# Patient Record
Sex: Male | Born: 1955
Health system: Southern US, Community
[De-identification: ages and names within clinical notes are randomized; demographics above are authoritative.]

## PROBLEM LIST (undated history)

## (undated) DIAGNOSIS — I4892 Unspecified atrial flutter: Secondary | ICD-10-CM

## (undated) DIAGNOSIS — N2581 Secondary hyperparathyroidism of renal origin: Secondary | ICD-10-CM

## (undated) DIAGNOSIS — I4891 Unspecified atrial fibrillation: Secondary | ICD-10-CM

## (undated) DIAGNOSIS — N186 End stage renal disease: Secondary | ICD-10-CM

## (undated) DIAGNOSIS — E785 Hyperlipidemia, unspecified: Secondary | ICD-10-CM

## (undated) DIAGNOSIS — I071 Rheumatic tricuspid insufficiency: Secondary | ICD-10-CM

## (undated) DIAGNOSIS — I10 Essential (primary) hypertension: Secondary | ICD-10-CM

## (undated) DIAGNOSIS — I70223 Atherosclerosis of native arteries of extremities with rest pain, bilateral legs: Secondary | ICD-10-CM

## (undated) DIAGNOSIS — I480 Paroxysmal atrial fibrillation: Secondary | ICD-10-CM

## (undated) DIAGNOSIS — I00 Rheumatic fever without heart involvement: Secondary | ICD-10-CM

## (undated) DIAGNOSIS — K2270 Barrett's esophagus without dysplasia: Secondary | ICD-10-CM

## (undated) DIAGNOSIS — Z992 Dependence on renal dialysis: Secondary | ICD-10-CM

## (undated) DIAGNOSIS — N189 Chronic kidney disease, unspecified: Secondary | ICD-10-CM

## (undated) DIAGNOSIS — N051 Unspecified nephritic syndrome with focal and segmental glomerular lesions: Secondary | ICD-10-CM

## (undated) DIAGNOSIS — I739 Peripheral vascular disease, unspecified: Secondary | ICD-10-CM

## (undated) DIAGNOSIS — Z89611 Acquired absence of right leg above knee: Secondary | ICD-10-CM

## (undated) DIAGNOSIS — I340 Nonrheumatic mitral (valve) insufficiency: Secondary | ICD-10-CM

## (undated) HISTORY — PX: AV FISTULA PLACEMENT: SHX1204

## (undated) HISTORY — PX: OTHER SURGICAL HISTORY: SHX169

## (undated) HISTORY — PX: DG AV DIALYSIS GRAFT DECLOT OR: HXRAD813

## (undated) HISTORY — PX: FLEXIBLE BRONCHOSCOPY: SUR164

---

## 1983-08-10 HISTORY — PX: KIDNEY TRANSPLANT: SHX239

## 2004-12-30 ENCOUNTER — Ambulatory Visit: Payer: Self-pay

## 2005-01-05 ENCOUNTER — Ambulatory Visit: Payer: Self-pay | Admitting: Nephrology

## 2005-03-29 ENCOUNTER — Emergency Department: Payer: Self-pay | Admitting: Emergency Medicine

## 2005-04-19 ENCOUNTER — Ambulatory Visit: Payer: Self-pay | Admitting: Nephrology

## 2005-04-22 ENCOUNTER — Ambulatory Visit: Payer: Self-pay | Admitting: Nephrology

## 2005-11-30 ENCOUNTER — Ambulatory Visit: Payer: Self-pay | Admitting: Vascular Surgery

## 2006-03-07 ENCOUNTER — Ambulatory Visit: Payer: Self-pay | Admitting: Vascular Surgery

## 2006-03-07 IMAGING — XA DG OUTSIDE FILMS CHEST
6 series · 15 of 24 positions shown · non-contrast
Comparison: none

[Series 1: run · 3 of 18 slices shown (1 of 6)]
[im 1/18]
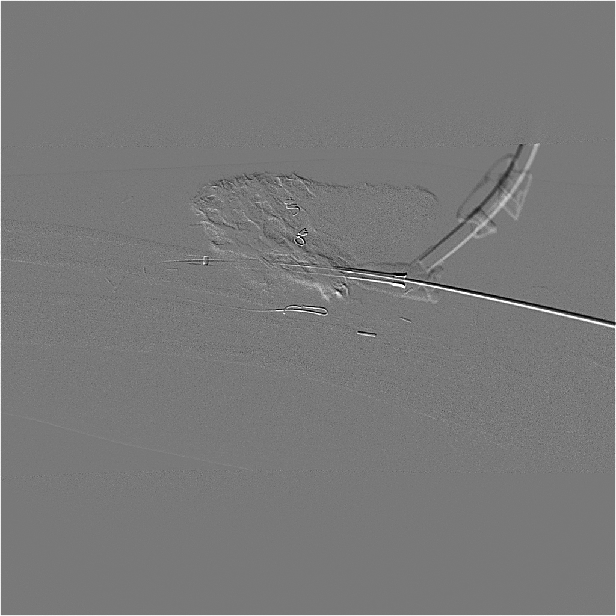
[im 9/18]
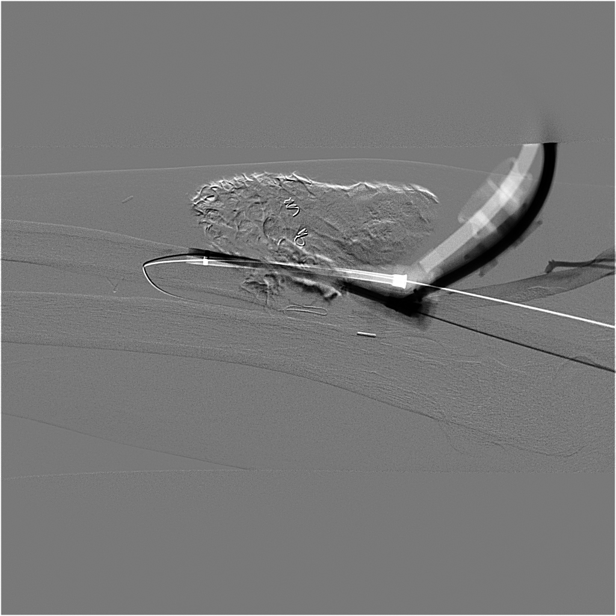
[im 18/18]
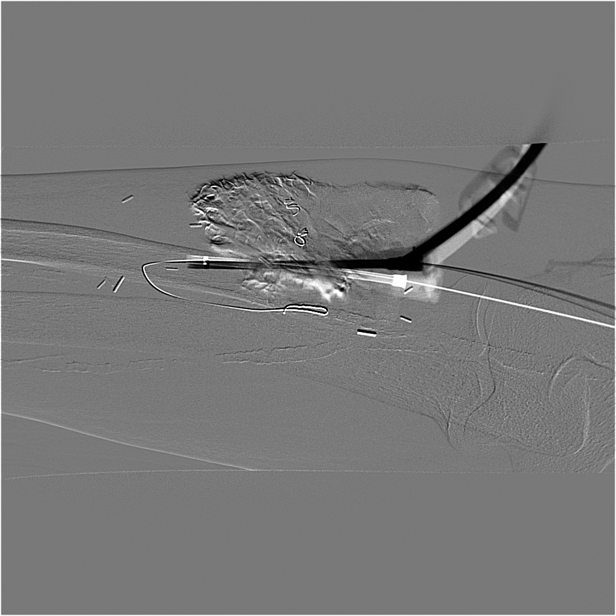

[Series 2: run · 3 of 19 slices shown (2 of 6)]
[im 1/19]
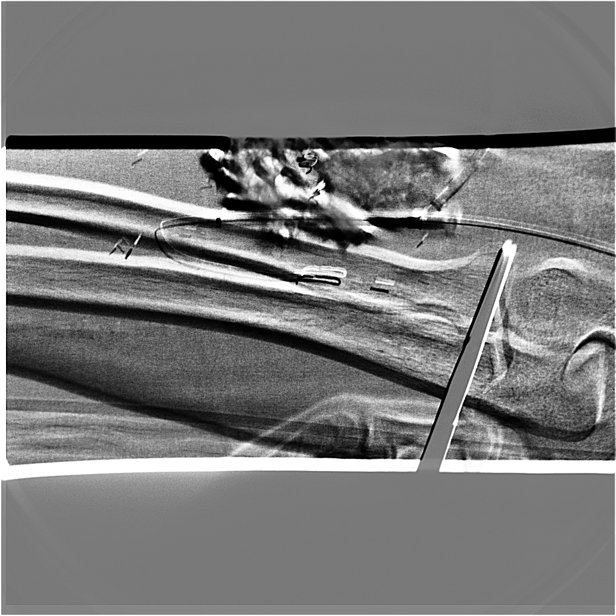
[im 13/19]
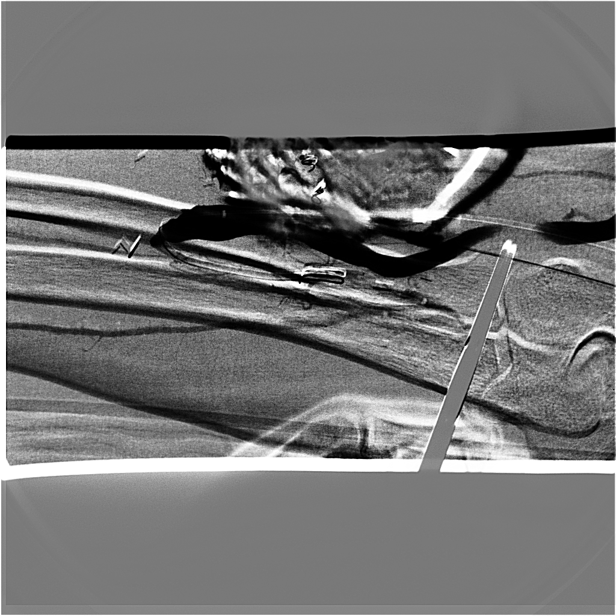
[im 19/19]
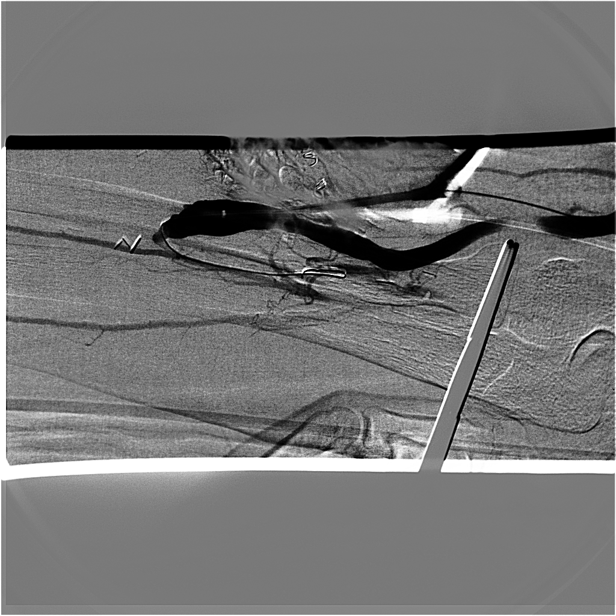

[Series 3: run · 1 of 9 slices shown (3 of 6)]
[im 9/9]
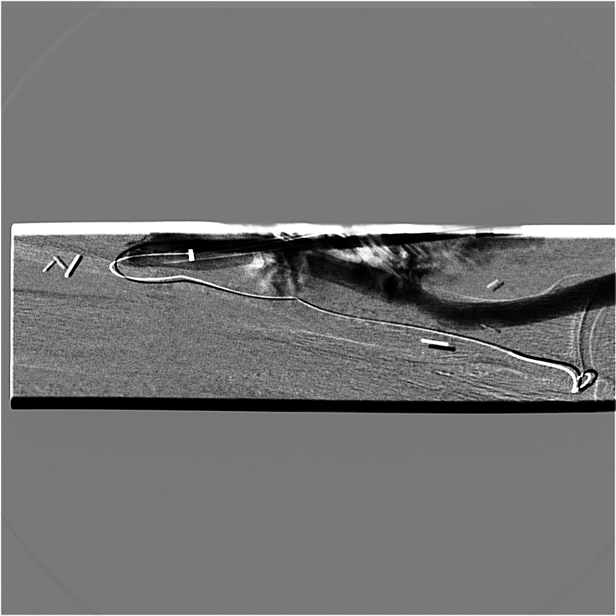

[Series 4: run · 1 of 10 slices shown (4 of 6)]
[im 10/10]
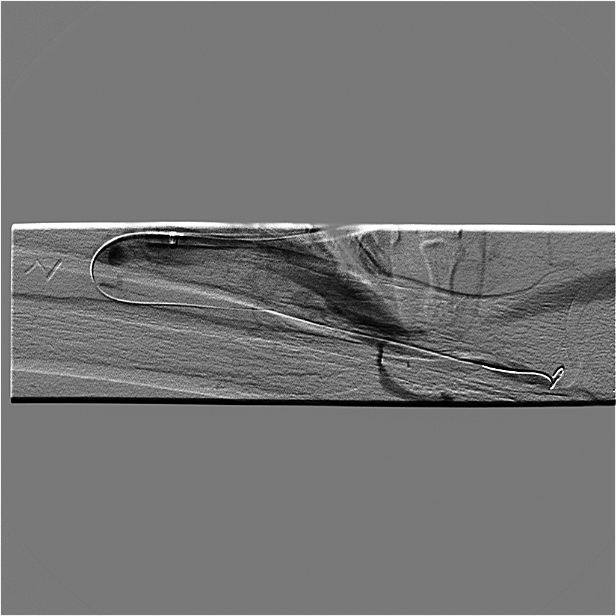

[Series 5: run · 2 of 14 slices shown (5 of 6)]
[im 1/14]
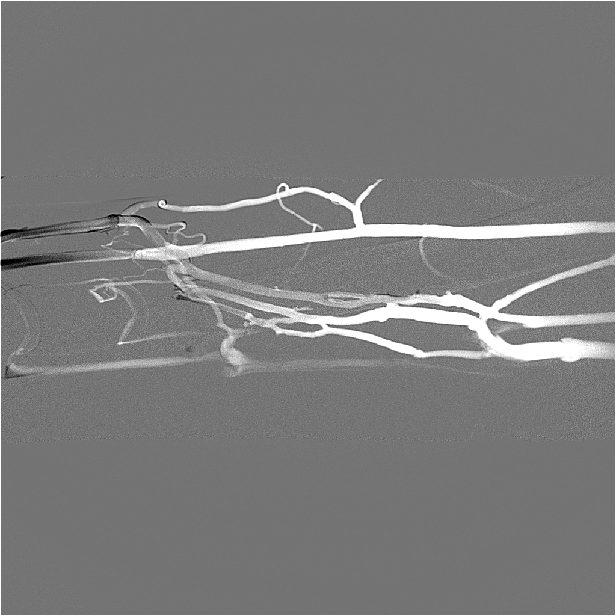
[im 14/14]
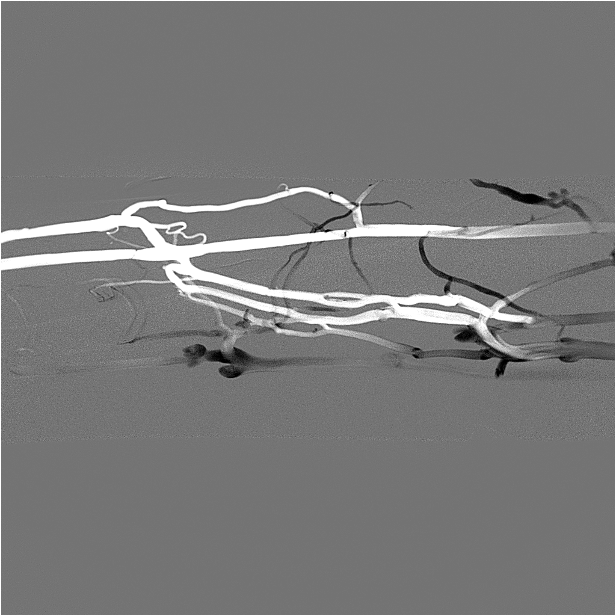

[Series 6: run · 5 of 33 slices shown (6 of 6)]
[im 1/33]
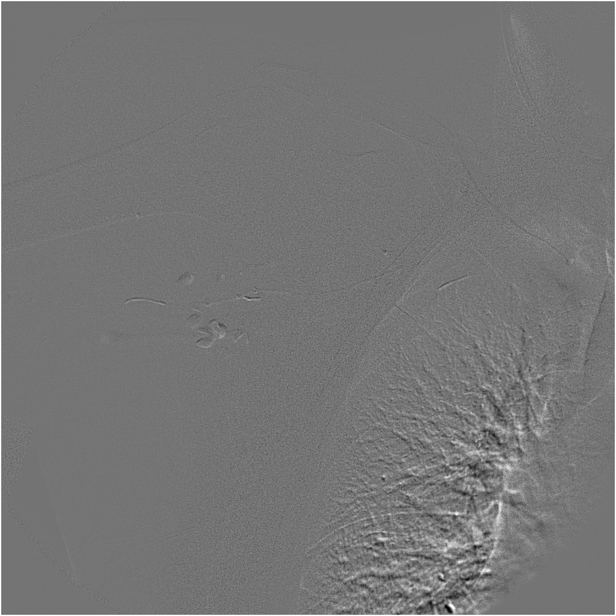
[im 10/33]
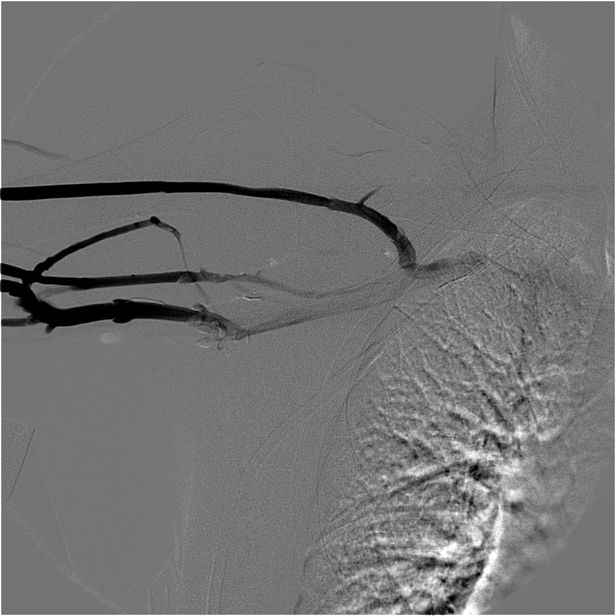
[im 19/33]
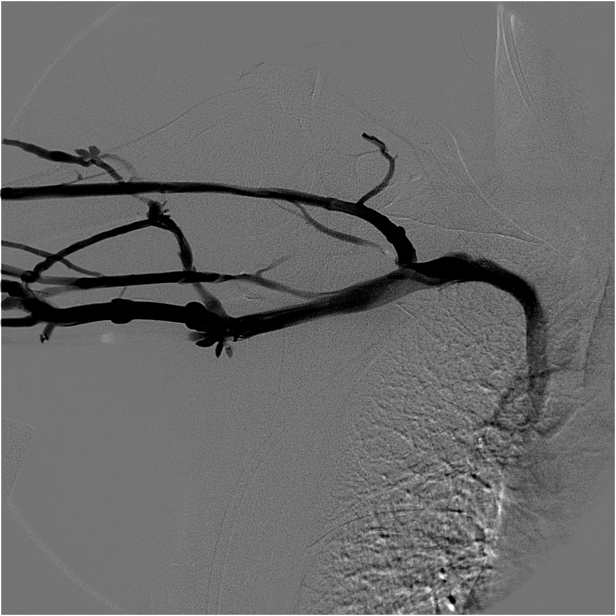
[im 23/33]
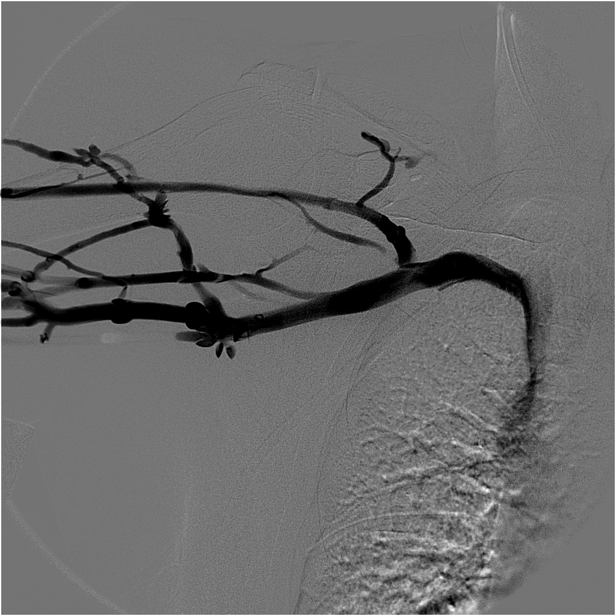
[im 33/33]
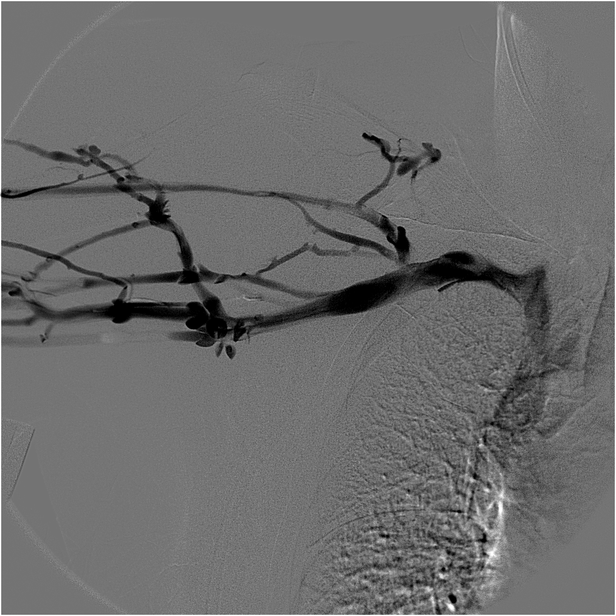

[15 of 24 positions shown; findings below may reference images not displayed]

**** An original report or order could not be provided from the [HOSPITAL] [facility] ****

## 2007-02-20 ENCOUNTER — Ambulatory Visit: Payer: Self-pay | Admitting: Vascular Surgery

## 2007-02-20 IMAGING — XA DG OUTSIDE FILMS CHEST
12 of 13 series · 15 of 24 positions shown · non-contrast
Comparison: none

[Series 1: run · 2 of 38 slices shown (1 of 12)]
[im 1/38]
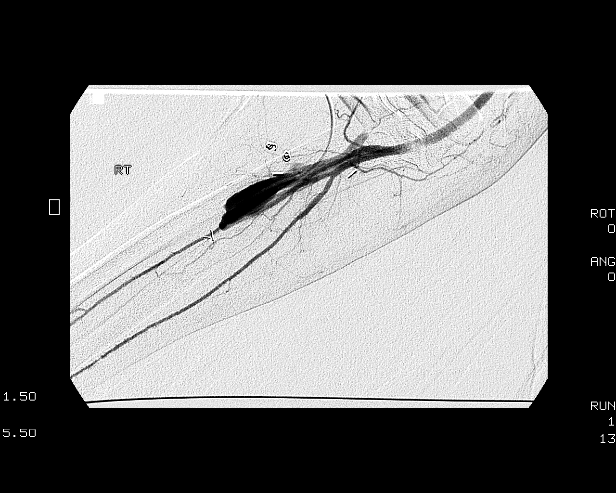
[im 25/38]
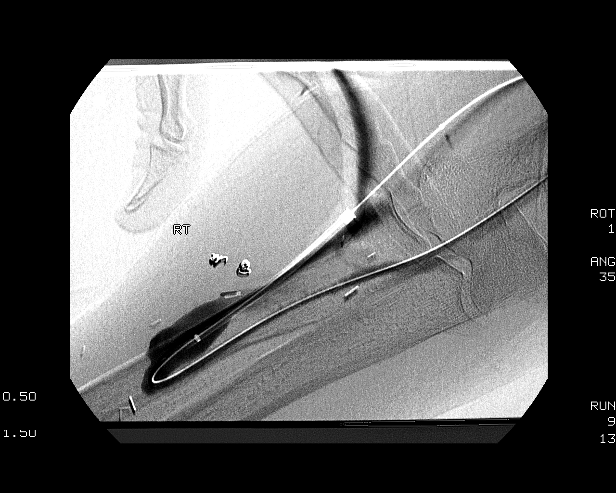

[Series 1: run · 1 of 13 slices shown (2 of 12)]
[im 1/13]
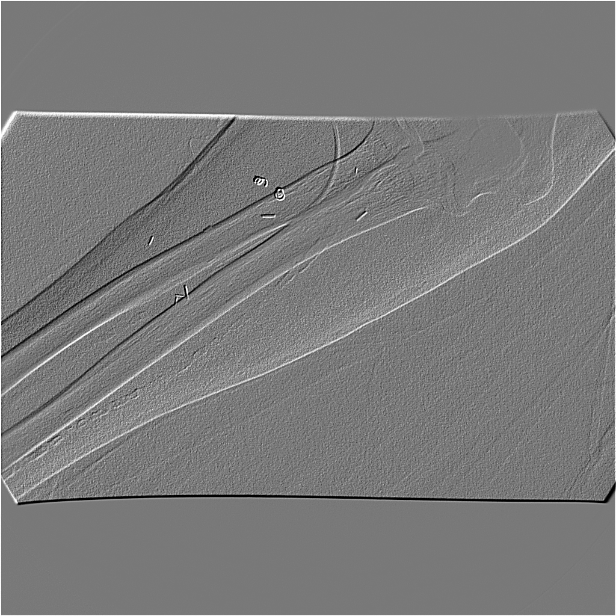

[Series 2: run · 1 of 19 slices shown (3 of 12)]
[im 1/19]
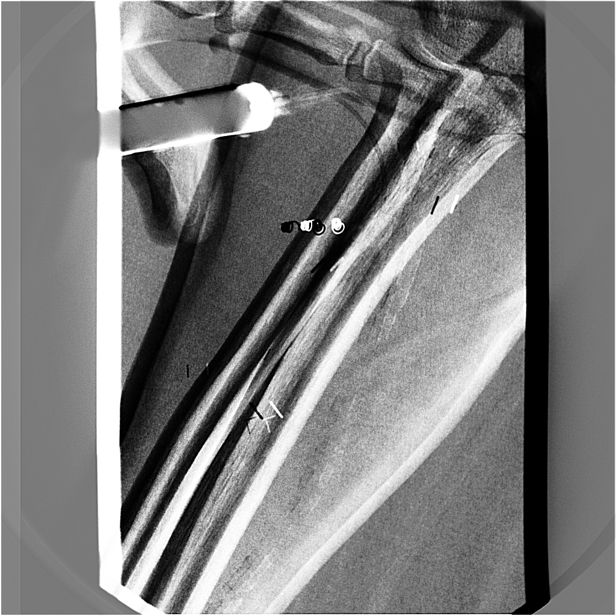

[Series 3: run · 1 of 12 slices shown (4 of 12)]
[im 12/12]
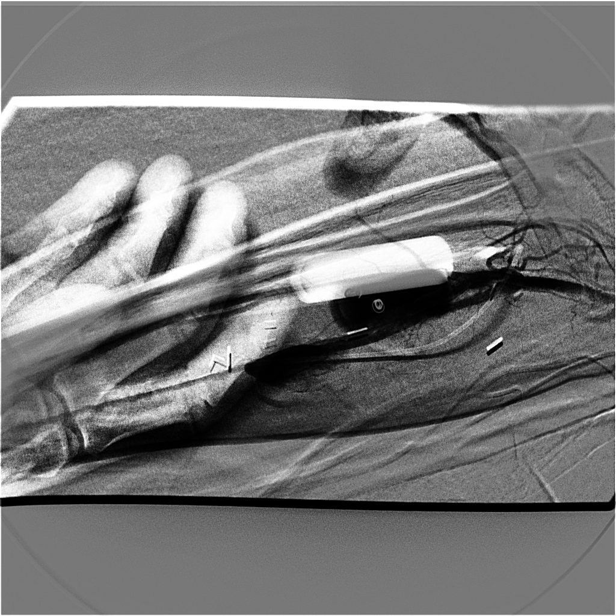

[Series 4: run · 1 of 15 slices shown (5 of 12)]
[im 1/15]
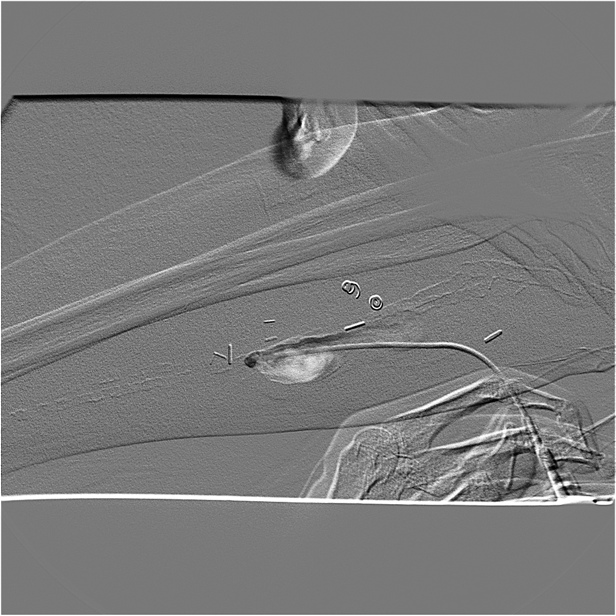

[Series 5: run · 1 of 16 slices shown (6 of 12)]
[im 1/16]
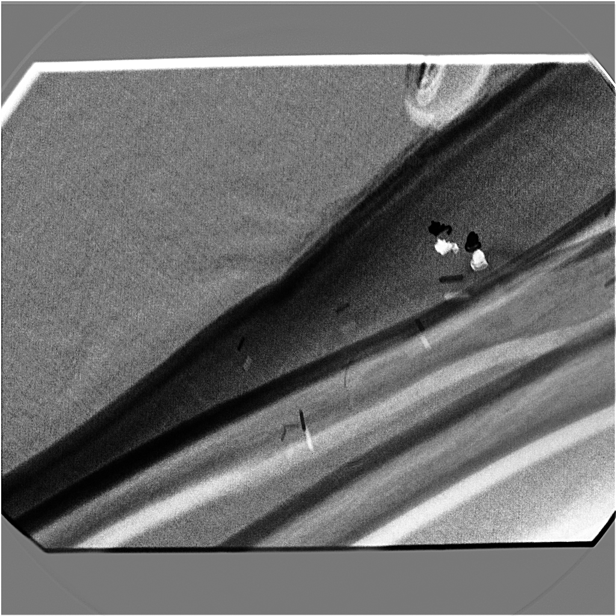

[Series 6: run · 2 of 12 slices shown (7 of 12)]
[im 1/12]
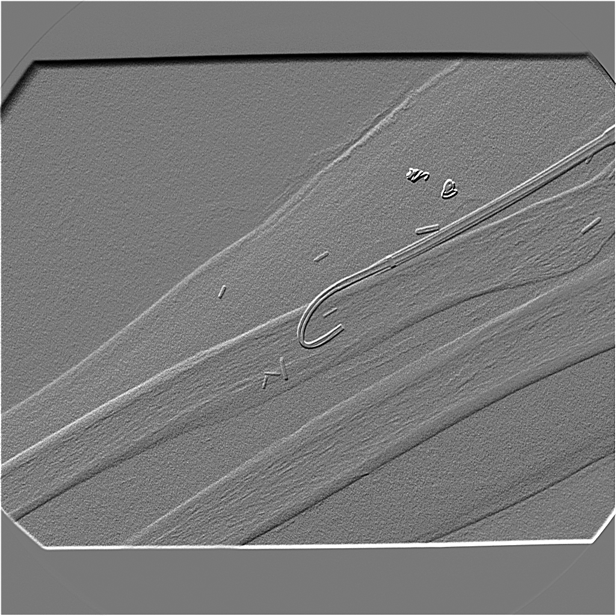
[im 12/12]
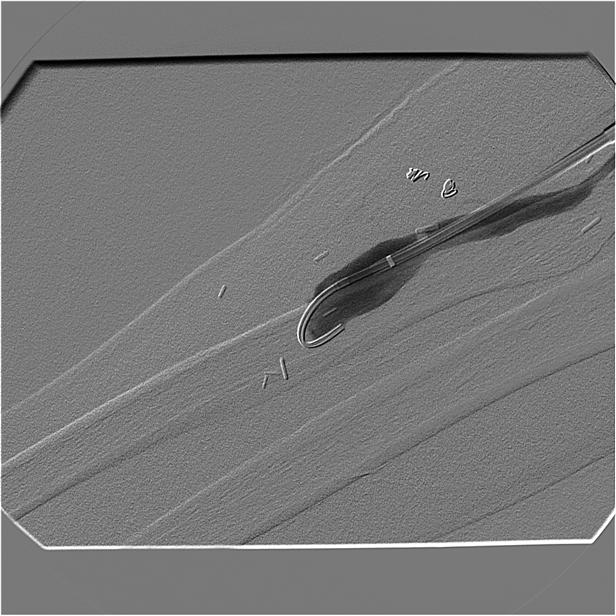

[Series 8: run · 1 of 1 slices shown (8 of 12)]
[im 1/1]
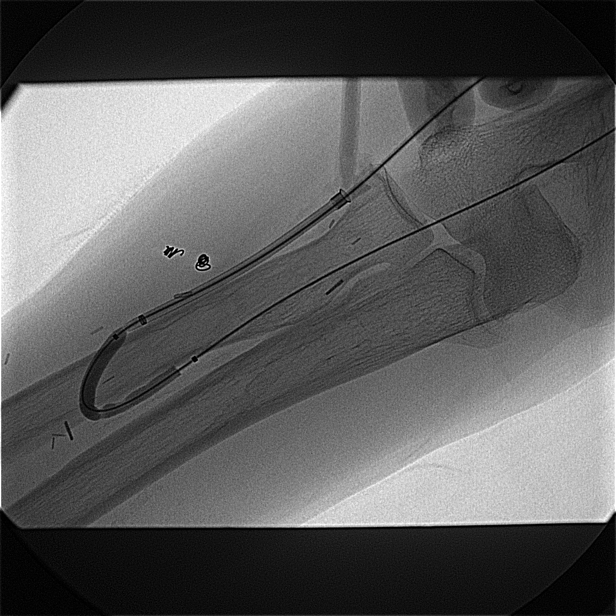

[Series 9: run · 1 of 13 slices shown (9 of 12)]
[im 1/13]
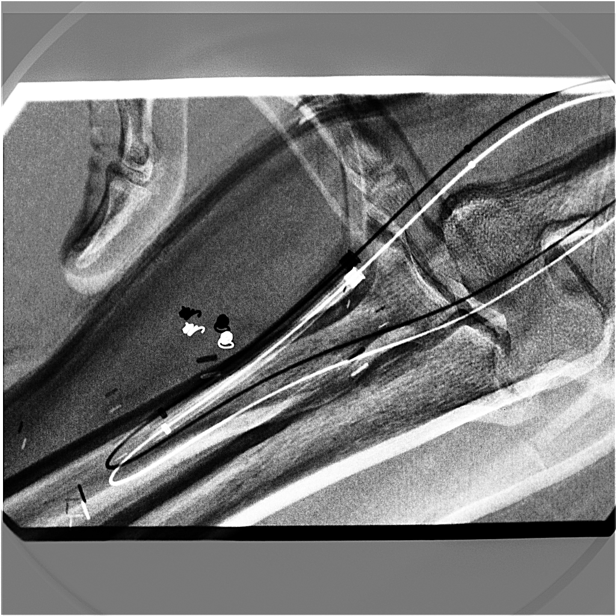

[Series 10: run · 1 of 12 slices shown (10 of 12)]
[im 1/12]
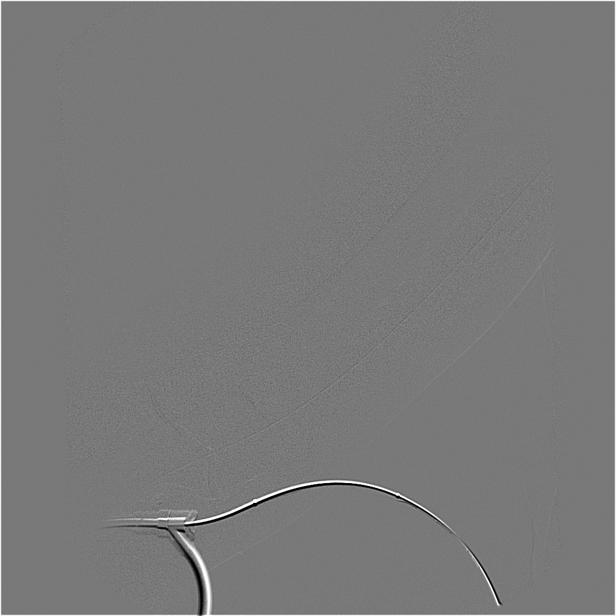

[Series 11: run · 2 of 15 slices shown (11 of 12)]
[im 1/15]
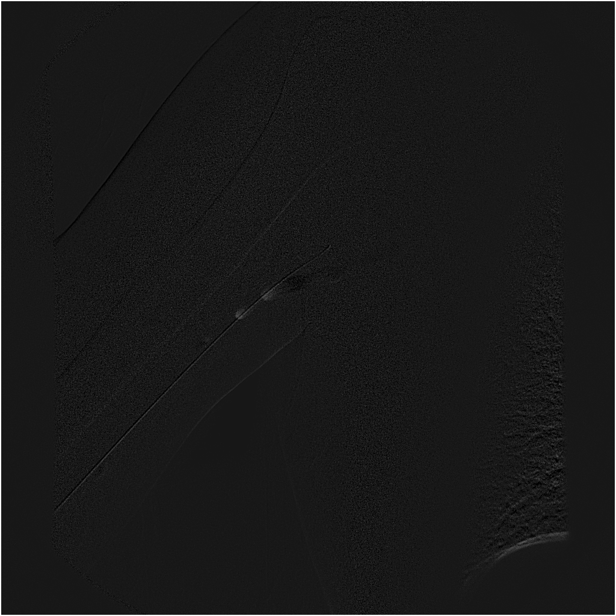
[im 15/15]
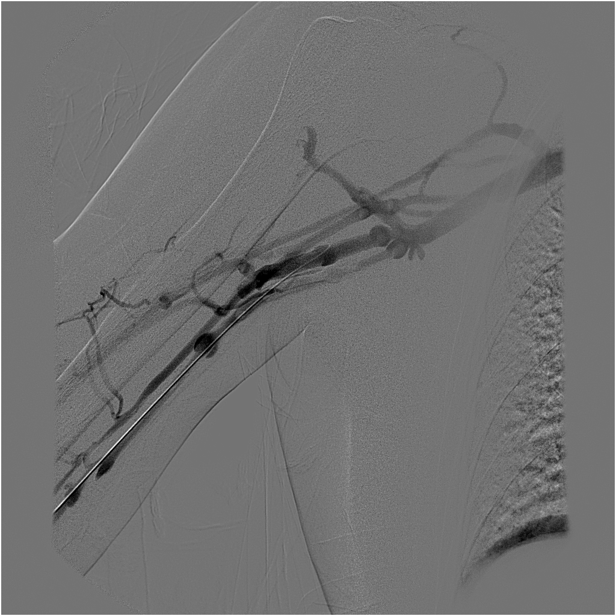

[Series 12: run · 1 of 17 slices shown (12 of 12)]
[im 17/17]
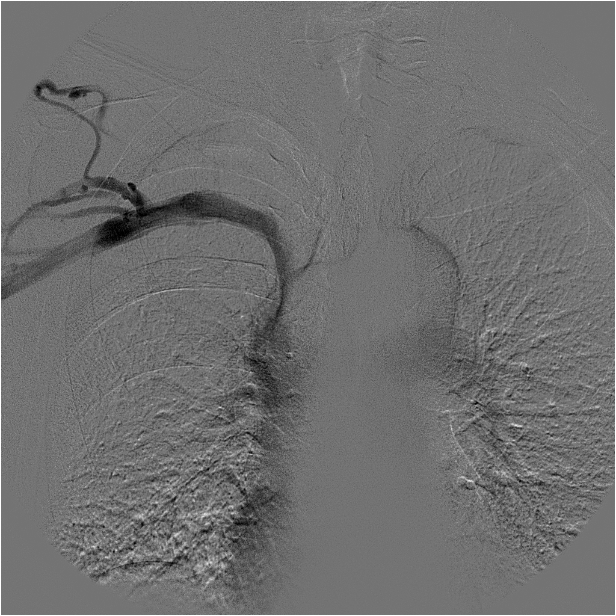

[15 of 24 positions shown; findings below may reference images not displayed]

**** An original report or order could not be provided from the [HOSPITAL] [facility] ****

## 2007-02-22 ENCOUNTER — Ambulatory Visit: Payer: Self-pay | Admitting: Vascular Surgery

## 2007-02-24 ENCOUNTER — Other Ambulatory Visit: Payer: Self-pay

## 2007-02-24 ENCOUNTER — Emergency Department: Payer: Self-pay | Admitting: Emergency Medicine

## 2007-02-24 IMAGING — CT CT HEAD WITHOUT CONTRAST
2 series · 16 of 30 positions shown, 20 images · non-contrast
Comparison: none

REASON FOR EXAM: Headache
COMMENTS:

[Series 2: without · axial · non-contrast · 0.40mm/px · z∈[+127,+252]mm · 13 of 31 slices shown, 17 images]
[im 3/31  brain]
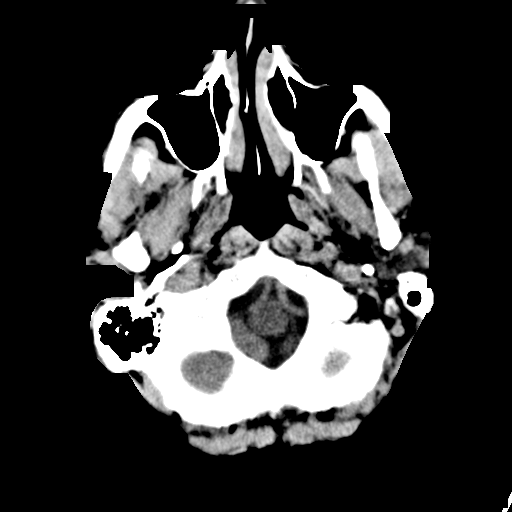
[im 3/31  bone]
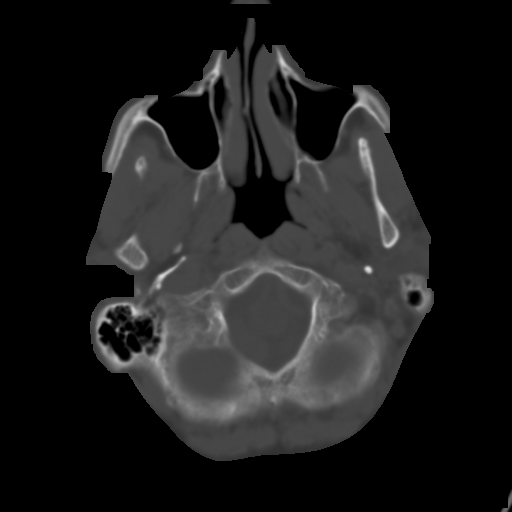
[im 5/31  brain]
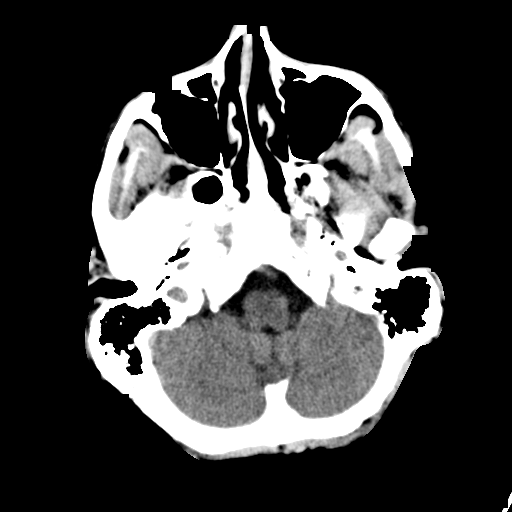
[im 7/31  brain]
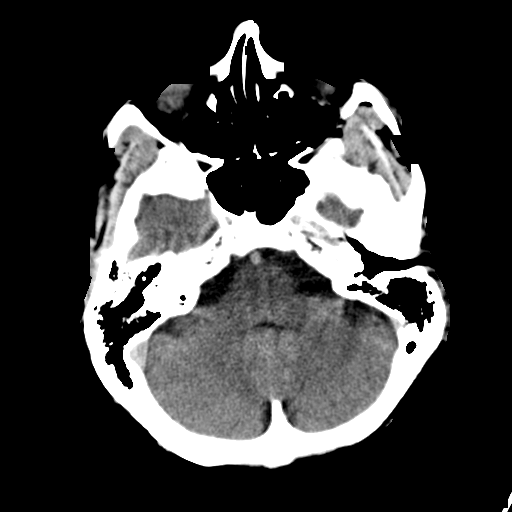
[im 9/31  brain]
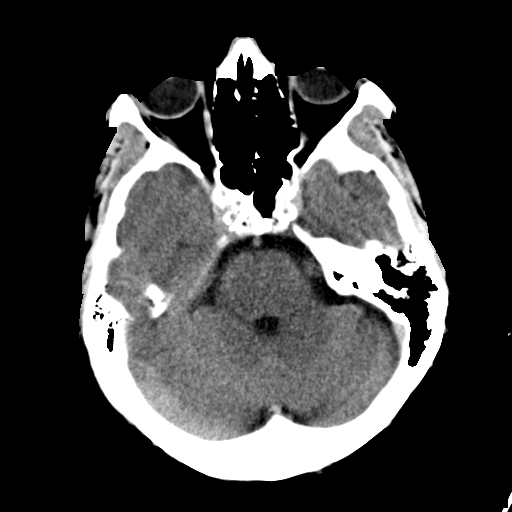
[im 11/31  brain]
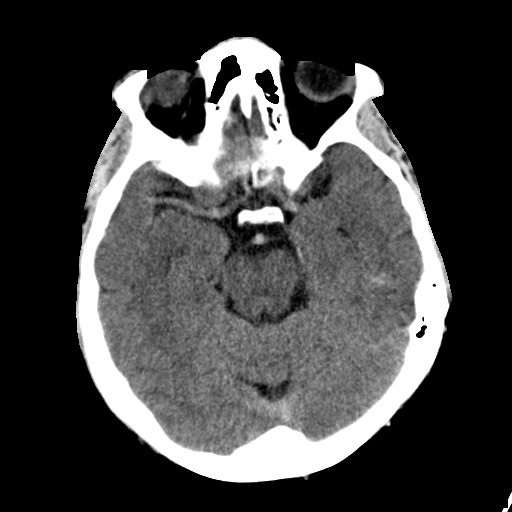
[im 11/31  bone]
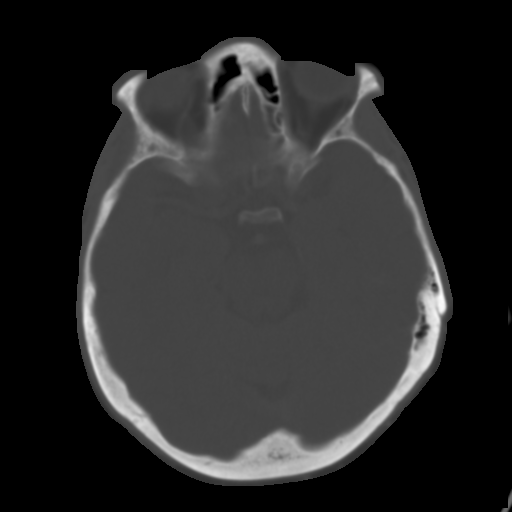
[im 13/31  brain]
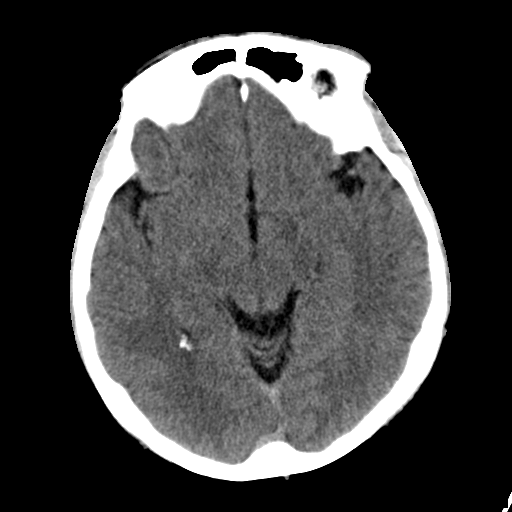
[im 16/31  brain]
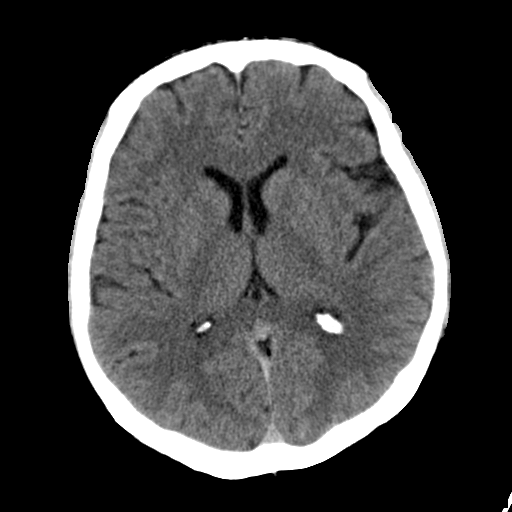
[im 18/31  brain]
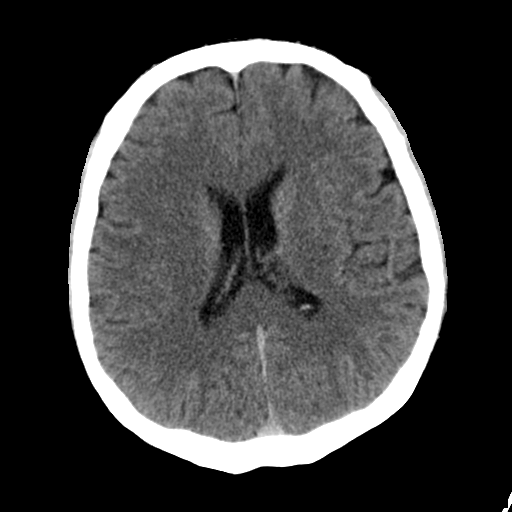
[im 20/31  brain]
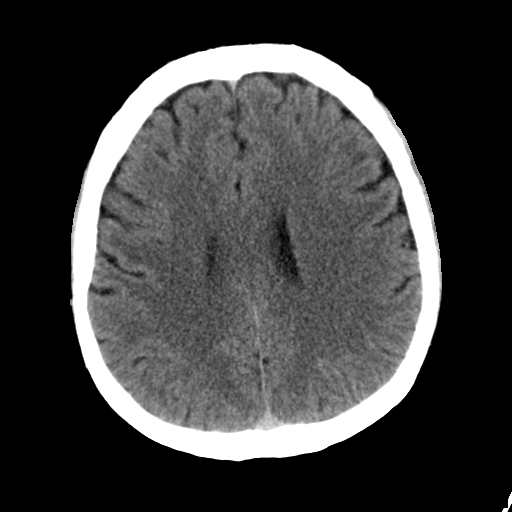
[im 20/31  bone]
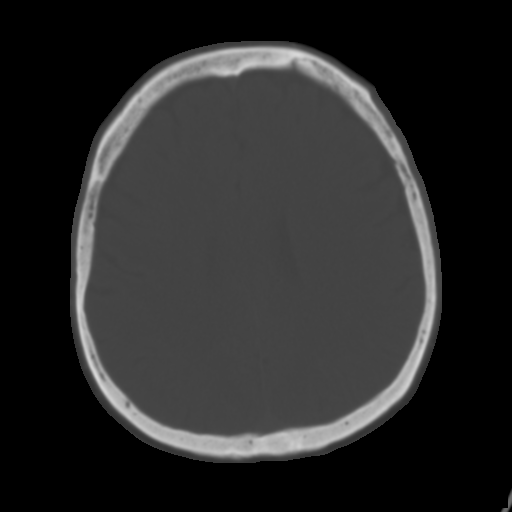
[im 22/31  brain]
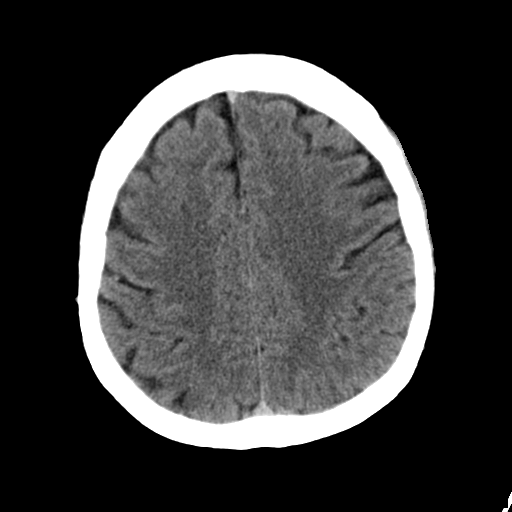
[im 24/31  brain]
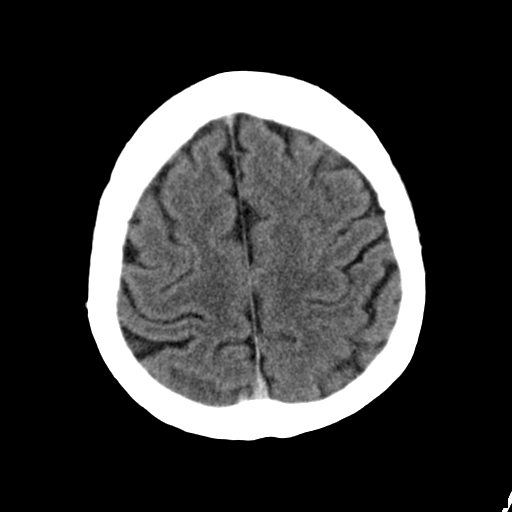
[im 26/31  brain]
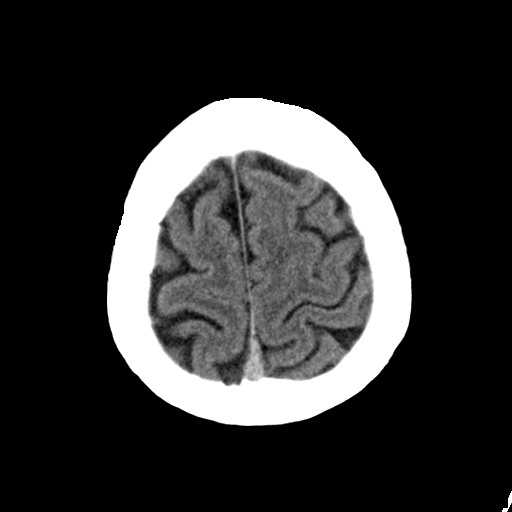
[im 28/31  brain]
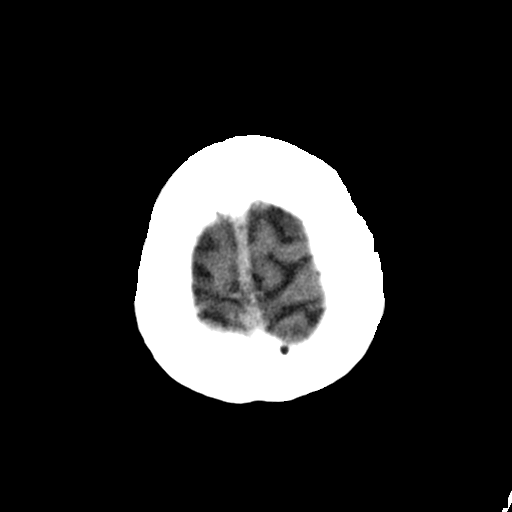
[im 28/31  bone]
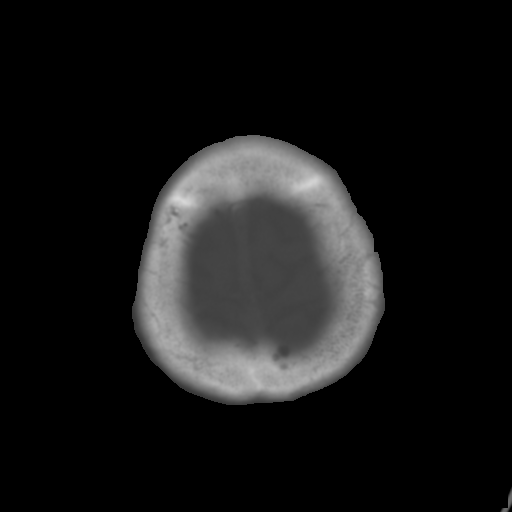

[Series 3: bone · axial · 0.40mm/px · z∈[+127,+167]mm · 3 of 31 slices shown]
[im 3/31  bone]
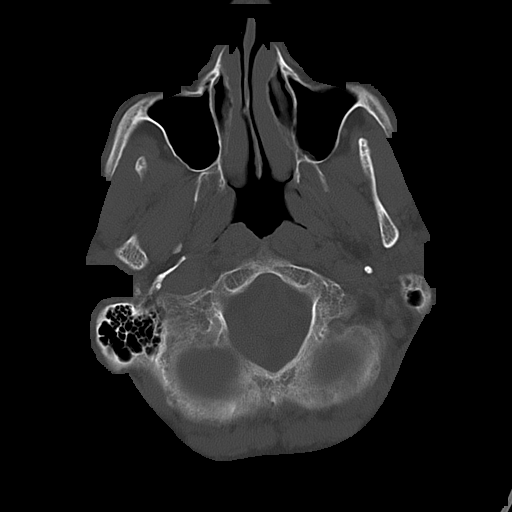
[im 7/31  bone]
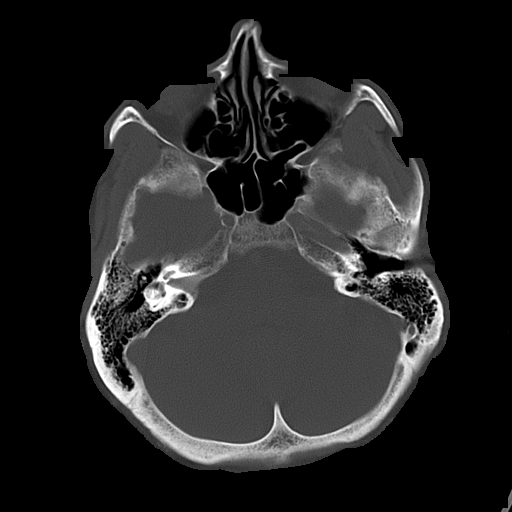
[im 11/31  bone]
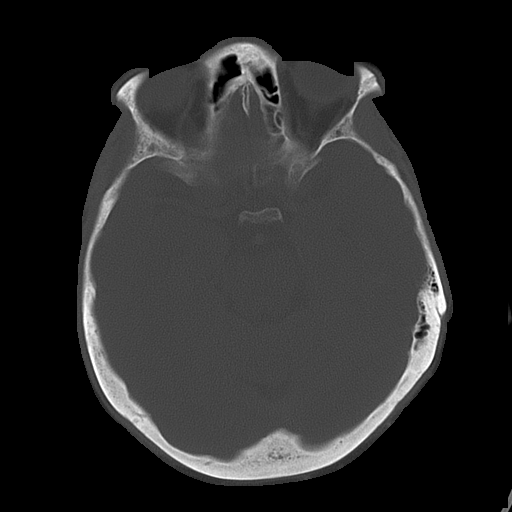

[16 of 30 positions shown; findings below may reference images not displayed]

PROCEDURE:     CT  - CT HEAD WITHOUT CONTRAST  - [DATE]  [DATE]

RESULT:     There is no evidence of intra-axial or extra-axial fluid
collection or evidence of acute hemorrhage. No secondary signs are
appreciated to suggest mass effect or subacute or chronic infarction.

The visualized bony skeleton evaluated with bone windowing demonstrates no
evidence of fracture or dislocation.
IMPRESSION: 1.     Unremarkable head CT.
2.     Dr. YAAR MOHAMMAD of the Emergency Department was informed of these
findings at the time of the initial interpretation.

## 2007-04-05 ENCOUNTER — Ambulatory Visit: Payer: Self-pay | Admitting: Vascular Surgery

## 2007-04-11 ENCOUNTER — Ambulatory Visit: Payer: Self-pay | Admitting: Vascular Surgery

## 2007-06-20 ENCOUNTER — Ambulatory Visit: Payer: Self-pay | Admitting: Nephrology

## 2007-08-19 ENCOUNTER — Emergency Department: Payer: Self-pay | Admitting: Emergency Medicine

## 2007-08-19 IMAGING — CR DG ELBOW COMPLETE 3+V*L*
1 series · 5 of 5 positions shown · non-contrast
Comparison: none

REASON FOR EXAM: fall, pain
COMMENTS:

PROCEDURE:     DXR - DXR ELBOW LT COMP W/OBLIQUES  - [DATE] [DATE]
RESULT:     Soft tissue swelling is noted. A radial head fracture cannot be
excluded. There is anterior and posterior fat pad present. Vascular
calcification is present.

[Series 1: view not recorded · 0.17mm/px · 5 of 5 slices shown]
[im 1/5]
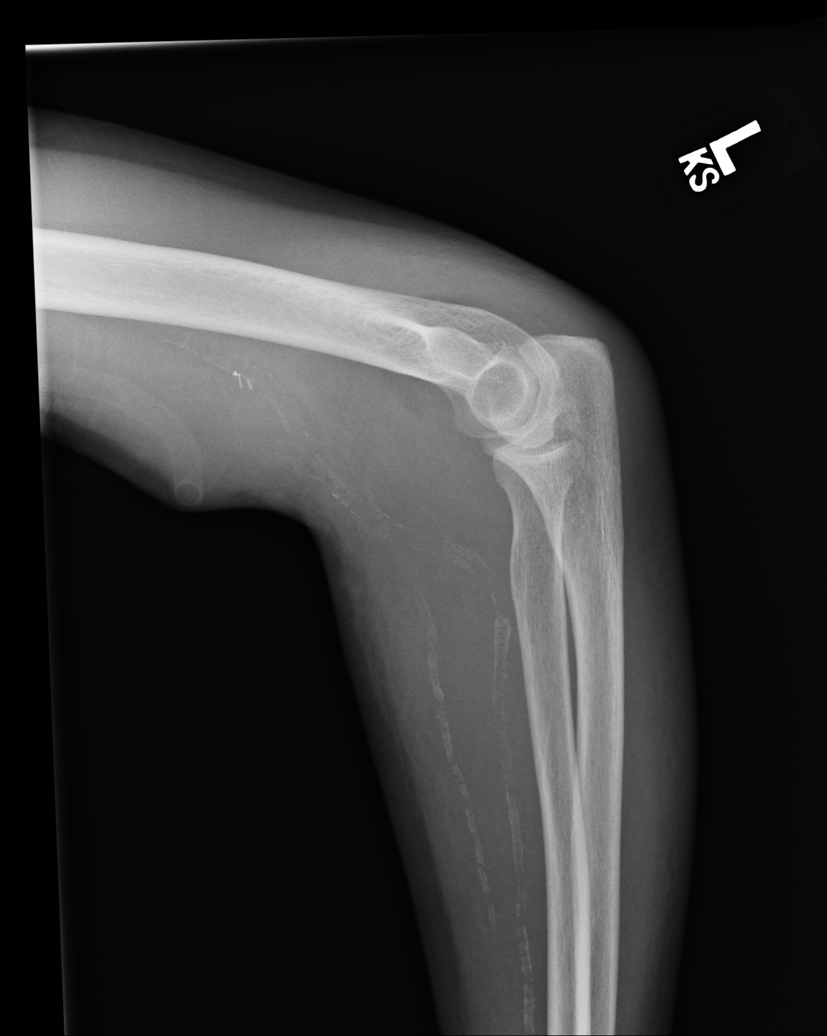
[im 2/5]
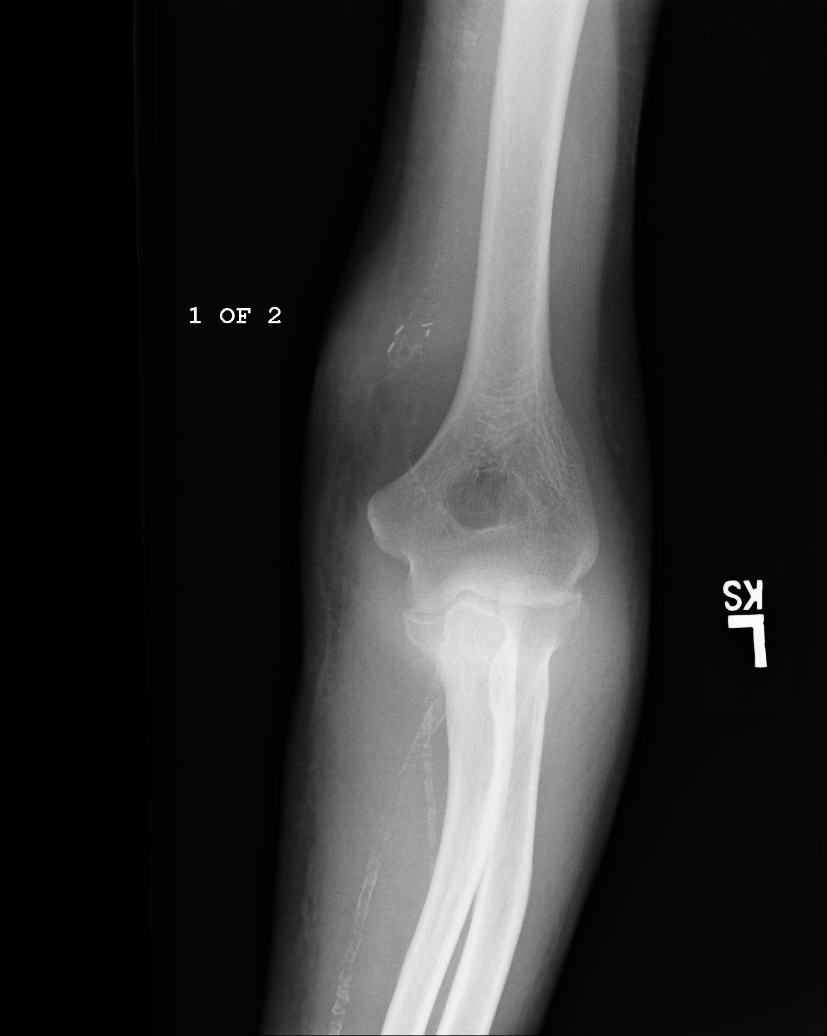
[im 3/5]
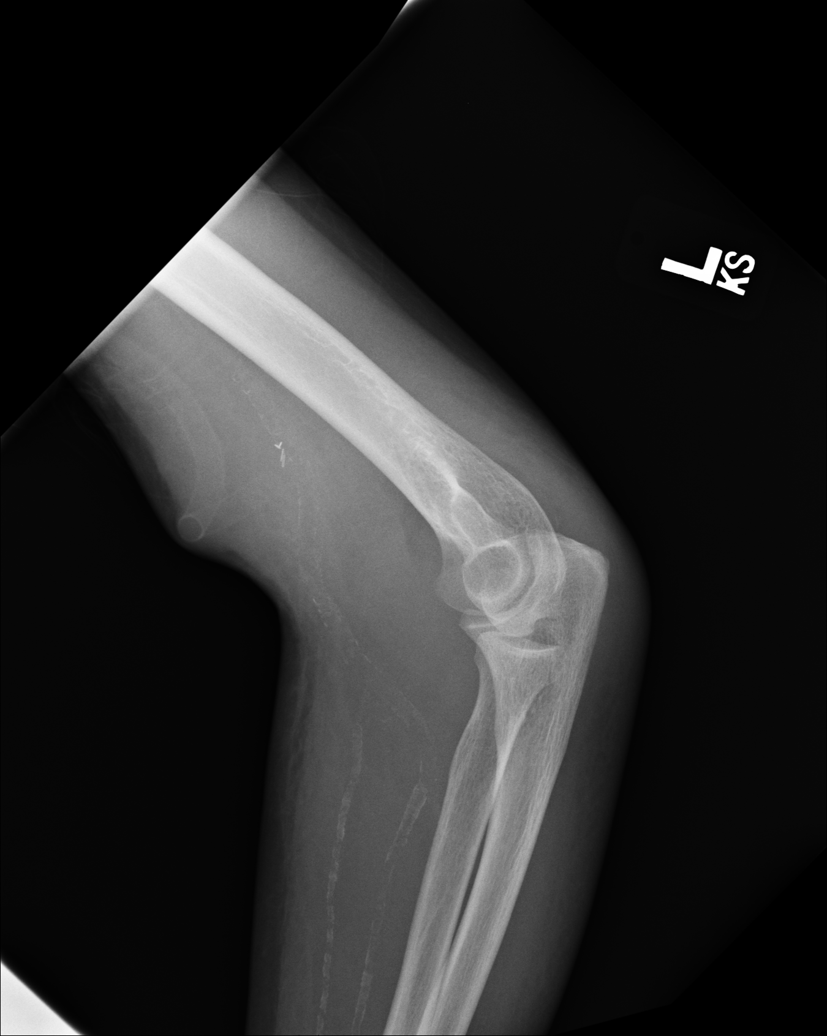
[im 4/5]
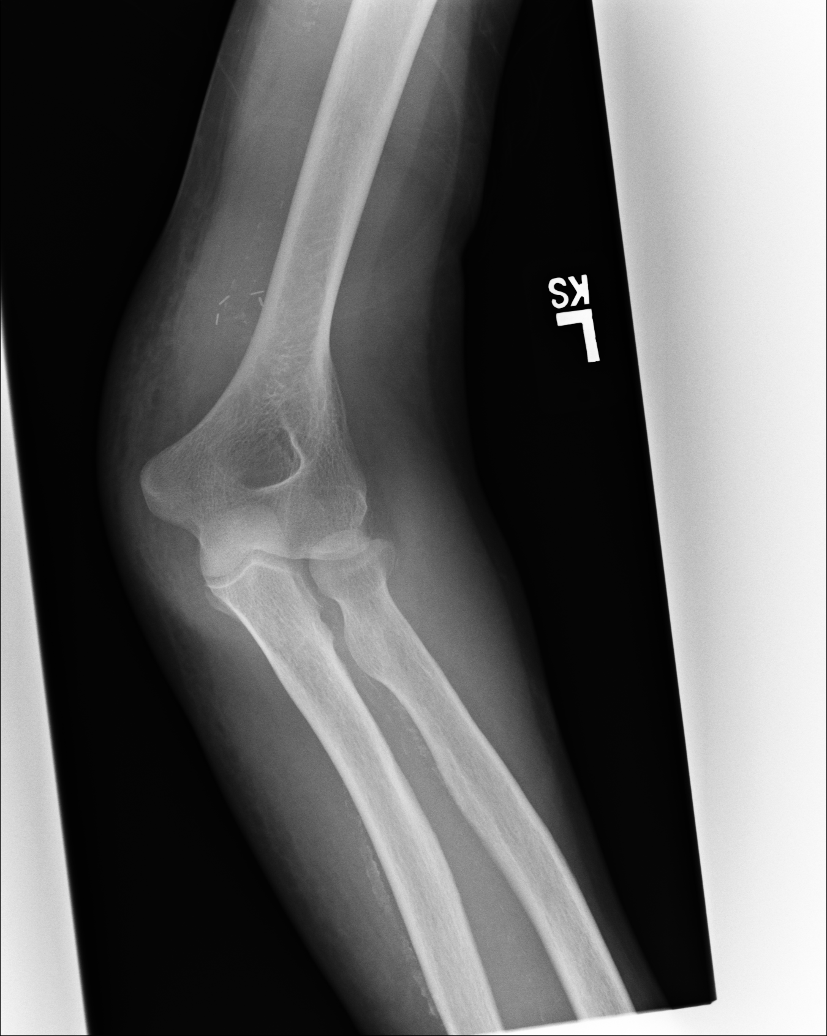
[im 5/5]
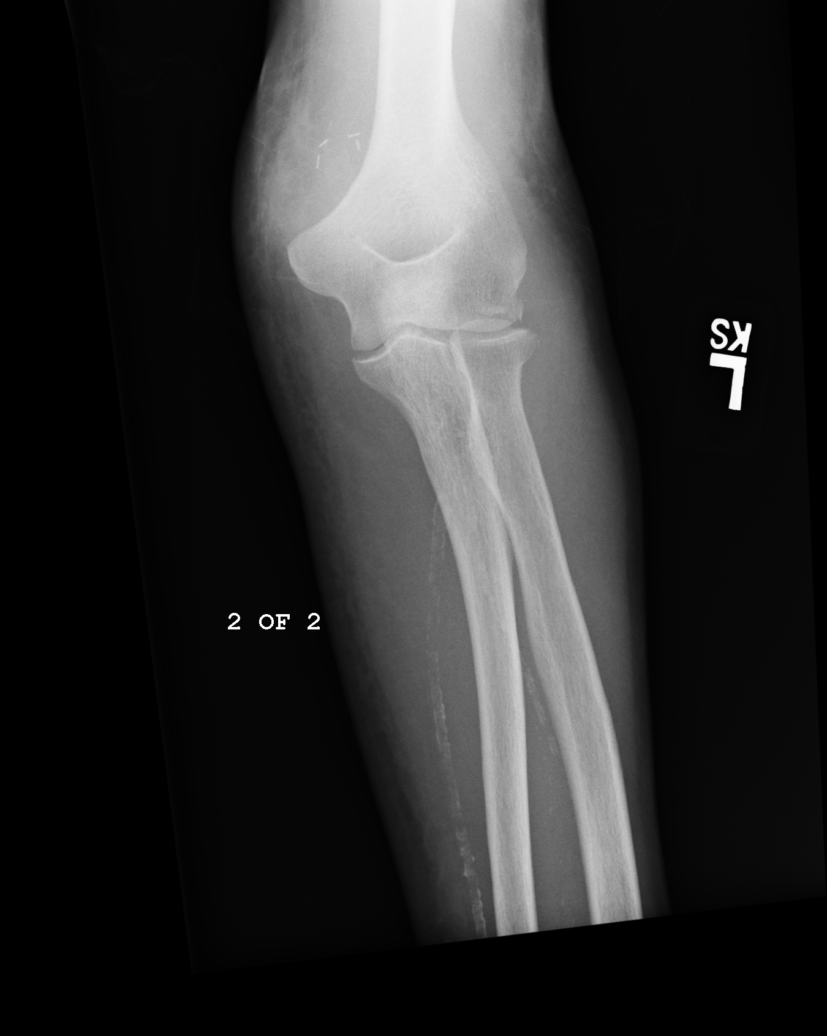

[5 of 5 positions shown; findings below may reference images not displayed]

IMPRESSION: 1.     LEFT elbow joint effusion.
2.     Probable radial head fracture.
3.     Vascular calcification.

This report was phoned to the patient's physician at the time of the study.

## 2008-10-03 ENCOUNTER — Inpatient Hospital Stay: Payer: Self-pay | Admitting: Internal Medicine

## 2008-10-03 IMAGING — CR DG CHEST 1V PORT
1 series · 1 of 1 positions shown · non-contrast
Comparison: none

REASON FOR EXAM: weakness
COMMENTS:

[view not recorded]
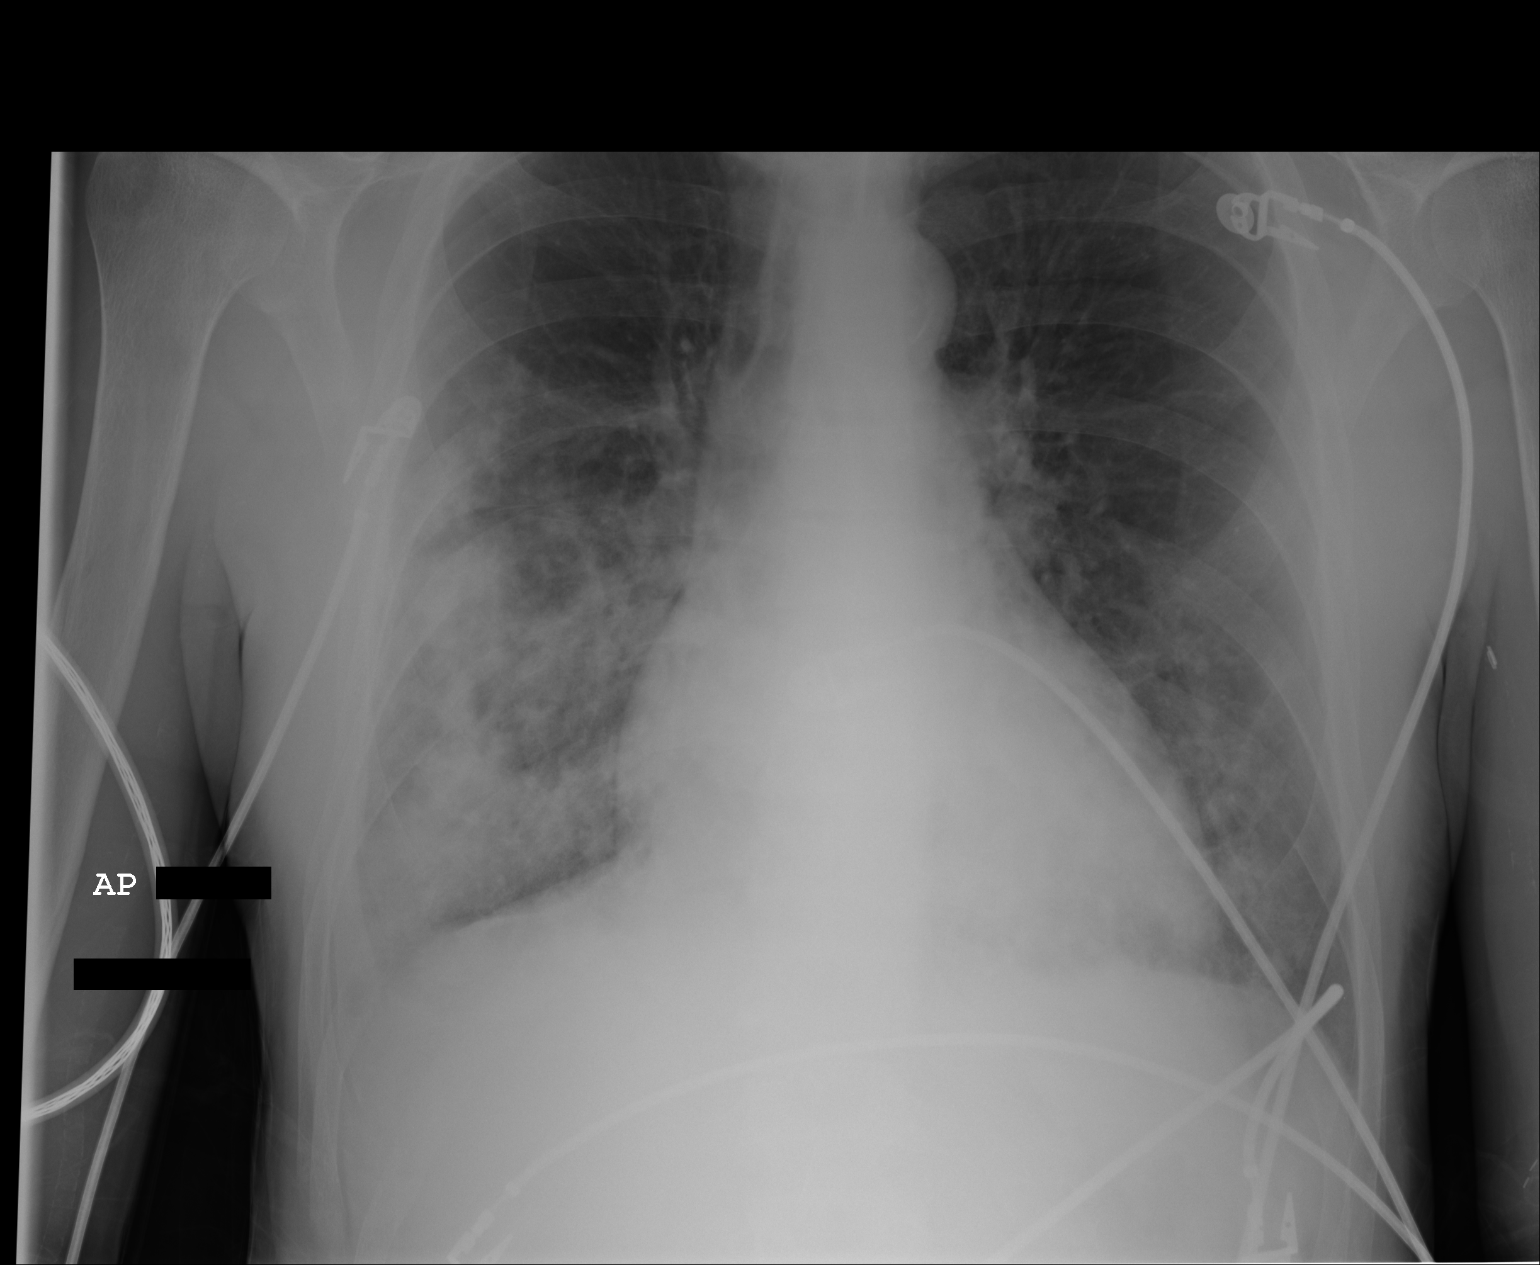

[1 of 1 positions shown; findings below may reference images not displayed]

PROCEDURE:     DXR - DXR PORTABLE CHEST SINGLE VIEW  - [DATE] [DATE]

RESULT:     Portable AP view of the chest shows a diffuse infiltrate on the
RIGHT. Minimal infiltrative changes are also seen at the LEFT base. The
findings are most compatible with bilateral pneumonia. Atypical edema cannot
be totally excluded but is considered less likely. The heart is mildly
enlarged. No pleural effusion or pneumothorax is seen. Monitoring electrodes
are present.
IMPRESSION: 1. There are bilateral lower lobe pulmonary infiltrates, compatible with
pneumonia and more prominent on the RIGHT.
2. Cardiomegaly.

## 2009-04-16 ENCOUNTER — Ambulatory Visit: Payer: Self-pay | Admitting: Internal Medicine

## 2009-04-16 IMAGING — US ULTRASOUND RIGHT BREAST
1 series · 11 of 11 positions shown · non-contrast
Comparison: none

REASON FOR EXAM: right breast mass
COMMENTS:

PROCEDURE:     US  - US BREAST RIGHT  - [DATE]  [DATE]
RESULT:     Retroareolar parenchymal prominence is noted on the right. There
is echotexture distortion. Malignancy cannot be excluded. Surgical
consultation is suggested.

[Series 1: ultrasound right breast · 11 of 11 slices shown]
[im 1/11]
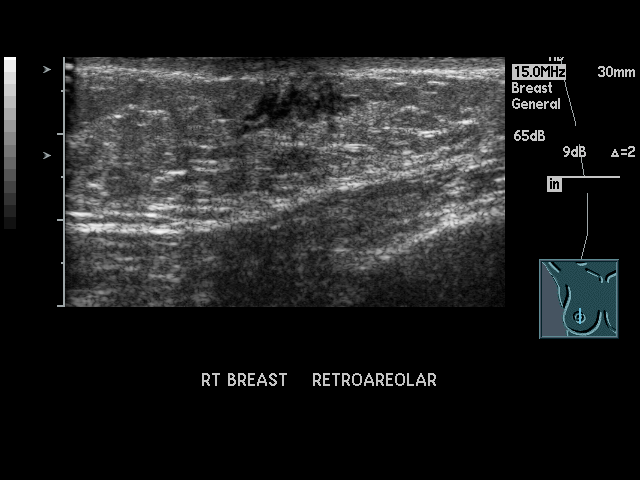
[im 2/11]
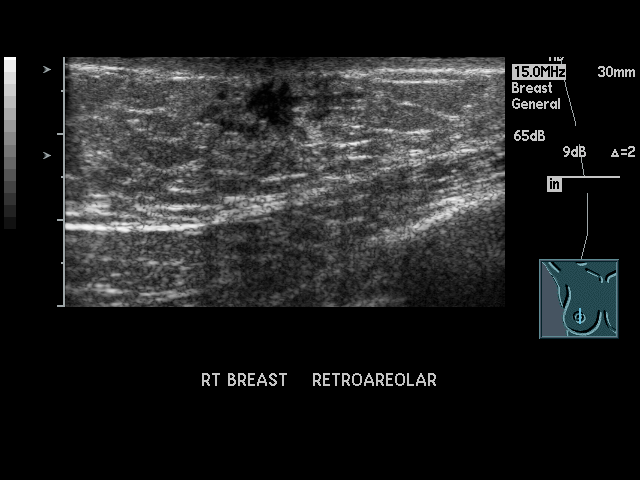
[im 3/11]
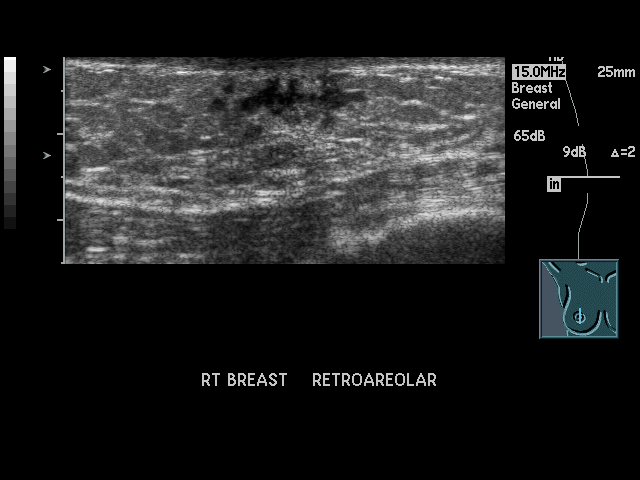
[im 4/11]
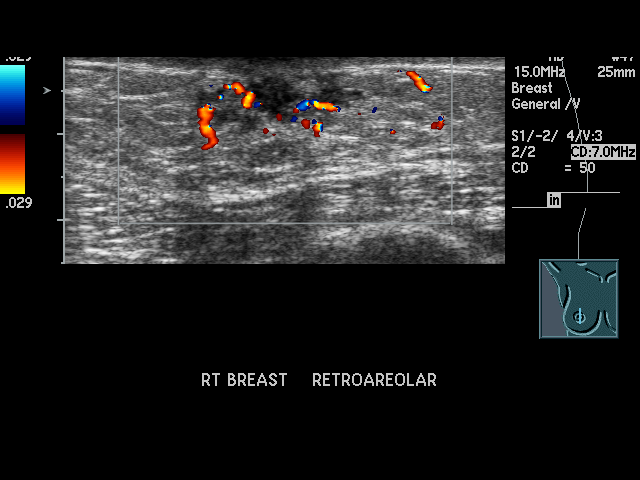
[im 5/11]
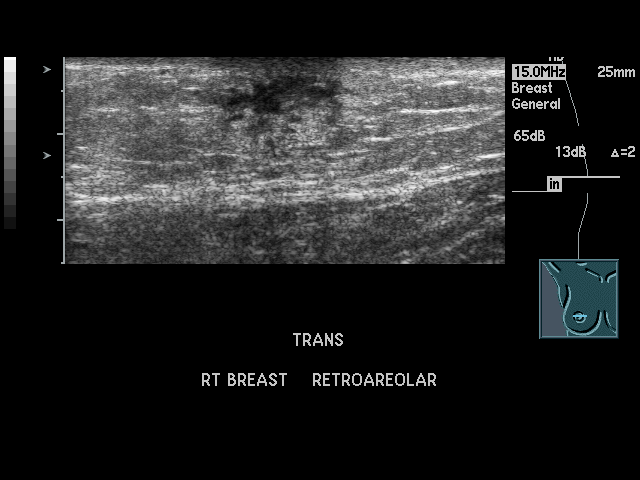
[im 6/11]
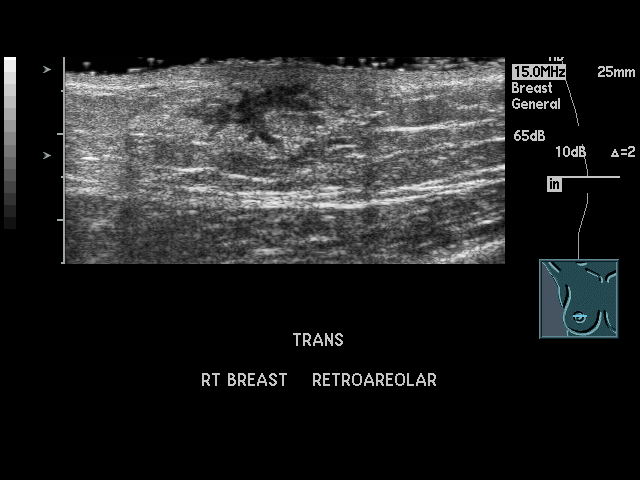
[im 7/11]
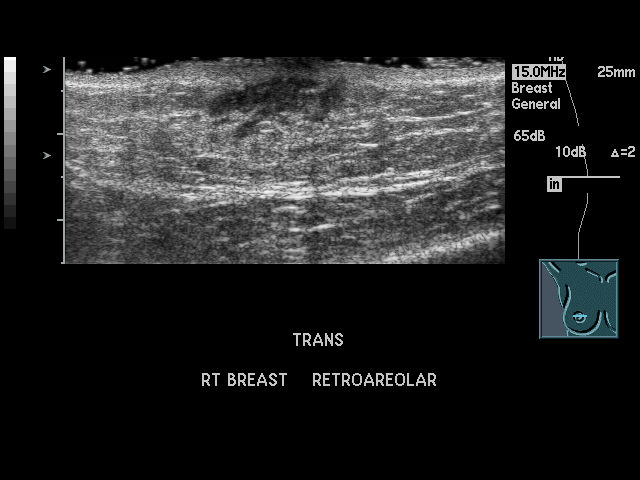
[im 8/11]
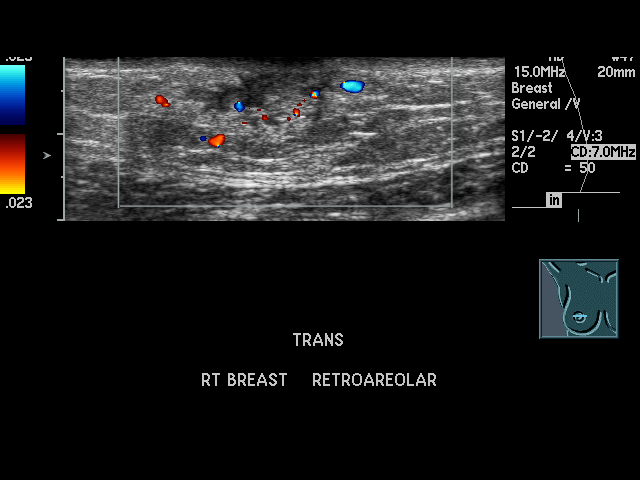
[im 9/11]
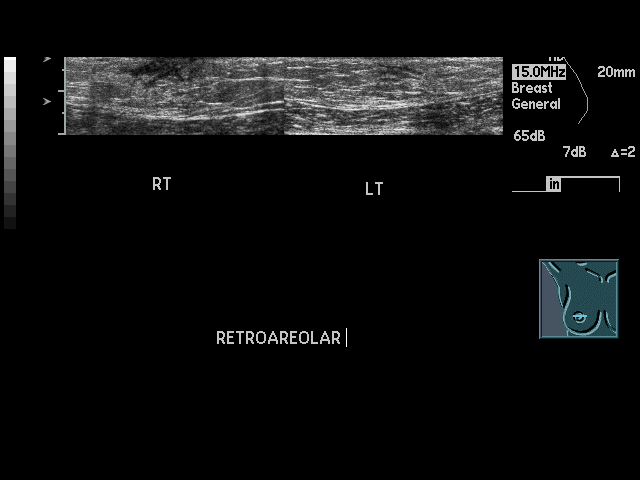
[im 10/11]
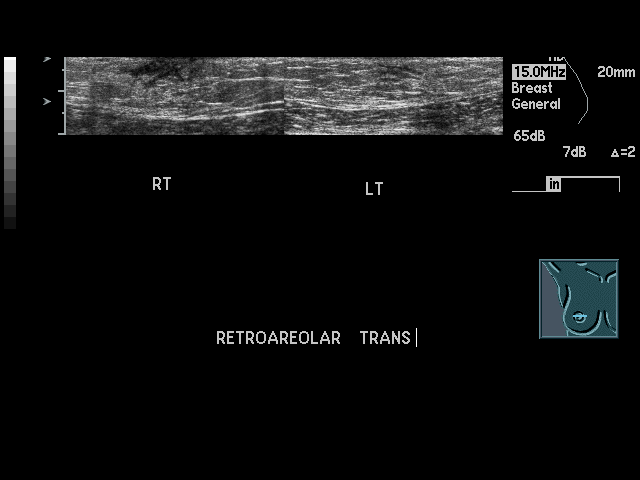
[im 11/11]
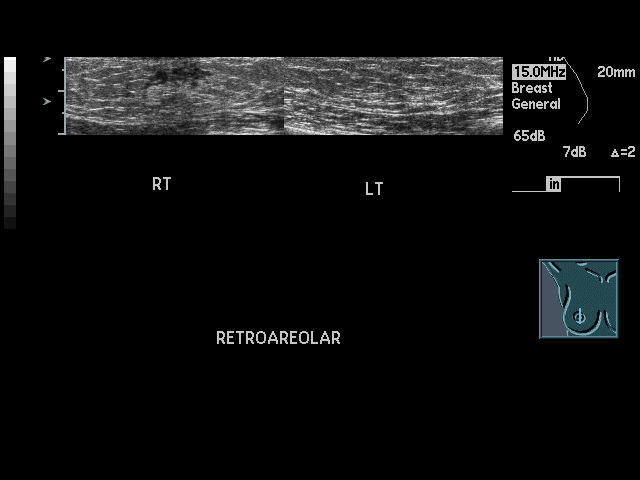

[11 of 11 positions shown; findings below may reference images not displayed]

IMPRESSION: BI-RADS: Category 4: Suspicious Abnormality

## 2009-05-16 ENCOUNTER — Ambulatory Visit: Payer: Self-pay | Admitting: Surgery

## 2009-05-26 ENCOUNTER — Ambulatory Visit: Payer: Self-pay | Admitting: Surgery

## 2009-05-26 IMAGING — US US NEEDLE LOCALIZATION*R*
1 series · 5 of 5 positions shown · non-contrast
Comparison: none

REASON FOR EXAM: exc right breast mass with US needle loc   [DATE]
SURG 9AM    [2C]
COMMENTS:

[Series 1: us needle localization*right* · 5 of 5 slices shown]
[im 1/5]
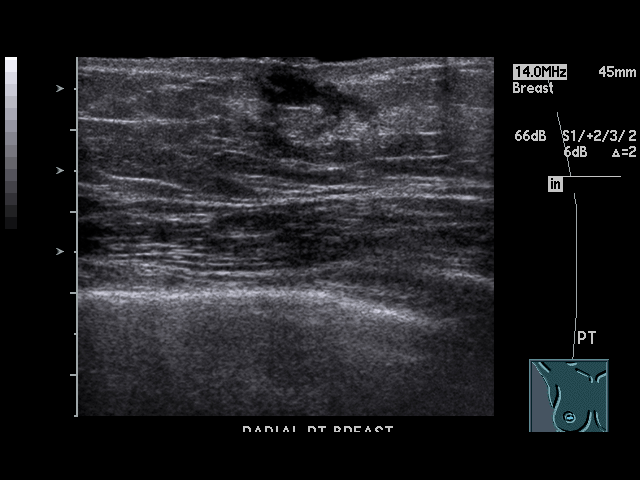
[im 2/5]
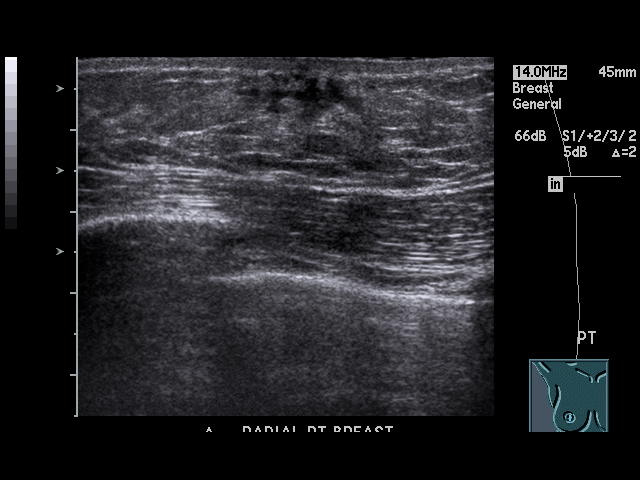
[im 3/5]
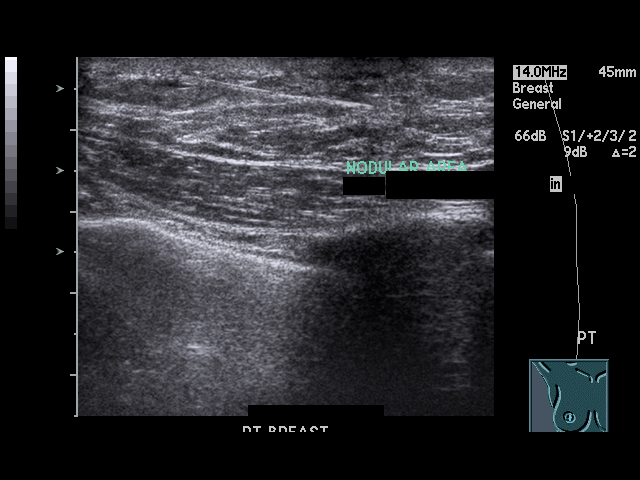
[im 4/5]
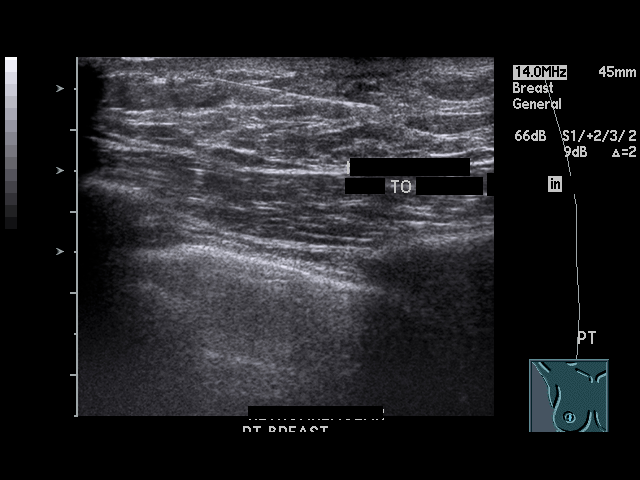
[im 5/5]
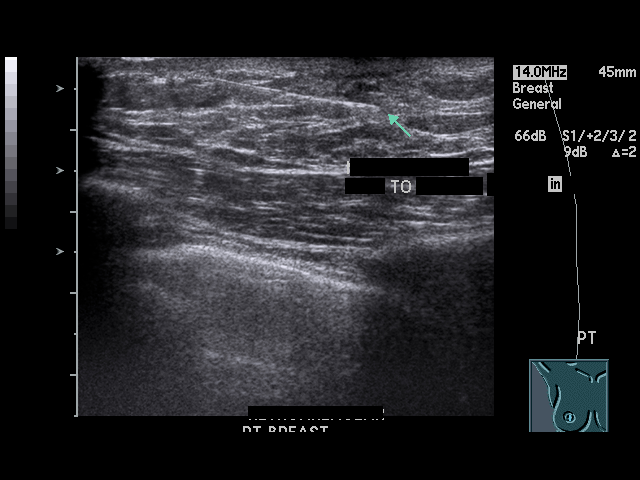

[5 of 5 positions shown; findings below may reference images not displayed]

PROCEDURE:     US  - US GUIDED NEEDLE LOCAL R BREAST  - [DATE]  [DATE]

RESULT:     The anticipated procedure was discussed with Mr. CASSIE. Prior
ultrasound studies were reviewed. A timeout procedure was called. The
patient voiced his willingness to proceed. There was no contraindication to
the use of cutaneous iodine solution for cleansing or for the use of
subcutaneous lidocaine for anesthesia.

The skin over the right nipple was cleansed with an iodine solution. The
transducer was placed in a sterile sleeve and imaging was performed in the
periareolar region. An area of nodularity deep to dilated ducts in the
retroareolar region was noted. The skin was infiltrated with 1 cc of 1%
lidocaine along the [DATE] position of the areola. Here a 7 cm CASSIE
hookwire was introduced in the usual manner. Positioning was confirmed and
the carrier needle withdrawn. The patient tolerated the procedure well.
IMPRESSION: The patient underwent successful ultrasound directed needle
localization of an area of nodularity adjacent to dilated ducts in the
retroareolar region on the right.

## 2009-06-06 ENCOUNTER — Ambulatory Visit: Payer: Self-pay | Admitting: Surgery

## 2009-06-06 IMAGING — US US NEEDLE LOCALIZATION*R*
1 series · 7 of 7 positions shown · non-contrast
Comparison: none

REASON FOR EXAM: exc R br mass w/US NL  LABS [DATE]    SURG 11AM
 [ST]
COMMENTS:

[Series 1: us needle localization*right* · 7 of 7 slices shown]
[im 1/7]
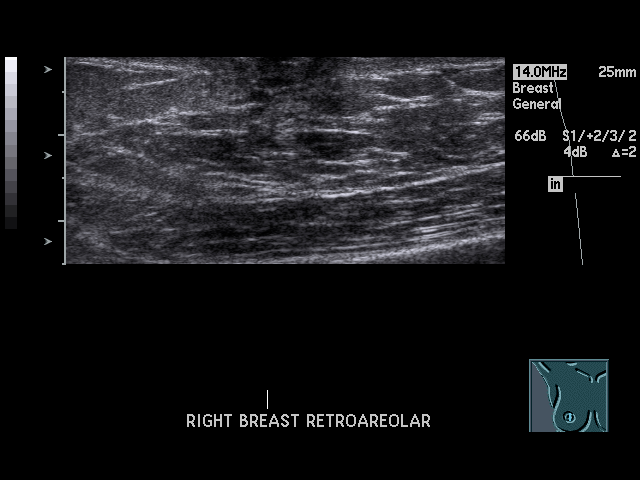
[im 2/7]
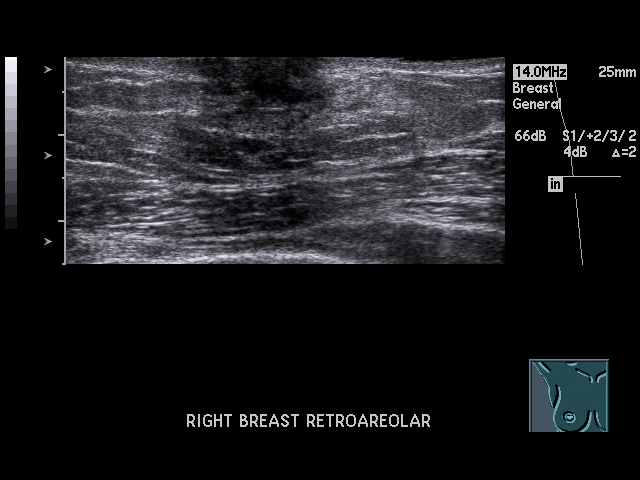
[im 3/7]
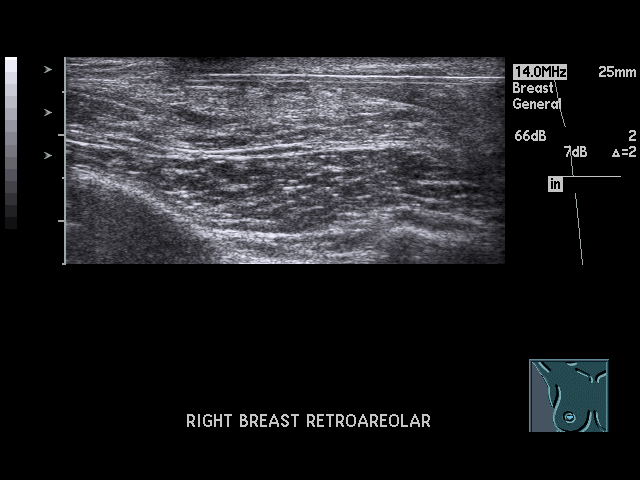
[im 4/7]
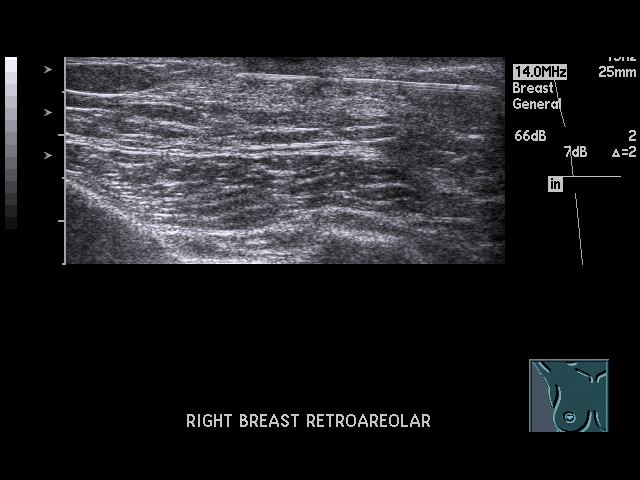
[im 5/7]
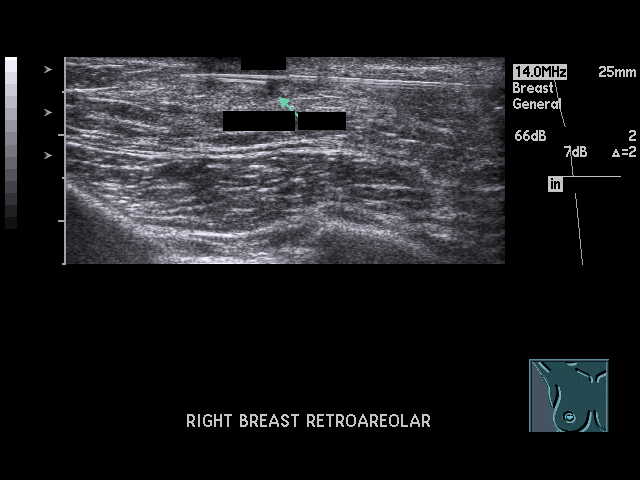
[im 6/7]
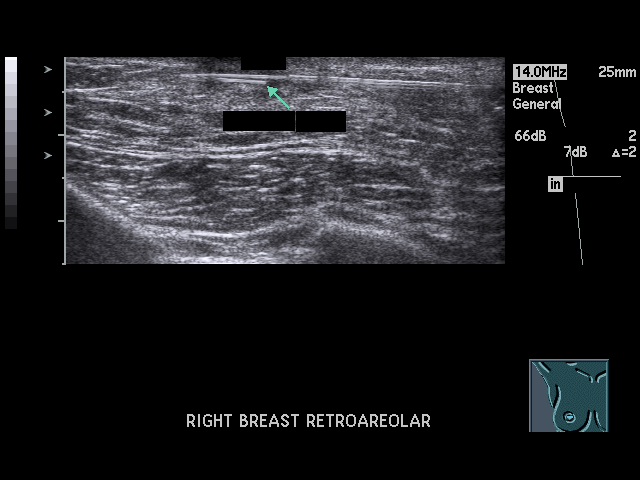
[im 7/7]
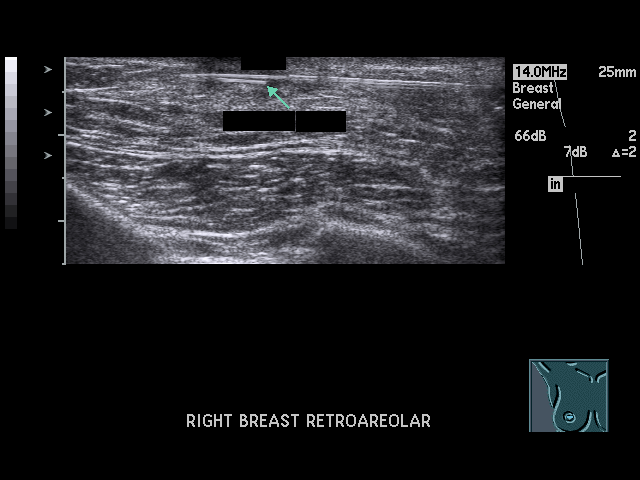

[7 of 7 positions shown; findings below may reference images not displayed]

PROCEDURE:     US  - US GUIDED NEEDLE LOCAL R BREAST  - [DATE]  [DATE]

RESULT:     The patient was informed of the risks and benefits of the
procedure and proper informed consent was obtained. The patient was brought
to the ultrasound suite and placed in supine position. An ill-defined,
small, hypoechoic focus is identified within the retroareolar portion of the
right breast. A proper entry site for needle localization was established.
The overlying soft tissues were prepped and draped in the usual sterile
fashion. Utilizing 4 ml of 1% Lidocanie without epinephrine, the overlying
soft tissues were anesthetized. Utilizing ultrasound guidance, the
hypoechoic nodule was cannulated with a 9 cm entry Kopans needle.
Appropriate needle placement was confirmed with sonographic evaluation. Wire
placement was performed utilizing ultrasound guidance. The entry needle was
removed and the hook of the wire was placed within the hypoechoic mass.
Appropriate confirmatory imaging was obtained. The patient tolerated the
procedure without complications.
IMPRESSION: Ultrasound guided right breast needle localization as
described above.

## 2009-08-26 ENCOUNTER — Inpatient Hospital Stay: Payer: Self-pay | Admitting: Internal Medicine

## 2009-08-26 IMAGING — CR DG CHEST 1V PORT
1 series · 1 of 1 positions shown · non-contrast
Comparison: none

REASON FOR EXAM: new onset afib
COMMENTS:

PROCEDURE:     DXR - DXR PORTABLE CHEST SINGLE VIEW  - [DATE]  [DATE]
RESULT:     Comparison is made to study [DATE].
The lungs are mildly hyperinflated and clear. The cardiac silhouette is top
normal in size. The pulmonary vascularity is not engorged. There is no
pleural effusion.

[view not recorded]
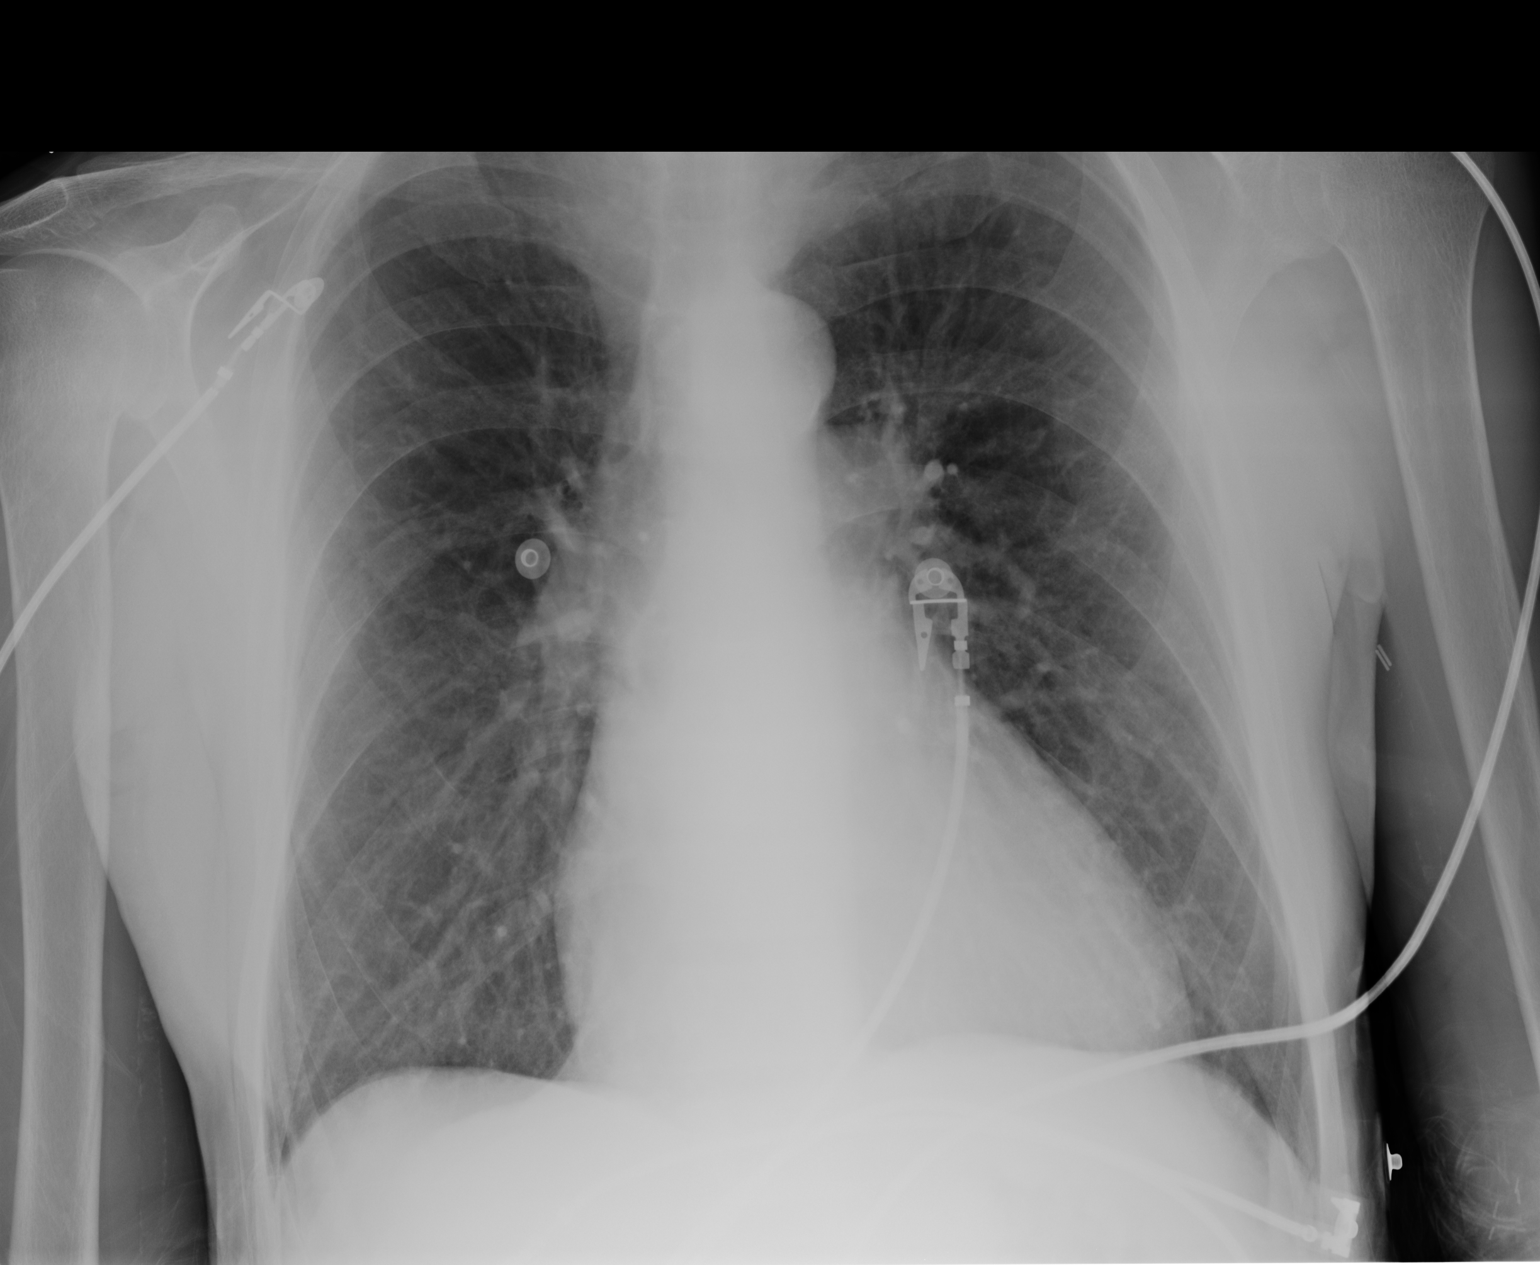

[1 of 1 positions shown; findings below may reference images not displayed]

IMPRESSION: 1. I do not see evidence of CHF nor pneumonia.
2. The cardiac silhouette is top normal in size but this is stable.

## 2009-09-16 ENCOUNTER — Emergency Department: Payer: Self-pay | Admitting: Unknown Physician Specialty

## 2012-01-21 DIAGNOSIS — N051 Unspecified nephritic syndrome with focal and segmental glomerular lesions: Secondary | ICD-10-CM | POA: Insufficient documentation

## 2012-01-21 DIAGNOSIS — Z940 Kidney transplant status: Secondary | ICD-10-CM | POA: Insufficient documentation

## 2012-01-21 DIAGNOSIS — Z9189 Other specified personal risk factors, not elsewhere classified: Secondary | ICD-10-CM | POA: Insufficient documentation

## 2012-01-21 DIAGNOSIS — K2270 Barrett's esophagus without dysplasia: Secondary | ICD-10-CM | POA: Insufficient documentation

## 2012-01-21 DIAGNOSIS — I770 Arteriovenous fistula, acquired: Secondary | ICD-10-CM | POA: Insufficient documentation

## 2012-02-04 DIAGNOSIS — Z7682 Awaiting organ transplant status: Secondary | ICD-10-CM | POA: Insufficient documentation

## 2012-06-06 ENCOUNTER — Inpatient Hospital Stay: Payer: Self-pay | Admitting: Internal Medicine

## 2012-06-06 LAB — COMPREHENSIVE METABOLIC PANEL
Anion Gap: 14 (ref 7–16)
BUN: 71 mg/dL — ABNORMAL HIGH (ref 7–18)
Bilirubin,Total: 0.4 mg/dL (ref 0.2–1.0)
Calcium, Total: 9 mg/dL (ref 8.5–10.1)
Chloride: 95 mmol/L — ABNORMAL LOW (ref 98–107)
Co2: 28 mmol/L (ref 21–32)
EGFR (African American): 4 — ABNORMAL LOW
EGFR (Non-African Amer.): 3 — ABNORMAL LOW
Glucose: 104 mg/dL — ABNORMAL HIGH (ref 65–99)
SGOT(AST): 13 U/L — ABNORMAL LOW (ref 15–37)
SGPT (ALT): 14 U/L (ref 12–78)
Sodium: 137 mmol/L (ref 136–145)
Total Protein: 7.5 g/dL (ref 6.4–8.2)

## 2012-06-06 LAB — PHOSPHORUS: Phosphorus: 4.3 mg/dL (ref 2.5–4.9)

## 2012-06-06 LAB — CBC
HCT: 34.2 % — ABNORMAL LOW (ref 40.0–52.0)
HGB: 11.7 g/dL — ABNORMAL LOW (ref 13.0–18.0)
MCH: 32.7 pg (ref 26.0–34.0)
MCHC: 34.3 g/dL (ref 32.0–36.0)
MCV: 95 fL (ref 80–100)
RDW: 14.8 % — ABNORMAL HIGH (ref 11.5–14.5)

## 2012-06-06 LAB — CK TOTAL AND CKMB (NOT AT ARMC): CK-MB: 0.7 ng/mL (ref 0.5–3.6)

## 2012-06-06 LAB — TROPONIN I: Troponin-I: 0.07 ng/mL — ABNORMAL HIGH

## 2012-06-06 LAB — LACTATE DEHYDROGENASE: LDH: 247 U/L — ABNORMAL HIGH (ref 85–241)

## 2012-06-06 IMAGING — CR DG CHEST 2V
1 series · 3 of 3 positions shown · non-contrast
Comparison: none

REASON FOR EXAM: Shortness of Breath
COMMENTS:   May transport without cardiac monitor

PROCEDURE:     DXR - DXR CHEST PA (OR AP) AND LATERAL  - [DATE] [DATE]
RESULT:
Comparison is made a prior study dated [DATE].

[Series 1: pa · 0.17mm/px · 3 of 3 slices shown]
[im 1/3]
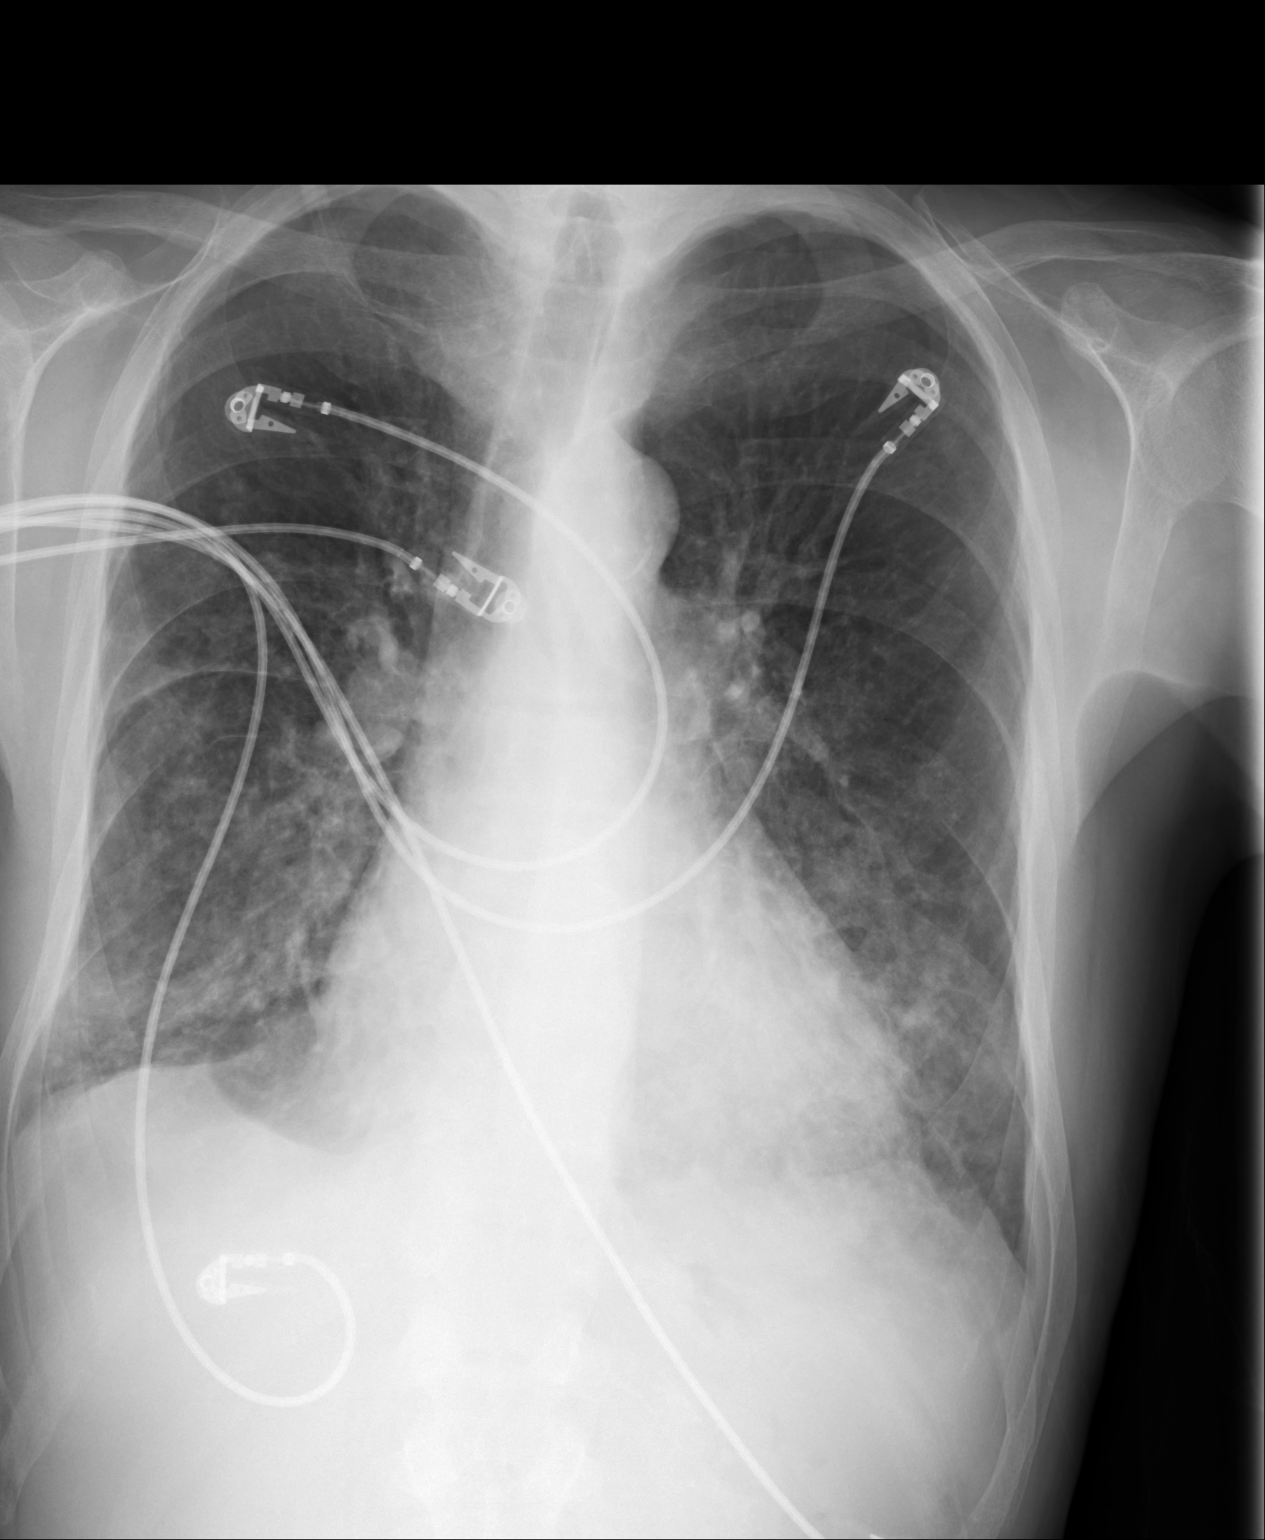
[im 2/3]
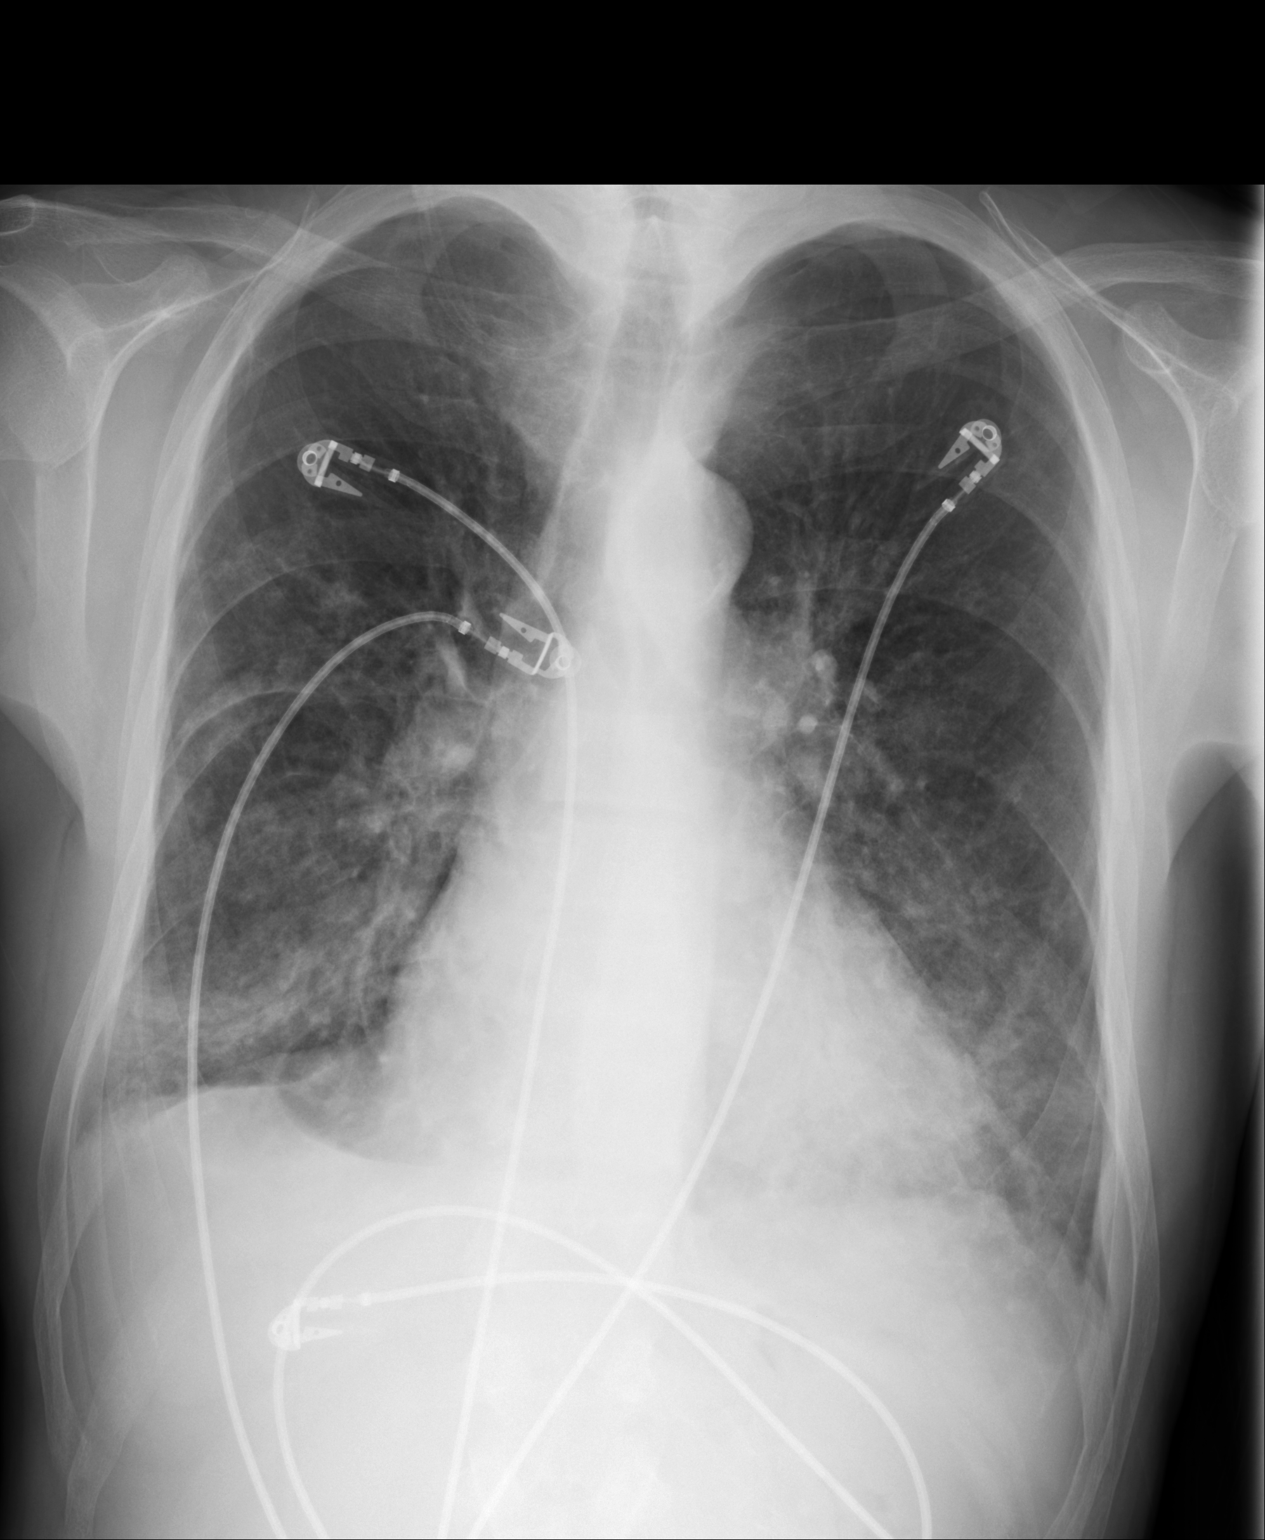
[im 3/3]
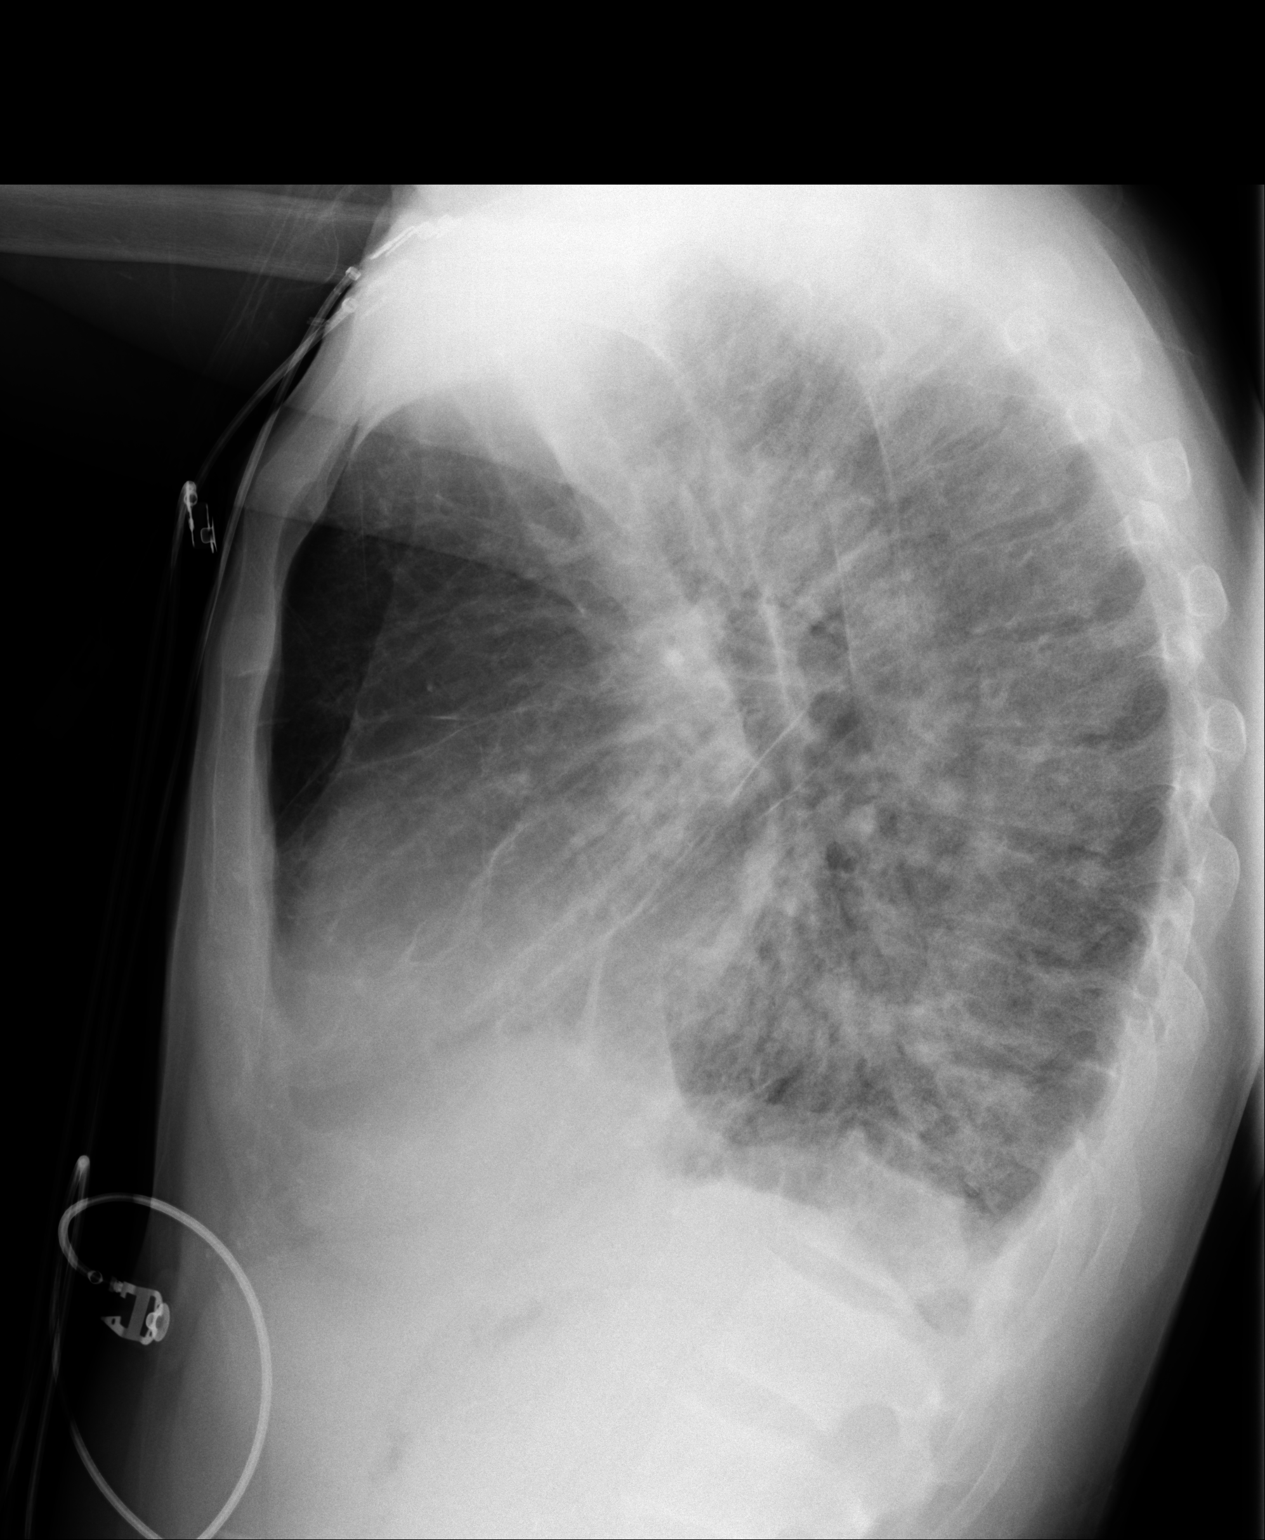

[3 of 3 positions shown; findings below may reference images not displayed]

FINDINGS: There is prominence of the interstitial markings and peribronchial
cuffing. The cardiac silhouette is enlarged. The visualized bony skeleton is
unremarkable. There is increased AP diameter of the chest and flattening of
the hemidiaphragms.
IMPRESSION: Interstitial infiltrate likely representing a component of
pulmonary edema. Infectious or inflammatory infiltrate cannot be excluded,
if clinically warranted. These areas are superimposed upon COPD.
Surveillance evaluation is recommended.

## 2012-06-07 LAB — CBC WITH DIFFERENTIAL/PLATELET
Basophil #: 0 10*3/uL (ref 0.0–0.1)
Basophil %: 0.7 %
Eosinophil %: 0.4 %
HGB: 10.5 g/dL — ABNORMAL LOW (ref 13.0–18.0)
Lymphocyte #: 0.8 10*3/uL — ABNORMAL LOW (ref 1.0–3.6)
MCH: 32.4 pg (ref 26.0–34.0)
MCV: 95 fL (ref 80–100)
Monocyte #: 0.7 x10 3/mm (ref 0.2–1.0)
Neutrophil #: 3.2 10*3/uL (ref 1.4–6.5)
RBC: 3.23 10*6/uL — ABNORMAL LOW (ref 4.40–5.90)
RDW: 14.6 % — ABNORMAL HIGH (ref 11.5–14.5)

## 2012-06-07 LAB — BASIC METABOLIC PANEL
Anion Gap: 10 (ref 7–16)
BUN: 34 mg/dL — ABNORMAL HIGH (ref 7–18)
Calcium, Total: 8.1 mg/dL — ABNORMAL LOW (ref 8.5–10.1)
Co2: 32 mmol/L (ref 21–32)
Glucose: 86 mg/dL (ref 65–99)
Osmolality: 286 (ref 275–301)
Potassium: 4.6 mmol/L (ref 3.5–5.1)
Sodium: 140 mmol/L (ref 136–145)

## 2012-06-08 DIAGNOSIS — I4891 Unspecified atrial fibrillation: Secondary | ICD-10-CM

## 2012-06-08 LAB — PHOSPHORUS: Phosphorus: 4.5 mg/dL (ref 2.5–4.9)

## 2012-06-09 DIAGNOSIS — I059 Rheumatic mitral valve disease, unspecified: Secondary | ICD-10-CM

## 2012-06-09 DIAGNOSIS — I4891 Unspecified atrial fibrillation: Secondary | ICD-10-CM

## 2012-06-09 IMAGING — CR DG CHEST 1V PORT
1 series · 1 of 1 positions shown · non-contrast
Comparison: none

REASON FOR EXAM: pneumonia
COMMENTS:

PROCEDURE:     DXR - DXR PORTABLE CHEST SINGLE VIEW  - [DATE]  [DATE]
RESULT:     Comparison: [DATE] and [DATE]

[ap]
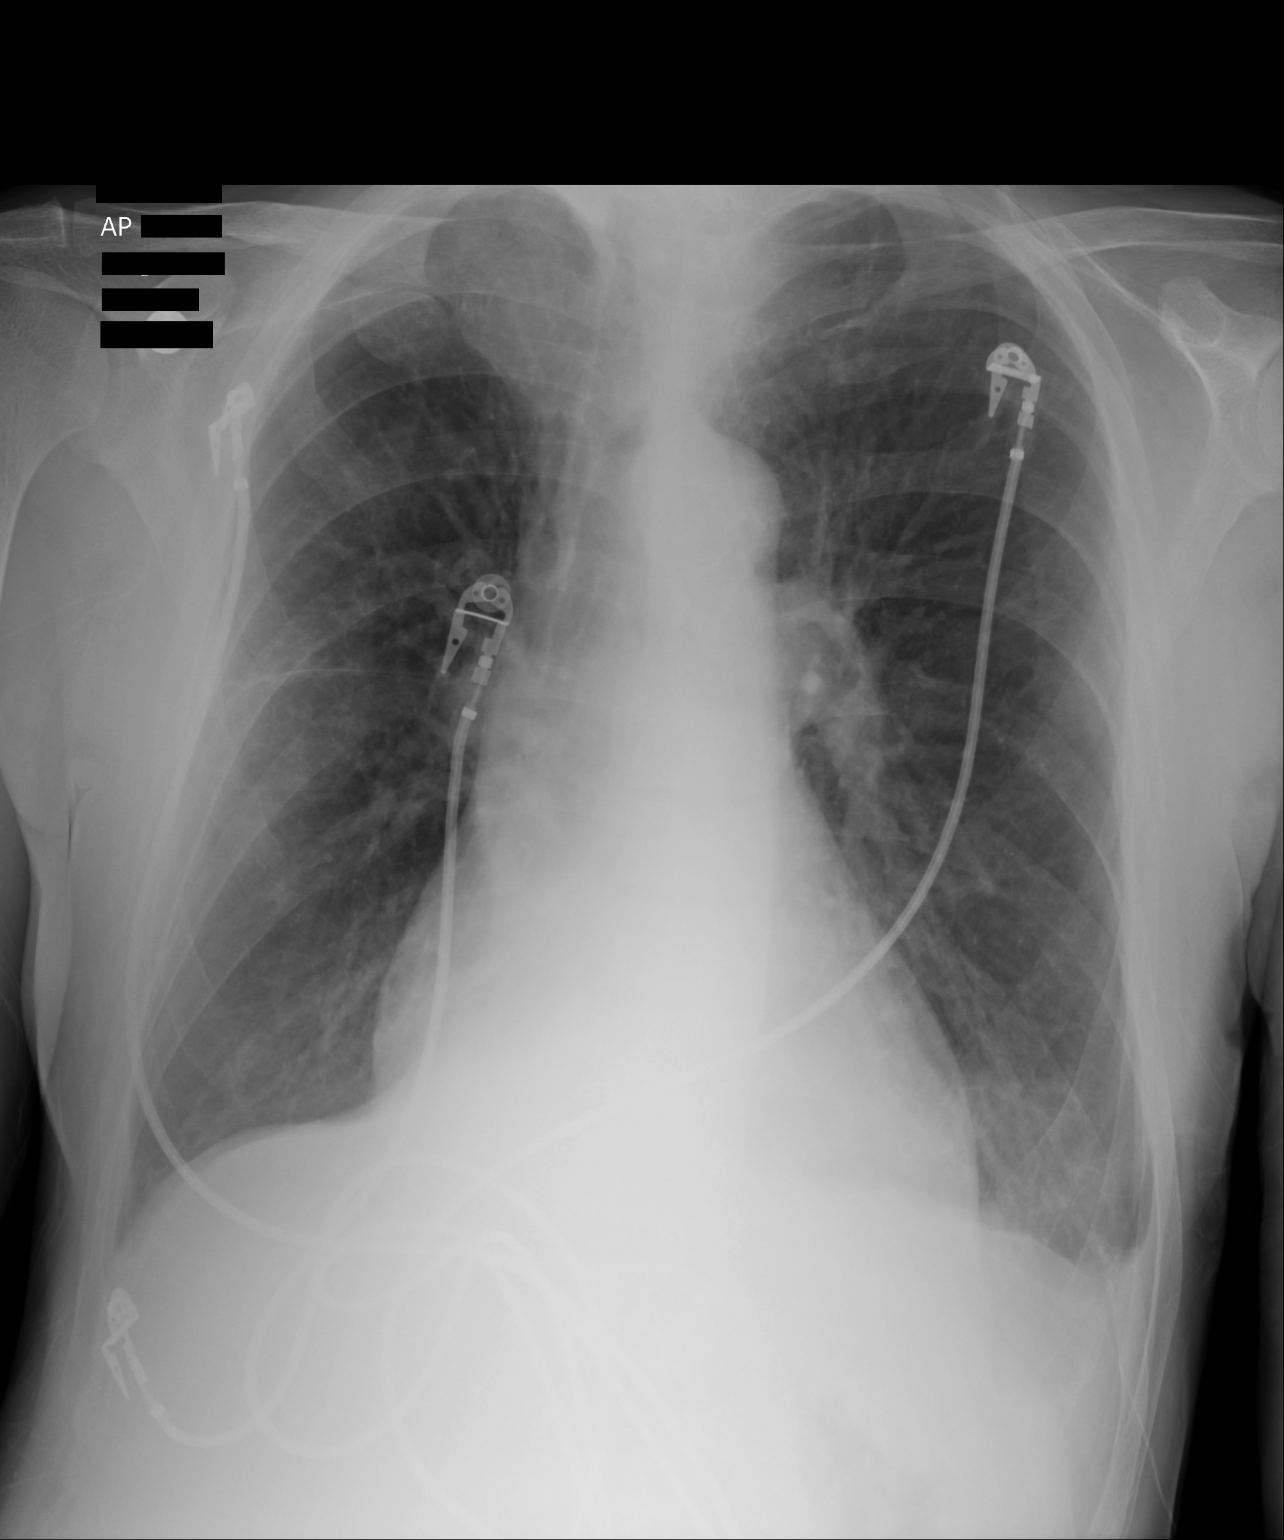

[1 of 1 positions shown; findings below may reference images not displayed]

FINDINGS: Cardiomegaly and the mediastinum are similar to prior. There are mild
heterogeneous opacities in the left lower lobe which appear slightly
decreased from prior. Opacities in the right lower lung are also decreased
from prior. Prominence of the right superior mediastinum appears similar to
prior given differences in positioning and may be secondary to tortuous
great vessels or thyroid goiter. There is a trace left pleural effusion.
IMPRESSION: 1. Interval decrease in bilateral heterogeneous opacities. These may
represent improving pneumonia. Continued followup is recommended to ensure
resolution.
2. Prominence of the right superior mediastinum is similar to prior and may
be secondary to tortuous great vessels or thyroid goiter. However, attention
is recommended on the followup radiograph.

[REDACTED]

## 2012-06-10 LAB — BASIC METABOLIC PANEL
Anion Gap: 11 (ref 7–16)
Calcium, Total: 9.2 mg/dL (ref 8.5–10.1)
Chloride: 102 mmol/L (ref 98–107)
Co2: 29 mmol/L (ref 21–32)
EGFR (Non-African Amer.): 5 — ABNORMAL LOW
Potassium: 5 mmol/L (ref 3.5–5.1)
Sodium: 142 mmol/L (ref 136–145)

## 2012-06-11 LAB — CBC WITH DIFFERENTIAL/PLATELET
Basophil %: 1.1 %
Eosinophil #: 0.2 10*3/uL (ref 0.0–0.7)
Eosinophil %: 3 %
HGB: 11.4 g/dL — ABNORMAL LOW (ref 13.0–18.0)
Lymphocyte #: 1.2 10*3/uL (ref 1.0–3.6)
Lymphocyte %: 22.1 %
MCH: 31.3 pg (ref 26.0–34.0)
MCV: 95 fL (ref 80–100)
Neutrophil #: 3.4 10*3/uL (ref 1.4–6.5)
Neutrophil %: 61 %
RBC: 3.64 10*6/uL — ABNORMAL LOW (ref 4.40–5.90)
WBC: 5.5 10*3/uL (ref 3.8–10.6)

## 2012-06-11 LAB — CULTURE, BLOOD (SINGLE)

## 2012-07-28 DIAGNOSIS — R918 Other nonspecific abnormal finding of lung field: Secondary | ICD-10-CM | POA: Insufficient documentation

## 2013-01-09 DIAGNOSIS — I10 Essential (primary) hypertension: Secondary | ICD-10-CM | POA: Insufficient documentation

## 2013-06-16 ENCOUNTER — Other Ambulatory Visit: Payer: Self-pay | Admitting: Internal Medicine

## 2013-06-18 ENCOUNTER — Ambulatory Visit: Payer: Self-pay | Admitting: Vascular Surgery

## 2013-06-18 IMAGING — XA IR VASCULAR PROCEDURE
8 series · 15 of 15 positions shown · IV contrast (IODINE)
Comparison: none

[Series 1: care upper arm · 2 of 2 slices shown (1 of 7)]
[im 1/2]
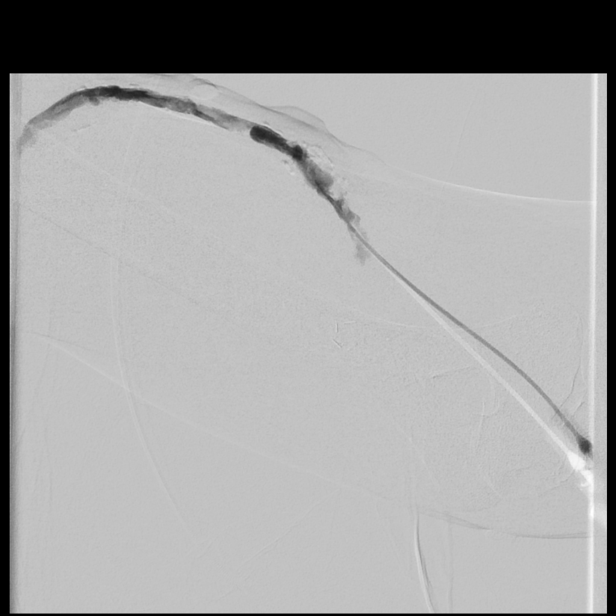
[im 2/2]
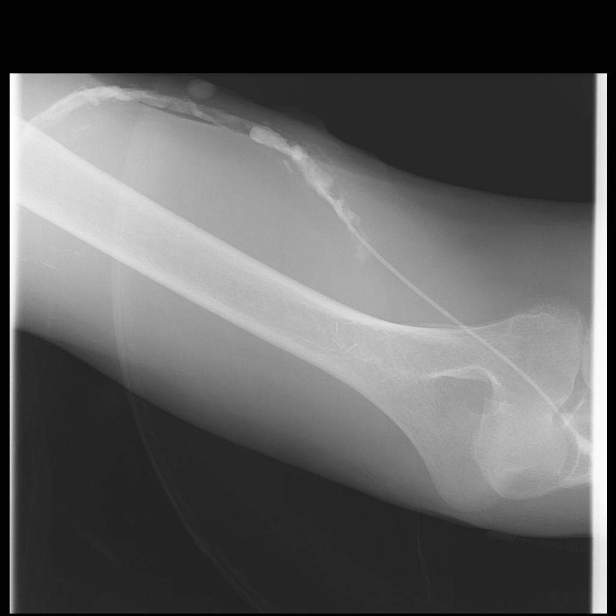

[Series 2: care upper arm · 2 of 2 slices shown (2 of 7)]
[im 1/2]
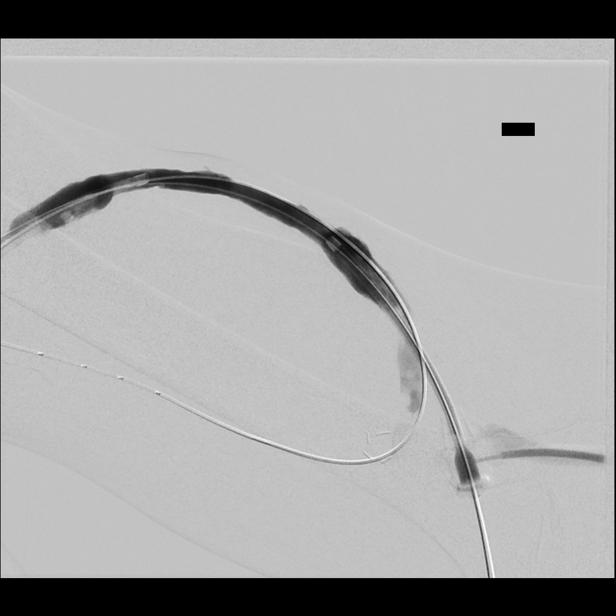
[im 2/2]
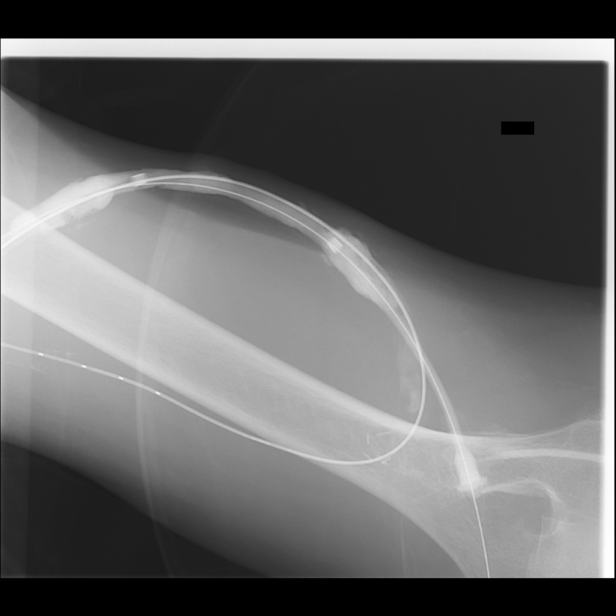

[Series 3: care upper arm · 2 of 2 slices shown (3 of 7)]
[im 1/2]
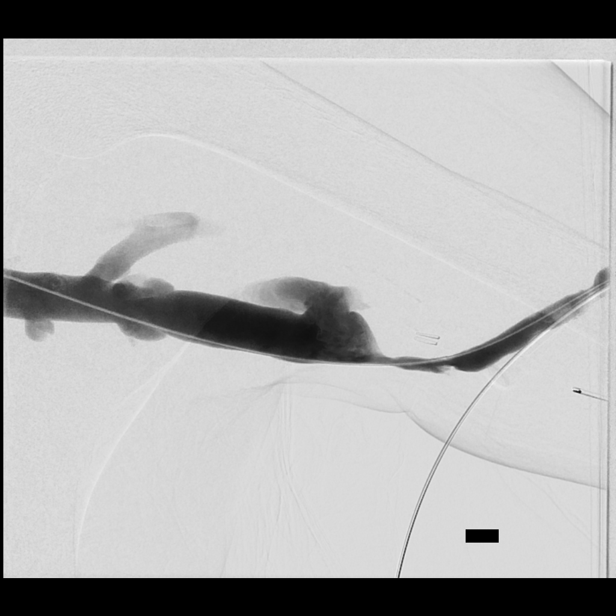
[im 2/2]
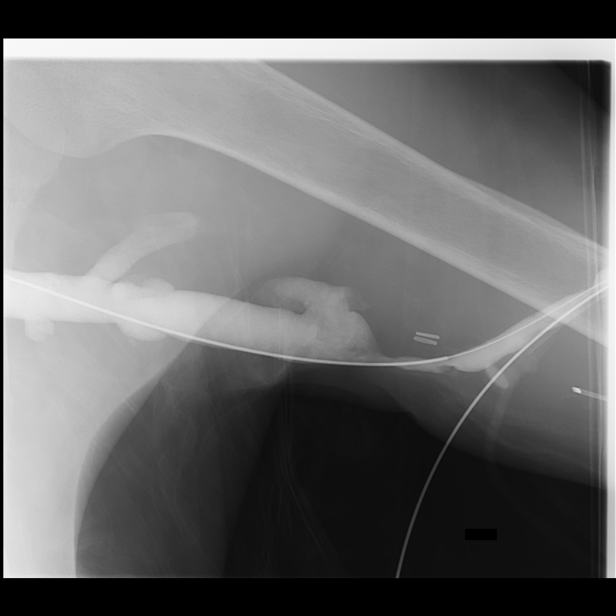

[Series 4: care upper arm · 2 of 2 slices shown (4 of 7)]
[im 1/2]
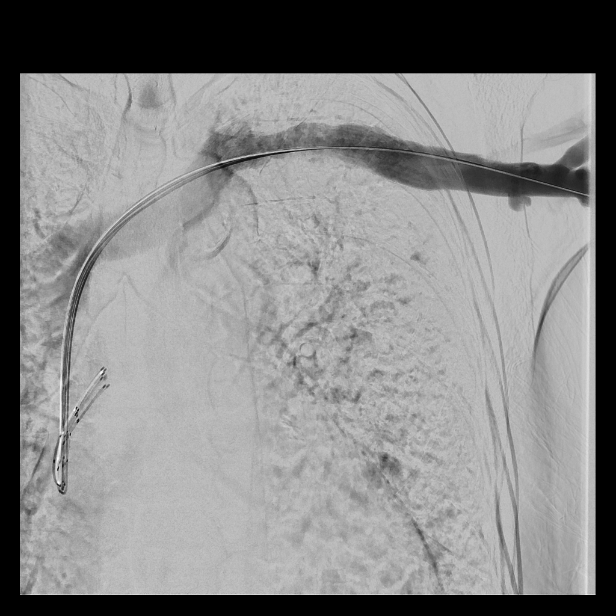
[im 2/2]
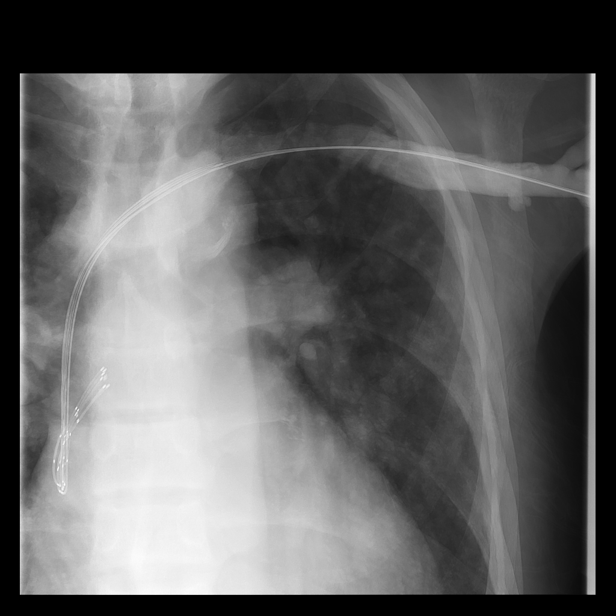

[Series 5: care upper arm · 2 of 2 slices shown (5 of 7)]
[im 1/2]
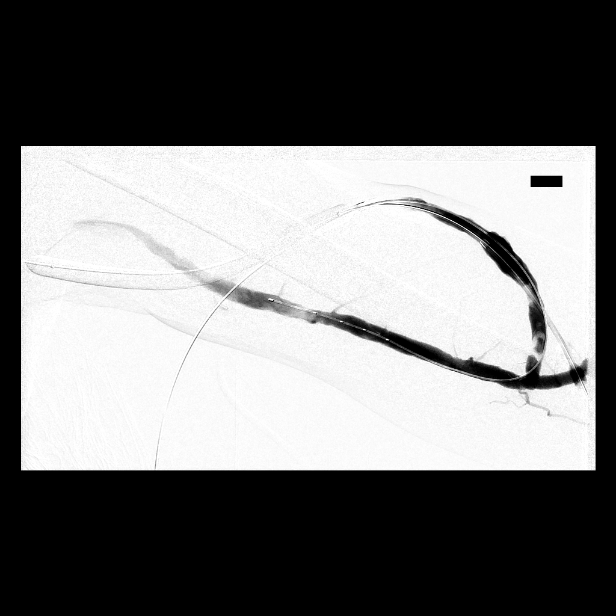
[im 2/2]
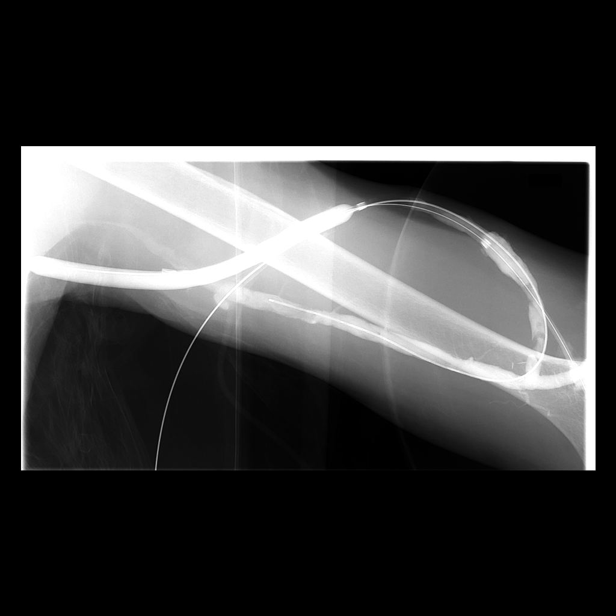

[Series 6: fl - angio · 1 of 1 slices shown]
[im 1/1]
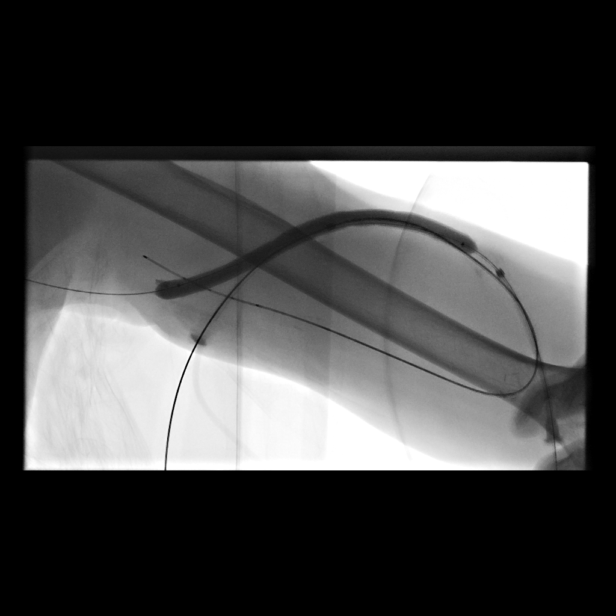

[Series 7: care upper arm · 2 of 2 slices shown (6 of 7)]
[im 1/2]
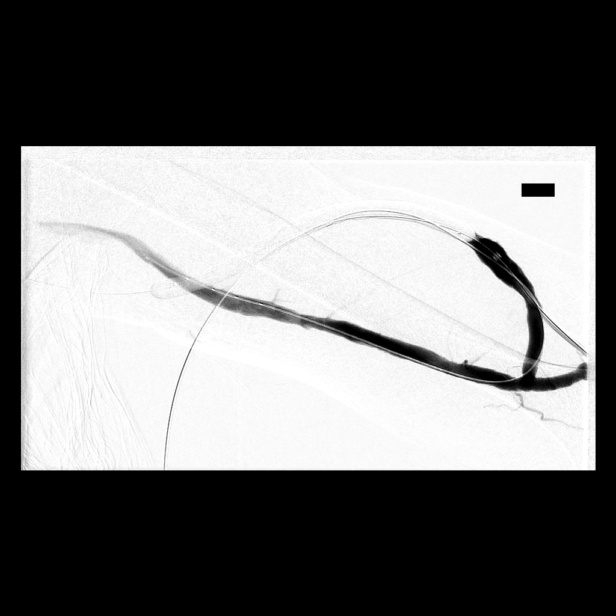
[im 2/2]
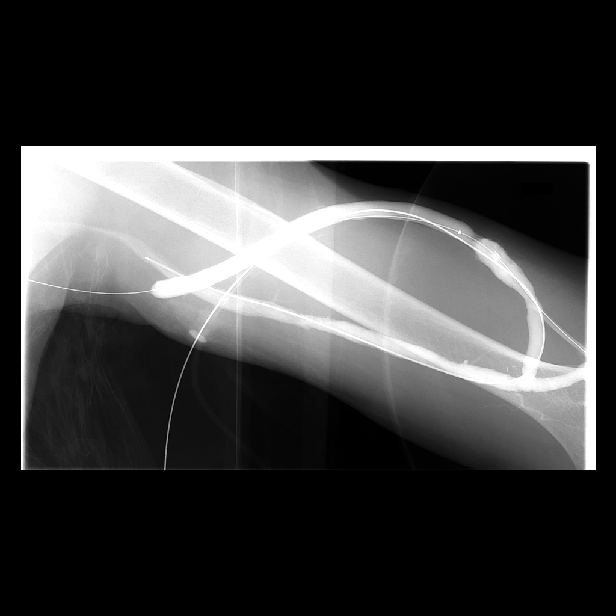

[Series 8: care upper arm · 2 of 2 slices shown (7 of 7)]
[im 1/2]
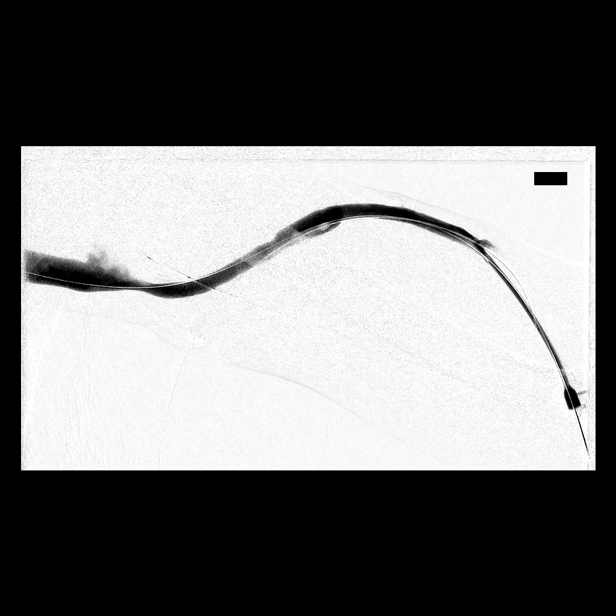
[im 2/2]
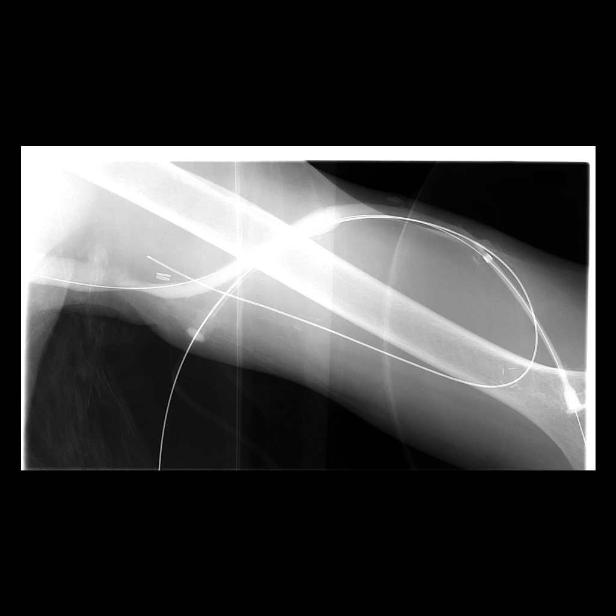

[15 of 15 positions shown; findings below may reference images not displayed]

IMAGES IMPORTED FROM THE [employer] WORKFLOW SYSTEM
NO DICTATION FOR STUDY

## 2014-08-20 DIAGNOSIS — G40209 Localization-related (focal) (partial) symptomatic epilepsy and epileptic syndromes with complex partial seizures, not intractable, without status epilepticus: Secondary | ICD-10-CM | POA: Insufficient documentation

## 2014-10-29 ENCOUNTER — Other Ambulatory Visit: Payer: Self-pay | Admitting: Internal Medicine

## 2014-11-26 NOTE — H&P (Signed)
PATIENT NAME:  Scott Castro, Scott Castro MR#:  [id number] DATE OF BIRTH:  01/13/1956  DATE OF ADMISSION:  06/06/2012  PRIMARY CARE PHYSICIAN: None.  NEPHROLOGIST: [facility], Dr. Trinda Castro    CHIEF COMPLAINT: Not feeling well with poor appetite and cough for the last several days.   HISTORY OF PRESENT ILLNESS: [name] is a 59 year old Caucasian gentleman with past medical history of end-stage renal disease secondary to focal segmental glomerulosclerosis and hypertension who came to the Emergency Room today after he was not feeling well for the last few days. The patient said he went to [place] about two weeks ago where he was in contact with his uncle who had persistent cough and bronchitis along with his cousin who also was ill with an upper respiratory infection. He came back and a few days later started feeling poorly, having cough with productive phlegm, thick whitish phlegm, poor appetite, and started feeling low in energy. He did have some chills, however, did not document any fever at home. In the Emergency Room the patient did have temperature of 100. His chest x-ray shows bilateral infiltrates consistent most likely with pneumonia given his symptoms of poor appetite, low energy, sick contact, and low-grade fever. The patient received IV Rocephin and Zithromax. He is being admitted for further evaluation and management.   PAST MEDICAL HISTORY:  1. End-stage renal disease with history of FSGS.  2. History of kidney transplant in 1985. The patient had failure of kidney transplant, hence now is back on dialysis.  3. History of kidney cancer in 1985.  4. Numerous surgeries for shunts.  5. Hypertension.  6. Cardiomyopathy with EF of around 40 to 45% per echo in January 2011.  7. Gastroesophageal reflux disease.   SOCIAL HISTORY: He lives by himself. He drinks occasionally. No alcohol use. He used to work at [employer].   FAMILY HISTORY: Diabetes and coronary artery disease.   ALLERGIES: No known drug  allergies.   MEDICATIONS:  1. Aspirin 81 mg daily.  2. Clonidine 0.2 mg patch one topical every Sunday.  3. Enalapril 20 mg b.i.d.  4. Metoprolol 100 mg b.i.d.  5. Minoxidil 2.5 mg b.i.d.  6. Nexium 40 mg b.i.d.  7. Renvela 800 mg 3 times a day.  8. Sensipar 30 mg p.o. daily.   REVIEW OF SYSTEMS: CONSTITUTIONAL: Positive for fever, fatigue, weakness. EYES: No blurred or double vision. ENT: No tinnitus, ear pain, hearing loss. RESPIRATORY: Positive for cough, shortness of breath, and chest tightness. CARDIOVASCULAR: No chest pain, orthopnea, edema. Positive for hypertension. GI: No nausea. Positive for gastroesophageal reflux disease. No abdominal pain, rectal bleeding, or constipation. GU: No dysuria or hematuria. ENDOCRINE: No polyuria or nocturia. HEMATOLOGY: No anemia or easy bruising. SKIN: No acne or rash. MUSCULOSKELETAL: Positive for arthritis. NEUROLOGIC: No CVA or TIA. PSYCH: No anxiety or depression. All other systems reviewed and negative.   PHYSICAL EXAMINATION:   GENERAL: The patient is awake, alert, oriented x3 not in acute distress.   VITAL SIGNS: Temperature 100, blood pressure 143/69, pulse 77, sats 98% on 2 liters.   HEENT: Atraumatic, normocephalic. Pupils equal, round, and reactive to light and accommodation. Extraocular movements intact. Oral mucosa is dry.   NECK: Supple. No JVD. No carotid bruit.   RESPIRATORY: Clear to auscultation bilaterally. No rales or rhonchi.   RESPIRATORY: Decreased breath sounds in the bases. Bilateral coarse breath sounds heard. No respiratory distress or use of accessory muscles.   CARDIOVASCULAR: Both the heart sounds are normal. Rate, rhythm  regular. PMI not lateralized. Chest nontender. Good pedal pulses. Good femoral pulses. No lower extremity edema.   ABDOMEN: Soft, benign, nontender. No organomegaly. Positive bowel sounds.   NEUROLOGIC: Grossly intact cranial nerves II through XII. No motor or sensory deficit.   PSYCH: The  patient is awake, alert, oriented x3.   SKIN: Warm and dry.   LABORATORY, DIAGNOSTIC, AND RADIOLOGICAL DATA: Chest x-ray shows bilateral interstitial infiltrate representing a component of pulmonary edema versus infection. Superimposed COPD.   EKG shows sinus rhythm with possible LAD, LVH changes.   White count 8.3, hemoglobin and hematocrit 11.7 and 34.2. CK total and MB within normal limits. Troponin 0.07. Glucose 104, BUN 71, sodium 137, potassium 6.2, chloride 95, bicarb 28, calcium 9.0. LDH is 247.   ASSESSMENT: 59 year old [name] with history of end-stage renal disease secondary to FSGS since 1980, history of kidney cancer, and hypertension who comes in with:  1. Bilateral pneumonia. The patient has had contact about two weeks ago, low-grade fever, abnormal chest x-ray, poor appetite, cough with minimal phlegm. The patient will be admitted on the medical floor. Will continue IV Rocephin and IV Zithromax. Follow blood cultures and sputum cultures. Will give nebulizers around-the-clock. Give oxygen and titrate as tolerated.  2. End-stage renal disease, on hemodialysis. The patient has history of FSGS and history of hypertension. Dr. Holley Castro saw the patient in the Emergency Room. Will continue dialysis on schedule. He will get dialyzed on an urgent basis today due to hyperkalemia.  3. Hyperkalemia. The patient will undergo urgent hemodialysis. He was seen by Dr. Holley Castro in the Emergency Room.  4. Hypertension. Continue his home medications which are minoxidil, clonidine, and metoprolol.  5. Gastroesophageal reflux disease. Continue Nexium.  6. Mild elevated troponin, appears demand ischemia in the setting of pneumonia. EKG showed no acute changes.  7. History of cardiomyopathy with EF about 40 to 45% by echo in January 2011.   8. Will give DVT prophylaxis with heparin sub-Q b.i.d.  9. Further work-up according to the patient's clinical course.   Hospital admission plan was discussed with the  patient. No family members were present in the Emergency Room.   TIME SPENT: 50 minutes.    ____________________________ Scott Castro Pronto, MD sap:drc D: 06/06/2012 13:39:19 ET T: 06/06/2012 14:16:21 ET JOB#: [id number]  cc: Lemuel Boodram A. Castro Pronto, MD, <Dictator> Scott Pascal, MD [facility] Nephrology Ilda Basset MD ELECTRONICALLY SIGNED 06/10/2012 12:51

## 2014-11-26 NOTE — Discharge Summary (Signed)
PATIENT NAME:  Scott Castro, Scott Castro MR#:  [id number] DATE OF BIRTH:  [birth year]  DATE OF ADMISSION:  06/06/2012 DATE OF DISCHARGE:  06/11/2012  PRIMARY CARE PHYSICIAN: None.  NEPHROLOGIST: Trinda Pascal, MD - [place]:  1. Pneumonia. 2. Hypertension. 3. End-stage renal disease, on hemodialysis.   HISTORY OF PRESENT ILLNESS: A 59 year old male with medical history of end stage renal disease and focal segment glomerulonephritis and hypertension who came to Emergency Room because he was not feeling well for the last few days before presentation. He went to [place] two weeks ago and was in contact with an uncle who had persistent cough and bronchitis along with his cousin who also was ill with an upper respiratory infection. He came back and started feeling the same where he was having cough with thick-whitish colored phlegm, poor appetite, and low in energy. He did have some chills, however, he did not document his temperature at home. In the Emergency Room, his temperature was 100 degrees Fahrenheit and x-ray showed bilateral infiltrates consistent with pneumonia. So he was admitted with diagnosis of pneumonia with further management and IV antibiotics and Rocephin and Zithromax.   HOSPITAL COURSE AND STAY: He responded very well to the treatment of his pneumonia with IV antibiotics. Initially he needed some oxygen but later on after two or three days he was off need of oxygen, saturating well on room air.   Other medical issues handled during this hospital stay:  1. New onset atrial fibrillation. On presentation he was having atrial fibrillation which subsequently resolved. He was monitored on telemetry and the possible cause of it might be pneumonia or hyperkalemia. So that is why no anticoagulation is advised at this time. Echocardiogram was done which was normal.  2. End stage renal disease. He was continued on hemodialysis as per nephrology protocol.   3. Hypokalemia, resolved with hemodialysis. 4. Anemia which was secondary to end-stage renal disease and it remained stable. 5. Hypertension. We continued his home medications of minoxidil, clonidine, and metoprolol.  6. He had mild elevated troponin possibly due to demand ischemia and EKG showed no acute changes. Echocardiogram was normal so no further work-up was done for that. 7. Chronic systolic congestive heart failure. His ejection fraction was 40 to 45% by Echo in January 2011.   IMPORTANT LABS/RESULTS DURING HOSPITAL STAY: Echocardiogram of the heart: Left ventricular systolic function was normal, wall motion was normal, and moderate asymmetric left ventricular hypertrophy was noticed. Right ventricle systolic function was normal.   Chest x-ray showed bilateral opacities.  CONDITION ON DISCHARGE: Satisfactory.   CODE STATUS: FULL CODE.   DISCHARGE MEDICATIONS:  1. Aspirin 81 mg daily. 2. Enalapril 20 mg twice a day. 3. Nexium 40 mg twice a day. 4. Renvela 800 mg oral tablet three times daily. 5. Sensipar 30 mg oral tablet once a day. 6. Clonidine 0.2 mg/24-hour transdermal film extended-release one patch transdermal once a week. 7. Metoprolol 100 mg tablet twice a day. 8. Minoxidil 2.5 mg oral tablet twice a day. 9. Amlodipine 5 mg oral tablet once a day. 10. Azithromycin 250 mg to be take once a day, given for three more days. 11. Amoxicillin-clavulanate 500 mg/125 mg oral tablet for four more days.         DISCHARGE INSTRUCTIONS/FOLLOWUP: He was advised to go on a renal diet, regular consistency, and follow-up within 1 to 2 weeks with primary care physician, Dr. Lorelee Market, at [facility], [place], [place].  TOTAL TIME SPENT: 45 minutes.  ____________________________ Ceasar Lund Anselm Jungling, MD vgv:[id number] D: 06/14/2012 18:14:33 ET T: 06/15/2012 12:12:49 ET JOB#: [id number]  cc: Ceasar Lund. Anselm Jungling, MD, <Dictator> Trinda Pascal,  MD [place] A. Brunetta Genera, MD Vaughan Basta MD ELECTRONICALLY SIGNED 06/27/2012 22:07

## 2014-11-26 NOTE — Consult Note (Signed)
PATIENT NAME:  Scott Castro, Scott Castro MR#:  [id number] DATE OF BIRTH:  26-Jul-1956  DATE OF CONSULTATION:  06/08/2012  REFERRING PHYSICIAN:  Anthonette Legato, MD CONSULTING PHYSICIAN:  Adewale Pucillo A. Fletcher Anon, MD  REASON FOR CONSULTATION: Atrial fibrillation.   HISTORY OF PRESENT ILLNESS: This is a 59 year old Caucasian male with a past medical history of end-stage renal disease secondary to focal segmental glomerular sclerosis and hypertension. He presented to the Emergency Room with increased dyspnea and fatigue. He was diagnosed with pneumonia. I saw him in 2011 when he had one episode of atrial fibrillation with rapid ventricular response which converted to normal sinus rhythm. He was treated with metoprolol at that time. He has not had any further episodes up until two days ago when he was on dialysis. He was noted to be tachycardic with heart rate around 130 to 150. He was hyperkalemic at that time. It appears that the episode lasted while he was on dialysis only. The patient had palpitations but no chest pain or dyspnea.   PAST MEDICAL HISTORY:  1. End-stage renal disease on hemodialysis.  2. History of kidney transplant in 1985 with failure of the transplanted kidney.  3. History of kidney cancer in 1985.  4. Hypertension.  5. Paroxysmal atrial fibrillation.  6. Cardiomyopathy with previous ejection fraction of 40 to 45%, but that was done in the setting of atrial fibrillation.  7. Gastroesophageal reflux disease.   SOCIAL HISTORY: Negative for smoking. He drinks alcohol occasionally. No recreational drug use.   FAMILY HISTORY: Family history is remarkable for diabetes, coronary artery disease, and stroke.   ALLERGIES: No known drug allergies.   HOME MEDICATIONS:  1. Aspirin 81 mg daily.  2. Clonidine 0.2 mg patch every Sunday.  3. Enalapril 20 mg twice daily.  4. Metoprolol 100 mg twice daily.  5. Minoxidil 2.5 mg twice daily.  6. Nexium 40 mg twice daily.  7. Renvela 800 mg three times  daily. 8. Sensipar 30 mg once daily.   REVIEW OF SYSTEMS: A 10 point review of systems was performed. It is negative other than what is mentioned in the history of present illness.   PHYSICAL EXAMINATION:   GENERAL: The patient appears to be older than his stated age, but in no acute distress.   VITAL SIGNS: Temperature 98.4, pulse 72, respiratory rate 18, blood pressure 135/74, and oxygen saturation is 93% on room air.   HEENT: Normocephalic, atraumatic.   NECK: No jugular venous distention or carotid bruits.   RESPIRATORY: Normal respiratory effort with no use of accessory muscles. Auscultation reveals normal breath sounds.   CARDIOVASCULAR: Normal PMI. Normal S1 and S2 with no gallops or murmurs.   ABDOMEN: Benign, nontender, and nondistended.   EXTREMITIES: No clubbing, cyanosis, or edema.   SKIN: Warm and dry with no rash.   PSYCHIATRIC: He is alert and oriented x3 with normal mood and affect.   LABORATORY/DIAGNOSTIC DATA: His potassium was elevated at 6.2 but currently is 4.6. Troponin was borderline at 0.07. Hemoglobin is 10.5.   Current ECG shows normal sinus rhythm. Rhythm strip is reviewed and showed atrial fibrillation with rapid ventricular response.   IMPRESSION:  1. Paroxysmal atrial fibrillation.  2. Hypertension.  3. End-stage renal disease on hemodialysis.   RECOMMENDATIONS: The patient seems to have paroxysmal atrial fibrillation. This is his second documented episode of atrial fibrillation. It appears that since 2011, he has not had any episodes up until this recent one. Thus, the frequency of his atrial fibrillation seems to  be low. The most recent episode was in the setting of hyperkalemia and during dialysis. It was likely triggered by electrolyte abnormalities as well as fluid shift. He is currently in normal sinus rhythm. I recommend repeat echocardiogram to evaluate his LV systolic function and atrial size. I recommend continuing aspirin as well as  metoprolol at the current dose. If atrial fibrillation becomes more frequent, an antiarrhythmic medication might be needed. Regarding long-term anticoagulation, his CHADS-VASc score is 1, due to history of hypertension. We will have to see what his ejection fraction is on the echocardiogram. If his ejection fraction is reduced and atrial fibrillation becomes a recurrent issue, we might have to consider long-term anticoagulation with warfarin to decrease his risk of thromboembolic complications.  ____________________________ Mertie Clause Fletcher Anon, MD maa:[id number] D: 06/08/2012 17:39:18 ET T: 06/08/2012 17:48:58 ET JOB#: [id number]  cc: Rhoderick Farrel A. Fletcher Anon, MD, <Dictator> Wellington Hampshire MD ELECTRONICALLY SIGNED 06/10/2012 10:34

## 2014-11-26 NOTE — Consult Note (Signed)
Brief Consult Note: Diagnosis: paroxysmal atrial fibrillation.   Patient was seen by consultant.   Consult note dictated.   Comments: I suspect the rest episode was due to electrolytes abnormalities and fluid shift.  Continue Aspirin and Metoprolol.  Will check an echocardiogram.  If A-fib becomes a recurrent issue, we will have to consider an antiarrhythmic medication and oral anticoagulation with Warfarin.  Electronic Signatures: Kathlyn Sacramento (MD)  (Signed 31-Oct-13 17:25)  Authored: Brief Consult Note   Last Updated: 31-Oct-13 17:25 by Kathlyn Sacramento (MD)

## 2014-11-29 NOTE — Op Note (Signed)
PATIENT NAME:  Scott Castro, Scott Castro MR#:  [id number] DATE OF BIRTH:  Jun 06, 1956  DATE OF PROCEDURE:  06/18/2013  PREOPERATIVE DIAGNOSES: 1.  End-stage renal disease.  2.  Clotted left arm arteriovenous graft.   POSTOPERATIVE DIAGNOSES: 1.  End-stage renal disease.  2.  Clotted left arm arteriovenous graft.   PROCEDURE: 1.  Ultrasound guidance for vascular access in both an antegrade and retrograde fashion to the AV graft.  2.  Left upper extremity shuntogram and central venogram.  3.  Catheter-directed thrombolysis with 4 mg of tPA to the AV graft.  4.  Mechanical rheolytic thrombectomy of the AV graft.  5.  Percutaneous transluminal angioplasty of venous anastomosis with 7 mm diameter angioplasty balloon.  6.  Percutaneous transluminal angioplasty of midportion and access sites of AV graft for residual stenosis and thrombosis after above procedures.   SURGEON: Algernon Huxley, M.D.   ANESTHESIA: Local with moderate conscious sedation.   ESTIMATED BLOOD LOSS: 25 mL.   INDICATION FOR PROCEDURE: This is a 59 year old white male with end-stage renal disease. His graft is clotted and we are attempting to salvage this.   DESCRIPTION OF PROCEDURE: The patient was brought to the vascular interventional radiology suite. Left upper extremity was sterilely prepped and draped, and a sterile surgical field was created. The graft was accessed in both an antegrade and retrograde fashion within a crossing configuration with micropuncture needles. Micropuncture wire and sheath were placed, and we upsized to a 6-French sheath. Permanent images were recorded. 3000 units of intravenous heparin were given for systemic anticoagulation, and Magic Torque wires were placed through both sheaths. Catheter-directed thrombolysis was performed with 4 mg of tPA  instilled through the catheter from the arterial anastomosis into the axillary vein. This was allowed to dwell for over 15 minutes. Mechanical rheolytic thrombectomy was  then performed from the brachial artery throughout the entirety of the graft and into the axillary vein. This uncovered an 80% to 85% stenosis at the venous anastomosis, and there was still residual thrombus and stenosis within the AV graft. In addition, there was an arterial plug that we treated with a Fogarty embolectomy catheter with a retrograde sheath, which cleared the arterial limb of the graft. The midportion of the graft and the venous access site were treated with a 7 mm diameter angioplasty balloon. The venous anastomosis was also treated with a 7 mm diameter angioplasty balloon. Following these, the graft was now patent with a good thrill and no hemodynamically significant residual stenosis, and I elected to terminate the procedure. The sheath was removed around a 4-0 Monocryl pursestring suture. Pressure was held. Sterile dressing was placed. The patient tolerated the procedure well and was taken to the recovery room in stable condition.    ____________________________ Algernon Huxley, MD jsd:jcm D: 06/18/2013 15:54:19 ET T: 06/18/2013 20:06:37 ET JOB#: [id number]  cc: Algernon Huxley, MD, <Dictator> Algernon Huxley MD ELECTRONICALLY SIGNED 06/21/2013 14:47

## 2016-03-18 ENCOUNTER — Encounter: Payer: Self-pay | Admitting: *Deleted

## 2016-03-20 ENCOUNTER — Emergency Department
Admission: EM | Admit: 2016-03-20 | Discharge: 2016-03-20 | Disposition: A | Discharge status: Home / Self Care | Payer: Medicare Other | Attending: Student in an Organized Health Care Education/Training Program | Admitting: Student in an Organized Health Care Education/Training Program

## 2016-03-20 ENCOUNTER — Emergency Department: Payer: Medicare Other

## 2016-03-20 DIAGNOSIS — Z992 Dependence on renal dialysis: Secondary | ICD-10-CM | POA: Insufficient documentation

## 2016-03-20 DIAGNOSIS — Z79899 Other long term (current) drug therapy: Secondary | ICD-10-CM | POA: Insufficient documentation

## 2016-03-20 DIAGNOSIS — R000 Tachycardia, unspecified: Secondary | ICD-10-CM | POA: Diagnosis present

## 2016-03-20 DIAGNOSIS — I480 Paroxysmal atrial fibrillation: Secondary | ICD-10-CM | POA: Insufficient documentation

## 2016-03-20 DIAGNOSIS — N186 End stage renal disease: Secondary | ICD-10-CM | POA: Insufficient documentation

## 2016-03-20 DIAGNOSIS — Z7982 Long term (current) use of aspirin: Secondary | ICD-10-CM | POA: Diagnosis not present

## 2016-03-20 LAB — COMPREHENSIVE METABOLIC PANEL
ALT: 8 U/L — ABNORMAL LOW (ref 17–63)
ANION GAP: 12 (ref 5–15)
AST: 12 U/L — ABNORMAL LOW (ref 15–41)
Albumin: 3.9 g/dL (ref 3.5–5.0)
Alkaline Phosphatase: 116 U/L (ref 38–126)
BILIRUBIN TOTAL: 0.3 mg/dL (ref 0.3–1.2)
BUN: 23 mg/dL — ABNORMAL HIGH (ref 6–20)
CO2: 28 mmol/L (ref 22–32)
Calcium: 8.6 mg/dL — ABNORMAL LOW (ref 8.9–10.3)
Chloride: 96 mmol/L — ABNORMAL LOW (ref 101–111)
Creatinine, Ser: 6.28 mg/dL — ABNORMAL HIGH (ref 0.61–1.24)
GFR, EST AFRICAN AMERICAN: 10 mL/min — AB (ref >60)
GFR, EST NON AFRICAN AMERICAN: 9 mL/min — AB (ref >60)
Glucose, Bld: 91 mg/dL (ref 65–99)
POTASSIUM: 4.4 mmol/L (ref 3.5–5.1)
Sodium: 136 mmol/L (ref 135–145)
TOTAL PROTEIN: 7.6 g/dL (ref 6.5–8.1)

## 2016-03-20 LAB — CBC
HEMATOCRIT: 36.8 % — AB (ref 40.0–52.0)
Hemoglobin: 12.3 g/dL — ABNORMAL LOW (ref 13.0–18.0)
MCH: 31.8 pg (ref 26.0–34.0)
MCHC: 33.4 g/dL (ref 32.0–36.0)
MCV: 95.2 fL (ref 80.0–100.0)
Platelets: 200 10*3/uL (ref 150–440)
RBC: 3.87 MIL/uL — ABNORMAL LOW (ref 4.40–5.90)
RDW: 14.9 % — AB (ref 11.5–14.5)
WBC: 5.4 10*3/uL (ref 3.8–10.6)

## 2016-03-20 LAB — PROTIME-INR
INR: 1.05
PROTHROMBIN TIME: 13.7 s (ref 11.4–15.2)

## 2016-03-20 LAB — MAGNESIUM: MAGNESIUM: 2.1 mg/dL (ref 1.7–2.4)

## 2016-03-20 IMAGING — CR DG CHEST 2V
2 series · 2 of 2 positions shown · non-contrast
Comparison: [DATE]

CLINICAL DATA: uncontrolled afib. Pt normally at dualysis for 3
hours, stopped at 2 hours because of his heart rate. Pt denies pain,
was c/o nausea earlier but reports feels better now. Denies chest
pain. Nonsmoker.

EXAM:
CHEST - 2 VIEW

[chest pa]
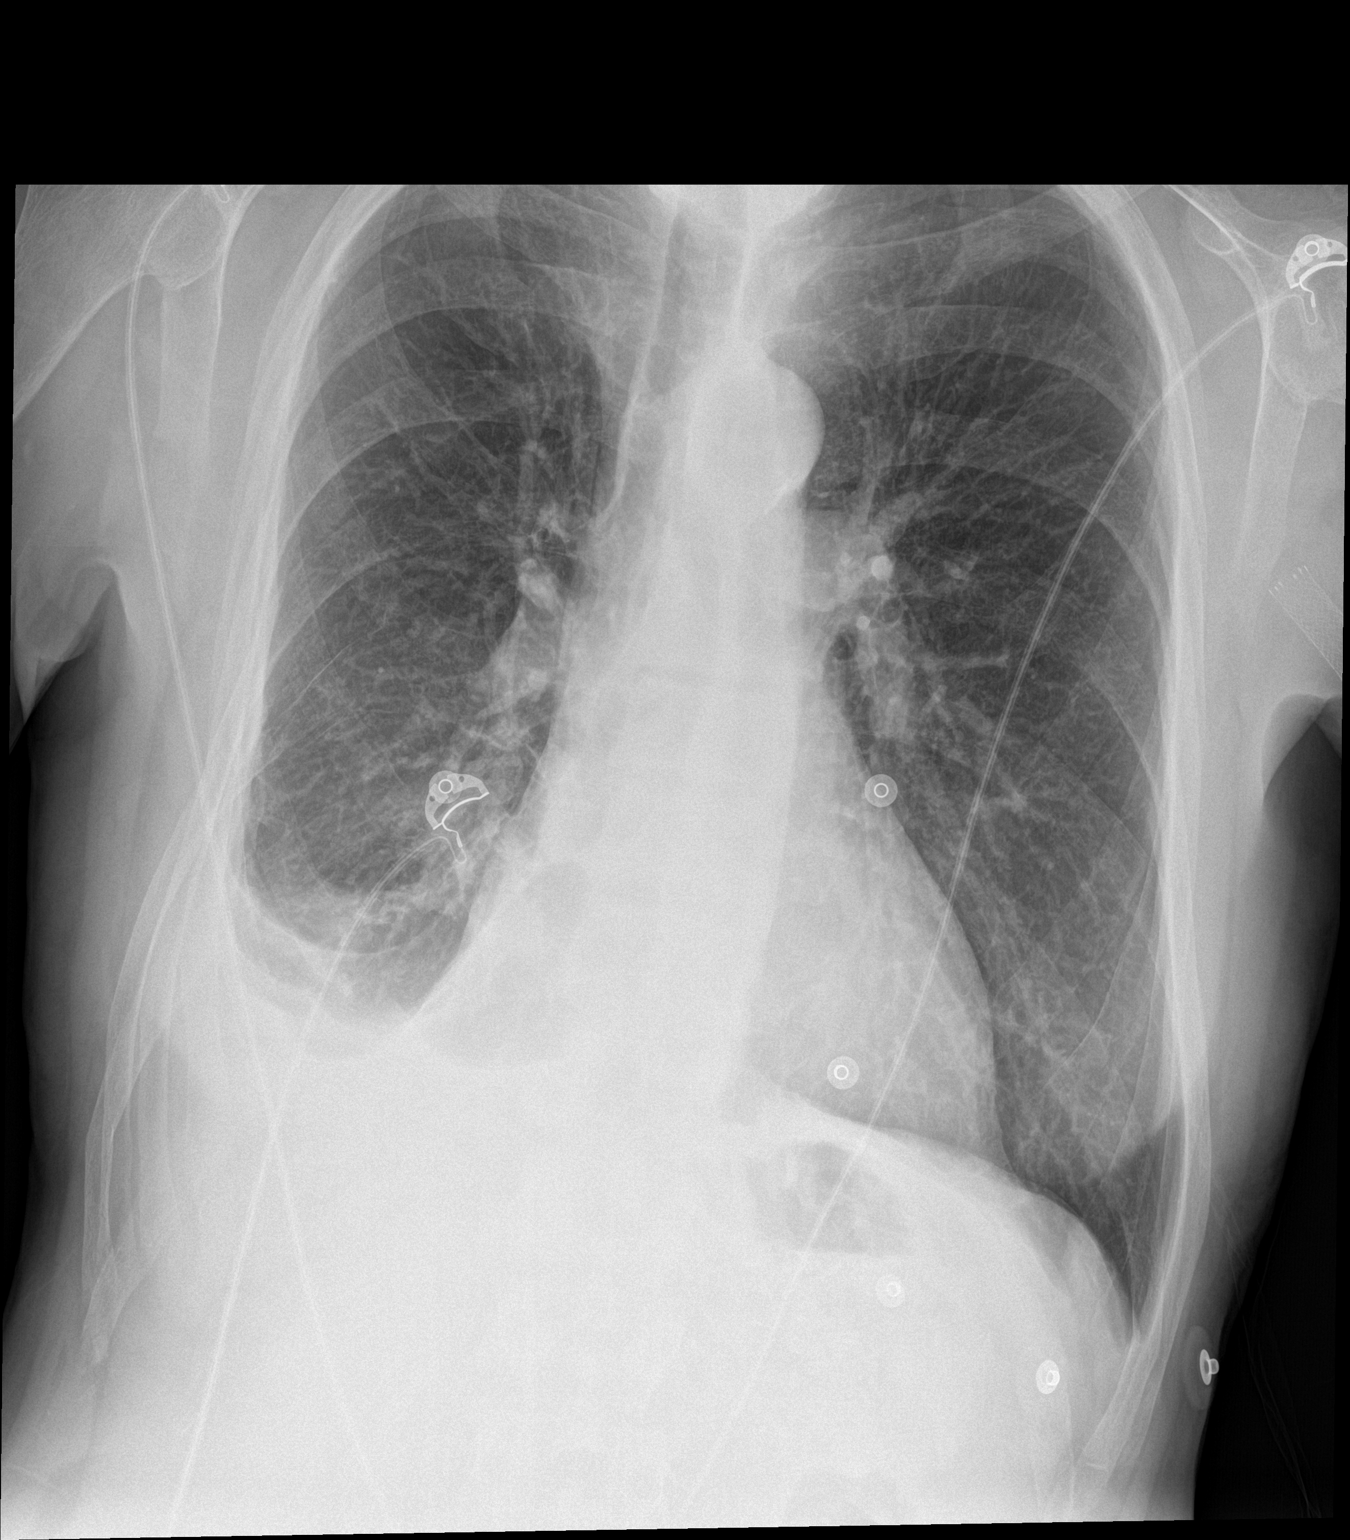

[chest lat]
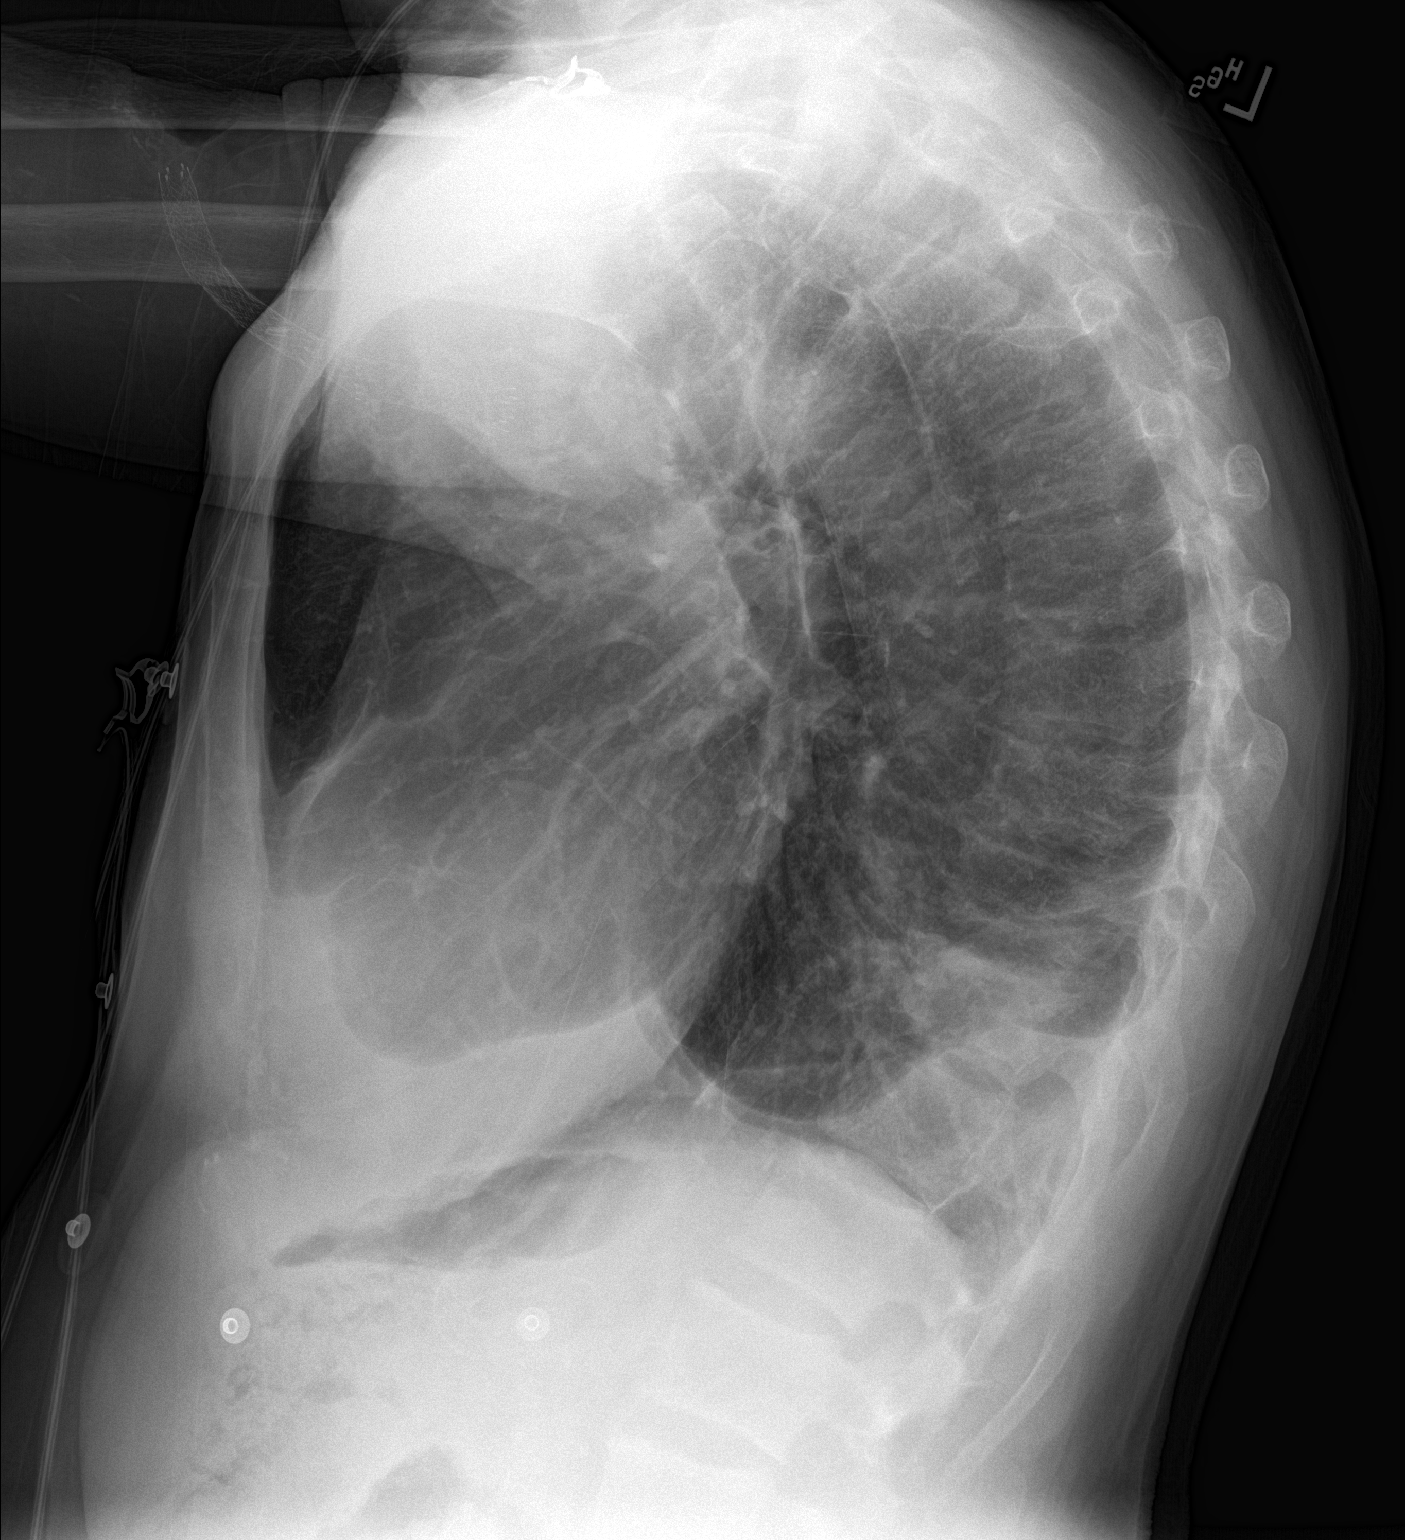

[2 of 2 positions shown; findings below may reference images not displayed]

FINDINGS: Small right pleural effusion. Adjacent atelectasis/ consolidation in
the basilar segments right lower lobe. Coarse interstitial markings
through both lungs. No pneumothorax.

Heart size upper limits normal.  Atheromatous aorta.

Visualized bones unremarkable.
IMPRESSION: 1. Small right pleural effusion.
2.  Aortic Atherosclerosis ([A1]-170.0)

## 2016-03-20 MED ORDER — METOPROLOL TARTRATE 50 MG PO TABS
50.0000 mg | ORAL_TABLET | Freq: Once | ORAL | Status: AC
  Administered 2016-03-20: 50 mg via ORAL
  Filled 2016-03-20: qty 1

## 2016-03-20 MED ORDER — SODIUM CHLORIDE 0.9 % IV BOLUS (SEPSIS)
250.0000 mL | Freq: Once | INTRAVENOUS | Status: AC
  Administered 2016-03-20: 250 mL via INTRAVENOUS

## 2016-03-20 MED ORDER — DILTIAZEM HCL 100 MG IV SOLR
5.0000 mg/h | Freq: Once | INTRAVENOUS | Status: DC
  Filled 2016-03-20: qty 100

## 2016-03-20 MED ORDER — DILTIAZEM HCL 25 MG/5ML IV SOLN
15.0000 mg | Freq: Once | INTRAVENOUS | Status: AC
  Administered 2016-03-20: 15 mg via INTRAVENOUS
  Filled 2016-03-20: qty 5

## 2016-03-20 NOTE — ED Triage Notes (Signed)
Pt came to ED from dialysis c/o uncontrolled afib. Pt normally at dualysis for 3 hours, stopped at 2 hours because of his heart rate. Pt denies pain, was c/o nausea earlier but reports feels better now. Denies chest pain.

## 2016-03-20 NOTE — ED Provider Notes (Signed)
Scott Castro    First MD Initiated Contact with Patient 03/20/16 1424     (approximate)  I have reviewed the triage vital signs and the nursing notes.   HISTORY  Chief Complaint Atrial Fibrillation    HPI Scott Castro is a 60 y.o. male with a history of chronic kidney disease on Tuesday Thursday Saturday dialysis as well as remote history of paroxysmal A. fib presents from dialysis due to tachycardia and lightheadedness. Patient states he is otherwise feeling well before going to dialysis. States that he has not taken his blood pressure medications in 2-3 days as he states his blood pressure is been normal. Since it is not on any anticoagulation or antiplatelet therapy. Upon arrival to ER he is tachycardic to 150 with an irregularly irregular rate and rhythm. Patient denies any active chest pain.  States that these symptoms started roughly 1 hour prior to arrival during dialysis. Prior to initiation of dialysis today he had a normal rate suggested this indeed started during dialysis. He denies any other complaints at this time.   Past Medical History:  Diagnosis Date  . Barrett's esophagus   . Chronic kidney disease    CHRONIC\  . Glomerulosclerosis, focal   . Hyperparathyroidism due to renal insufficiency (HCC)     There are no active problems to display for this patient.   Past Surgical History:  Procedure Laterality Date  . AV FISTULA PLACEMENT    . DG AV DIALYSIS GRAFT DECLOT OR    . FLEXIBLE BRONCHOSCOPY    . KIDNEY TRANSPLANT Right 1985  . REMOVAL TENCKHOFF CATH      Prior to Admission medications   Medication Sig Start Date End Date Taking? Authorizing Provider  aspirin 81 MG tablet Take 81 mg by mouth daily.    Historical Provider, MD  cinacalcet (SENSIPAR) 30 MG tablet Take 60 mg by mouth daily.    Historical Provider, MD  cloNIDine (CATAPRES) 0.1 MG tablet Take 0.1 mg by mouth 2 (two) times daily.     Historical Provider, MD  cloNIDine (CATAPRES) 0.3 MG tablet Take 0.3 mg by mouth 3 (three) times daily.    Historical Provider, MD  enalapril (VASOTEC) 20 MG tablet Take 20 mg by mouth daily.    Historical Provider, MD  esomeprazole (NEXIUM) 40 MG capsule Take 40 mg by mouth daily at 12 noon.    Historical Provider, MD  levETIRAcetam (KEPPRA) 1000 MG tablet Take 1,000 mg by mouth 2 (two) times daily.    Historical Provider, MD  levETIRAcetam (KEPPRA) 500 MG tablet Take 500 mg by mouth 3 (three) times a week.    Historical Provider, MD  metoprolol (LOPRESSOR) 100 MG tablet Take 100 mg by mouth 2 (two) times daily.    Historical Provider, MD  metoprolol (LOPRESSOR) 50 MG tablet Take 50 mg by mouth 2 (two) times daily.    Historical Provider, MD  minoxidil (LONITEN) 2.5 MG tablet Take by mouth 2 (two) times daily.    Historical Provider, MD  multivitamin (RENA-VIT) TABS tablet Take 1 tablet by mouth daily.    Historical Provider, MD  ondansetron (ZOFRAN) 8 MG tablet Take 8 mg by mouth every 8 (eight) hours as needed for nausea or vomiting.    Historical Provider, MD  pravastatin (PRAVACHOL) 20 MG tablet Take 20 mg by mouth daily.    Historical Provider, MD  sevelamer carbonate (RENVELA) 800 MG tablet Take 800 mg by mouth 3 (three) times  daily with meals.    Historical Provider, MD    Allergies Review of patient's allergies indicates no known allergies.  No family history on file.  Social History Social History  Substance Use Topics  . Smoking status: Never Smoker  . Smokeless tobacco: Never Used  . Alcohol use No    Review of Systems Patient denies headaches, rhinorrhea, blurry vision, numbness, shortness of breath, chest pain, edema, cough, abdominal pain, nausea, vomiting, diarrhea, dysuria, fevers, rashes or hallucinations unless otherwise stated above in HPI. ____________________________________________   PHYSICAL EXAM:  VITAL SIGNS: Vitals:   03/20/16 1530 03/20/16 1600  BP:  132/65 130/65  Pulse: (!) 101 91  Resp: 18 14  Temp:      Constitutional: Alert and oriented. Well appearing and in no acute distress. Eyes: Conjunctivae are normal. PERRL. EOMI. Head: Atraumatic. Nose: No congestion/rhinnorhea. Mouth/Throat: Mucous membranes are moist.  Oropharynx non-erythematous. Neck: No stridor. Painless ROM. No cervical spine tenderness to palpation Hematological/Lymphatic/Immunilogical: No cervical lymphadenopathy. Cardiovascular: Normal rate, regular rhythm. Grossly normal heart sounds.  Good peripheral circulation.  Left AV fistula with palpable frill Respiratory: Normal respiratory effort.  No retractions. Lungs CTAB. Gastrointestinal: Soft and nontender. No distention. No abdominal bruits. No CVA tenderness. Genitourinary:  Musculoskeletal: No lower extremity tenderness nor edema.  No joint effusions. Neurologic:  Normal speech and language. No gross focal neurologic deficits are appreciated. No gait instability. Skin:  Skin is warm, dry and intact. No rash noted. Psychiatric: Mood and affect are normal. Speech and behavior are normal.  ____________________________________________   LABS (all labs ordered are listed, but only abnormal results are displayed)  Results for orders placed or performed during the hospital encounter of 03/20/16 (from the past 24 hour(s))  CBC     Status: Abnormal   Collection Time: 03/20/16  2:34 PM  Result Value Ref Range   WBC 5.4 3.8 - 10.6 K/uL   RBC 3.87 (L) 4.40 - 5.90 MIL/uL   Hemoglobin 12.3 (L) 13.0 - 18.0 g/dL   HCT 36.8 (L) 40.0 - 52.0 %   MCV 95.2 80.0 - 100.0 fL   MCH 31.8 26.0 - 34.0 pg   MCHC 33.4 32.0 - 36.0 g/dL   RDW 14.9 (H) 11.5 - 14.5 %   Platelets 200 150 - 440 K/uL  Comprehensive metabolic panel     Status: Abnormal   Collection Time: 03/20/16  2:34 PM  Result Value Ref Range   Sodium 136 135 - 145 mmol/L   Potassium 4.4 3.5 - 5.1 mmol/L   Chloride 96 (L) 101 - 111 mmol/L   CO2 28 22 - 32  mmol/L   Glucose, Bld 91 65 - 99 mg/dL   BUN 23 (H) 6 - 20 mg/dL   Creatinine, Ser 6.28 (H) 0.61 - 1.24 mg/dL   Calcium 8.6 (L) 8.9 - 10.3 mg/dL   Total Protein 7.6 6.5 - 8.1 g/dL   Albumin 3.9 3.5 - 5.0 g/dL   AST 12 (L) 15 - 41 U/L   ALT 8 (L) 17 - 63 U/L   Alkaline Phosphatase 116 38 - 126 U/L   Total Bilirubin 0.3 0.3 - 1.2 mg/dL   GFR calc non Af Amer 9 (L) >60 mL/min   GFR calc Af Amer 10 (L) >60 mL/min   Anion gap 12 5 - 15  Protime-INR     Status: None   Collection Time: 03/20/16  2:34 PM  Result Value Ref Range   Prothrombin Time 13.7 11.4 - 15.2 seconds  INR 1.05   Magnesium     Status: None   Collection Time: 03/20/16  2:34 PM  Result Value Ref Range   Magnesium 2.1 1.7 - 2.4 mg/dL   ____________________________________________  EKG  Time: 14:42  Indication: tachycardia  Rate: 150  Rhythm: Afib with RVR Axis: normal Other: non specific ST changes, likely rate dependent ____________________________________________  RADIOLOGY  CXR  my read shows no evidence of acute cardiopulmonary process.  ____________________________________________   PROCEDURES  Procedure(s) performed: none    Critical Care performed: no ____________________________________________   INITIAL IMPRESSION / ASSESSMENT AND PLAN / ED COURSE  Pertinent labs & imaging results that were available during my care of the patient were reviewed by me and considered in my medical decision making (see chart for details).  DDX: Dysrhythmia, dehydration, ACS, electrolyte abnormality, medication noncompliance  JAQUARRIUS TRISTAN is a 60 y.o. who presents to the ED with acute onset atrial fibrillation with RVR while at dialysis. Patient arrives tachycardic but well-perfused and otherwise stable. Patient has a good history for time of onset being roughly 1 hour prior to arrival. States he had a remote history of A. fib and states that he is been off his beta blocker for the past 2 days. As he is currently  stable we will check electrolytes for any underlying abnormalities particularly as the patient is. Also provide a small IV fluid bolus in addition to a bolus dose of IV Cardizem for rate control. Patient will be kept on telemetry monitor. I'll check chest x-ray to evaluate for any signs of congestive heart failure. Patient afebrile and I have a low suspicion for underlying infectious process.  Clinical Course  Comment By Time  Rechecked patient and he has converted to sinus rhythm.  Now stating that he has not taken his beta blocker in the past 2 days because " his blood pressure did not need them."  I will re-dose his home metoprolol. Merlyn Lot, MD 08/12 1502  Will continue to monitor patient on telemetry. Potassium appears normal. Magnesium normal Merlyn Lot, MD 08/12 1511  Patient remains hemodynamically stable and in a sinus rhythm after receiving his by mouth metoprolol. As his heart rate is well-controlled on his home dose of beta blocker and I would expect to admit exceeded the half-life of the single IV Cardizem bolus and expect his symptoms to be managed with his home medications at this time. Merlyn Lot, MD 08/12 [phone]  Have discussed with the patient and available family all diagnostics and treatments performed thus far and all questions were answered to the best of my ability. The patient demonstrates understanding and agreement with plan.  Merlyn Lot, MD 08/12 1609     ____________________________________________   FINAL CLINICAL IMPRESSION(S) / ED DIAGNOSES  Final diagnoses:  Paroxysmal atrial fibrillation with RVR (HCC)  ESRD (end stage renal disease) on dialysis [facility])      NEW MEDICATIONS STARTED DURING THIS VISIT:  New Prescriptions   No medications on file     Castro:  This document was prepared using Dragon voice recognition software and may include unintentional dictation errors.    Merlyn Lot, MD 03/20/16 [phone]

## 2016-03-22 ENCOUNTER — Encounter: Payer: Self-pay | Admitting: Anesthesiology

## 2016-03-22 ENCOUNTER — Ambulatory Visit
Admission: RE | Admit: 2016-03-22 | Discharge: 2016-03-22 | Disposition: A | Discharge status: Home / Self Care | Payer: Medicare Other | Source: Ambulatory Visit | Attending: Unknown Physician Specialty | Admitting: Unknown Physician Specialty

## 2016-03-22 ENCOUNTER — Encounter
Admission: RE | Disposition: A | Discharge status: Home / Self Care | Payer: Self-pay | Source: Ambulatory Visit | Attending: Unknown Physician Specialty

## 2016-03-22 ENCOUNTER — Ambulatory Visit: Payer: Medicare Other | Admitting: Anesthesiology

## 2016-03-22 DIAGNOSIS — K2270 Barrett's esophagus without dysplasia: Secondary | ICD-10-CM | POA: Diagnosis not present

## 2016-03-22 DIAGNOSIS — Z992 Dependence on renal dialysis: Secondary | ICD-10-CM | POA: Insufficient documentation

## 2016-03-22 DIAGNOSIS — Z1211 Encounter for screening for malignant neoplasm of colon: Secondary | ICD-10-CM | POA: Diagnosis not present

## 2016-03-22 DIAGNOSIS — I120 Hypertensive chronic kidney disease with stage 5 chronic kidney disease or end stage renal disease: Secondary | ICD-10-CM | POA: Insufficient documentation

## 2016-03-22 DIAGNOSIS — K640 First degree hemorrhoids: Secondary | ICD-10-CM | POA: Insufficient documentation

## 2016-03-22 DIAGNOSIS — K210 Gastro-esophageal reflux disease with esophagitis: Secondary | ICD-10-CM | POA: Insufficient documentation

## 2016-03-22 DIAGNOSIS — Z940 Kidney transplant status: Secondary | ICD-10-CM | POA: Insufficient documentation

## 2016-03-22 DIAGNOSIS — D123 Benign neoplasm of transverse colon: Secondary | ICD-10-CM | POA: Diagnosis not present

## 2016-03-22 DIAGNOSIS — Z79899 Other long term (current) drug therapy: Secondary | ICD-10-CM | POA: Insufficient documentation

## 2016-03-22 DIAGNOSIS — K2950 Unspecified chronic gastritis without bleeding: Secondary | ICD-10-CM | POA: Insufficient documentation

## 2016-03-22 DIAGNOSIS — Z7982 Long term (current) use of aspirin: Secondary | ICD-10-CM | POA: Insufficient documentation

## 2016-03-22 DIAGNOSIS — Z86010 Personal history of colonic polyps: Secondary | ICD-10-CM | POA: Insufficient documentation

## 2016-03-22 DIAGNOSIS — N186 End stage renal disease: Secondary | ICD-10-CM | POA: Diagnosis not present

## 2016-03-22 HISTORY — PX: COLONOSCOPY: SHX5424

## 2016-03-22 HISTORY — DX: Barrett's esophagus without dysplasia: K22.70

## 2016-03-22 HISTORY — DX: Secondary hyperparathyroidism of renal origin: N25.81

## 2016-03-22 HISTORY — DX: Unspecified nephritic syndrome with focal and segmental glomerular lesions: N05.1

## 2016-03-22 HISTORY — PX: ESOPHAGOGASTRODUODENOSCOPY (EGD) WITH PROPOFOL: SHX5813

## 2016-03-22 HISTORY — DX: Chronic kidney disease, unspecified: N18.9

## 2016-03-22 SURGERY — COLONOSCOPY
Anesthesia: General

## 2016-03-22 MED ORDER — SODIUM CHLORIDE 0.9 % IV SOLN
INTRAVENOUS | Status: DC
  Administered 2016-03-22: 500 mL via INTRAVENOUS

## 2016-03-22 MED ORDER — ONDANSETRON HCL 4 MG/2ML IJ SOLN: 4.0000 mg | Freq: Once | INTRAMUSCULAR | Status: DC | PRN

## 2016-03-22 MED ORDER — PHENYLEPHRINE HCL 10 MG/ML IJ SOLN
INTRAMUSCULAR | Status: DC | PRN
  Administered 2016-03-22: 100 ug via INTRAVENOUS
  Administered 2016-03-22: 200 ug via INTRAVENOUS

## 2016-03-22 MED ORDER — FENTANYL CITRATE (PF) 100 MCG/2ML IJ SOLN
INTRAMUSCULAR | Status: DC | PRN
  Administered 2016-03-22: 50 ug via INTRAVENOUS

## 2016-03-22 MED ORDER — FENTANYL CITRATE (PF) 100 MCG/2ML IJ SOLN: 25.0000 ug | INTRAMUSCULAR | Status: DC | PRN

## 2016-03-22 MED ORDER — PROPOFOL 500 MG/50ML IV EMUL
INTRAVENOUS | Status: DC | PRN
  Administered 2016-03-22: 100 ug/kg/min via INTRAVENOUS

## 2016-03-22 MED ORDER — SODIUM CHLORIDE 0.9 % IV SOLN: INTRAVENOUS | Status: DC

## 2016-03-22 MED ORDER — EPHEDRINE SULFATE 50 MG/ML IJ SOLN
INTRAMUSCULAR | Status: DC | PRN
  Administered 2016-03-22: 15 mg via INTRAVENOUS
  Administered 2016-03-22: 10 mg via INTRAVENOUS

## 2016-03-22 MED ORDER — MIDAZOLAM HCL 5 MG/5ML IJ SOLN
INTRAMUSCULAR | Status: DC | PRN
  Administered 2016-03-22: 1 mg via INTRAVENOUS

## 2016-03-22 MED ORDER — LIDOCAINE HCL (PF) 2 % IJ SOLN
INTRAMUSCULAR | Status: DC | PRN
  Administered 2016-03-22: 50 mg

## 2016-03-22 MED ORDER — PROPOFOL 10 MG/ML IV BOLUS
INTRAVENOUS | Status: DC | PRN
  Administered 2016-03-22: 30 mg via INTRAVENOUS
  Administered 2016-03-22: 20 mg via INTRAVENOUS

## 2016-03-22 NOTE — Op Note (Signed)
[facility] Patient Name: Scott Castro Procedure Date: 03/22/2016 10:19 AM MRN: [id number] Account #: [id number] Date of Birth: 1955/09/26 Admit Type: Outpatient Age: 60 Room: [facility] ENDO ROOM 1 Gender: Male Note Status: Finalized Procedure:            Upper GI endoscopy Indications:          Heartburn, Follow-up of Barrett's esophagus Providers:            Manya Silvas, MD Referring MD:         Justin Mend (Referring MD) Medicines:            Propofol per Anesthesia Complications:        No immediate complications. Procedure:            Pre-Anesthesia Assessment:                       - After reviewing the risks and benefits, the patient                        was deemed in satisfactory condition to undergo the                        procedure.                       After obtaining informed consent, the endoscope was                        passed under direct vision. Throughout the procedure,                        the patient's blood pressure, pulse, and oxygen                        saturations were monitored continuously. The Endoscope                        was introduced through the mouth, and advanced to the                        second part of duodenum. The upper GI endoscopy was                        accomplished without difficulty. The patient tolerated                        the procedure well. Findings:      There were esophageal mucosal changes secondary to established       short-segment Barrett's disease present in the lower third of the       esophagus. The maximum longitudinal extent of these mucosal changes was       1 cm in length. Mucosa was biopsied with a cold forceps for histology.       One specimen bottle was sent to pathology. GEJ 41cm.      Localized mild inflammation characterized by congestion (edema) and       erythema was found in the gastric antrum. Biopsies were taken with a       cold forceps for  histology. Biopsies were taken with a cold forceps for       Helicobacter pylori  testing.      Localized mild inflammation characterized by erythema and granularity       was found in the duodenal bulb. Impression:           - Esophageal mucosal changes secondary to established                        short-segment Barrett's disease. Biopsied.                       - Gastritis. Biopsied.                       - Normal examined duodenum. Recommendation:       - Await pathology results. Manya Silvas, MD 03/22/2016 10:33:59 AM This report has been signed electronically. Number of Addenda: 0 Note Initiated On: 03/22/2016 10:19 AM      [facility]

## 2016-03-22 NOTE — Anesthesia Preprocedure Evaluation (Addendum)
Anesthesia Evaluation  Patient identified by MRN, date of birth, ID band Patient awake    Reviewed: Allergy & Precautions, NPO status , Patient's Chart, lab work & pertinent test results, reviewed documented beta blocker date and time   Airway Mallampati: II  TM Distance: >3 FB     Dental  (+) Poor Dentition   Pulmonary neg pulmonary ROS,    Pulmonary exam normal        Cardiovascular hypertension, Pt. on medications and Pt. on home beta blockers Normal cardiovascular exam     Neuro/Psych negative neurological ROS  negative psych ROS   GI/Hepatic Neg liver ROS, GERD  Medicated,  Endo/Other  negative endocrine ROS  Renal/GU Dialysis and ESRFRenal diseaseglomerulosclerosis  negative genitourinary   Musculoskeletal negative musculoskeletal ROS (+)   Abdominal Normal abdominal exam  (+)   Peds negative pediatric ROS (+)  Hematology negative hematology ROS (+)   Anesthesia Other Findings Dialysis catheter L upper arm.  R arm ok for BPs Dialysis days Tu/Th/Sat  Reproductive/Obstetrics                          Anesthesia Physical Anesthesia Plan  ASA: III  Anesthesia Plan: General   Post-op Pain Management:    Induction: Intravenous  Airway Management Planned: Nasal Cannula  Additional Equipment:   Intra-op Plan:   Post-operative Plan:   Informed Consent: I have reviewed the patients History and Physical, chart, labs and discussed the procedure including the risks, benefits and alternatives for the proposed anesthesia with the patient or authorized representative who has indicated his/her understanding and acceptance.   Dental advisory given  Plan Discussed with: CRNA and Surgeon  Anesthesia Plan Comments:         Anesthesia Quick Evaluation

## 2016-03-22 NOTE — Transfer of Care (Signed)
Immediate Anesthesia Transfer of Care Note  Patient: Scott Castro  Procedure(s) Performed: Procedure(s): COLONOSCOPY (N/A) ESOPHAGOGASTRODUODENOSCOPY (EGD) WITH PROPOFOL  Patient Location: PACU  Anesthesia Type:General  Level of Consciousness: sedated  Airway & Oxygen Therapy: Patient Spontanous Breathing and Patient connected to nasal cannula oxygen  Post-op Assessment: Report given to RN and Post -op Vital signs reviewed and stable  Post vital signs: Reviewed and stable  Last Vitals:  Vitals:   03/22/16 0919 03/22/16 1100  BP: (!) 160/64 138/75  Pulse: (!) 59 68  Resp: 16 19  Temp: 36.6 C (!) 35.9 C    Last Pain:  Vitals:   03/22/16 1100  TempSrc: Tympanic         Complications: No apparent anesthesia complications

## 2016-03-22 NOTE — Op Note (Signed)
[facility] Gastroenterology Patient Name: Scott Castro Procedure Date: 03/22/2016 10:18 AM MRN: [id number] Account #: [id number] Date of Birth: [birth year] Admit Type: Outpatient Age: 60 Room: [facility] ENDO ROOM 1 Gender: Male Note Status: Finalized Procedure:            Colonoscopy Indications:          High risk colon cancer surveillance: Personal history                        of colonic polyps Providers:            Manya Silvas, MD Referring MD:         Justin Mend (Referring MD) Medicines:            Propofol per Anesthesia Complications:        No immediate complications. Procedure:            Pre-Anesthesia Assessment:                       - After reviewing the risks and benefits, the patient                        was deemed in satisfactory condition to undergo the                        procedure.                       After obtaining informed consent, the colonoscope was                        passed under direct vision. Throughout the procedure,                        the patient's blood pressure, pulse, and oxygen                        saturations were monitored continuously. The Olympus                        PCF-H180AL colonoscope ( S#: [id number] ) was introduced                        through the anus and advanced to the the cecum,                        identified by appendiceal orifice and ileocecal valve.                        The colonoscopy was performed without difficulty. The                        patient tolerated the procedure well. The quality of                        the bowel preparation was excellent. Findings:      A diminutive polyp was found in the transverse colon. The polyp was       sessile. The polyp was removed with a jumbo cold forceps. Resection and       retrieval were complete.  Internal hemorrhoids were found during endoscopy. The hemorrhoids were       small and Grade I (internal hemorrhoids that do not  prolapse).      The exam was otherwise without abnormality. Impression:           - One diminutive polyp in the transverse colon, removed                        with a jumbo cold forceps. Resected and retrieved.                       - Internal hemorrhoids.                       - The examination was otherwise normal. Recommendation:       - Await pathology results. Manya Silvas, MD 03/22/2016 10:55:15 AM This report has been signed electronically. Number of Addenda: 0 Note Initiated On: 03/22/2016 10:18 AM Scope Withdrawal Time: 0 hours 6 minutes 12 seconds  Total Procedure Duration: 0 hours 16 minutes 37 seconds       [facility]

## 2016-03-22 NOTE — H&P (Signed)
Primary Care Physician:  Pcp Not In System Primary Gastroenterologist:  Dr. Vira Agar  Pre-Procedure History & Physical HPI:  Scott Castro is a 60 y.o. male is here for an endoscopy and colonoscopy.   Past Medical History:  Diagnosis Date  . Barrett's esophagus   . Chronic kidney disease    CHRONIC\  . Glomerulosclerosis, focal   . Hyperparathyroidism due to renal insufficiency [facility])     Past Surgical History:  Procedure Laterality Date  . AV FISTULA PLACEMENT    . DG AV DIALYSIS GRAFT DECLOT OR    . FLEXIBLE BRONCHOSCOPY    . KIDNEY TRANSPLANT Right 1985  . REMOVAL TENCKHOFF CATH      Prior to Admission medications   Medication Sig Start Date End Date Taking? Authorizing Provider  aspirin 81 MG tablet Take 81 mg by mouth daily.   Yes Historical Provider, MD  cinacalcet (SENSIPAR) 30 MG tablet Take 60 mg by mouth daily.   Yes Historical Provider, MD  cloNIDine (CATAPRES) 0.1 MG tablet Take 0.1 mg by mouth 2 (two) times daily.   Yes Historical Provider, MD  cloNIDine (CATAPRES) 0.3 MG tablet Take 0.3 mg by mouth 3 (three) times daily.   Yes Historical Provider, MD  enalapril (VASOTEC) 20 MG tablet Take 20 mg by mouth daily.   Yes Historical Provider, MD  esomeprazole (NEXIUM) 40 MG capsule Take 40 mg by mouth daily at 12 noon.   Yes Historical Provider, MD  levETIRAcetam (KEPPRA) 1000 MG tablet Take 1,000 mg by mouth 2 (two) times daily.   Yes Historical Provider, MD  levETIRAcetam (KEPPRA) 500 MG tablet Take 500 mg by mouth 3 (three) times a week.   Yes Historical Provider, MD  metoprolol (LOPRESSOR) 100 MG tablet Take 100 mg by mouth 2 (two) times daily.   Yes Historical Provider, MD  metoprolol (LOPRESSOR) 50 MG tablet Take 50 mg by mouth 2 (two) times daily.   Yes Historical Provider, MD  minoxidil (LONITEN) 2.5 MG tablet Take by mouth 2 (two) times daily.   Yes Historical Provider, MD  multivitamin (RENA-VIT) TABS tablet Take 1 tablet by mouth daily.   Yes Historical  Provider, MD  ondansetron (ZOFRAN) 8 MG tablet Take 8 mg by mouth every 8 (eight) hours as needed for nausea or vomiting.   Yes Historical Provider, MD  pravastatin (PRAVACHOL) 20 MG tablet Take 20 mg by mouth daily.   Yes Historical Provider, MD  sevelamer carbonate (RENVELA) 800 MG tablet Take 800 mg by mouth 3 (three) times daily with meals.   Yes Historical Provider, MD    Allergies as of 02/18/2016  . (Not on File)    History reviewed. No pertinent family history.  Social History   Social History  . Marital status: Single    Spouse name: N/A  . Number of children: N/A  . Years of education: N/A   Occupational History  . Not on file.   Social History Main Topics  . Smoking status: Never Smoker  . Smokeless tobacco: Never Used  . Alcohol use No  . Drug use: No  . Sexual activity: Not on file   Other Topics Concern  . Not on file   Social History Narrative  . No narrative on file    Review of Systems: See HPI, otherwise negative ROS  Physical Exam: BP (!) 160/64   Pulse (!) 59   Temp 97.9 F (36.6 C) (Tympanic)   Resp 16   Ht 5\' 10"  (1.778 m)  Wt 72.1 kg (159 lb)   SpO2 99%   BMI 22.81 kg/m  General:   Alert,  pleasant and cooperative in NAD Head:  Normocephalic and atraumatic. Neck:  Supple; no masses or thyromegaly. Lungs:  Clear throughout to auscultation.    Heart:  Regular rate and rhythm. Abdomen:  Soft, nontender and nondistended. Normal bowel sounds, without guarding, and without rebound.   Neurologic:  Alert and  oriented x4;  grossly normal neurologically.  Impression/Plan: Scott Castro is here for an endoscopy and colonoscopy to be performed for screening, GERD, follow up Barretts esophagus.  Risks, benefits, limitations, and alternatives regarding  endoscopy and colonoscopy have been reviewed with the patient.  Questions have been answered.  All parties agreeable.   Gaylyn Cheers, MD  03/22/2016, 10:14 AM

## 2016-03-22 NOTE — Anesthesia Postprocedure Evaluation (Signed)
Anesthesia Post Note  Patient: [name]  Procedure(s) Performed: Procedure(s) (LRB): COLONOSCOPY (N/A) ESOPHAGOGASTRODUODENOSCOPY (EGD) WITH PROPOFOL  Patient location during evaluation: PACU Anesthesia Type: General Level of consciousness: awake and alert and oriented Pain management: pain level controlled Vital Signs Assessment: post-procedure vital signs reviewed and stable Respiratory status: spontaneous breathing Cardiovascular status: blood pressure returned to baseline Anesthetic complications: no    Last Vitals:  Vitals:   03/22/16 1110 03/22/16 1120  BP: (!) 129/42 (!) 106/58  Pulse: 64 69  Resp: (!) 22 13  Temp:      Last Pain:  Vitals:   03/22/16 1100  TempSrc: Tympanic                 Laria Grimmett

## 2016-03-23 ENCOUNTER — Encounter: Payer: Self-pay | Admitting: Unknown Physician Specialty

## 2016-03-23 LAB — SURGICAL PATHOLOGY

## 2016-07-05 IMAGING — US US THORACENTESIS ASP PLEURAL SPACE W/IMG GUIDE
1 series · 3 of 3 positions shown · non-contrast
Comparison: none

INDICATION: End-stage renal disease. Shortness of breath. Pleural effusion seen
on outside chest x-ray. Request diagnostic and therapeutic right
thoracentesis.

[Series 1: us thoracentesis asp pleural space w/img guide · 0.32mm/px · 3 of 3 slices shown]
[im 1/3]
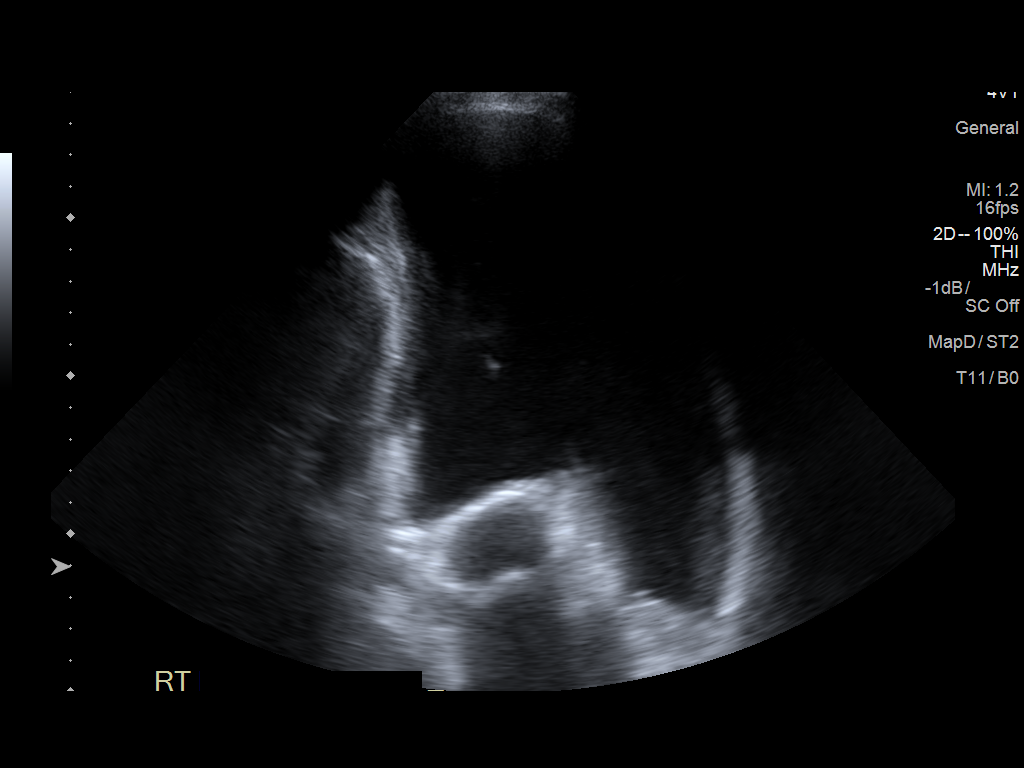
[im 2/3]
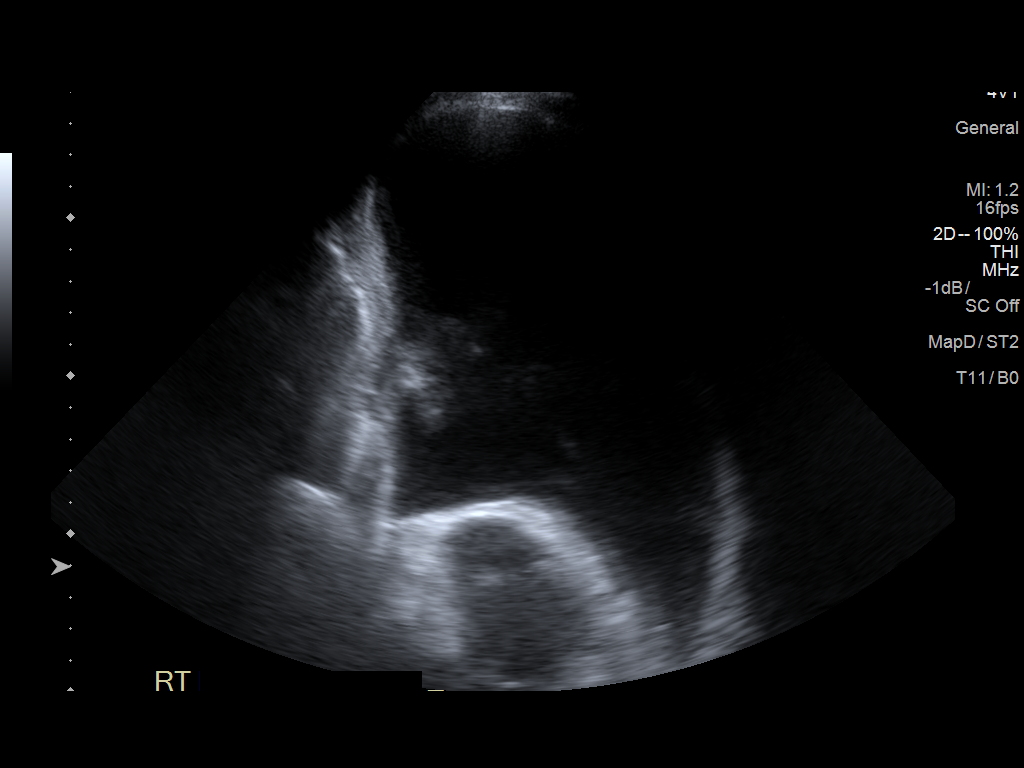
[im 3/3]
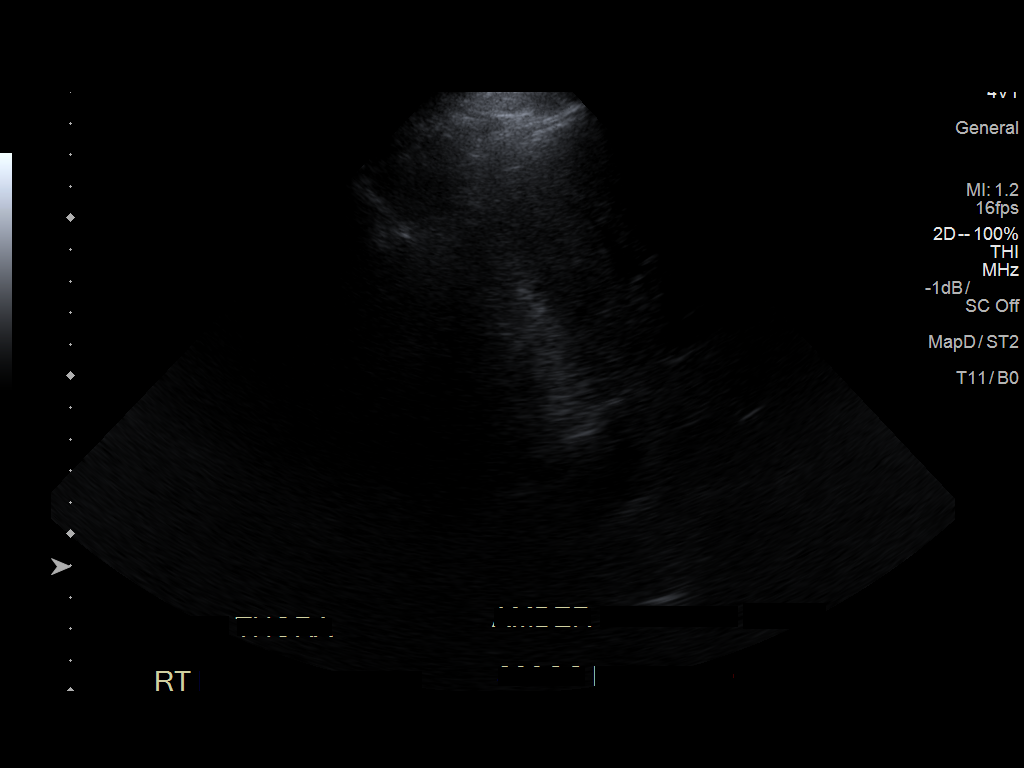

[3 of 3 positions shown; findings below may reference images not displayed]

EXAM:
ULTRASOUND GUIDED RIGHT THORACENTESIS

MEDICATIONS:
None.

COMPLICATIONS:
None immediate.

PROCEDURE:
An ultrasound guided thoracentesis was thoroughly discussed with the
patient and questions answered. The benefits, risks, alternatives
and complications were also discussed. The patient understands and
wishes to proceed with the procedure. Written consent was obtained.

Ultrasound was performed to localize and mark an adequate pocket of
fluid in the right chest. The area was then prepped and draped in
the normal sterile fashion. 1% Lidocaine was used for local
anesthesia. Under ultrasound guidance a Safe-T-Centesis catheter was
introduced. Thoracentesis was performed. The catheter was removed
and a dressing applied.
FINDINGS: A total of approximately 800 mL of slightly hazy, amber colored
fluid was removed. Samples were sent to the laboratory as requested
by the clinical team.
IMPRESSION: Successful ultrasound guided right thoracentesis yielding 800 mL of
pleural fluid.

## 2017-03-07 ENCOUNTER — Other Ambulatory Visit: Payer: Self-pay | Admitting: Specialist

## 2017-03-07 DIAGNOSIS — J90 Pleural effusion, not elsewhere classified: Secondary | ICD-10-CM

## 2017-03-11 ENCOUNTER — Ambulatory Visit
Admission: RE | Admit: 2017-03-11 | Discharge: 2017-03-11 | Disposition: A | Discharge status: Home / Self Care | Payer: Medicare Other | Source: Ambulatory Visit | Attending: Radiology | Admitting: Radiology

## 2017-03-11 ENCOUNTER — Ambulatory Visit
Admission: RE | Admit: 2017-03-11 | Discharge: 2017-03-11 | Disposition: A | Discharge status: Home / Self Care | Payer: Medicare Other | Source: Ambulatory Visit | Attending: Specialist | Admitting: Specialist

## 2017-03-11 DIAGNOSIS — Z9889 Other specified postprocedural states: Secondary | ICD-10-CM | POA: Insufficient documentation

## 2017-03-11 DIAGNOSIS — J90 Pleural effusion, not elsewhere classified: Secondary | ICD-10-CM | POA: Insufficient documentation

## 2017-03-11 LAB — GLUCOSE, PLEURAL OR PERITONEAL FLUID: Glucose, Fluid: 85 mg/dL

## 2017-03-11 LAB — PROTEIN, PLEURAL OR PERITONEAL FLUID: TOTAL PROTEIN, FLUID: 4.5 g/dL

## 2017-03-11 LAB — BODY FLUID CELL COUNT WITH DIFFERENTIAL
Lymphs, Fluid: 92 %
Monocyte-Macrophage-Serous Fluid: 7 %
Neutrophil Count, Fluid: 1 %
WBC FLUID: 59 uL

## 2017-03-11 LAB — LACTATE DEHYDROGENASE, PLEURAL OR PERITONEAL FLUID: LD, Fluid: 137 U/L — ABNORMAL HIGH (ref 3–23)

## 2017-03-11 IMAGING — DX DG CHEST 1V
1 series · 1 of 1 positions shown · non-contrast
Comparison: [DATE]

CLINICAL DATA: TEYA/TEYA on right side

EXAM:
CHEST  1 VIEW

[chest ap]
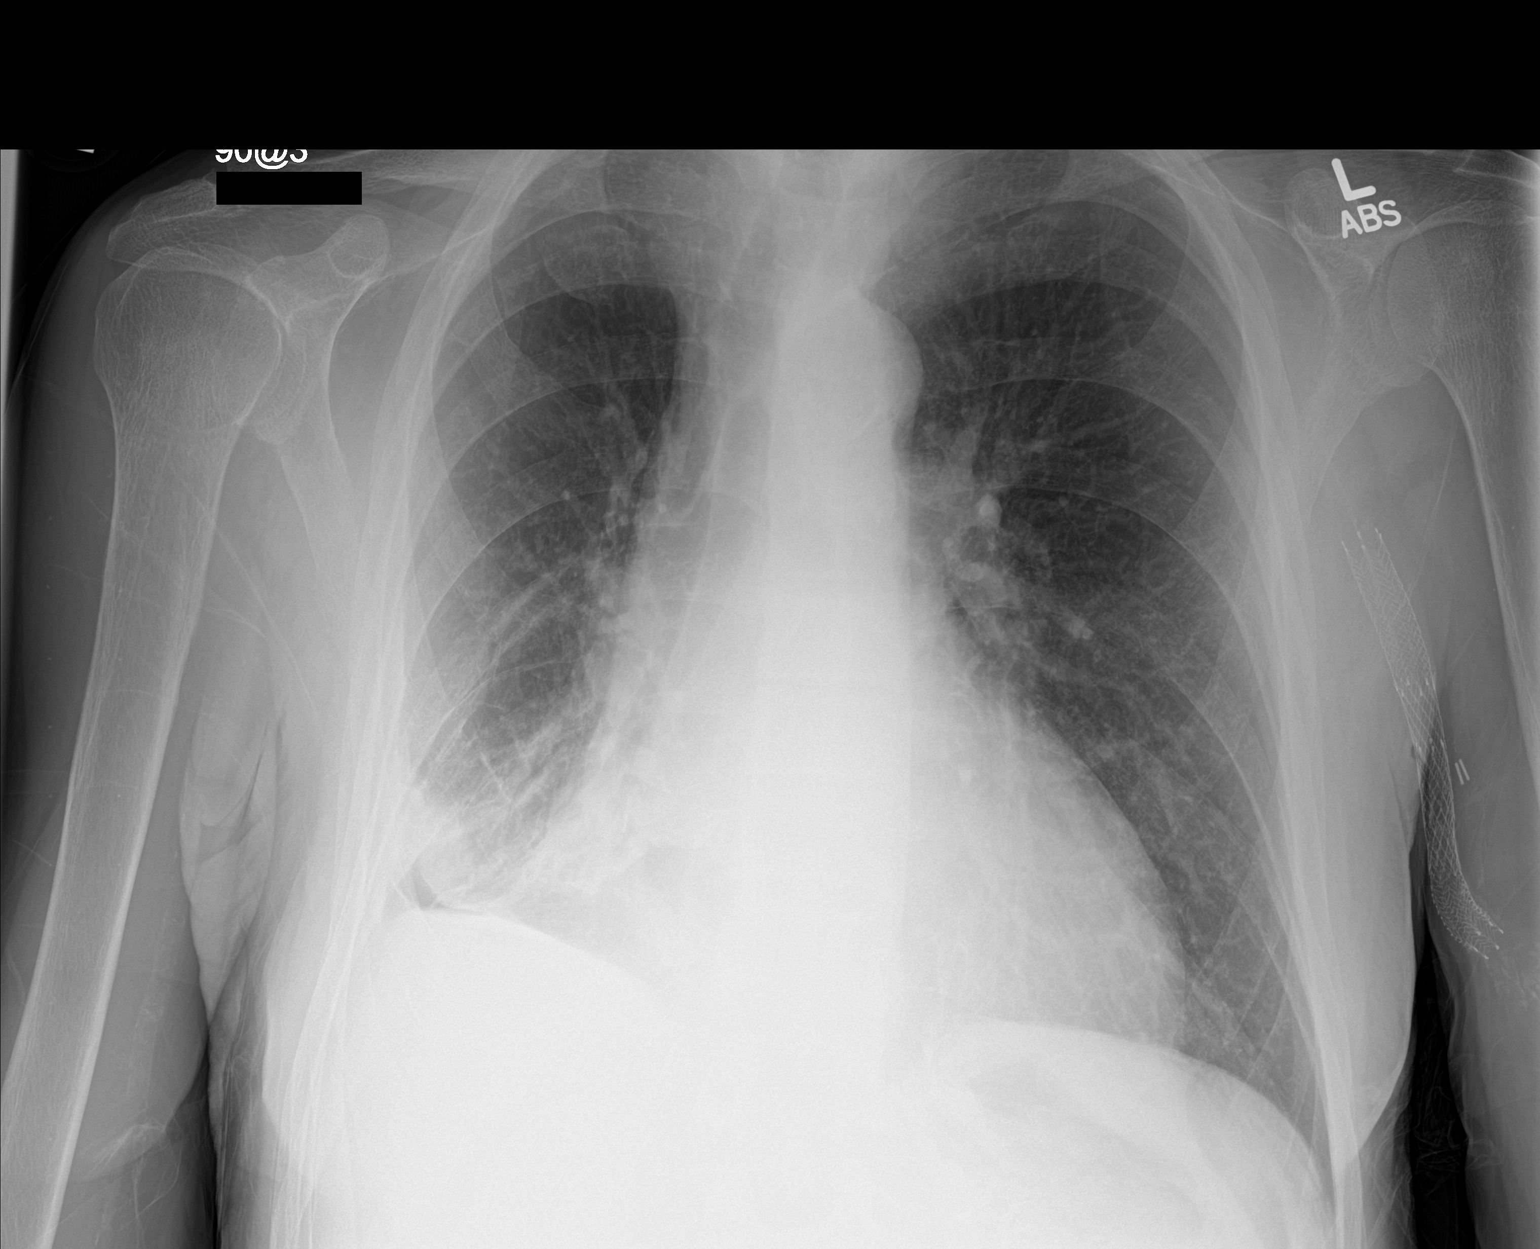

[1 of 1 positions shown; findings below may reference images not displayed]

FINDINGS: Decrease in right pleural effusion. Small gas density laterally at
the lung base. No other suggestion pneumothorax. Residual
subsegmental atelectasis or infiltrate in the right lower lung. Left
lung clear.

Mild cardiomegaly.  Atheromatous aorta.

Vascular stents noted in the left brachial region.

Visualized bones unremarkable.
IMPRESSION: 1. Possible small focus of is loculated gas laterally at the lung
base post thoracentesis, with reduction in pleural effusion.

## 2017-03-11 NOTE — Procedures (Signed)
PROCEDURE SUMMARY:  Successful US guided right thoracentesis. Yielded 800 mL of hazy, amber colored fluid. Pt tolerated procedure well. No immediate complications.  Specimen was sent for labs. CXR ordered.  Ascencion Dike PA-C 03/11/2017 11:32 AM

## 2017-03-14 LAB — BODY FLUID CULTURE: CULTURE: NO GROWTH

## 2017-03-14 LAB — CYTOLOGY - NON PAP

## 2017-03-28 ENCOUNTER — Other Ambulatory Visit: Payer: Self-pay | Admitting: Specialist

## 2017-03-28 DIAGNOSIS — J90 Pleural effusion, not elsewhere classified: Secondary | ICD-10-CM

## 2017-03-28 DIAGNOSIS — R918 Other nonspecific abnormal finding of lung field: Secondary | ICD-10-CM

## 2017-03-28 DIAGNOSIS — R0609 Other forms of dyspnea: Secondary | ICD-10-CM

## 2017-04-04 ENCOUNTER — Ambulatory Visit
Admission: RE | Admit: 2017-04-04 | Discharge: 2017-04-04 | Disposition: A | Discharge status: Home / Self Care | Payer: Medicare Other | Source: Ambulatory Visit | Attending: Specialist | Admitting: Specialist

## 2017-04-04 DIAGNOSIS — I2510 Atherosclerotic heart disease of native coronary artery without angina pectoris: Secondary | ICD-10-CM | POA: Insufficient documentation

## 2017-04-04 DIAGNOSIS — R0609 Other forms of dyspnea: Secondary | ICD-10-CM | POA: Diagnosis not present

## 2017-04-04 DIAGNOSIS — J90 Pleural effusion, not elsewhere classified: Secondary | ICD-10-CM | POA: Diagnosis not present

## 2017-04-04 DIAGNOSIS — R918 Other nonspecific abnormal finding of lung field: Secondary | ICD-10-CM | POA: Diagnosis present

## 2017-04-04 DIAGNOSIS — I700 Atherosclerosis of aorta: Secondary | ICD-10-CM | POA: Insufficient documentation

## 2017-04-04 IMAGING — CT CT CHEST W/O CM
1 series · 15 of 34 positions shown, 19 images · non-contrast
Comparison: Chest radiograph [DATE].

CLINICAL DATA: Persistent cough, thoracentesis earlier this month.

EXAM:
CT CHEST WITHOUT CONTRAST
TECHNIQUE: Multidetector CT imaging of the chest was performed following the
standard protocol without IV contrast.

[Series 2: thorax · axial · 0.68mm/px · z∈[-647,-345]mm · 15 of 179 slices shown, 19 images]
[im 14/179  mediastinal]
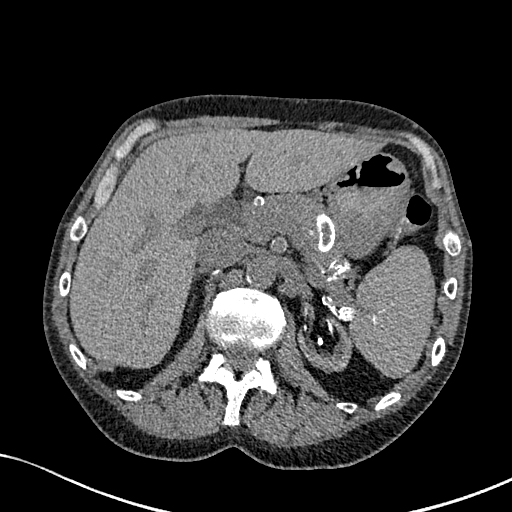
[im 14/179  lung]
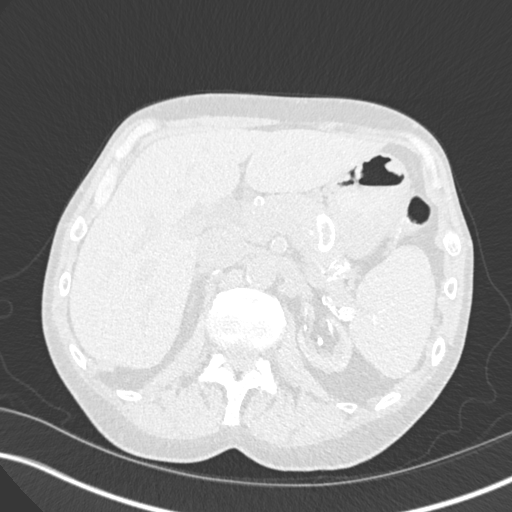
[im 27/179  lung]
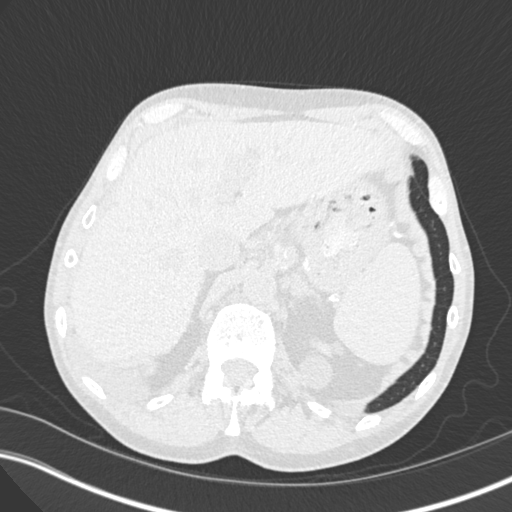
[im 36/179  lung]
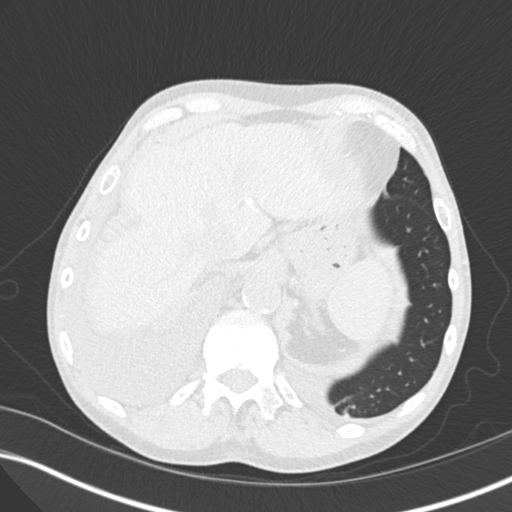
[im 47/179  lung]
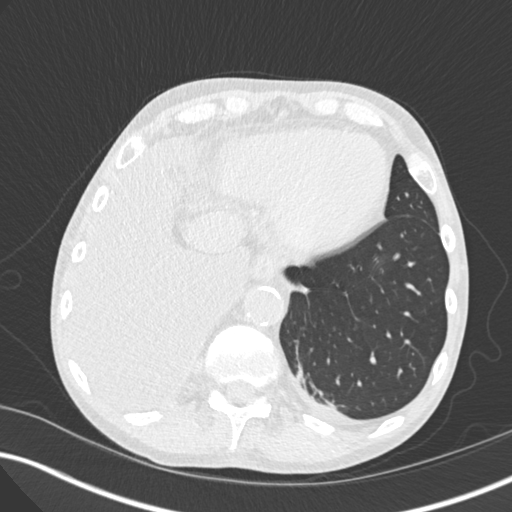
[im 60/179  mediastinal]
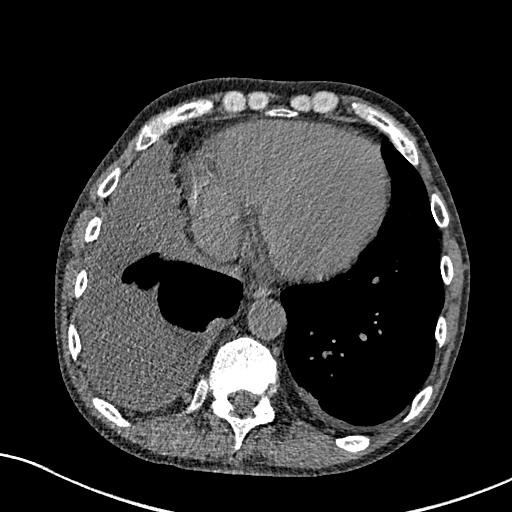
[im 60/179  lung]
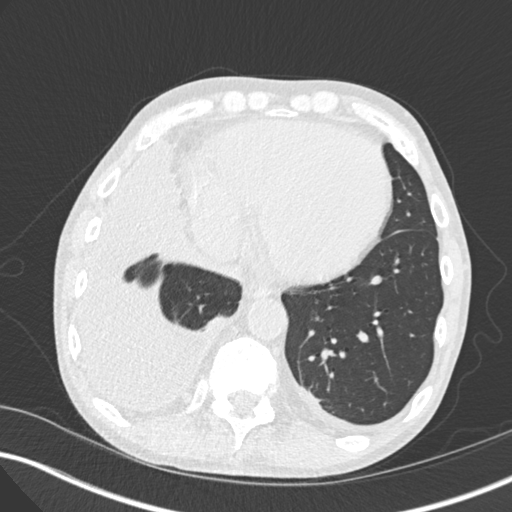
[im 72/179  lung]
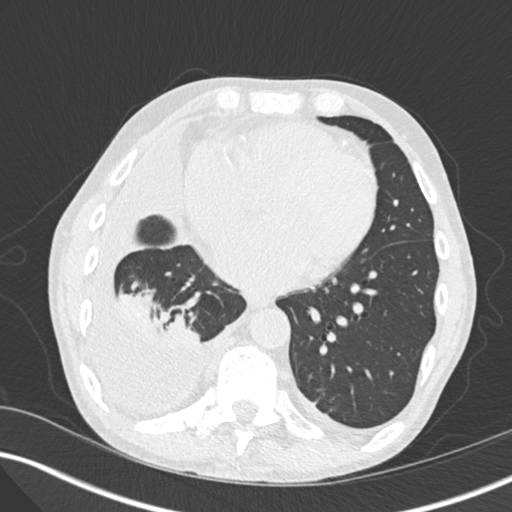
[im 80/179  lung]
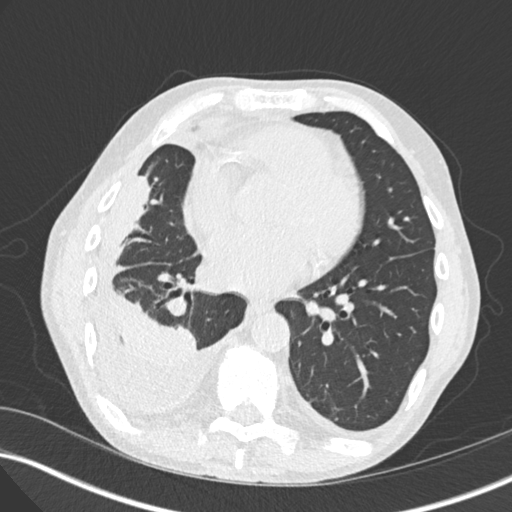
[im 93/179  lung]
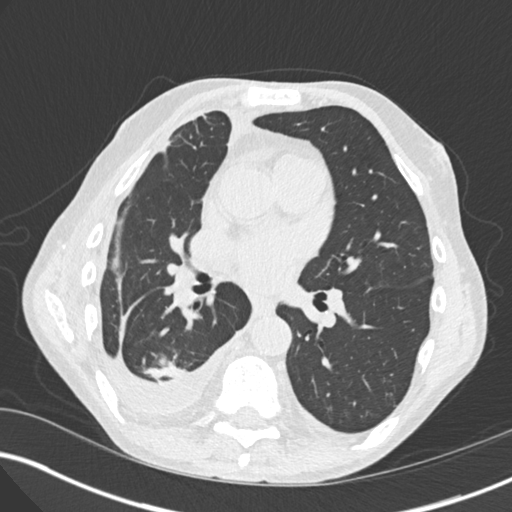
[im 99/179  mediastinal]
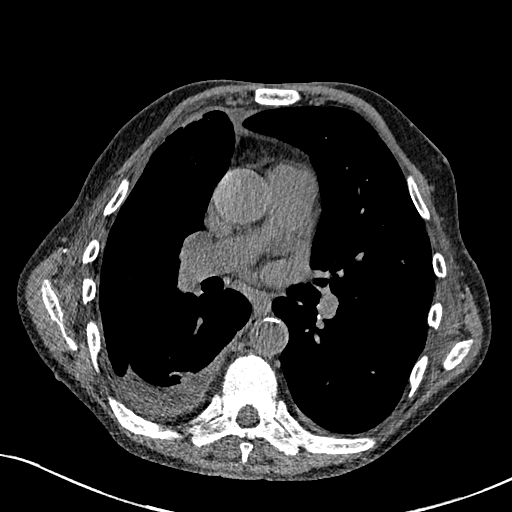
[im 99/179  lung]
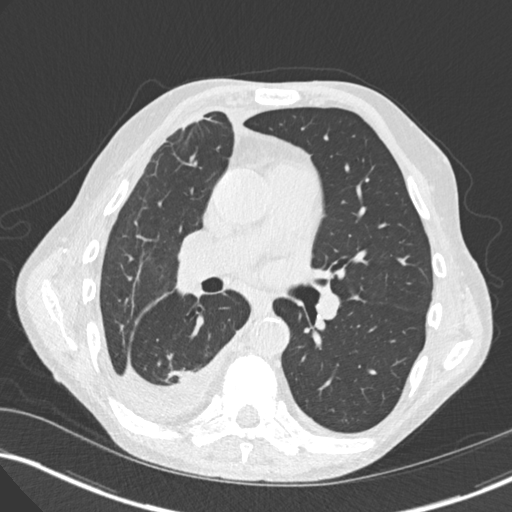
[im 107/179  lung]
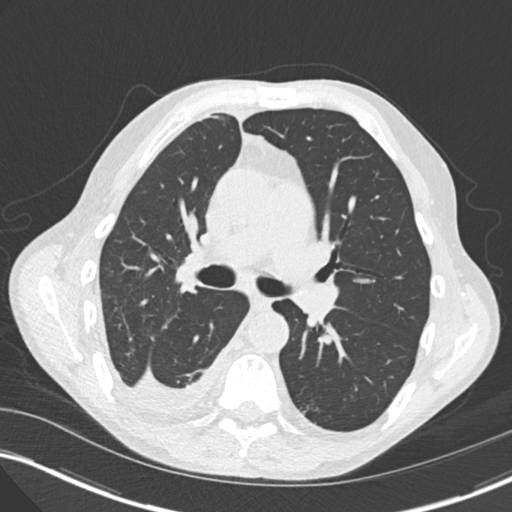
[im 119/179  lung]
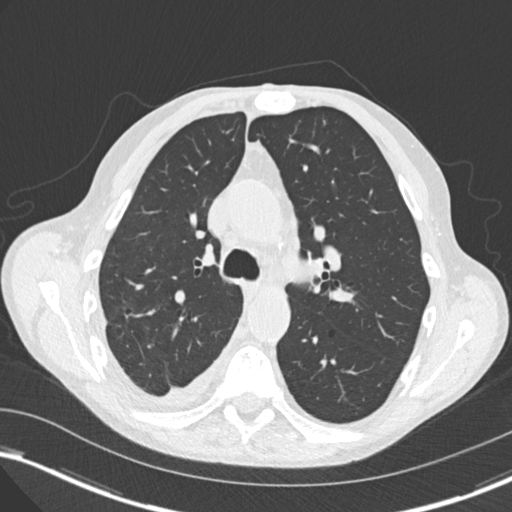
[im 132/179  lung]
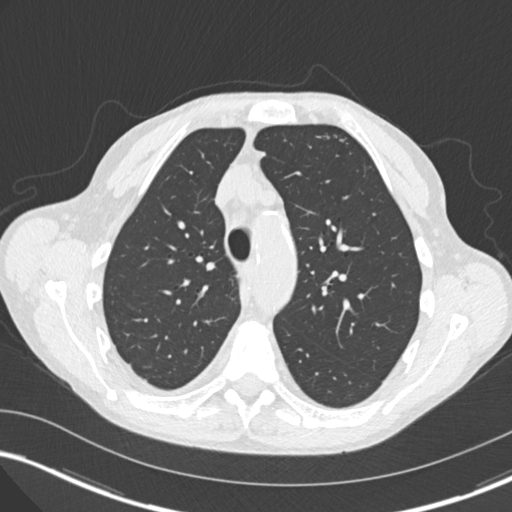
[im 143/179  mediastinal]
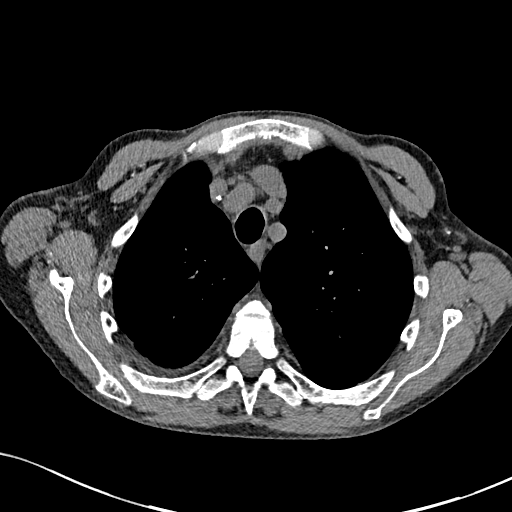
[im 143/179  lung]
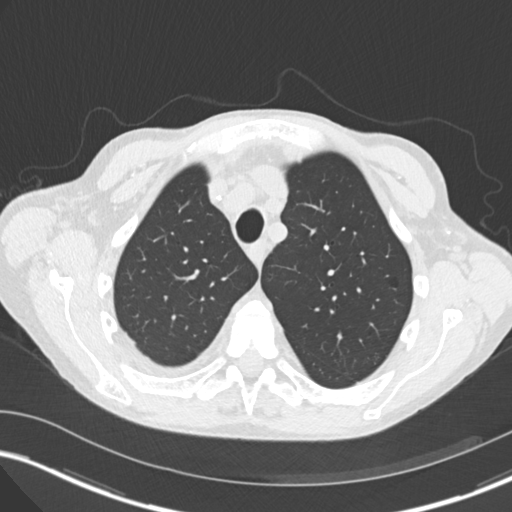
[im 152/179  lung]
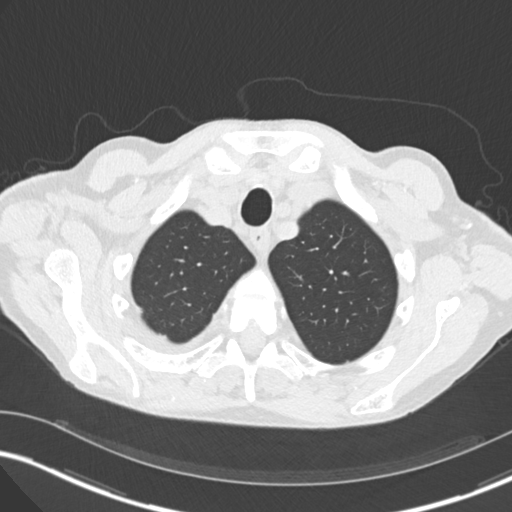
[im 165/179  lung]
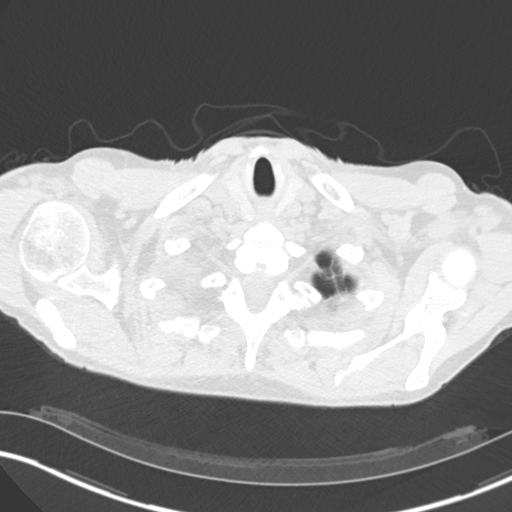

[15 of 34 positions shown; findings below may reference images not displayed]

FINDINGS: Cardiovascular: Atherosclerotic calcification of the arterial
vasculature, including three-vessel involvement of the coronary
arteries. Heart is enlarged. No pericardial effusion.

Mediastinum/Nodes: Mediastinal lymph nodes measure up to 12 mm in
the low right paratracheal station, nonspecific and likely reactive.
Hilar regions are difficult to evaluate without IV contrast. No
axillary adenopathy. Esophagus is grossly unremarkable.

Lungs/Pleura: Partially loculated moderate right pleural effusion
with rounded atelectasis in the right lower lobe. Additional
pleuroparenchymal scarring in the right upper and right middle
lobes. Scattered peribronchovascular nodularity in the left upper
and left lower lobes, likely postinfectious in etiology. Trace left
pleural fluid with subpleural scarring in the left lower lobe.
Airway is unremarkable.

Upper Abdomen: 12 mm low-attenuation lesion in the left hepatic lobe
is too small to characterize. Visualized portions of the liver and
adrenal glands are otherwise unremarkable. Kidneys are atrophic.
Low-attenuation lesions in the left kidney measure up to 10 mm, too
small to characterize. Visualized portions of the spleen, pancreas,
stomach and bowel are grossly unremarkable. No upper abdominal
adenopathy.

Musculoskeletal: No worrisome lytic or sclerotic lesions. Bones
appear dense, indicative of renal osteodystrophy.
IMPRESSION: 1. Partially loculated moderate right pleural effusion with rounded
atelectasis in the right lower lobe and additional subpleural
scarring in the right upper and right middle lobes.
2. Trace left pleural effusion with subpleural scarring in the left
lower lobe.
3. Aortic atherosclerosis ([HQ]-170.0). Three-vessel coronary
artery calcification.

## 2017-04-27 LAB — FUNGUS CULTURE WITH STAIN

## 2017-04-27 LAB — ACID FAST SMEAR (AFB): ACID FAST SMEAR - AFSCU2: NEGATIVE

## 2017-04-27 LAB — FUNGAL ORGANISM REFLEX

## 2017-04-27 LAB — FUNGUS CULTURE RESULT

## 2017-04-27 LAB — ACID FAST SMEAR (AFB, MYCOBACTERIA)

## 2017-06-16 LAB — ACID FAST CULTURE WITH REFLEXED SENSITIVITIES: ACID FAST CULTURE - AFSCU3: NEGATIVE

## 2017-09-19 ENCOUNTER — Other Ambulatory Visit
Admission: RE | Admit: 2017-09-19 | Discharge: 2017-09-19 | Disposition: A | Discharge status: Home / Self Care | Payer: Medicare Other | Source: Ambulatory Visit | Attending: Specialist | Admitting: Specialist

## 2017-09-19 DIAGNOSIS — J90 Pleural effusion, not elsewhere classified: Secondary | ICD-10-CM | POA: Diagnosis present

## 2017-09-19 LAB — BRAIN NATRIURETIC PEPTIDE: B Natriuretic Peptide: 798 pg/mL — ABNORMAL HIGH (ref 0.0–100.0)

## 2018-10-31 ENCOUNTER — Other Ambulatory Visit: Payer: Self-pay | Admitting: Specialist

## 2018-10-31 DIAGNOSIS — J849 Interstitial pulmonary disease, unspecified: Secondary | ICD-10-CM

## 2018-11-27 ENCOUNTER — Ambulatory Visit
Admission: RE | Admit: 2018-11-27 | Discharge: 2018-11-27 | Disposition: A | Discharge status: Home / Self Care | Payer: Medicare Other | Source: Ambulatory Visit | Attending: Specialist | Admitting: Specialist

## 2018-11-27 ENCOUNTER — Other Ambulatory Visit: Payer: Self-pay

## 2018-11-27 DIAGNOSIS — J849 Interstitial pulmonary disease, unspecified: Secondary | ICD-10-CM | POA: Insufficient documentation

## 2018-11-27 IMAGING — CT CT CHEST HIGH RESOLUTION WITHOUT CONTRAST
2 of 8 series · 13 of 36 positions shown, 16 images · non-contrast
Comparison: Chest CT [DATE].

CLINICAL DATA: 62-year-old male with pulmonary nodules in the
lungs.

EXAM:
CT CHEST WITHOUT CONTRAST
TECHNIQUE: Multidetector CT imaging of the chest was performed following the
standard protocol without intravenous contrast. High resolution
imaging of the lungs, as well as inspiratory and expiratory imaging,
was performed.

[Series 8: thorax · coronal · 0.60mm/px · 3 of 148 slices shown]
[im 30/148  lung]
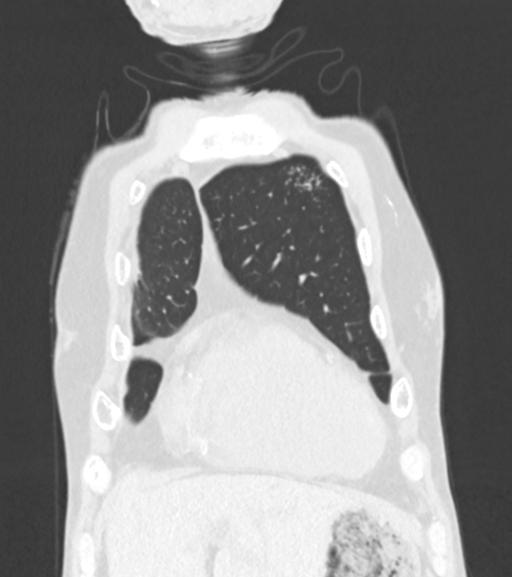
[im 59/148  lung]
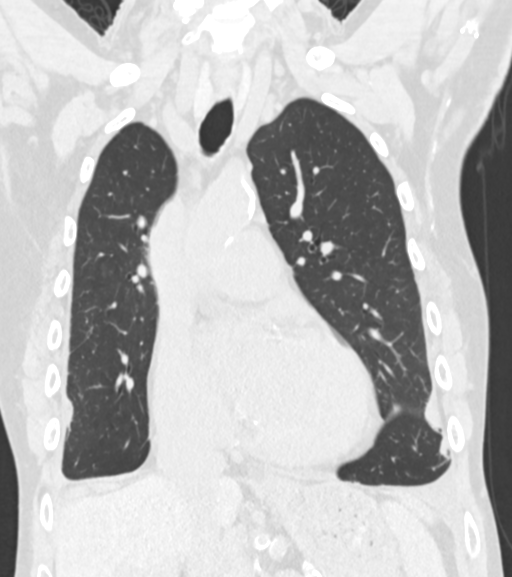
[im 89/148  lung]
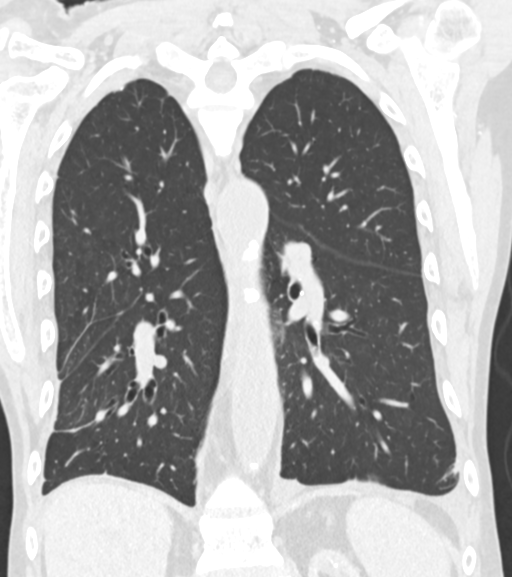

[Series 15: high res (id) thorax · axial · 0.55mm/px · z∈[-1290,-1005]mm · 10 of 343 slices shown, 13 images]
[im 29/343  mediastinal]
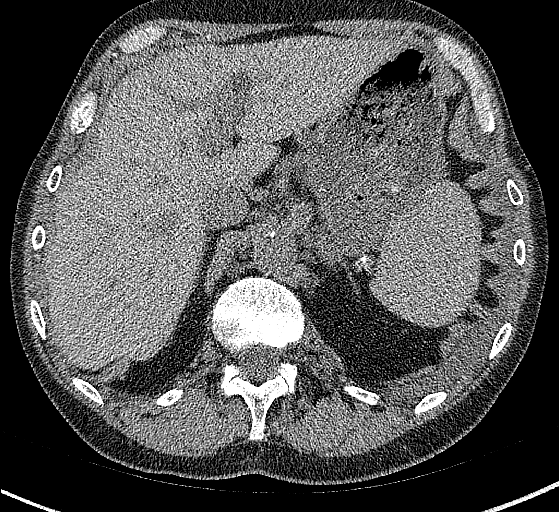
[im 29/343  lung]
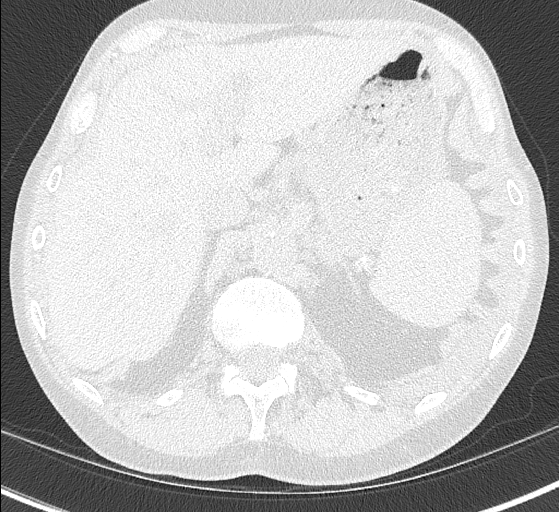
[im 58/343  lung]
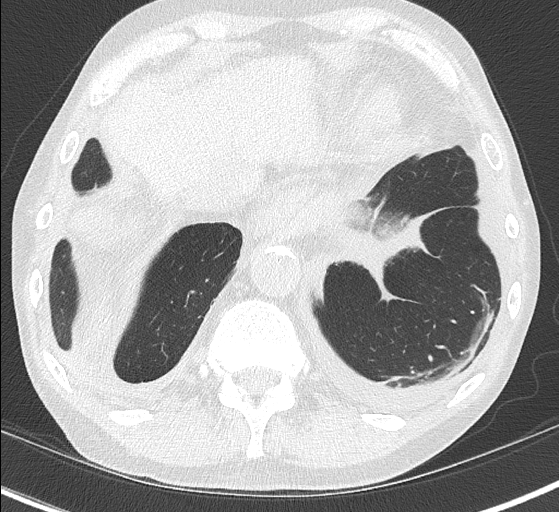
[im 86/343  lung]
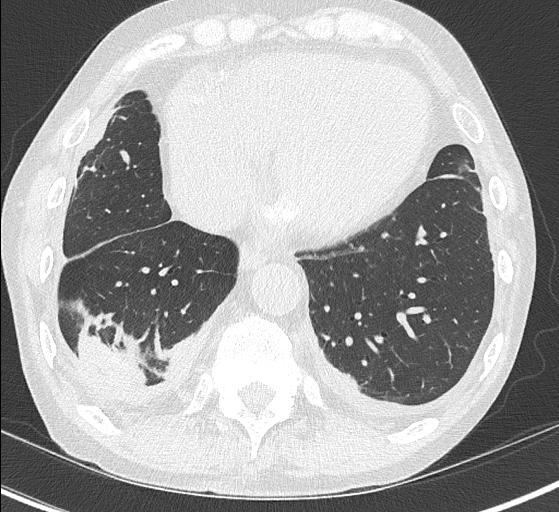
[im 115/343  lung]
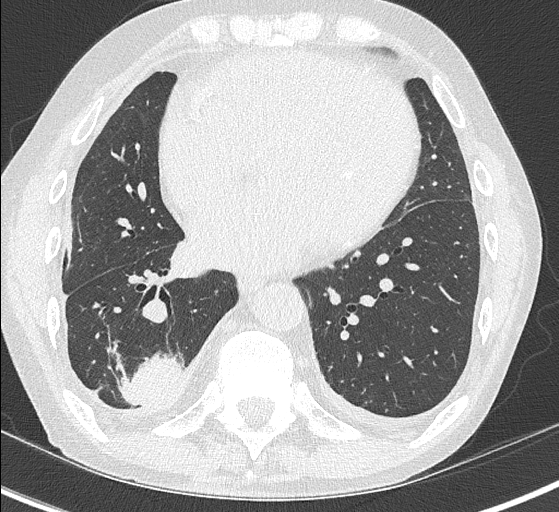
[im 143/343  mediastinal]
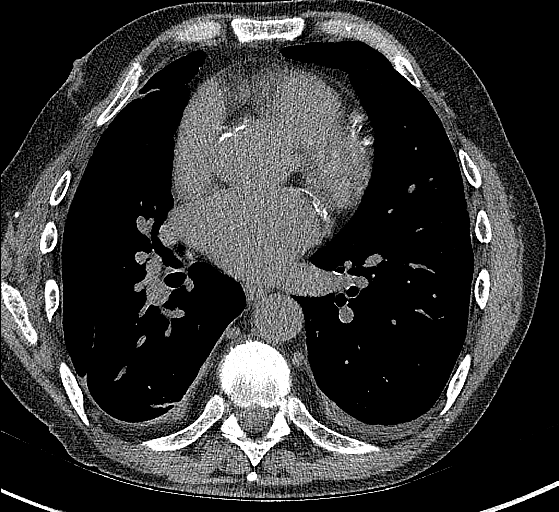
[im 143/343  lung]
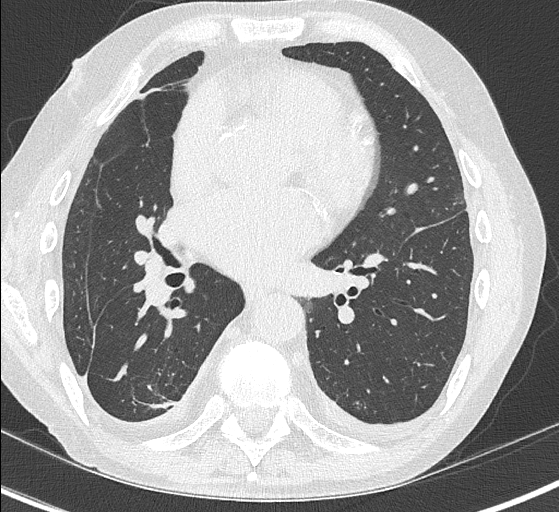
[im 200/343  lung]
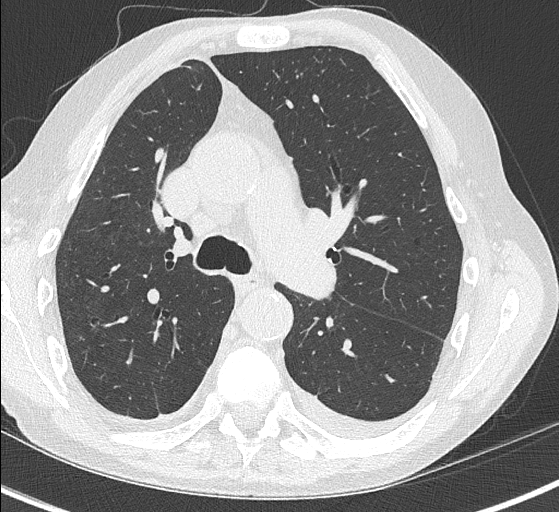
[im 229/343  lung]
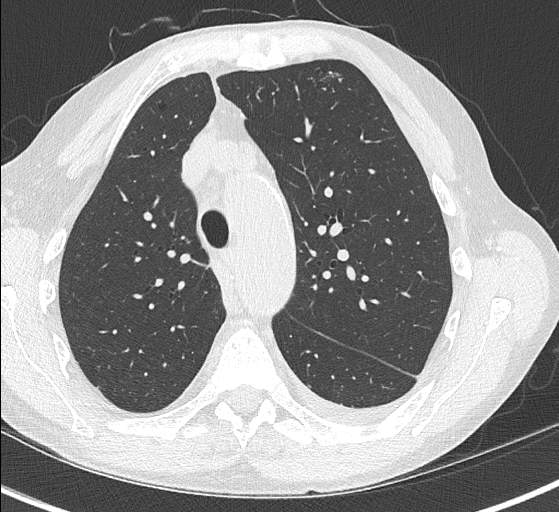
[im 257/343  lung]
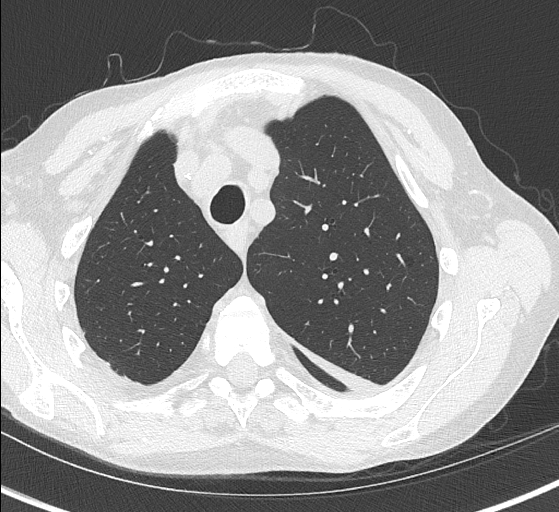
[im 286/343  mediastinal]
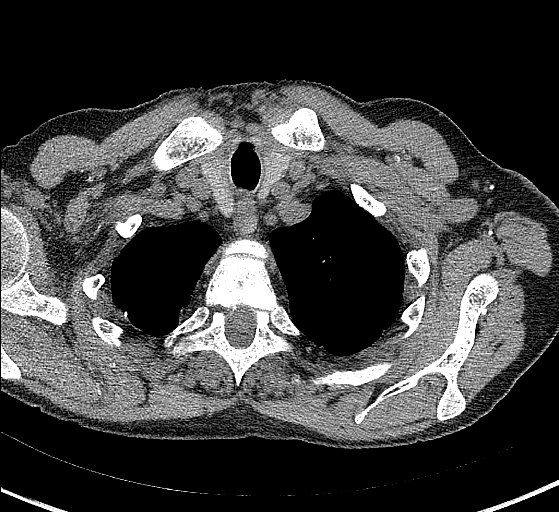
[im 286/343  lung]
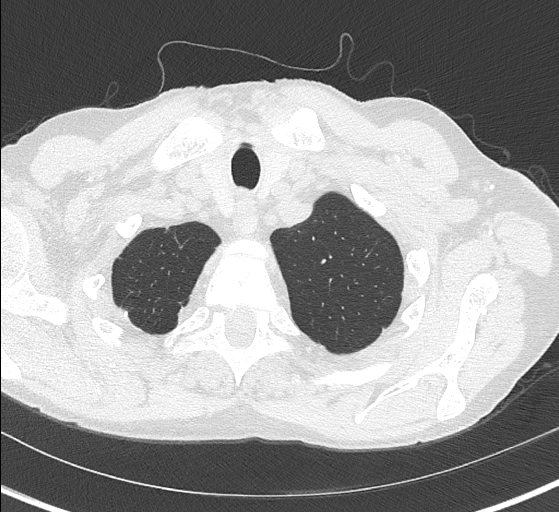
[im 314/343  lung]
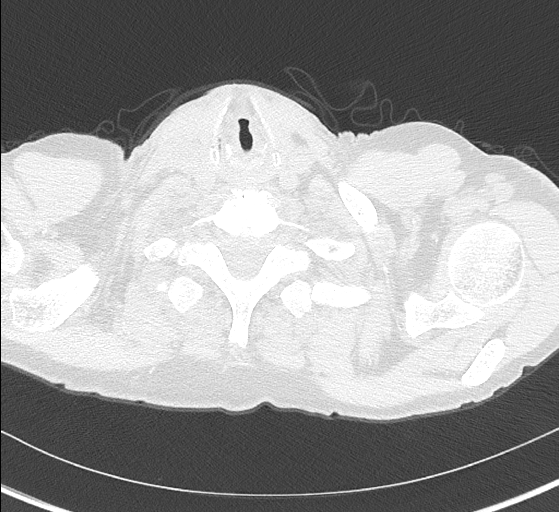

[13 of 36 positions shown; findings below may reference images not displayed]

FINDINGS: Cardiovascular: Heart size is mildly enlarged. There is no
significant pericardial fluid, thickening or pericardial
calcification. There is aortic atherosclerosis, as well as
atherosclerosis of the great vessels of the mediastinum and the
coronary arteries, including calcified atherosclerotic plaque in the
left main, left anterior descending, left circumflex and right
coronary arteries. Calcifications of the mitral annulus.

Mediastinum/Nodes: No pathologically enlarged mediastinal or hilar
lymph nodes. Please note that accurate exclusion of hilar adenopathy
is limited on noncontrast CT scans. Esophagus is unremarkable in
appearance. No axillary lymphadenopathy.

Lungs/Pleura: Trace right and small left pleural effusions lying
dependently. Small amount of calcified pleural plaque in the thorax
bilaterally, suggestive of asbestos related pleural disease. There
is again a mass-like area of ovoid architectural distortion
measuring 3.8 x 2.1 cm in the posterior aspect of the right lower
lobe with surrounding pleural tails, most compatible with rounded
atelectasis. Focal pleural thickening in the lateral aspect of the
left lower lobe and inferior segment of the lingula, also likely to
reflect an area of developing rounded atelectasis. Scattered areas
of peribronchovascular micro nodularity again noted throughout the
left upper and lower lobes, similar to prior studies, strongly
favored to be benign areas of mucoid impaction. Small calcified
granuloma in the left upper lobe. No other suspicious appearing
pulmonary nodules or masses are noted. No acute consolidative
airspace disease. High-resolution images demonstrate no significant
regions of ground-glass attenuation, septal thickening, subpleural
reticulation or traction bronchiectasis. Inspiratory and expiratory
imaging is unremarkable.

Upper Abdomen: Aortic atherosclerosis. Exophytic low-attenuation
lesion in the posterior aspect of the upper pole the left kidney
measuring 2.6 cm in diameter, compatible with a simple cyst.

Musculoskeletal: There are no aggressive appearing lytic or blastic
lesions noted in the visualized portions of the skeleton.
IMPRESSION: 1. Bilateral calcified pleural plaques suggesting asbestos related
pleural disease. In addition, there is evidence of areas of
developing rounded atelectasis in the lungs bilaterally, as above.
2. No definite imaging findings to suggest asbestosis or other
interstitial lung disease at this time.
3. Aortic atherosclerosis, in addition to left main and 3 vessel
coronary artery disease. Please note that although the presence of
coronary artery calcium documents the presence of coronary artery
disease, the severity of this disease and any potential stenosis
cannot be assessed on this non-gated CT examination. Assessment for
potential risk factor modification, dietary therapy or pharmacologic
therapy may be warranted, if clinically indicated.
4. Mild cardiomegaly.
5. There are calcifications of the mitral annulus. Echocardiographic
correlation for evaluation of potential valvular dysfunction may be
warranted if clinically indicated.

Aortic Atherosclerosis ([K9]-[K9]).

## 2019-04-09 ENCOUNTER — Observation Stay
Admission: EM | Admit: 2019-04-09 | Discharge: 2019-04-10 | Disposition: A | Discharge status: Home / Self Care | Payer: Medicare Other | Attending: Internal Medicine | Admitting: Internal Medicine

## 2019-04-09 ENCOUNTER — Emergency Department: Payer: Medicare Other

## 2019-04-09 ENCOUNTER — Other Ambulatory Visit: Payer: Self-pay

## 2019-04-09 ENCOUNTER — Inpatient Hospital Stay
Admit: 2019-04-09 | Discharge: 2019-04-09 | Disposition: A | Discharge status: Home / Self Care | Payer: Medicare Other | Attending: Family Medicine | Admitting: Family Medicine

## 2019-04-09 DIAGNOSIS — I160 Hypertensive urgency: Secondary | ICD-10-CM | POA: Insufficient documentation

## 2019-04-09 DIAGNOSIS — R9431 Abnormal electrocardiogram [ECG] [EKG]: Secondary | ICD-10-CM | POA: Diagnosis not present

## 2019-04-09 DIAGNOSIS — J90 Pleural effusion, not elsewhere classified: Secondary | ICD-10-CM | POA: Diagnosis not present

## 2019-04-09 DIAGNOSIS — Z7982 Long term (current) use of aspirin: Secondary | ICD-10-CM | POA: Diagnosis not present

## 2019-04-09 DIAGNOSIS — E8770 Fluid overload, unspecified: Secondary | ICD-10-CM | POA: Insufficient documentation

## 2019-04-09 DIAGNOSIS — G40909 Epilepsy, unspecified, not intractable, without status epilepticus: Secondary | ICD-10-CM | POA: Diagnosis not present

## 2019-04-09 DIAGNOSIS — I700 Atherosclerosis of aorta: Secondary | ICD-10-CM | POA: Insufficient documentation

## 2019-04-09 DIAGNOSIS — D631 Anemia in chronic kidney disease: Secondary | ICD-10-CM | POA: Insufficient documentation

## 2019-04-09 DIAGNOSIS — J810 Acute pulmonary edema: Secondary | ICD-10-CM | POA: Insufficient documentation

## 2019-04-09 DIAGNOSIS — K219 Gastro-esophageal reflux disease without esophagitis: Secondary | ICD-10-CM | POA: Insufficient documentation

## 2019-04-09 DIAGNOSIS — Z992 Dependence on renal dialysis: Secondary | ICD-10-CM | POA: Diagnosis not present

## 2019-04-09 DIAGNOSIS — Z79899 Other long term (current) drug therapy: Secondary | ICD-10-CM | POA: Insufficient documentation

## 2019-04-09 DIAGNOSIS — Z23 Encounter for immunization: Secondary | ICD-10-CM | POA: Insufficient documentation

## 2019-04-09 DIAGNOSIS — N2581 Secondary hyperparathyroidism of renal origin: Secondary | ICD-10-CM | POA: Insufficient documentation

## 2019-04-09 DIAGNOSIS — I132 Hypertensive heart and chronic kidney disease with heart failure and with stage 5 chronic kidney disease, or end stage renal disease: Secondary | ICD-10-CM | POA: Diagnosis not present

## 2019-04-09 DIAGNOSIS — J441 Chronic obstructive pulmonary disease with (acute) exacerbation: Principal | ICD-10-CM | POA: Diagnosis present

## 2019-04-09 DIAGNOSIS — N186 End stage renal disease: Secondary | ICD-10-CM | POA: Diagnosis not present

## 2019-04-09 DIAGNOSIS — R2681 Unsteadiness on feet: Secondary | ICD-10-CM | POA: Insufficient documentation

## 2019-04-09 DIAGNOSIS — Z20828 Contact with and (suspected) exposure to other viral communicable diseases: Secondary | ICD-10-CM | POA: Insufficient documentation

## 2019-04-09 DIAGNOSIS — R0989 Other specified symptoms and signs involving the circulatory and respiratory systems: Secondary | ICD-10-CM | POA: Insufficient documentation

## 2019-04-09 DIAGNOSIS — I5033 Acute on chronic diastolic (congestive) heart failure: Secondary | ICD-10-CM | POA: Insufficient documentation

## 2019-04-09 DIAGNOSIS — E782 Mixed hyperlipidemia: Secondary | ICD-10-CM | POA: Insufficient documentation

## 2019-04-09 DIAGNOSIS — J449 Chronic obstructive pulmonary disease, unspecified: Secondary | ICD-10-CM | POA: Diagnosis present

## 2019-04-09 DIAGNOSIS — I358 Other nonrheumatic aortic valve disorders: Secondary | ICD-10-CM | POA: Insufficient documentation

## 2019-04-09 DIAGNOSIS — R0902 Hypoxemia: Secondary | ICD-10-CM | POA: Diagnosis present

## 2019-04-09 LAB — CBC WITH DIFFERENTIAL/PLATELET
Abs Immature Granulocytes: 0.05 10*3/uL (ref 0.00–0.07)
Basophils Absolute: 0.1 10*3/uL (ref 0.0–0.1)
Basophils Relative: 0 %
Eosinophils Absolute: 0.1 10*3/uL (ref 0.0–0.5)
Eosinophils Relative: 1 %
HCT: 32.2 % — ABNORMAL LOW (ref 39.0–52.0)
Hemoglobin: 10.2 g/dL — ABNORMAL LOW (ref 13.0–17.0)
Immature Granulocytes: 0 %
Lymphocytes Relative: 6 %
Lymphs Abs: 0.8 10*3/uL (ref 0.7–4.0)
MCH: 31.6 pg (ref 26.0–34.0)
MCHC: 31.7 g/dL (ref 30.0–36.0)
MCV: 99.7 fL (ref 80.0–100.0)
Monocytes Absolute: 1.2 10*3/uL — ABNORMAL HIGH (ref 0.1–1.0)
Monocytes Relative: 8 %
Neutro Abs: 11.9 10*3/uL — ABNORMAL HIGH (ref 1.7–7.7)
Neutrophils Relative %: 85 %
Platelets: 213 10*3/uL (ref 150–400)
RBC: 3.23 MIL/uL — ABNORMAL LOW (ref 4.22–5.81)
RDW: 13.8 % (ref 11.5–15.5)
WBC: 14 10*3/uL — ABNORMAL HIGH (ref 4.0–10.5)
nRBC: 0 % (ref 0.0–0.2)

## 2019-04-09 LAB — COMPREHENSIVE METABOLIC PANEL
ALT: 9 U/L (ref 0–44)
AST: 10 U/L — ABNORMAL LOW (ref 15–41)
Albumin: 3.8 g/dL (ref 3.5–5.0)
Alkaline Phosphatase: 77 U/L (ref 38–126)
Anion gap: 11 (ref 5–15)
BUN: 41 mg/dL — ABNORMAL HIGH (ref 8–23)
CO2: 30 mmol/L (ref 22–32)
Calcium: 8.8 mg/dL — ABNORMAL LOW (ref 8.9–10.3)
Chloride: 100 mmol/L (ref 98–111)
Creatinine, Ser: 7.7 mg/dL — ABNORMAL HIGH (ref 0.61–1.24)
GFR calc Af Amer: 8 mL/min — ABNORMAL LOW (ref >60)
GFR calc non Af Amer: 7 mL/min — ABNORMAL LOW (ref >60)
Glucose, Bld: 106 mg/dL — ABNORMAL HIGH (ref 70–99)
Potassium: 4.4 mmol/L (ref 3.5–5.1)
Sodium: 141 mmol/L (ref 135–145)
Total Bilirubin: 0.6 mg/dL (ref 0.3–1.2)
Total Protein: 7.6 g/dL (ref 6.5–8.1)

## 2019-04-09 LAB — BASIC METABOLIC PANEL
Anion gap: 15 (ref 5–15)
BUN: 45 mg/dL — ABNORMAL HIGH (ref 8–23)
CO2: 27 mmol/L (ref 22–32)
Calcium: 8.5 mg/dL — ABNORMAL LOW (ref 8.9–10.3)
Chloride: 98 mmol/L (ref 98–111)
Creatinine, Ser: 8.16 mg/dL — ABNORMAL HIGH (ref 0.61–1.24)
GFR calc Af Amer: 7 mL/min — ABNORMAL LOW (ref >60)
GFR calc non Af Amer: 6 mL/min — ABNORMAL LOW (ref >60)
Glucose, Bld: 124 mg/dL — ABNORMAL HIGH (ref 70–99)
Potassium: 4.6 mmol/L (ref 3.5–5.1)
Sodium: 140 mmol/L (ref 135–145)

## 2019-04-09 LAB — TROPONIN I (HIGH SENSITIVITY)
Troponin I (High Sensitivity): 33 ng/L — ABNORMAL HIGH (ref <18)
Troponin I (High Sensitivity): 75 ng/L — ABNORMAL HIGH (ref <18)
Troponin I (High Sensitivity): 84 ng/L — ABNORMAL HIGH (ref <18)
Troponin I (High Sensitivity): 90 ng/L — ABNORMAL HIGH (ref <18)

## 2019-04-09 LAB — CBC
HCT: 30.3 % — ABNORMAL LOW (ref 39.0–52.0)
Hemoglobin: 9.8 g/dL — ABNORMAL LOW (ref 13.0–17.0)
MCH: 31.7 pg (ref 26.0–34.0)
MCHC: 32.3 g/dL (ref 30.0–36.0)
MCV: 98.1 fL (ref 80.0–100.0)
Platelets: 206 10*3/uL (ref 150–400)
RBC: 3.09 MIL/uL — ABNORMAL LOW (ref 4.22–5.81)
RDW: 13.9 % (ref 11.5–15.5)
WBC: 6.7 10*3/uL (ref 4.0–10.5)
nRBC: 0 % (ref 0.0–0.2)

## 2019-04-09 LAB — LACTIC ACID, PLASMA
Lactic Acid, Venous: 1.8 mmol/L (ref 0.5–1.9)
Lactic Acid, Venous: 0.9 mmol/L (ref 0.5–1.9)

## 2019-04-09 LAB — PHOSPHORUS: Phosphorus: 4.6 mg/dL (ref 2.5–4.6)

## 2019-04-09 LAB — MRSA PCR SCREENING: MRSA by PCR: NEGATIVE

## 2019-04-09 LAB — SARS CORONAVIRUS 2 BY RT PCR (HOSPITAL ORDER, PERFORMED IN ~~LOC~~ HOSPITAL LAB): SARS Coronavirus 2: NEGATIVE

## 2019-04-09 LAB — TSH: TSH: 1.156 u[IU]/mL (ref 0.350–4.500)

## 2019-04-09 IMAGING — DX PORTABLE CHEST - 1 VIEW
1 series · 1 of 1 positions shown · non-contrast
Comparison: Prior radiograph from [DATE].

CLINICAL DATA: Initial evaluation for acute shortness of breath.

EXAM:
PORTABLE CHEST 1 VIEW

[chest ap]
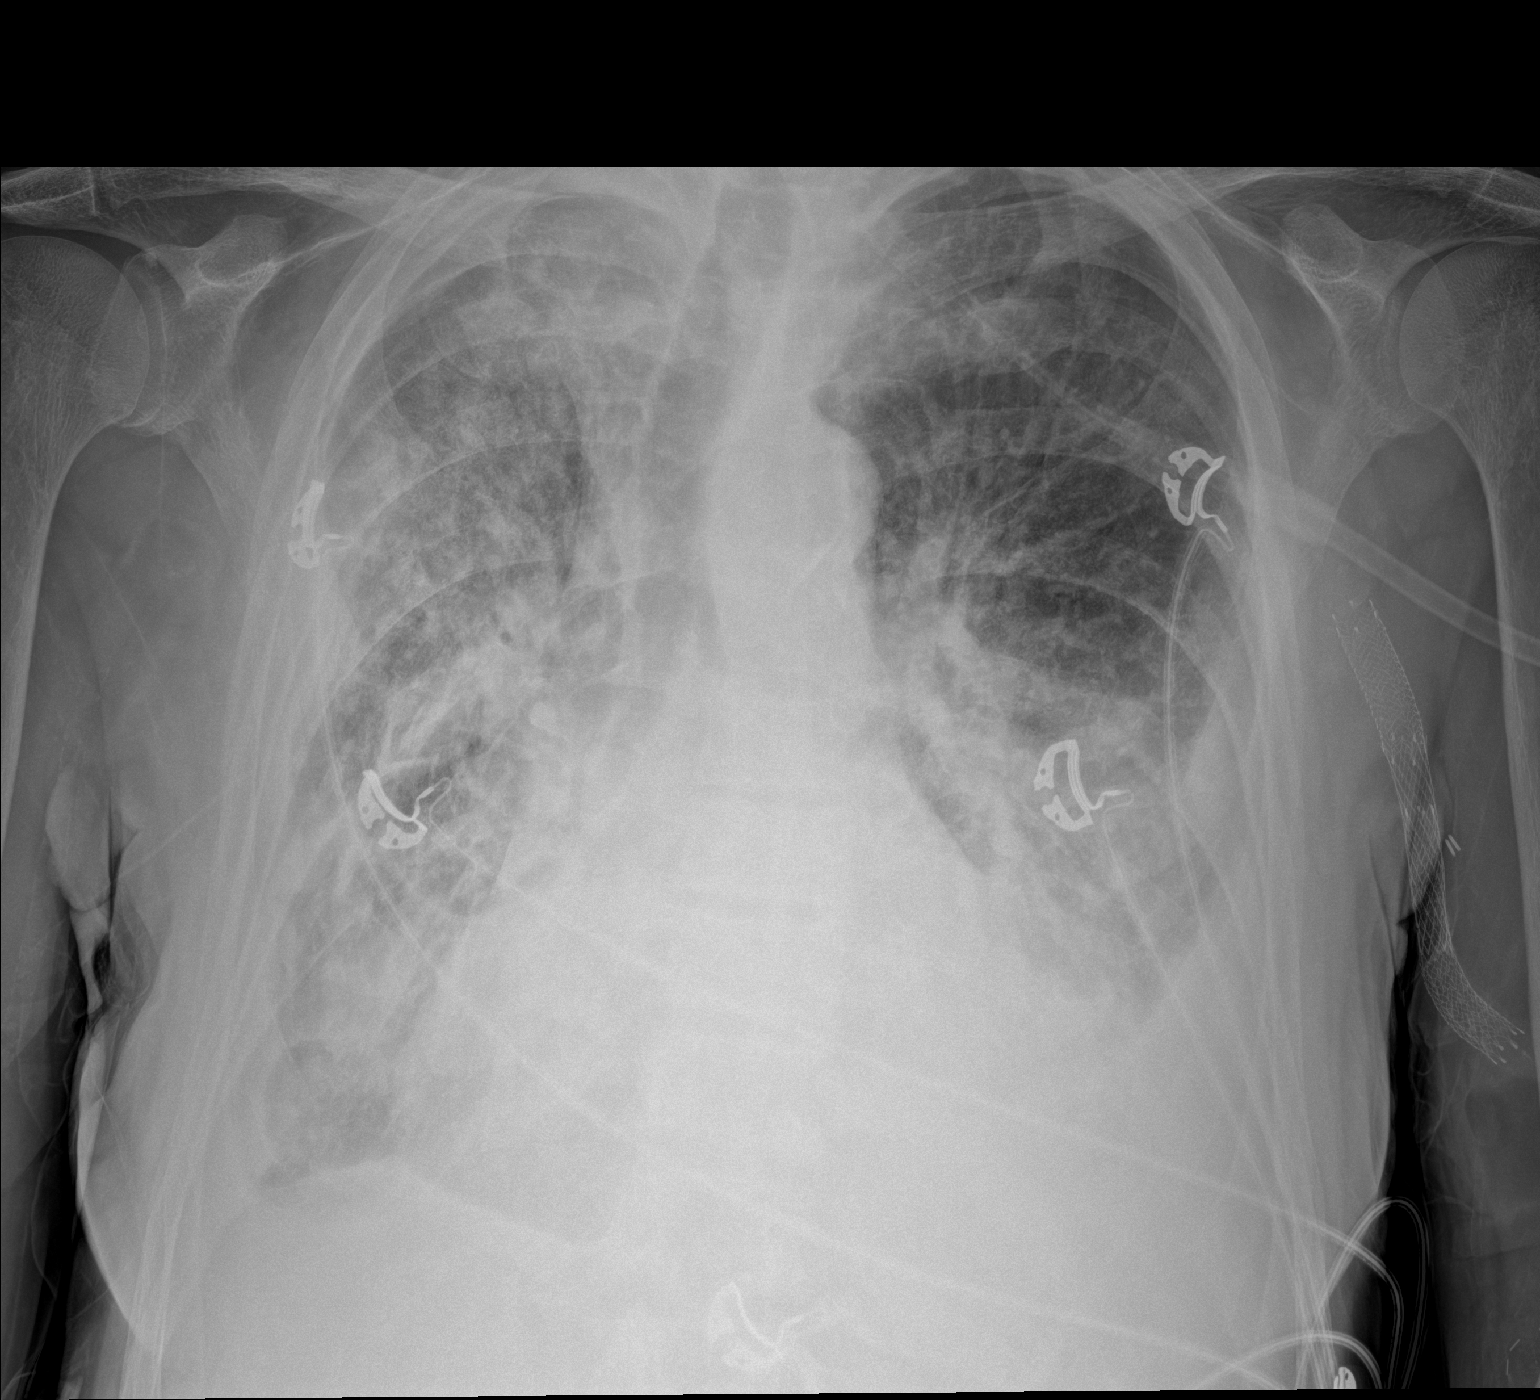

[1 of 1 positions shown; findings below may reference images not displayed]

FINDINGS: Cardiomegaly, stable. Mediastinal silhouette within normal limits.
Aortic atherosclerosis.

Mild chronic elevation of the right hemidiaphragm with associated
right basilar scarring. Diffuse pulmonary vascular congestion with
right greater than left airspace opacity, favored to reflect edema
and/or atelectasis, although infiltrates not excluded. Superimposed
small right with moderate left pleural effusions. No pneumothorax.

No acute osseous finding. Vascular stent overlies the left axilla.
IMPRESSION: 1. Cardiomegaly with diffuse pulmonary vascular congestion and right
greater than left airspace opacity. Findings favored to reflect
pulmonary edema with superimposed bibasilar atelectatic changes.
Possible infiltrates would be difficult to exclude, and could be
considered in the correct clinical setting.
2. Superimposed small right with moderate left pleural effusions.
3. Aortic atherosclerosis.

## 2019-04-09 MED ORDER — LIDOCAINE-PRILOCAINE 2.5-2.5 % EX CREA
1.0000 "application " | TOPICAL_CREAM | CUTANEOUS | Status: DC | PRN
  Filled 2019-04-09: qty 5

## 2019-04-09 MED ORDER — EPOETIN ALFA 10000 UNIT/ML IJ SOLN
4000.0000 [IU] | INTRAMUSCULAR | Status: DC
  Administered 2019-04-10: 4000 [IU] via INTRAVENOUS
  Filled 2019-04-09: qty 1

## 2019-04-09 MED ORDER — METOPROLOL TARTRATE 50 MG PO TABS
100.0000 mg | ORAL_TABLET | Freq: Two times a day (BID) | ORAL | Status: DC
  Administered 2019-04-09 – 2019-04-10 (×3): 100 mg via ORAL
  Filled 2019-04-09 (×3): qty 2

## 2019-04-09 MED ORDER — ONDANSETRON HCL 4 MG PO TABS: 8.0000 mg | ORAL_TABLET | Freq: Three times a day (TID) | ORAL | Status: DC | PRN

## 2019-04-09 MED ORDER — LEVETIRACETAM 500 MG PO TABS: 1000.0000 mg | ORAL_TABLET | Freq: Two times a day (BID) | ORAL | Status: DC

## 2019-04-09 MED ORDER — CLONIDINE HCL 0.1 MG PO TABS
0.1000 mg | ORAL_TABLET | Freq: Two times a day (BID) | ORAL | Status: DC
  Administered 2019-04-09: 0.1 mg via ORAL
  Filled 2019-04-09: qty 1

## 2019-04-09 MED ORDER — METHYLPREDNISOLONE SODIUM SUCC 125 MG IJ SOLR
60.0000 mg | Freq: Three times a day (TID) | INTRAMUSCULAR | Status: DC
  Administered 2019-04-09 – 2019-04-10 (×4): 60 mg via INTRAVENOUS
  Filled 2019-04-09 (×4): qty 2

## 2019-04-09 MED ORDER — RENA-VITE PO TABS
1.0000 | ORAL_TABLET | Freq: Every day | ORAL | Status: DC
  Administered 2019-04-09 – 2019-04-10 (×2): 1 via ORAL
  Filled 2019-04-09 (×2): qty 1

## 2019-04-09 MED ORDER — FUROSEMIDE 10 MG/ML IJ SOLN
60.0000 mg | Freq: Two times a day (BID) | INTRAMUSCULAR | Status: DC
  Administered 2019-04-09 – 2019-04-10 (×3): 60 mg via INTRAVENOUS
  Filled 2019-04-09 (×4): qty 6

## 2019-04-09 MED ORDER — IPRATROPIUM-ALBUTEROL 0.5-2.5 (3) MG/3ML IN SOLN
3.0000 mL | Freq: Once | RESPIRATORY_TRACT | Status: AC
  Administered 2019-04-09: 3 mL via RESPIRATORY_TRACT
  Filled 2019-04-09: qty 3

## 2019-04-09 MED ORDER — PANTOPRAZOLE SODIUM 40 MG PO TBEC
40.0000 mg | DELAYED_RELEASE_TABLET | Freq: Every day | ORAL | Status: DC
  Administered 2019-04-09 – 2019-04-10 (×2): 40 mg via ORAL
  Filled 2019-04-09 (×2): qty 1

## 2019-04-09 MED ORDER — LIDOCAINE HCL (PF) 1 % IJ SOLN
5.0000 mL | INTRAMUSCULAR | Status: DC | PRN
  Filled 2019-04-09: qty 5

## 2019-04-09 MED ORDER — PRAVASTATIN SODIUM 20 MG PO TABS
20.0000 mg | ORAL_TABLET | Freq: Every day | ORAL | Status: DC
  Administered 2019-04-09: 20 mg via ORAL
  Filled 2019-04-09 (×2): qty 1

## 2019-04-09 MED ORDER — MINOXIDIL 2.5 MG PO TABS
2.5000 mg | ORAL_TABLET | Freq: Two times a day (BID) | ORAL | Status: DC
  Administered 2019-04-09 – 2019-04-10 (×3): 2.5 mg via ORAL
  Filled 2019-04-09 (×4): qty 1

## 2019-04-09 MED ORDER — SODIUM CHLORIDE 0.9 % IV SOLN: 100.0000 mL | INTRAVENOUS | Status: DC | PRN

## 2019-04-09 MED ORDER — METHYLPREDNISOLONE SODIUM SUCC 125 MG IJ SOLR
125.0000 mg | Freq: Once | INTRAMUSCULAR | Status: AC
  Administered 2019-04-09: 125 mg via INTRAVENOUS
  Filled 2019-04-09: qty 2

## 2019-04-09 MED ORDER — PNEUMOCOCCAL VAC POLYVALENT 25 MCG/0.5ML IJ INJ
0.5000 mL | INJECTION | INTRAMUSCULAR | Status: AC
  Administered 2019-04-10: 0.5 mL via INTRAMUSCULAR
  Filled 2019-04-09: qty 0.5

## 2019-04-09 MED ORDER — TRAZODONE HCL 50 MG PO TABS: 25.0000 mg | ORAL_TABLET | Freq: Every evening | ORAL | Status: DC | PRN

## 2019-04-09 MED ORDER — HYDROCOD POLST-CPM POLST ER 10-8 MG/5ML PO SUER: 5.0000 mL | Freq: Two times a day (BID) | ORAL | Status: DC | PRN

## 2019-04-09 MED ORDER — GUAIFENESIN ER 600 MG PO TB12
600.0000 mg | ORAL_TABLET | Freq: Two times a day (BID) | ORAL | Status: DC
  Administered 2019-04-09 – 2019-04-10 (×3): 600 mg via ORAL
  Filled 2019-04-09 (×3): qty 1

## 2019-04-09 MED ORDER — ONDANSETRON HCL 4 MG/2ML IJ SOLN: 4.0000 mg | Freq: Four times a day (QID) | INTRAMUSCULAR | Status: DC | PRN

## 2019-04-09 MED ORDER — ENALAPRIL MALEATE 10 MG PO TABS: 20.0000 mg | ORAL_TABLET | Freq: Every day | ORAL | Status: DC

## 2019-04-09 MED ORDER — LEVETIRACETAM 500 MG PO TABS: 500.0000 mg | ORAL_TABLET | ORAL | Status: DC

## 2019-04-09 MED ORDER — SEVELAMER CARBONATE 800 MG PO TABS
800.0000 mg | ORAL_TABLET | Freq: Three times a day (TID) | ORAL | Status: DC
  Administered 2019-04-09 – 2019-04-10 (×2): 800 mg via ORAL
  Filled 2019-04-09 (×2): qty 1

## 2019-04-09 MED ORDER — NEPRO/CARBSTEADY PO LIQD
237.0000 mL | Freq: Two times a day (BID) | ORAL | Status: DC
  Administered 2019-04-09 – 2019-04-10 (×2): 237 mL via ORAL

## 2019-04-09 MED ORDER — SODIUM CHLORIDE 0.9% FLUSH: 3.0000 mL | INTRAVENOUS | Status: DC | PRN

## 2019-04-09 MED ORDER — HEPARIN SODIUM (PORCINE) 5000 UNIT/ML IJ SOLN
5000.0000 [IU] | Freq: Three times a day (TID) | INTRAMUSCULAR | Status: DC
  Administered 2019-04-09 – 2019-04-10 (×4): 5000 [IU] via SUBCUTANEOUS
  Filled 2019-04-09 (×4): qty 1

## 2019-04-09 MED ORDER — CLONIDINE HCL 0.1 MG PO TABS
0.3000 mg | ORAL_TABLET | Freq: Three times a day (TID) | ORAL | Status: DC
  Administered 2019-04-09 (×2): 0.3 mg via ORAL
  Filled 2019-04-09 (×3): qty 3

## 2019-04-09 MED ORDER — METOPROLOL TARTRATE 50 MG PO TABS: 50.0000 mg | ORAL_TABLET | Freq: Two times a day (BID) | ORAL | Status: DC

## 2019-04-09 MED ORDER — SODIUM CHLORIDE 0.9 % IV SOLN: 250.0000 mL | INTRAVENOUS | Status: DC | PRN

## 2019-04-09 MED ORDER — ACETAMINOPHEN 325 MG PO TABS: 650.0000 mg | ORAL_TABLET | Freq: Four times a day (QID) | ORAL | Status: DC | PRN

## 2019-04-09 MED ORDER — ACETAMINOPHEN 650 MG RE SUPP: 650.0000 mg | Freq: Four times a day (QID) | RECTAL | Status: DC | PRN

## 2019-04-09 MED ORDER — ORAL CARE MOUTH RINSE
15.0000 mL | Freq: Two times a day (BID) | OROMUCOSAL | Status: DC
  Administered 2019-04-09 (×2): 15 mL via OROMUCOSAL

## 2019-04-09 MED ORDER — HYDRALAZINE HCL 20 MG/ML IJ SOLN
10.0000 mg | Freq: Four times a day (QID) | INTRAMUSCULAR | Status: DC | PRN
  Filled 2019-04-09: qty 1

## 2019-04-09 MED ORDER — CHLORHEXIDINE GLUCONATE CLOTH 2 % EX PADS
6.0000 | MEDICATED_PAD | Freq: Every day | CUTANEOUS | Status: DC
  Administered 2019-04-09 – 2019-04-10 (×2): 6 via TOPICAL

## 2019-04-09 MED ORDER — ASPIRIN EC 81 MG PO TBEC
81.0000 mg | DELAYED_RELEASE_TABLET | Freq: Every day | ORAL | Status: DC
  Administered 2019-04-09 – 2019-04-10 (×2): 81 mg via ORAL
  Filled 2019-04-09 (×2): qty 1

## 2019-04-09 MED ORDER — ALTEPLASE 2 MG IJ SOLR: 2.0000 mg | Freq: Once | INTRAMUSCULAR | Status: DC | PRN

## 2019-04-09 MED ORDER — SODIUM CHLORIDE 0.9 % IV SOLN
1.0000 g | INTRAVENOUS | Status: DC
  Filled 2019-04-09: qty 10

## 2019-04-09 MED ORDER — MAGNESIUM HYDROXIDE 400 MG/5ML PO SUSP: 30.0000 mL | Freq: Every day | ORAL | Status: DC | PRN

## 2019-04-09 MED ORDER — IPRATROPIUM-ALBUTEROL 0.5-2.5 (3) MG/3ML IN SOLN
3.0000 mL | Freq: Four times a day (QID) | RESPIRATORY_TRACT | Status: DC
  Administered 2019-04-09 – 2019-04-10 (×3): 3 mL via RESPIRATORY_TRACT
  Filled 2019-04-09 (×5): qty 3

## 2019-04-09 MED ORDER — NITROGLYCERIN 2 % TD OINT
1.0000 [in_us] | TOPICAL_OINTMENT | Freq: Once | TRANSDERMAL | Status: AC
  Administered 2019-04-09: 1 [in_us] via TOPICAL
  Filled 2019-04-09: qty 1

## 2019-04-09 MED ORDER — ONDANSETRON HCL 4 MG PO TABS: 4.0000 mg | ORAL_TABLET | Freq: Four times a day (QID) | ORAL | Status: DC | PRN

## 2019-04-09 MED ORDER — PENTAFLUOROPROP-TETRAFLUOROETH EX AERO
1.0000 "application " | INHALATION_SPRAY | CUTANEOUS | Status: DC | PRN
  Filled 2019-04-09: qty 30

## 2019-04-09 MED ORDER — SODIUM CHLORIDE 0.9 % IV SOLN
500.0000 mg | INTRAVENOUS | Status: DC
  Filled 2019-04-09: qty 500

## 2019-04-09 MED ORDER — SODIUM CHLORIDE 0.9% FLUSH
3.0000 mL | Freq: Two times a day (BID) | INTRAVENOUS | Status: DC
  Administered 2019-04-09 – 2019-04-10 (×3): 3 mL via INTRAVENOUS

## 2019-04-09 MED ORDER — HEPARIN SODIUM (PORCINE) 1000 UNIT/ML DIALYSIS
1000.0000 [IU] | INTRAMUSCULAR | Status: DC | PRN
  Filled 2019-04-09: qty 1

## 2019-04-09 MED ORDER — CINACALCET HCL 30 MG PO TABS
60.0000 mg | ORAL_TABLET | Freq: Every day | ORAL | Status: DC
  Administered 2019-04-09 – 2019-04-10 (×2): 60 mg via ORAL
  Filled 2019-04-09 (×2): qty 2

## 2019-04-09 NOTE — H&P (Addendum)
[facility] [place] NAME: Scott Castro    MR#:  [id number]  DATE OF BIRTH:  1956-01-03  DATE OF ADMISSION:  04/09/2019  PRIMARY CARE PHYSICIAN: Franciso Bend, MD   REQUESTING/REFERRING PHYSICIAN: Lurline Hare, MD  CHIEF COMPLAINT:   Chief Complaint  Patient presents with  . Shortness of Breath  . Respiratory Distress    HISTORY OF PRESENT ILLNESS:  Scott Castro  is a 63 y.o. Caucasian male with a known history of end-stage renal disease on hemodialysis, COPD, hypertension and Barrett's esophagus, who presented to the emergency room with acute onset of worsening dyspnea as well as cough, productive of brownish sputum and wheezing for the last couple of days, which have been waking him up from sleep including last night.  He has mild postnasal drip.  He did not miss any hemodialysis sessions.  He denies any chest pain or palpitations.  He admitted to dyspnea on exertion and mild lower extremity edema as well as orthopnea and paroxysmal nocturnal dyspnea.  No fever or chills.  No recent sick exposures to anybody with COVID-19.  Upon presentation to the emergency room, blood pressure was elevated 186/84 and pulse continues 89% on her percent nonrebreather with respiratory of 21.  Pulse ox entry is later improved to 97%.  His labs revealed a BUN of 41 and creatinine of 7.7 with a high-sensitivity troponin I of 33, and lactic acid 1.8 with WBC of 14 hemoglobin of 10.2 hematocrit 32.2.  His COVID-19 test came back negative.  He had blood cultures sent.  He had a portable chest x-ray that showed cardiomegaly with diffuse pulmonary vascular congestion and right more than left airspace opacity, findings favoring pulmonary edema and superimposed bibasal atelectatic changes.  Possible infiltrate could be difficult to exclude.  It also showed small superimposed right and moderate left pleural effusions and aortic atherosclerosis.  EKGs was incomplete.  The patient  was given IV Rocephin IV Solu-Medrol as well as duo nebs.  He will be admitted to a telemetry bed for further evaluation and management.   PAST MEDICAL HISTORY:   Past Medical History:  Diagnosis Date  . Barrett's esophagus   . Chronic kidney disease    CHRONIC\  . Glomerulosclerosis, focal   . Hyperparathyroidism due to renal insufficiency (HCC)   Hypertension, COPD, end-stage renal disease on dialysis on Tuesday Thursday and Saturday History of multiple lung nodules. History of focal segmental glomerulosclerosis PAST SURGICAL HISTORY:   Past Surgical History:  Procedure Laterality Date  . AV FISTULA PLACEMENT    . COLONOSCOPY N/A 03/22/2016   Procedure: COLONOSCOPY;  Surgeon: Manya Silvas, MD;  Location[facility] ENDOSCOPY;  Service: Endoscopy;  Laterality: N/A;  . DG AV DIALYSIS GRAFT DECLOT OR    . ESOPHAGOGASTRODUODENOSCOPY (EGD) WITH PROPOFOL  03/22/2016   Procedure: ESOPHAGOGASTRODUODENOSCOPY (EGD) WITH PROPOFOL;  Surgeon: Manya Silvas, MD;  Location: [facility] ENDOSCOPY;  Service: Endoscopy;;  . FLEXIBLE BRONCHOSCOPY    . KIDNEY TRANSPLANT Right 1985  . REMOVAL TENCKHOFF CATH      SOCIAL HISTORY:   Social History   Tobacco Use  . Smoking status: Never Smoker  . Smokeless tobacco: Never Used  Substance Use Topics  . Alcohol use: No    FAMILY HISTORY:  History reviewed. No pertinent family history.  DRUG ALLERGIES:  No Known Allergies  REVIEW OF SYSTEMS:   ROS As per history of present illness. All pertinent systems were reviewed above. Constitutional,  HEENT,  cardiovascular, respiratory, GI, GU, musculoskeletal, neuro, psychiatric, endocrine,  integumentary and hematologic systems were reviewed and are otherwise  negative/unremarkable except for positive findings mentioned above in the HPI.   MEDICATIONS AT HOME:   Prior to Admission medications   Medication Sig Start Date End Date Taking? Authorizing Provider  aspirin 81 MG tablet Take 81 mg by mouth  daily.    [provider]  cinacalcet (SENSIPAR) 30 MG tablet Take 60 mg by mouth daily.    [provider]  cloNIDine (CATAPRES) 0.1 MG tablet Take 0.1 mg by mouth 2 (two) times daily.    [provider]  cloNIDine (CATAPRES) 0.3 MG tablet Take 0.3 mg by mouth 3 (three) times daily.    [provider]  enalapril (VASOTEC) 20 MG tablet Take 20 mg by mouth daily.    [provider]  esomeprazole (NEXIUM) 40 MG capsule Take 40 mg by mouth daily at 12 noon.    [provider]  levETIRAcetam (KEPPRA) 1000 MG tablet Take 1,000 mg by mouth 2 (two) times daily.    [provider]  levETIRAcetam (KEPPRA) 500 MG tablet Take 500 mg by mouth 3 (three) times a week.    [provider]  metoprolol (LOPRESSOR) 100 MG tablet Take 100 mg by mouth 2 (two) times daily.    [provider]  metoprolol (LOPRESSOR) 50 MG tablet Take 50 mg by mouth 2 (two) times daily.    [provider]  minoxidil (LONITEN) 2.5 MG tablet Take by mouth 2 (two) times daily.    [provider]  multivitamin (RENA-VIT) TABS tablet Take 1 tablet by mouth daily.    [provider]  ondansetron (ZOFRAN) 8 MG tablet Take 8 mg by mouth every 8 (eight) hours as needed for nausea or vomiting.    [provider]  pravastatin (PRAVACHOL) 20 MG tablet Take 20 mg by mouth daily.    [provider]  sevelamer carbonate (RENVELA) 800 MG tablet Take 800 mg by mouth 3 (three) times daily with meals.    [provider]      VITAL SIGNS:  Blood pressure (!) 185/82, pulse 71, temperature 97.9 F (36.6 C), temperature source Oral, resp. rate (!) 27, height 5\' 9"  (1.753 m), weight 70 kg, SpO2 97 %.  PHYSICAL EXAMINATION:  Physical Exam  GENERAL:  63 y.o.-year-old Caucasian male patient lying in the bed with no acute distress.  EYES: Pupils equal, round, reactive to light and accommodation. No scleral icterus.  Extraocular muscles intact.  HEENT: Head atraumatic, normocephalic. Oropharynx and nasopharynx clear.  NECK:  Supple, no jugular venous distention. No thyroid enlargement, no tenderness.  LUNGS: Diffuse expiratory wheezes with tight expiratory airflow and harsh vesicular breathing with bibasal rales. CARDIOVASCULAR: Regular rate and rhythm, S1, S2 normal. No murmurs, rubs, or gallops.  ABDOMEN: Soft, nondistended, nontender. Bowel sounds present. No organomegaly or mass.  EXTREMITIES: Trace bilateral lower extremity pitting edema, with no cyanosis, or clubbing.  NEUROLOGIC: Cranial nerves II through XII are intact. Muscle strength 5/5 in all extremities. Sensation intact. Gait not checked.  PSYCHIATRIC: The patient is alert and oriented x 3.  Normal affect and good eye contact. SKIN: No obvious rash, lesion, or ulcer.   LABORATORY PANEL:   CBC Recent Labs  Lab 04/09/19 0155  WBC 14.0*  HGB 10.2*  HCT 32.2*  PLT 213   ------------------------------------------------------------------------------------------------------------------  Chemistries  Recent Labs  Lab 04/09/19 0155  NA 141  K 4.4  CL 100  CO2 30  GLUCOSE 106*  BUN 41*  CREATININE 7.70*  CALCIUM 8.8*  AST 10*  ALT 9  ALKPHOS 77  BILITOT 0.6   ------------------------------------------------------------------------------------------------------------------  Cardiac Enzymes No results for input(s): TROPONINI in the last 168 hours. ------------------------------------------------------------------------------------------------------------------  RADIOLOGY:  Dg Chest Port 1 View  Result Date: 04/09/2019 CLINICAL DATA:  Initial evaluation for acute shortness of breath. EXAM: PORTABLE CHEST 1 VIEW COMPARISON:  Prior radiograph from 03/11/2017. FINDINGS: Cardiomegaly, stable. Mediastinal silhouette within normal limits. Aortic atherosclerosis. Mild chronic elevation of the right hemidiaphragm with associated right  basilar scarring. Diffuse pulmonary vascular congestion with right greater than left airspace opacity, favored to reflect edema and/or atelectasis, although infiltrates not excluded. Superimposed small right with moderate left pleural effusions. No pneumothorax. No acute osseous finding. Vascular stent overlies the left axilla. IMPRESSION: 1. Cardiomegaly with diffuse pulmonary vascular congestion and right greater than left airspace opacity. Findings favored to reflect pulmonary edema with superimposed bibasilar atelectatic changes. Possible infiltrates would be difficult to exclude, and could be considered in the correct clinical setting. 2. Superimposed small right with moderate left pleural effusions. 3. Aortic atherosclerosis. Electronically Signed   By: Jeannine Boga M.D.   On: 04/09/2019 02:08      IMPRESSION AND PLAN:   1.  Acute CHF(new onset, likely diastolic)/fluid overload with end-stage renal disease on hemodialysis.   The patient will be admitted to a telemetry bed and will be diuresed with IV Lasix.  Will follow serial cardiac enzymes.  Will obtain a 2D echo(if not done last 6 months) and a cardiology consultation in a.m. Dr. Nehemiah Massed was notified about the patient.  2.  COPD exacerbation with subsequent acute hypoxic respiratory failure that is also due to #1.The patient will be placed on IV steroid therapy with IV Solu-Medrol as well as nebulized bronchodilator therapy with duonebs q.i.d. and q.4 hours p.r.n., mucolytic therapy with Mucinex and antibiotic therapy with IV Rocephin and Zithromax.  Sputum Gram stain culture and sensitivity will be obtained.  O2 protocol will be followed.   3.  End-stage renal disease on hemodialysis.  Nephrology consultation will be obtained for follow-up.  Dr. Theador Hawthorne was notified.  Renvela will be continued.  4.  Hypertensive urgency.  Will continue amlodipine, Lopressor, Vasotec, minoxidil and clonidine.  The patient will be placed on PRN IV  hydralazine.  5.  GERD.  PPI therapy will be resumed.  6.  History of seizure disorder.  The patient has not had any seizures since 2015.  7.  DVT prophylaxis.  Subcutaneous heparin.  All the records are reviewed and case discussed with ED provider. The plan of care was discussed in details with the patient (and family). I answered all questions. The patient agreed to proceed with the above mentioned plan. Further management will depend upon hospital course.   CODE STATUS: Full code  TOTAL TIME TAKING CARE OF THIS PATIENT: 55 minutes.    Christel Mormon M.D on 04/09/2019 at 3:53 AM  Pager - [phone]  After 6pm go to [email or web address] - Proofreader  [facility]  Office  [phone]  CC: Primary care physician; Franciso Bend, MD   Note: This dictation was prepared with Dragon dictation along with smaller phrase technology. Any transcriptional errors that result from this process are unintentional.

## 2019-04-09 NOTE — Progress Notes (Signed)
OT Cancellation Note  Patient Details Name: Scott Castro MRN: [id number] DOB: Nov 30, 1955   Cancelled Treatment:    Reason Eval/Treat Not Completed: Patient at procedure or test/ unavailable. Thank you for the OT consult. Order received and chart revived. Upon arrival to floor, spoke with primary RN who stated pt is currently off the unit for dialysis. Will follow acutely and re-attempt at a later time/date as available and pt medically appropriate for OT evaluation.   Shara Blazing, M.S., OTR/L Ascom: [phone] 04/09/19, 1:07 PM

## 2019-04-09 NOTE — Progress Notes (Signed)
Chart reviewed.  Patient here with fluid overload.  I left voicemail for Dr. Zollie Scale for dialysis.  Plan for dialysis to assist with fluid overload.  Follow-up on echo.  Agree with admitting MD plan.

## 2019-04-09 NOTE — Progress Notes (Signed)
Initial Nutrition Assessment  DOCUMENTATION CODES:   Not applicable  INTERVENTION:   Nepro Shake po BID, each supplement provides 425 kcal and 19 grams protein  Magic cup TID with meals, each supplement provides 290 kcal and 9 grams of protein  Rena-vite daily   NUTRITION DIAGNOSIS:   Increased nutrient needs related to chronic illness(ESRD on HD, COPD) as evidenced by increased estimated needs.  GOAL:   Patient will meet greater than or equal to 90% of their needs  MONITOR:   PO intake, Supplement acceptance, Labs, Weight trends, Skin, I & O's  REASON FOR ASSESSMENT:   Consult Assessment of nutrition requirement/status  ASSESSMENT:   63 year old male with ESRD, interstitial lung disease and COPD, Barrett's esophagus admitted with new CHF and volume overload   Unable to see patient today as pt in HD at time of RD visit. Per chart review, pt eating 100% of meals in hospital. RD will add supplements and rena-vite to help pt meet his estimated needs and replace losses from HD. Per chart, pt appears fairly weight stable pta. RD will obtain nutrition related history and exam at follow up.   Pt at high risk for malnutrition but unable to diagnose at this time as NFPE cannot be performed.   Medications reviewed and include: aspirin, cinacalcet, lasix, heparin, protonix, rena-vite, renvela  Labs reviewed: BUN 45(H), creat 8.16(H), P 4.6 wnl Hgb 9.8(L), Hct 30.3(L)  Unable to complete Nutrition-Focused physical exam at this time.   Diet Order:   Diet Order            Diet 2 gram sodium Room service appropriate? Yes; Fluid consistency: Thin  Diet effective now            EDUCATION NEEDS:   Not appropriate for education at this time  Skin:  Skin Assessment: Reviewed RN Assessment  Last BM:  pta  Height:   Ht Readings from Last 1 Encounters:  04/09/19 5\' 9"  (1.753 m)    Weight:   Wt Readings from Last 1 Encounters:  04/09/19 71.5 kg    Ideal Body Weight:   72.7 kg  BMI:  Body mass index is 23.28 kg/m.  Estimated Nutritional Needs:   Kcal:  2000-2300kcal/day  Protein:  100-115g/day  Fluid:  UOP +1L  Koleen Distance MS, RD, LDN Pager #- [phone] Office#- [phone] After Hours Pager: [phone]

## 2019-04-09 NOTE — Progress Notes (Signed)
*  PRELIMINARY RESULTS* Echocardiogram 2D Echocardiogram has been performed.  Worth 04/09/2019, 8:12 PM

## 2019-04-09 NOTE — Progress Notes (Signed)
Patient refused uf of 3.5kg.  Settled for uf of 1kg. Dr Holley Raring notified

## 2019-04-09 NOTE — ED Triage Notes (Signed)
Pt arrived via [facility] from home with SOB for x2 days. Pt is a dialysis pt and last treatment was Sat. Pt states that he has to stop to catch his breath which is not normal for him. Pt has a hx of COPD and HTN.

## 2019-04-09 NOTE — ED Provider Notes (Signed)
[facility] Emergency Department Provider Note   ____________________________________________   First MD Initiated Contact with Patient 04/09/19 0130     (approximate)  I have reviewed the triage vital signs and the nursing notes.   HISTORY  Chief Complaint Respiratory distress   HPI Scott Castro is a 63 y.o. male brought to the ED via EMS from home with a chief complaint of shortness of breath.  Patient has a history of ESRD on dialysis Tuesday/Thursday/Saturday.  He did receive dialysis on Saturday.  Reports shortness of breath x2 days.  Endorses dyspnea on exertion.  Sees pulmonology for interstitial pulmonary disease as well as COPD.  He is not on home oxygen.  Endorses dry cough and chest tightness with shortness of breath.  Denies fever, abdominal pain, nausea, vomiting, diarrhea.  Denies recent travel, trauma or exposure to persons diagnosed with coronavirus.       Past Medical History:  Diagnosis Date   Barrett's esophagus    Chronic kidney disease    CHRONIC\   Glomerulosclerosis, focal    Hyperparathyroidism due to renal insufficiency [facility])     Patient Active Problem List   Diagnosis Date Noted   COPD exacerbation ([place]) 04/09/2019    Past Surgical History:  Procedure Laterality Date   AV FISTULA PLACEMENT     COLONOSCOPY N/A 03/22/2016   Procedure: COLONOSCOPY;  Surgeon: Manya Silvas, MD;  Location: [place];  Service: Endoscopy;  Laterality: N/A;   DG AV DIALYSIS GRAFT DECLOT OR     ESOPHAGOGASTRODUODENOSCOPY (EGD) WITH PROPOFOL  03/22/2016   Procedure: ESOPHAGOGASTRODUODENOSCOPY (EGD) WITH PROPOFOL;  Surgeon: Manya Silvas, MD;  Location: [facility] ENDOSCOPY;  Service: Endoscopy;;   [place] CATH      Prior to Admission medications   Medication Sig Start Date End Date Taking? Authorizing Provider  aspirin 81 MG tablet Take 81 mg by mouth daily.    Yes [provider]  cinacalcet (SENSIPAR) 30 MG tablet Take 60 mg by mouth daily.   Yes [provider]  enalapril (VASOTEC) 20 MG tablet Take 20 mg by mouth daily.   Yes [provider]  esomeprazole (NEXIUM) 40 MG capsule Take 40 mg by mouth daily at 12 noon.   Yes [provider]  fexofenadine (ALLEGRA) 180 MG tablet Take 180 mg by mouth daily.   Yes [provider]  metoprolol (LOPRESSOR) 100 MG tablet Take 100 mg by mouth 2 (two) times daily.   Yes [provider]  minoxidil (LONITEN) 2.5 MG tablet Take by mouth 2 (two) times daily.   Yes [provider]  multivitamin (RENA-VIT) TABS tablet Take 1 tablet by mouth daily.   Yes [provider]  ondansetron (ZOFRAN) 8 MG tablet Take 8 mg by mouth every 8 (eight) hours as needed for nausea or vomiting.   Yes [provider]  pantoprazole (PROTONIX) 40 MG tablet Take 40 mg by mouth daily. 03/27/19  Yes [provider]  pravastatin (PRAVACHOL) 20 MG tablet Take 20 mg by mouth daily.   Yes [provider]  sevelamer carbonate (RENVELA) 800 MG tablet Take 800 mg by mouth 3 (three) times daily with meals.   Yes [provider]  cloNIDine (CATAPRES) 0.1 MG tablet Take 0.1 mg by mouth 2 (two) times daily.    [provider]  cloNIDine (CATAPRES) 0.3 MG tablet Take 0.3 mg by mouth 3 (three) times daily.  [provider]  levETIRAcetam (KEPPRA) 1000 MG tablet Take 1,000 mg by mouth 2 (two) times daily.    [provider]  levETIRAcetam (KEPPRA) 500 MG tablet Take 500 mg by mouth 3 (three) times a week.    [provider]  metoprolol (LOPRESSOR) 50 MG tablet Take 50 mg by mouth 2 (two) times daily.    [provider]    Allergies Patient has no known allergies.  History reviewed. No pertinent family history.  Social History Social History   Tobacco Use   Smoking status: Never Smoker    Smokeless tobacco: Never Used  Substance Use Topics   Alcohol use: No   Drug use: No    Review of Systems  Constitutional: No fever/chills Eyes: No visual changes. ENT: No sore throat. Cardiovascular: Positive for chest pain. Respiratory: Positive for cough and shortness of breath. Gastrointestinal: No abdominal pain.  No nausea, no vomiting.  No diarrhea.  No constipation. Genitourinary: Negative for dysuria. Musculoskeletal: Negative for back pain. Skin: Negative for rash. Neurological: Negative for headaches, focal weakness or numbness.   ____________________________________________   PHYSICAL EXAM:  VITAL SIGNS: ED Triage Vitals  Enc Vitals Group     BP      Pulse      Resp      Temp      Temp src      SpO2      Weight      Height      Head Circumference      Peak Flow      Pain Score      Pain Loc      Pain Edu?      Excl. in [place]?     Constitutional: Alert and oriented.  Cachectic appearing and in moderate acute distress. Eyes: Conjunctivae are normal. PERRL. EOMI. Head: Atraumatic. Nose: No congestion/rhinnorhea. Mouth/Throat: Mucous membranes are moist.  Oropharynx non-erythematous. Neck: No stridor.   Cardiovascular: Normal rate, regular rhythm. Grossly normal heart sounds.  Good peripheral circulation. Respiratory: Increased respiratory effort.  No retractions. Lungs with audible wheezing. Gastrointestinal: Soft and nontender. No distention. No abdominal bruits. No CVA tenderness. Musculoskeletal: No lower extremity tenderness nor edema.  No joint effusions. Neurologic:  Normal speech and language. No gross focal neurologic deficits are appreciated.  Skin:  Skin is warm, dry and intact. No rash noted. Psychiatric: Mood and affect are normal. Speech and behavior are normal.  ____________________________________________   LABS (all labs ordered are listed, but only abnormal results are displayed)  Labs Reviewed  CBC WITH DIFFERENTIAL/PLATELET -  Abnormal; Notable for the following components:      Result Value   WBC 14.0 (*)    RBC 3.23 (*)    Hemoglobin 10.2 (*)    HCT 32.2 (*)    Neutro Abs 11.9 (*)    Monocytes Absolute 1.2 (*)    All other components within normal limits  COMPREHENSIVE METABOLIC PANEL - Abnormal; Notable for the following components:   Glucose, Bld 106 (*)    BUN 41 (*)    Creatinine, Ser 7.70 (*)    Calcium 8.8 (*)    AST 10 (*)    GFR calc non Af Amer 7 (*)    GFR calc Af Amer 8 (*)    All other components within normal limits  TROPONIN I (HIGH SENSITIVITY) - Abnormal; Notable for the following components:   Troponin I (High Sensitivity) 33 (*)    All other components within normal limits  SARS CORONAVIRUS 2 (HOSPITAL ORDER, [place] [facility])  CULTURE, BLOOD (ROUTINE X 2)  CULTURE, BLOOD (ROUTINE X 2)  LACTIC ACID, PLASMA  LACTIC ACID, PLASMA  HIV ANTIBODY (ROUTINE TESTING W REFLEX)  TSH  BASIC METABOLIC PANEL  CBC  TROPONIN I (HIGH SENSITIVITY)   ____________________________________________  EKG  ED ECG REPORT I, Corrion Stirewalt J, the attending physician, personally viewed and interpreted this ECG.   Date: 04/09/2019  EKG Time: 0136  Rate: 82  Rhythm: normal EKG, normal sinus rhythm  Axis: Normal  Intervals:QTC 514  ST&T Change: Nonspecific  ____________________________________________  RADIOLOGY  ED MD interpretation: More consistent with pulmonary edema and pleural effusions rather than infectious etiology  Official radiology report(s): Dg Chest Port 1 View  Result Date: 04/09/2019 CLINICAL DATA:  Initial evaluation for acute shortness of breath. EXAM: PORTABLE CHEST 1 VIEW COMPARISON:  Prior radiograph from 03/11/2017. FINDINGS: Cardiomegaly, stable. Mediastinal silhouette within normal limits. Aortic atherosclerosis. Mild chronic elevation of the right hemidiaphragm with associated right basilar scarring. Diffuse pulmonary vascular congestion with right  greater than left airspace opacity, favored to reflect edema and/or atelectasis, although infiltrates not excluded. Superimposed small right with moderate left pleural effusions. No pneumothorax. No acute osseous finding. Vascular stent overlies the left axilla. IMPRESSION: 1. Cardiomegaly with diffuse pulmonary vascular congestion and right greater than left airspace opacity. Findings favored to reflect pulmonary edema with superimposed bibasilar atelectatic changes. Possible infiltrates would be difficult to exclude, and could be considered in the correct clinical setting. 2. Superimposed small right with moderate left pleural effusions. 3. Aortic atherosclerosis. Electronically Signed   By: Jeannine Boga M.D.   On: 04/09/2019 02:08    ____________________________________________   PROCEDURES  Procedure(s) performed (including Critical Care):  Procedures   ____________________________________________   INITIAL IMPRESSION / ASSESSMENT AND PLAN / ED COURSE  As part of my medical decision making, I reviewed the following data within the [place] notes reviewed and incorporated, Labs reviewed, EKG interpreted, Old chart reviewed, Radiograph reviewed, Discussed with admitting physician and Notes from prior ED visits     Scott Castro was evaluated in Emergency Department on 04/09/2019 for the symptoms described in the history of present illness. He was evaluated in the context of the global COVID-19 pandemic, which necessitated consideration that the patient might be at risk for infection with the SARS-CoV-2 virus that causes COVID-19. Institutional protocols and algorithms that pertain to the evaluation of patients at risk for COVID-19 are in a state of rapid change based on information released by regulatory bodies including the CDC and federal and state organizations. These policies and algorithms were followed during the patient's care in the ED.   63 year old  male with ESRD, interstitial lung disease and COPD who presents with respiratory distress. Differential includes, but is not limited to, viral syndrome, bronchitis including COPD exacerbation, pneumonia, reactive airway disease including asthma, CHF including exacerbation with or without pulmonary/interstitial edema, pneumothorax, ACS, thoracic trauma, and pulmonary embolism.  Will obtain basic lab work, chest x-ray, COVID swab in anticipation for hospitalization.  Will administer IV Solu-Medrol and duo nebs for audible wheezing heard on exam.  Clinical Course as of Apr 08 720  Mon Apr 09, 2019  0212 Chest x-ray noted.  Although patient does not give history of fever and cough most likely secondary to pulmonary edema, will obtain blood cultures and lactic acid.   [JS]  0330 Patient resting in no acute distress.  Discussed with hospitalist who will  evaluate patient in the emergency department for admission.  Lactic acid unremarkable.  Will hold IV antibiotic.   [JS]    Clinical Course User Index [JS] Paulette Blanch, MD     ____________________________________________   FINAL CLINICAL IMPRESSION(S) / ED DIAGNOSES  Final diagnoses:  COPD exacerbation ([place])  Acute pulmonary edema ([place])  Hypoxia  ESRD (end stage renal disease) on dialysis [facility] [address])     ED Discharge Orders    None       Note:  This document was prepared using Dragon voice recognition software and may include unintentional dictation errors.   Paulette Blanch, MD 04/09/19 [id number]

## 2019-04-09 NOTE — Progress Notes (Signed)
Hd started  

## 2019-04-09 NOTE — Consult Note (Signed)
[facility] CONSULT NOTE    Date: 04/09/2019                  Patient Name:  Scott Castro  MRN: [id number]  DOB: 1956/08/09  Age / Sex: 63 y.o., male         PCP: Franciso Bend, MD                 Service Requesting Consult: Hospitalist                 Reason for Consult:  Evaluation and  management of ESRD.            History of Present Illness: Patient is a 63 y.o. male with a PMHx of ESRD on HD TTS, history of multiple lung nodules, history of FSGS, anemia chronic kidney disease, secondary hyperparathyroidism, Barrett's esophagus,, history of renal transplantation in the past who was admitted to [facility] on 04/09/2019 for evaluation of increasing shortness of breath.  Patient states that over the past several days he has been having progressive shortness of breath.  He was having a cough productive of brownish sputum.  He states that he did have hemodialysis performed on Saturday and actually left under his dry weight.  Upon arrival here he was found to have a pulse ox of 89%.  With oxygen this improved to 97%.  He was tested for COVID-19 which was found to be negative.  Chest x-ray showed cardiomegaly with diffuse pulmonary vascular congestion.  Therefore hospitalist has requested that we perform dialysis today to try to get the patient below his dry weight.   Medications: Outpatient medications: Medications Prior to Admission  Medication Sig Dispense Refill Last Dose  . aspirin 81 MG tablet Take 81 mg by mouth daily.   04/08/2019 at Unknown time  . cinacalcet (SENSIPAR) 30 MG tablet Take 60 mg by mouth daily.   04/08/2019 at Unknown time  . enalapril (VASOTEC) 20 MG tablet Take 20 mg by mouth daily.   04/08/2019 at Unknown time  . esomeprazole (NEXIUM) 40 MG capsule Take 40 mg by mouth daily at 12 noon.   04/08/2019 at Unknown time  . fexofenadine (ALLEGRA) 180 MG tablet Take 180 mg by mouth daily.   04/08/2019 at Unknown time  . metoprolol (LOPRESSOR) 100 MG tablet  Take 100 mg by mouth 2 (two) times daily.   04/08/2019 at Unknown time  . minoxidil (LONITEN) 2.5 MG tablet Take by mouth 2 (two) times daily.   04/08/2019 at Unknown time  . multivitamin (RENA-VIT) TABS tablet Take 1 tablet by mouth daily.   04/08/2019 at Unknown time  . ondansetron (ZOFRAN) 8 MG tablet Take 8 mg by mouth every 8 (eight) hours as needed for nausea or vomiting.   prn at prn  . pantoprazole (PROTONIX) 40 MG tablet Take 40 mg by mouth daily.   04/08/2019 at Unknown time  . pravastatin (PRAVACHOL) 20 MG tablet Take 20 mg by mouth daily.   04/08/2019 at Unknown time  . sevelamer carbonate (RENVELA) 800 MG tablet Take 800 mg by mouth 3 (three) times daily with meals.   04/08/2019 at Unknown time  . cloNIDine (CATAPRES) 0.1 MG tablet Take 0.1 mg by mouth 2 (two) times daily.   unknown at unknown  . cloNIDine (CATAPRES) 0.3 MG tablet Take 0.3 mg by mouth 3 (three) times daily.   unknown at [facility]  . levETIRAcetam (KEPPRA) 1000 MG tablet Take 1,000 mg by mouth 2 (two)  times daily.   Not Taking at Unknown time  . levETIRAcetam (KEPPRA) 500 MG tablet Take 500 mg by mouth 3 (three) times a week.   Not Taking at Unknown time  . metoprolol (LOPRESSOR) 50 MG tablet Take 50 mg by mouth 2 (two) times daily.   Not Taking at Unknown time    Current medications: Current Facility-Administered Medications  Medication Dose Route Frequency Provider Last Rate Last Dose  . 0.9 %  sodium chloride infusion  250 mL Intravenous PRN Mansy, Jan A, MD      . 0.9 %  sodium chloride infusion  100 mL Intravenous PRN Savannah Erbe, MD      . 0.9 %  sodium chloride infusion  100 mL Intravenous PRN Matthan Sledge, MD      . acetaminophen (TYLENOL) tablet 650 mg  650 mg Oral Q6H PRN Mansy, Jan A, MD       Or  . acetaminophen (TYLENOL) suppository 650 mg  650 mg Rectal Q6H PRN Mansy, Jan A, MD      . alteplase (CATHFLO ACTIVASE) injection 2 mg  2 mg Intracatheter Once PRN Celesta Funderburk, MD      . aspirin EC tablet  81 mg  81 mg Oral Daily Mansy, Jan A, MD      . Chlorhexidine Gluconate Cloth 2 % PADS 6 each  6 each Topical Q0600 Holley Raring, Kerly Rigsbee, MD   6 each at 04/09/19 1201  . chlorpheniramine-HYDROcodone (TUSSIONEX) 10-8 MG/5ML suspension 5 mL  5 mL Oral Q12H PRN Mansy, Jan A, MD      . cinacalcet (SENSIPAR) tablet 60 mg  60 mg Oral Q breakfast Mansy, Jan A, MD      . cloNIDine (CATAPRES) tablet 0.3 mg  0.3 mg Oral TID Mansy, Jan A, MD      . feeding supplement (NEPRO CARB STEADY) liquid 237 mL  237 mL Oral BID BM Mody, Sital, MD      . furosemide (LASIX) injection 60 mg  60 mg Intravenous Q12H Mansy, Jan A, MD      . guaiFENesin (MUCINEX) 12 hr tablet 600 mg  600 mg Oral BID Mansy, Jan A, MD      . heparin injection 1,000 Units  1,000 Units Dialysis PRN Holley Raring, Neeti Knudtson, MD      . heparin injection 5,000 Units  5,000 Units Subcutaneous Q8H Mansy, Jan A, MD      . hydrALAZINE (APRESOLINE) injection 10 mg  10 mg Intravenous Q6H PRN Mansy, Arvella Merles, MD   Stopped at 04/09/19 [phone]  . ipratropium-albuterol (DUONEB) 0.5-2.5 (3) MG/3ML nebulizer solution 3 mL  3 mL Nebulization QID Mansy, Jan A, MD   3 mL at 04/09/19 0736  . lidocaine (PF) (XYLOCAINE) 1 % injection 5 mL  5 mL Intradermal PRN Cindel Daugherty, MD      . lidocaine-prilocaine (EMLA) cream 1 application  1 application Topical PRN Chloe Bluett, MD      . magnesium hydroxide (MILK OF MAGNESIA) suspension 30 mL  30 mL Oral Daily PRN Mansy, Jan A, MD      . MEDLINE mouth rinse  15 mL Mouth Rinse BID Mody, Sital, MD      . methylPREDNISolone sodium succinate (SOLU-MEDROL) 125 mg/2 mL injection 60 mg  60 mg Intravenous Q8H Mansy, Jan A, MD      . metoprolol tartrate (LOPRESSOR) tablet 100 mg  100 mg Oral BID Mansy, Jan A, MD      . minoxidil (LONITEN) tablet 2.5 mg  2.5 mg Oral BID Mansy, Jan A, MD      . multivitamin (RENA-VIT) tablet 1 tablet  1 tablet Oral Daily Mansy, Jan A, MD      . ondansetron [facility]) injection 4 mg  4 mg Intravenous Q6H PRN Mansy,  Jan A, MD      . ondansetron [facility]) tablet 8 mg  8 mg Oral Q8H PRN Mansy, Jan A, MD      . pantoprazole (PROTONIX) EC tablet 40 mg  40 mg Oral Daily Mansy, Jan A, MD      . pentafluoroprop-tetrafluoroeth (GEBAUERS) aerosol 1 application  1 application Topical PRN Holley Raring, Candee Hoon, MD      . pravastatin (PRAVACHOL) tablet 20 mg  20 mg Oral Daily Mansy, Jan A, MD      . sevelamer carbonate (RENVELA) tablet 800 mg  800 mg Oral TID WC Mansy, Jan A, MD      . sodium chloride flush (NS) 0.9 % injection 3 mL  3 mL Intravenous Q12H Mansy, Jan A, MD   3 mL at 04/09/19 1202  . sodium chloride flush (NS) 0.9 % injection 3 mL  3 mL Intravenous PRN Mansy, Jan A, MD      . traZODone (DESYREL) tablet 25 mg  25 mg Oral QHS PRN Mansy, Arvella Merles, MD          Allergies: No Known Allergies    Past Medical History: Past Medical History:  Diagnosis Date  . Barrett's esophagus   . Chronic kidney disease    CHRONIC\  . Glomerulosclerosis, focal   . Hyperparathyroidism due to renal insufficiency [facility])      Past Surgical History: Past Surgical History:  Procedure Laterality Date  . AV FISTULA PLACEMENT    . COLONOSCOPY N/A 03/22/2016   Procedure: COLONOSCOPY;  Surgeon: Manya Silvas, MD;  Location[facility] ENDOSCOPY;  Service: Endoscopy;  Laterality: N/A;  . DG AV DIALYSIS GRAFT DECLOT OR    . ESOPHAGOGASTRODUODENOSCOPY (EGD) WITH PROPOFOL  03/22/2016   Procedure: ESOPHAGOGASTRODUODENOSCOPY (EGD) WITH PROPOFOL;  Surgeon: Manya Silvas, MD;  Location: [facility] ENDOSCOPY;  Service: Endoscopy;;  . FLEXIBLE BRONCHOSCOPY    . KIDNEY TRANSPLANT Right 1985  . REMOVAL TENCKHOFF CATH       Family History: History reviewed. No pertinent family history.   Social History: Social History   Socioeconomic History  . Marital status: Single    Spouse name: Not on file  . Number of children: Not on file  . Years of education: Not on file  . Highest education level: Not on file  Occupational History  . Not on  file  Social Needs  . Financial resource strain: Not on file  . Food insecurity    Worry: Not on file    Inability: Not on file  . Transportation needs    Medical: Not on file    Non-medical: Not on file  Tobacco Use  . Smoking status: Never Smoker  . Smokeless tobacco: Never Used  Substance and Sexual Activity  . Alcohol use: No  . Drug use: No  . Sexual activity: Not on file  Lifestyle  . Physical activity    Days per week: Not on file    Minutes per session: Not on file  . Stress: Not on file  Relationships  . Social Herbalist on phone: Not on file    Gets together: Not on file    Attends religious service: Not on file    Active member  of club or organization: Not on file    Attends meetings of clubs or organizations: Not on file    Relationship status: Not on file  . Intimate partner violence    Fear of current or ex partner: Not on file    Emotionally abused: Not on file    Physically abused: Not on file    Forced sexual activity: Not on file  Other Topics Concern  . Not on file  Social History Narrative  . Not on file     Review of Systems: Review of Systems  Constitutional: Positive for malaise/fatigue. Negative for chills and fever.  HENT: Positive for congestion. Negative for hearing loss and tinnitus.   Eyes: Negative for blurred vision and double vision.  Respiratory: Positive for cough, sputum production and shortness of breath.   Cardiovascular: Negative for chest pain and leg swelling.  Gastrointestinal: Negative for heartburn, nausea and vomiting.  Genitourinary: Negative for dysuria, frequency and urgency.  Musculoskeletal: Negative for myalgias and neck pain.  Skin: Negative for itching and rash.  Neurological: Negative for dizziness and focal weakness.  Endo/Heme/Allergies: Negative for polydipsia. Does not bruise/bleed easily.  Psychiatric/Behavioral: Negative for depression. The patient is not nervous/anxious.      Vital  Signs: Blood pressure (!) 164/73, pulse 78, temperature 98 F (36.7 C), temperature source Oral, resp. rate 19, height 5\' 9"  (1.753 m), weight 71.5 kg, SpO2 95 %.  Weight trends: Filed Weights   04/09/19 0141 04/09/19 1300  Weight: 70 kg 71.5 kg    Physical Exam: General: NAD   Head: Normocephalic, atraumatic.  Eyes: Anicteric, EOMI  Nose: Mucous membranes moist, not inflammed, nonerythematous.  Throat: Oropharynx nonerythematous, no exudate appreciated.   Neck: Supple, trachea midline.  Lungs:  Normal respiratory effort. Clear to auscultation BL without crackles or wheezes.  Heart: RRR. S1 and S2 normal without gallop, murmur, or rubs.  Abdomen:  BS normoactive. Soft, Nondistended, non-tender.    Extremities: No pretibial edema.  Neurologic: A&O X3, Motor strength is 5/5 in the all 4 extremities  Skin: No visible rashes, scars.    Lab results: Basic Metabolic Panel: Recent Labs  Lab 04/09/19 0155 04/09/19 0822  NA 141 140  K 4.4 4.6  CL 100 98  CO2 30 27  GLUCOSE 106* 124*  BUN 41* 45*  CREATININE 7.70* 8.16*  CALCIUM 8.8* 8.5*  PHOS  --  4.6    Liver Function Tests: Recent Labs  Lab 04/09/19 0155  AST 10*  ALT 9  ALKPHOS 77  BILITOT 0.6  PROT 7.6  ALBUMIN 3.8   No results for input(s): LIPASE, AMYLASE in the last 168 hours. No results for input(s): AMMONIA in the last 168 hours.  CBC: Recent Labs  Lab 04/09/19 0155 04/09/19 0822  WBC 14.0* 6.7  NEUTROABS 11.9*  --   HGB 10.2* 9.8*  HCT 32.2* 30.3*  MCV 99.7 98.1  PLT 213 206    Cardiac Enzymes: No results for input(s): CKTOTAL, CKMB, CKMBINDEX, TROPONINI in the last 168 hours.  BNP: Invalid input(s): POCBNP  CBG: No results for input(s): GLUCAP in the last 168 hours.  Microbiology: Results for orders placed or performed during the hospital encounter of 04/09/19  SARS Coronavirus 2 [facility] order, Performed in [facility] hospital lab) Nasopharyngeal Nasopharyngeal Swab     Status:  None   Collection Time: 04/09/19  1:55 AM   Specimen: Nasopharyngeal Swab  Result Value Ref Range Status   SARS Coronavirus 2 NEGATIVE NEGATIVE Final  Comment: (NOTE) If result is NEGATIVE SARS-CoV-2 target nucleic acids are NOT DETECTED. The SARS-CoV-2 RNA is generally detectable in upper and lower  respiratory specimens during the acute phase of infection. The lowest  concentration of SARS-CoV-2 viral copies this assay can detect is 250  copies / mL. A negative result does not preclude SARS-CoV-2 infection  and should not be used as the sole basis for treatment or other  patient management decisions.  A negative result may occur with  improper specimen collection / handling, submission of specimen other  than nasopharyngeal swab, presence of viral mutation(s) within the  areas targeted by this assay, and inadequate number of viral copies  (<250 copies / mL). A negative result must be combined with clinical  observations, patient history, and epidemiological information. If result is POSITIVE SARS-CoV-2 target nucleic acids are DETECTED. The SARS-CoV-2 RNA is generally detectable in upper and lower  respiratory specimens dur ing the acute phase of infection.  Positive  results are indicative of active infection with SARS-CoV-2.  Clinical  correlation with patient history and other diagnostic information is  necessary to determine patient infection status.  Positive results do  not rule out bacterial infection or co-infection with other viruses. If result is PRESUMPTIVE POSTIVE SARS-CoV-2 nucleic acids MAY BE PRESENT.   A presumptive positive result was obtained on the submitted specimen  and confirmed on repeat testing.  While 2019 novel coronavirus  (SARS-CoV-2) nucleic acids may be present in the submitted sample  additional confirmatory testing may be necessary for epidemiological  and / or clinical management purposes  to differentiate between  SARS-CoV-2 and other  Sarbecovirus currently known to infect humans.  If clinically indicated additional testing with an alternate test  methodology [id number]) is advised. The SARS-CoV-2 RNA is generally  detectable in upper and lower respiratory sp ecimens during the acute  phase of infection. The expected result is Negative. Fact Sheet for Patients:  StrictlyIdeas.no Fact Sheet for Healthcare Providers: BankingDealers.co.za This test is not yet approved or cleared by the [place] FDA and has been authorized for detection and/or diagnosis of SARS-CoV-2 by FDA under an Emergency Use Authorization (EUA).  This EUA will remain in effect (meaning this test can be used) for the duration of the COVID-19 declaration under Section 564(b)(1) of the Act, 21 U.S.C. section 360bbb-3(b)(1), unless the authorization is terminated or revoked sooner. Performed at [facility] [address] [place], [place] [address]   Culture, blood (routine x 2)     Status: None (Preliminary result)   Collection Time: 04/09/19  2:41 AM   Specimen: BLOOD  Result Value Ref Range Status   Specimen Description BLOOD RIGHT HAND  Final   Special Requests   Final    BOTTLES DRAWN AEROBIC AND ANAEROBIC Blood Culture adequate volume   Culture   Final    NO GROWTH < 12 HOURS Performed at [facility], [address] [place], [place] [address]    Report Status PENDING  Incomplete  MRSA PCR Screening     Status: None   Collection Time: 04/09/19 12:25 PM   Specimen: Nasal Mucosa; Nasopharyngeal  Result Value Ref Range Status   MRSA by PCR NEGATIVE NEGATIVE Final    Comment:        The GeneXpert MRSA Assay (FDA approved for NASAL specimens only), is one component of a comprehensive MRSA colonization surveillance program. It is not intended to diagnose MRSA infection nor to guide or monitor treatment for MRSA infections.  Performed at [facility], [address] [place], [place] [address]     Coagulation Studies: No results for input(s): LABPROT, INR in the last 72 hours.  Urinalysis: No results for input(s): COLORURINE, LABSPEC, PHURINE, GLUCOSEU, HGBUR, BILIRUBINUR, KETONESUR, PROTEINUR, UROBILINOGEN, NITRITE, LEUKOCYTESUR in the last 72 hours.  Invalid input(s): APPERANCEUR    Imaging: Dg Chest Port 1 View  Result Date: 04/09/2019 CLINICAL DATA:  Initial evaluation for acute shortness of breath. EXAM: PORTABLE CHEST 1 VIEW COMPARISON:  Prior radiograph from 03/11/2017. FINDINGS: Cardiomegaly, stable. Mediastinal silhouette within normal limits. Aortic atherosclerosis. Mild chronic elevation of the right hemidiaphragm with associated right basilar scarring. Diffuse pulmonary vascular congestion with right greater than left airspace opacity, favored to reflect edema and/or atelectasis, although infiltrates not excluded. Superimposed small right with moderate left pleural effusions. No pneumothorax. No acute osseous finding. Vascular stent overlies the left axilla. IMPRESSION: 1. Cardiomegaly with diffuse pulmonary vascular congestion and right greater than left airspace opacity. Findings favored to reflect pulmonary edema with superimposed bibasilar atelectatic changes. Possible infiltrates would be difficult to exclude, and could be considered in the correct clinical setting. 2. Superimposed small right with moderate left pleural effusions. 3. Aortic atherosclerosis. Electronically Signed   By: Jeannine Boga M.D.   On: 04/09/2019 02:08      Assessment & Plan: Pt is a 63 y.o. male with a PMHx of ESRD on HD TTS, history of multiple lung nodules, history of FSGS, anemia chronic kidney disease, secondary hyperparathyroidism, Barrett's esophagus,, history of renal transplantation in the past who was admitted to [facility] on 04/09/2019 for evaluation of increasing shortness of breath.   [facility]/[address]/TTHS/EDW 68.5  1.  ESRD on  HD TTS.  Patient presents now with significant shortness of breath and what appears to be pulmonary edema on chest x-ray.  Therefore we will plan for an extra dialysis treatment today.  We will also plan for dialysis tomorrow.  2.  Pulmonary edema.  Noted on chest x-ray.  We will plan for an extra dialysis treatment today for additional ultrafiltration.  3.  Anemia of chronic kidney disease.  Hemoglobin 9.8.  Start the patient on Epogen 4000 and IV with dialysis.  4.  Secondary hyperparathyroidism.  Phosphorus currently 4.6 and acceptable.  Continue to monitor Bowman metabolism parameters.

## 2019-04-09 NOTE — Progress Notes (Signed)
Established hemodialysis patient known at [facility] [address]) TTS 11:45. Patient drives self to treatments. Please contact me with any dialysis placement concerns.  Elvera Bicker Dialysis Coordinator [phone]

## 2019-04-09 NOTE — Progress Notes (Signed)
This note also relates to the following rows which could not be included: Pulse Rate - Cannot attach notes to unvalidated device data Resp - Cannot attach notes to unvalidated device data BP - Cannot attach notes to unvalidated device data  Hd completed  

## 2019-04-09 NOTE — Progress Notes (Signed)
PT Cancellation Note  Patient Details Name: Scott Castro MRN: [id number] DOB: 1956/08/05   Cancelled Treatment:    Reason Eval/Treat Not Completed: Patient at procedure or test/unavailable.  Order received and chart reviewed.  Pt currently off the floor for dialysis.  Wil re-attempt when pt available and appropriate.  Roxanne Gates, PT, DPT  Roxanne Gates 04/09/2019, 1:18 PM

## 2019-04-09 NOTE — Progress Notes (Signed)
Dr. Benjie Karvonen called and updated regarding BP of 212/94. This RN had given 0.1mg  clonidine as ordered, upon attempting to give 10mg  IV hydralazine & 60mg  IV lasix I found the IV to be infiltrated. Dr. Benjie Karvonen gave orders for 1 inch nitropaste. IV team consulted and will await for IV access and continue to monitor BP. The patient's BP was rechecked at 0808 and found to be 150/68. Report given to [place], RN on all of the above.

## 2019-04-09 NOTE — Progress Notes (Signed)
CRITICAL VALUE ALERT  Critical Value:  Troponin 75   Date & Time Notied:  04/09/19 0930  Provider Notified: Mody   Orders Received/Actions taken: N/A

## 2019-04-09 NOTE — Progress Notes (Signed)
Pre HD Tx assessment   04/09/19 1300  Neurological  Level of Consciousness Alert  Orientation Level Oriented X4  Respiratory  Respiratory Pattern Regular;Unlabored  Chest Assessment Chest expansion symmetrical  Bilateral Breath Sounds Diminished;Clear  Cardiac  Pulse Regular  Heart Sounds S1, S2  Jugular Venous Distention (JVD) No  ECG Monitor Yes  Cardiac Rhythm NSR  Antiarrhythmic device No  Vascular  R Radial Pulse +2  L Radial Pulse +2  Edema Generalized  Generalized Edema Other (Comment) (non pitting)  Psychosocial  Psychosocial (WDL) WDL

## 2019-04-09 NOTE — ED Notes (Signed)
ED TO INPATIENT HANDOFF REPORT  ED Nurse Name and Phone #:  Islay Polanco [phone]  S Name/Age/Gender Scott Castro 63 y.o. male Room/Bed: ED01A/ED01A  Code Status   Code Status: Full Code  Home/SNF/Other Home Patient oriented to: self, place, time and situation Is this baseline? Yes   Triage Complete: Triage complete  Chief Complaint [facility] - Difficulty breathing  Triage Note Pt arrived via [facility] from home with SOB for x2 days. Pt is a dialysis pt and last treatment was Sat. Pt states that he has to stop to catch his breath which is not normal for him. Pt has a hx of COPD and HTN.    Allergies No Known Allergies  Level of Care/Admitting Diagnosis ED Disposition    ED Disposition Condition [facility]: [place] [[id number]]  Level of Care: Telemetry [5]  Covid Evaluation: Confirmed COVID Negative  Diagnosis: COPD exacerbation [facility]) [[id number]]  Admitting Physician: Christel Mormon [[id number]]  Attending Physician: Christel Mormon [[id number]]  Estimated length of stay: 3 - 4 days  Certification:: I certify this patient will need inpatient services for at least 2 midnights  PT Class (Do Not Modify): Inpatient [101]  PT Acc Code (Do Not Modify): Private [1]       B Medical/Surgery History Past Medical History:  Diagnosis Date  . Barrett's esophagus   . Chronic kidney disease    CHRONIC\  . Glomerulosclerosis, focal   . Hyperparathyroidism due to renal insufficiency [facility])    Past Surgical History:  Procedure Laterality Date  . AV FISTULA PLACEMENT    . COLONOSCOPY N/A 03/22/2016   Procedure: COLONOSCOPY;  Surgeon: Manya Silvas, MD;  Location: [facility] ENDOSCOPY;  Service: Endoscopy;  Laterality: N/A;  . DG AV DIALYSIS GRAFT DECLOT OR    . ESOPHAGOGASTRODUODENOSCOPY (EGD) WITH PROPOFOL  03/22/2016   Procedure: ESOPHAGOGASTRODUODENOSCOPY (EGD) WITH PROPOFOL;  Surgeon: Manya Silvas, MD;  Location: [facility] ENDOSCOPY;  Service: Endoscopy;;   . FLEXIBLE BRONCHOSCOPY    . KIDNEY TRANSPLANT Right 1985  . REMOVAL TENCKHOFF CATH       A IV Location/Drains/Wounds Patient Lines/Drains/Airways Status   Active Line/Drains/Airways    Name:   Placement date:   Placement time:   Site:   Days:   Peripheral IV 04/09/19 Right Hand   04/09/19    0155    Hand   less than 1          Intake/Output Last 24 hours No intake or output data in the 24 hours ending 04/09/19 0538  Labs/Imaging Results for orders placed or performed during the hospital encounter of 04/09/19 (from the past 48 hour(s))  CBC with Differential     Status: Abnormal   Collection Time: 04/09/19  1:55 AM  Result Value Ref Range   WBC 14.0 (H) 4.0 - 10.5 K/uL   RBC 3.23 (L) 4.22 - 5.81 MIL/uL   Hemoglobin 10.2 (L) 13.0 - 17.0 g/dL   HCT 32.2 (L) 39.0 - 52.0 %   MCV 99.7 80.0 - 100.0 fL   MCH 31.6 26.0 - 34.0 pg   MCHC 31.7 30.0 - 36.0 g/dL   RDW 13.8 11.5 - 15.5 %   Platelets 213 150 - 400 K/uL   nRBC 0.0 0.0 - 0.2 %   Neutrophils Relative % 85 %   Neutro Abs 11.9 (H) 1.7 - 7.7 K/uL   Lymphocytes Relative 6 %   Lymphs Abs 0.8 0.7 - 4.0 K/uL  Monocytes Relative 8 %   Monocytes Absolute 1.2 (H) 0.1 - 1.0 K/uL   Eosinophils Relative 1 %   Eosinophils Absolute 0.1 0.0 - 0.5 K/uL   Basophils Relative 0 %   Basophils Absolute 0.1 0.0 - 0.1 K/uL   Immature Granulocytes 0 %   Abs Immature Granulocytes 0.05 0.00 - 0.07 K/uL    Comment: Performed at [facility], [address] [place], [place] [address]  Comprehensive metabolic panel     Status: Abnormal   Collection Time: 04/09/19  1:55 AM  Result Value Ref Range   Sodium 141 135 - 145 mmol/L   Potassium 4.4 3.5 - 5.1 mmol/L   Chloride 100 98 - 111 mmol/L   CO2 30 22 - 32 mmol/L   Glucose, Bld 106 (H) 70 - 99 mg/dL   BUN 41 (H) 8 - 23 mg/dL   Creatinine, Ser 7.70 (H) 0.61 - 1.24 mg/dL   Calcium 8.8 (L) 8.9 - 10.3 mg/dL   Total Protein 7.6 6.5 - 8.1 g/dL   Albumin 3.8 3.5 - 5.0 g/dL   AST 10  (L) 15 - 41 U/L   ALT 9 0 - 44 U/L   Alkaline Phosphatase 77 38 - 126 U/L   Total Bilirubin 0.6 0.3 - 1.2 mg/dL   GFR calc non Af Amer 7 (L) >60 mL/min   GFR calc Af Amer 8 (L) >60 mL/min   Anion gap 11 5 - 15    Comment: Performed at [facility], [address], [place], [place] [address]  Troponin I (High Sensitivity)     Status: Abnormal   Collection Time: 04/09/19  1:55 AM  Result Value Ref Range   Troponin I (High Sensitivity) 33 (H) <18 ng/L    Comment: (NOTE) Elevated high sensitivity troponin I (hsTnI) values and significant  changes across serial measurements may suggest ACS but many other  chronic and acute conditions are known to elevate hsTnI results.  Refer to the "Links" section for chest pain algorithms and additional  guidance. Performed at [facility], [address] [place], [place] [address]   SARS Coronavirus 2 [facility] order, Performed in [facility] hospital lab) Nasopharyngeal Nasopharyngeal Swab     Status: None   Collection Time: 04/09/19  1:55 AM   Specimen: Nasopharyngeal Swab  Result Value Ref Range   SARS Coronavirus 2 NEGATIVE NEGATIVE    Comment: (NOTE) If result is NEGATIVE SARS-CoV-2 target nucleic acids are NOT DETECTED. The SARS-CoV-2 RNA is generally detectable in upper and lower  respiratory specimens during the acute phase of infection. The lowest  concentration of SARS-CoV-2 viral copies this assay can detect is 250  copies / mL. A negative result does not preclude SARS-CoV-2 infection  and should not be used as the sole basis for treatment or other  patient management decisions.  A negative result may occur with  improper specimen collection / handling, submission of specimen other  than nasopharyngeal swab, presence of viral mutation(s) within the  areas targeted by this assay, and inadequate number of viral copies  (<250 copies / mL). A negative result must be combined with clinical  observations, patient  history, and epidemiological information. If result is POSITIVE SARS-CoV-2 target nucleic acids are DETECTED. The SARS-CoV-2 RNA is generally detectable in upper and lower  respiratory specimens dur ing the acute phase of infection.  Positive  results are indicative of active infection with SARS-CoV-2.  Clinical  correlation with patient history and other diagnostic  information is  necessary to determine patient infection status.  Positive results do  not rule out bacterial infection or co-infection with other viruses. If result is PRESUMPTIVE POSTIVE SARS-CoV-2 nucleic acids MAY BE PRESENT.   A presumptive positive result was obtained on the submitted specimen  and confirmed on repeat testing.  While 2019 novel coronavirus  (SARS-CoV-2) nucleic acids may be present in the submitted sample  additional confirmatory testing may be necessary for epidemiological  and / or clinical management purposes  to differentiate between  SARS-CoV-2 and other Sarbecovirus currently known to infect humans.  If clinically indicated additional testing with an alternate test  methodology [phone]) is advised. The SARS-CoV-2 RNA is generally  detectable in upper and lower respiratory sp ecimens during the acute  phase of infection. The expected result is Negative. Fact Sheet for Patients:  StrictlyIdeas.no Fact Sheet for Healthcare Providers: BankingDealers.co.za This test is not yet approved or cleared by the [place] FDA and has been authorized for detection and/or diagnosis of SARS-CoV-2 by FDA under an Emergency Use Authorization (EUA).  This EUA will remain in effect (meaning this test can be used) for the duration of the COVID-19 declaration under Section 564(b)(1) of the Act, 21 U.S.C. section 360bbb-3(b)(1), unless the authorization is terminated or revoked sooner. Performed at [facility], [address] [place], [place]  [address]   Lactic acid, plasma     Status: None   Collection Time: 04/09/19  1:55 AM  Result Value Ref Range   Lactic Acid, Venous 1.8 0.5 - 1.9 mmol/L    Comment: Performed at [facility], [address] [place], [place] [address]   Dg Chest Port 1 View  Result Date: 04/09/2019 CLINICAL DATA:  Initial evaluation for acute shortness of breath. EXAM: PORTABLE CHEST 1 VIEW COMPARISON:  Prior radiograph from 03/11/2017. FINDINGS: Cardiomegaly, stable. Mediastinal silhouette within normal limits. Aortic atherosclerosis. Mild chronic elevation of the right hemidiaphragm with associated right basilar scarring. Diffuse pulmonary vascular congestion with right greater than left airspace opacity, favored to reflect edema and/or atelectasis, although infiltrates not excluded. Superimposed small right with moderate left pleural effusions. No pneumothorax. No acute osseous finding. Vascular stent overlies the left axilla. IMPRESSION: 1. Cardiomegaly with diffuse pulmonary vascular congestion and right greater than left airspace opacity. Findings favored to reflect pulmonary edema with superimposed bibasilar atelectatic changes. Possible infiltrates would be difficult to exclude, and could be considered in the correct clinical setting. 2. Superimposed small right with moderate left pleural effusions. 3. Aortic atherosclerosis. Electronically Signed   By: Jeannine Boga M.D.   On: 04/09/2019 02:08    Pending Labs Unresulted Labs (From admission, onward)    Start     Ordered   04/09/19 2725  Basic metabolic panel  Tomorrow morning,   STAT     04/09/19 0353   04/09/19 0500  CBC  Tomorrow morning,   STAT     04/09/19 0353   04/09/19 0349  TSH  Once,   STAT     04/09/19 0353   04/09/19 0347  HIV antibody (Routine Testing)  Once,   STAT     04/09/19 0353   04/09/19 0217  Culture, blood (routine x 2)  BLOOD CULTURE X 2,   STAT     04/09/19 0216   04/09/19 0217  Lactic acid, plasma  Now then  every 2 hours,   STAT     04/09/19 0216          Vitals/Pain Today's  Vitals   04/09/19 0330 04/09/19 0400 04/09/19 0500 04/09/19 0530  BP:  (!) 188/88 (!) 178/83 (!) 184/80  Pulse: 70 73 79 79  Resp: 20 (!) 24 (!) 22 (!) 21  Temp:      TempSrc:      SpO2: 98% 96% 94% 98%  Weight:      Height:      PainSc:        Isolation Precautions No active isolations  Medications Medications  aspirin EC tablet 81 mg (has no administration in time range)  cloNIDine (CATAPRES) tablet 0.1 mg (has no administration in time range)  cloNIDine (CATAPRES) tablet 0.3 mg (has no administration in time range)  enalapril (VASOTEC) tablet 20 mg (has no administration in time range)  metoprolol tartrate (LOPRESSOR) tablet 100 mg (has no administration in time range)  minoxidil (LONITEN) tablet 2.5 mg (has no administration in time range)  pravastatin (PRAVACHOL) tablet 20 mg (has no administration in time range)  cinacalcet (SENSIPAR) tablet 60 mg (has no administration in time range)  pantoprazole (PROTONIX) EC tablet 40 mg (has no administration in time range)  ondansetron (ZOFRAN) tablet 8 mg (has no administration in time range)  sevelamer carbonate (RENVELA) tablet 800 mg (has no administration in time range)  levETIRAcetam (KEPPRA) tablet 1,000 mg (has no administration in time range)  levETIRAcetam (KEPPRA) tablet 500 mg (has no administration in time range)  multivitamin (RENA-VIT) tablet 1 tablet (has no administration in time range)  heparin injection 5,000 Units (has no administration in time range)  acetaminophen (TYLENOL) tablet 650 mg (has no administration in time range)    Or  acetaminophen (TYLENOL) suppository 650 mg (has no administration in time range)  sodium chloride flush (NS) 0.9 % injection 3 mL (has no administration in time range)  sodium chloride flush (NS) 0.9 % injection 3 mL (has no administration in time range)  0.9 %  sodium chloride infusion (has no administration  in time range)  traZODone (DESYREL) tablet 25 mg (has no administration in time range)  magnesium hydroxide (MILK OF MAGNESIA) suspension 30 mL (has no administration in time range)  ondansetron (ZOFRAN) injection 4 mg (has no administration in time range)  furosemide (LASIX) injection 60 mg (has no administration in time range)  hydrALAZINE (APRESOLINE) injection 10 mg (has no administration in time range)  methylPREDNISolone sodium succinate (SOLU-MEDROL) 125 mg/2 mL injection 60 mg (has no administration in time range)  ipratropium-albuterol (DUONEB) 0.5-2.5 (3) MG/3ML nebulizer solution 3 mL (has no administration in time range)  cefTRIAXone (ROCEPHIN) 1 g in sodium chloride 0.9 % 100 mL IVPB (has no administration in time range)  azithromycin (ZITHROMAX) 500 mg in sodium chloride 0.9 % 250 mL IVPB (has no administration in time range)  guaiFENesin (MUCINEX) 12 hr tablet 600 mg (has no administration in time range)  chlorpheniramine-HYDROcodone (TUSSIONEX) 10-8 MG/5ML suspension 5 mL (has no administration in time range)  ipratropium-albuterol (DUONEB) 0.5-2.5 (3) MG/3ML nebulizer solution 3 mL (3 mLs Nebulization Given 04/09/19 0156)  methylPREDNISolone sodium succinate (SOLU-MEDROL) 125 mg/2 mL injection 125 mg (125 mg Intravenous Given 04/09/19 0156)    Mobility walks Low fall risk   Focused Assessments Pulmonary Assessment Handoff:  Lung sounds: Bilateral Breath Sounds: Diminished O2 Device: NRB O2 Flow Rate (L/min): 10 L/min      R Recommendations: See Admitting Provider Note  Report given to:   Additional Notes:

## 2019-04-09 NOTE — Progress Notes (Addendum)
Patient has $806.00 in cash, wallet, debit card, license and other cards in his wallet. Security called to lock these up but per security they would come up sometime before 7pm. Money counted in front of patient and Ashly RN witnessed. These belonging put in patient belonging envelope and returned to patient. Will wait for security to come up.   Update 1845: Security took belongings except wallet.

## 2019-04-09 NOTE — Progress Notes (Signed)
[facility] assessment    04/09/19 1700  Neurological  Level of Consciousness Alert  Orientation Level Oriented X4  Cardiac  Pulse Regular  Heart Sounds S1, S2  Jugular Venous Distention (JVD) No  ECG Monitor Yes  Cardiac Rhythm NSR  Antiarrhythmic device No  Vascular  R Radial Pulse +2  L Radial Pulse +2  Edema Generalized  Generalized Edema Other (Comment) (non pitting)  Psychosocial  Psychosocial (WDL) WDL

## 2019-04-09 NOTE — Consult Note (Signed)
[facility] Consultation Note  Patient ID: Scott Castro, MRN: [id number], DOB/AGE: Sep 06, 1955 63 y.o. Admit date: 04/09/2019   Date of Consult: 04/09/2019 Primary Physician: Franciso Bend, MD Primary Cardiologist: None  Chief Complaint:  Chief Complaint  Patient presents with  . Shortness of Breath  . Respiratory Distress   Reason for Consult: Heart failure  HPI: 63 y.o. male with known essential hypertension mixed hyperlipidemia and chronic kidney disease on dialysis who has had apparent some COPD and seen by a pulmonologist.  He claims that over the last 2 days he has had episodes where he is severely short of breath with and without physical activity and could not catch his breath.  He had some mild nausea and other symptoms which abated.  EKG shows normal sinus rhythm and troponin level consistent with baseline chronic kidney disease.  Chest x-ray has shown pulmonary edema and other diffuse changes possibly COPD.  Patient since has continued to have some shortness of breath despite furosemide and hypertension control and oxygenation.  He is currently hemodynamically stable  Past Medical History:  Diagnosis Date  . Barrett's esophagus   . Chronic kidney disease    CHRONIC\  . Glomerulosclerosis, focal   . Hyperparathyroidism due to renal insufficiency [facility])       Surgical History:  Past Surgical History:  Procedure Laterality Date  . AV FISTULA PLACEMENT    . COLONOSCOPY N/A 03/22/2016   Procedure: COLONOSCOPY;  Surgeon: Manya Silvas, MD;  Location: [facility] ENDOSCOPY;  Service: Endoscopy;  Laterality: N/A;  . DG AV DIALYSIS GRAFT DECLOT OR    . ESOPHAGOGASTRODUODENOSCOPY (EGD) WITH PROPOFOL  03/22/2016   Procedure: ESOPHAGOGASTRODUODENOSCOPY (EGD) WITH PROPOFOL;  Surgeon: Manya Silvas, MD;  Location: [facility] ENDOSCOPY;  Service: Endoscopy;;  . FLEXIBLE BRONCHOSCOPY    . KIDNEY TRANSPLANT Right 1985  . REMOVAL TENCKHOFF CATH       Home Meds: Prior to  Admission medications   Medication Sig Start Date End Date Taking? Authorizing Provider  aspirin 81 MG tablet Take 81 mg by mouth daily.   Yes [provider]  cinacalcet (SENSIPAR) 30 MG tablet Take 60 mg by mouth daily.   Yes [provider]  enalapril (VASOTEC) 20 MG tablet Take 20 mg by mouth daily.   Yes [provider]  esomeprazole (NEXIUM) 40 MG capsule Take 40 mg by mouth daily at 12 noon.   Yes [provider]  fexofenadine (ALLEGRA) 180 MG tablet Take 180 mg by mouth daily.   Yes [provider]  metoprolol (LOPRESSOR) 100 MG tablet Take 100 mg by mouth 2 (two) times daily.   Yes [provider]  minoxidil (LONITEN) 2.5 MG tablet Take by mouth 2 (two) times daily.   Yes [provider]  multivitamin (RENA-VIT) TABS tablet Take 1 tablet by mouth daily.   Yes [provider]  ondansetron (ZOFRAN) 8 MG tablet Take 8 mg by mouth every 8 (eight) hours as needed for nausea or vomiting.   Yes [provider]  pantoprazole (PROTONIX) 40 MG tablet Take 40 mg by mouth daily. 03/27/19  Yes [provider]  pravastatin (PRAVACHOL) 20 MG tablet Take 20 mg by mouth daily.   Yes [provider]  sevelamer carbonate (RENVELA) 800 MG tablet Take 800 mg by mouth 3 (three) times daily with meals.   Yes [provider]  cloNIDine (CATAPRES) 0.1 MG tablet Take 0.1 mg by mouth 2 (two) times daily.    [provider]  cloNIDine (CATAPRES) 0.3 MG tablet Take 0.3 mg by mouth 3 (three) times daily.    [provider]  levETIRAcetam (KEPPRA) 1000 MG tablet Take 1,000 mg by mouth 2 (two) times daily.    [provider]  levETIRAcetam (KEPPRA) 500 MG tablet Take 500 mg by mouth 3 (three) times a week.    [provider]  metoprolol (LOPRESSOR) 50 MG tablet Take 50 mg by mouth 2 (two) times daily.    [provider]    Inpatient Medications:  . aspirin EC  81 mg  Oral Daily  . cinacalcet  60 mg Oral Q breakfast  . cloNIDine  0.1 mg Oral BID  . cloNIDine  0.3 mg Oral TID  . furosemide  60 mg Intravenous Q12H  . guaiFENesin  600 mg Oral BID  . heparin  5,000 Units Subcutaneous Q8H  . ipratropium-albuterol  3 mL Nebulization QID  . methylPREDNISolone (SOLU-MEDROL) injection  60 mg Intravenous Q8H  . metoprolol tartrate  100 mg Oral BID  . minoxidil  2.5 mg Oral BID  . multivitamin  1 tablet Oral Daily  . pantoprazole  40 mg Oral Daily  . pravastatin  20 mg Oral Daily  . sevelamer carbonate  800 mg Oral TID WC  . sodium chloride flush  3 mL Intravenous Q12H   . sodium chloride    . azithromycin    . cefTRIAXone (ROCEPHIN)  IV      Allergies: No Known Allergies  Social History   Socioeconomic History  . Marital status: Single    Spouse name: Not on file  . Number of children: Not on file  . Years of education: Not on file  . Highest education level: Not on file  Occupational History  . Not on file  Social Needs  . Financial resource strain: Not on file  . Food insecurity    Worry: Not on file    Inability: Not on file  . Transportation needs    Medical: Not on file    Non-medical: Not on file  Tobacco Use  . Smoking status: Never Smoker  . Smokeless tobacco: Never Used  Substance and Sexual Activity  . Alcohol use: No  . Drug use: No  . Sexual activity: Not on file  Lifestyle  . Physical activity    Days per week: Not on file    Minutes per session: Not on file  . Stress: Not on file  Relationships  . Social Herbalist on phone: Not on file    Gets together: Not on file    Attends religious service: Not on file    Active member of club or organization: Not on file    Attends meetings of clubs or organizations: Not on file    Relationship status: Not on file  . Intimate partner violence    Fear of current or ex partner: Not on file    Emotionally abused: Not on file    Physically abused: Not on file     Forced sexual activity: Not on file  Other Topics Concern  . Not on file  Social History Narrative  . Not on file     History reviewed. No pertinent family history.   Review of Systems Positive for shortness of breath cough congestion Negative for: General:  chills, fever, night sweats or weight changes.  Cardiovascular: PND orthopnea syncope dizziness  Dermatological skin lesions rashes Respiratory: Positive for cough congestion Urologic: Frequent urination  urination at night and hematuria Abdominal: negative for nausea, vomiting, diarrhea, bright red blood per rectum, melena, or hematemesis Neurologic: negative for visual changes, and/or hearing changes  All other systems reviewed and are otherwise negative except as noted above.  Labs: No results for input(s): CKTOTAL, CKMB, TROPONINI in the last 72 hours. Lab Results  Component Value Date   WBC 14.0 (H) 04/09/2019   HGB 10.2 (L) 04/09/2019   HCT 32.2 (L) 04/09/2019   MCV 99.7 04/09/2019   PLT 213 04/09/2019    Recent Labs  Lab 04/09/19 0155  NA 141  K 4.4  CL 100  CO2 30  BUN 41*  CREATININE 7.70*  CALCIUM 8.8*  PROT 7.6  BILITOT 0.6  ALKPHOS 77  ALT 9  AST 10*  GLUCOSE 106*   No results found for: CHOL, HDL, LDLCALC, TRIG No results found for: DDIMER  Radiology/Studies:  Dg Chest Port 1 View  Result Date: 04/09/2019 CLINICAL DATA:  Initial evaluation for acute shortness of breath. EXAM: PORTABLE CHEST 1 VIEW COMPARISON:  Prior radiograph from 03/11/2017. FINDINGS: Cardiomegaly, stable. Mediastinal silhouette within normal limits. Aortic atherosclerosis. Mild chronic elevation of the right hemidiaphragm with associated right basilar scarring. Diffuse pulmonary vascular congestion with right greater than left airspace opacity, favored to reflect edema and/or atelectasis, although infiltrates not excluded. Superimposed small right with moderate left pleural effusions. No pneumothorax. No acute osseous finding.  Vascular stent overlies the left axilla. IMPRESSION: 1. Cardiomegaly with diffuse pulmonary vascular congestion and right greater than left airspace opacity. Findings favored to reflect pulmonary edema with superimposed bibasilar atelectatic changes. Possible infiltrates would be difficult to exclude, and could be considered in the correct clinical setting. 2. Superimposed small right with moderate left pleural effusions. 3. Aortic atherosclerosis. Electronically Signed   By: Jeannine Boga M.D.   On: 04/09/2019 02:08    EKG: Normal sinus rhythm  Weights: Filed Weights   04/09/19 0141  Weight: 70 kg     Physical Exam: Blood pressure (!) 199/88, pulse 83, temperature 97.7 F (36.5 C), temperature source Oral, resp. rate (!) 24, height 5\' 9"  (1.753 m), weight 70 kg, SpO2 94 %. Body mass index is 22.79 kg/m. General: Well developed, well nourished, in no acute distress. Head eyes ears nose throat: Normocephalic, atraumatic, sclera non-icteric, no xanthomas, nares are without discharge. No apparent thyromegaly and/or mass  Lungs: Normal respiratory effort.  Diffuse wheezes, basilar rales, some rhonchi.  Heart: RRR with normal S1 S2. no murmur gallop, no rub, PMI is normal size and placement, carotid upstroke normal without bruit, jugular venous pressure is normal Abdomen: Soft, non-tender, non-distended with normoactive bowel sounds. No hepatomegaly. No rebound/guarding. No obvious abdominal masses. Abdominal aorta is normal size without bruit Extremities: No edema. no cyanosis, no clubbing, no ulcers  Peripheral : 2+ bilateral upper extremity pulses, 2+ bilateral femoral pulses, 2+ bilateral dorsal pedal pulse Neuro: Alert and oriented. No facial asymmetry. No focal deficit. Moves all extremities spontaneously. Musculoskeletal: Normal muscle tone without kyphosis Psych:  Responds to questions appropriately with a normal affect.    Assessment: 63 year old male with hypertension  hyperlipidemia COPD and chronic kidney disease on dialysis with acute pulmonary edema with hypoxia possibly congestive heart failure and/or volume overload needing further treatment options without evidence of acute coronary syndrome  Plan: 1.  Continue antihypertensive medication management due to significant hypertension likely contributing to above 2.  Continue diuretic if able and proceed to dialysis today for further treatment of pulmonary edema and shortness of  breath 3.  Echocardiogram for LV systolic dysfunction valvular heart disease contributing to 4.  High intensity cholesterol therapy 5.  Further evaluation treatment options after improvements of about  Signed, Corey Skains M.D. [facility] 04/09/2019, 7:38 AM

## 2019-04-09 NOTE — Plan of Care (Signed)
  Problem: Education: Goal: Knowledge of disease and its progression will improve Outcome: Progressing Goal: Individualized Educational Video(s) Outcome: Progressing   Problem: Fluid Volume: Goal: Compliance with measures to maintain balanced fluid volume will improve Outcome: Progressing   Problem: Clinical Measurements: Goal: Complications related to the disease process, condition or treatment will be avoided or minimized Outcome: Progressing

## 2019-04-10 DIAGNOSIS — J449 Chronic obstructive pulmonary disease, unspecified: Secondary | ICD-10-CM | POA: Diagnosis present

## 2019-04-10 DIAGNOSIS — J441 Chronic obstructive pulmonary disease with (acute) exacerbation: Secondary | ICD-10-CM | POA: Diagnosis not present

## 2019-04-10 LAB — GLUCOSE, CAPILLARY: Glucose-Capillary: 119 mg/dL — ABNORMAL HIGH (ref 70–99)

## 2019-04-10 LAB — ECHOCARDIOGRAM COMPLETE
Height: 69 in
Weight: 2398.6 oz

## 2019-04-10 LAB — PARATHYROID HORMONE, INTACT (NO CA): PTH: 93 pg/mL — ABNORMAL HIGH (ref 15–65)

## 2019-04-10 LAB — HIV ANTIBODY (ROUTINE TESTING W REFLEX): HIV Screen 4th Generation wRfx: NONREACTIVE

## 2019-04-10 MED ORDER — IPRATROPIUM-ALBUTEROL 0.5-2.5 (3) MG/3ML IN SOLN
3.0000 mL | Freq: Three times a day (TID) | RESPIRATORY_TRACT | Status: DC
  Administered 2019-04-10: 3 mL via RESPIRATORY_TRACT
  Filled 2019-04-10: qty 3

## 2019-04-10 NOTE — Progress Notes (Signed)
SATURATION QUALIFICATIONS: (This note is used to comply with regulatory documentation for home oxygen)  Patient Saturations on Room Air at Rest = 97%  Patient Saturations on Room Air while Ambulating = 98%   Please briefly explain why patient needs home oxygen:  Pt. Had episode of desaturations with PT. RN to do home o2 eval to see if pt. Needs o2. Pt. Does not qualify for o2. Will continue to discharge patient.

## 2019-04-10 NOTE — Progress Notes (Signed)
OT Cancellation Note  Patient Details Name: Scott Castro MRN: [id number] DOB: 1956/01/18   Cancelled Treatment:    Reason Eval/Treat Not Completed: Other (comment). Pt with nursing upon attempt. Will re-attempt OT evaluation at later date/time as pt is available and medically appropriate.   Jeni Salles, MPH, MS, OTR/L ascom [phone] 04/10/19, 4:03 PM

## 2019-04-10 NOTE — Care Management CC44 (Signed)
Condition Code 44 Documentation Completed  Patient Details  Name: Scott Castro MRN: [id number] Date of Birth: January 28, 1956   Condition Code 44 given:  Yes Patient signature on Condition Code 44 notice:  Yes Documentation of 2 MD's agreement:  Yes Code 44 added to claim:  Yes    Ross Ludwig, LCSW 04/10/2019, 2:31 PM

## 2019-04-10 NOTE — Evaluation (Signed)
Physical Therapy Evaluation Patient Details Name: DEMARQUIS OSLEY MRN: [id number] DOB: 01/04/1956 Today's Date: 04/10/2019   History of Present Illness  Pt is a 63 year old male who comes to the hospital with a COPD exacerbation.  PMH includes ESRD, Barrett's esophagus and glomerulosclerosis.  Clinical Impression  Pt is a 63 year old male who lives in a one story home alone.  Pt able to perform bed mobility and transfers independently.  He was able to ambulate 10-15 ft safely and then began to experience lateral deviations in his gait pattern.  Pt continue to try to walk but PT encouraged him  To return to his chair, where his O2 measured 88%.  He was able to recover to 97% through deep breathing and rest.  Pt presented with good strength of UE/LE's and reported no new N/T in extremities.  Pt may qualify for O2 but is not a candidate for PT at this time.  He may benefit from the use of a SPC in the case of experiencing an O2 desat again as pt plans to return to work.  Please consult if future need arises.    Follow Up Recommendations No PT follow up    Equipment Recommendations  Cane    Recommendations for Other Services       Precautions / Restrictions Precautions Precautions: Fall      Mobility  Bed Mobility Overal bed mobility: Independent                Transfers Overall transfer level: Independent                  Ambulation/Gait Ambulation/Gait assistance: Min guard Gait Distance (Feet): 40 Feet       Gait velocity interpretation: 1.31 - 2.62 ft/sec, indicative of limited community ambulator General Gait Details: Good foot clearance, step length during ambulation.  Pt started experiencing a lateral displacement and 2 LOB's as walking progressed and O2 sats measured 88%.  Pt stated that this has been happening for "a few weeks".  Stairs            Wheelchair Mobility    Modified Rankin (Stroke Patients Only)       Balance Overall balance  assessment: Modified Independent(Able to walk without AD until O2 began to desat.  Then pt required intermittent use of UE's to support himself when walking.)                                           Pertinent Vitals/Pain Pain Assessment: No/denies pain    Home Living Family/patient expects to be discharged to:: Private residence Living Arrangements: Alone Available Help at Discharge: Friend(s);Available PRN/intermittently                  Prior Function Level of Independence: Independent         Comments: Works full time.     Hand Dominance        Extremity/Trunk Assessment   Upper Extremity Assessment Upper Extremity Assessment: Overall WFL for tasks assessed    Lower Extremity Assessment Lower Extremity Assessment: Overall WFL for tasks assessed(Ankle PF/DF, knee flexion/extension: 4/5 bilaterally.)       Communication   Communication: No difficulties  Cognition Arousal/Alertness: Awake/alert Behavior During Therapy: WFL for tasks assessed/performed Overall Cognitive Status: Within Functional Limits for tasks assessed  General Comments: Pt hesitant to respond to therapist's questions.      General Comments      Exercises     Assessment/Plan    PT Assessment Patent does not need any further PT services  PT Problem List         PT Treatment Interventions      PT Goals (Current goals can be found in the Care Plan section)  Acute Rehab PT Goals Patient Stated Goal: To return to work as soon as possible. PT Goal Formulation: With patient Time For Goal Achievement: 04/24/19 Potential to Achieve Goals: Good    Frequency     Barriers to discharge        Co-evaluation               AM-PAC PT "6 Clicks" Mobility  Outcome Measure Help needed turning from your back to your side while in a flat bed without using bedrails?: None Help needed moving from lying on your back to  sitting on the side of a flat bed without using bedrails?: None Help needed moving to and from a bed to a chair (including a wheelchair)?: None Help needed standing up from a chair using your arms (e.g., wheelchair or bedside chair)?: None Help needed to walk in hospital room?: None Help needed climbing 3-5 steps with a railing? : None 6 Click Score: 24    End of Session Equipment Utilized During Treatment: Gait belt Activity Tolerance: Other (comment)(Pt affected by O2 desat.) Patient left: in chair;with chair alarm set;with call bell/phone within reach Nurse Communication: Mobility status PT Visit Diagnosis: Unsteadiness on feet (R26.81)    Time: 5573-2202 PT Time Calculation (min) (ACUTE ONLY): 19 min   Charges:   PT Evaluation $PT Eval Low Complexity: 1 Low          Roxanne Gates, PT, DPT   Roxanne Gates 04/10/2019, 2:38 PM

## 2019-04-10 NOTE — Progress Notes (Signed)
[facility] - PHARMACIST COUNSELING NOTE  ADHERENCE ASSESSMENT  Adherence strategy:    Do you ever forget to take your medication? [] Yes (1) [x] No (0)  Do you ever skip doses due to side effects? [] Yes (1) [x] No (0)  Do you have trouble affording your medicines? [] Yes (1) [] No (0)  Are you ever unable to pick up your medication due to transportation difficulties? [] Yes (1) [] No (0)  Do you ever stop taking your medications because you don't believe they are helping? [] Yes (1) [x] No (0)  Total score _3______    Recommendations given to patient about increasing adherence: Patient reports that he very compliant with his medications given he is a dialysis patient.   Guideline-Directed Medical Therapy/Evidence Based Medicine    ACE/ARB/ARNI: N/A    Beta Blocker: Metoprolol   Aldosterone Antagonist: N/A Diuretic: Lasix (inpatient)    SUBJECTIVE   HPI: . Acute hypoxic respiratory failure with acute on chronic diastolic heart failure. PMH significant for end-stage renal disease, dialysis TThS.  Past Medical History:  Diagnosis Date  . Barrett's esophagus   . Chronic kidney disease    CHRONIC\  . Glomerulosclerosis, focal   . Hyperparathyroidism due to renal insufficiency ([place])         OBJECTIVE    Vital signs: HR 67, BP 124-67, weight (pounds) 152 (no changes in weight from previous day)  ECHO: Date 8/31, EF 60-65%  BMP Latest Ref Rng & Units 04/09/2019 04/09/2019 03/20/2016  Glucose 70 - 99 mg/dL 124(H) 106(H) 91  BUN 8 - 23 mg/dL 45(H) 41(H) 23(H)  Creatinine 0.61 - 1.24 mg/dL 8.16(H) 7.70(H) 6.28(H)  Sodium 135 - 145 mmol/L 140 141 136  Potassium 3.5 - 5.1 mmol/L 4.6 4.4 4.4  Chloride 98 - 111 mmol/L 98 100 96(L)  CO2 22 - 32 mmol/L 27 30 28   Calcium 8.9 - 10.3 mg/dL 8.5(L) 8.8(L) 8.6(L)    ASSESSMENT Patient was somewhat unpleasant during the encounter as I was rushed to get through the counseling. Briefly discussed  diet, medication adherence, self-monitoring blood pressure, and daily weights. Patient reports that he frequently monitors his blood pressure and weight since he receives dialysis. He also states that he is aware of dietary restrictions but does like to indulge on occassion.  When quizzed on CHF related questions, patient became frustrated.    PLAN 1. May consider life style modifications.  2. May consider loop diuretic in the outpatient setting, if clinically appropriate.     Time spent: 10 minutes  [place], Pharm.D. Clinical Pharmacist 04/10/2019 2:11 PM   MEDICATION ADHERENCES TIPS AND STRATEGIES 1. Taking medication as prescribed improves patient outcomes in heart failure (reduces hospitalizations, improves symptoms, increases survival) 2. Side effects of medications can be managed by decreasing doses, switching agents, stopping drugs, or adding additional therapy. Please let someone in the [facility] know if you have having bothersome side effects so we can modify your regimen. Do not alter your medication regimen without talking to [name].  3. Medication reminders can help patients remember to take drugs on time. If you are missing or forgetting doses you can try linking behaviors, using pill boxes, or an electronic reminder like an alarm on your phone or an app. Some people can also get automated phone calls as medication reminders.

## 2019-04-10 NOTE — Plan of Care (Signed)
  Problem: Education: Goal: Knowledge of disease and its progression will improve Outcome: Progressing   Problem: Education: Goal: Knowledge of General Education information will improve Description: Including pain rating scale, medication(s)/side effects and non-pharmacologic comfort measures Outcome: Progressing   Problem: Clinical Measurements: Goal: Respiratory complications will improve Outcome: Progressing Goal: Cardiovascular complication will be avoided Outcome: Progressing   Problem: Safety: Goal: Ability to remain free from injury will improve Outcome: Progressing   Problem: Skin Integrity: Goal: Risk for impaired skin integrity will decrease Outcome: Progressing

## 2019-04-10 NOTE — TOC Initial Note (Signed)
Transition of Care [facility]) - Initial/Assessment Note    Patient Details  Name: Scott Castro MRN: [id number] Date of Birth: 1955/12/08  [facility]) CM/SW Contact:    Ross Ludwig, LCSW Phone Number: 04/10/2019, 2:43 PM  Clinical Narrative:                  Patient is a 63 year old male who is alert and oriented x4.  Patient states that he lives alone, and does not have any family nearby.  Patient works for [employer] and is off for the next week.. Patient states he plans to return back home, and he does not want to have to be on oxygen for long term.  Patient states he has had to be on oxygen in the past for short term.  Patient is hopeful that he can return back home safely.  Pt is not recommending any follow up.  Expected Discharge Plan: Home/Self Care Barriers to Discharge: Barriers Resolved   Patient Goals and CMS Choice Patient states their goals for this hospitalization and ongoing recovery are:: To return back home. CMS Medicare.gov Compare Post Acute Care list provided to:: Patient Choice offered to / list presented to : Patient  Expected Discharge Plan and Services Expected Discharge Plan: Home/Self Care In-house Referral: Clinical Social Work   Post Acute Care Choice: NA Living arrangements for the past 2 months: Single Family Home Expected Discharge Date: 04/10/19                         [facility] Arranged: NA          Prior Living Arrangements/Services Living arrangements for the past 2 months: Single Family Home Lives with:: Self Patient language and need for interpreter reviewed:: Yes Do you feel safe going back to the place where you live?: Yes      Need for Family Participation in Patient Care: No (Comment) Care giver support system in place?: No (comment)      Activities of Daily Living Home Assistive Devices/Equipment: None ADL Screening (condition at time of admission) Patient's cognitive ability adequate to safely complete daily  activities?: Yes Is the patient deaf or have difficulty hearing?: No Does the patient have difficulty seeing, even when wearing glasses/contacts?: No Does the patient have difficulty concentrating, remembering, or making decisions?: No Patient able to express need for assistance with ADLs?: Yes Does the patient have difficulty dressing or bathing?: No Independently performs ADLs?: Yes (appropriate for developmental age) Does the patient have difficulty walking or climbing stairs?: No Weakness of Legs: None Weakness of Arms/Hands: None  Permission Sought/Granted                  Emotional Assessment Appearance:: Appears older than stated age Attitude/Demeanor/Rapport: Engaged Affect (typically observed): Accepting, Stable Orientation: : Oriented to Self, Oriented to Place, Oriented to  Time, Oriented to Situation Alcohol / Substance Use: Not Applicable Psych Involvement: No (comment)  Admission diagnosis:  Acute pulmonary edema (HCC) [J81.0] Hypoxia [R09.02] COPD exacerbation (HCC) [J44.1] ESRD (end stage renal disease) on dialysis ([place]) [N18.6, Z99.2] Patient Active Problem List   Diagnosis Date Noted  . COPD (chronic obstructive pulmonary disease) ([place]) 04/10/2019  . COPD exacerbation ([place]) 04/09/2019   PCP:  Franciso Bend, MD Pharmacy:  No Pharmacies Listed    Social Determinants of Health (SDOH) Interventions    Readmission Risk Interventions No flowsheet data found.

## 2019-04-10 NOTE — TOC Transition Note (Addendum)
Transition of Care [facility]) - CM/SW Discharge Note   Patient Details  Name: Scott Castro MRN: [id number] Date of Birth: 1955/09/23  Transition [facility] CM/SW Contact:  Ross Ludwig, LCSW Phone Number: 04/10/2019, 2:52 PM   Clinical Narrative:     Patient will be discharging to home where he lives alone.  Patient is hopeful he will feel better before having to return to work next work.   Final next level of care: Home/Self Care Barriers to Discharge: Barriers Resolved   Patient Goals and CMS Choice Patient states their goals for this hospitalization and ongoing recovery are:: To return back home. CMS Medicare.gov Compare Post Acute Care list provided to:: Patient Choice offered to / list presented to : Patient  Discharge Placement    Patient to discharge back home.                     Discharge Plan and Services In-house Referral: Clinical Social Work   Post Acute Care Choice: NA                    HH Arranged: NA          Social Determinants of Health (SDOH) Interventions     Readmission Risk Interventions Readmission Risk Prevention Plan 04/10/2019  Transportation Screening Complete  PCP or Specialist Appt within 3-5 Days Complete  HRI or [place] Complete  Social Work Consult for [place] Planning/Counseling Complete  Medication Review [employer] photographer) Complete  Some recent data might be hidden

## 2019-04-10 NOTE — Progress Notes (Signed)
[facility] Encounter Note  Patient: Scott Castro / Admit Date: 04/09/2019 / Date of Encounter: 04/10/2019, 8:50 AM   Subjective: Patient feeling much better today.  Less shortness of breath overnight.  Patient tolerated dialysis very well.  Minimal elevation of troponin most consistent with demand ischemia.  Acute on chronic diastolic dysfunction heart failure most consistent with multiple factors including COPD kidney dysfunction and anemia now improved.  Telemetry shows normal sinus rhythm. Echocardiogram showing normal LV systolic function with minimal valvular heart disease with ejection fraction of 65%  Review of Systems: Positive for: Redness of breath Negative for: Vision change, hearing change, syncope, dizziness, nausea, vomiting,diarrhea, bloody stool, stomach pain, cough, congestion, diaphoresis, urinary frequency, urinary pain,skin lesions, skin rashes Others previously listed  Objective: Telemetry: Normal sinus rhythm Physical Exam: Blood pressure (!) 128/58, pulse 63, temperature 98 F (36.7 C), temperature source Oral, resp. rate 18, height 5\' 9"  (1.753 m), weight 69 kg, SpO2 97 %. Body mass index is 22.46 kg/m. General: Well developed, well nourished, in no acute distress. Head: Normocephalic, atraumatic, sclera non-icteric, no xanthomas, nares are without discharge. Neck: No apparent masses Lungs: Normal respirations with few wheezes, some rhonchi, no rales , no crackles   Heart: Regular rate and rhythm, normal S1 S2, no murmur, no rub, no gallop, PMI is normal size and placement, carotid upstroke normal without bruit, jugular venous pressure normal Abdomen: Soft, non-tender, non-distended with normoactive bowel sounds. No hepatosplenomegaly. Abdominal aorta is normal size without bruit Extremities: No edema, no clubbing, no cyanosis, no ulcers,  Peripheral: 2+ radial, 2+ femoral, 2+ dorsal pedal pulses Neuro: Alert and oriented. Moves all extremities  spontaneously. Psych:  Responds to questions appropriately with a normal affect.   Intake/Output Summary (Last 24 hours) at 04/10/2019 0850 Last data filed at 04/10/2019 0020 Gross per 24 hour  Intake 720 ml  Output 2000 ml  Net -1280 ml    Inpatient Medications:  . aspirin EC  81 mg Oral Daily  . Chlorhexidine Gluconate Cloth  6 each Topical Q0600  . cinacalcet  60 mg Oral Q breakfast  . cloNIDine  0.3 mg Oral TID  . epoetin (EPOGEN/PROCRIT) injection  4,000 Units Intravenous Q T,Th,Sa-HD  . feeding supplement (NEPRO CARB STEADY)  237 mL Oral BID BM  . furosemide  60 mg Intravenous Q12H  . guaiFENesin  600 mg Oral BID  . heparin  5,000 Units Subcutaneous Q8H  . ipratropium-albuterol  3 mL Nebulization QID  . mouth rinse  15 mL Mouth Rinse BID  . methylPREDNISolone (SOLU-MEDROL) injection  60 mg Intravenous Q8H  . metoprolol tartrate  100 mg Oral BID  . minoxidil  2.5 mg Oral BID  . multivitamin  1 tablet Oral Daily  . pantoprazole  40 mg Oral Daily  . pneumococcal 23 valent vaccine  0.5 mL Intramuscular Tomorrow-1000  . pravastatin  20 mg Oral Daily  . sevelamer carbonate  800 mg Oral TID WC  . sodium chloride flush  3 mL Intravenous Q12H   Infusions:  . sodium chloride      Labs: Recent Labs    04/09/19 0155 04/09/19 0822  NA 141 140  K 4.4 4.6  CL 100 98  CO2 30 27  GLUCOSE 106* 124*  BUN 41* 45*  CREATININE 7.70* 8.16*  CALCIUM 8.8* 8.5*  PHOS  --  4.6   Recent Labs    04/09/19 0155  AST 10*  ALT 9  ALKPHOS 77  BILITOT 0.6  PROT  7.6  ALBUMIN 3.8   Recent Labs    04/09/19 0155 04/09/19 0822  WBC 14.0* 6.7  NEUTROABS 11.9*  --   HGB 10.2* 9.8*  HCT 32.2* 30.3*  MCV 99.7 98.1  PLT 213 206   No results for input(s): CKTOTAL, CKMB, TROPONINI in the last 72 hours. Invalid input(s): POCBNP No results for input(s): HGBA1C in the last 72 hours.   Weights: Filed Weights   04/09/19 1300 04/09/19 1715 04/10/19 0416  Weight: 71.5 kg 68 kg 69 kg      Radiology/Studies:  Dg Chest Port 1 View  Result Date: 04/09/2019 CLINICAL DATA:  Initial evaluation for acute shortness of breath. EXAM: PORTABLE CHEST 1 VIEW COMPARISON:  Prior radiograph from 03/11/2017. FINDINGS: Cardiomegaly, stable. Mediastinal silhouette within normal limits. Aortic atherosclerosis. Mild chronic elevation of the right hemidiaphragm with associated right basilar scarring. Diffuse pulmonary vascular congestion with right greater than left airspace opacity, favored to reflect edema and/or atelectasis, although infiltrates not excluded. Superimposed small right with moderate left pleural effusions. No pneumothorax. No acute osseous finding. Vascular stent overlies the left axilla. IMPRESSION: 1. Cardiomegaly with diffuse pulmonary vascular congestion and right greater than left airspace opacity. Findings favored to reflect pulmonary edema with superimposed bibasilar atelectatic changes. Possible infiltrates would be difficult to exclude, and could be considered in the correct clinical setting. 2. Superimposed small right with moderate left pleural effusions. 3. Aortic atherosclerosis. Electronically Signed   By: Jeannine Boga M.D.   On: 04/09/2019 02:08     Assessment and Recommendation  63 y.o. male with known chronic end-stage renal disease hypertension hyperlipidemia anemia with acute on chronic diastolic dysfunction congestive heart failure secondary to above improved after dialysis and appropriate medication management changes without evidence of myocardial infarction.  Elevation of troponin most consistent with demand ischemia rather than acute coronary syndrome 1.  Continue dialysis and treatment of chronic since kidney disease 2.  Continue antihypertensive medication management as before without change today improved 3.  No further cardiac diagnostics necessary at this time 4.  Begin ambulation and follow for improvements of symptoms and possible discharged home  if patient ambulating well 5.  No further cardiac intervention of minimal elevation of troponin most consistent with demand ischemia 6.  Call if further questions  Signed, Serafina Royals M.D. FACC

## 2019-04-10 NOTE — Progress Notes (Signed)
[facility]  ROUNDING NOTE   Subjective:  Patient states her breathing is much improved. Seen and evaluated during another dialysis treatment today. Off of oxygen treatment.   Objective:  Vital signs in last 24 hours:  Temp:  [97.9 F (36.6 C)-98.5 F (36.9 C)] 98 F (36.7 C) (09/01 0910) Pulse Rate:  [56-86] 56 (09/01 1200) Resp:  [14-25] 25 (09/01 1200) BP: (105-207)/(46-85) 118/46 (09/01 1200) SpO2:  [94 %-100 %] 94 % (09/01 1130) Weight:  [68 kg-71.5 kg] 69 kg (09/01 0910)  Weight change: 1.5 kg Filed Weights   04/09/19 1715 04/10/19 0416 04/10/19 0910  Weight: 68 kg 69 kg 69 kg    Intake/Output: I/O last 3 completed shifts: In: 35 [P.O.:720] Out: 2000 [Other:2000]   Intake/Output this shift:  Total I/O In: 240 [P.O.:240] Out: -   Physical Exam: General: No acute distress  Head: Normocephalic, atraumatic. Moist oral mucosal membranes  Eyes: Anicteric  Neck: Supple, trachea midline  Lungs:  Minimal basilar rales, normal effort  Heart: S1S2 no rubs  Abdomen:  Soft, nontender, bowel sounds present  Extremities: No peripheral edema.  Neurologic: Awake, alert, following commands  Skin: No lesions  Access: LUE AVF    Basic Metabolic Panel: Recent Labs  Lab 04/09/19 0155 04/09/19 0822  NA 141 140  K 4.4 4.6  CL 100 98  CO2 30 27  GLUCOSE 106* 124*  BUN 41* 45*  CREATININE 7.70* 8.16*  CALCIUM 8.8* 8.5*  PHOS  --  4.6    Liver Function Tests: Recent Labs  Lab 04/09/19 0155  AST 10*  ALT 9  ALKPHOS 77  BILITOT 0.6  PROT 7.6  ALBUMIN 3.8   No results for input(s): LIPASE, AMYLASE in the last 168 hours. No results for input(s): AMMONIA in the last 168 hours.  CBC: Recent Labs  Lab 04/09/19 0155 04/09/19 0822  WBC 14.0* 6.7  NEUTROABS 11.9*  --   HGB 10.2* 9.8*  HCT 32.2* 30.3*  MCV 99.7 98.1  PLT 213 206    Cardiac Enzymes: No results for input(s): CKTOTAL, CKMB, CKMBINDEX, TROPONINI in the last 168  hours.  BNP: Invalid input(s): POCBNP  CBG: No results for input(s): GLUCAP in the last 168 hours.  Microbiology: Results for orders placed or performed during the hospital encounter of 04/09/19  SARS Coronavirus 2 [facility] order, Performed in [facility] hospital lab) Nasopharyngeal Nasopharyngeal Swab     Status: None   Collection Time: 04/09/19  1:55 AM   Specimen: Nasopharyngeal Swab  Result Value Ref Range Status   SARS Coronavirus 2 NEGATIVE NEGATIVE Final    Comment: (NOTE) If result is NEGATIVE SARS-CoV-2 target nucleic acids are NOT DETECTED. The SARS-CoV-2 RNA is generally detectable in upper and lower  respiratory specimens during the acute phase of infection. The lowest  concentration of SARS-CoV-2 viral copies this assay can detect is 250  copies / mL. A negative result does not preclude SARS-CoV-2 infection  and should not be used as the sole basis for treatment or other  patient management decisions.  A negative result may occur with  improper specimen collection / handling, submission of specimen other  than nasopharyngeal swab, presence of viral mutation(s) within the  areas targeted by this assay, and inadequate number of viral copies  (<250 copies / mL). A negative result must be combined with clinical  observations, patient history, and epidemiological information. If result is POSITIVE SARS-CoV-2 target nucleic acids are DETECTED. The SARS-CoV-2 RNA is generally detectable in  upper and lower  respiratory specimens dur ing the acute phase of infection.  Positive  results are indicative of active infection with SARS-CoV-2.  Clinical  correlation with patient history and other diagnostic information is  necessary to determine patient infection status.  Positive results do  not rule out bacterial infection or co-infection with other viruses. If result is PRESUMPTIVE POSTIVE SARS-CoV-2 nucleic acids MAY BE PRESENT.   A presumptive positive result was obtained  on the submitted specimen  and confirmed on repeat testing.  While 2019 novel coronavirus  (SARS-CoV-2) nucleic acids may be present in the submitted sample  additional confirmatory testing may be necessary for epidemiological  and / or clinical management purposes  to differentiate between  SARS-CoV-2 and other Sarbecovirus currently known to infect humans.  If clinically indicated additional testing with an alternate test  methodology [phone]) is advised. The SARS-CoV-2 RNA is generally  detectable in upper and lower respiratory sp ecimens during the acute  phase of infection. The expected result is Negative. Fact Sheet for Patients:  StrictlyIdeas.no Fact Sheet for Healthcare Providers: BankingDealers.co.za This test is not yet approved or cleared by the [place] FDA and has been authorized for detection and/or diagnosis of SARS-CoV-2 by FDA under an Emergency Use Authorization (EUA).  This EUA will remain in effect (meaning this test can be used) for the duration of the COVID-19 declaration under Section 564(b)(1) of the Act, 21 U.S.C. section 360bbb-3(b)(1), unless the authorization is terminated or revoked sooner. Performed at [facility], [address] [place], [place] [address]   Culture, blood (routine x 2)     Status: None (Preliminary result)   Collection Time: 04/09/19  2:41 AM   Specimen: BLOOD  Result Value Ref Range Status   Specimen Description BLOOD RIGHT HAND  Final   Special Requests   Final    BOTTLES DRAWN AEROBIC AND ANAEROBIC Blood Culture adequate volume   Culture   Final    NO GROWTH 1 DAY Performed at [facility], [address] [place], [place] [address]    Report Status PENDING  Incomplete  Culture, blood (routine x 2)     Status: None (Preliminary result)   Collection Time: 04/09/19  8:22 AM   Specimen: BLOOD  Result Value Ref Range Status   Specimen Description BLOOD  RIGHT AC  Final   Special Requests   Final    BOTTLES DRAWN AEROBIC AND ANAEROBIC Blood Culture adequate volume   Culture   Final    NO GROWTH < 24 HOURS Performed at [facility], [address], [place], [place] [address]    Report Status PENDING  Incomplete  MRSA PCR Screening     Status: None   Collection Time: 04/09/19 12:25 PM   Specimen: Nasal Mucosa; Nasopharyngeal  Result Value Ref Range Status   MRSA by PCR NEGATIVE NEGATIVE Final    Comment:        The GeneXpert MRSA Assay (FDA approved for NASAL specimens only), is one component of a comprehensive MRSA colonization surveillance program. It is not intended to diagnose MRSA infection nor to guide or monitor treatment for MRSA infections. Performed at [facility], [address] [place], [place] [address]     Coagulation Studies: No results for input(s): LABPROT, INR in the last 72 hours.  Urinalysis: No results for input(s): COLORURINE, LABSPEC, PHURINE, GLUCOSEU, HGBUR, BILIRUBINUR, KETONESUR, PROTEINUR, UROBILINOGEN, NITRITE, LEUKOCYTESUR in the last 72 hours.  Invalid input(s): APPERANCEUR  Imaging: Dg Chest Port 1 View  Result Date: 04/09/2019 CLINICAL DATA:  Initial evaluation for acute shortness of breath. EXAM: PORTABLE CHEST 1 VIEW COMPARISON:  Prior radiograph from 03/11/2017. FINDINGS: Cardiomegaly, stable. Mediastinal silhouette within normal limits. Aortic atherosclerosis. Mild chronic elevation of the right hemidiaphragm with associated right basilar scarring. Diffuse pulmonary vascular congestion with right greater than left airspace opacity, favored to reflect edema and/or atelectasis, although infiltrates not excluded. Superimposed small right with moderate left pleural effusions. No pneumothorax. No acute osseous finding. Vascular stent overlies the left axilla. IMPRESSION: 1. Cardiomegaly with diffuse pulmonary vascular congestion and right greater than left airspace opacity.  Findings favored to reflect pulmonary edema with superimposed bibasilar atelectatic changes. Possible infiltrates would be difficult to exclude, and could be considered in the correct clinical setting. 2. Superimposed small right with moderate left pleural effusions. 3. Aortic atherosclerosis. Electronically Signed   By: Jeannine Boga M.D.   On: 04/09/2019 02:08     Medications:   . sodium chloride     . aspirin EC  81 mg Oral Daily  . Chlorhexidine Gluconate Cloth  6 each Topical Q0600  . cinacalcet  60 mg Oral Q breakfast  . cloNIDine  0.3 mg Oral TID  . epoetin (EPOGEN/PROCRIT) injection  4,000 Units Intravenous Q T,Th,Sa-HD  . feeding supplement (NEPRO CARB STEADY)  237 mL Oral BID BM  . furosemide  60 mg Intravenous Q12H  . guaiFENesin  600 mg Oral BID  . heparin  5,000 Units Subcutaneous Q8H  . ipratropium-albuterol  3 mL Nebulization TID  . mouth rinse  15 mL Mouth Rinse BID  . methylPREDNISolone (SOLU-MEDROL) injection  60 mg Intravenous Q8H  . metoprolol tartrate  100 mg Oral BID  . minoxidil  2.5 mg Oral BID  . multivitamin  1 tablet Oral Daily  . pantoprazole  40 mg Oral Daily  . pneumococcal 23 valent vaccine  0.5 mL Intramuscular Tomorrow-1000  . pravastatin  20 mg Oral Daily  . sevelamer carbonate  800 mg Oral TID WC  . sodium chloride flush  3 mL Intravenous Q12H   sodium chloride, acetaminophen **OR** acetaminophen, chlorpheniramine-HYDROcodone, hydrALAZINE, magnesium hydroxide, [DISCONTINUED] ondansetron **OR** ondansetron (ZOFRAN) IV, ondansetron, sodium chloride flush, traZODone  Assessment/ Plan:  63 y.o. male with a PMHx of ESRD on HD TTS, history of multiple lung nodules, history of FSGS, anemia chronic kidney disease, secondary hyperparathyroidism, Barrett's esophagus,, history of renal transplantation in the past who was admitted to [facility] on 04/09/2019 for evaluation of increasing shortness of breath.   [facility]/[address]/TTHS/EDW 68.5  1.   ESRD on HD TTS.    Patient seen and evaluated during dialysis treatment.  Respiratory status much improved with 2 days of successive dialysis.  We plan to complete dialysis treatment today.  2.  Pulmonary edema.  Noted on chest x-ray.    Clinically improved.  Continue ultrafiltration with dialysis treatment today.  3.  Anemia of chronic kidney disease.  Hemoglobin 9.8.  Start the patient on Epogen 4000 and IV with dialysis.  4.  Secondary hyperparathyroidism.  Continue renvela 800mg  po tid/wm.    LOS: 1 Haydn Hutsell 9/1/202012:15 PM '

## 2019-04-10 NOTE — Progress Notes (Signed)
PT Cancellation Note  Patient Details Name: Scott Castro MRN: [id number] DOB: [birth year]   Cancelled Treatment:    Reason Eval/Treat Not Completed: Patient at procedure or test/unavailable.  PT began gathering social hx while pt finished breakfast and RN entered room to prep pt to leave for dialysis.  Pt stated he thinks he will feel up to participating with therapy after dialysis.  Pt is generally active throughout the day and says that he "works but doesn't do any exercise besides the Bowflex".  Pt does not use an AD and states that he was able to walk longer distances for exercise up until 2-3 months ago.  He thought this was because he was just "out of shape" but now knows that his situation is "more complex".  Pt also has Plantar fasciitis which limits his mobility.  He has tried orthotic inserts which helped minimally.  Will check back in with pt after lunch.  Roxanne Gates, PT, DPT  Roxanne Gates 04/10/2019, 8:50 AM

## 2019-04-10 NOTE — Discharge Summary (Signed)
[facility] NAME: Scott Castro    MR#:  [id number]  DATE OF BIRTH:  [birth year]  DATE OF ADMISSION:  04/09/2019 ADMITTING PHYSICIAN: Christel Mormon, MD  DATE OF DISCHARGE: 04/10/2019  PRIMARY CARE PHYSICIAN: Franciso Bend, MD    ADMISSION DIAGNOSIS:  Acute pulmonary edema (HCC) [J81.0] Hypoxia [R09.02] COPD exacerbation (HCC) [J44.1] ESRD (end stage renal disease) on dialysis ([place]) [N18.6, Z99.2]  DISCHARGE DIAGNOSIS:  Active Problems: Acute hypoxic respiratory failure  SECONDARY DIAGNOSIS:   Past Medical History:  Diagnosis Date  . Barrett's esophagus   . Chronic kidney disease    CHRONIC\  . Glomerulosclerosis, focal   . Hyperparathyroidism due to renal insufficiency [facility])     HOSPITAL COURSE:   63 year old male with end-stage renal disease on hemodialysis who presented to the emergency room due to shortness of breath.  1. Acute hypoxic respiratory failure with acute on chronic diastolic heart failure: This is due to fluid overload from underlying end-stage renal disease.  Patient has been weaned off of oxygen.  2.  End-stage renal disease: Chest x-ray was consistent with fluid overload.  Fluid overload is due to underlying end-stage renal disease.  Patient had back to back dialysis.  His symptoms have improved.  Echocardiogram shows normal ejection fraction with diastolic heart failure and preserved ejection fraction.  3.  Acute on chronic diastolic heart failure due to underlying end-stage renal disease: Patient symptoms have improved.   4.  Essential hypertension: Continue clonidine, enalapril, metoprolol, minoxidil  5.  Hyperlipidemia: Continue statin DISCHARGE CONDITIONS AND DIET:   Stable for discharge renal diet  CONSULTS OBTAINED:    DRUG ALLERGIES:  No Known Allergies  DISCHARGE MEDICATIONS:   Allergies as of 04/10/2019   No Known Allergies     Medication List    TAKE these medications   aspirin 81  MG tablet Take 81 mg by mouth daily.   cinacalcet 30 MG tablet Commonly known as: SENSIPAR Take 60 mg by mouth daily.   cloNIDine 0.1 MG tablet Commonly known as: CATAPRES Take 0.1 mg by mouth 2 (two) times daily.   cloNIDine 0.3 MG tablet Commonly known as: CATAPRES Take 0.3 mg by mouth 3 (three) times daily.   enalapril 20 MG tablet Commonly known as: VASOTEC Take 20 mg by mouth daily.   esomeprazole 40 MG capsule Commonly known as: NEXIUM Take 40 mg by mouth daily at 12 noon.   fexofenadine 180 MG tablet Commonly known as: ALLEGRA Take 180 mg by mouth daily.   levETIRAcetam 500 MG tablet Commonly known as: KEPPRA Take 500 mg by mouth 3 (three) times a week.   levETIRAcetam 1000 MG tablet Commonly known as: KEPPRA Take 1,000 mg by mouth 2 (two) times daily.   metoprolol tartrate 100 MG tablet Commonly known as: LOPRESSOR Take 100 mg by mouth 2 (two) times daily.   metoprolol tartrate 50 MG tablet Commonly known as: LOPRESSOR Take 50 mg by mouth 2 (two) times daily.   minoxidil 2.5 MG tablet Commonly known as: LONITEN Take by mouth 2 (two) times daily.   multivitamin Tabs tablet Take 1 tablet by mouth daily.   ondansetron 8 MG tablet Commonly known as: ZOFRAN Take 8 mg by mouth every 8 (eight) hours as needed for nausea or vomiting.   pantoprazole 40 MG tablet Commonly known as: PROTONIX Take 40 mg by mouth daily.   pravastatin 20 MG tablet Commonly known as: PRAVACHOL Take 20 mg by mouth  daily.   sevelamer carbonate 800 MG tablet Commonly known as: RENVELA Take 800 mg by mouth 3 (three) times daily with meals.         Today   CHIEF COMPLAINT:  Patient doing well this morning.  Patient seen in dialysis.  Reports no shortness of breath.   VITAL SIGNS:  Blood pressure (!) 121/54, pulse 75, temperature 98 F (36.7 C), temperature source Oral, resp. rate 19, height 5\' 9"  (1.753 m), weight 69 kg, SpO2 98 %.   REVIEW OF SYSTEMS:  Review  of Systems  Constitutional: Negative.  Negative for chills, fever and malaise/fatigue.  HENT: Negative.  Negative for ear discharge, ear pain, hearing loss, nosebleeds and sore throat.   Eyes: Negative.  Negative for blurred vision and pain.  Respiratory: Negative.  Negative for cough, hemoptysis, shortness of breath and wheezing.   Cardiovascular: Negative.  Negative for chest pain, palpitations and leg swelling.  Gastrointestinal: Negative.  Negative for abdominal pain, blood in stool, diarrhea, nausea and vomiting.  Genitourinary: Negative.  Negative for dysuria.  Musculoskeletal: Negative.  Negative for back pain.  Skin: Negative.   Neurological: Negative for dizziness, tremors, speech change, focal weakness, seizures and headaches.  Endo/Heme/Allergies: Negative.  Does not bruise/bleed easily.  Psychiatric/Behavioral: Negative.  Negative for depression, hallucinations and suicidal ideas.     PHYSICAL EXAMINATION:  GENERAL:  63 y.o.-year-old patient lying in the bed with no acute distress.  NECK:  Supple, no jugular venous distention. No thyroid enlargement, no tenderness.  LUNGS: Normal breath sounds bilaterally, no wheezing, rales,rhonchi  No use of accessory muscles of respiration.  CARDIOVASCULAR: S1, S2 normal. No murmurs, rubs, or gallops.  ABDOMEN: Soft, non-tender, non-distended. Bowel sounds present. No organomegaly or mass.  EXTREMITIES: No pedal edema, cyanosis, or clubbing.  PSYCHIATRIC: The patient is alert and oriented x 3.  SKIN: No obvious rash, lesion, or ulcer.   DATA REVIEW:   CBC Recent Labs  Lab 04/09/19 0822  WBC 6.7  HGB 9.8*  HCT 30.3*  PLT 206    Chemistries  Recent Labs  Lab 04/09/19 0155 04/09/19 0822  NA 141 140  K 4.4 4.6  CL 100 98  CO2 30 27  GLUCOSE 106* 124*  BUN 41* 45*  CREATININE 7.70* 8.16*  CALCIUM 8.8* 8.5*  AST 10*  --   ALT 9  --   ALKPHOS 77  --   BILITOT 0.6  --     Cardiac Enzymes No results for input(s):  TROPONINI in the last 168 hours.  Microbiology Results  @MICRORSLT48 @  RADIOLOGY:  Dg Chest Port 1 View  Result Date: 04/09/2019 CLINICAL DATA:  Initial evaluation for acute shortness of breath. EXAM: PORTABLE CHEST 1 VIEW COMPARISON:  Prior radiograph from 03/11/2017. FINDINGS: Cardiomegaly, stable. Mediastinal silhouette within normal limits. Aortic atherosclerosis. Mild chronic elevation of the right hemidiaphragm with associated right basilar scarring. Diffuse pulmonary vascular congestion with right greater than left airspace opacity, favored to reflect edema and/or atelectasis, although infiltrates not excluded. Superimposed small right with moderate left pleural effusions. No pneumothorax. No acute osseous finding. Vascular stent overlies the left axilla. IMPRESSION: 1. Cardiomegaly with diffuse pulmonary vascular congestion and right greater than left airspace opacity. Findings favored to reflect pulmonary edema with superimposed bibasilar atelectatic changes. Possible infiltrates would be difficult to exclude, and could be considered in the correct clinical setting. 2. Superimposed small right with moderate left pleural effusions. 3. Aortic atherosclerosis. Electronically Signed   By: Jeannine Boga M.D.   On:  04/09/2019 02:08      Allergies as of 04/10/2019   No Known Allergies     Medication List    TAKE these medications   aspirin 81 MG tablet Take 81 mg by mouth daily.   cinacalcet 30 MG tablet Commonly known as: SENSIPAR Take 60 mg by mouth daily.   cloNIDine 0.1 MG tablet Commonly known as: CATAPRES Take 0.1 mg by mouth 2 (two) times daily.   cloNIDine 0.3 MG tablet Commonly known as: CATAPRES Take 0.3 mg by mouth 3 (three) times daily.   enalapril 20 MG tablet Commonly known as: VASOTEC Take 20 mg by mouth daily.   esomeprazole 40 MG capsule Commonly known as: NEXIUM Take 40 mg by mouth daily at 12 noon.   fexofenadine 180 MG tablet Commonly known as:  ALLEGRA Take 180 mg by mouth daily.   levETIRAcetam 500 MG tablet Commonly known as: KEPPRA Take 500 mg by mouth 3 (three) times a week.   levETIRAcetam 1000 MG tablet Commonly known as: KEPPRA Take 1,000 mg by mouth 2 (two) times daily.   metoprolol tartrate 100 MG tablet Commonly known as: LOPRESSOR Take 100 mg by mouth 2 (two) times daily.   metoprolol tartrate 50 MG tablet Commonly known as: LOPRESSOR Take 50 mg by mouth 2 (two) times daily.   minoxidil 2.5 MG tablet Commonly known as: LONITEN Take by mouth 2 (two) times daily.   multivitamin Tabs tablet Take 1 tablet by mouth daily.   ondansetron 8 MG tablet Commonly known as: ZOFRAN Take 8 mg by mouth every 8 (eight) hours as needed for nausea or vomiting.   pantoprazole 40 MG tablet Commonly known as: PROTONIX Take 40 mg by mouth daily.   pravastatin 20 MG tablet Commonly known as: PRAVACHOL Take 20 mg by mouth daily.   sevelamer carbonate 800 MG tablet Commonly known as: RENVELA Take 800 mg by mouth 3 (three) times daily with meals.          Management plans discussed with the patient and he is in agreement. Stable for discharge home  Patient should follow up with pcp  CODE STATUS:     Code Status Orders  (From admission, onward)         Start     Ordered   04/09/19 0348  Full code  Continuous     04/09/19 0353        Code Status History    This patient has a current code status but no historical code status.   Advance Care Planning Activity    Advance Directive Documentation     Most Recent Value  Type of Advance Directive  Healthcare Power of Attorney  Pre-existing out of facility DNR order (yellow form or pink MOST form)  -  "MOST" Form in Place?  -      TOTAL TIME TAKING CARE OF THIS PATIENT: 38 minutes.    Note: This dictation was prepared with Dragon dictation along with smaller phrase technology. Any transcriptional errors that result from this process are  unintentional.  Bettey Costa M.D on 04/10/2019 at 10:46 AM  Between 7am to 6pm - Pager - [phone] After 6pm go to [email or web address] - password [email or web address] [facility]  Office  [phone]  CC: Primary care physician; Franciso Bend, MD

## 2019-04-10 NOTE — Progress Notes (Signed)
Hemodialysis completed without issue. UF goal 2L met. Patient tolerated well. Sats 98% on room air. No distress noted. Report called to primary RN.

## 2019-04-10 NOTE — Care Management Obs Status (Deleted)
[place] NOTIFICATION   Patient Details  Name: Scott Castro MRN: [id number] Date of Birth: 1956/08/01   Medicare Observation Status Notification Given:       [name] 04/10/2019, 2:29 PM

## 2019-04-10 NOTE — Progress Notes (Signed)
OT Cancellation Note  Patient Details Name: OLE LAFON MRN: [id number] DOB: 05/13/1956   Cancelled Treatment:    Reason Eval/Treat Not Completed: Patient at procedure or test/ unavailable. Pt out for dialysis. Will re-attempt OT evaluation at later date/time as pt is available and medically appropriate.   Jeni Salles, MPH, MS, OTR/L ascom [phone] 04/10/19, 9:15 AM

## 2019-04-10 NOTE — Care Management Obs Status (Signed)
[place] NOTIFICATION   Patient Details  Name: Scott Castro MRN: [id number] Date of Birth: 1956/01/18   Medicare Observation Status Notification Given:  Yes    Ross Ludwig, LCSW 04/10/2019, 2:30 PM

## 2019-04-14 LAB — CULTURE, BLOOD (ROUTINE X 2)
Culture: NO GROWTH
Culture: NO GROWTH
Special Requests: ADEQUATE
Special Requests: ADEQUATE

## 2019-09-17 ENCOUNTER — Emergency Department: Payer: Medicare Other

## 2019-09-17 ENCOUNTER — Other Ambulatory Visit: Payer: Self-pay

## 2019-09-17 ENCOUNTER — Inpatient Hospital Stay
Admission: EM | Admit: 2019-09-17 | Discharge: 2019-09-19 | DRG: 640 | Disposition: A | Discharge status: Home / Self Care | Payer: Medicare Other | Attending: Internal Medicine | Admitting: Internal Medicine

## 2019-09-17 DIAGNOSIS — N2581 Secondary hyperparathyroidism of renal origin: Secondary | ICD-10-CM | POA: Diagnosis present

## 2019-09-17 DIAGNOSIS — J810 Acute pulmonary edema: Secondary | ICD-10-CM | POA: Diagnosis present

## 2019-09-17 DIAGNOSIS — J9611 Chronic respiratory failure with hypoxia: Secondary | ICD-10-CM | POA: Diagnosis present

## 2019-09-17 DIAGNOSIS — Z7982 Long term (current) use of aspirin: Secondary | ICD-10-CM

## 2019-09-17 DIAGNOSIS — E875 Hyperkalemia: Secondary | ICD-10-CM | POA: Diagnosis present

## 2019-09-17 DIAGNOSIS — I120 Hypertensive chronic kidney disease with stage 5 chronic kidney disease or end stage renal disease: Secondary | ICD-10-CM | POA: Diagnosis present

## 2019-09-17 DIAGNOSIS — Z992 Dependence on renal dialysis: Secondary | ICD-10-CM | POA: Diagnosis not present

## 2019-09-17 DIAGNOSIS — R0902 Hypoxemia: Secondary | ICD-10-CM

## 2019-09-17 DIAGNOSIS — J9601 Acute respiratory failure with hypoxia: Secondary | ICD-10-CM | POA: Diagnosis present

## 2019-09-17 DIAGNOSIS — K2270 Barrett's esophagus without dysplasia: Secondary | ICD-10-CM | POA: Diagnosis present

## 2019-09-17 DIAGNOSIS — E8779 Other fluid overload: Secondary | ICD-10-CM | POA: Diagnosis present

## 2019-09-17 DIAGNOSIS — Z20822 Contact with and (suspected) exposure to covid-19: Secondary | ICD-10-CM | POA: Diagnosis present

## 2019-09-17 DIAGNOSIS — Z940 Kidney transplant status: Secondary | ICD-10-CM | POA: Diagnosis not present

## 2019-09-17 DIAGNOSIS — D631 Anemia in chronic kidney disease: Secondary | ICD-10-CM | POA: Diagnosis present

## 2019-09-17 DIAGNOSIS — Z79899 Other long term (current) drug therapy: Secondary | ICD-10-CM

## 2019-09-17 DIAGNOSIS — I700 Atherosclerosis of aorta: Secondary | ICD-10-CM | POA: Diagnosis present

## 2019-09-17 DIAGNOSIS — R0602 Shortness of breath: Secondary | ICD-10-CM

## 2019-09-17 DIAGNOSIS — G40909 Epilepsy, unspecified, not intractable, without status epilepticus: Secondary | ICD-10-CM | POA: Diagnosis present

## 2019-09-17 DIAGNOSIS — I161 Hypertensive emergency: Secondary | ICD-10-CM | POA: Diagnosis present

## 2019-09-17 DIAGNOSIS — J449 Chronic obstructive pulmonary disease, unspecified: Secondary | ICD-10-CM | POA: Diagnosis present

## 2019-09-17 DIAGNOSIS — N186 End stage renal disease: Secondary | ICD-10-CM | POA: Diagnosis present

## 2019-09-17 LAB — CBC WITH DIFFERENTIAL/PLATELET
Abs Immature Granulocytes: 0.05 10*3/uL (ref 0.00–0.07)
Basophils Absolute: 0.1 10*3/uL (ref 0.0–0.1)
Basophils Relative: 0 %
Eosinophils Absolute: 0.2 10*3/uL (ref 0.0–0.5)
Eosinophils Relative: 1 %
HCT: 39 % (ref 39.0–52.0)
Hemoglobin: 12.2 g/dL — ABNORMAL LOW (ref 13.0–17.0)
Immature Granulocytes: 0 %
Lymphocytes Relative: 7 %
Lymphs Abs: 1 10*3/uL (ref 0.7–4.0)
MCH: 31.3 pg (ref 26.0–34.0)
MCHC: 31.3 g/dL (ref 30.0–36.0)
MCV: 100 fL (ref 80.0–100.0)
Monocytes Absolute: 1 10*3/uL (ref 0.1–1.0)
Monocytes Relative: 8 %
Neutro Abs: 11.2 10*3/uL — ABNORMAL HIGH (ref 1.7–7.7)
Neutrophils Relative %: 84 %
Platelets: 208 10*3/uL (ref 150–400)
RBC: 3.9 MIL/uL — ABNORMAL LOW (ref 4.22–5.81)
RDW: 14.5 % (ref 11.5–15.5)
WBC: 13.5 10*3/uL — ABNORMAL HIGH (ref 4.0–10.5)
nRBC: 0 % (ref 0.0–0.2)

## 2019-09-17 LAB — BASIC METABOLIC PANEL
Anion gap: 16 — ABNORMAL HIGH (ref 5–15)
BUN: 60 mg/dL — ABNORMAL HIGH (ref 8–23)
CO2: 25 mmol/L (ref 22–32)
Calcium: 9 mg/dL (ref 8.9–10.3)
Chloride: 94 mmol/L — ABNORMAL LOW (ref 98–111)
Creatinine, Ser: 9.48 mg/dL — ABNORMAL HIGH (ref 0.61–1.24)
GFR calc Af Amer: 6 mL/min — ABNORMAL LOW (ref >60)
GFR calc non Af Amer: 5 mL/min — ABNORMAL LOW (ref >60)
Glucose, Bld: 85 mg/dL (ref 70–99)
Potassium: 5.7 mmol/L — ABNORMAL HIGH (ref 3.5–5.1)
Sodium: 135 mmol/L (ref 135–145)

## 2019-09-17 LAB — RESPIRATORY PANEL BY RT PCR (FLU A&B, COVID)
Influenza A by PCR: NEGATIVE
Influenza B by PCR: NEGATIVE
SARS Coronavirus 2 by RT PCR: NEGATIVE

## 2019-09-17 LAB — TROPONIN I (HIGH SENSITIVITY)
Troponin I (High Sensitivity): 73 ng/L — ABNORMAL HIGH (ref <18)
Troponin I (High Sensitivity): 198 ng/L (ref <18)

## 2019-09-17 LAB — HEPATITIS B SURFACE ANTIGEN: Hepatitis B Surface Ag: NONREACTIVE

## 2019-09-17 LAB — BRAIN NATRIURETIC PEPTIDE: B Natriuretic Peptide: 1936 pg/mL — ABNORMAL HIGH (ref 0.0–100.0)

## 2019-09-17 LAB — POC SARS CORONAVIRUS 2 AG: SARS Coronavirus 2 Ag: NEGATIVE

## 2019-09-17 LAB — PHOSPHORUS: Phosphorus: 5.9 mg/dL — ABNORMAL HIGH (ref 2.5–4.6)

## 2019-09-17 IMAGING — DX DG CHEST 1V PORT
1 series · 1 of 1 positions shown · non-contrast
Comparison: Chest radiograph dated [DATE].

CLINICAL DATA: 63-year-old male with shortness of breath.

EXAM:
PORTABLE CHEST 1 VIEW

[chest ap]
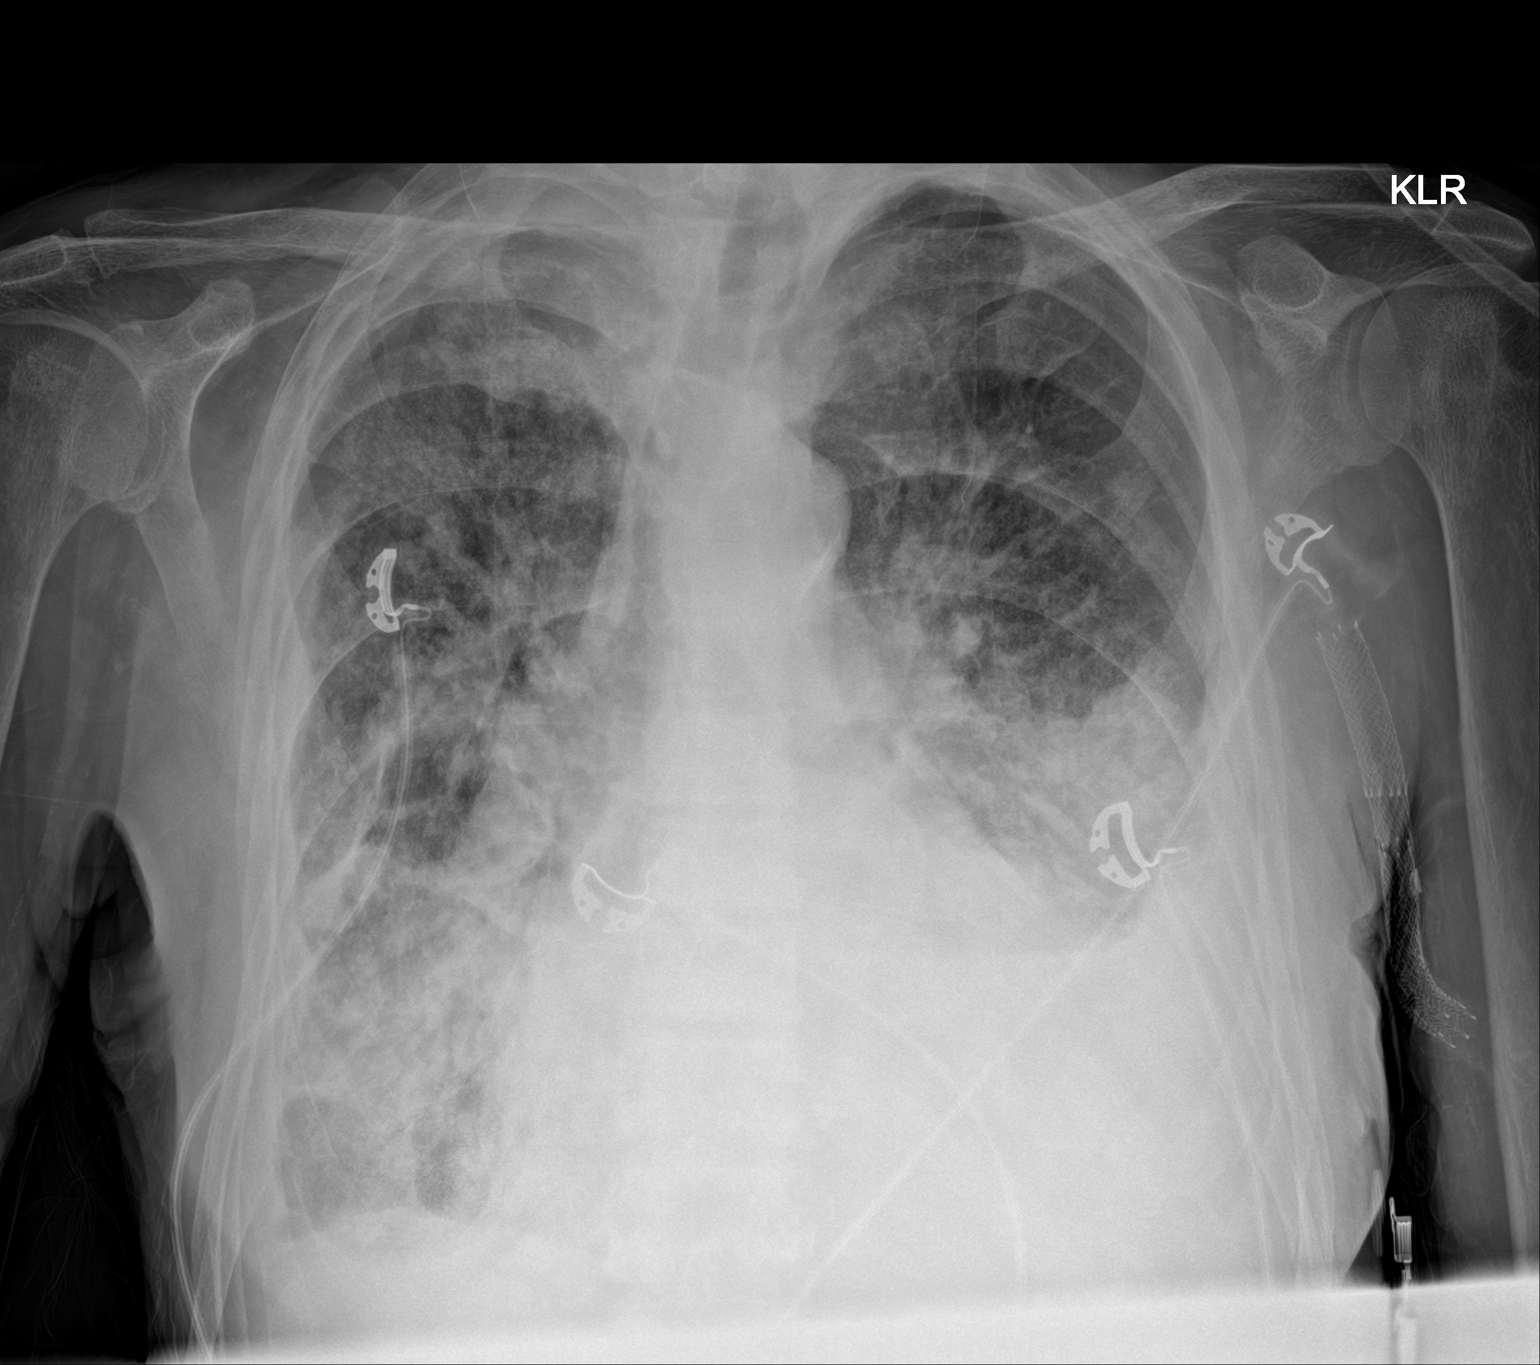

[1 of 1 positions shown; findings below may reference images not displayed]

FINDINGS: There is cardiomegaly with vascular congestion and edema. Bilateral
pleural effusions, left greater right with associated mid to lower
lung field opacities which may represent atelectasis or pneumonia.
Overall the appearance of the lungs is similar to prior radiograph.
There is no pneumothorax. Atherosclerotic calcification of the
aorta. No acute osseous pathology. Left axillary vascular stent.
IMPRESSION: 1. Cardiomegaly with vascular congestion and edema
2. Bilateral pleural effusions, left greater right with bilateral
airspace opacities concerning for developing pneumonia. Clinical
correlation and follow-up recommended.

## 2019-09-17 MED ORDER — ACETAMINOPHEN 325 MG PO TABS: 650.0000 mg | ORAL_TABLET | Freq: Four times a day (QID) | ORAL | Status: DC | PRN

## 2019-09-17 MED ORDER — ACETAMINOPHEN 650 MG RE SUPP: 650.0000 mg | Freq: Four times a day (QID) | RECTAL | Status: DC | PRN

## 2019-09-17 MED ORDER — SEVELAMER CARBONATE 800 MG PO TABS
1600.0000 mg | ORAL_TABLET | ORAL | Status: DC
  Administered 2019-09-17 – 2019-09-18 (×2): 1600 mg via ORAL
  Filled 2019-09-17 (×2): qty 2

## 2019-09-17 MED ORDER — METOPROLOL TARTRATE 50 MG PO TABS
100.0000 mg | ORAL_TABLET | Freq: Two times a day (BID) | ORAL | Status: DC
  Administered 2019-09-17 – 2019-09-18 (×3): 100 mg via ORAL
  Filled 2019-09-17 (×4): qty 2

## 2019-09-17 MED ORDER — CHLORHEXIDINE GLUCONATE CLOTH 2 % EX PADS
6.0000 | MEDICATED_PAD | Freq: Every day | CUTANEOUS | Status: DC
  Administered 2019-09-18 – 2019-09-19 (×2): 6 via TOPICAL

## 2019-09-17 MED ORDER — SEVELAMER CARBONATE 800 MG PO TABS
2400.0000 mg | ORAL_TABLET | Freq: Three times a day (TID) | ORAL | Status: DC
  Administered 2019-09-18 – 2019-09-19 (×2): 2400 mg via ORAL
  Filled 2019-09-17 (×2): qty 3

## 2019-09-17 MED ORDER — LEVETIRACETAM 500 MG PO TABS
500.0000 mg | ORAL_TABLET | ORAL | Status: DC
  Filled 2019-09-17: qty 1

## 2019-09-17 MED ORDER — PRAVASTATIN SODIUM 20 MG PO TABS
20.0000 mg | ORAL_TABLET | Freq: Every day | ORAL | Status: DC
  Administered 2019-09-18 – 2019-09-19 (×2): 20 mg via ORAL
  Filled 2019-09-17 (×2): qty 1

## 2019-09-17 MED ORDER — LEVOFLOXACIN IN D5W 750 MG/150ML IV SOLN
750.0000 mg | Freq: Once | INTRAVENOUS | Status: AC
  Administered 2019-09-17: 750 mg via INTRAVENOUS
  Filled 2019-09-17: qty 150

## 2019-09-17 MED ORDER — PANTOPRAZOLE SODIUM 40 MG PO TBEC
40.0000 mg | DELAYED_RELEASE_TABLET | Freq: Every day | ORAL | Status: DC
  Administered 2019-09-18 – 2019-09-19 (×2): 40 mg via ORAL
  Filled 2019-09-17 (×2): qty 1

## 2019-09-17 MED ORDER — CLONIDINE HCL 0.1 MG PO TABS
0.3000 mg | ORAL_TABLET | Freq: Three times a day (TID) | ORAL | Status: DC
  Administered 2019-09-17 – 2019-09-19 (×4): 0.3 mg via ORAL
  Filled 2019-09-17 (×4): qty 3

## 2019-09-17 MED ORDER — LABETALOL HCL 5 MG/ML IV SOLN
10.0000 mg | Freq: Once | INTRAVENOUS | Status: AC
  Administered 2019-09-17: 10 mg via INTRAVENOUS
  Filled 2019-09-17: qty 4

## 2019-09-17 MED ORDER — ONDANSETRON HCL 4 MG/2ML IJ SOLN: 4.0000 mg | Freq: Four times a day (QID) | INTRAMUSCULAR | Status: DC | PRN

## 2019-09-17 MED ORDER — LABETALOL HCL 5 MG/ML IV SOLN
20.0000 mg | Freq: Once | INTRAVENOUS | Status: DC
  Filled 2019-09-17: qty 4

## 2019-09-17 MED ORDER — ASPIRIN EC 81 MG PO TBEC
81.0000 mg | DELAYED_RELEASE_TABLET | Freq: Every day | ORAL | Status: DC
  Administered 2019-09-18 – 2019-09-19 (×2): 81 mg via ORAL
  Filled 2019-09-17 (×2): qty 1

## 2019-09-17 MED ORDER — MINOXIDIL 2.5 MG PO TABS
2.5000 mg | ORAL_TABLET | Freq: Two times a day (BID) | ORAL | Status: DC
  Administered 2019-09-18 – 2019-09-19 (×3): 2.5 mg via ORAL
  Filled 2019-09-17 (×5): qty 1

## 2019-09-17 MED ORDER — CINACALCET HCL 30 MG PO TABS
60.0000 mg | ORAL_TABLET | Freq: Every day | ORAL | Status: DC
  Administered 2019-09-18 – 2019-09-19 (×2): 60 mg via ORAL
  Filled 2019-09-17 (×2): qty 2

## 2019-09-17 MED ORDER — ENALAPRIL MALEATE 20 MG PO TABS
20.0000 mg | ORAL_TABLET | Freq: Every day | ORAL | Status: DC
  Filled 2019-09-17: qty 1

## 2019-09-17 MED ORDER — HEPARIN SODIUM (PORCINE) 5000 UNIT/ML IJ SOLN
5000.0000 [IU] | Freq: Three times a day (TID) | INTRAMUSCULAR | Status: DC
  Administered 2019-09-17 – 2019-09-19 (×5): 5000 [IU] via SUBCUTANEOUS
  Filled 2019-09-17 (×5): qty 1

## 2019-09-17 MED ORDER — HEPARIN SODIUM (PORCINE) 1000 UNIT/ML DIALYSIS
20.0000 [IU]/kg | INTRAMUSCULAR | Status: DC | PRN
  Filled 2019-09-17: qty 2

## 2019-09-17 MED ORDER — LEVETIRACETAM 500 MG PO TABS
1000.0000 mg | ORAL_TABLET | Freq: Two times a day (BID) | ORAL | Status: DC
  Filled 2019-09-17: qty 2

## 2019-09-17 MED ORDER — ONDANSETRON HCL 4 MG PO TABS: 4.0000 mg | ORAL_TABLET | Freq: Four times a day (QID) | ORAL | Status: DC | PRN

## 2019-09-17 NOTE — Significant Event (Addendum)
Rapid Response Event Note  Overview: Time Called: 0905 Arrival Time: 0256 Event Type: Cardiac  Initial Focused Assessment:  Patient is AA+Ox4. Denies CP, SOB. Found on assessment to be hypertensive with SBP > 200 mmHg. Per bedside RN, patient was transferred to room 234 from ED with little/no handoff. Patient had orders for urgent HD but was admitted to floor bed instead of first going to H/D. RRT transported patient to H/D bay and gave an SBAR report to H/D nurse. Plan is to return to room 234 following H/D unless condition worsens. This RN instructed HD nurse and bedside RN to call RRT again for reassessment if patient's condition worsens.  Event Summary:  Transported to H/D   Event End Time: [place]

## 2019-09-17 NOTE — Progress Notes (Signed)
Keys to car

## 2019-09-17 NOTE — ED Notes (Signed)
This RN and English as a second language teacher attempted for IV and blood draw. No success.

## 2019-09-17 NOTE — Progress Notes (Signed)
Patient admitted from the ER with no verbal report given to myself. Upon entering room, patient with labored breathing and per Arlisha, NT, initial admit B.P. = about 773 systolic. Patient alert and oriented. Called rapid response due to extremely elevated BP, respiratory status, and urgent need for HD. Discussed in rapid response patient's obvious clinical condition and the need for intervention prior to resuming with a usual floor admission. Quickly moved patient down to HD department after giving verbal report to HD nurse. Sent chart stickers via tube. Filled out both HD consents and left with the HD nurse who said that he would have patient sign. Wenda Low [facility]

## 2019-09-17 NOTE — ED Provider Notes (Signed)
[facility] Emergency Department Provider Note       Time seen: ----------------------------------------- 3:55 PM on 09/17/2019 -----------------------------------------   I have reviewed the triage vital signs and the nursing notes.  HISTORY   Chief Complaint Shortness of Breath    HPI Scott Castro is a 64 y.o. male with a history of Barrett's esophagus, chronic kidney disease on dialysis secondary to focal glomerulosclerosis, hyperparathyroidism, COPD who presents to the ED for sudden onset of shortness of breath this morning that resolved but then came back about an hour ago.  Patient was very short of breath and placed on a nonrebreather on EMS arrival.  He is a Tuesday Thursday Saturday dialysis patient and has not missed dialysis.  He has been taking his medications as prescribed.  Past Medical History:  Diagnosis Date  . Barrett's esophagus   . Chronic kidney disease    CHRONIC\  . Glomerulosclerosis, focal   . Hyperparathyroidism due to renal insufficiency [facility])     Patient Active Problem List   Diagnosis Date Noted  . COPD (chronic obstructive pulmonary disease) ([place]) 04/10/2019  . COPD exacerbation ([place]) 04/09/2019    Past Surgical History:  Procedure Laterality Date  . AV FISTULA PLACEMENT    . COLONOSCOPY N/A 03/22/2016   Procedure: COLONOSCOPY;  Surgeon: Manya Silvas, MD;  Location: [facility] ENDOSCOPY;  Service: Endoscopy;  Laterality: N/A;  . DG AV DIALYSIS GRAFT DECLOT OR    . ESOPHAGOGASTRODUODENOSCOPY (EGD) WITH PROPOFOL  03/22/2016   Procedure: ESOPHAGOGASTRODUODENOSCOPY (EGD) WITH PROPOFOL;  Surgeon: Manya Silvas, MD;  Location: [facility] ENDOSCOPY;  Service: Endoscopy;;  . FLEXIBLE BRONCHOSCOPY    . KIDNEY TRANSPLANT Right 1985  . REMOVAL TENCKHOFF CATH      Allergies Patient has no known allergies.  Social History Social History   Tobacco Use  . Smoking status: Never Smoker  . Smokeless tobacco: Never Used  Substance  Use Topics  . Alcohol use: No  . Drug use: No    Review of Systems Constitutional: Negative for fever. Cardiovascular: Negative for chest pain. Respiratory: Positive shortness of breath Gastrointestinal: Negative for abdominal pain, vomiting and diarrhea. Musculoskeletal: Negative for back pain. Skin: Negative for rash. Neurological: Negative for headaches, focal weakness or numbness.  All systems negative/normal/unremarkable except as stated in the HPI  ____________________________________________   PHYSICAL EXAM:  VITAL SIGNS: ED Triage Vitals [09/17/19 1553]  Enc Vitals Group     BP      Pulse      Resp      Temp      Temp src      SpO2 96 %     Weight      Height      Head Circumference      Peak Flow      Pain Score      Pain Loc      Pain Edu?      Excl. in [place]?     Constitutional: Alert and oriented.  Mild distress Eyes: Conjunctivae are normal. Normal extraocular movements. ENT      Head: Normocephalic and atraumatic.      Nose: No congestion/rhinnorhea.      Mouth/Throat: Mucous membranes are moist.      Neck: No stridor. Cardiovascular: Normal rate, regular rhythm. No murmurs, rubs, or gallops. Respiratory: Tachypnea with bibasilar rales Gastrointestinal: Soft and nontender. Normal bowel sounds Musculoskeletal: Nontender with normal range of motion in extremities. No lower extremity tenderness nor edema. Neurologic:  Normal  speech and language. No gross focal neurologic deficits are appreciated.  Skin:  Skin is warm, dry and intact. No rash noted. Psychiatric: Mood and affect are normal. Speech and behavior are normal.  ____________________________________________  EKG: Interpreted by me.  Sinus rhythm rate of 78 bpm, LVH, normal axis, normal QT  ____________________________________________  ED COURSE:  As part of my medical decision making, I reviewed the following data within the [place] History obtained from family if  available, nursing notes, old chart and ekg, as well as notes from prior ED visits. Patient presented for dyspnea, we will assess with labs and imaging as indicated at this time.   Procedures  Scott Castro was evaluated in Emergency Department on 09/17/2019 for the symptoms described in the history of present illness. He was evaluated in the context of the global COVID-19 pandemic, which necessitated consideration that the patient might be at risk for infection with the SARS-CoV-2 virus that causes COVID-19. Institutional protocols and algorithms that pertain to the evaluation of patients at risk for COVID-19 are in a state of rapid change based on information released by regulatory bodies including the CDC and federal and state organizations. These policies and algorithms were followed during the patient's care in the ED.  ____________________________________________   LABS (pertinent positives/negatives)  Labs Reviewed  CBC WITH DIFFERENTIAL/PLATELET - Abnormal; Notable for the following components:      Result Value   WBC 13.5 (*)    RBC 3.90 (*)    Hemoglobin 12.2 (*)    Neutro Abs 11.2 (*)    All other components within normal limits  BRAIN NATRIURETIC PEPTIDE - Abnormal; Notable for the following components:   B Natriuretic Peptide 1,936.0 (*)    All other components within normal limits  RESPIRATORY PANEL BY RT PCR (FLU A&B, COVID)  BASIC METABOLIC PANEL  POC SARS CORONAVIRUS 2 AG -  ED  TROPONIN I (HIGH SENSITIVITY)   CRITICAL CARE Performed by: Laurence Aly   Total critical care time: 30 minutes  Critical care time was exclusive of separately billable procedures and treating other patients.  Critical care was necessary to treat or prevent imminent or life-threatening deterioration.  Critical care was time spent personally by me on the following activities: development of treatment plan with patient and/or surrogate as well as nursing, discussions with consultants,  evaluation of patient's response to treatment, examination of patient, obtaining history from patient or surrogate, ordering and performing treatments and interventions, ordering and review of laboratory studies, ordering and review of radiographic studies, pulse oximetry and re-evaluation of patient's condition.  RADIOLOGY Images were viewed by me  Chest x-ray  IMPRESSION:  1. Cardiomegaly with vascular congestion and edema  2. Bilateral pleural effusions, left greater right with bilateral  airspace opacities concerning for developing pneumonia. Clinical  correlation and follow-up recommended.  ____________________________________________   DIFFERENTIAL DIAGNOSIS   CHF, COPD, pneumonia, COVID-19, PE, pneumothorax  FINAL ASSESSMENT AND PLAN  Respiratory distress, pulmonary edema, hypertension, possible pneumonia   Plan: The patient had presented for acute respiratory distress.  Patient has been on a nonrebreather throughout his stay and seems to tolerate that well.  Patient's labs revealed some leukocytosis and findings suggestive of acute congestive heart failure. Patient's imaging revealed cardiomegaly with vascular congestion and edema with bilateral pleural effusions.  It is possible he has developed pneumonia, however his symptomatology suggest this is likely flash pulmonary edema.  I have ordered IV labetalol as well as Levaquin.  I have  discussed with nephrology will arrange for dialysis for him and I will discuss with the hospitalist for admission.   Laurence Aly, MD    Note: This note was generated in part or whole with voice recognition software. Voice recognition is usually quite accurate but there are transcription errors that can and very often do occur. I apologize for any typographical errors that were not detected and corrected.     Earleen Newport, MD 09/17/19 [phone]

## 2019-09-17 NOTE — ED Triage Notes (Signed)
PT to ED via EMS from home. PT had episode of SOB this morning that resolved. SOB came back about an hour ago, pt was tripodding when EMS arrived. EMS placed pt on NRB 15L and pt came up to 96%. PT is a tue, thur, sat dialysis pt, has not missed any dialysis. AO and talking in full sentences.

## 2019-09-17 NOTE — Progress Notes (Signed)
[facility], [place] 09/17/19  Subjective:   LOS: 0 No intake/output data recorded.  Last HD was on Saturday Patient reports that since this morning he has been short of breath He states that he was taking all his medications as prescribed Blood pressure elevated to 208/85 upon arrival Chest x-ray shows pulmonary venous congestion Patient denies any fever or chills He has chronic cough and sputum production No complaints of nausea, vomiting or diarrhea  Objective:  Vital signs in last 24 hours:  Temp:  [98.2 F (36.8 C)] 98.2 F (36.8 C) (02/08 1558) Pulse Rate:  [80] 80 (02/08 1558) Resp:  [20] 20 (02/08 1558) BP: (208)/(85) 208/85 (02/08 1558) SpO2:  [88 %-96 %] 88 % (02/08 1558) Weight:  [67 kg] 67 kg (02/08 1559)  Weight change:  Filed Weights   09/17/19 1559  Weight: 67 kg    Intake/Output:   No intake or output data in the 24 hours ending 09/17/19 1717   Physical Exam: General:  No acute distress, laying in the bed  HEENT  anicteric, moist oral mucous membranes  Pulm/lungs  bilateral diffuse crackles, requiring oxygen supplementation 10 L  CVS/Heart  regular, no rub  Abdomen:   Soft, nontender  Extremities:  Trace peripheral edema  Neurologic:  Alert, oriented  Skin:  Scattered ecchymosis  Access:  AV fistula       Basic Metabolic Panel:  No results for input(s): NA, K, CL, CO2, GLUCOSE, BUN, CREATININE, CALCIUM, MG, PHOS in the last 168 hours.   CBC: Recent Labs  Lab 09/17/19 1555  WBC 13.5*  NEUTROABS 11.2*  HGB 12.2*  HCT 39.0  MCV 100.0  PLT 208     No results found for: HEPBSAG, HEPBSAB, HEPBIGM    Microbiology:  No results found for this or any previous visit (from the past 240 hour(s)).  Coagulation Studies: No results for input(s): LABPROT, INR in the last 72 hours.  Urinalysis: No results for input(s): COLORURINE, LABSPEC, PHURINE, GLUCOSEU, HGBUR, BILIRUBINUR, KETONESUR, PROTEINUR,  UROBILINOGEN, NITRITE, LEUKOCYTESUR in the last 72 hours.  Invalid input(s): APPERANCEUR    Imaging: DG Chest Port 1 View  Result Date: 09/17/2019 CLINICAL DATA:  64 year old male with shortness of breath. EXAM: PORTABLE CHEST 1 VIEW COMPARISON:  Chest radiograph dated 04/09/2019. FINDINGS: There is cardiomegaly with vascular congestion and edema. Bilateral pleural effusions, left greater right with associated mid to lower lung field opacities which may represent atelectasis or pneumonia. Overall the appearance of the lungs is similar to prior radiograph. There is no pneumothorax. Atherosclerotic calcification of the aorta. No acute osseous pathology. Left axillary vascular stent. IMPRESSION: 1. Cardiomegaly with vascular congestion and edema 2. Bilateral pleural effusions, left greater right with bilateral airspace opacities concerning for developing pneumonia. Clinical correlation and follow-up recommended. Electronically Signed   By: Anner Crete M.D.   On: 09/17/2019 16:27     Medications:   . levofloxacin (LEVAQUIN) IV     . labetalol  20 mg Intravenous Once     Assessment/ Plan:  64 y.o. male with ESRD on HD TTS, history of multiple lung nodules, history of FSGS, anemia chronic kidney disease, secondary hyperparathyroidism, Barrett's esophagus,, history of renal transplantation in the pastwho was admitted to [place] with: Active Problems:   Acute hypoxemic respiratory failure ([place])  [facility]/TTHS  #. ESRD with acute pulmonary edema and malignant hypertension We will arrange for urgent hemodialysis tonight for volume removal UF goal ~ 2 kg as tolerated Will evaluate for  repeat treatment tomorrow  #. Anemia of CKD  Lab Results  Component Value Date   HGB 12.2 (L) 09/17/2019   Low dose EPO with HD once blood pressure improves and hemoglobin is less than 10  #. SHPTH     Component Value Date/Time   PTH 93 (H) 04/09/2019 8184   Lab Results  Component  Value Date   PHOS 4.6 04/09/2019   Monitor calcium and phos level during this admission Phosphorus controlled at present  #. HTN  Continue home medications We will consider changing ACE inhibitor to ARB as ACE-i inhibitor can be dialyzed out   LOS: 0 Eilis Chestnutt [facility] 2/8/20215:17 PM  [place], [place]

## 2019-09-17 NOTE — H&P (Addendum)
History and Physical:    Scott Castro   WPY:[id number] DOB: 27-Sep-1955 DOA: 09/17/2019  Referring MD/provider: Lenise Arena PCP: Franciso Bend, MD   Patient coming from: Home  Chief Complaint: Shortness of breath  History of Present Illness:   Scott Castro is an 64 y.o. male with medical history significant for Barrett's esophagus, end-stage renal disease on hemodialysis, COPD, seizure disorder, hypertension, hyperparathyroidism, who presented to the hospital because of sudden onset of shortness of breath that started this morning.  He said he was well until this morning when he noticed that he had trouble breathing.  It is progressively worsened so he came to the emergency room for further evaluation.  Shortness of breath was associated with wheezing and cough which also started today.  Shortness of breath is worse with exertion.  He feels as though he has phlegm stuck in his throat. He said he was admitted to the hospital in September 2020 for similar symptoms.  He is a known hypertensive but he said that his blood pressure has been uncontrolled for almost 6 weeks now.  He said he was started on a new blood pressure pill not too long ago but it has not helped with his blood pressure.  His systolic blood pressure sometimes runs in the 180s.   No chest pain, palpitations, dizziness, vomiting, diarrhea, abdominal pain.  He makes very little urine.   ED Course:  The patient was severely hypertensive and hypoxemic.  Blood pressure was 200/85 on admission.  His oxygen saturation was 88% on 6 L/min oxygen.  BNP was 1,936, WBC was 13.5, chest x-ray showed cardiomegaly with vascular congestion and edema, bilateral pleural effusions with bilateral airspace opacities concerning for developing pneumonia.  He was placed on 10 L/min oxygen via nonrebreathing mask.  He was given IV Levaquin and IV labetalol.  ROS:   ROS all other systems reviewed were negative  Past Medical History:   Past  Medical History:  Diagnosis Date  . Barrett's esophagus   . Chronic kidney disease    CHRONIC\  . Glomerulosclerosis, focal   . Hyperparathyroidism due to renal insufficiency [facility])     Past Surgical History:   Past Surgical History:  Procedure Laterality Date  . AV FISTULA PLACEMENT    . COLONOSCOPY N/A 03/22/2016   Procedure: COLONOSCOPY;  Surgeon: Manya Silvas, MD;  Location: [facility] ENDOSCOPY;  Service: Endoscopy;  Laterality: N/A;  . DG AV DIALYSIS GRAFT DECLOT OR    . ESOPHAGOGASTRODUODENOSCOPY (EGD) WITH PROPOFOL  03/22/2016   Procedure: ESOPHAGOGASTRODUODENOSCOPY (EGD) WITH PROPOFOL;  Surgeon: Manya Silvas, MD;  Location: [facility] ENDOSCOPY;  Service: Endoscopy;;  . FLEXIBLE BRONCHOSCOPY    . KIDNEY TRANSPLANT Right 1985  . REMOVAL TENCKHOFF CATH      Social History:   Social History   Socioeconomic History  . Marital status: Single    Spouse name: Not on file  . Number of children: Not on file  . Years of education: Not on file  . Highest education level: Not on file  Occupational History  . Not on file  Tobacco Use  . Smoking status: Never Smoker  . Smokeless tobacco: Never Used  Substance and Sexual Activity  . Alcohol use: No    Comment: "once every couple months"  . Drug use: No  . Sexual activity: Not on file  Other Topics Concern  . Not on file  Social History Narrative  . Not on file   Social Determinants of Health  Financial Resource Strain:   . Difficulty of Paying Living Expenses: Not on file  Food Insecurity:   . Worried About Charity fundraiser in the Last Year: Not on file  . Ran Out of Food in the Last Year: Not on file  Transportation Needs:   . Lack of Transportation (Medical): Not on file  . Lack of Transportation (Non-Medical): Not on file  Physical Activity:   . Days of Exercise per Week: Not on file  . Minutes of Exercise per Session: Not on file  Stress:   . Feeling of Stress : Not on file  Social Connections:   .  Frequency of Communication with Friends and Family: Not on file  . Frequency of Social Gatherings with Friends and Family: Not on file  . Attends Religious Services: Not on file  . Active Member of Clubs or Organizations: Not on file  . Attends Archivist Meetings: Not on file  . Marital Status: Not on file  Intimate Partner Violence:   . Fear of Current or Ex-Partner: Not on file  . Emotionally Abused: Not on file  . Physically Abused: Not on file  . Sexually Abused: Not on file    Allergies   Patient has no known allergies.  Family history:   History reviewed. No pertinent family history.  Current Medications:   Prior to Admission medications   Medication Sig Start Date End Date Taking? Authorizing Provider  aspirin 81 MG tablet Take 81 mg by mouth daily.    [provider]  cinacalcet (SENSIPAR) 30 MG tablet Take 60 mg by mouth daily.    [provider]  cloNIDine (CATAPRES) 0.1 MG tablet Take 0.1 mg by mouth 2 (two) times daily.    [provider]  cloNIDine (CATAPRES) 0.3 MG tablet Take 0.3 mg by mouth 3 (three) times daily.    [provider]  enalapril (VASOTEC) 20 MG tablet Take 20 mg by mouth daily.    [provider]  esomeprazole (NEXIUM) 40 MG capsule Take 40 mg by mouth daily at 12 noon.    [provider]  fexofenadine (ALLEGRA) 180 MG tablet Take 180 mg by mouth daily.    [provider]  levETIRAcetam (KEPPRA) 1000 MG tablet Take 1,000 mg by mouth 2 (two) times daily.    [provider]  levETIRAcetam (KEPPRA) 500 MG tablet Take 500 mg by mouth 3 (three) times a week.    [provider]  metoprolol (LOPRESSOR) 100 MG tablet Take 100 mg by mouth 2 (two) times daily.    [provider]  metoprolol (LOPRESSOR) 50 MG tablet Take 50 mg by mouth 2 (two) times daily.    [provider]  minoxidil (LONITEN) 2.5 MG tablet Take by mouth 2 (two) times daily.     [provider]  multivitamin (RENA-VIT) TABS tablet Take 1 tablet by mouth daily.    [provider]  ondansetron (ZOFRAN) 8 MG tablet Take 8 mg by mouth every 8 (eight) hours as needed for nausea or vomiting.    [provider]  pantoprazole (PROTONIX) 40 MG tablet Take 40 mg by mouth daily. 03/27/19   [provider]  pravastatin (PRAVACHOL) 20 MG tablet Take 20 mg by mouth daily.    [provider]  sevelamer carbonate (RENVELA) 800 MG tablet Take 800 mg by mouth 3 (three) times daily with meals.    [provider]    Physical Exam:   Vitals:  09/17/19 1553 09/17/19 1558 09/17/19 1559  BP:  (!) 208/85   Pulse:  80   Resp:  20   Temp:  98.2 F (36.8 C)   TempSrc:  Oral   SpO2: 96% (!) 88%   Weight:   67 kg  Height:   5\' 9"  (1.753 m)     Physical Exam: Blood pressure (!) 208/85, pulse 80, temperature 98.2 F (36.8 C), temperature source Oral, resp. rate 20, height 5\' 9"  (1.753 m), weight 67 kg, SpO2 (!) 88 %. Gen: No acute distress. Head: Normocephalic, atraumatic. Eyes: Pupils equal, round and reactive to light. Extraocular movements intact.  Sclerae nonicteric.  Mouth: Moist mucous membranes Neck: Supple, no thyromegaly, no lymphadenopathy, no jugular venous distention. Chest: Air entry reduced bilaterally, bilateral expiratory wheezing, bibasilar rales. CV: Heart sounds are regular with an S1, S2. No murmurs, rubs or gallops.  Abdomen: Soft, nontender, nondistended with normal active bowel sounds. No palpable masses. Extremities: Extremities are without clubbing, or cyanosis. No edema. Pedal pulses 2+.  Skin: Warm and dry. No rashes, lesions or wounds Neuro: Alert and oriented times 3; grossly nonfocal.  Psych: Insight is good and judgment is appropriate. Mood and affect normal.   Data Review:    Labs: Basic Metabolic Panel: No results for input(s): NA, K, CL, CO2, GLUCOSE, BUN, CREATININE, CALCIUM, MG, PHOS in  the last 168 hours. Liver Function Tests: No results for input(s): AST, ALT, ALKPHOS, BILITOT, PROT, ALBUMIN in the last 168 hours. No results for input(s): LIPASE, AMYLASE in the last 168 hours. No results for input(s): AMMONIA in the last 168 hours. CBC: Recent Labs  Lab 09/17/19 1555  WBC 13.5*  NEUTROABS 11.2*  HGB 12.2*  HCT 39.0  MCV 100.0  PLT 208   Cardiac Enzymes: No results for input(s): CKTOTAL, CKMB, CKMBINDEX, TROPONINI in the last 168 hours.  BNP (last 3 results) No results for input(s): PROBNP in the last 8760 hours. CBG: No results for input(s): GLUCAP in the last 168 hours.  Urinalysis No results found for: COLORURINE, APPEARANCEUR, LABSPEC, PHURINE, GLUCOSEU, HGBUR, BILIRUBINUR, KETONESUR, PROTEINUR, UROBILINOGEN, NITRITE, LEUKOCYTESUR    Radiographic Studies: DG Chest Port 1 View  Result Date: 09/17/2019 CLINICAL DATA:  64 year old male with shortness of breath. EXAM: PORTABLE CHEST 1 VIEW COMPARISON:  Chest radiograph dated 04/09/2019. FINDINGS: There is cardiomegaly with vascular congestion and edema. Bilateral pleural effusions, left greater right with associated mid to lower lung field opacities which may represent atelectasis or pneumonia. Overall the appearance of the lungs is similar to prior radiograph. There is no pneumothorax. Atherosclerotic calcification of the aorta. No acute osseous pathology. Left axillary vascular stent. IMPRESSION: 1. Cardiomegaly with vascular congestion and edema 2. Bilateral pleural effusions, left greater right with bilateral airspace opacities concerning for developing pneumonia. Clinical correlation and follow-up recommended. Electronically Signed   By: Anner Crete M.D.   On: 09/17/2019 16:27    EKG: Independently reviewed.  Normal sinus rhythm, LVH   Assessment/Plan:   Active Problems:   Acute hypoxemic respiratory failure (HCC)   Acute pulmonary edema/fluid overload from hypertensive emergency and ESRD:  Admit to telemetry.  Nephrologist has been consulted for emergent hemodialysis.  Hopefully this will help with pulmonary edema and hypertension.  Continue home antihypertensives.  Doubt pneumonia at this time no antibiotics will be continued.  Repeat chest x-ray tomorrow after hemodialysis.  Chart review showed that 2D echo in August 2020 revealed EF estimated at 60 to 65% and there was no evidence of diastolic dysfunction.  Bilateral pleural effusions: This is likely due to fluid overload.  Acute hypoxemic respiratory failure: Patient is on 10 L/min oxygen via nonrebreathing mask and oxygen saturation is 94%.  Taper off nonrebreathing mask to oxygen via nasal cannula.  Seizure disorder: Continue Keppra  Body mass index is 21.81 kg/m.  Other information:   DVT prophylaxis: Heparin Code Status: Full code. Family Communication: Plan discussed with the patient Disposition Plan: Possible discharge home in 2 to 3 days Consults called: Nephrologist, Dr. Candiss Norse Admission status: Inpatient  The medical decision making on this patient was of high complexity and the patient is at high risk for clinical deterioration, therefore this is a level 3 visit.   Time spent 60 minutes  [facility]   How to contact the [facility] Attending or Consulting provider [place] or covering provider during after hours [place], for this patient?   1. Check the care team in [facility] and look for a) attending/consulting TR[facility] provider listed and b) the [facility] team listed 2. Log into [email or web address] and use [place]'s universal password to access. If you do not have the password, please contact the hospital operator. 3. Locate the [facility] provider you are looking for under [facility] and page to a number that you can be directly reached. 4. If you still have difficulty reaching the provider, please page the [facility] (Director on Call) for the Hospitalists listed on amion for assistance.  09/17/2019, 5:26 PM

## 2019-09-18 ENCOUNTER — Inpatient Hospital Stay: Payer: Medicare Other

## 2019-09-18 DIAGNOSIS — Z992 Dependence on renal dialysis: Secondary | ICD-10-CM

## 2019-09-18 DIAGNOSIS — N186 End stage renal disease: Secondary | ICD-10-CM

## 2019-09-18 DIAGNOSIS — I161 Hypertensive emergency: Secondary | ICD-10-CM | POA: Diagnosis present

## 2019-09-18 DIAGNOSIS — J810 Acute pulmonary edema: Secondary | ICD-10-CM | POA: Diagnosis present

## 2019-09-18 LAB — CBC
HCT: 32 % — ABNORMAL LOW (ref 39.0–52.0)
Hemoglobin: 10.3 g/dL — ABNORMAL LOW (ref 13.0–17.0)
MCH: 31.9 pg (ref 26.0–34.0)
MCHC: 32.2 g/dL (ref 30.0–36.0)
MCV: 99.1 fL (ref 80.0–100.0)
Platelets: 175 10*3/uL (ref 150–400)
RBC: 3.23 MIL/uL — ABNORMAL LOW (ref 4.22–5.81)
RDW: 14.6 % (ref 11.5–15.5)
WBC: 5.6 10*3/uL (ref 4.0–10.5)
nRBC: 0 % (ref 0.0–0.2)

## 2019-09-18 LAB — BASIC METABOLIC PANEL
Anion gap: 9 (ref 5–15)
BUN: 39 mg/dL — ABNORMAL HIGH (ref 8–23)
CO2: 34 mmol/L — ABNORMAL HIGH (ref 22–32)
Calcium: 9 mg/dL (ref 8.9–10.3)
Chloride: 97 mmol/L — ABNORMAL LOW (ref 98–111)
Creatinine, Ser: 7.01 mg/dL — ABNORMAL HIGH (ref 0.61–1.24)
GFR calc Af Amer: 9 mL/min — ABNORMAL LOW (ref >60)
GFR calc non Af Amer: 8 mL/min — ABNORMAL LOW (ref >60)
Glucose, Bld: 102 mg/dL — ABNORMAL HIGH (ref 70–99)
Potassium: 5.4 mmol/L — ABNORMAL HIGH (ref 3.5–5.1)
Sodium: 140 mmol/L (ref 135–145)

## 2019-09-18 LAB — HEPATITIS B CORE ANTIBODY, IGM: Hep B C IgM: NONREACTIVE

## 2019-09-18 IMAGING — CR DG CHEST 2V
3 series · 3 of 3 positions shown · non-contrast
Comparison: [DATE]

CLINICAL DATA: Shortness of breath

EXAM:
CHEST - 2 VIEW

[chest pa]
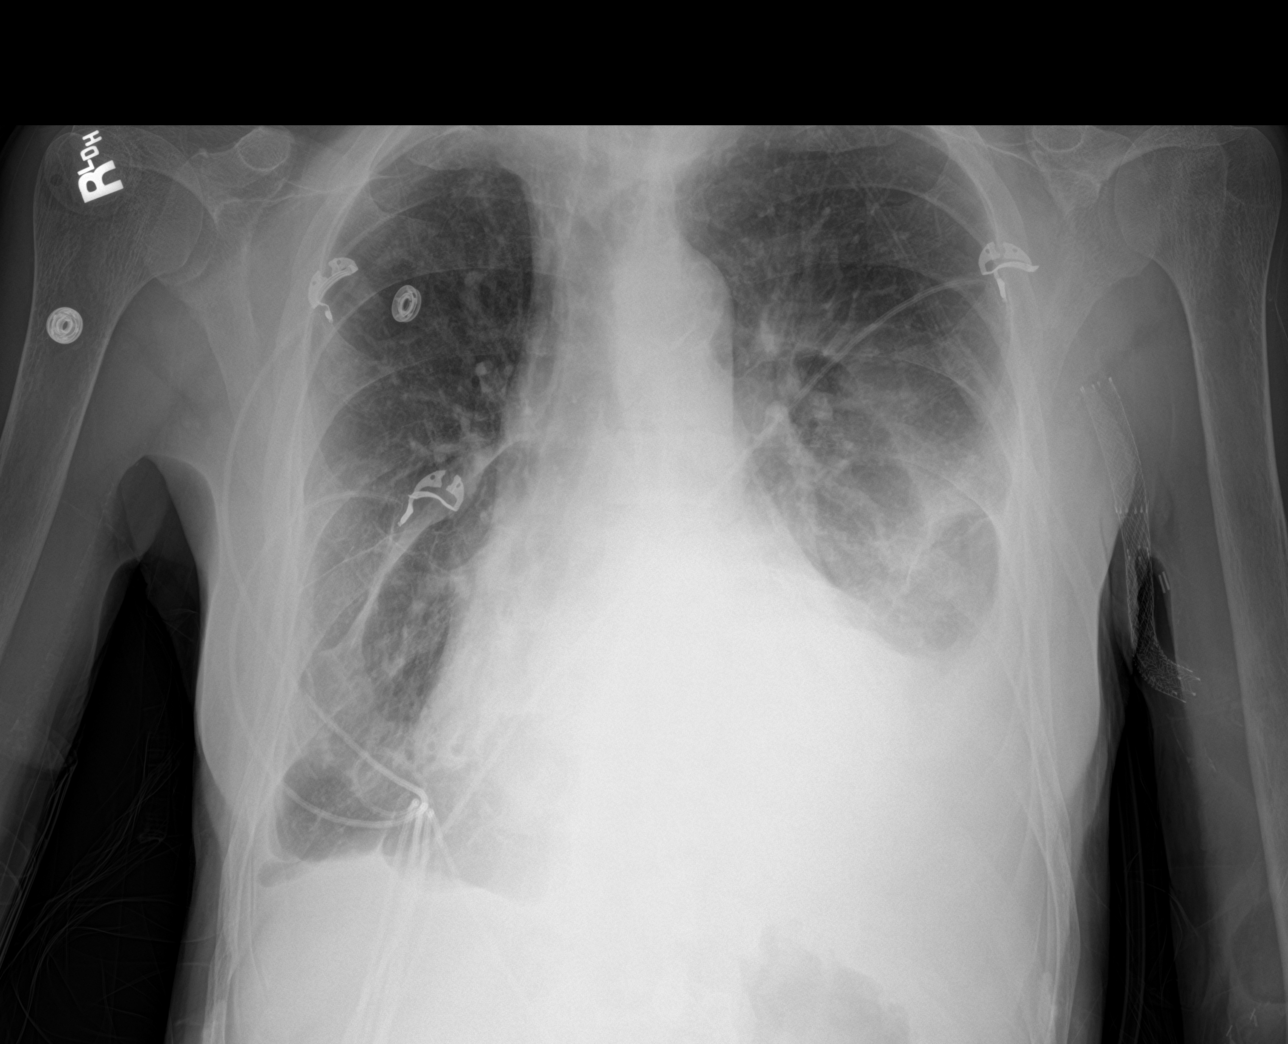

[chest lat (1 of 2)]
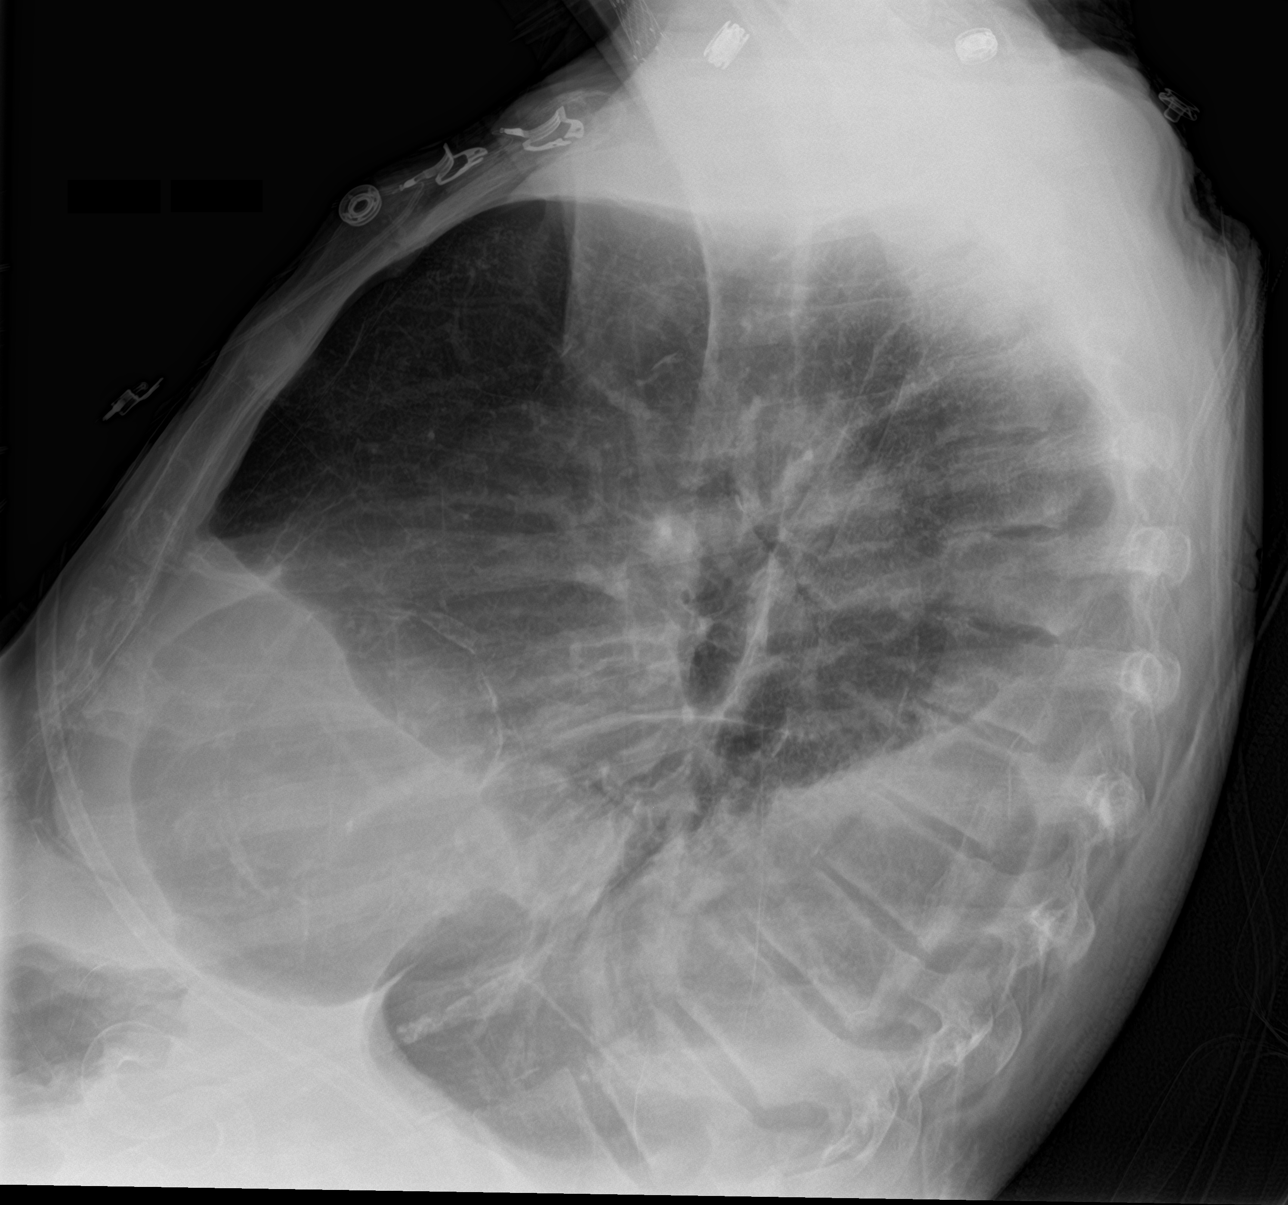

[chest lat (2 of 2)]
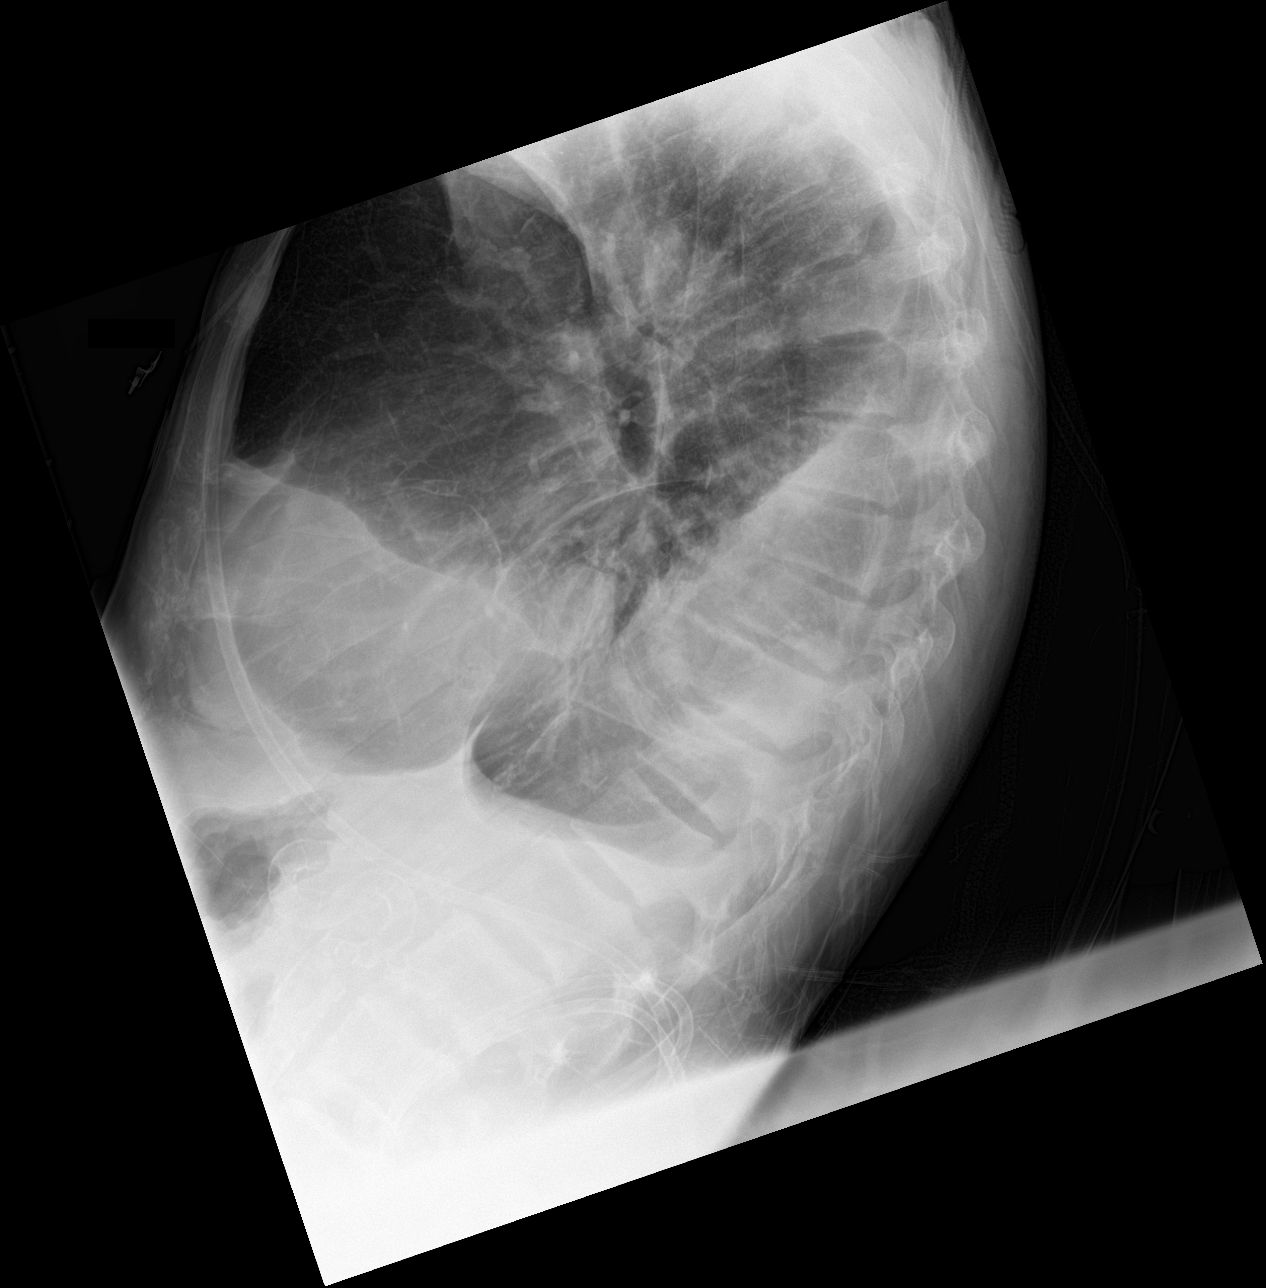

[3 of 3 positions shown; findings below may reference images not displayed]

FINDINGS: Cardiomegaly. Bilateral pleural effusions, large on the left and
small on the right. Bilateral airspace disease in the mid and lower
lungs, improved overall since prior study. Underlying COPD. No acute
bony abnormality.
IMPRESSION: Improving bilateral airspace disease. Continued bilateral effusions,
left larger than right with left mid and lower lung airspace
disease.

Cardiomegaly, COPD.

## 2019-09-18 MED ORDER — IRBESARTAN 150 MG PO TABS
150.0000 mg | ORAL_TABLET | Freq: Every day | ORAL | Status: DC
  Administered 2019-09-18: 150 mg via ORAL
  Filled 2019-09-18: qty 1

## 2019-09-18 MED ORDER — LEVETIRACETAM 500 MG PO TABS
500.0000 mg | ORAL_TABLET | ORAL | Status: DC
  Filled 2019-09-18: qty 1

## 2019-09-18 MED ORDER — EPOETIN ALFA 4000 UNIT/ML IJ SOLN
4000.0000 [IU] | INTRAMUSCULAR | Status: DC
  Filled 2019-09-18: qty 1

## 2019-09-18 NOTE — Progress Notes (Signed)
Hemodialysis patient known at [facility] TTS 11:45, patient drives self to treatments. Any change in patient mobility or statues may effect this plan. Please contact me with any dialysis placement concerns.

## 2019-09-18 NOTE — Progress Notes (Signed)
[facility] [place], [place] 09/18/19  Subjective:   LOS: 1 02/08 0701 - 02/09 0700 In: -  Out: 2000   Patient feels better after dialysis yesterday 2 L fluid was removed Blood pressure this morning 148/71 Potassium still elevated at 5.4 Patient requiring oxygen supplementation at about 4 L/min  Objective:  Vital signs in last 24 hours:  Temp:  [98.1 F (36.7 C)-99.2 F (37.3 C)] 98.4 F (36.9 C) (02/09 0832) Pulse Rate:  [59-93] 64 (02/09 0832) Resp:  [17-25] 18 (02/09 0832) BP: (148-225)/(70-96) 168/86 (02/09 0832) SpO2:  [88 %-100 %] 99 % (02/09 0832) Weight:  [66.3 kg-68.8 kg] 66.8 kg (02/09 0427)  Weight change:  Filed Weights   09/17/19 1850 09/17/19 2129 09/18/19 0427  Weight: 68.7 kg 66.3 kg 66.8 kg    Intake/Output:    Intake/Output Summary (Last 24 hours) at 09/18/2019 9983 Last data filed at 09/17/2019 2129 Gross per 24 hour  Intake --  Output 2000 ml  Net -2000 ml     Physical Exam: General:  No acute distress, laying in the bed  HEENT  anicteric, moist oral mucous membranes  Pulm/lungs  bilateral mild diffuse crackles, requiring oxygen supplementation   CVS/Heart  regular, no rub  Abdomen:   Soft, nontender  Extremities:  Trace peripheral edema  Neurologic:  Alert, oriented  Skin:  Scattered ecchymosis  Access:  AV fistula       Basic Metabolic Panel:  Recent Labs  Lab 09/17/19 1723 09/18/19 0430  NA 135 140  K 5.7* 5.4*  CL 94* 97*  CO2 25 34*  GLUCOSE 85 102*  BUN 60* 39*  CREATININE 9.48* 7.01*  CALCIUM 9.0 9.0  PHOS 5.9*  --      CBC: Recent Labs  Lab 09/17/19 1555 09/18/19 0430  WBC 13.5* 5.6  NEUTROABS 11.2*  --   HGB 12.2* 10.3*  HCT 39.0 32.0*  MCV 100.0 99.1  PLT 208 175      Lab Results  Component Value Date   HEPBSAG NON REACTIVE 09/17/2019   HEPBIGM NON REACTIVE 09/17/2019      Microbiology:  Recent Results (from the past 240 hour(s))  Respiratory Panel by RT PCR (Flu A&B,  Covid) - Nasopharyngeal Swab     Status: None   Collection Time: 09/17/19  4:02 PM   Specimen: Nasopharyngeal Swab  Result Value Ref Range Status   SARS Coronavirus 2 by RT PCR NEGATIVE NEGATIVE Final    Comment: (NOTE) SARS-CoV-2 target nucleic acids are NOT DETECTED. The SARS-CoV-2 RNA is generally detectable in upper respiratoy specimens during the acute phase of infection. The lowest concentration of SARS-CoV-2 viral copies this assay can detect is 131 copies/mL. A negative result does not preclude SARS-Cov-2 infection and should not be used as the sole basis for treatment or other patient management decisions. A negative result may occur with  improper specimen collection/handling, submission of specimen other than nasopharyngeal swab, presence of viral mutation(s) within the areas targeted by this assay, and inadequate number of viral copies (<131 copies/mL). A negative result must be combined with clinical observations, patient history, and epidemiological information. The expected result is Negative. Fact Sheet for Patients:  PinkCheek.be Fact Sheet for Healthcare Providers:  GravelBags.it This test is not yet ap proved or cleared by the [place] FDA and  has been authorized for detection and/or diagnosis of SARS-CoV-2 by FDA under an Emergency Use Authorization (EUA). This EUA will remain  in effect (meaning this test can be  used) for the duration of the COVID-19 declaration under Section 564(b)(1) of the Act, 21 U.S.C. section 360bbb-3(b)(1), unless the authorization is terminated or revoked sooner.    Influenza A by PCR NEGATIVE NEGATIVE Final   Influenza B by PCR NEGATIVE NEGATIVE Final    Comment: (NOTE) The Xpert Xpress SARS-CoV-2/FLU/RSV assay is intended as an aid in  the diagnosis of influenza from Nasopharyngeal swab specimens and  should not be used as a sole basis for treatment. Nasal washings and   aspirates are unacceptable for Xpert Xpress SARS-CoV-2/FLU/RSV  testing. Fact Sheet for Patients: PinkCheek.be Fact Sheet for Healthcare Providers: GravelBags.it This test is not yet approved or cleared by the [place] FDA and  has been authorized for detection and/or diagnosis of SARS-CoV-2 by  FDA under an Emergency Use Authorization (EUA). This EUA will remain  in effect (meaning this test can be used) for the duration of the  Covid-19 declaration under Section 564(b)(1) of the Act, 21  U.S.C. section 360bbb-3(b)(1), unless the authorization is  terminated or revoked. Performed at [facility], [address] [place], [place] [address]     Coagulation Studies: No results for input(s): LABPROT, INR in the last 72 hours.  Urinalysis: No results for input(s): COLORURINE, LABSPEC, PHURINE, GLUCOSEU, HGBUR, BILIRUBINUR, KETONESUR, PROTEINUR, UROBILINOGEN, NITRITE, LEUKOCYTESUR in the last 72 hours.  Invalid input(s): APPERANCEUR    Imaging: DG Chest Port 1 View  Result Date: 09/17/2019 CLINICAL DATA:  64 year old male with shortness of breath. EXAM: PORTABLE CHEST 1 VIEW COMPARISON:  Chest radiograph dated 04/09/2019. FINDINGS: There is cardiomegaly with vascular congestion and edema. Bilateral pleural effusions, left greater right with associated mid to lower lung field opacities which may represent atelectasis or pneumonia. Overall the appearance of the lungs is similar to prior radiograph. There is no pneumothorax. Atherosclerotic calcification of the aorta. No acute osseous pathology. Left axillary vascular stent. IMPRESSION: 1. Cardiomegaly with vascular congestion and edema 2. Bilateral pleural effusions, left greater right with bilateral airspace opacities concerning for developing pneumonia. Clinical correlation and follow-up recommended. Electronically Signed   By: Anner Crete M.D.   On: 09/17/2019  16:27     Medications:    . aspirin EC  81 mg Oral Daily  . Chlorhexidine Gluconate Cloth  6 each Topical Q0600  . cinacalcet  60 mg Oral Q breakfast  . cloNIDine  0.3 mg Oral TID  . enalapril  20 mg Oral Daily  . heparin  5,000 Units Subcutaneous Q8H  . labetalol  20 mg Intravenous Once  . levETIRAcetam  1,000 mg Oral BID  . levETIRAcetam  500 mg Oral Once per day on Mon Wed Fri  . metoprolol tartrate  100 mg Oral BID  . minoxidil  2.5 mg Oral BID  . pantoprazole  40 mg Oral Daily  . pravastatin  20 mg Oral Daily  . sevelamer carbonate  1,600 mg Oral With snacks  . sevelamer carbonate  2,400 mg Oral TID WC     Assessment/ Plan:  64 y.o. male with ESRD on HD TTS, history of multiple lung nodules, history of FSGS, anemia chronic kidney disease, secondary hyperparathyroidism, Barrett's esophagus,, history of renal transplantation in the pastwho was admitted to [facility] with: Active Problems:   Acute hypoxemic respiratory failure ([place])  [facility]/[facility]  #. ESRD with acute pulmonary edema, malignant hypertension We will arrange for repeat hemodialysis today UF goal ~ 2 kg as tolerated  #. Anemia of CKD  Lab Results  Component Value  Date   HGB 10.3 (L) 09/18/2019   Low dose EPO with HD once blood pressure improves and hemoglobin is less than 10  #. SHPTH     Component Value Date/Time   PTH 93 (H) 04/09/2019 4862   Lab Results  Component Value Date   PHOS 5.9 (H) 09/17/2019   Monitor calcium and phos level during this admission Phosphorus mildly elevated  #. HTN  Continue home medications Changed ACE inhibitor to ARB.  Dosed at night. Blood pressure control had improved but high again today at 207/61 Repeat evaluation after volume removal and after starting ARB.    LOS: [place] 2/9/20219:04 [place], [place]

## 2019-09-18 NOTE — Progress Notes (Signed)
HD Tx Completed    09/18/19 1420  Vital Signs  Pulse Rate (!) 57  Resp 18  BP (!) 201/71  Oxygen Therapy  SpO2 100 %  O2 Device Nasal Cannula  O2 Flow Rate (L/min) 4 L/min  During Hemodialysis Assessment  Blood Flow Rate (mL/min) 400 mL/min  Arterial Pressure (mmHg) -120 mmHg  Venous Pressure (mmHg) 140 mmHg  Transmembrane Pressure (mmHg) 60 mmHg  Ultrafiltration Rate (mL/min) 830 mL/min  Dialysate Flow Rate (mL/min) 600 ml/min  Conductivity: Machine  15.4  HD Safety Checks Performed Yes  Intra-Hemodialysis Comments Tx completed;Tolerated well

## 2019-09-18 NOTE — Progress Notes (Signed)
Post HD Tx Assessment   09/18/19 1420  Neurological  Level of Consciousness Alert  Orientation Level Oriented X4  Respiratory  Respiratory Pattern Regular;Unlabored  Chest Assessment Chest expansion symmetrical  Bilateral Breath Sounds Clear;Diminished  Cardiac  Pulse Irregular  ECG Monitor Yes  Vascular  R Radial Pulse +2  L Radial Pulse +2  Integumentary  Integumentary (WDL) WDL  Musculoskeletal  Musculoskeletal (WDL) X  Generalized Weakness Yes  Gastrointestinal  Bowel Sounds Assessment Active  Last BM Date 09/17/19  GU Assessment  Genitourinary (WDL) X  Genitourinary Symptoms Anuria (ESRD Pt )  Psychosocial  Psychosocial (WDL) WDL

## 2019-09-18 NOTE — Progress Notes (Addendum)
Progress Note    JAMIS KRYDER  YIA:[id number] DOB: May 05, 1956  DOA: 09/17/2019 PCP: Franciso Bend, MD      Brief Narrative:    Medical records reviewed and are as summarized below:  [name] is an 64 y.o. male EMILLIO NGO is an 64 y.o. male with medical history significant for Barrett's esophagus, end-stage renal disease on hemodialysis, COPD, seizure disorder, hypertension, hyperparathyroidism, who presented to the hospital because of sudden onset of shortness of breath that started this morning.  He said he was well until this morning when he noticed that he had trouble breathing.  It is progressively worsened so he came to the emergency room for further evaluation.  Shortness of breath was associated with wheezing and cough which also started today.  Shortness of breath is worse with exertion.  He feels as though he has phlegm stuck in his throat. He said he was admitted to the hospital in September 2020 for similar symptoms.  He is a known hypertensive but he said that his blood pressure has been uncontrolled for almost 6 weeks now.  He said he was started on a new blood pressure pill not too long ago but it has not helped with his blood pressure.  His systolic blood pressure sometimes runs in the 180s.      Assessment/Plan:   Active Problems:   Acute hypoxemic respiratory failure (HCC)   Acute pulmonary edema (HCC)   ESRD on hemodialysis ([facility])   Hypertensive emergency   Acute pulmonary edema/fluid overload from hypertensive emergency and ESRD/hyperkalemia: Patient had hemodialysis yesterday with improvement in symptoms and hypoxemia.  Potassium and creatinine trending down.  BP still uncontrolled.  Plan for repeat hemodialysis today. Repeat chest x-ray today after hemodialysis. Continue antihypertensives.  Follow-up with nephrologist. Chart review showed that 2D echo in August 2020 revealed EF estimated at 60 to 65% and there was no evidence of diastolic  dysfunction.  Bilateral pleural effusions: This is likely due to fluid overload.  Repeat chest x-ray today.  Acute hypoxemic respiratory failure: Improved.  Patient was on 4 L/min oxygen via nasal cannula this morning.  Taper off oxygen as able.  Seizure disorder: He says that he no longer takes Keppra.  He said he had seizures in 2015 and at that time his blood pressure was very high.  He was prescribed Keppra but he said he has not taken Keppra for about 5 years.  Discontinue Keppra.   Body mass index is 21.72 kg/m.   Family Communication/Anticipated D/C date and plan/Code Status   DVT prophylaxis: Heparin Code Status: Full code Family Communication: Plan discussed with the patient Disposition Plan: Possible discharge to home tomorrow if BP is controlled and hypoxemia resolves.      Subjective:   No fever, cough, wheezing, chest pain or shortness of breath.  Objective:    Vitals:   09/18/19 1245 09/18/19 1300 09/18/19 1315 09/18/19 1330  BP: (!) 194/66 (!) 191/64 (!) 196/69 (!) 191/66  Pulse: 61 (!) 57 (!) 57 (!) 58  Resp: 18 14 18 17   Temp:      TempSrc:      SpO2: 100% 100% 100% 100%  Weight:      Height:        Intake/Output Summary (Last 24 hours) at 09/18/2019 1345 Last data filed at 09/18/2019 0915 Gross per 24 hour  Intake 240 ml  Output 2000 ml  Net -1760 ml   [employer]   09/17/19  2129 09/18/19 0427 09/18/19 1102  Weight: 66.3 kg 66.8 kg 66.7 kg    Exam:  GEN: NAD SKIN: No rash EYES: EOMI ENT: MMM CV: RRR PULM: CTA B ABD: soft, ND, NT, +BS CNS: AAO x 3, non focal EXT: No edema or tenderness.  Bruit on thrill noted on left arm AV fistula   Data Reviewed:   I have personally reviewed following labs and imaging studies:  Labs: Labs show the following:   Basic Metabolic Panel: Recent Labs  Lab 09/17/19 1723 09/18/19 0430  NA 135 140  K 5.7* 5.4*  CL 94* 97*  CO2 25 34*  GLUCOSE 85 102*  BUN 60* 39*  CREATININE 9.48* 7.01*   CALCIUM 9.0 9.0  PHOS 5.9*  --    GFR Estimated Creatinine Clearance: 10 mL/min (A) (by C-G formula based on SCr of 7.01 mg/dL (H)). Liver Function Tests: No results for input(s): AST, ALT, ALKPHOS, BILITOT, PROT, ALBUMIN in the last 168 hours. No results for input(s): LIPASE, AMYLASE in the last 168 hours. No results for input(s): AMMONIA in the last 168 hours. Coagulation profile No results for input(s): INR, PROTIME in the last 168 hours.  CBC: Recent Labs  Lab 09/17/19 1555 09/18/19 0430  WBC 13.5* 5.6  NEUTROABS 11.2*  --   HGB 12.2* 10.3*  HCT 39.0 32.0*  MCV 100.0 99.1  PLT 208 175   Cardiac Enzymes: No results for input(s): CKTOTAL, CKMB, CKMBINDEX, TROPONINI in the last 168 hours. BNP (last 3 results) No results for input(s): PROBNP in the last 8760 hours. CBG: No results for input(s): GLUCAP in the last 168 hours. D-Dimer: No results for input(s): DDIMER in the last 72 hours. Hgb A1c: No results for input(s): HGBA1C in the last 72 hours. Lipid Profile: No results for input(s): CHOL, HDL, LDLCALC, TRIG, CHOLHDL, LDLDIRECT in the last 72 hours. Thyroid function studies: No results for input(s): TSH, T4TOTAL, T3FREE, THYROIDAB in the last 72 hours.  Invalid input(s): FREET3 Anemia work up: No results for input(s): VITAMINB12, FOLATE, FERRITIN, TIBC, IRON, RETICCTPCT in the last 72 hours. Sepsis Labs: Recent Labs  Lab 09/17/19 1555 09/18/19 0430  WBC 13.5* 5.6    Microbiology Recent Results (from the past 240 hour(s))  Respiratory Panel by RT PCR (Flu A&B, Covid) - Nasopharyngeal Swab     Status: None   Collection Time: 09/17/19  4:02 PM   Specimen: Nasopharyngeal Swab  Result Value Ref Range Status   SARS Coronavirus 2 by RT PCR NEGATIVE NEGATIVE Final    Comment: (NOTE) SARS-CoV-2 target nucleic acids are NOT DETECTED. The SARS-CoV-2 RNA is generally detectable in upper respiratoy specimens during the acute phase of infection. The  lowest concentration of SARS-CoV-2 viral copies this assay can detect is 131 copies/mL. A negative result does not preclude SARS-Cov-2 infection and should not be used as the sole basis for treatment or other patient management decisions. A negative result may occur with  improper specimen collection/handling, submission of specimen other than nasopharyngeal swab, presence of viral mutation(s) within the areas targeted by this assay, and inadequate number of viral copies (<131 copies/mL). A negative result must be combined with clinical observations, patient history, and epidemiological information. The expected result is Negative. Fact Sheet for Patients:  PinkCheek.be Fact Sheet for Healthcare Providers:  GravelBags.it This test is not yet ap proved or cleared by the [place] FDA and  has been authorized for detection and/or diagnosis of SARS-CoV-2 by FDA under an Emergency Use Authorization (EUA). This  EUA will remain  in effect (meaning this test can be used) for the duration of the COVID-19 declaration under Section 564(b)(1) of the Act, 21 U.S.C. section 360bbb-3(b)(1), unless the authorization is terminated or revoked sooner.    Influenza A by PCR NEGATIVE NEGATIVE Final   Influenza B by PCR NEGATIVE NEGATIVE Final    Comment: (NOTE) The Xpert Xpress SARS-CoV-2/FLU/RSV assay is intended as an aid in  the diagnosis of influenza from Nasopharyngeal swab specimens and  should not be used as a sole basis for treatment. Nasal washings and  aspirates are unacceptable for Xpert Xpress SARS-CoV-2/FLU/RSV  testing. Fact Sheet for Patients: PinkCheek.be Fact Sheet for Healthcare Providers: GravelBags.it This test is not yet approved or cleared by the [place] FDA and  has been authorized for detection and/or diagnosis of SARS-CoV-2 by  FDA under an Emergency  Use Authorization (EUA). This EUA will remain  in effect (meaning this test can be used) for the duration of the  Covid-19 declaration under Section 564(b)(1) of the Act, 21  U.S.C. section 360bbb-3(b)(1), unless the authorization is  terminated or revoked. Performed at [facility], [address], [place], [place] [address]     Procedures and diagnostic studies:  DG Chest [facility] 1 View  Result Date: 09/17/2019 CLINICAL DATA:  64 year old male with shortness of breath. EXAM: PORTABLE CHEST 1 VIEW COMPARISON:  Chest radiograph dated 04/09/2019. FINDINGS: There is cardiomegaly with vascular congestion and edema. Bilateral pleural effusions, left greater right with associated mid to lower lung field opacities which may represent atelectasis or pneumonia. Overall the appearance of the lungs is similar to prior radiograph. There is no pneumothorax. Atherosclerotic calcification of the aorta. No acute osseous pathology. Left axillary vascular stent. IMPRESSION: 1. Cardiomegaly with vascular congestion and edema 2. Bilateral pleural effusions, left greater right with bilateral airspace opacities concerning for developing pneumonia. Clinical correlation and follow-up recommended. Electronically Signed   By: Anner Crete M.D.   On: 09/17/2019 16:27    Medications:   . aspirin EC  81 mg Oral Daily  . Chlorhexidine Gluconate Cloth  6 each Topical Q0600  . cinacalcet  60 mg Oral Q breakfast  . cloNIDine  0.3 mg Oral TID  . heparin  5,000 Units Subcutaneous Q8H  . irbesartan  150 mg Oral QHS  . labetalol  20 mg Intravenous Once  . metoprolol tartrate  100 mg Oral BID  . minoxidil  2.5 mg Oral BID  . pantoprazole  40 mg Oral Daily  . pravastatin  20 mg Oral Daily  . sevelamer carbonate  1,600 mg Oral With snacks  . sevelamer carbonate  2,400 mg Oral TID WC   Continuous Infusions:   LOS: 1 day   Adelbert Gaspard  [facility]     09/18/2019, 1:45 PM

## 2019-09-18 NOTE — Progress Notes (Signed)
Pre HD Tx Note Pt arrived from his room to receive HD Tx. Pt is A*4. On 4L O2 per [place] . SPO2 100%. 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 Pt reports no SOB or chest pain. BP WDL to start  09/18/19 1102  Hand-Off documentation  Report given to (Full Name) Newt Minion RN   Report received from (Full Name) Coralee North RN   Vital Signs  Temp 99.2 F (37.3 C)  Temp Source Oral  Pulse Rate 70  Resp 19  BP (!) 151/67  BP Location Right Arm  BP Method Automatic  Patient Position (if appropriate) Lying  Oxygen Therapy  SpO2 100 %  O2 Device Nasal Cannula  O2 Flow Rate (L/min) 4 L/min  Pain Assessment  Pain Scale 0-10  Pain Score 0  Dialysis Weight  Weight 66.7 kg  Type of Weight Pre-Dialysis  Time-Out for Hemodialysis  What Procedure? HD  Pt Identifiers(min of two) First/Last Name;MRN/Account#  Correct Site? Yes  Correct Side? Yes  Correct Procedure? Yes  Consents Verified? Yes  Safety Precautions Reviewed? Yes  Engineer, civil (consulting) Number 7  Station Number 4  UF/Alarm Test Passed  Conductivity: Meter 14  Conductivity: Machine  14.1  pH 7.2  Reverse Osmosis Main  Normal Saline Lot Number [id number]  Dialyzer Lot Number [id number]  Disposable Set Lot Number [id number]  Dialysate Acid Bath Lot Number [id number]  Dialysate HCO3 Bath Lot Number [id number]  Machine Temperature 98.6 F (37 C)  Musician and Audible Yes  Blood Lines Intact and Secured Yes  Pre Treatment Patient Checks  Vascular access used during treatment Graft  HD catheter dressing before treatment WDL  Patient is receiving dialysis in a chair  (In Bed)  Hepatitis B Surface Antigen Results Negative  Date Hepatitis B Surface Antigen Drawn 09/17/19  Hepatitis B Surface Antibody  (<10)  Date Hepatitis B Surface Antibody Drawn 09/17/19  Hemodialysis Consent Verified Yes  Hemodialysis Standing Orders Initiated Yes  ECG (Telemetry) Monitor On Yes  Prime Ordered Normal Saline   Length of  DialysisTreatment -hour(s) 3 Hour(s)  Dialysis Treatment Comments  (Na140)  Dialyzer Elisio 17H NR  Dialysate 2K;2.5 Ca  Dialysate Flow Ordered 600  Blood Flow Rate Ordered 400 mL/min  Ultrafiltration Goal 2 Liters  Pre Treatment Labs Phosphorus  Dialysis Blood Pressure Support Ordered Albumin  Education / Care Plan  Dialysis Education Provided Yes  Documented Education in Care Plan Yes  Fistula / Graft Left Upper arm  Placement Date/Time: 04/09/19 1846   Placed prior to admission: Yes  Orientation: Left  Access Location: Upper arm  Site Condition No complications  Fistula / Graft Assessment Thrill;Bruit;Present  Status Accessed  Needle Size 15  Drainage Description None   Tx

## 2019-09-18 NOTE — Progress Notes (Signed)
Pre HD Tx Assessment   09/18/19 1110  Neurological  Level of Consciousness Alert  Orientation Level Oriented X4  Respiratory  Respiratory Pattern Regular;Accessory muscle use  Chest Assessment Chest expansion symmetrical  Bilateral Breath Sounds Clear;Diminished  R Upper  Breath Sounds Clear  L Upper Breath Sounds Clear;Diminished  R Lower Breath Sounds Diminished  L Lower Breath Sounds Diminished  Cough None  Cardiac  Pulse Irregular  ECG Monitor Yes  Vascular  R Radial Pulse +2  L Radial Pulse +2  Integumentary  Integumentary (WDL) WDL  Musculoskeletal  Musculoskeletal (WDL) X  Generalized Weakness Yes  Gastrointestinal  Bowel Sounds Assessment Active  Last BM Date 09/17/19  GU Assessment  Genitourinary (WDL) X  Genitourinary Symptoms Anuria (ESRD Pt )  Psychosocial  Psychosocial (WDL) WDL

## 2019-09-18 NOTE — Progress Notes (Signed)
Pt back from dialysis with 2L removed per nurse. Pt BP is still high but not as bad as on arrival. Pharmacy consulted on meds to give. BP meds given and keppra held at this time. Per pt he does not take Keppra and he is not familiar with it at all. Pharmacy made aware and will do a med rec with pt. Pt troponin reported increase to 198. NP made aware

## 2019-09-18 NOTE — Progress Notes (Signed)
HD Tx initiated    09/18/19 1115  Vital Signs  Pulse Rate 70  Resp 19  BP (!) 186/81  Oxygen Therapy  SpO2 100 %  O2 Device Simple Mask  O2 Flow Rate (L/min) 4 L/min  During Hemodialysis Assessment  Blood Flow Rate (mL/min) 400 mL/min  Arterial Pressure (mmHg) -150 mmHg  Venous Pressure (mmHg) 120 mmHg  Transmembrane Pressure (mmHg) 80 mmHg  Ultrafiltration Rate (mL/min) 830 mL/min  Dialysate Flow Rate (mL/min) 600 ml/min  Conductivity: Machine  13.5  HD Safety Checks Performed Yes  Dialysis Fluid Bolus Normal Saline  Bolus Amount (mL) 250 mL  Intra-Hemodialysis Comments Tx initiated

## 2019-09-18 NOTE — Progress Notes (Signed)
Post HD Tx Note Pt tolerated well the HD Tx. Pt continues to be on 4 L O2 per [place] . Pt reports no SOB or Chest pain pt tx run for 3hrs on 2K2.5Ca   09/18/19 1430  Hand-Off documentation  Report given to (Full Name) Coralee North RN   Report received from (Full Name) Newt Minion RN   Vital Signs  Temp 98 F (36.7 C)  Temp Source Oral  Pulse Rate 62  Pulse Rate Source Monitor  Resp (!) 23  BP (!) 212/70  BP Location Right Arm  BP Method Automatic  Patient Position (if appropriate) Lying  Oxygen Therapy  SpO2 100 %  O2 Device Nasal Cannula  O2 Flow Rate (L/min) 4 L/min  Post-Hemodialysis Assessment  Rinseback Volume (mL) 250 mL  KECN 69.4 V  Dialyzer Clearance Lightly streaked  Duration of HD Treatment -hour(s) 3 hour(s)  Hemodialysis Intake (mL) 500 mL  UF Total -Machine (mL) 2500 mL  Net UF (mL) 2000 mL  Tolerated HD Treatment Yes  AVG/AVF Arterial Site Held (minutes) 6 minutes  AVG/AVF Venous Site Held (minutes) 5 minutes  Fistula / Graft Left Upper arm  Placement Date/Time: 04/09/19 1846   Placed prior to admission: Yes  Orientation: Left  Access Location: Upper arm  Site Condition No complications  Fistula / Graft Assessment Thrill;Bruit;Present  Status Deaccessed  Needle Size 15  Drainage Description None

## 2019-09-18 NOTE — Plan of Care (Signed)
  Problem: Clinical Measurements: Goal: Ability to maintain clinical measurements within normal limits will improve Outcome: Progressing Goal: Will remain free from infection Outcome: Progressing Goal: Diagnostic test results will improve Outcome: Progressing Goal: Respiratory complications will improve Outcome: Progressing Goal: Cardiovascular complication will be avoided Outcome: Progressing  HD Tx progressing W/out complications

## 2019-09-19 DIAGNOSIS — E875 Hyperkalemia: Secondary | ICD-10-CM

## 2019-09-19 LAB — POTASSIUM: Potassium: 5 mmol/L (ref 3.5–5.1)

## 2019-09-19 MED ORDER — IRBESARTAN 150 MG PO TABS: 150.0000 mg | ORAL_TABLET | Freq: Every day | ORAL | 0 refills | Status: DC

## 2019-09-19 NOTE — Plan of Care (Signed)
Discharge instructions provided to pt.  All questions addressed.  Understanding verified through teach back.  Awaiting transportation home via taxi.   Problem: Education: Goal: Knowledge of General Education information will improve Description: Including pain rating scale, medication(s)/side effects and non-pharmacologic comfort measures Outcome: Adequate for Discharge   Problem: Health Behavior/Discharge Planning: Goal: Ability to manage health-related needs will improve Outcome: Adequate for Discharge   Problem: Clinical Measurements: Goal: Ability to maintain clinical measurements within normal limits will improve Outcome: Adequate for Discharge Goal: Will remain free from infection Outcome: Adequate for Discharge Goal: Diagnostic test results will improve Outcome: Adequate for Discharge Goal: Respiratory complications will improve Outcome: Adequate for Discharge Goal: Cardiovascular complication will be avoided Outcome: Adequate for Discharge   Problem: Activity: Goal: Risk for activity intolerance will decrease Outcome: Adequate for Discharge   Problem: Nutrition: Goal: Adequate nutrition will be maintained Outcome: Adequate for Discharge   Problem: Coping: Goal: Level of anxiety will decrease Outcome: Adequate for Discharge   Problem: Elimination: Goal: Will not experience complications related to bowel motility Outcome: Adequate for Discharge Goal: Will not experience complications related to urinary retention Outcome: Adequate for Discharge   Problem: Pain Managment: Goal: General experience of comfort will improve Outcome: Adequate for Discharge   Problem: Safety: Goal: Ability to remain free from injury will improve Outcome: Adequate for Discharge   Problem: Skin Integrity: Goal: Risk for impaired skin integrity will decrease Outcome: Adequate for Discharge

## 2019-09-19 NOTE — Progress Notes (Signed)
[place], [place] 09/19/19  Subjective:   LOS: 2 02/09 0701 - 02/10 0700 In: 600 [P.O.:600] Out: 2000   Patient feels better after dialysis yesterday Today patient is on room air. Another 2000 cc of fluid was removed with hemodialysis Standing weight of 64.4 kg  Objective:  Vital signs in last 24 hours:  Temp:  [97.8 F (36.6 C)-99.1 F (37.3 C)] 98.4 F (36.9 C) (02/10 0723) Pulse Rate:  [53-62] 53 (02/10 0723) Resp:  [14-23] 18 (02/10 0723) BP: (119-212)/(57-71) 138/61 (02/10 0723) SpO2:  [100 %] 100 % (02/10 0723) Weight:  [64.4 kg-65.6 kg] 64.4 kg (02/10 1000)  Weight change: -0.3 kg Filed Weights   09/18/19 1102 09/19/19 0502 09/19/19 1000  Weight: 66.7 kg 65.6 kg 64.4 kg    Intake/Output:    Intake/Output Summary (Last 24 hours) at 09/19/2019 1148 Last data filed at 09/19/2019 0106 Gross per 24 hour  Intake 360 ml  Output 2000 ml  Net -1640 ml     Physical Exam: General:  No acute distress, laying in the bed  HEENT  anicteric, moist oral mucous membranes  Pulm/lungs  normal breathing effort on room air, clear to auscultation  CVS/Heart  regular, no rub  Abdomen:   Soft, nontender  Extremities:  Trace peripheral edema  Neurologic:  Alert, oriented  Skin:  Scattered ecchymosis  Access:  AV fistula       Basic Metabolic Panel:  Recent Labs  Lab 09/17/19 1723 09/18/19 0430 09/19/19 0930  NA 135 140  --   K 5.7* 5.4* 5.0  CL 94* 97*  --   CO2 25 34*  --   GLUCOSE 85 102*  --   BUN 60* 39*  --   CREATININE 9.48* 7.01*  --   CALCIUM 9.0 9.0  --   PHOS 5.9*  --   --      CBC: Recent Labs  Lab 09/17/19 1555 09/18/19 0430  WBC 13.5* 5.6  NEUTROABS 11.2*  --   HGB 12.2* 10.3*  HCT 39.0 32.0*  MCV 100.0 99.1  PLT 208 175      Lab Results  Component Value Date   HEPBSAG NON REACTIVE 09/17/2019   HEPBIGM NON REACTIVE 09/17/2019      Microbiology:  Recent Results (from the past 240 hour(s))   Respiratory Panel by RT PCR (Flu A&B, Covid) - Nasopharyngeal Swab     Status: None   Collection Time: 09/17/19  4:02 PM   Specimen: Nasopharyngeal Swab  Result Value Ref Range Status   SARS Coronavirus 2 by RT PCR NEGATIVE NEGATIVE Final    Comment: (NOTE) SARS-CoV-2 target nucleic acids are NOT DETECTED. The SARS-CoV-2 RNA is generally detectable in upper respiratoy specimens during the acute phase of infection. The lowest concentration of SARS-CoV-2 viral copies this assay can detect is 131 copies/mL. A negative result does not preclude SARS-Cov-2 infection and should not be used as the sole basis for treatment or other patient management decisions. A negative result may occur with  improper specimen collection/handling, submission of specimen other than nasopharyngeal swab, presence of viral mutation(s) within the areas targeted by this assay, and inadequate number of viral copies (<131 copies/mL). A negative result must be combined with clinical observations, patient history, and epidemiological information. The expected result is Negative. Fact Sheet for Patients:  PinkCheek.be Fact Sheet for Healthcare Providers:  GravelBags.it This test is not yet ap proved or cleared by the [place] and  has been authorized  for detection and/or diagnosis of SARS-CoV-2 by FDA under an Emergency Use Authorization (EUA). This EUA will remain  in effect (meaning this test can be used) for the duration of the COVID-19 declaration under Section 564(b)(1) of the Act, 21 U.S.C. section 360bbb-3(b)(1), unless the authorization is terminated or revoked sooner.    Influenza A by PCR NEGATIVE NEGATIVE Final   Influenza B by PCR NEGATIVE NEGATIVE Final    Comment: (NOTE) The Xpert Xpress SARS-CoV-2/FLU/RSV assay is intended as an aid in  the diagnosis of influenza from Nasopharyngeal swab specimens and  should not be used as a sole  basis for treatment. Nasal washings and  aspirates are unacceptable for Xpert Xpress SARS-CoV-2/FLU/RSV  testing. Fact Sheet for Patients: PinkCheek.be Fact Sheet for Healthcare Providers: GravelBags.it This test is not yet approved or cleared by the [place] FDA and  has been authorized for detection and/or diagnosis of SARS-CoV-2 by  FDA under an Emergency Use Authorization (EUA). This EUA will remain  in effect (meaning this test can be used) for the duration of the  Covid-19 declaration under Section 564(b)(1) of the Act, 21  U.S.C. section 360bbb-3(b)(1), unless the authorization is  terminated or revoked. Performed at [facility], [address] [place], [place] [address]     Coagulation Studies: No results for input(s): LABPROT, INR in the last 72 hours.  Urinalysis: No results for input(s): COLORURINE, LABSPEC, PHURINE, GLUCOSEU, HGBUR, BILIRUBINUR, KETONESUR, PROTEINUR, UROBILINOGEN, NITRITE, LEUKOCYTESUR in the last 72 hours.  Invalid input(s): APPERANCEUR    Imaging: DG Chest 2 View  Result Date: 09/18/2019 CLINICAL DATA:  Shortness of breath EXAM: CHEST - 2 VIEW COMPARISON:  09/17/2019 FINDINGS: Cardiomegaly. Bilateral pleural effusions, large on the left and small on the right. Bilateral airspace disease in the mid and lower lungs, improved overall since prior study. Underlying COPD. No acute bony abnormality. IMPRESSION: Improving bilateral airspace disease. Continued bilateral effusions, left larger than right with left mid and lower lung airspace disease. Cardiomegaly, COPD. Electronically Signed   By: Rolm Baptise M.D.   On: 09/18/2019 16:19   DG Chest Port 1 View  Result Date: 09/17/2019 CLINICAL DATA:  64 year old male with shortness of breath. EXAM: PORTABLE CHEST 1 VIEW COMPARISON:  Chest radiograph dated 04/09/2019. FINDINGS: There is cardiomegaly with vascular congestion and edema.  Bilateral pleural effusions, left greater right with associated mid to lower lung field opacities which may represent atelectasis or pneumonia. Overall the appearance of the lungs is similar to prior radiograph. There is no pneumothorax. Atherosclerotic calcification of the aorta. No acute osseous pathology. Left axillary vascular stent. IMPRESSION: 1. Cardiomegaly with vascular congestion and edema 2. Bilateral pleural effusions, left greater right with bilateral airspace opacities concerning for developing pneumonia. Clinical correlation and follow-up recommended. Electronically Signed   By: Anner Crete M.D.   On: 09/17/2019 16:27     Medications:    . aspirin EC  81 mg Oral Daily  . Chlorhexidine Gluconate Cloth  6 each Topical Q0600  . cinacalcet  60 mg Oral Q breakfast  . cloNIDine  0.3 mg Oral TID  . [START ON 09/20/2019] epoetin (EPOGEN/PROCRIT) injection  4,000 Units Intravenous Q T,Th,Sa-HD  . heparin  5,000 Units Subcutaneous Q8H  . irbesartan  150 mg Oral QHS  . labetalol  20 mg Intravenous Once  . metoprolol tartrate  100 mg Oral BID  . minoxidil  2.5 mg Oral BID  . pantoprazole  40 mg Oral Daily  . pravastatin  20 mg  Oral Daily  . sevelamer carbonate  1,600 mg Oral With snacks  . sevelamer carbonate  2,400 mg Oral TID WC     Assessment/ Plan:  64 y.o. male with ESRD on HD TTS, history of multiple lung nodules, history of FSGS, anemia chronic kidney disease, secondary hyperparathyroidism, Barrett's esophagus,, history of renal transplantation in the pastwho was admitted to [facility] with: Active Problems:   Acute hypoxemic respiratory failure ([place])   Acute pulmonary edema ([facility])   End stage renal disease on dialysis [facility])   Hypertensive emergency   Hyperkalemia  [facility]/TTHS  #. ESRD with acute pulmonary edema, malignant hypertension Much improved after 4 L removed Weight this morning 64.4 kg standing  #. Anemia of CKD  Lab Results  Component  Value Date   HGB 10.3 (L) 09/18/2019   May continue Epogen as per outpatient protocol and his dialysis center  #. SHPTH     Component Value Date/Time   PTH 93 (H) 04/09/2019 5732   Lab Results  Component Value Date   PHOS 5.9 (H) 09/17/2019   Monitor calcium and phos level during this admission Phosphorus mildly elevated  #. HTN  Continue home medications Changed ACE inhibitor to ARB.  Dosed at night. Blood pressure control overall has improved.  #Hyperkalemia Normal this morning    LOS: 2 Scott Castro Candiss Norse 2/10/202111:48 AM  [facility] [place], [place]

## 2019-09-19 NOTE — Discharge Summary (Signed)
[place] at [place] NAME: Scott Castro    MR#:  [id number]  DATE OF BIRTH:  1956/06/03  DATE OF ADMISSION:  09/17/2019 ADMITTING PHYSICIAN: Jennye Boroughs, MD  DATE OF DISCHARGE: 09/19/2019  PRIMARY CARE PHYSICIAN: Franciso Bend, MD    ADMISSION DIAGNOSIS:  Acute pulmonary edema ([place]) [J81.0] Hypoxia [R09.02] End stage renal disease on dialysis ([place]) [N18.6, Z99.2] Acute hypoxemic respiratory failure ([place]) [J96.01]  DISCHARGE DIAGNOSIS:    SECONDARY DIAGNOSIS:   Past Medical History:  Diagnosis Date  . Barrett's esophagus   . Chronic kidney disease    CHRONIC\  . Glomerulosclerosis, focal   . Hyperparathyroidism due to renal insufficiency [facility])     HOSPITAL COURSE:  Scott Castro is an 64 y.o. male Scott Castro an 64 y.o.malewith medical history significant for Barrett's esophagus, end-stage renal disease on hemodialysis, COPD, seizure disorder, hypertension, hyperparathyroidism, who presented to the hospital because of sudden onset of shortness of breath that started on the morning of admission.  Acute pulmonary edema/fluid overload from hypertensive emergency  -BP much improved after 2 sessions for HD with UF of >4L -cont irbesartan (d/ced enalapril),amlodipine, minoxidil, BB and prn clonidine -ppt feesl back to baseline -advised fluid restriction -sata >99% on RA  ESRD/hyperkalemia:  -Patient had hemodialysis yesterday with improvement in symptoms and hypoxemia.   -Potassium 5.0  -Continue antihypertensives.   -ok with Dr Candiss Norse for pt to discharge -2D echo in August 2020 revealedEF estimated at 18 to 65% and there was no evidence of diastolic dysfunction. -cont sevelamer and cinacalet  Bilateral pleural effusions: This is likely due to fluid overload.    Seizure disorder: He says that he no longer takes Keppra.  He said he had seizures in 2015 and at that time his blood pressure was very high.  He was prescribed  Keppra but he said he has not taken Keppra for about 5 years.  Discontinue Keppra.  H/o Barrette's esophagus on ppi  D/c home  DVT prophylaxis: Heparin Code Status: Full code Family Communication: Plan discussed with the patient Disposition Plan: d/c today  CONSULTS OBTAINED:  Treatment Team:  Murlean Iba, MD  DRUG ALLERGIES:  No Known Allergies  DISCHARGE MEDICATIONS:   Allergies as of 09/19/2019   No Known Allergies     Medication List    STOP taking these medications   enalapril 20 MG tablet Commonly known as: VASOTEC   esomeprazole 40 MG capsule Commonly known as: [place] these medications   amLODipine 10 MG tablet Commonly known as: NORVASC Take 10 mg by mouth daily at 6 (six) AM.   cinacalcet 30 MG tablet Commonly known as: SENSIPAR Take 60 mg by mouth daily.   cloNIDine 0.3 MG tablet Commonly known as: CATAPRES Take 0.3 mg by mouth 3 (three) times daily.   irbesartan 150 MG tablet Commonly known as: AVAPRO Take 1 tablet (150 mg total) by mouth at bedtime.   metoprolol tartrate 100 MG tablet Commonly known as: LOPRESSOR Take 100 mg by mouth 2 (two) times daily.   minoxidil 2.5 MG tablet Commonly known as: LONITEN Take by mouth 2 (two) times daily.   pantoprazole 40 MG tablet Commonly known as: PROTONIX Take 40 mg by mouth daily.   pravastatin 20 MG tablet Commonly known as: PRAVACHOL Take 20 mg by mouth daily.   sevelamer carbonate 800 MG tablet Commonly known as: RENVELA Take by mouth See admin instructions. Take 2400 mg by  mouth three times daily, 1600 mg with snacks       If you experience worsening of your admission symptoms, develop shortness of breath, life threatening emergency, suicidal or homicidal thoughts you must seek medical attention immediately by calling 911 or calling your MD immediately  if symptoms less severe.  You Must read complete instructions/literature along with all the possible adverse  reactions/side effects for all the Medicines you take and that have been prescribed to you. Take any new Medicines after you have completely understood and accept all the possible adverse reactions/side effects.   Please note  You were cared for by a hospitalist during your hospital stay. If you have any questions about your discharge medications or the care you received while you were in the hospital after you are discharged, you can call the unit and asked to speak with the hospitalist on call if the hospitalist that took care of you is not available. Once you are discharged, your primary care physician will handle any further medical issues. Please note that NO REFILLS for any discharge medications will be authorized once you are discharged, as it is imperative that you return to your primary care physician (or establish a relationship with a primary care physician if you do not have one) for your aftercare needs so that they can reassess your need for medications and monitor your lab values. Today   SUBJECTIVE   Doing well  VITAL SIGNS:  Blood pressure 138/61, pulse (!) 53, temperature 98.4 F (36.9 C), temperature source Oral, resp. rate 18, height 5\' 9"  (1.753 m), weight 64.4 kg, SpO2 100 %.  I/O:    Intake/Output Summary (Last 24 hours) at 09/19/2019 1020 Last data filed at 09/19/2019 0106 Gross per 24 hour  Intake 360 ml  Output 2000 ml  Net -1640 ml    PHYSICAL EXAMINATION:  GENERAL:  64 y.o.-year-old patient lying in the bed with no acute distress. Thin EYES: Pupils equal, round, reactive to light and accommodation. No scleral icterus.  HEENT: Head atraumatic, normocephalic. Oropharynx and nasopharynx clear.  NECK:  Supple, no jugular venous distention. No thyroid enlargement, no tenderness.  LUNGS: Normal breath sounds bilaterally, no wheezing, rales,rhonchi or crepitation. No use of accessory muscles of respiration.  CARDIOVASCULAR: S1, S2 normal. No murmurs, rubs, or  gallops.  ABDOMEN: Soft, non-tender, non-distended. Bowel sounds present. No organomegaly or mass.  EXTREMITIES: No pedal edema, cyanosis, or clubbing.  NEUROLOGIC: Cranial nerves II through XII are intact. Muscle strength 5/5 in all extremities. Sensation intact. Gait not checked.  PSYCHIATRIC: The patient is alert and oriented x 3.  SKIN: No obvious rash, lesion, or ulcer.   DATA REVIEW:   CBC  Recent Labs  Lab 09/18/19 0430  WBC 5.6  HGB 10.3*  HCT 32.0*  PLT 175    Chemistries  Recent Labs  Lab 09/18/19 0430 09/18/19 0430 09/19/19 0930  NA 140  --   --   K 5.4*   < > 5.0  CL 97*  --   --   CO2 34*  --   --   GLUCOSE 102*  --   --   BUN 39*  --   --   CREATININE 7.01*  --   --   CALCIUM 9.0  --   --    < > = values in this interval not displayed.    Microbiology Results   Recent Results (from the past 240 hour(s))  Respiratory Panel by RT PCR (Flu A&B, Covid) -  Nasopharyngeal Swab     Status: None   Collection Time: 09/17/19  4:02 PM   Specimen: Nasopharyngeal Swab  Result Value Ref Range Status   SARS Coronavirus 2 by RT PCR NEGATIVE NEGATIVE Final    Comment: (NOTE) SARS-CoV-2 target nucleic acids are NOT DETECTED. The SARS-CoV-2 RNA is generally detectable in upper respiratoy specimens during the acute phase of infection. The lowest concentration of SARS-CoV-2 viral copies this assay can detect is 131 copies/mL. A negative result does not preclude SARS-Cov-2 infection and should not be used as the sole basis for treatment or other patient management decisions. A negative result may occur with  improper specimen collection/handling, submission of specimen other than nasopharyngeal swab, presence of viral mutation(s) within the areas targeted by this assay, and inadequate number of viral copies (<131 copies/mL). A negative result must be combined with clinical observations, patient history, and epidemiological information. The expected result is  Negative. Fact Sheet for Patients:  PinkCheek.be Fact Sheet for Healthcare Providers:  GravelBags.it This test is not yet ap proved or cleared by the [place] FDA and  has been authorized for detection and/or diagnosis of SARS-CoV-2 by FDA under an Emergency Use Authorization (EUA). This EUA will remain  in effect (meaning this test can be used) for the duration of the COVID-19 declaration under Section 564(b)(1) of the Act, 21 U.S.C. section 360bbb-3(b)(1), unless the authorization is terminated or revoked sooner.    Influenza A by PCR NEGATIVE NEGATIVE Final   Influenza B by PCR NEGATIVE NEGATIVE Final    Comment: (NOTE) The Xpert Xpress SARS-CoV-2/FLU/RSV assay is intended as an aid in  the diagnosis of influenza from Nasopharyngeal swab specimens and  should not be used as a sole basis for treatment. Nasal washings and  aspirates are unacceptable for Xpert Xpress SARS-CoV-2/FLU/RSV  testing. Fact Sheet for Patients: PinkCheek.be Fact Sheet for Healthcare Providers: GravelBags.it This test is not yet approved or cleared by the [place] FDA and  has been authorized for detection and/or diagnosis of SARS-CoV-2 by  FDA under an Emergency Use Authorization (EUA). This EUA will remain  in effect (meaning this test can be used) for the duration of the  Covid-19 declaration under Section 564(b)(1) of the Act, 21  U.S.C. section 360bbb-3(b)(1), unless the authorization is  terminated or revoked. Performed at [facility], [address] [place], [place] [address]     RADIOLOGY:  DG Chest 2 View  Result Date: 09/18/2019 CLINICAL DATA:  Shortness of breath EXAM: CHEST - 2 VIEW COMPARISON:  09/17/2019 FINDINGS: Cardiomegaly. Bilateral pleural effusions, large on the left and small on the right. Bilateral airspace disease in the mid and lower lungs,  improved overall since prior study. Underlying COPD. No acute bony abnormality. IMPRESSION: Improving bilateral airspace disease. Continued bilateral effusions, left larger than right with left mid and lower lung airspace disease. Cardiomegaly, COPD. Electronically Signed   By: Rolm Baptise M.D.   On: 09/18/2019 16:19   DG Chest Port 1 View  Result Date: 09/17/2019 CLINICAL DATA:  64 year old male with shortness of breath. EXAM: PORTABLE CHEST 1 VIEW COMPARISON:  Chest radiograph dated 04/09/2019. FINDINGS: There is cardiomegaly with vascular congestion and edema. Bilateral pleural effusions, left greater right with associated mid to lower lung field opacities which may represent atelectasis or pneumonia. Overall the appearance of the lungs is similar to prior radiograph. There is no pneumothorax. Atherosclerotic calcification of the aorta. No acute osseous pathology. Left axillary vascular stent. IMPRESSION: 1. Cardiomegaly with  vascular congestion and edema 2. Bilateral pleural effusions, left greater right with bilateral airspace opacities concerning for developing pneumonia. Clinical correlation and follow-up recommended. Electronically Signed   By: Anner Crete M.D.   On: 09/17/2019 16:27     CODE STATUS:     Code Status Orders  (From admission, onward)         Start     Ordered   09/17/19 1810  Full code  Continuous     09/17/19 1810        Code Status History    Date Active Date Inactive Code Status Order ID Comments User Context   04/09/2019 0353 04/10/2019 2224 Full Code [id number]  Mansy, Arvella Merles, MD ED   Advance Care Planning Activity    Advance Directive Documentation     Most Recent Value  Type of Advance Directive  Living will, Healthcare Power of Attorney  Pre-existing out of facility DNR order (yellow form or pink MOST form)  --  "MOST" Form in Place?  --       TOTAL TIME TAKING CARE OF THIS PATIENT: 40 minutes.    Fritzi Mandes M.D  [facility]     CC: Primary care physician; Franciso Bend, MD

## 2019-09-19 NOTE — Progress Notes (Signed)
Weaned oxygen down to RA.  SpO2 at rest on RA was 95%.  While ambulating, SpO2 was 92% on RA.  Pt does not qualify for home oxygen.   Ambulated with steady gait and no assistive device.

## 2019-09-19 NOTE — Discharge Instructions (Addendum)
Pulmonary Edema Pulmonary edema is a condition in which fluid collects in the air sacs of the lung. This makes it hard for the lungs to fill with air. It also prevents the lungs from moving oxygen into the bloodstream, which can affect other organs, such as the brain and kidneys. Pulmonary edema is an emergency and should be treated immediately. There are two main types of pulmonary edema:  Cardiogenic. This means the pulmonary edema was caused by a problem with the heart.  Noncardiogenic. This means the pulmonary edema was caused by something other than the heart, such as an injury to the lung. What are the causes? This condition is commonly caused by heart failure. When this happens, the heart is not able to properly pump blood through the body. This can lead to increased pressure in the heart and blood building up in the veins around the lungs. When blood builds up in these veins, fluid gets pushed into the air sacs of the lung. Heart failure may be caused by:  Coronary artery disease.  High blood pressure.  Viral infection of the heart (myocarditis).  Leaky or stiff heart valves.  Irregular heartbeat (arrhythmia).  Fluid buildup caused by kidney problems. Other causes include:  Infection in the lungs (pneumonia), blood (sepsis), or other part of the body.  Severe injury to the chest.  Lung injury from heat or toxins, such as breathing in smoke or poisonous gas.  Inhaling vomit or water (pulmonaryaspiration).  Certain medicines.  High altitude. What are the signs or symptoms? Symptoms of this condition include:  Shortness of breath.  Coughing with frothy or bloody mucus.  Wheezing.  Feeling like you cannot get enough air.  Shallow and fast breathing.  Skin that is cool and damp, and has a pale or bluish color. How is this diagnosed? This condition is diagnosed based on:  Your medical history.  A physical exam.  Your symptoms. You may also have other  tests, including:  Chest X-ray.  Chest CT scan.  Blood tests, including checking the amount of oxygen in the blood.  Sputum culture. This test checks for infection in the mucus that you cough up from your lungs.  Electrocardiogram. This measures the electrical signals of the heart.  Echocardiogram. This uses an ultrasound to evaluate the health of the heart. How is this treated? Initial treatment for this condition focuses on relieving your symptoms. Treatment depends on the underlying cause of the condition. This may include:  Oxygen therapy. The oxygen may be given through tubes in your nose or through a face mask. In severe cases, a breathing tube is inserted into the windpipe and hooked up to a breathing machine (ventilator).  Medicines. These may include medicines to: ? Help the body get rid of extra water (diuretics). ? Help the heart pump blood properly. ? Prevent or destroy blood clots. If poor heart function is the cause, treatment may also include:  Procedures to open blocked arteries, repair damaged heart valves, or remove some of the damaged heart muscle.  A pacemaker to help with heart function.  A procedure that uses electric shocks to regulate heart rate (cardioversion). If an infection is the cause, treatment may include antibiotic medicines. Follow these instructions at home: Medicines  Take over-the-counter and prescription medicines only as told by your health care provider.  If you were prescribed an antibiotic, take it as told by your health care provider. Do not stop taking the antibiotic even if you start to feel better.  Have a plan with information about each medicine you take. This should include: ? Why you take the medicine. ? Possible side effects. ? Best time of day to take it. ? Foods to take with it, or foods to avoid when taking it. ? When to call your health care provider.  Make a list of each medicine, vitamin, or herbal supplement you  take. Keep the list with you at all times. Show it to your health care provider at each visit and before starting a new medicine. Update the list as you add or stop medicines. Lifestyle   Exercise regularly as told by your health care provider. It is important to do it safely. You can do this by: ? Pacing your activities to avoid shortness of breath or chest pain. ? Resting for at least 1 hour before and after meals. ? Asking about cardiac rehabilitation programs. These may include education, exercise plans, and counseling.  Eat a heart-healthy diet that is low in salt (sodium), saturated fat, and cholesterol. Your health care provider may recommend foods that are high in fiber, such as fresh fruits and vegetables, whole grains, and beans.  Do not use any products that contain nicotine or tobacco, such as cigarettes and e-cigarettes. If you need help quitting, ask your health care provider. General instructions  Maintain a healthy weight.  Keep a record of your weight: ? Record your hospital or clinic weight. When you get home, compare it to your scale and record your weight. ? Weigh yourself first thing each morning after you urinate and before you eat breakfast. Wear the same amount of clothing each time. Record the weights. ? Share your weight record with your health care provider. Daily weights are important in detecting the body's retention of excess fluid. ? Tell your health care provider right away if you gain weight quickly. Your medicines may need to be adjusted.  Check and record your blood pressure as often as told by your health care provider. Bring the records with you to clinic visits.  Consider therapy or joining a support group. This may help with any stress, fear, or anxiety.  Keep all follow-up visits as told by your health care provider. This is important. Get help right away if:  You gain weight quickly.  You have severe chest pain, especially if the pain is  crushing or pressure-like and spreads to the arms, back, neck, or jaw.  You have more swelling in your hands, feet, ankles, or abdomen.  You have nausea.  You have unusual sweating or your skin turns blue or pale.  Your shortness of breath gets worse.  You have dizziness, blurred vision, a headache, or unsteadiness.  Your blood pressure is higher than 180/120.  You cough up bloody mucus (sputum).  You cannot sleep because it is hard to breathe.  You feel a racing heart beat (palpitations).  You have anxiety or a feeling that you cannot get enough air. These symptoms may represent a serious problem that is an emergency. Do not wait to see if the symptoms will go away. Get medical help right away. Call your local emergency services (911 in the [place] Do not drive yourself to the hospital. Summary  Pulmonary edema is a condition in which fluid collects in the air sacs of your lungs. If left untreated, it can lead to a medical emergency.  This condition is most commonly caused by heart failure. Other causes can include infections or injury to the lungs.  Take over-the-counter  and prescription medicines only as told by your health care provider. This information is not intended to replace advice given to you by your health care provider. Make sure you discuss any questions you have with your health care provider. Document Revised: 07/08/2017 Document Reviewed: 10/06/2016 Elsevier Patient Education  [place]. TTS dialysis

## 2020-12-04 DIAGNOSIS — M7520 Bicipital tendinitis, unspecified shoulder: Secondary | ICD-10-CM | POA: Insufficient documentation

## 2020-12-04 DIAGNOSIS — M19049 Primary osteoarthritis, unspecified hand: Secondary | ICD-10-CM | POA: Insufficient documentation

## 2020-12-05 ENCOUNTER — Encounter: Payer: Self-pay | Admitting: Podiatry

## 2020-12-05 ENCOUNTER — Ambulatory Visit (INDEPENDENT_AMBULATORY_CARE_PROVIDER_SITE_OTHER): Payer: Medicare Other | Admitting: Podiatry

## 2020-12-05 ENCOUNTER — Ambulatory Visit (INDEPENDENT_AMBULATORY_CARE_PROVIDER_SITE_OTHER): Payer: Medicare Other

## 2020-12-05 ENCOUNTER — Other Ambulatory Visit: Payer: Self-pay

## 2020-12-05 DIAGNOSIS — S92354A Nondisplaced fracture of fifth metatarsal bone, right foot, initial encounter for closed fracture: Secondary | ICD-10-CM

## 2020-12-05 DIAGNOSIS — S99921A Unspecified injury of right foot, initial encounter: Secondary | ICD-10-CM

## 2020-12-05 MED ORDER — MELOXICAM 15 MG PO TABS: 15.0000 mg | ORAL_TABLET | Freq: Every day | ORAL | 1 refills | Status: DC

## 2020-12-05 MED ORDER — HYDROCODONE-ACETAMINOPHEN 5-325 MG PO TABS: 1.0000 | ORAL_TABLET | Freq: Four times a day (QID) | ORAL | 0 refills | Status: DC | PRN

## 2020-12-12 ENCOUNTER — Emergency Department: Payer: Medicare Other

## 2020-12-12 ENCOUNTER — Inpatient Hospital Stay
Admission: EM | Admit: 2020-12-12 | Discharge: 2020-12-14 | DRG: 871 | Disposition: A | Discharge status: Home / Self Care | Payer: Medicare Other | Attending: Hospitalist | Admitting: Hospitalist

## 2020-12-12 ENCOUNTER — Other Ambulatory Visit: Payer: Self-pay

## 2020-12-12 DIAGNOSIS — Z791 Long term (current) use of non-steroidal anti-inflammatories (NSAID): Secondary | ICD-10-CM

## 2020-12-12 DIAGNOSIS — R64 Cachexia: Secondary | ICD-10-CM | POA: Diagnosis present

## 2020-12-12 DIAGNOSIS — Z7722 Contact with and (suspected) exposure to environmental tobacco smoke (acute) (chronic): Secondary | ICD-10-CM | POA: Diagnosis present

## 2020-12-12 DIAGNOSIS — Z79899 Other long term (current) drug therapy: Secondary | ICD-10-CM

## 2020-12-12 DIAGNOSIS — J440 Chronic obstructive pulmonary disease with acute lower respiratory infection: Secondary | ICD-10-CM | POA: Diagnosis present

## 2020-12-12 DIAGNOSIS — D631 Anemia in chronic kidney disease: Secondary | ICD-10-CM | POA: Diagnosis present

## 2020-12-12 DIAGNOSIS — Z940 Kidney transplant status: Secondary | ICD-10-CM

## 2020-12-12 DIAGNOSIS — I770 Arteriovenous fistula, acquired: Secondary | ICD-10-CM | POA: Diagnosis not present

## 2020-12-12 DIAGNOSIS — Z20822 Contact with and (suspected) exposure to covid-19: Secondary | ICD-10-CM | POA: Diagnosis present

## 2020-12-12 DIAGNOSIS — N051 Unspecified nephritic syndrome with focal and segmental glomerular lesions: Secondary | ICD-10-CM

## 2020-12-12 DIAGNOSIS — J449 Chronic obstructive pulmonary disease, unspecified: Secondary | ICD-10-CM | POA: Diagnosis present

## 2020-12-12 DIAGNOSIS — J411 Mucopurulent chronic bronchitis: Secondary | ICD-10-CM | POA: Diagnosis not present

## 2020-12-12 DIAGNOSIS — E875 Hyperkalemia: Secondary | ICD-10-CM | POA: Diagnosis present

## 2020-12-12 DIAGNOSIS — Z992 Dependence on renal dialysis: Secondary | ICD-10-CM

## 2020-12-12 DIAGNOSIS — N2581 Secondary hyperparathyroidism of renal origin: Secondary | ICD-10-CM | POA: Diagnosis present

## 2020-12-12 DIAGNOSIS — N186 End stage renal disease: Secondary | ICD-10-CM | POA: Diagnosis not present

## 2020-12-12 DIAGNOSIS — A419 Sepsis, unspecified organism: Principal | ICD-10-CM | POA: Diagnosis present

## 2020-12-12 DIAGNOSIS — J189 Pneumonia, unspecified organism: Secondary | ICD-10-CM | POA: Diagnosis present

## 2020-12-12 DIAGNOSIS — I10 Essential (primary) hypertension: Secondary | ICD-10-CM | POA: Diagnosis present

## 2020-12-12 DIAGNOSIS — Z6823 Body mass index (BMI) 23.0-23.9, adult: Secondary | ICD-10-CM

## 2020-12-12 DIAGNOSIS — E785 Hyperlipidemia, unspecified: Secondary | ICD-10-CM | POA: Diagnosis present

## 2020-12-12 DIAGNOSIS — R54 Age-related physical debility: Secondary | ICD-10-CM | POA: Diagnosis present

## 2020-12-12 DIAGNOSIS — R778 Other specified abnormalities of plasma proteins: Secondary | ICD-10-CM | POA: Diagnosis present

## 2020-12-12 DIAGNOSIS — N269 Renal sclerosis, unspecified: Secondary | ICD-10-CM | POA: Diagnosis present

## 2020-12-12 DIAGNOSIS — I120 Hypertensive chronic kidney disease with stage 5 chronic kidney disease or end stage renal disease: Secondary | ICD-10-CM | POA: Diagnosis present

## 2020-12-12 LAB — COMPREHENSIVE METABOLIC PANEL
ALT: 8 U/L (ref 0–44)
AST: 11 U/L — ABNORMAL LOW (ref 15–41)
Albumin: 3.8 g/dL (ref 3.5–5.0)
Alkaline Phosphatase: 97 U/L (ref 38–126)
Anion gap: 14 (ref 5–15)
BUN: 29 mg/dL — ABNORMAL HIGH (ref 8–23)
CO2: 27 mmol/L (ref 22–32)
Calcium: 9.5 mg/dL (ref 8.9–10.3)
Chloride: 99 mmol/L (ref 98–111)
Creatinine, Ser: 6.97 mg/dL — ABNORMAL HIGH (ref 0.61–1.24)
GFR, Estimated: 8 mL/min — ABNORMAL LOW (ref >60)
Glucose, Bld: 83 mg/dL (ref 70–99)
Potassium: 4.3 mmol/L (ref 3.5–5.1)
Sodium: 140 mmol/L (ref 135–145)
Total Bilirubin: 0.7 mg/dL (ref 0.3–1.2)
Total Protein: 7.7 g/dL (ref 6.5–8.1)

## 2020-12-12 LAB — CBC WITH DIFFERENTIAL/PLATELET
Abs Immature Granulocytes: 0.08 10*3/uL — ABNORMAL HIGH (ref 0.00–0.07)
Basophils Absolute: 0 10*3/uL (ref 0.0–0.1)
Basophils Relative: 0 %
Eosinophils Absolute: 0 10*3/uL (ref 0.0–0.5)
Eosinophils Relative: 0 %
HCT: 34.5 % — ABNORMAL LOW (ref 39.0–52.0)
Hemoglobin: 11.1 g/dL — ABNORMAL LOW (ref 13.0–17.0)
Immature Granulocytes: 1 %
Lymphocytes Relative: 4 %
Lymphs Abs: 0.4 10*3/uL — ABNORMAL LOW (ref 0.7–4.0)
MCH: 32.9 pg (ref 26.0–34.0)
MCHC: 32.2 g/dL (ref 30.0–36.0)
MCV: 102.4 fL — ABNORMAL HIGH (ref 80.0–100.0)
Monocytes Absolute: 0.9 10*3/uL (ref 0.1–1.0)
Monocytes Relative: 9 %
Neutro Abs: 8.2 10*3/uL — ABNORMAL HIGH (ref 1.7–7.7)
Neutrophils Relative %: 86 %
Platelets: 192 10*3/uL (ref 150–400)
RBC: 3.37 MIL/uL — ABNORMAL LOW (ref 4.22–5.81)
RDW: 12.1 % (ref 11.5–15.5)
WBC: 9.5 10*3/uL (ref 4.0–10.5)
nRBC: 0 % (ref 0.0–0.2)

## 2020-12-12 LAB — PROCALCITONIN: Procalcitonin: 2.21 ng/mL

## 2020-12-12 LAB — TROPONIN I (HIGH SENSITIVITY)
Troponin I (High Sensitivity): 48 ng/L — ABNORMAL HIGH (ref <18)
Troponin I (High Sensitivity): 69 ng/L — ABNORMAL HIGH (ref <18)
Troponin I (High Sensitivity): 75 ng/L — ABNORMAL HIGH (ref <18)

## 2020-12-12 LAB — RESP PANEL BY RT-PCR (FLU A&B, COVID) ARPGX2
Influenza A by PCR: NEGATIVE
Influenza B by PCR: NEGATIVE
SARS Coronavirus 2 by RT PCR: NEGATIVE

## 2020-12-12 LAB — BLOOD GAS, VENOUS
Acid-Base Excess: 4.9 mmol/L — ABNORMAL HIGH (ref 0.0–2.0)
Bicarbonate: 29.2 mmol/L — ABNORMAL HIGH (ref 20.0–28.0)
O2 Saturation: 22.2 %
Patient temperature: 37
pCO2, Ven: 41 mmHg — ABNORMAL LOW (ref 44.0–60.0)
pH, Ven: 7.46 — ABNORMAL HIGH (ref 7.250–7.430)
pO2, Ven: <31 mmHg — CL (ref 32.0–45.0)

## 2020-12-12 LAB — HIV ANTIBODY (ROUTINE TESTING W REFLEX): HIV Screen 4th Generation wRfx: NONREACTIVE

## 2020-12-12 LAB — LACTIC ACID, PLASMA
Lactic Acid, Venous: 2.2 mmol/L (ref 0.5–1.9)
Lactic Acid, Venous: 1.8 mmol/L (ref 0.5–1.9)

## 2020-12-12 IMAGING — DX DG CHEST 1V
1 series · 1 of 1 positions shown · non-contrast
Comparison: [DATE] and older exams.

CLINICAL DATA: Short of breath.

EXAM:
CHEST  1 VIEW

[chest ap]
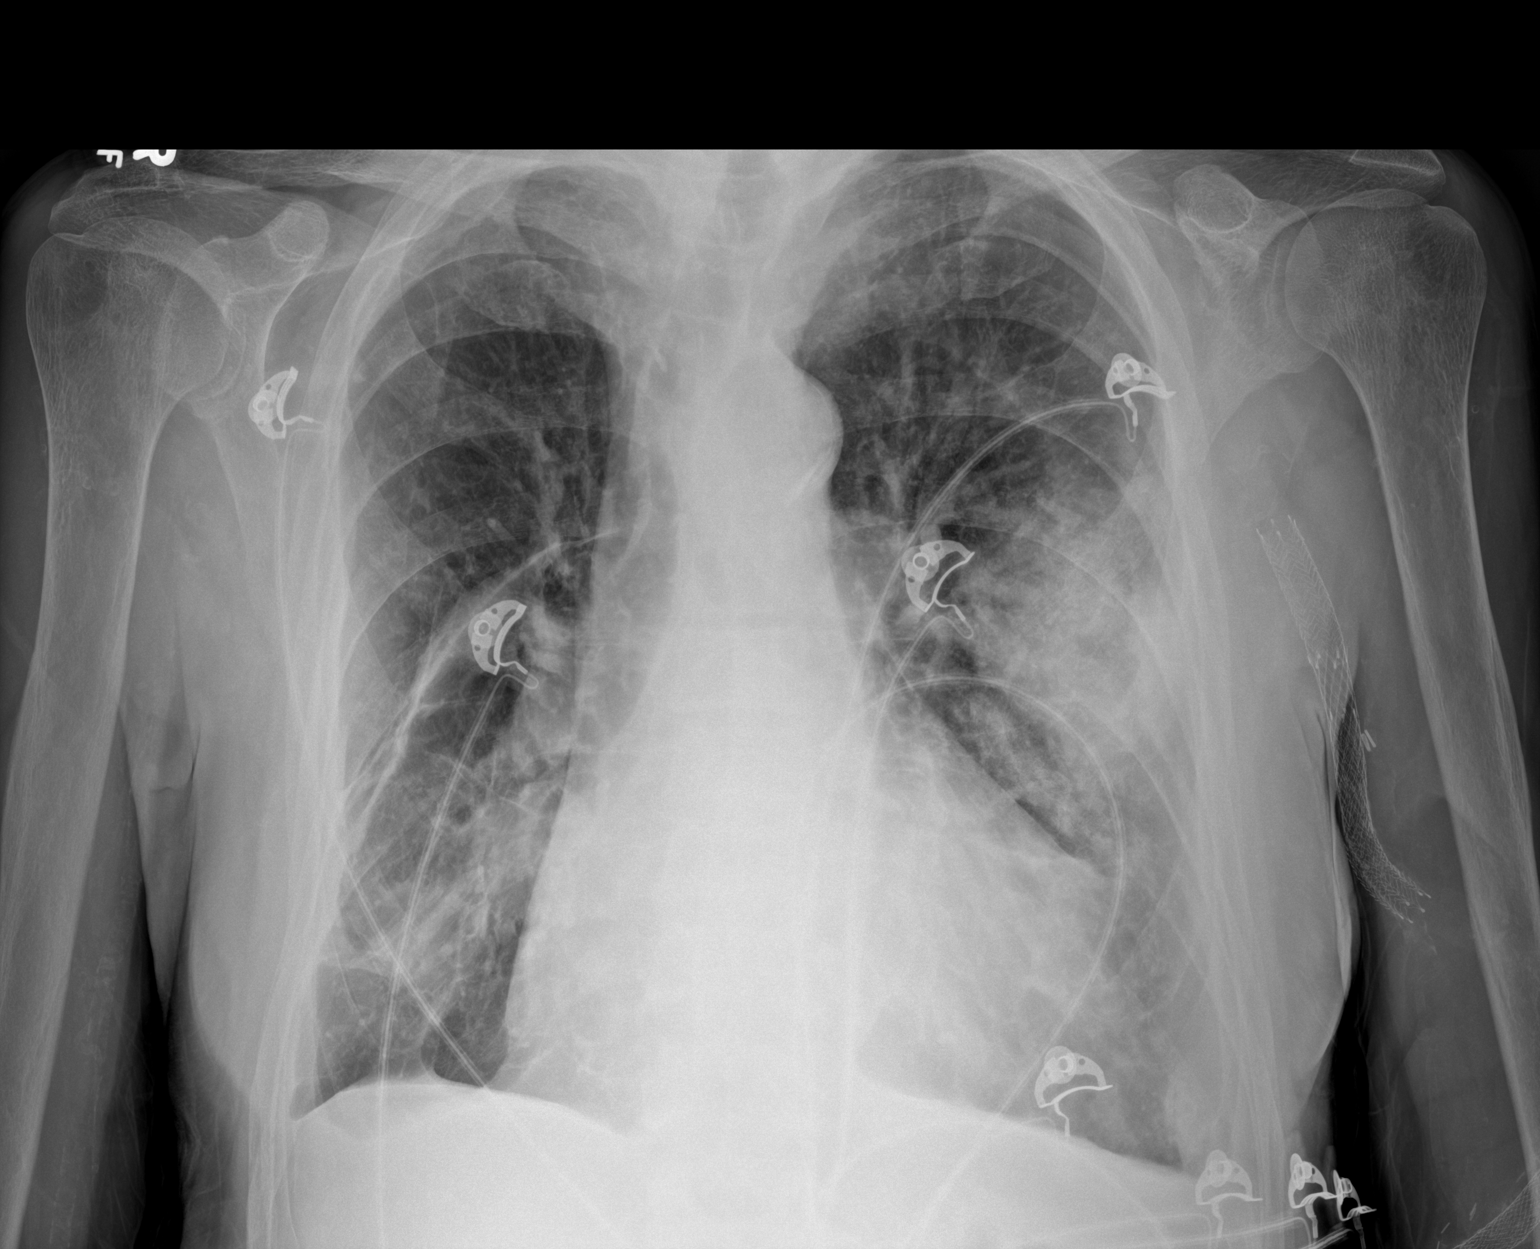

[1 of 1 positions shown; findings below may reference images not displayed]

FINDINGS: Cardiac silhouette is mildly enlarged. No mediastinal or hilar
masses.

Airspace consolidation in the left mid lung. Additional hazy
airspace opacity is noted in both lung bases. The remainder of the
lungs is clear. Lungs are hyperexpanded.

Blunted left hemidiaphragm consistent with a small effusion.

No pneumothorax.

Left arm vascular stent, stable. Skeletal structures are grossly
intact.
IMPRESSION: 1. Airspace consolidation in the left mid lung with additional
smaller areas of hazy opacity in both lung bases. Suspect pneumonia
in the left mid lung. Lung base opacities may reflect additional
infiltrate or be due to atelectasis. Probable small left effusion.
No convincing pulmonary edema.

## 2020-12-12 MED ORDER — HEPARIN SODIUM (PORCINE) 5000 UNIT/ML IJ SOLN
5000.0000 [IU] | Freq: Three times a day (TID) | INTRAMUSCULAR | Status: DC
  Administered 2020-12-12 – 2020-12-14 (×5): 5000 [IU] via SUBCUTANEOUS
  Filled 2020-12-12 (×5): qty 1

## 2020-12-12 MED ORDER — METOPROLOL TARTRATE 50 MG PO TABS
100.0000 mg | ORAL_TABLET | Freq: Two times a day (BID) | ORAL | Status: DC
  Administered 2020-12-12 – 2020-12-14 (×4): 100 mg via ORAL
  Filled 2020-12-12 (×4): qty 2

## 2020-12-12 MED ORDER — SODIUM CHLORIDE 0.9 % IV SOLN
2.0000 g | INTRAVENOUS | Status: DC
  Administered 2020-12-13 – 2020-12-14 (×2): 2 g via INTRAVENOUS
  Filled 2020-12-12 (×2): qty 20
  Filled 2020-12-12: qty 2

## 2020-12-12 MED ORDER — PANTOPRAZOLE SODIUM 40 MG PO TBEC
40.0000 mg | DELAYED_RELEASE_TABLET | Freq: Every day | ORAL | Status: DC
  Administered 2020-12-12 – 2020-12-14 (×2): 40 mg via ORAL
  Filled 2020-12-12 (×2): qty 1

## 2020-12-12 MED ORDER — SODIUM CHLORIDE 0.9 % IV SOLN
500.0000 mg | INTRAVENOUS | Status: DC
  Filled 2020-12-12: qty 500

## 2020-12-12 MED ORDER — ACETAMINOPHEN 325 MG PO TABS
650.0000 mg | ORAL_TABLET | Freq: Once | ORAL | Status: AC
  Administered 2020-12-12: 650 mg via ORAL
  Filled 2020-12-12: qty 2

## 2020-12-12 MED ORDER — CHLORHEXIDINE GLUCONATE CLOTH 2 % EX PADS
6.0000 | MEDICATED_PAD | Freq: Every day | CUTANEOUS | Status: DC
  Administered 2020-12-13 – 2020-12-14 (×2): 6 via TOPICAL

## 2020-12-12 MED ORDER — SODIUM CHLORIDE 0.9 % IV SOLN
500.0000 mg | Freq: Once | INTRAVENOUS | Status: AC
  Administered 2020-12-12: 500 mg via INTRAVENOUS
  Filled 2020-12-12: qty 500

## 2020-12-12 MED ORDER — PRAVASTATIN SODIUM 20 MG PO TABS: 20.0000 mg | ORAL_TABLET | Freq: Every day | ORAL | Status: DC

## 2020-12-12 MED ORDER — MINOXIDIL 2.5 MG PO TABS
2.5000 mg | ORAL_TABLET | Freq: Two times a day (BID) | ORAL | Status: DC
  Administered 2020-12-12 – 2020-12-14 (×3): 2.5 mg via ORAL
  Filled 2020-12-12 (×6): qty 1

## 2020-12-12 MED ORDER — SODIUM CHLORIDE 0.9 % IV SOLN
1.0000 g | Freq: Once | INTRAVENOUS | Status: AC
  Administered 2020-12-12: 1 g via INTRAVENOUS
  Filled 2020-12-12: qty 10

## 2020-12-12 MED ORDER — AMLODIPINE BESYLATE 10 MG PO TABS
10.0000 mg | ORAL_TABLET | Freq: Every day | ORAL | Status: DC
  Administered 2020-12-12 – 2020-12-14 (×3): 10 mg via ORAL
  Filled 2020-12-12 (×3): qty 1

## 2020-12-12 MED ORDER — SEVELAMER CARBONATE 800 MG PO TABS
2400.0000 mg | ORAL_TABLET | Freq: Three times a day (TID) | ORAL | Status: DC
  Administered 2020-12-12 – 2020-12-14 (×3): 2400 mg via ORAL
  Filled 2020-12-12 (×4): qty 3

## 2020-12-12 MED ORDER — SODIUM CHLORIDE 0.9 % IV BOLUS
1000.0000 mL | Freq: Once | INTRAVENOUS | Status: AC
  Administered 2020-12-12: 1000 mL via INTRAVENOUS

## 2020-12-12 MED ORDER — SEVELAMER CARBONATE 800 MG PO TABS
1600.0000 mg | ORAL_TABLET | Freq: Two times a day (BID) | ORAL | Status: DC | PRN
  Filled 2020-12-12: qty 2

## 2020-12-12 NOTE — ED Notes (Signed)
Informed RN bed assigned 

## 2020-12-12 NOTE — ED Provider Notes (Addendum)
[facility] Emergency Department Provider Note  ____________________________________________   Event Date/Time   First MD Initiated Contact with Patient 12/12/20 1108     (approximate)  I have reviewed the triage vital signs and the nursing notes.   HISTORY  Chief Complaint Shortness of Breath and Cough   HPI Scott Castro is a 65 y.o. male with a significant past medical history of COPD, respiratory failure, ESRD on dialysis presents to the emergency department for treatment and evaluation of shortness of breath that started 3 days ago. Last dialysis was yesterday. Denies pain including chest or abdomen. No known fever. Occasional productive cough.   Past Medical History:  Diagnosis Date  . Barrett's esophagus   . Chronic kidney disease    CHRONIC\  . Glomerulosclerosis, focal   . Hyperparathyroidism due to renal insufficiency [facility])     Patient Active Problem List   Diagnosis Date Noted  . Sepsis ([place]) 12/12/2020  . Bicipital tenosynovitis 12/04/2020  . Localized, primary osteoarthritis of hand 12/04/2020  . Hyperkalemia   . Acute pulmonary edema ([place]) 09/18/2019  . End stage renal disease on dialysis ([place]) 09/18/2019  . Hypertensive emergency 09/18/2019  . Acute hypoxemic respiratory failure ([place]) 09/17/2019  . COPD (chronic obstructive pulmonary disease) ([place]) 04/10/2019  . COPD exacerbation ([place]) 04/09/2019  . Nonintractable epilepsy with complex partial seizures ([place]) 08/20/2014  . Hypertension 01/09/2013  . Lung nodule, multiple 07/28/2012  . Organ transplant candidate 02/04/2012  . Arteriovenous fistula ([place]) 01/21/2012  . At risk for dental problems 01/21/2012  . Barrett esophagus 01/21/2012  . FSGS (focal segmental glomerulosclerosis) 01/21/2012  . Kidney transplanted 01/21/2012    Past Surgical History:  Procedure Laterality Date  . AV FISTULA PLACEMENT    . COLONOSCOPY N/A 03/22/2016   Procedure: COLONOSCOPY;  Surgeon: Manya Silvas, MD;  Location: [facility] ENDOSCOPY;  Service: Endoscopy;  Laterality: N/A;  . DG AV DIALYSIS GRAFT DECLOT OR    . ESOPHAGOGASTRODUODENOSCOPY (EGD) WITH PROPOFOL  03/22/2016   Procedure: ESOPHAGOGASTRODUODENOSCOPY (EGD) WITH PROPOFOL;  Surgeon: Manya Silvas, MD;  Location: [facility] ENDOSCOPY;  Service: Endoscopy;;  . FLEXIBLE BRONCHOSCOPY    . KIDNEY TRANSPLANT Right 1985  . REMOVAL TENCKHOFF CATH      Prior to Admission medications   Medication Sig Start Date End Date Taking? Authorizing Provider  amLODipine (NORVASC) 10 MG tablet Take 10 mg by mouth daily at 6 (six) AM.   Yes [provider]  cinacalcet (SENSIPAR) 60 MG tablet Take 60 mg by mouth daily.   Yes [provider]  cloNIDine (CATAPRES) 0.3 MG tablet Take 0.3 mg by mouth 3 (three) times daily.   Yes [provider]  enalapril (VASOTEC) 20 MG tablet Take 20 mg by mouth 2 (two) times daily.   Yes [provider]  meloxicam (MOBIC) 15 MG tablet Take 1 tablet (15 mg total) by mouth daily. 12/05/20  Yes Edrick Kins, DPM  metoprolol (LOPRESSOR) 100 MG tablet Take 100 mg by mouth 2 (two) times daily.   Yes [provider]  minoxidil (LONITEN) 2.5 MG tablet Take by mouth 2 (two) times daily.   Yes [provider]  pantoprazole (PROTONIX) 40 MG tablet Take 40 mg by mouth daily. 03/27/19  Yes [provider]  sevelamer carbonate (RENVELA) 800 MG tablet Take by mouth See admin instructions. Take 2400 mg by mouth three times daily, 1600 mg with snacks   Yes [provider]  HYDROcodone-acetaminophen (NORCO/VICODIN) 5-325 MG tablet  Take 1 tablet by mouth every 6 (six) hours as needed for moderate pain. Patient not taking: Reported on 12/12/2020 12/05/20   Edrick Kins, DPM  pravastatin (PRAVACHOL) 20 MG tablet Take 20 mg by mouth daily. Patient not taking: Reported on 12/12/2020    [provider]    Allergies Patient has no known allergies.  History  reviewed. No pertinent family history.  Social History Social History   Tobacco Use  . Smoking status: Never Smoker  . Smokeless tobacco: Never Used  Substance Use Topics  . Alcohol use: No    Comment: "once every couple months"  . Drug use: No    Review of Systems  Constitutional: No fever/ positive for chills. Eyes: No visual changes. ENT: No sore throat. Cardiovascular: Negative for chest pain/pressure. Negative for pleuritic pain. Negative for palpitations. Negative for leg pain. Respiratory: Positive for shortness of breath. Gastrointestinal: Negative for abdominal pain. No nausea, no vomiting.  No diarrhea.  No constipation. Genitourinary: Negative for dysuria. Musculoskeletal: Negative for back pain.  Skin: Negative for rash, lesion, wound. Neurological: Negative for headaches, focal weakness or numbness  ____________________________________________   PHYSICAL EXAM:  VITAL SIGNS: ED Triage Vitals [12/12/20 1108]  Enc Vitals Group     BP (!) 194/77     Pulse Rate (!) 110     Resp 20     Temp (!) 100.5 F (38.1 C)     Temp Source Oral     SpO2 97 %     Weight 155 lb 13.8 oz (70.7 kg)     Height 5\' 9"  (1.753 m)     Head Circumference      Peak Flow      Pain Score 0     Pain Loc      Pain Edu?      Excl. in [place]?     Constitutional: Alert and oriented. Chronically ill appearing and in no acute distress. Normal mental status. Eyes: Conjunctivae are normal. PERRL. Head: Atraumatic. Nose: No congestion/rhinnorhea. Mouth/Throat: Mucous membranes are moist.  Oropharynx non-erythematous. Tongue normal in size and color. Neck: No stridor. No carotid bruit appreciated on exam. No JVD. Hematological/Lymphatic/Immunilogical: No cervical lymphadenopathy. Cardiovascular: Tachycardic rate, regular rhythm. Grossly normal heart sounds.  Good peripheral circulation. Respiratory: Normal respiratory effort.  No retractions. Lungs diminished. Gastrointestinal: Soft and  nontender. No distention. No abdominal bruits. No CVA tenderness. Genitourinary: Exam deferred. Musculoskeletal: Tenderness in the right great toe. No edema of extremities. Neurologic:  Normal speech and language. No gross focal neurologic deficits are appreciated. Skin:  Skin is warm, dry and intact. No rash noted. Psychiatric: Mood and affect are normal. Speech and behavior are normal.  ____________________________________________   LABS (all labs ordered are listed, but only abnormal results are displayed)  Labs Reviewed  COMPREHENSIVE METABOLIC PANEL - Abnormal; Notable for the following components:      Result Value   BUN 29 (*)    Creatinine, Ser 6.97 (*)    AST 11 (*)    GFR, Estimated 8 (*)    All other components within normal limits  LACTIC ACID, PLASMA - Abnormal; Notable for the following components:   Lactic Acid, Venous 2.2 (*)    All other components within normal limits  CBC WITH DIFFERENTIAL/PLATELET - Abnormal; Notable for the following components:   RBC 3.37 (*)    Hemoglobin 11.1 (*)    HCT 34.5 (*)    MCV 102.4 (*)    Neutro Abs 8.2 (*)  Lymphs Abs 0.4 (*)    Abs Immature Granulocytes 0.08 (*)    All other components within normal limits  BLOOD GAS, VENOUS - Abnormal; Notable for the following components:   pH, Ven 7.46 (*)    pCO2, Ven 41 (*)    pO2, Ven <31.0 (*)    Bicarbonate 29.2 (*)    Acid-Base Excess 4.9 (*)    All other components within normal limits  TROPONIN I (HIGH SENSITIVITY) - Abnormal; Notable for the following components:   Troponin I (High Sensitivity) 48 (*)    All other components within normal limits  RESP PANEL BY RT-PCR (FLU A&B, COVID) ARPGX2  CULTURE, BLOOD (ROUTINE X 2)  CULTURE, BLOOD (ROUTINE X 2)  LACTIC ACID, PLASMA  PROCALCITONIN  TROPONIN I (HIGH SENSITIVITY)   ____________________________________________  EKG  ED ECG REPORT I, Carlinda Ohlson, FNP-BC personally viewed and interpreted this ECG.   Date:  12/12/2020  EKG Time: 1110  Rate: 110  Rhythm: sinus tachycardia  Axis: normal  Intervals:none  ST&T Change: ST depression lateral leads.  Similar to ECG 09/2019, ST depression slightly more pronounced today.  ____________________________________________  RADIOLOGY  ED MD interpretation:  Bilateral scattered opacities   I, Marsalis Beaulieu, personally viewed and evaluated these images (plain radiographs) as part of my medical decision making, as well as reviewing the written report by the radiologist.  Official radiology report(s): DG Chest 1 View  Result Date: 12/12/2020 CLINICAL DATA:  Short of breath. EXAM: CHEST  1 VIEW COMPARISON:  09/18/2019 and older exams. FINDINGS: Cardiac silhouette is mildly enlarged. No mediastinal or hilar masses. Airspace consolidation in the left mid lung. Additional hazy airspace opacity is noted in both lung bases. The remainder of the lungs is clear. Lungs are hyperexpanded. Blunted left hemidiaphragm consistent with a small effusion. No pneumothorax. Left arm vascular stent, stable. Skeletal structures are grossly intact. IMPRESSION: 1. Airspace consolidation in the left mid lung with additional smaller areas of hazy opacity in both lung bases. Suspect pneumonia in the left mid lung. Lung base opacities may reflect additional infiltrate or be due to atelectasis. Probable small left effusion. No convincing pulmonary edema. Electronically Signed   By: Lajean Manes M.D.   On: 12/12/2020 12:03    ____________________________________________   PROCEDURES  Procedure(s) performed: None  Procedures  Critical Care performed: Yes, see critical care note(s)  ____________________________________________   INITIAL IMPRESSION / ASSESSMENT AND PLAN   As part of my medical decision making, I reviewed the following data within the [facility] EKG reviewed, Evaluated by EM attending Dr. Corky Downs and Notes from prior ED visits  65 year old male  presenting with shortness of breath. See HPI. He is  Noted to be tachycardic and febrile. Plan will be to rule out conditions listed below. No current respiratory distress.  ____________________________________________  Differential diagnosis includes, but not limited to:  Sepsis, COVID, Pneumonia, PE  ED COURSE  ----------------------------------------- 11:50 AM on 12/12/2020 ----------------------------------------- Chest x-ray reviewed by me. Concerned for sepsis secondary to pneumonia. Code Sepsis initiated.    ----------------------------------------- 12:29 PM on 12/12/2020 ----------------------------------------- CBC is normal CMP is consistent with end-stage renal disease, troponin of 48 likely also related to end-stage renal disease, lactic acid is 2.2.  Plan at this point will be to admit for sepsis related to pneumonia.  Vital signs reviewed, pulse rate now down to 94 with a respiratory rate of 20, 96% SPO2.  Patient advised of diagnosis and agrees to admission.  CRITICAL  CARE Performed by: Sherrie George   Total critical care time: 30 minutes  Critical care time was exclusive of separately billable procedures and treating other patients.  Critical care was necessary to treat or prevent imminent or life-threatening deterioration.  Critical care was time spent personally by me on the following activities: development of treatment plan with patient and/or surrogate as well as nursing, discussions with consultants, evaluation of patient's response to treatment, examination of patient, obtaining history from patient or surrogate, ordering and performing treatments and interventions, ordering and review of laboratory studies, ordering and review of radiographic studies, pulse oximetry and re-evaluation of patient's condition.   FINAL CLINICAL IMPRESSION(S) / ED DIAGNOSES  Final diagnoses:  Sepsis due to pneumonia [facility])     ED Discharge Orders    None       Rowland A  Wery was evaluated in Emergency Department on 12/12/2020 for the symptoms described in the history of present illness. He was evaluated in the context of the global COVID-19 pandemic, which necessitated consideration that the patient might be at risk for infection with the SARS-CoV-2 virus that causes COVID-19. Institutional protocols and algorithms that pertain to the evaluation of patients at risk for COVID-19 are in a state of rapid change based on information released by regulatory bodies including the CDC and federal and state organizations. These policies and algorithms were followed during the patient's care in the ED.   Note:  This document was prepared using Dragon voice recognition software and may include unintentional dictation errors.   Victorino Dike, FNP 12/12/20 1322    Lavonia Drafts, MD 12/12/20 [place], [place], FNP 12/12/20 1339    Lavonia Drafts, MD 12/12/20 1341

## 2020-12-12 NOTE — Progress Notes (Signed)
Pt refused blood draws at this time. MD made aware

## 2020-12-12 NOTE — Progress Notes (Signed)
[facility]  ROUNDING NOTE   Subjective:   [name] was admitted to [facility] on 12/12/2020 for Sepsis [facility]) [A41.9]  Last hemodialysis treatment was yesterday, 5/5.   Patient has been having increasing shortness of breath, cough and subjective feeling bad. Went to Dialysis last two treatment despite feeling bad. Rapid COVID at the clinic was negative.     Objective:  Vital signs in last 24 hours:  Temp:  [100.5 F (38.1 C)] 100.5 F (38.1 C) (05/06 1108) Pulse Rate:  [94-110] 94 (05/06 1200) Resp:  [20-25] 20 (05/06 1200) BP: (184-194)/(66-77) 189/66 (05/06 1200) SpO2:  [96 %-97 %] 96 % (05/06 1200) Weight:  [70.7 kg] 70.7 kg (05/06 1108)  Weight change:  Filed Weights   12/12/20 1108  Weight: 70.7 kg    Intake/Output: No intake/output data recorded.   Intake/Output this shift:  No intake/output data recorded.  Physical Exam: General: NAD,   Head: Normocephalic, atraumatic. Moist oral mucosal membranes  Eyes: Anicteric, PERRL  Neck: Supple, trachea midline  Lungs:  Left sided crackles  Heart: Regular rate and rhythm  Abdomen:  Soft, nontender,   Extremities:  no peripheral edema.  Neurologic: Nonfocal, moving all four extremities  Skin: No lesions  Access: Left AVF    Basic Metabolic Panel: Recent Labs  Lab 12/12/20 1132  NA 140  K 4.3  CL 99  CO2 27  GLUCOSE 83  BUN 29*  CREATININE 6.97*  CALCIUM 9.5    Liver Function Tests: Recent Labs  Lab 12/12/20 1132  AST 11*  ALT 8  ALKPHOS 97  BILITOT 0.7  PROT 7.7  ALBUMIN 3.8   No results for input(s): LIPASE, AMYLASE in the last 168 hours. No results for input(s): AMMONIA in the last 168 hours.  CBC: Recent Labs  Lab 12/12/20 1132  WBC 9.5  NEUTROABS 8.2*  HGB 11.1*  HCT 34.5*  MCV 102.4*  PLT 192    Cardiac Enzymes: No results for input(s): CKTOTAL, CKMB, CKMBINDEX, TROPONINI in the last 168 hours.  BNP: Invalid input(s): POCBNP  CBG: No results for input(s):  GLUCAP in the last 168 hours.  Microbiology: Results for orders placed or performed during the hospital encounter of 12/12/20  Resp Panel by RT-PCR (Flu A&B, Covid) Nasopharyngeal Swab     Status: None   Collection Time: 12/12/20 11:32 AM   Specimen: Nasopharyngeal Swab; Nasopharyngeal(NP) swabs in vial transport medium  Result Value Ref Range Status   SARS Coronavirus 2 by RT PCR NEGATIVE NEGATIVE Final    Comment: (NOTE) SARS-CoV-2 target nucleic acids are NOT DETECTED.  The SARS-CoV-2 RNA is generally detectable in upper respiratory specimens during the acute phase of infection. The lowest concentration of SARS-CoV-2 viral copies this assay can detect is 138 copies/mL. A negative result does not preclude SARS-Cov-2 infection and should not be used as the sole basis for treatment or other patient management decisions. A negative result may occur with  improper specimen collection/handling, submission of specimen other than nasopharyngeal swab, presence of viral mutation(s) within the areas targeted by this assay, and inadequate number of viral copies(<138 copies/mL). A negative result must be combined with clinical observations, patient history, and epidemiological information. The expected result is Negative.  Fact Sheet for Patients:  EntrepreneurPulse.com.au  Fact Sheet for Healthcare Providers:  IncredibleEmployment.be  This test is no t yet approved or cleared by the [place] FDA and  has been authorized for detection and/or diagnosis of SARS-CoV-2 by FDA under an Emergency  Use Authorization (EUA). This EUA will remain  in effect (meaning this test can be used) for the duration of the COVID-19 declaration under Section 564(b)(1) of the Act, 21 U.S.C.section 360bbb-3(b)(1), unless the authorization is terminated  or revoked sooner.       Influenza A by PCR NEGATIVE NEGATIVE Final   Influenza B by PCR NEGATIVE NEGATIVE Final     Comment: (NOTE) The Xpert Xpress SARS-CoV-2/FLU/RSV plus assay is intended as an aid in the diagnosis of influenza from Nasopharyngeal swab specimens and should not be used as a sole basis for treatment. Nasal washings and aspirates are unacceptable for Xpert Xpress SARS-CoV-2/FLU/RSV testing.  Fact Sheet for Patients: EntrepreneurPulse.com.au  Fact Sheet for Healthcare Providers: IncredibleEmployment.be  This test is not yet approved or cleared by the [place] FDA and has been authorized for detection and/or diagnosis of SARS-CoV-2 by FDA under an Emergency Use Authorization (EUA). This EUA will remain in effect (meaning this test can be used) for the duration of the COVID-19 declaration under Section 564(b)(1) of the Act, 21 U.S.C. section 360bbb-3(b)(1), unless the authorization is terminated or revoked.  Performed at [facility], [address] [place], [place] [address]     Coagulation Studies: No results for input(s): LABPROT, INR in the last 72 hours.  Urinalysis: No results for input(s): COLORURINE, LABSPEC, PHURINE, GLUCOSEU, HGBUR, BILIRUBINUR, KETONESUR, PROTEINUR, UROBILINOGEN, NITRITE, LEUKOCYTESUR in the last 72 hours.  Invalid input(s): APPERANCEUR    Imaging: DG Chest 1 View  Result Date: 12/12/2020 CLINICAL DATA:  Short of breath. EXAM: CHEST  1 VIEW COMPARISON:  09/18/2019 and older exams. FINDINGS: Cardiac silhouette is mildly enlarged. No mediastinal or hilar masses. Airspace consolidation in the left mid lung. Additional hazy airspace opacity is noted in both lung bases. The remainder of the lungs is clear. Lungs are hyperexpanded. Blunted left hemidiaphragm consistent with a small effusion. No pneumothorax. Left arm vascular stent, stable. Skeletal structures are grossly intact. IMPRESSION: 1. Airspace consolidation in the left mid lung with additional smaller areas of hazy opacity in both lung bases.  Suspect pneumonia in the left mid lung. Lung base opacities may reflect additional infiltrate or be due to atelectasis. Probable small left effusion. No convincing pulmonary edema. Electronically Signed   By: Lajean Manes M.D.   On: 12/12/2020 12:03   DG Foot Complete Right  Result Date: 12/11/2020 Please see detailed radiograph report in office note.    Medications:   . azithromycin 500 mg (12/12/20 1313)  . [START ON 12/13/2020] azithromycin    . [START ON 12/13/2020] cefTRIAXone (ROCEPHIN)  IV     . amLODipine  10 mg Oral Q0600  . heparin  5,000 Units Subcutaneous Q8H  . metoprolol tartrate  100 mg Oral BID  . minoxidil  2.5 mg Oral BID  . pantoprazole  40 mg Oral Daily  . pravastatin  20 mg Oral Daily  . sevelamer carbonate  800 mg Oral TID WC     Assessment/ Plan:  [name] is a 65 y.o. white male with end stage renal disease on hemodialysis secondary to FSGS, history of kidney transplant, hypertension, Barrett's esophagus, hyperlipidemia who is admitted to [facility] on 12/12/2020 for Sepsis [facility]) [A41.9]  [facility] [place]. AVF  1. End Stage Renal Disease: on hemodialysis. Dialysis yesterday.  - Next treatment for tomorrow.  - Renal diet  2. Hypertension: patient seems to not be complaint with his medications. Home regimen of amlodipine, minoxidil, and metoprolol.  3. Anemia of chronic kidney disease: hemoglobin 11.1. Macrocytic.  - EPO as outpatient.   4. Secondary Hyperparathyroidism:  - continue sevelamer    LOS: 0 Tyreanna Bisesi 5/6/20221:16 PM

## 2020-12-12 NOTE — Progress Notes (Signed)
elink monitoring for sepsis protocol 

## 2020-12-12 NOTE — Progress Notes (Signed)
Notified MD troponin went up to 69 as reported to me by the lab

## 2020-12-12 NOTE — Progress Notes (Signed)
Called lab and asked them to please come draw the ordered lactic acid

## 2020-12-12 NOTE — Plan of Care (Signed)

## 2020-12-12 NOTE — ED Triage Notes (Signed)
Pt BIB [facility] from home, complaint of shortness of breath and cough since Tuesday, endorses weakness starting this morning. Hx of HTN and has not taken any prescribed meds last night or this morning. Pt is dialysis patient, denies missing any sessions. Negative covid test on Tuesday.

## 2020-12-12 NOTE — H&P (Signed)
History and Physical   Scott Castro:[id number] DOB: July 28, 1956 DOA: 12/12/2020  PCP: Franciso Bend, MD  Outpatient Specialists: Dr. Vella Kohler  Patient coming from: home   I have personally briefly reviewed patient's old medical records in [place].  Chief Concern: Shortness of breath  HPI: Scott Castro is a 65 y.o. male with medical history significant for hypertension, end-stage renal disease on hemodialysis, COPD/asthma secondary to secondhand smoke, presents to the emergency department for chief concerns of shortness of breath.  He reports that the shortness of breath started Tuesday AM on 12/09/2020.  He endorses associated fever. T max on Tuesday 101. He calmed himself down and felt normal again and therefore did not present to the emergency department earlier. He went to HD per usual and felt okay. He didn't take anything and the symptoms and fever improved. He reports that he has had a cough with yellow phlegm, however he chronically coughs up yellow phelgm and sees a pulmonologist.  Per patient his pulmonologist has not been able to give him an infectious diagnosis.  Social history: lives by himself. He currently works full time at [employer]. He denies never using tobacco products, recreational drugs. He infrequently drinks 1 margarita per year.  Vaccinations: he has had Moderna x 2 in March 2021. He has not had a booster.   ROS: Constitutional: no weight change, no fever ENT/Mouth: no sore throat, no rhinorrhea Eyes: no eye pain, no vision changes Cardiovascular: no chest pain, no dyspnea,  no edema, no palpitations Respiratory: no cough, no sputum, no wheezing Gastrointestinal: no nausea, no vomiting, no diarrhea, no constipation Genitourinary: no urinary incontinence, no dysuria, no hematuria Musculoskeletal: no arthralgias, no myalgias Skin: no skin lesions, no pruritus, Neuro: + weakness, no loss of consciousness, no syncope Psych: no anxiety, no depression, +  decrease appetite Heme/Lymph: no bruising, no bleeding  ED Course: Discussed with EDP, patient requiring hospitalization for chief concerns of sepsis.  Vitals in the emergency department was remarkable for T-max of 100.5, respiration rate of 21, heart rate of 100, blood pressure 168/64, SPO2 of 94% on room air.  VBG showed pH 7.4 6/41/31  Labs also remarkable for sodium 140, potassium 4.3, chloride 99, bicarb 27, BUN 29, serum creatinine 6.97, nonfasting blood glucose 83, troponin initially 48, lactic acid 2.2, WBC 9.5, hemoglobin 11.1, platelets 192.  Patient was given azithromycin 500 milligrams IV, ceftriaxone IV, 1 L normal saline bolus per EDP.  Assessment/Plan  Principal Problem:   Sepsis ([place]) Active Problems:   COPD (chronic obstructive pulmonary disease) (HCC)   End stage renal disease on dialysis (HCC)   Hyperkalemia   Arteriovenous fistula (HCC)   FSGS (focal segmental glomerulosclerosis)   Hypertension   Kidney transplanted   Shortness of breath Meets sepsis criteria- increased lactic acid 2.2, increased respiration rate, increased heart rate, with source of pneumonia - Pro-Cal was positive at 2.21 and chest x-ray - COVID was negative - Blood cultures x2 were ordered, patient declines - UA was not ordered as patient is an uric - Status post azithromycin and ceftriaxone IV per EDP, we will continue this  Elevated troponin-low clinical suspicion for ACS as patient denies chest pain - Suspect secondary to sepsis - High-sensitivity troponin x2 ordered -Treat as above  ESRD on HD - nephrology has been consulted for continuation of hemodialysis via left antecubital AV access - Patient has history of transplant, and the transplanted kidney developed FSGS - Tuesday, Thursday, Saturday, patient is compliant with dialysis  session and has not missed dialysis in the last week - Patient is anuric  Hypertension-elevated, patient did not take his antihypertensive prior to  presentation due to feeling poorly - Resumed home amlodipine 10 mg daily, metoprolol tartrate 100 mg twice daily, minoxidil 2.5 mg p.o. twice daily  Protein/calorie malnutrition- moderate to severe, dietitian has been consulted  COPD-appears to be compensated at this time it does not appear to be in exacerbation - Per pulmonology note, patient is not on any inhalers, and doing well, pulmonologist states to continue spirometry and elevate head of bed - Spirometry ordered, elevate head of bed ordered  COVID PCR/influenza A/influenza B PCR were negative  Chart reviewed.   DVT prophylaxis: Heparin 5000 units subcutaneous every 8 hours Code Status: Full code Diet: Renal Family Communication: No Disposition Plan: Pending clinical course Consults called: Nephrology Admission status: MedSurg, observation, with telemetry, will expire in 24 hours  Past Medical History:  Diagnosis Date  . Barrett's esophagus   . Chronic kidney disease    CHRONIC\  . Glomerulosclerosis, focal   . Hyperparathyroidism due to renal insufficiency [facility])    Past Surgical History:  Procedure Laterality Date  . AV FISTULA PLACEMENT    . COLONOSCOPY N/A 03/22/2016   Procedure: COLONOSCOPY;  Surgeon: Manya Silvas, MD;  Location: [facility] ENDOSCOPY;  Service: Endoscopy;  Laterality: N/A;  . DG AV DIALYSIS GRAFT DECLOT OR    . ESOPHAGOGASTRODUODENOSCOPY (EGD) WITH PROPOFOL  03/22/2016   Procedure: ESOPHAGOGASTRODUODENOSCOPY (EGD) WITH PROPOFOL;  Surgeon: Manya Silvas, MD;  Location: [facility] ENDOSCOPY;  Service: Endoscopy;;  . FLEXIBLE BRONCHOSCOPY    . KIDNEY TRANSPLANT Right 1985  . REMOVAL TENCKHOFF CATH     Social History:  reports that he has never smoked. He has never used smokeless tobacco. He reports that he does not drink alcohol and does not use drugs.  No Known Allergies History reviewed. No pertinent family history. Family history: Family history reviewed and not pertinent  Prior to Admission  medications   Medication Sig Start Date End Date Taking? Authorizing Provider  amLODipine (NORVASC) 10 MG tablet Take 10 mg by mouth daily at 6 (six) AM.    [provider]  cloNIDine (CATAPRES) 0.3 MG tablet Take 0.3 mg by mouth 3 (three) times daily.    [provider]  HYDROcodone-acetaminophen (NORCO/VICODIN) 5-325 MG tablet Take 1 tablet by mouth every 6 (six) hours as needed for moderate pain. 12/05/20   Edrick Kins, DPM  meloxicam (MOBIC) 15 MG tablet Take 1 tablet (15 mg total) by mouth daily. 12/05/20   Edrick Kins, DPM  metoprolol (LOPRESSOR) 100 MG tablet Take 100 mg by mouth 2 (two) times daily.    [provider]  minoxidil (LONITEN) 2.5 MG tablet Take by mouth 2 (two) times daily.    [provider]  pantoprazole (PROTONIX) 40 MG tablet Take 40 mg by mouth daily. 03/27/19   [provider]  pravastatin (PRAVACHOL) 20 MG tablet Take 20 mg by mouth daily.    [provider]  sevelamer carbonate (RENVELA) 800 MG tablet Take by mouth See admin instructions. Take 2400 mg by mouth three times daily, 1600 mg with snacks    [provider]   Physical Exam: Vitals:   12/12/20 1230 12/12/20 1300 12/12/20 1427 12/12/20 1650  BP: (!) 168/64 (!) 147/73 135/64 (!) 146/65  Pulse: (!) 101 98 96 73  Resp: (!) 21 (!) 23 16 17   Temp:  99.8 F (37.7 C) 98.5  F (36.9 C) 99.2 F (37.3 C)  TempSrc:  Oral Oral Oral  SpO2: 96% 97% 100% 97%  Weight:      Height:       Constitutional: appears age-appropriate, frail, chronically ill, cachectic, NAD, calm, comfortable Eyes: PERRL, lids and conjunctivae normal ENMT: Mucous membranes are moist. Posterior pharynx clear of any exudate or lesions. Age-appropriate dentition. Hearing appropriate Neck: normal, supple, no masses, no thyromegaly Respiratory: clear to auscultation bilaterally, no wheezing, no crackles. Normal respiratory effort. No accessory muscle use.  Cardiovascular: Regular  rate and rhythm, no murmurs / rubs / gallops. No extremity edema. 2+ pedal pulses. No carotid bruits.  Left upper extremity fistula Abdomen: no tenderness, no masses palpated, no hepatosplenomegaly. Bowel sounds positive.  Musculoskeletal: no clubbing / cyanosis. No joint deformity upper and lower extremities. Good ROM, no contractures, no atrophy. Normal muscle tone.  Skin: no rashes, lesions, ulcers. No induration Neurologic: Sensation intact. Strength 5/5 in all 4.  Psychiatric: Normal judgment and insight. Alert and oriented x 3. Normal mood.   EKG: independently reviewed, showing sinus tachycardia with rate of 104, QTc 475  Chest x-ray on Admission: I personally reviewed and I agree with radiologist reading as below.  DG Chest 1 View  Result Date: 12/12/2020 CLINICAL DATA:  Short of breath. EXAM: CHEST  1 VIEW COMPARISON:  09/18/2019 and older exams. FINDINGS: Cardiac silhouette is mildly enlarged. No mediastinal or hilar masses. Airspace consolidation in the left mid lung. Additional hazy airspace opacity is noted in both lung bases. The remainder of the lungs is clear. Lungs are hyperexpanded. Blunted left hemidiaphragm consistent with a small effusion. No pneumothorax. Left arm vascular stent, stable. Skeletal structures are grossly intact. IMPRESSION: 1. Airspace consolidation in the left mid lung with additional smaller areas of hazy opacity in both lung bases. Suspect pneumonia in the left mid lung. Lung base opacities may reflect additional infiltrate or be due to atelectasis. Probable small left effusion. No convincing pulmonary edema. Electronically Signed   By: Lajean Manes M.D.   On: 12/12/2020 12:03   DG Foot Complete Right  Result Date: 12/11/2020 Please see detailed radiograph report in office note.  Labs on Admission: I have personally reviewed following labs  CBC: Recent Labs  Lab 12/12/20 1132  WBC 9.5  NEUTROABS 8.2*  HGB 11.1*  HCT 34.5*  MCV 102.4*  PLT 825    Basic Metabolic Panel: Recent Labs  Lab 12/12/20 1132  NA 140  K 4.3  CL 99  CO2 27  GLUCOSE 83  BUN 29*  CREATININE 6.97*  CALCIUM 9.5   GFR: Estimated Creatinine Clearance: 10.6 mL/min (A) (by C-G formula based on SCr of 6.97 mg/dL (H)).  Liver Function Tests: Recent Labs  Lab 12/12/20 1132  AST 11*  ALT 8  ALKPHOS 97  BILITOT 0.7  PROT 7.7  ALBUMIN 3.8   Keison Glendinning N Agapita Savarino D.O. [facility]  If 7PM-7AM, please contact overnight-coverage provider If 7AM-7PM, please contact day coverage provider [email or web address]  12/12/2020, 7:42 PM

## 2020-12-13 DIAGNOSIS — R778 Other specified abnormalities of plasma proteins: Secondary | ICD-10-CM | POA: Diagnosis present

## 2020-12-13 DIAGNOSIS — N2581 Secondary hyperparathyroidism of renal origin: Secondary | ICD-10-CM | POA: Diagnosis present

## 2020-12-13 DIAGNOSIS — J181 Lobar pneumonia, unspecified organism: Secondary | ICD-10-CM

## 2020-12-13 DIAGNOSIS — J440 Chronic obstructive pulmonary disease with acute lower respiratory infection: Secondary | ICD-10-CM | POA: Diagnosis present

## 2020-12-13 DIAGNOSIS — I120 Hypertensive chronic kidney disease with stage 5 chronic kidney disease or end stage renal disease: Secondary | ICD-10-CM | POA: Diagnosis present

## 2020-12-13 DIAGNOSIS — Z6823 Body mass index (BMI) 23.0-23.9, adult: Secondary | ICD-10-CM | POA: Diagnosis not present

## 2020-12-13 DIAGNOSIS — R54 Age-related physical debility: Secondary | ICD-10-CM | POA: Diagnosis present

## 2020-12-13 DIAGNOSIS — E785 Hyperlipidemia, unspecified: Secondary | ICD-10-CM | POA: Diagnosis present

## 2020-12-13 DIAGNOSIS — J189 Pneumonia, unspecified organism: Secondary | ICD-10-CM | POA: Diagnosis present

## 2020-12-13 DIAGNOSIS — Z791 Long term (current) use of non-steroidal anti-inflammatories (NSAID): Secondary | ICD-10-CM | POA: Diagnosis not present

## 2020-12-13 DIAGNOSIS — N186 End stage renal disease: Secondary | ICD-10-CM | POA: Diagnosis present

## 2020-12-13 DIAGNOSIS — Z20822 Contact with and (suspected) exposure to covid-19: Secondary | ICD-10-CM | POA: Diagnosis present

## 2020-12-13 DIAGNOSIS — R64 Cachexia: Secondary | ICD-10-CM | POA: Diagnosis present

## 2020-12-13 DIAGNOSIS — A419 Sepsis, unspecified organism: Principal | ICD-10-CM

## 2020-12-13 DIAGNOSIS — E875 Hyperkalemia: Secondary | ICD-10-CM | POA: Diagnosis present

## 2020-12-13 DIAGNOSIS — Z79899 Other long term (current) drug therapy: Secondary | ICD-10-CM | POA: Diagnosis not present

## 2020-12-13 DIAGNOSIS — D631 Anemia in chronic kidney disease: Secondary | ICD-10-CM | POA: Diagnosis present

## 2020-12-13 DIAGNOSIS — Z7722 Contact with and (suspected) exposure to environmental tobacco smoke (acute) (chronic): Secondary | ICD-10-CM | POA: Diagnosis present

## 2020-12-13 DIAGNOSIS — Z992 Dependence on renal dialysis: Secondary | ICD-10-CM | POA: Diagnosis not present

## 2020-12-13 DIAGNOSIS — N269 Renal sclerosis, unspecified: Secondary | ICD-10-CM | POA: Diagnosis present

## 2020-12-13 DIAGNOSIS — Z940 Kidney transplant status: Secondary | ICD-10-CM | POA: Diagnosis not present

## 2020-12-13 LAB — CBC
HCT: 29 % — ABNORMAL LOW (ref 39.0–52.0)
Hemoglobin: 9.7 g/dL — ABNORMAL LOW (ref 13.0–17.0)
MCH: 34 pg (ref 26.0–34.0)
MCHC: 33.4 g/dL (ref 30.0–36.0)
MCV: 101.8 fL — ABNORMAL HIGH (ref 80.0–100.0)
Platelets: 204 10*3/uL (ref 150–400)
RBC: 2.85 MIL/uL — ABNORMAL LOW (ref 4.22–5.81)
RDW: 12.5 % (ref 11.5–15.5)
WBC: 9.9 10*3/uL (ref 4.0–10.5)
nRBC: 0 % (ref 0.0–0.2)

## 2020-12-13 LAB — BASIC METABOLIC PANEL
Anion gap: 11 (ref 5–15)
BUN: 43 mg/dL — ABNORMAL HIGH (ref 8–23)
CO2: 26 mmol/L (ref 22–32)
Calcium: 9.4 mg/dL (ref 8.9–10.3)
Chloride: 99 mmol/L (ref 98–111)
Creatinine, Ser: 8.91 mg/dL — ABNORMAL HIGH (ref 0.61–1.24)
GFR, Estimated: 6 mL/min — ABNORMAL LOW (ref >60)
Glucose, Bld: 92 mg/dL (ref 70–99)
Potassium: 4.8 mmol/L (ref 3.5–5.1)
Sodium: 136 mmol/L (ref 135–145)

## 2020-12-13 LAB — GLUCOSE, CAPILLARY: Glucose-Capillary: 123 mg/dL — ABNORMAL HIGH (ref 70–99)

## 2020-12-13 LAB — HEPATITIS B SURFACE ANTIGEN: Hepatitis B Surface Ag: NONREACTIVE

## 2020-12-13 LAB — HIV ANTIBODY (ROUTINE TESTING W REFLEX): HIV Screen 4th Generation wRfx: NONREACTIVE

## 2020-12-13 LAB — MAGNESIUM: Magnesium: 2 mg/dL (ref 1.7–2.4)

## 2020-12-13 MED ORDER — AZITHROMYCIN 500 MG PO TABS
500.0000 mg | ORAL_TABLET | Freq: Every day | ORAL | Status: DC
  Administered 2020-12-13 – 2020-12-14 (×2): 500 mg via ORAL
  Filled 2020-12-13 (×2): qty 1

## 2020-12-13 NOTE — Progress Notes (Signed)
PROGRESS NOTE    Scott Castro  PIR:[id number] DOB: [birth year] DOA: 12/12/2020 PCP: Franciso Bend, MD  128A/128A-AA   Assessment & Plan:   Principal Problem:   Sepsis ([place]) Active Problems:   COPD (chronic obstructive pulmonary disease) ([place])   End stage renal disease on dialysis ([place])   Hyperkalemia   Arteriovenous fistula (H[facility])   FSGS (focal segmental glomerulosclerosis)   Hypertension   Kidney transplanted   Pneumonia   Scott Castro is a 65 y.o. male with medical history significant for hypertension, end-stage renal disease on hemodialysis, COPD/asthma secondary to secondhand smoke, presents to the emergency department for chief concerns of shortness of breath, found to have PNA.   Sepsis 2/2 PNA - increased lactic acid 2.2, increased respiration rate, increased heart rate, with source of pneumonia --CXR showed "Airspace consolidation in the left mid lung with additional smaller areas of hazy opacity in both lung bases." - Pro-Cal was positive at 2.21  - COVID was negative - Blood cultures x2 were ordered, patient declines - Status post azithromycin and ceftriaxone IV per EDP Plan: --cont ceftriaxone and oral azithromycin  Elevated troponin, likely baseline 2/2 ESRD status -trop 60's-70's, no chest pain. --low clinical suspicion for ACS as patient denies chest pain  ESRD on HD TTS - nephrology has been consulted for continuation of hemodialysis via left antecubital AV access - Patient has history of transplant, and the transplanted kidney developed FSGS - patient is compliant with dialysis session and has not missed dialysis in the last week - Patient is anuric Plan: --iHD per nephrology  Hypertension -elevated, patient did not take his antihypertensive prior to presentation due to feeling poorly - on home amlodipine 10 mg daily, metoprolol tartrate 100 mg twice daily, minoxidil 2.5 mg p.o. twice daily --cont home regimen  Protein/calorie malnutrition-  moderate to severe --consult dietitian  COPD -appears to be compensated at this time it does not appear to be in exacerbation - Per pulmonology note, patient is not on any inhalers, and doing well   DVT prophylaxis: Heparin SQ Code Status: Full code  Family Communication:  Level of care: Med-Surg Dispo:   The patient is from: home Anticipated d/c is to: home Anticipated d/c date is: 1-2 days Patient currently is not medically ready to d/c due to: sepsis 2/2 PNA on IV abx   Subjective and Interval History:  Pt reported feeling better, breathing better.     Objective: Vitals:   12/13/20 1215 12/13/20 1230 12/13/20 1240 12/13/20 1606  BP: (!) 169/65 (!) 171/64 (!) 155/58 (!) 147/102  Pulse: 83 82 88 67  Resp: 18 18 17 17   Temp:   98.7 F (37.1 C) (!) 100.7 F (38.2 C)  TempSrc:   Oral Oral  SpO2: 95% 95% 95% 98%  Weight:      Height:        Intake/Output Summary (Last 24 hours) at 12/13/2020 1740 Last data filed at 12/13/2020 1240 Gross per 24 hour  Intake --  Output 1500 ml  Net -1500 ml   Filed Weights   12/12/20 1108  Weight: 70.7 kg    Examination:   Constitutional: NAD, AAOx3 HEENT: conjunctivae and lids normal, EOMI CV: No cyanosis.   RESP: normal respiratory effort, on RA Extremities: No effusions, edema in BLE SKIN: warm, dry Neuro: II - XII grossly intact.   Psych: Normal mood and affect.  Appropriate judgement and reason   Data Reviewed: I have personally reviewed following labs and imaging studies  CBC: Recent Labs  Lab 12/12/20 1132 12/13/20 0412  WBC 9.5 9.9  NEUTROABS 8.2*  --   HGB 11.1* 9.7*  HCT 34.5* 29.0*  MCV 102.4* 101.8*  PLT 192 366   Basic Metabolic Panel: Recent Labs  Lab 12/12/20 1132 12/13/20 0412  NA 140 136  K 4.3 4.8  CL 99 99  CO2 27 26  GLUCOSE 83 92  BUN 29* 43*  CREATININE 6.97* 8.91*  CALCIUM 9.5 9.4  MG  --  2.0   GFR: Estimated Creatinine Clearance: 8.3 mL/min (A) (by C-G formula based on SCr of  8.91 mg/dL (H)). Liver Function Tests: Recent Labs  Lab 12/12/20 1132  AST 11*  ALT 8  ALKPHOS 97  BILITOT 0.7  PROT 7.7  ALBUMIN 3.8   No results for input(s): LIPASE, AMYLASE in the last 168 hours. No results for input(s): AMMONIA in the last 168 hours. Coagulation Profile: No results for input(s): INR, PROTIME in the last 168 hours. Cardiac Enzymes: No results for input(s): CKTOTAL, CKMB, CKMBINDEX, TROPONINI in the last 168 hours. BNP (last 3 results) No results for input(s): PROBNP in the last 8760 hours. HbA1C: No results for input(s): HGBA1C in the last 72 hours. CBG: Recent Labs  Lab 12/13/20 1604  GLUCAP 123*   Lipid Profile: No results for input(s): CHOL, HDL, LDLCALC, TRIG, CHOLHDL, LDLDIRECT in the last 72 hours. Thyroid Function Tests: No results for input(s): TSH, T4TOTAL, FREET4, T3FREE, THYROIDAB in the last 72 hours. Anemia Panel: No results for input(s): VITAMINB12, FOLATE, FERRITIN, TIBC, IRON, RETICCTPCT in the last 72 hours. Sepsis Labs: Recent Labs  Lab 12/12/20 1132 12/12/20 1453  PROCALCITON  --  2.21  LATICACIDVEN 2.2* 1.8    Recent Results (from the past 240 hour(s))  Blood culture (routine x 2)     Status: None (Preliminary result)   Collection Time: 12/12/20 11:32 AM   Specimen: BLOOD  Result Value Ref Range Status   Specimen Description BLOOD  RAC  Final   Special Requests   Final    BOTTLES DRAWN AEROBIC AND ANAEROBIC Blood Culture results may not be optimal due to an inadequate volume of blood received in culture bottles   Culture   Final    NO GROWTH < 24 HOURS Performed at [facility], [address] [place], [place] [address]    Report Status PENDING  Incomplete  Blood culture (routine x 2)     Status: None (Preliminary result)   Collection Time: 12/12/20 11:32 AM   Specimen: BLOOD  Result Value Ref Range Status   Specimen Description BLOOD RHD  Final   Special Requests   Final    BOTTLES DRAWN AEROBIC AND  ANAEROBIC Blood Culture results may not be optimal due to an inadequate volume of blood received in culture bottles   Culture   Final    NO GROWTH < 24 HOURS Performed at [facility], [address], [place], [place] [address]    Report Status PENDING  Incomplete  Resp Panel by RT-PCR (Flu A&B, Covid) Nasopharyngeal Swab     Status: None   Collection Time: 12/12/20 11:32 AM   Specimen: Nasopharyngeal Swab; Nasopharyngeal(NP) swabs in vial transport medium  Result Value Ref Range Status   SARS Coronavirus 2 by RT PCR NEGATIVE NEGATIVE Final    Comment: (NOTE) SARS-CoV-2 target nucleic acids are NOT DETECTED.  The SARS-CoV-2 RNA is generally detectable in upper respiratory specimens during the acute phase of infection. The lowest concentration of  SARS-CoV-2 viral copies this assay can detect is 138 copies/mL. A negative result does not preclude SARS-Cov-2 infection and should not be used as the sole basis for treatment or other patient management decisions. A negative result may occur with  improper specimen collection/handling, submission of specimen other than nasopharyngeal swab, presence of viral mutation(s) within the areas targeted by this assay, and inadequate number of viral copies(<138 copies/mL). A negative result must be combined with clinical observations, patient history, and epidemiological information. The expected result is Negative.  Fact Sheet for Patients:  EntrepreneurPulse.com.au  Fact Sheet for Healthcare Providers:  IncredibleEmployment.be  This test is no t yet approved or cleared by the [place] FDA and  has been authorized for detection and/or diagnosis of SARS-CoV-2 by FDA under an Emergency Use Authorization (EUA). This EUA will remain  in effect (meaning this test can be used) for the duration of the COVID-19 declaration under Section 564(b)(1) of the Act, 21 U.S.C.section 360bbb-3(b)(1), unless the  authorization is terminated  or revoked sooner.       Influenza A by PCR NEGATIVE NEGATIVE Final   Influenza B by PCR NEGATIVE NEGATIVE Final    Comment: (NOTE) The Xpert Xpress SARS-CoV-2/FLU/RSV plus assay is intended as an aid in the diagnosis of influenza from Nasopharyngeal swab specimens and should not be used as a sole basis for treatment. Nasal washings and aspirates are unacceptable for Xpert Xpress SARS-CoV-2/FLU/RSV testing.  Fact Sheet for Patients: EntrepreneurPulse.com.au  Fact Sheet for Healthcare Providers: IncredibleEmployment.be  This test is not yet approved or cleared by the [place] FDA and has been authorized for detection and/or diagnosis of SARS-CoV-2 by FDA under an Emergency Use Authorization (EUA). This EUA will remain in effect (meaning this test can be used) for the duration of the COVID-19 declaration under Section 564(b)(1) of the Act, 21 U.S.C. section 360bbb-3(b)(1), unless the authorization is terminated or revoked.  Performed at [facility], [address] [place], [place] [address]       Radiology Studies: DG Chest 1 View  Result Date: 12/12/2020 CLINICAL DATA:  Short of breath. EXAM: CHEST  1 VIEW COMPARISON:  09/18/2019 and older exams. FINDINGS: Cardiac silhouette is mildly enlarged. No mediastinal or hilar masses. Airspace consolidation in the left mid lung. Additional hazy airspace opacity is noted in both lung bases. The remainder of the lungs is clear. Lungs are hyperexpanded. Blunted left hemidiaphragm consistent with a small effusion. No pneumothorax. Left arm vascular stent, stable. Skeletal structures are grossly intact. IMPRESSION: 1. Airspace consolidation in the left mid lung with additional smaller areas of hazy opacity in both lung bases. Suspect pneumonia in the left mid lung. Lung base opacities may reflect additional infiltrate or be due to atelectasis. Probable small left  effusion. No convincing pulmonary edema. Electronically Signed   By: Lajean Manes M.D.   On: 12/12/2020 12:03     Scheduled Meds: . amLODipine  10 mg Oral Q0600  . azithromycin  500 mg Oral Daily  . Chlorhexidine Gluconate Cloth  6 each Topical Q0600  . heparin  5,000 Units Subcutaneous Q8H  . metoprolol tartrate  100 mg Oral BID  . minoxidil  2.5 mg Oral BID  . pantoprazole  40 mg Oral Daily  . sevelamer carbonate  2,400 mg Oral TID WC   Continuous Infusions: . cefTRIAXone (ROCEPHIN)  IV Stopped (12/13/20 0932)     LOS: 0 days     Enzo Bi, MD [facility] If 7PM-7AM, please contact night-coverage 12/13/2020,  5:40 PM

## 2020-12-13 NOTE — Progress Notes (Signed)
[facility]  ROUNDING NOTE   Subjective:   [name] was admitted to [facility] on 12/12/2020 for Sepsis [facility]) [A41.9] Sepsis due to pneumonia ([place]) [J18.9, A41.9]  Seen and examined on Hemodialysis treatment. Tolerating treatment well.     HEMODIALYSIS FLOWSHEET:  Blood Flow Rate (mL/min): 400 mL/min Arterial Pressure (mmHg): -170 mmHg Venous Pressure (mmHg): 130 mmHg Transmembrane Pressure (mmHg): 60 mmHg Ultrafiltration Rate (mL/min): 670 mL/min Dialysate Flow Rate (mL/min): 600 ml/min Conductivity: Machine : 14 Conductivity: Machine : 14      Objective:  Vital signs in last 24 hours:  Temp:  [98.3 F (36.8 C)-99.8 F (37.7 C)] 98.9 F (37.2 C) (05/07 0945) Pulse Rate:  [73-98] 83 (05/07 1215) Resp:  [16-23] 18 (05/07 1215) BP: (135-169)/(61-82) 169/65 (05/07 1215) SpO2:  [92 %-100 %] 95 % (05/07 1215)  Weight change:  Filed Weights   12/12/20 1108  Weight: 70.7 kg    Intake/Output: No intake/output data recorded.   Intake/Output this shift:  No intake/output data recorded.  Physical Exam: General: NAD,   Head: Normocephalic, atraumatic. Moist oral mucosal membranes  Eyes: Anicteric, PERRL  Neck: Supple, trachea midline  Lungs:  Left sided crackles  Heart: Regular rate and rhythm  Abdomen:  Soft, nontender,   Extremities:  no peripheral edema.  Neurologic: Nonfocal, moving all four extremities  Skin: No lesions  Access: Left AVF    Basic Metabolic Panel: Recent Labs  Lab 12/12/20 1132 12/13/20 0412  NA 140 136  K 4.3 4.8  CL 99 99  CO2 27 26  GLUCOSE 83 92  BUN 29* 43*  CREATININE 6.97* 8.91*  CALCIUM 9.5 9.4  MG  --  2.0    Liver Function Tests: Recent Labs  Lab 12/12/20 1132  AST 11*  ALT 8  ALKPHOS 97  BILITOT 0.7  PROT 7.7  ALBUMIN 3.8   No results for input(s): LIPASE, AMYLASE in the last 168 hours. No results for input(s): AMMONIA in the last 168 hours.  CBC: Recent Labs  Lab 12/12/20 1132  12/13/20 0412  WBC 9.5 9.9  NEUTROABS 8.2*  --   HGB 11.1* 9.7*  HCT 34.5* 29.0*  MCV 102.4* 101.8*  PLT 192 204    Cardiac Enzymes: No results for input(s): CKTOTAL, CKMB, CKMBINDEX, TROPONINI in the last 168 hours.  BNP: Invalid input(s): POCBNP  CBG: No results for input(s): GLUCAP in the last 168 hours.  Microbiology: Results for orders placed or performed during the hospital encounter of 12/12/20  Blood culture (routine x 2)     Status: None (Preliminary result)   Collection Time: 12/12/20 11:32 AM   Specimen: BLOOD  Result Value Ref Range Status   Specimen Description BLOOD  RAC  Final   Special Requests   Final    BOTTLES DRAWN AEROBIC AND ANAEROBIC Blood Culture results may not be optimal due to an inadequate volume of blood received in culture bottles   Culture   Final    NO GROWTH < 24 HOURS Performed at [facility], [address] [place], [place] [address]    Report Status PENDING  Incomplete  Blood culture (routine x 2)     Status: None (Preliminary result)   Collection Time: 12/12/20 11:32 AM   Specimen: BLOOD  Result Value Ref Range Status   Specimen Description BLOOD RHD  Final   Special Requests   Final    BOTTLES DRAWN AEROBIC AND ANAEROBIC Blood Culture results may not be optimal due to  an inadequate volume of blood received in culture bottles   Culture   Final    NO GROWTH < 24 HOURS Performed at [facility], [address] [place], [place] [address]    Report Status PENDING  Incomplete  Resp Panel by RT-PCR (Flu A&B, Covid) Nasopharyngeal Swab     Status: None   Collection Time: 12/12/20 11:32 AM   Specimen: Nasopharyngeal Swab; Nasopharyngeal(NP) swabs in vial transport medium  Result Value Ref Range Status   SARS Coronavirus 2 by RT PCR NEGATIVE NEGATIVE Final    Comment: (NOTE) SARS-CoV-2 target nucleic acids are NOT DETECTED.  The SARS-CoV-2 RNA is generally detectable in upper respiratory specimens during the  acute phase of infection. The lowest concentration of SARS-CoV-2 viral copies this assay can detect is 138 copies/mL. A negative result does not preclude SARS-Cov-2 infection and should not be used as the sole basis for treatment or other patient management decisions. A negative result may occur with  improper specimen collection/handling, submission of specimen other than nasopharyngeal swab, presence of viral mutation(s) within the areas targeted by this assay, and inadequate number of viral copies(<138 copies/mL). A negative result must be combined with clinical observations, patient history, and epidemiological information. The expected result is Negative.  Fact Sheet for Patients:  EntrepreneurPulse.com.au  Fact Sheet for Healthcare Providers:  IncredibleEmployment.be  This test is no t yet approved or cleared by the [place] FDA and  has been authorized for detection and/or diagnosis of SARS-CoV-2 by FDA under an Emergency Use Authorization (EUA). This EUA will remain  in effect (meaning this test can be used) for the duration of the COVID-19 declaration under Section 564(b)(1) of the Act, 21 U.S.C.section 360bbb-3(b)(1), unless the authorization is terminated  or revoked sooner.       Influenza A by PCR NEGATIVE NEGATIVE Final   Influenza B by PCR NEGATIVE NEGATIVE Final    Comment: (NOTE) The Xpert Xpress SARS-CoV-2/FLU/RSV plus assay is intended as an aid in the diagnosis of influenza from Nasopharyngeal swab specimens and should not be used as a sole basis for treatment. Nasal washings and aspirates are unacceptable for Xpert Xpress SARS-CoV-2/FLU/RSV testing.  Fact Sheet for Patients: EntrepreneurPulse.com.au  Fact Sheet for Healthcare Providers: IncredibleEmployment.be  This test is not yet approved or cleared by the [place] FDA and has been authorized for detection and/or  diagnosis of SARS-CoV-2 by FDA under an Emergency Use Authorization (EUA). This EUA will remain in effect (meaning this test can be used) for the duration of the COVID-19 declaration under Section 564(b)(1) of the Act, 21 U.S.C. section 360bbb-3(b)(1), unless the authorization is terminated or revoked.  Performed at [facility], [address] [place], [place] [address]     Coagulation Studies: No results for input(s): LABPROT, INR in the last 72 hours.  Urinalysis: No results for input(s): COLORURINE, LABSPEC, PHURINE, GLUCOSEU, HGBUR, BILIRUBINUR, KETONESUR, PROTEINUR, UROBILINOGEN, NITRITE, LEUKOCYTESUR in the last 72 hours.  Invalid input(s): APPERANCEUR    Imaging: DG Chest 1 View  Result Date: 12/12/2020 CLINICAL DATA:  Short of breath. EXAM: CHEST  1 VIEW COMPARISON:  09/18/2019 and older exams. FINDINGS: Cardiac silhouette is mildly enlarged. No mediastinal or hilar masses. Airspace consolidation in the left mid lung. Additional hazy airspace opacity is noted in both lung bases. The remainder of the lungs is clear. Lungs are hyperexpanded. Blunted left hemidiaphragm consistent with a small effusion. No pneumothorax. Left arm vascular stent, stable. Skeletal structures are grossly intact. IMPRESSION: 1. Airspace  consolidation in the left mid lung with additional smaller areas of hazy opacity in both lung bases. Suspect pneumonia in the left mid lung. Lung base opacities may reflect additional infiltrate or be due to atelectasis. Probable small left effusion. No convincing pulmonary edema. Electronically Signed   By: Lajean Manes M.D.   On: 12/12/2020 12:03     Medications:   . cefTRIAXone (ROCEPHIN)  IV Stopped (12/13/20 0932)   . amLODipine  10 mg Oral Q0600  . azithromycin  500 mg Oral Daily  . Chlorhexidine Gluconate Cloth  6 each Topical Q0600  . heparin  5,000 Units Subcutaneous Q8H  . metoprolol tartrate  100 mg Oral BID  . minoxidil  2.5 mg Oral BID  .  pantoprazole  40 mg Oral Daily  . sevelamer carbonate  2,400 mg Oral TID WC     Assessment/ Plan:  [name] is a 65 y.o. white male with end stage renal disease on hemodialysis secondary to FSGS, history of kidney transplant, hypertension, Barrett's esophagus, hyperlipidemia who is admitted to [facility] on 12/12/2020 for Sepsis ([place]) [A41.9] Sepsis due to pneumonia ([place]) [J18.9, A41.9]  [facility] TTS [place]. AVF  1. End Stage Renal Disease: on hemodialysis. Seen and examined on hemodialysis treatment. Tolerating treatment well. Continue TTS schedule.   2. Hypertension: patient seems to not be complaint with his medications. Home regimen of amlodipine, minoxidil, and metoprolol.  Elevated during treatment 169/65  3. Anemia of chronic kidney disease: hemoglobin 9.7. Macrocytic.  - EPO as outpatient.   4. Secondary Hyperparathyroidism:  - continue sevelamer    LOS: 0 Dalary Hollar 5/7/202212:34 PM

## 2020-12-14 LAB — BASIC METABOLIC PANEL
Anion gap: 12 (ref 5–15)
BUN: 31 mg/dL — ABNORMAL HIGH (ref 8–23)
CO2: 27 mmol/L (ref 22–32)
Calcium: 9.6 mg/dL (ref 8.9–10.3)
Chloride: 100 mmol/L (ref 98–111)
Creatinine, Ser: 6.55 mg/dL — ABNORMAL HIGH (ref 0.61–1.24)
GFR, Estimated: 9 mL/min — ABNORMAL LOW (ref >60)
Glucose, Bld: 94 mg/dL (ref 70–99)
Potassium: 4.3 mmol/L (ref 3.5–5.1)
Sodium: 139 mmol/L (ref 135–145)

## 2020-12-14 LAB — CBC
HCT: 29.5 % — ABNORMAL LOW (ref 39.0–52.0)
Hemoglobin: 9.9 g/dL — ABNORMAL LOW (ref 13.0–17.0)
MCH: 33.8 pg (ref 26.0–34.0)
MCHC: 33.6 g/dL (ref 30.0–36.0)
MCV: 100.7 fL — ABNORMAL HIGH (ref 80.0–100.0)
Platelets: 171 10*3/uL (ref 150–400)
RBC: 2.93 MIL/uL — ABNORMAL LOW (ref 4.22–5.81)
RDW: 12.1 % (ref 11.5–15.5)
WBC: 6.9 10*3/uL (ref 4.0–10.5)
nRBC: 0 % (ref 0.0–0.2)

## 2020-12-14 LAB — MAGNESIUM: Magnesium: 1.9 mg/dL (ref 1.7–2.4)

## 2020-12-14 LAB — PROCALCITONIN: Procalcitonin: 4.75 ng/mL

## 2020-12-14 MED ORDER — MELOXICAM 15 MG PO TABS: 15.0000 mg | ORAL_TABLET | Freq: Every day | ORAL | 1 refills | Status: DC | PRN

## 2020-12-14 MED ORDER — NEPRO/CARBSTEADY PO LIQD
237.0000 mL | Freq: Two times a day (BID) | ORAL | Status: DC
  Administered 2020-12-14: 11:00:00 237 mL via ORAL

## 2020-12-14 MED ORDER — RENA-VITE PO TABS
1.0000 | ORAL_TABLET | Freq: Every day | ORAL | Status: DC
  Filled 2020-12-14: qty 1

## 2020-12-14 MED ORDER — LEVOFLOXACIN 500 MG PO TABS: 500.0000 mg | ORAL_TABLET | Freq: Every day | ORAL | 0 refills | Status: AC

## 2020-12-14 NOTE — Progress Notes (Signed)
Patient needing a ride home. CSW confirmed home address with patient then called and arranged [facility]. [facility] is on the way as of 2:20 and will call [facility]C when they arrive. They will be in a white van. RN Stevie Kern notified.  Scott Castro, [place]

## 2020-12-14 NOTE — Progress Notes (Signed)
Initial Nutrition Assessment  DOCUMENTATION CODES:   Not applicable  INTERVENTION:   Nepro Shake po BID, each supplement provides 425 kcal and 19 grams protein  Add Renal MVI daily  If po intake inadequate on follow-up, recommend liberalizing diet   NUTRITION DIAGNOSIS:   Increased nutrient needs related to chronic illness,acute illness as evidenced by estimated needs.  GOAL:   Patient will meet greater than or equal to 90% of their needs   MONITOR:   PO intake,Supplement acceptance,Labs,Weight trends  REASON FOR ASSESSMENT:   Consult Assessment of nutrition requirement/status  ASSESSMENT:   65 yo male admitted with sepsis secondary to PNA, PMH includes HTN, ESRD on HD with hx of renal transplant, COPD, Barrett's esophagus, HLD   No recorded po intake.   Unsure of EDW. Current wt 70.7 kg. No weight loss trend per weight encounters.   Pt on renvela, no phosphorus this admission  Last iHD yesterday, net UF 1.5 L. Pt compliant with HD as outpatient   Labs: reviewed Meds: renvela   Diet Order:   Diet Order            Diet renal with fluid restriction Fluid restriction: 1200 mL Fluid; Room service appropriate? Yes; Fluid consistency: Thin  Diet effective now                 EDUCATION NEEDS:   Not appropriate for education at this time  Skin:  Skin Assessment: Reviewed RN Assessment  Last BM:  no documented BM  Height:   Ht Readings from Last 1 Encounters:  12/12/20 5\' 9"  (1.753 m)    Weight:   Wt Readings from Last 1 Encounters:  12/12/20 70.7 kg     BMI:  Body mass index is 23.02 kg/m.  Estimated Nutritional Needs:   Kcal:  2000-2200 kcals  Protein:  100-115 g  Fluid:  1000 mL plus UOP   Kerman Passey MS, RDN, LDN, CNSC Registered Dietitian III Clinical Nutrition RD Pager and On-Call Pager Number Located in [place]

## 2020-12-14 NOTE — Progress Notes (Signed)
[facility]  ROUNDING NOTE   Subjective:   The patient is upset regarding the renal diet.  Reports he tolerated dialysis well yesterday. Denies any shortness of breath, edema, cramping. Admits to a cough but feels that it is improving. Patient is unhappy that he had not received his metoprolol yet today.   Objective:  Vital signs in last 24 hours:  Temp:  [98.1 F (36.7 C)-100.7 F (38.2 C)] 98.1 F (36.7 C) (05/08 0810) Pulse Rate:  [67-107] 107 (05/08 0810) Resp:  [16-23] 20 (05/08 0810) BP: (120-171)/(58-102) 142/70 (05/08 0810) SpO2:  [91 %-98 %] 92 % (05/08 0810)  Weight change:  Filed Weights   12/12/20 1108  Weight: 70.7 kg    Intake/Output: I/O last 3 completed shifts: In: -  Out: 1500 [Other:1500]   Intake/Output this shift:  No intake/output data recorded.  Physical Exam: General: NAD, sitting up in bed.   Head: Normocephalic, atraumatic. Moist oral mucosal membranes  Eyes: Anicteric, PERRL  Neck: Supple, trachea midline  Lungs:  Crackles left sided, cough   Heart: Regular rate and rhythm  Abdomen:  Soft, nontender,   Extremities:  No peripheral edema.  Neurologic: Alert and oriented.   Skin: No lesions  Access: Left AVF    Basic Metabolic Panel: Recent Labs  Lab 12/12/20 1132 12/13/20 0412 12/14/20 0443  NA 140 136 139  K 4.3 4.8 4.3  CL 99 99 100  CO2 27 26 27   GLUCOSE 83 92 94  BUN 29* 43* 31*  CREATININE 6.97* 8.91* 6.55*  CALCIUM 9.5 9.4 9.6  MG  --  2.0 1.9    Liver Function Tests: Recent Labs  Lab 12/12/20 1132  AST 11*  ALT 8  ALKPHOS 97  BILITOT 0.7  PROT 7.7  ALBUMIN 3.8   No results for input(s): LIPASE, AMYLASE in the last 168 hours. No results for input(s): AMMONIA in the last 168 hours.  CBC: Recent Labs  Lab 12/12/20 1132 12/13/20 0412 12/14/20 0443  WBC 9.5 9.9 6.9  NEUTROABS 8.2*  --   --   HGB 11.1* 9.7* 9.9*  HCT 34.5* 29.0* 29.5*  MCV 102.4* 101.8* 100.7*  PLT 192 204 171    Cardiac  Enzymes: No results for input(s): CKTOTAL, CKMB, CKMBINDEX, TROPONINI in the last 168 hours.  BNP: Invalid input(s): POCBNP  CBG: Recent Labs  Lab 12/13/20 8546  GLUCAP 45*    Microbiology: Results for orders placed or performed during the hospital encounter of 12/12/20  Blood culture (routine x 2)     Status: None (Preliminary result)   Collection Time: 12/12/20 11:32 AM   Specimen: BLOOD  Result Value Ref Range Status   Specimen Description BLOOD  RAC  Final   Special Requests   Final    BOTTLES DRAWN AEROBIC AND ANAEROBIC Blood Culture results may not be optimal due to an inadequate volume of blood received in culture bottles   Culture   Final    NO GROWTH 2 DAYS Performed at [facility], [address] [place], [place] [address]    Report Status PENDING  Incomplete  Blood culture (routine x 2)     Status: None (Preliminary result)   Collection Time: 12/12/20 11:32 AM   Specimen: BLOOD  Result Value Ref Range Status   Specimen Description BLOOD RHD  Final   Special Requests   Final    BOTTLES DRAWN AEROBIC AND ANAEROBIC Blood Culture results may not be optimal due to an inadequate volume of  blood received in culture bottles   Culture   Final    NO GROWTH 2 DAYS Performed at [facility], [address] [place], [place] [address]    Report Status PENDING  Incomplete  Resp Panel by RT-PCR (Flu A&B, Covid) Nasopharyngeal Swab     Status: None   Collection Time: 12/12/20 11:32 AM   Specimen: Nasopharyngeal Swab; Nasopharyngeal(NP) swabs in vial transport medium  Result Value Ref Range Status   SARS Coronavirus 2 by RT PCR NEGATIVE NEGATIVE Final    Comment: (NOTE) SARS-CoV-2 target nucleic acids are NOT DETECTED.  The SARS-CoV-2 RNA is generally detectable in upper respiratory specimens during the acute phase of infection. The lowest concentration of SARS-CoV-2 viral copies this assay can detect is 138 copies/mL. A negative result does not  preclude SARS-Cov-2 infection and should not be used as the sole basis for treatment or other patient management decisions. A negative result may occur with  improper specimen collection/handling, submission of specimen other than nasopharyngeal swab, presence of viral mutation(s) within the areas targeted by this assay, and inadequate number of viral copies(<138 copies/mL). A negative result must be combined with clinical observations, patient history, and epidemiological information. The expected result is Negative.  Fact Sheet for Patients:  EntrepreneurPulse.com.au  Fact Sheet for Healthcare Providers:  IncredibleEmployment.be  This test is no t yet approved or cleared by the [place] FDA and  has been authorized for detection and/or diagnosis of SARS-CoV-2 by FDA under an Emergency Use Authorization (EUA). This EUA will remain  in effect (meaning this test can be used) for the duration of the COVID-19 declaration under Section 564(b)(1) of the Act, 21 U.S.C.section 360bbb-3(b)(1), unless the authorization is terminated  or revoked sooner.       Influenza A by PCR NEGATIVE NEGATIVE Final   Influenza B by PCR NEGATIVE NEGATIVE Final    Comment: (NOTE) The Xpert Xpress SARS-CoV-2/FLU/RSV plus assay is intended as an aid in the diagnosis of influenza from Nasopharyngeal swab specimens and should not be used as a sole basis for treatment. Nasal washings and aspirates are unacceptable for Xpert Xpress SARS-CoV-2/FLU/RSV testing.  Fact Sheet for Patients: EntrepreneurPulse.com.au  Fact Sheet for Healthcare Providers: IncredibleEmployment.be  This test is not yet approved or cleared by the [place] FDA and has been authorized for detection and/or diagnosis of SARS-CoV-2 by FDA under an Emergency Use Authorization (EUA). This EUA will remain in effect (meaning this test can be used) for the  duration of the COVID-19 declaration under Section 564(b)(1) of the Act, 21 U.S.C. section 360bbb-3(b)(1), unless the authorization is terminated or revoked.  Performed at [facility], [address] [place], [place] [address]     Coagulation Studies: No results for input(s): LABPROT, INR in the last 72 hours.  Urinalysis: No results for input(s): COLORURINE, LABSPEC, PHURINE, GLUCOSEU, HGBUR, BILIRUBINUR, KETONESUR, PROTEINUR, UROBILINOGEN, NITRITE, LEUKOCYTESUR in the last 72 hours.  Invalid input(s): APPERANCEUR    Imaging: DG Chest 1 View  Result Date: 12/12/2020 CLINICAL DATA:  Short of breath. EXAM: CHEST  1 VIEW COMPARISON:  09/18/2019 and older exams. FINDINGS: Cardiac silhouette is mildly enlarged. No mediastinal or hilar masses. Airspace consolidation in the left mid lung. Additional hazy airspace opacity is noted in both lung bases. The remainder of the lungs is clear. Lungs are hyperexpanded. Blunted left hemidiaphragm consistent with a small effusion. No pneumothorax. Left arm vascular stent, stable. Skeletal structures are grossly intact. IMPRESSION: 1. Airspace consolidation in the left mid  lung with additional smaller areas of hazy opacity in both lung bases. Suspect pneumonia in the left mid lung. Lung base opacities may reflect additional infiltrate or be due to atelectasis. Probable small left effusion. No convincing pulmonary edema. Electronically Signed   By: Lajean Manes M.D.   On: 12/12/2020 12:03     Medications:   . cefTRIAXone (ROCEPHIN)  IV Stopped (12/13/20 0932)   . amLODipine  10 mg Oral Q0600  . azithromycin  500 mg Oral Daily  . Chlorhexidine Gluconate Cloth  6 each Topical Q0600  . feeding supplement (NEPRO CARB STEADY)  237 mL Oral BID BM  . heparin  5,000 Units Subcutaneous Q8H  . metoprolol tartrate  100 mg Oral BID  . minoxidil  2.5 mg Oral BID  . multivitamin  1 tablet Oral QHS  . pantoprazole  40 mg Oral Daily  . sevelamer  carbonate  2,400 mg Oral TID WC   sevelamer carbonate  Assessment/ Plan:  [name] is a 65 y.o.  male with end stage renal disease on hemodialysis secondary to FSGS, history of kidney transplant, hypertension, Barrett's esophagus, hyperlipidemia who is admitted to [facility] on 12/12/2020 for Sepsis [facility]) [A41.9] Sepsis due to pneumonia ([place]) [J18.9, A41.9]  [facility] TTS [place]. AVF  1. End Stage Renal Disease: on hemodialysis.  Continue TTS schedule.  No indication for HD today  2. Hypertension: patient seems to not be complaint with his medications. Home regimen of amlodipine, minoxidil, and metoprolol.    3. Anemia of chronic kidney disease: hemoglobin 9.7. Macrocytic.  - EPO as outpatient.   4. Secondary Hyperparathyroidism:  - continue sevelamer    5. Pneumonia  - continue treatment per primary team: ceftriaxone and oral azithromycin.    LOS: [place] 5/8/202210:30 AM

## 2020-12-14 NOTE — Discharge Summary (Addendum)
Physician Discharge Summary   Scott Castro  male DOB: 1956-04-12  UDJ:[id number]  PCP: Franciso Bend, MD  Admit date: 12/12/2020 Discharge date: 12/14/2020  Admitted From: home Disposition:  home CODE STATUS: Full code  Discharge Instructions    Discharge instructions   Complete by: As directed    You have received 3 days of IV antibiotic for your pneumonia.  You still had a mild fever yesterday, but since you really want to go home today, I am discharging you with close monitoring (which you will get with dialysis).  Please take 2 more days of oral antibiotic with Levaquin starting tomorrow 12/15/20.   Dr. Enzo Bi [facility] Course:  For full details, please see H&P, progress notes, consult notes and ancillary notes.  Briefly,  Scott Castro a 65 y.o.malewith medical history significant forhypertension, end-stage renal disease on hemodialysis, COPD/asthma secondary to secondhand smoke, presented to the emergency department for chief concerns of shortness of breath, found to have PNA.   Sepsis 2/2 PNA -increased lactic acid 2.2, increased respiration rate, increased heart rate, with source of pneumonia --CXR showed "Airspace consolidation in the left mid lung with additional smaller areas of hazy opacity in both lung bases." -Pro-Cal was positive at 2.21  -COVID was negative -Blood cultures x2 were ordered, patient declines -Status post azithromycin and ceftriaxone IV per EDP --Pt received 3 days of ceftriaxone and azithromycin, and insisted on being discharged.  Pt did still have a mild fever the day prior, however, since pt wanted to leave and said that he gets close monitoring at his regular dialysis sessions, I agreed to discharge pt on 2 more days of Levaquin.    Elevated troponin, likely baseline 2/2 ESRD status -trop 60's-70's, no chest pain. --low clinical suspicion for ACS as patient denies chest pain  ESRD on HD TTS - nephrology has been  consultedfor continuation of hemodialysis via left antecubital AV access -Patient has history of transplant, and the transplanted kidney developed FSGS -patient is compliant with dialysis session and has not missed dialysis in the last week -Patient is anuric --iHD per nephrology  Hypertension -elevated, patient did not take his antihypertensive prior to presentation due to feeling poorly -on home amlodipine 10 mg daily, metoprolol tartrate 100 mg twice daily, minoxidil 2.5 mg p.o. twice daily --cont home regimen  COPD -appears to be compensated at this time it does not appear to be in exacerbation -Per pulmonologynote, patient is not on any inhalers, and doing well   Discharge Diagnoses:  Principal Problem:   Sepsis ([place]) Active Problems:   COPD (chronic obstructive pulmonary disease) ([place])   End stage renal disease on dialysis ([place])   Hyperkalemia   Arteriovenous fistula (H[facility])   FSGS (focal segmental glomerulosclerosis)   Hypertension   Kidney transplanted   Pneumonia   30 Day Unplanned Readmission Risk Score   Flowsheet Row ED to Hosp-Admission (Current) from 12/12/2020 in [place] (1C)  30 Day Unplanned Readmission Risk Score (%) 10.56 Filed at 12/14/2020 1200     This score is the patient's risk of an unplanned readmission within 30 days of being discharged (0 -100%). The score is based on dignosis, age, lab data, medications, orders, and past utilization.   Low:  0-14.9   Medium: 15-21.9   High: 22-29.9   Extreme: 30 and above        Discharge Instructions:  Allergies as of 12/14/2020   No  Known Allergies     Medication List    STOP taking these medications   HYDROcodone-acetaminophen 5-325 MG tablet Commonly known as: NORCO/VICODIN     TAKE these medications   amLODipine 10 MG tablet Commonly known as: NORVASC Take 10 mg by mouth daily at 6 (six) AM.   cinacalcet 60 MG tablet Commonly known as: SENSIPAR Take 60  mg by mouth daily.   cloNIDine 0.3 MG tablet Commonly known as: CATAPRES Take 0.3 mg by mouth 3 (three) times daily.   enalapril 20 MG tablet Commonly known as: VASOTEC Take 20 mg by mouth 2 (two) times daily.   levofloxacin 500 MG tablet Commonly known as: Levaquin Take 1 tablet (500 mg total) by mouth daily for 2 days. Antibiotic for your pneumonia. Start taking on: Dec 15, 2020   meloxicam 15 MG tablet Commonly known as: MOBIC Take 1 tablet (15 mg total) by mouth daily as needed for pain. What changed:   when to take this  reasons to take this   metoprolol tartrate 100 MG tablet Commonly known as: LOPRESSOR Take 100 mg by mouth 2 (two) times daily.   minoxidil 2.5 MG tablet Commonly known as: LONITEN Take by mouth 2 (two) times daily.   pantoprazole 40 MG tablet Commonly known as: PROTONIX Take 40 mg by mouth daily.   pravastatin 20 MG tablet Commonly known as: PRAVACHOL Take 20 mg by mouth daily.   sevelamer carbonate 800 MG tablet Commonly known as: RENVELA Take by mouth See admin instructions. Take 2400 mg by mouth three times daily, 1600 mg with snacks        Follow-up Information    Voora, [name], MD. Schedule an appointment as soon as possible for a visit in 1 week(s).   Specialty: Nephrology Contact information: [place] [address] [phone]               No Known Allergies   The results of significant diagnostics from this hospitalization (including imaging, microbiology, ancillary and laboratory) are listed below for reference.   Consultations:   Procedures/Studies: DG Chest 1 View  Result Date: 12/12/2020 CLINICAL DATA:  Short of breath. EXAM: CHEST  1 VIEW COMPARISON:  09/18/2019 and older exams. FINDINGS: Cardiac silhouette is mildly enlarged. No mediastinal or hilar masses. Airspace consolidation in the left mid lung. Additional hazy airspace opacity is noted in both lung bases. The remainder of the  lungs is clear. Lungs are hyperexpanded. Blunted left hemidiaphragm consistent with a small effusion. No pneumothorax. Left arm vascular stent, stable. Skeletal structures are grossly intact. IMPRESSION: 1. Airspace consolidation in the left mid lung with additional smaller areas of hazy opacity in both lung bases. Suspect pneumonia in the left mid lung. Lung base opacities may reflect additional infiltrate or be due to atelectasis. Probable small left effusion. No convincing pulmonary edema. Electronically Signed   By: Lajean Manes M.D.   On: 12/12/2020 12:03   DG Foot Complete Right  Result Date: 12/11/2020 Please see detailed radiograph report in office note.     Labs: BNP (last 3 results) No results for input(s): BNP in the last 8760 hours. Basic Metabolic Panel: Recent Labs  Lab 12/12/20 1132 12/13/20 0412 12/14/20 0443  NA 140 136 139  K 4.3 4.8 4.3  CL 99 99 100  CO2 27 26 27   GLUCOSE 83 92 94  BUN 29* 43* 31*  CREATININE 6.97* 8.91* 6.55*  CALCIUM 9.5 9.4 9.6  MG  --  2.0 1.9   Liver Function Tests: Recent Labs  Lab 12/12/20 1132  AST 11*  ALT 8  ALKPHOS 97  BILITOT 0.7  PROT 7.7  ALBUMIN 3.8   No results for input(s): LIPASE, AMYLASE in the last 168 hours. No results for input(s): AMMONIA in the last 168 hours. CBC: Recent Labs  Lab 12/12/20 1132 12/13/20 0412 12/14/20 0443  WBC 9.5 9.9 6.9  NEUTROABS 8.2*  --   --   HGB 11.1* 9.7* 9.9*  HCT 34.5* 29.0* 29.5*  MCV 102.4* 101.8* 100.7*  PLT 192 204 171   Cardiac Enzymes: No results for input(s): CKTOTAL, CKMB, CKMBINDEX, TROPONINI in the last 168 hours. BNP: Invalid input(s): POCBNP CBG: Recent Labs  Lab 12/13/20 1604  GLUCAP 123*   D-Dimer No results for input(s): DDIMER in the last 72 hours. Hgb A1c No results for input(s): HGBA1C in the last 72 hours. Lipid Profile No results for input(s): CHOL, HDL, LDLCALC, TRIG, CHOLHDL, LDLDIRECT in the last 72 hours. Thyroid function studies No  results for input(s): TSH, T4TOTAL, T3FREE, THYROIDAB in the last 72 hours.  Invalid input(s): FREET3 Anemia work up No results for input(s): VITAMINB12, FOLATE, FERRITIN, TIBC, IRON, RETICCTPCT in the last 72 hours. Urinalysis No results found for: COLORURINE, APPEARANCEUR, [place], [place], [place], [place], [place], [place], PROTEINUR, UROBILINOGEN, NITRITE, LEUKOCYTESUR Sepsis Labs Invalid input(s): PROCALCITONIN,  WBC,  LACTICIDVEN Microbiology Recent Results (from the past 240 hour(s))  Blood culture (routine x 2)     Status: None (Preliminary result)   Collection Time: 12/12/20 11:32 AM   Specimen: BLOOD  Result Value Ref Range Status   Specimen Description BLOOD  RAC  Final   Special Requests   Final    BOTTLES DRAWN AEROBIC AND ANAEROBIC Blood Culture results may not be optimal due to an inadequate volume of blood received in culture bottles   Culture   Final    NO GROWTH 2 DAYS Performed at [facility], [address] [place], [place] [address]    Report Status PENDING  Incomplete  Blood culture (routine x 2)     Status: None (Preliminary result)   Collection Time: 12/12/20 11:32 AM   Specimen: BLOOD  Result Value Ref Range Status   Specimen Description BLOOD RHD  Final   Special Requests   Final    BOTTLES DRAWN AEROBIC AND ANAEROBIC Blood Culture results may not be optimal due to an inadequate volume of blood received in culture bottles   Culture   Final    NO GROWTH 2 DAYS Performed at [facility], [address] [place], [place] [address]    Report Status PENDING  Incomplete  Resp Panel by RT-PCR (Flu A&B, Covid) Nasopharyngeal Swab     Status: None   Collection Time: 12/12/20 11:32 AM   Specimen: Nasopharyngeal Swab; Nasopharyngeal(NP) swabs in vial transport medium  Result Value Ref Range Status   SARS Coronavirus 2 by RT PCR NEGATIVE NEGATIVE Final    Comment: (NOTE) SARS-CoV-2 target nucleic acids are NOT DETECTED.  The  SARS-CoV-2 RNA is generally detectable in upper respiratory specimens during the acute phase of infection. The lowest concentration of SARS-CoV-2 viral copies this assay can detect is 138 copies/mL. A negative result does not preclude SARS-Cov-2 infection and should not be used as the sole basis for treatment or other patient management decisions. A negative result may occur with  improper specimen collection/handling, submission of specimen other than nasopharyngeal swab, presence of viral mutation(s) within the areas targeted  by this assay, and inadequate number of viral copies(<138 copies/mL). A negative result must be combined with clinical observations, patient history, and epidemiological information. The expected result is Negative.  Fact Sheet for Patients:  EntrepreneurPulse.com.au  Fact Sheet for Healthcare Providers:  IncredibleEmployment.be  This test is no t yet approved or cleared by the [place] FDA and  has been authorized for detection and/or diagnosis of SARS-CoV-2 by FDA under an Emergency Use Authorization (EUA). This EUA will remain  in effect (meaning this test can be used) for the duration of the COVID-19 declaration under Section 564(b)(1) of the Act, 21 U.S.C.section 360bbb-3(b)(1), unless the authorization is terminated  or revoked sooner.       Influenza A by PCR NEGATIVE NEGATIVE Final   Influenza B by PCR NEGATIVE NEGATIVE Final    Comment: (NOTE) The Xpert Xpress SARS-CoV-2/FLU/RSV plus assay is intended as an aid in the diagnosis of influenza from Nasopharyngeal swab specimens and should not be used as a sole basis for treatment. Nasal washings and aspirates are unacceptable for Xpert Xpress SARS-CoV-2/FLU/RSV testing.  Fact Sheet for Patients: EntrepreneurPulse.com.au  Fact Sheet for Healthcare Providers: IncredibleEmployment.be  This test is not yet approved or  cleared by the [place] FDA and has been authorized for detection and/or diagnosis of SARS-CoV-2 by FDA under an Emergency Use Authorization (EUA). This EUA will remain in effect (meaning this test can be used) for the duration of the COVID-19 declaration under Section 564(b)(1) of the Act, 21 U.S.C. section 360bbb-3(b)(1), unless the authorization is terminated or revoked.  Performed at [facility], [address] [place], [place] [address]      Total time spend on discharging this patient, including the last patient exam, discussing the hospital stay, instructions for ongoing care as it relates to all pertinent caregivers, as well as preparing the medical discharge records, prescriptions, and/or referrals as applicable, is 40 minutes.    Enzo Bi, MD  [facility] 12/14/2020, 1:47 PM

## 2020-12-14 NOTE — Plan of Care (Signed)
  Problem: Education: Goal: Knowledge of General Education information will improve Description: Including pain rating scale, medication(s)/side effects and non-pharmacologic comfort measures Outcome: Adequate for Discharge   Problem: Health Behavior/Discharge Planning: Goal: Ability to manage health-related needs will improve Outcome: Adequate for Discharge   Problem: Clinical Measurements: Goal: Ability to maintain clinical measurements within normal limits will improve Outcome: Adequate for Discharge Goal: Will remain free from infection Outcome: Adequate for Discharge Goal: Diagnostic test results will improve Outcome: Adequate for Discharge Goal: Respiratory complications will improve Outcome: Adequate for Discharge Goal: Cardiovascular complication will be avoided Outcome: Adequate for Discharge   Problem: Nutrition: Goal: Adequate nutrition will be maintained Outcome: Adequate for Discharge   Problem: Coping: Goal: Level of anxiety will decrease Outcome: Adequate for Discharge   Problem: Elimination: Goal: Will not experience complications related to bowel motility Outcome: Adequate for Discharge Goal: Will not experience complications related to urinary retention Outcome: Adequate for Discharge

## 2020-12-17 LAB — CULTURE, BLOOD (ROUTINE X 2)
Culture: NO GROWTH
Culture: NO GROWTH

## 2020-12-19 DIAGNOSIS — M79676 Pain in unspecified toe(s): Secondary | ICD-10-CM

## 2020-12-23 DIAGNOSIS — M79676 Pain in unspecified toe(s): Secondary | ICD-10-CM

## 2020-12-23 NOTE — Progress Notes (Signed)
   HPI: 65 y.o. male presenting today as a new patient for evaluation of pain and sensitivity to the right foot that occurred approximately 5 days ago, 11/30/2020.  Patient states that he stubbed his right fifth toe on some exercise equipment at home.  He has had pain and tenderness ever since.  He has been trying to reduce his activity and rest the foot with minimal improvement.  He presents for further treatment and evaluation  Past Medical History:  Diagnosis Date  . Barrett's esophagus   . Chronic kidney disease    CHRONIC\  . Glomerulosclerosis, focal   . Hyperparathyroidism due to renal insufficiency [facility])      Physical Exam: General: The patient is alert and oriented x3 in no acute distress.  Dermatology: Skin is warm, dry and supple bilateral lower extremities. Negative for open lesions or macerations.  Vascular: Palpable pedal pulses bilaterally.  There is some edema with ecchymosis noted to the area of the injury.  Capillary refill within normal limits.  Neurological: Epicritic and protective threshold grossly intact bilaterally.   Musculoskeletal Exam: Range of motion within normal limits to all pedal and ankle joints bilateral. Muscle strength 5/5 in all groups bilateral.  Pain on palpation around the fifth ray right foot  Radiographic Exam:  Normal osseous mineralization.  There is a subtle impacted fracture of the right fifth metatarsal head/neck.  Extra-articular.  Nondisplaced.  Closed.  Assessment: 1.  Fracture fifth metatarsal right, closed, nondisplaced, initial encounter   Plan of Care:  1. Patient evaluated. X-Rays reviewed.  2.  Cam boot dispensed.  Weightbearing as tolerated 3.  Prescription for meloxicam 15 mg daily 4.  Prescription for Vicodin 5/325 mg 5.  Return to clinic in 4 weeks for follow-up x-ray  *Works at [employer] for 34 years.  Going to take short-term disability.      Edrick Kins, DPM [facility]  Dr. Edrick Kins, DPM     [address], [place] [address]                Office [phone]  Fax [phone]

## 2020-12-31 DIAGNOSIS — M79676 Pain in unspecified toe(s): Secondary | ICD-10-CM

## 2021-01-02 ENCOUNTER — Ambulatory Visit (INDEPENDENT_AMBULATORY_CARE_PROVIDER_SITE_OTHER): Payer: Medicare Other

## 2021-01-02 ENCOUNTER — Encounter: Payer: Self-pay | Admitting: *Deleted

## 2021-01-02 ENCOUNTER — Other Ambulatory Visit: Payer: Self-pay

## 2021-01-02 ENCOUNTER — Encounter: Payer: Self-pay | Admitting: Podiatry

## 2021-01-02 ENCOUNTER — Ambulatory Visit (INDEPENDENT_AMBULATORY_CARE_PROVIDER_SITE_OTHER): Payer: Medicare Other | Admitting: Podiatry

## 2021-01-02 DIAGNOSIS — S92354A Nondisplaced fracture of fifth metatarsal bone, right foot, initial encounter for closed fracture: Secondary | ICD-10-CM

## 2021-01-02 NOTE — Progress Notes (Signed)
   HPI: 65 y.o. male presenting today for follow-up evaluation of a fifth metatarsal fracture to the right foot.  Overall the patient states that he is doing much better.  He is no longer wearing the cam boot.  He is ready to return to work.  No new complaints at this time  Past Medical History:  Diagnosis Date  . Barrett's esophagus   . Chronic kidney disease    CHRONIC\  . Glomerulosclerosis, focal   . Hyperparathyroidism due to renal insufficiency [facility])      Physical Exam: General: The patient is alert and oriented x3 in no acute distress.  Dermatology: Skin is warm, dry and supple bilateral lower extremities. Negative for open lesions or macerations.  Vascular: Palpable pedal pulses bilaterally.  There is some edema with ecchymosis noted to the area of the injury.  Capillary refill within normal limits.  Neurological: Epicritic and protective threshold grossly intact bilaterally.   Musculoskeletal Exam: Range of motion within normal limits to all pedal and ankle joints bilateral. Muscle strength 5/5 in all groups bilateral.  Negative for any significant pain on palpation around the fifth ray right foot  Radiographic Exam:  Normal osseous mineralization.  There is a subtle impacted fracture of the right fifth metatarsal head/neck.  Extra-articular.  Nondisplaced.  Closed.  No significant change since last x-rays taken.  It appears that the area is healing routinely  Assessment: 1.  Fracture fifth metatarsal right, closed, nondisplaced, subsequent encounter with routine healing   Plan of Care:  1. Patient evaluated. X-Rays reviewed.  2.  Patient may now discontinue cam boot.  Recommend good supportive shoes and sneakers 3.  Patient states that he is ready to go back to work.  Patient may return to work 01/06/2021 full activity no restrictions.  Note for work was provided 4.  Return to clinic as needed  *Works at [employer] for 34 years.  Going to take short-term disability.       Edrick Kins, DPM [facility]  Dr. Edrick Kins, DPM    [address], [place] [address]                Office [phone]  Fax [phone]

## 2021-05-19 ENCOUNTER — Ambulatory Visit (INDEPENDENT_AMBULATORY_CARE_PROVIDER_SITE_OTHER): Payer: Medicare Other | Admitting: Podiatry

## 2021-05-19 ENCOUNTER — Other Ambulatory Visit: Payer: Self-pay

## 2021-05-19 DIAGNOSIS — M722 Plantar fascial fibromatosis: Secondary | ICD-10-CM

## 2021-05-19 DIAGNOSIS — M7751 Other enthesopathy of right foot: Secondary | ICD-10-CM | POA: Diagnosis not present

## 2021-05-19 MED ORDER — MELOXICAM 15 MG PO TABS: 15.0000 mg | ORAL_TABLET | Freq: Every day | ORAL | 1 refills | Status: DC | PRN

## 2021-05-19 MED ORDER — BETAMETHASONE SOD PHOS & ACET 6 (3-3) MG/ML IJ SUSP: 3.0000 mg | Freq: Once | INTRAMUSCULAR | Status: DC

## 2021-05-19 NOTE — Progress Notes (Signed)
   Subjective: 65 y.o. male presenting today for evaluation of right heel pain that is been going on for few months now.  He also has been experiencing some right great toe pain.  He denies a history of injury.  Currently he is not anything for treatment.  He presents for further treatment evaluation   Past Medical History:  Diagnosis Date   Barrett's esophagus    Chronic kidney disease    CHRONIC\   Glomerulosclerosis, focal    Hyperparathyroidism due to renal insufficiency (HCC)      Objective: Physical Exam General: The patient is alert and oriented x3 in no acute distress.  Dermatology: Skin is warm, dry and supple bilateral lower extremities. Negative for open lesions or macerations bilateral.   Vascular: Skin is warm to touch.  Venous reflux and red discoloration/venous pooling noted to the right foot  Neurological: Epicritic and protective threshold intact bilateral.   Musculoskeletal: Tenderness to palpation to the plantar aspect of the right heel along the plantar fascia. All other joints range of motion within normal limits bilateral. Strength 5/5 in all groups bilateral.  There is also pain on palpation to the right great toe  Radiographic exam: Normal osseous mineralization. Joint spaces preserved. No fracture/dislocation/boney destruction. No other soft tissue abnormalities or radiopaque foreign bodies.   Assessment: 1. Plantar fasciitis right 2.  Capsulitis right great toe  Plan of Care:  1. Patient evaluated. Xrays reviewed.   2. Injection of 0.5cc Celestone soluspan injected into the right plantar fascia  3. Rx for Meloxicam ordered for patient. 4.  Continue wearing good supportive shoes and sneakers.  Patient is currently wearing Orthoheel shoes 5.  Return to clinic in 6 weeks  *Works at [employer] for [place] years   Edrick Kins, DPM [facility]  Dr. Edrick Kins, DPM    [address],  [place] [address]                Office [phone]  Fax [phone]

## 2021-05-25 ENCOUNTER — Telehealth: Payer: Self-pay | Admitting: *Deleted

## 2021-05-25 NOTE — Telephone Encounter (Signed)
"  I was there last Tuesday to see Dr. Amalia Hailey.  He was supposed to have sent a prescription to [facility].  He didn't do it.  Can you have him to fill out the prescription?"

## 2021-05-26 NOTE — Telephone Encounter (Signed)
Spoke with the patient on the phone.  It was actually Voltaren gel.  I clarified this with the patient and he is going to swing by the pharmacy to pick some up.  Thanks, Dr. Amalia Hailey

## 2021-05-29 ENCOUNTER — Other Ambulatory Visit: Payer: Self-pay | Admitting: Specialist

## 2021-05-29 DIAGNOSIS — R918 Other nonspecific abnormal finding of lung field: Secondary | ICD-10-CM

## 2021-05-29 DIAGNOSIS — R0609 Other forms of dyspnea: Secondary | ICD-10-CM

## 2021-05-29 DIAGNOSIS — R059 Cough, unspecified: Secondary | ICD-10-CM

## 2021-06-30 ENCOUNTER — Encounter: Payer: Self-pay | Admitting: Podiatry

## 2021-06-30 ENCOUNTER — Telehealth: Payer: Self-pay | Admitting: Podiatry

## 2021-06-30 ENCOUNTER — Other Ambulatory Visit: Payer: Self-pay

## 2021-06-30 ENCOUNTER — Ambulatory Visit (INDEPENDENT_AMBULATORY_CARE_PROVIDER_SITE_OTHER): Payer: Medicare Other | Admitting: Podiatry

## 2021-06-30 DIAGNOSIS — I739 Peripheral vascular disease, unspecified: Secondary | ICD-10-CM | POA: Diagnosis not present

## 2021-06-30 DIAGNOSIS — I70221 Atherosclerosis of native arteries of extremities with rest pain, right leg: Secondary | ICD-10-CM

## 2021-06-30 NOTE — Telephone Encounter (Signed)
Pt call and ask did you put the circulation test in yet for him he has not got a call yet

## 2021-06-30 NOTE — Progress Notes (Signed)
   HPI: 65 y.o. male presenting today for follow-up evaluation of severe right foot pain that is only increased over the past week.  Patient states is very painful.  It feels much better when it is in a dependent position.  When he elevates his foot he experiences increased pain.  He denies a history of injury.  He presents for further treatment evaluation  Past Medical History:  Diagnosis Date   Barrett's esophagus    Chronic kidney disease    CHRONIC\   Glomerulosclerosis, focal    Hyperparathyroidism due to renal insufficiency [facility])      Physical Exam: General: The patient is alert and oriented x3 in no acute distress.  Dermatology: No open wounds.  There is some small superficial eschar to the distal tip of the right great toe and second toe.  Thickening with discoloration noted to the nails 1-5 bilateral  Vascular: Today the skin is cold to touch.  I am unable to palpate pulses.  He does not have a history of ischemia but I am concerned for ischemia to the lower extremity   Neurological: Epicritic and protective threshold grossly intact bilaterally.   Musculoskeletal Exam: No pedal deformity noted  Radiographic exam 01/02/2021 RT foot: Unfortunately the patient left today and we are unable to obtain new x-rays.  X-rays taken 01/02/2021 demonstrate arthrosclerosis with calcification of the distal arteries.  Assessment: 1.  Critical limb ischemia right forefoot   Plan of Care:  1. Patient evaluated. X-Rays reviewed that were taken 01/02/2021.  2.  Order placed for stat ABI with TBI bilateral lower extremities 3.  Explained to the patient that I believe this is a circulation issue.  I believe that he is having ischemia and ischemic pain to the foot 4.  Return to clinic after ABIs.  If ABIs are abnormal he will need vascular consult      Edrick Kins, DPM [facility]  Dr. Edrick Kins, DPM    [address], [place] [address]                Office [phone]  Fax [phone]

## 2021-06-30 NOTE — Telephone Encounter (Signed)
Yes, I placed a stat order on it. Could someone please follow up on this? Thanks, Dr. Amalia Hailey

## 2021-07-01 ENCOUNTER — Encounter: Payer: Self-pay | Admitting: Emergency Medicine

## 2021-07-01 ENCOUNTER — Emergency Department
Admission: EM | Admit: 2021-07-01 | Discharge: 2021-07-01 | Disposition: A | Discharge status: Home / Self Care | Payer: Medicare Other | Source: Home / Self Care

## 2021-07-01 ENCOUNTER — Other Ambulatory Visit: Payer: Self-pay

## 2021-07-01 ENCOUNTER — Ambulatory Visit: Admission: RE | Admit: 2021-07-01 | Payer: Medicare Other | Source: Ambulatory Visit

## 2021-07-01 DIAGNOSIS — Z5321 Procedure and treatment not carried out due to patient leaving prior to being seen by health care provider: Secondary | ICD-10-CM | POA: Insufficient documentation

## 2021-07-01 DIAGNOSIS — A4189 Other specified sepsis: Secondary | ICD-10-CM | POA: Diagnosis not present

## 2021-07-01 DIAGNOSIS — R0602 Shortness of breath: Secondary | ICD-10-CM | POA: Insufficient documentation

## 2021-07-01 DIAGNOSIS — R112 Nausea with vomiting, unspecified: Secondary | ICD-10-CM | POA: Insufficient documentation

## 2021-07-01 DIAGNOSIS — R059 Cough, unspecified: Secondary | ICD-10-CM | POA: Insufficient documentation

## 2021-07-01 DIAGNOSIS — R531 Weakness: Secondary | ICD-10-CM | POA: Insufficient documentation

## 2021-07-01 DIAGNOSIS — R0902 Hypoxemia: Secondary | ICD-10-CM | POA: Diagnosis not present

## 2021-07-01 NOTE — ED Notes (Signed)
Pt called x's 3, no response. Pt called on the phone, pt reported he left a little after 5 and told someone at the front desk he was leaving. Pt reports will keep follow up and scan appointment as scheduled and will return here for worsening sx's.

## 2021-07-01 NOTE — ED Triage Notes (Signed)
Pt reports persistent cough for awhile, NV and weakness. Pt reports his MD advised him to come to the ED for a X-ray. Pt scheduled for CT scan next week per Dr. Vella Kohler. Pt reports feels a little SOB right now

## 2021-07-01 NOTE — ED Triage Notes (Signed)
Pt called, no response

## 2021-07-01 NOTE — ED Triage Notes (Signed)
Pt called no response

## 2021-07-03 ENCOUNTER — Encounter: Payer: Self-pay | Admitting: Emergency Medicine

## 2021-07-03 ENCOUNTER — Inpatient Hospital Stay
Admission: EM | Admit: 2021-07-03 | Discharge: 2021-07-08 | DRG: 871 | Disposition: A | Discharge status: Home / Self Care | Payer: Medicare Other | Attending: Hospitalist | Admitting: Hospitalist

## 2021-07-03 ENCOUNTER — Other Ambulatory Visit: Payer: Self-pay

## 2021-07-03 ENCOUNTER — Emergency Department: Payer: Medicare Other

## 2021-07-03 DIAGNOSIS — N186 End stage renal disease: Secondary | ICD-10-CM

## 2021-07-03 DIAGNOSIS — A4189 Other specified sepsis: Secondary | ICD-10-CM | POA: Diagnosis present

## 2021-07-03 DIAGNOSIS — D649 Anemia, unspecified: Secondary | ICD-10-CM

## 2021-07-03 DIAGNOSIS — J1282 Pneumonia due to coronavirus disease 2019: Secondary | ICD-10-CM | POA: Diagnosis present

## 2021-07-03 DIAGNOSIS — Z7722 Contact with and (suspected) exposure to environmental tobacco smoke (acute) (chronic): Secondary | ICD-10-CM | POA: Diagnosis present

## 2021-07-03 DIAGNOSIS — Z6822 Body mass index (BMI) 22.0-22.9, adult: Secondary | ICD-10-CM | POA: Diagnosis not present

## 2021-07-03 DIAGNOSIS — Z940 Kidney transplant status: Secondary | ICD-10-CM | POA: Diagnosis not present

## 2021-07-03 DIAGNOSIS — M7989 Other specified soft tissue disorders: Secondary | ICD-10-CM

## 2021-07-03 DIAGNOSIS — E782 Mixed hyperlipidemia: Secondary | ICD-10-CM | POA: Diagnosis present

## 2021-07-03 DIAGNOSIS — U071 COVID-19: Secondary | ICD-10-CM | POA: Diagnosis present

## 2021-07-03 DIAGNOSIS — J9601 Acute respiratory failure with hypoxia: Secondary | ICD-10-CM | POA: Diagnosis present

## 2021-07-03 DIAGNOSIS — I10 Essential (primary) hypertension: Secondary | ICD-10-CM

## 2021-07-03 DIAGNOSIS — J449 Chronic obstructive pulmonary disease, unspecified: Secondary | ICD-10-CM | POA: Diagnosis present

## 2021-07-03 DIAGNOSIS — E8809 Other disorders of plasma-protein metabolism, not elsewhere classified: Secondary | ICD-10-CM | POA: Diagnosis present

## 2021-07-03 DIAGNOSIS — Z992 Dependence on renal dialysis: Secondary | ICD-10-CM

## 2021-07-03 DIAGNOSIS — I739 Peripheral vascular disease, unspecified: Secondary | ICD-10-CM | POA: Diagnosis not present

## 2021-07-03 DIAGNOSIS — E441 Mild protein-calorie malnutrition: Secondary | ICD-10-CM | POA: Diagnosis present

## 2021-07-03 DIAGNOSIS — J440 Chronic obstructive pulmonary disease with acute lower respiratory infection: Secondary | ICD-10-CM | POA: Diagnosis present

## 2021-07-03 DIAGNOSIS — J9611 Chronic respiratory failure with hypoxia: Secondary | ICD-10-CM | POA: Diagnosis present

## 2021-07-03 DIAGNOSIS — E46 Unspecified protein-calorie malnutrition: Secondary | ICD-10-CM

## 2021-07-03 DIAGNOSIS — E875 Hyperkalemia: Secondary | ICD-10-CM | POA: Diagnosis not present

## 2021-07-03 DIAGNOSIS — R0902 Hypoxemia: Secondary | ICD-10-CM

## 2021-07-03 DIAGNOSIS — D631 Anemia in chronic kidney disease: Secondary | ICD-10-CM | POA: Diagnosis present

## 2021-07-03 DIAGNOSIS — K219 Gastro-esophageal reflux disease without esophagitis: Secondary | ICD-10-CM | POA: Diagnosis present

## 2021-07-03 DIAGNOSIS — R509 Fever, unspecified: Secondary | ICD-10-CM

## 2021-07-03 DIAGNOSIS — I120 Hypertensive chronic kidney disease with stage 5 chronic kidney disease or end stage renal disease: Secondary | ICD-10-CM | POA: Diagnosis present

## 2021-07-03 DIAGNOSIS — Z79899 Other long term (current) drug therapy: Secondary | ICD-10-CM | POA: Diagnosis not present

## 2021-07-03 DIAGNOSIS — K2270 Barrett's esophagus without dysplasia: Secondary | ICD-10-CM | POA: Diagnosis present

## 2021-07-03 DIAGNOSIS — J189 Pneumonia, unspecified organism: Secondary | ICD-10-CM | POA: Diagnosis not present

## 2021-07-03 DIAGNOSIS — D539 Nutritional anemia, unspecified: Secondary | ICD-10-CM | POA: Diagnosis present

## 2021-07-03 LAB — CBC WITH DIFFERENTIAL/PLATELET
Abs Immature Granulocytes: 0.04 10*3/uL (ref 0.00–0.07)
Basophils Absolute: 0 10*3/uL (ref 0.0–0.1)
Basophils Relative: 0 %
Eosinophils Absolute: 0 10*3/uL (ref 0.0–0.5)
Eosinophils Relative: 0 %
HCT: 35.3 % — ABNORMAL LOW (ref 39.0–52.0)
Hemoglobin: 10.9 g/dL — ABNORMAL LOW (ref 13.0–17.0)
Immature Granulocytes: 0 %
Lymphocytes Relative: 3 %
Lymphs Abs: 0.3 10*3/uL — ABNORMAL LOW (ref 0.7–4.0)
MCH: 31.1 pg (ref 26.0–34.0)
MCHC: 30.9 g/dL (ref 30.0–36.0)
MCV: 100.9 fL — ABNORMAL HIGH (ref 80.0–100.0)
Monocytes Absolute: 0.9 10*3/uL (ref 0.1–1.0)
Monocytes Relative: 9 %
Neutro Abs: 8.1 10*3/uL — ABNORMAL HIGH (ref 1.7–7.7)
Neutrophils Relative %: 88 %
Platelets: 202 10*3/uL (ref 150–400)
RBC: 3.5 MIL/uL — ABNORMAL LOW (ref 4.22–5.81)
RDW: 14.4 % (ref 11.5–15.5)
WBC: 9.4 10*3/uL (ref 4.0–10.5)
nRBC: 0 % (ref 0.0–0.2)

## 2021-07-03 LAB — COMPREHENSIVE METABOLIC PANEL
ALT: 12 U/L (ref 0–44)
AST: 15 U/L (ref 15–41)
Albumin: 3.4 g/dL — ABNORMAL LOW (ref 3.5–5.0)
Alkaline Phosphatase: 81 U/L (ref 38–126)
Anion gap: 13 (ref 5–15)
BUN: 49 mg/dL — ABNORMAL HIGH (ref 8–23)
CO2: 27 mmol/L (ref 22–32)
Calcium: 8.3 mg/dL — ABNORMAL LOW (ref 8.9–10.3)
Chloride: 100 mmol/L (ref 98–111)
Creatinine, Ser: 9.56 mg/dL — ABNORMAL HIGH (ref 0.61–1.24)
GFR, Estimated: 6 mL/min — ABNORMAL LOW (ref >60)
Glucose, Bld: 115 mg/dL — ABNORMAL HIGH (ref 70–99)
Potassium: 5 mmol/L (ref 3.5–5.1)
Sodium: 140 mmol/L (ref 135–145)
Total Bilirubin: 1.2 mg/dL (ref 0.3–1.2)
Total Protein: 7.4 g/dL (ref 6.5–8.1)

## 2021-07-03 LAB — PROTIME-INR
INR: 1.2 (ref 0.8–1.2)
Prothrombin Time: 15 seconds (ref 11.4–15.2)

## 2021-07-03 LAB — PROCALCITONIN: Procalcitonin: 2 ng/mL

## 2021-07-03 LAB — RESP PANEL BY RT-PCR (FLU A&B, COVID) ARPGX2
Influenza A by PCR: NEGATIVE
Influenza B by PCR: NEGATIVE
SARS Coronavirus 2 by RT PCR: POSITIVE — AB

## 2021-07-03 LAB — LACTIC ACID, PLASMA: Lactic Acid, Venous: 1.7 mmol/L (ref 0.5–1.9)

## 2021-07-03 IMAGING — DX DG CHEST 1V PORT
1 series · 1 of 1 positions shown · non-contrast
Comparison: [DATE]

CLINICAL DATA: Question sepsis.

EXAM:
PORTABLE CHEST 1 VIEW

[chest ap]
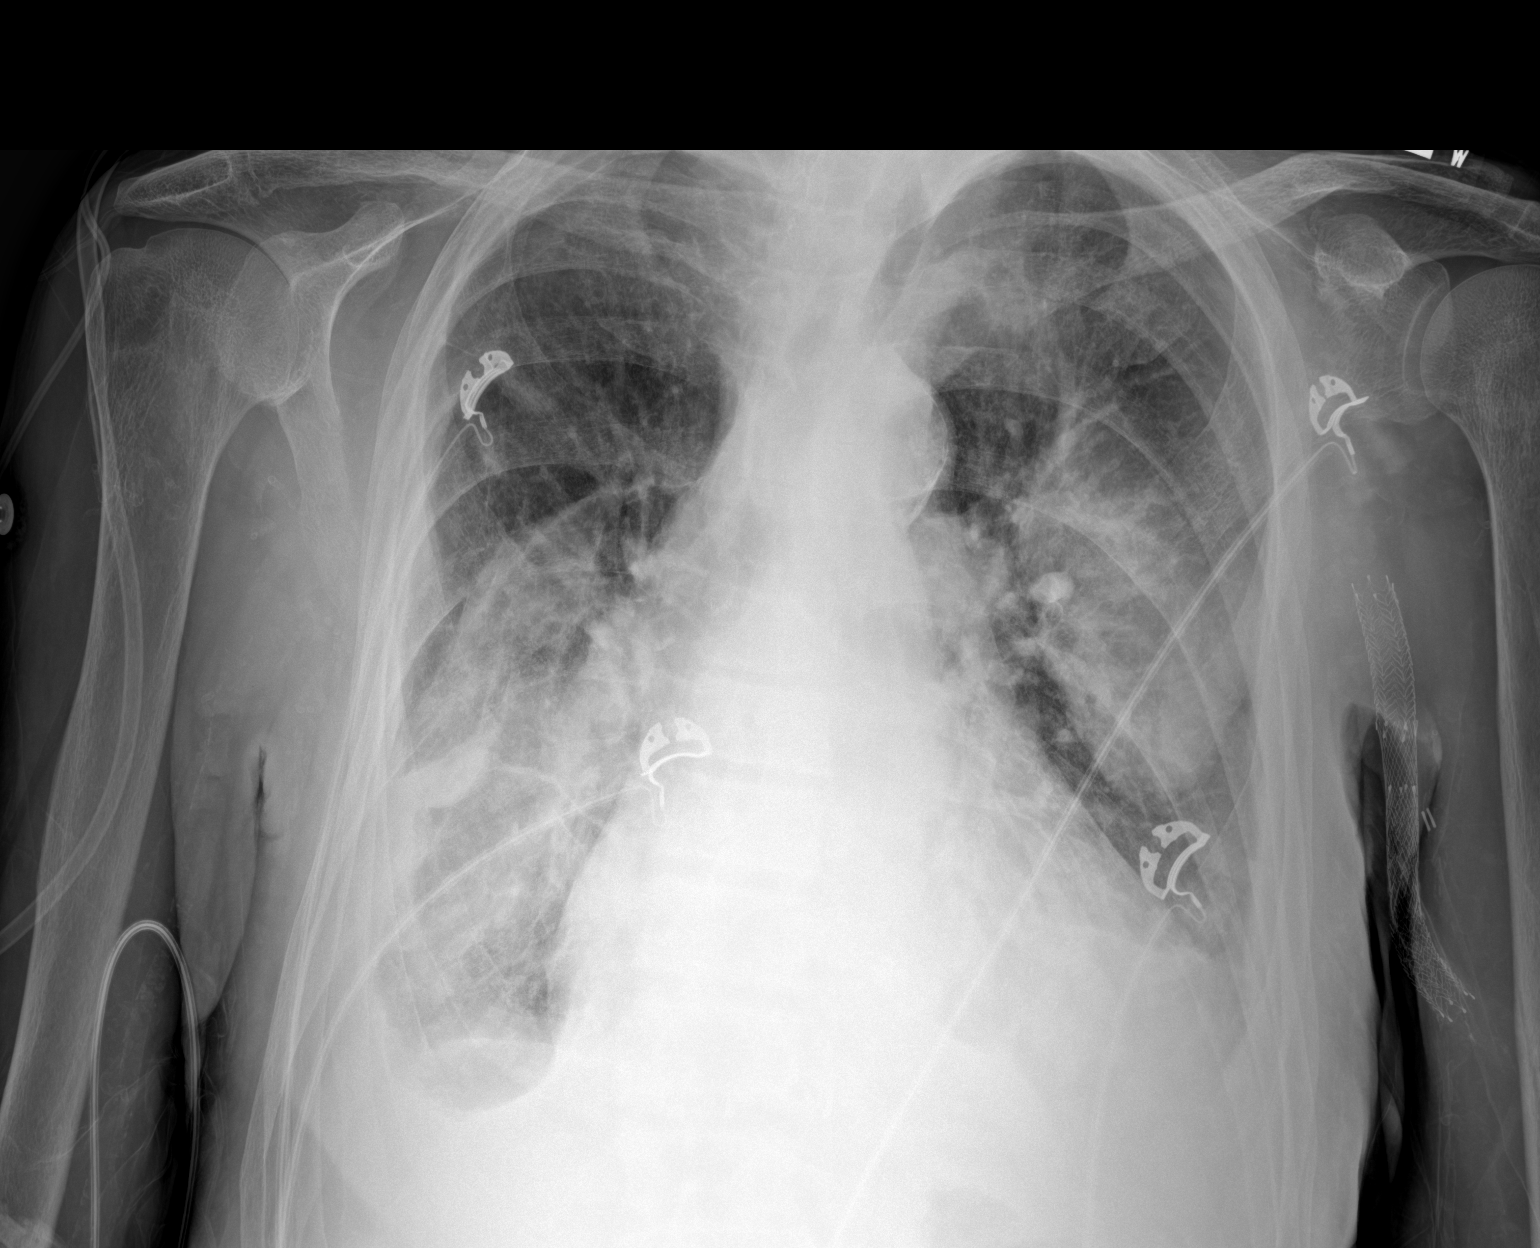

[1 of 1 positions shown; findings below may reference images not displayed]

FINDINGS: Ill-defined airspace density in the left mid lung with mild
progression. Progressive left lower lobe consolidation since the
prior study. Small left effusion

Progressive right lower lobe consolidation and right effusion.

Heart size upper normal.  No definite heart failure.

Stent in the left upper arm.
IMPRESSION: Progressive bilateral airspace disease and bilateral effusions. This
may be due to pneumonia. Heart failure considered less likely.
Underlying mass lesion not excluded. Given the history of possible
sepsis, this is likely pneumonia. Follow-up chest CT recommended if
these areas do not show clearing.

## 2021-07-03 MED ORDER — HYDROCOD POLST-CPM POLST ER 10-8 MG/5ML PO SUER: 5.0000 mL | Freq: Two times a day (BID) | ORAL | Status: DC | PRN

## 2021-07-03 MED ORDER — SODIUM CHLORIDE 0.9 % IV SOLN
100.0000 mg | Freq: Every day | INTRAVENOUS | Status: AC
  Administered 2021-07-05 – 2021-07-07 (×4): 100 mg via INTRAVENOUS
  Filled 2021-07-03 (×3): qty 20
  Filled 2021-07-03 (×3): qty 100

## 2021-07-03 MED ORDER — LACTATED RINGERS IV BOLUS (SEPSIS): 250.0000 mL | Freq: Once | INTRAVENOUS | Status: DC

## 2021-07-03 MED ORDER — DM-GUAIFENESIN ER 30-600 MG PO TB12
1.0000 | ORAL_TABLET | Freq: Two times a day (BID) | ORAL | Status: DC
  Administered 2021-07-04 – 2021-07-08 (×9): 1 via ORAL
  Filled 2021-07-03 (×10): qty 1

## 2021-07-03 MED ORDER — ASCORBIC ACID 500 MG PO TABS: 500.0000 mg | ORAL_TABLET | Freq: Every day | ORAL | Status: DC

## 2021-07-03 MED ORDER — ACETAMINOPHEN 325 MG PO TABS
650.0000 mg | ORAL_TABLET | Freq: Once | ORAL | Status: DC
  Filled 2021-07-03: qty 2

## 2021-07-03 MED ORDER — SODIUM CHLORIDE 0.9 % IV SOLN
200.0000 mg | Freq: Once | INTRAVENOUS | Status: AC
  Administered 2021-07-03: 200 mg via INTRAVENOUS
  Filled 2021-07-03: qty 40

## 2021-07-03 MED ORDER — IPRATROPIUM-ALBUTEROL 0.5-2.5 (3) MG/3ML IN SOLN: 3.0000 mL | RESPIRATORY_TRACT | Status: DC | PRN

## 2021-07-03 MED ORDER — SODIUM CHLORIDE 0.9 % IV SOLN
2.0000 g | INTRAVENOUS | Status: DC
  Administered 2021-07-03: 2 g via INTRAVENOUS
  Filled 2021-07-03 (×3): qty 20

## 2021-07-03 MED ORDER — GUAIFENESIN-DM 100-10 MG/5ML PO SYRP: 5.0000 mL | ORAL_SOLUTION | ORAL | Status: DC | PRN

## 2021-07-03 MED ORDER — DEXAMETHASONE SODIUM PHOSPHATE 10 MG/ML IJ SOLN
6.0000 mg | Freq: Once | INTRAMUSCULAR | Status: AC
  Administered 2021-07-03: 6 mg via INTRAVENOUS
  Filled 2021-07-03: qty 1

## 2021-07-03 MED ORDER — ENSURE ENLIVE PO LIQD: 237.0000 mL | Freq: Two times a day (BID) | ORAL | Status: DC

## 2021-07-03 MED ORDER — SODIUM CHLORIDE 0.9 % IV SOLN
500.0000 mg | INTRAVENOUS | Status: DC
  Administered 2021-07-03: 500 mg via INTRAVENOUS
  Filled 2021-07-03 (×3): qty 500

## 2021-07-03 MED ORDER — ZINC SULFATE 220 (50 ZN) MG PO CAPS
220.0000 mg | ORAL_CAPSULE | Freq: Every day | ORAL | Status: DC
Start: 2021-07-04 — End: 2021-07-04

## 2021-07-03 MED ORDER — LACTATED RINGERS IV SOLN: INTRAVENOUS | Status: DC

## 2021-07-03 NOTE — ED Notes (Signed)
Doppler pulses obtained to bilateral dorsalis pedis and post tibial.

## 2021-07-03 NOTE — ED Provider Notes (Addendum)
[facility] Emergency Department Provider Note   ____________________________________________   Event Date/Time   First MD Initiated Contact with Patient 07/03/21 1626     (approximate)  I have reviewed the triage vital signs and the nursing notes.   HISTORY  Chief Complaint Shortness of Breath    HPI Scott Castro is a 65 y.o. male dialysis patient who reports he began feeling poorly 4 days ago on Tuesday.  He has gotten sicker since then developing fever some nausea vomiting coughing and shortness of breath.  EMS was called today and reported he is O2 sats were 87% on room air went up to 90 on 2 L and 4 L went up to 99.  Patient reports he had dialysis Wednesday before Thanksgiving and his next scheduled dialysis is tomorrow.  He reports he has had pneumonia in the past.  He also has COPD.         Past Medical History:  Diagnosis Date   Barrett's esophagus    Chronic kidney disease    CHRONIC\   Glomerulosclerosis, focal    Hyperparathyroidism due to renal insufficiency [facility])     Patient Active Problem List   Diagnosis Date Noted   Pneumonia 12/13/2020   Sepsis ([place]) 12/12/2020   Bicipital tenosynovitis 12/04/2020   Localized, primary osteoarthritis of hand 12/04/2020   Hyperkalemia    Acute pulmonary edema ([place]) 09/18/2019   End stage renal disease on dialysis ([place]) 09/18/2019   Hypertensive emergency 09/18/2019   Acute hypoxemic respiratory failure ([place]) 09/17/2019   COPD (chronic obstructive pulmonary disease) ([place]) 04/10/2019   COPD exacerbation ([place]) 04/09/2019   Nonintractable epilepsy with complex partial seizures ([place]) 08/20/2014   Hypertension 01/09/2013   Lung nodule, multiple 07/28/2012   Organ transplant candidate 02/04/2012   Arteriovenous fistula ([place]) 01/21/2012   At risk for dental problems 01/21/2012   Barrett esophagus 01/21/2012   FSGS (focal segmental glomerulosclerosis) 01/21/2012   Kidney transplanted 01/21/2012     Past Surgical History:  Procedure Laterality Date   AV FISTULA PLACEMENT     COLONOSCOPY N/A 03/22/2016   Procedure: COLONOSCOPY;  Surgeon: Manya Silvas, MD;  Location: [place];  Service: Endoscopy;  Laterality: N/A;   DG AV DIALYSIS GRAFT DECLOT OR     ESOPHAGOGASTRODUODENOSCOPY (EGD) WITH PROPOFOL  03/22/2016   Procedure: ESOPHAGOGASTRODUODENOSCOPY (EGD) WITH PROPOFOL;  Surgeon: Manya Silvas, MD;  Location: [facility] ENDOSCOPY;  Service: Endoscopy;;   [place] CATH      Prior to Admission medications   Medication Sig Start Date End Date Taking? Authorizing Provider  amLODipine (NORVASC) 10 MG tablet Take 10 mg by mouth daily at 6 (six) AM.    [provider]  cinacalcet (SENSIPAR) 60 MG tablet Take 60 mg by mouth daily.    [provider]  cloNIDine (CATAPRES) 0.3 MG tablet Take 0.3 mg by mouth 3 (three) times daily.    [provider]  enalapril (VASOTEC) 20 MG tablet Take 20 mg by mouth 2 (two) times daily.    [provider]  meloxicam (MOBIC) 15 MG tablet Take 1 tablet (15 mg total) by mouth daily as needed for pain. 05/19/21   Edrick Kins, DPM  metoprolol (LOPRESSOR) 100 MG tablet Take 100 mg by mouth 2 (two) times daily.    [provider]  minoxidil (LONITEN) 2.5 MG tablet Take by mouth 2 (two) times daily.  [provider]  pantoprazole (PROTONIX) 40 MG tablet Take 40 mg by mouth daily. 03/27/19   [provider]  pravastatin (PRAVACHOL) 20 MG tablet Take 20 mg by mouth daily. Patient not taking: Reported on 12/12/2020    [provider]  sevelamer carbonate (RENVELA) 800 MG tablet Take by mouth See admin instructions. Take 2400 mg by mouth three times daily, 1600 mg with snacks    [provider]    Allergies Patient has no known allergies.  No family history on file.  Social History Social History   Tobacco  Use   Smoking status: Never   Smokeless tobacco: Never  Substance Use Topics   Alcohol use: Not Currently    Comment: very little   Drug use: No    Review of Systems  Constitutional:  fever/chills Eyes: No visual changes. ENT: No sore throat. Cardiovascular: Denies chest pain. Respiratory: Denies shortness of breath. Gastrointestinal: Currently no abdominal pain.  No nausea, no vomiting.  No diarrhea.  No constipation. Genitourinary: Negative for dysuria. Musculoskeletal: Negative for back pain. Skin: Negative for rash. Neurological: Negative for headaches, focal weakness   ____________________________________________   PHYSICAL EXAM:  VITAL SIGNS: ED Triage Vitals  Enc Vitals Group     BP 07/03/21 1618 (!) 215/84     Pulse Rate 07/03/21 1618 (!) 102     Resp 07/03/21 1618 (!) 33     Temp 07/03/21 1618 (!) 103.1 F (39.5 C)     Temp Source 07/03/21 1618 Oral     SpO2 07/03/21 1618 94 %     Weight 07/03/21 1619 154 lb 5.2 oz (70 kg)     Height 07/03/21 1619 5\' 9"  (1.753 m)     Head Circumference --      Peak Flow --      Pain Score 07/03/21 1619 0     Pain Loc --      Pain Edu? --      Excl. in [place]? --     Constitutional: Alert and oriented.  Ill-appearing and febrile Eyes: Conjunctivae are normal.  Head: Atraumatic. Nose: No congestion/rhinnorhea. Mouth/Throat: Mucous membranes are moist.  Oropharynx non-erythematous. Neck: No stridor.  Cardiovascular: Normal rate, regular rhythm. Grossly normal heart sounds.  Good peripheral circulation. Respiratory: Normal respiratory effort.  No retractions. Lungs scattered crackles throughout worse in the bases bilaterally Gastrointestinal: Soft and nontender. No distention. No abdominal bruits.  Musculoskeletal: No lower extremity tenderness nor edema.  Neurologic:  Normal speech and language. No gross focal neurologic deficits are appreciated Skin:  Skin is warm, dry and intact. No rash  noted.   ____________________________________________   LABS (all labs ordered are listed, but only abnormal results are displayed)  Labs Reviewed  RESP PANEL BY RT-PCR (FLU A&B, COVID) ARPGX2 - Abnormal; Notable for the following components:      Result Value   SARS Coronavirus 2 by RT PCR POSITIVE (*)    All other components within normal limits  COMPREHENSIVE METABOLIC PANEL - Abnormal; Notable for the following components:   Glucose, Bld 115 (*)    BUN 49 (*)    Creatinine, Ser 9.56 (*)    Calcium 8.3 (*)    Albumin 3.4 (*)    GFR, Estimated 6 (*)    All other components within normal limits  CBC WITH DIFFERENTIAL/PLATELET - Abnormal; Notable for the following components:   RBC 3.50 (*)    Hemoglobin 10.9 (*)    HCT 35.3 (*)    MCV  100.9 (*)    Neutro Abs 8.1 (*)    Lymphs Abs 0.3 (*)    All other components within normal limits  CULTURE, BLOOD (ROUTINE X 2)  CULTURE, BLOOD (ROUTINE X 2)  URINE CULTURE  RESP PANEL BY RT-PCR (FLU A&B, COVID) ARPGX2  LACTIC ACID, PLASMA  PROTIME-INR  PROCALCITONIN  LACTIC ACID, PLASMA  URINALYSIS, ROUTINE W REFLEX MICROSCOPIC  APTT  URINALYSIS, COMPLETE (UACMP) WITH MICROSCOPIC   ____________________________________________  EKG   ____________________________________________  RADIOLOGY Gertha Calkin, personally viewed and evaluated these images (plain radiographs) as part of my medical decision making, as well as reviewing the written report by the radiologist.  ED MD interpretation: Chest x-ray reviewed by me and compared to old film shows new right-sided infiltrate  Official radiology report(s): DG Chest Port 1 View  Result Date: 07/03/2021 CLINICAL DATA:  Question sepsis. EXAM: PORTABLE CHEST 1 VIEW COMPARISON:  12/12/2020 FINDINGS: Ill-defined airspace density in the left mid lung with mild progression. Progressive left lower lobe consolidation since the prior study. Small left effusion Progressive right lower lobe  consolidation and right effusion. Heart size upper normal.  No definite heart failure. Stent in the left upper arm. IMPRESSION: Progressive bilateral airspace disease and bilateral effusions. This may be due to pneumonia. Heart failure considered less likely. Underlying mass lesion not excluded. Given the history of possible sepsis, this is likely pneumonia. Follow-up chest CT recommended if these areas do not show clearing. Electronically Signed   By: Franchot Gallo M.D.   On: 07/03/2021 16:49    ____________________________________________   PROCEDURES  Procedure(s) performed (including Critical Care): Critical care time 20 minutes.  This includes reviewing the patient's old records and his studies comparing them with his old studies and his old chest x-rays and speaking with the hospitalist.  Procedures   ____________________________________________   INITIAL IMPRESSION / ASSESSMENT AND PLAN / ED COURSE   ----------------------------------------- 5:39 PM on 07/03/2021 ----------------------------------------- Patient with 103 fever hypoxic on room air.  Chest x-ray most consistent with new pneumonia on the right.  His white count is not particularly elevated but he does have a left shift.  Lactic acid is not over 2 at this point.  Procalcitonin is still pending.  I will get him in the hospital.  He will need oxygen and antibiotics and dialysis tomorrow.  He does have a very wide pulse pressure his blood pressure last time was 155/47.  This goes along with developing sepsis.             ____________________________________________   FINAL CLINICAL IMPRESSION(S) / ED DIAGNOSES  Final diagnoses:  Hypoxia  Community acquired pneumonia of right lung, unspecified part of lung  Fever, unspecified fever cause  COVID     ED Discharge Orders     None        Note:  This document was prepared using Dragon voice recognition software and may include unintentional dictation  errors.    Nena Polio, MD 07/03/21 0017    Nena Polio, MD 07/03/21 [phone]

## 2021-07-03 NOTE — H&P (Signed)
History and Physical  TRUST LEH WCH:[id number] DOB: 07/15/1956 DOA: 07/03/2021  Referring physician: Nena Polio, MD PCP: Franciso Bend, MD  Patient coming from: Home  Chief Complaint: Shortness of breath  HPI: Scott Castro is a 65 y.o. male with medical history significant for hypertension, ESRD on HD, COPD/asthma due to secondhand smoke and peripheral arterial disease who presents to the emergency department due to 3-4 day onset of weakness, chest congestion, dry cough, fever and increased shortness of breath which worsens on ambulation.  Patient complained of worsening shortness of breath today, so he activated EMS.  On arrival of EMS team, he was noted to have an O2 sat of 87% on room air, this improved to 90% on supplemental oxygen at 2 LPM and 99% on submental oxygen via [place] at 4 LPM (patient does not use oxygen at baseline).  Last dialysis was on Wednesday (11/23) and his next scheduled dialysis is tomorrow.  He denies chest pain, nausea, vomiting, abdominal pain, diarrhea or constipation.  Patient states that he already obtained to COVID-19 virus vaccination (Moderna), but did not receive the booster dose.  ED Course:  In the emergency department, BP was 145/56, but other vital signs are within normal range, O2 sat was 97-99% on supplemental oxygen via [place] at 2 LPM.  Work-up in the ED showed macrocytic anemia and BUN/creatinine 49/9.56 with an estimated GFR of 6.  Albumin 3.4, procalcitonin 2.0, lactic acid 1.7.  Influenza A, B-.  SARS coronavirus 2 was positive.  Chest x-ray showed progressive bilateral airspace disease and bilateral effusions. This may be due to pneumonia. Heart failure considered less likely. Underlying mass lesion not excluded. Given the history of possible sepsis, this is likely pneumonia. Patient was started on IV ceftriaxone and azithromycin, IV Decadron and remdesivir were given.  IV hydration was provided.  Hospitalist was asked to admit patient for further  evaluation and management.  Review of Systems: Constitutional: Positive for chills and fever.  HENT: Negative for ear pain and sore throat.   Eyes: Negative for pain and visual disturbance.  Respiratory: Positive for cough, chest congestion and shortness of breath.   Cardiovascular: Negative for chest pain and palpitations.  Gastrointestinal: Negative for abdominal pain and vomiting.  Endocrine: Negative for polyphagia and polyuria.  Genitourinary: Negative for decreased urine volume, dysuria, enuresis Musculoskeletal: Negative for arthralgias and back pain.  Skin: Negative for color change and rash.  Allergic/Immunologic: Negative for immunocompromised state.  Neurological: Negative for tremors, syncope, speech difficulty, weakness, light-headedness and headaches.  Hematological: Does not bruise/bleed easily.  All other systems reviewed and are negative  Past Medical History:  Diagnosis Date   Barrett's esophagus    Chronic kidney disease    CHRONIC\   Glomerulosclerosis, focal    Hyperparathyroidism due to renal insufficiency [facility])    Past Surgical History:  Procedure Laterality Date   AV FISTULA PLACEMENT     COLONOSCOPY N/A 03/22/2016   Procedure: COLONOSCOPY;  Surgeon: Manya Silvas, MD;  Location: [place];  Service: Endoscopy;  Laterality: N/A;   DG AV DIALYSIS GRAFT DECLOT OR     ESOPHAGOGASTRODUODENOSCOPY (EGD) WITH PROPOFOL  03/22/2016   Procedure: ESOPHAGOGASTRODUODENOSCOPY (EGD) WITH PROPOFOL;  Surgeon: Manya Silvas, MD;  Location: [place];  Service: Endoscopy;;   [place] [facility]      Social History:  reports that he has never smoked. He has never used smokeless tobacco. He  reports that he does not currently use alcohol. He reports that he does not use drugs.   No Known Allergies  No family history on file.   Prior to Admission medications   Medication Sig Start Date End Date  Taking? Authorizing Provider  amLODipine (NORVASC) 10 MG tablet Take 10 mg by mouth daily at 6 (six) AM.    [provider]  cinacalcet (SENSIPAR) 60 MG tablet Take 60 mg by mouth daily.    [provider]  cloNIDine (CATAPRES) 0.3 MG tablet Take 0.3 mg by mouth 3 (three) times daily.    [provider]  enalapril (VASOTEC) 20 MG tablet Take 20 mg by mouth 2 (two) times daily.    [provider]  meloxicam (MOBIC) 15 MG tablet Take 1 tablet (15 mg total) by mouth daily as needed for pain. 05/19/21   Edrick Kins, DPM  metoprolol (LOPRESSOR) 100 MG tablet Take 100 mg by mouth 2 (two) times daily.    [provider]  minoxidil (LONITEN) 2.5 MG tablet Take by mouth 2 (two) times daily.    [provider]  pantoprazole (PROTONIX) 40 MG tablet Take 40 mg by mouth daily. 03/27/19   [provider]  pravastatin (PRAVACHOL) 20 MG tablet Take 20 mg by mouth daily. Patient not taking: Reported on 12/12/2020    [provider]  sevelamer carbonate (RENVELA) 800 MG tablet Take by mouth See admin instructions. Take 2400 mg by mouth three times daily, 1600 mg with snacks    [provider]    Physical Exam: BP (!) 150/57   Pulse 81   Temp 98.8 F (37.1 C) (Oral)   Resp 18   Ht 5\' 9"  (1.753 m)   Wt 70 kg   SpO2 99%   BMI 22.79 kg/m   General: 65 y.o. year-old male ill appearing but in no acute distress.  Alert and oriented x3. HEENT: NCAT, EOMI Neck: Supple, trachea medial Cardiovascular: Regular rate and rhythm with no rubs or gallops.  No thyromegaly or JVD noted.Right lower extremity edema. 2/4 pulses in all 4 extremities. Respiratory: Diffuse rales worse in lower lobes bilaterally on auscultation with no wheezes. Good inspiratory effort. Abdomen: Soft, nontender nondistended with normal bowel sounds x4 quadrants. Muskuloskeletal: RLE swelling > LLE.  Right foot erythema.  Palpable dorsalis pedis pulse bilaterally  with Doppler Lymphatics: No axillary or supraclavicular lymphadenopathy Neuro: CN II-XII intact, strength 5/5 x 4, sensation, reflexes intact Skin: Right foot with erythema, but normal warmth to touch.  No ulcerative lesions noted  Psychiatry: Judgement and insight appear normal. Mood is appropriate for condition and setting          Labs on Admission:  Basic Metabolic Panel: Recent Labs  Lab 07/03/21 1623  NA 140  K 5.0  CL 100  CO2 27  GLUCOSE 115*  BUN 49*  CREATININE 9.56*  CALCIUM 8.3*   Liver Function Tests: Recent Labs  Lab 07/03/21 1623  AST 15  ALT 12  ALKPHOS 81  BILITOT 1.2  PROT 7.4  ALBUMIN 3.4*   No results for input(s): LIPASE, AMYLASE in the last 168 hours. No results for input(s): AMMONIA in the last 168 hours. CBC: Recent Labs  Lab 07/03/21 1623  WBC 9.4  NEUTROABS 8.1*  HGB 10.9*  HCT 35.3*  MCV 100.9*  PLT 202   Cardiac Enzymes: No results for input(s): CKTOTAL, CKMB, CKMBINDEX, TROPONINI in the last 168 hours.  BNP (last 3 results) No results for  input(s): BNP in the last 8760 hours.  ProBNP (last 3 results) No results for input(s): PROBNP in the last 8760 hours.  CBG: No results for input(s): GLUCAP in the last 168 hours.  Radiological Exams on Admission: DG Chest Port 1 View  Result Date: 07/03/2021 CLINICAL DATA:  Question sepsis. EXAM: PORTABLE CHEST 1 VIEW COMPARISON:  12/12/2020 FINDINGS: Ill-defined airspace density in the left mid lung with mild progression. Progressive left lower lobe consolidation since the prior study. Small left effusion Progressive right lower lobe consolidation and right effusion. Heart size upper normal.  No definite heart failure. Stent in the left upper arm. IMPRESSION: Progressive bilateral airspace disease and bilateral effusions. This may be due to pneumonia. Heart failure considered less likely. Underlying mass lesion not excluded. Given the history of possible sepsis, this is likely pneumonia.  Follow-up chest CT recommended if these areas do not show clearing. Electronically Signed   By: Franchot Gallo M.D.   On: 07/03/2021 16:49    EKG: I independently viewed the EKG done and my findings are as followed: Normal sinus rhythm at a rate of 94 bpm  Assessment/Plan Present on Admission:  Pneumonia due to COVID-19 virus  Acute hypoxemic respiratory failure (HCC)  COPD (chronic obstructive pulmonary disease) ([place])  Principal Problem:   Pneumonia due to COVID-19 virus Active Problems:   COPD (chronic obstructive pulmonary disease) ([place])   Acute hypoxemic respiratory failure (HCC)   ESRD (end stage renal disease) ([place])   Essential hypertension   CAP (community acquired pneumonia)   Hypoalbuminemia due to protein-calorie malnutrition ([place])   Macrocytic anemia   PAD (peripheral artery disease) (HCC)   GERD (gastroesophageal reflux disease)   Mixed hyperlipidemia   Acute respiratory failure with hypoxia secondary to pneumonia secondary to COVID-19 virus with superimposed CAP POA Chest x-ray was suggestive of pneumonia Continue albuterol q.6h Continue IV Solu-Medrol  Continue IV Remdesivir per pharmacy protocol Continue vitamin-C 500 mg p.o. Daily Continue zinc 220 mg p.o. Daily Continue Mucinex, Robitussin and Tussionex Continue Tylenol p.r.n. for fever Continue supplemental oxygen to maintain O2 sat > or = 94% with plan to wean patient off supplemental oxygen as tolerated (of note, patient does not use oxygen at baseline) Continue incentive spirometry and flutter valve q30min as tolerated Encourage proning, early ambulation, and side laying as tolerated Continue airborne isolation precaution Continue monitoring daily inflammatory markers Procalcitonin was 2.0, patient was started on ceftriaxone and azithromycin with plan to discontinue/de-escalate based on blood culture, sputum culture, urine Legionella, strep pneumo.  COPD/asthma Continue DuoNebs as needed  ESRD on  HD Last dialysis was on Wednesday (11/23) Nephrology will be consulted for maintenance dialysis Continue [name], Sensipar  Hypoalbuminemia secondary to mild protein calorie malnutrition Albumin 3.4, protein supplement to be provided  RLE swelling This was possibly secondary to patient's history of PAD Right lower extremity ultrasound will be obtained to rule out DVT  PAD Patient states that he follows with Dr. Ruby Cola (podiatry) Bilateral palpable pulses with Doppler ABI with TBI bilateral lower extremities ordered by podiatrist per medical record Patient will need to follow-up with vascular surgery  GERD/history of Barrett's esophagus Continue Protonix  Hyperlipidemia Patient was not on any antihyperlipidemic medication at this time per med rec  Essential hypertension Continue clonidine, Lopressor   DVT prophylaxis: Lovenox  Code Status: Full code  Family Communication: None at bedside  Disposition Plan:  Patient is from:  home Anticipated DC to:                   SNF or family members home Anticipated DC date:               2-3 days Anticipated DC barriers:          Patient requires inpatient management due to respiratory failure with hypoxia in the setting of COVID-19 Virus Pneumonia and CAP POA  Consults called: None  Admission status: Inpatient    Bernadette Hoit MD [facility]  07/03/2021, 11:09 PM

## 2021-07-03 NOTE — ED Triage Notes (Signed)
Pt to ED via [facility] from home for shortness of breath x 3 days, pt also c/o chills, nausea, and vomiting. Pt was given 1 gram of tylenol with EMS as well as 4 mg of zofran and 150 cc of fluids. Pt does not wear oxygen at baseline but EMS reports SpO2 at 87% on room air, Pt was placed on 2 liters with sats coming up to 90%, oxygen was increased to 4 liters with sats coming up to 99%. Pt is dialysis pt and has dialysis port in his left arm. Pt has 22 G IV in the right hand. Vital per EMS Temp 101.0 HR 100, RR 30  Pt is in NAD, RR equal and unlabored, pt is able to speak in complete sentences at this time.

## 2021-07-03 NOTE — Consult Note (Signed)
CODE SEPSIS - PHARMACY COMMUNICATION  **Broad Spectrum Antibiotics should be administered within 1 hour of Sepsis diagnosis**  Time Code Sepsis Called/Page Received: 1637  Antibiotics Ordered: Azithromycin & Ceftriaxone  Time of 1st antibiotic administration: 6815  Additional action taken by pharmacy: none  If necessary, Name of Provider/Nurse Contacted: n/a    Danisa Kopec Rodriguez-Guzman PharmD, BCPS 07/03/2021 4:43 PM

## 2021-07-03 NOTE — Sepsis Progress Note (Signed)
[facility] is monitoring this sepsis

## 2021-07-03 NOTE — Progress Notes (Signed)
Remdesivir - Pharmacy Brief Note   O:  ALT: 12 CXR: Progressive bilateral airspace disease and bilateral effusions. This may be due to pneumonia. Heart failure considered less likely. Underlying mass lesion not excluded. Given the history of possible sepsis, this is likely pneumonia. Follow-up chest CT recommended if these areas do not show clearing SpO2: 99% on 2L [place]   A/P:  Remdesivir 200 mg IVPB once followed by 100 mg IVPB daily x 4 days.   Sherilyn Banker, PharmD Clinical Pharmacist 07/03/2021 8:17 PM

## 2021-07-04 ENCOUNTER — Inpatient Hospital Stay: Payer: Medicare Other

## 2021-07-04 ENCOUNTER — Encounter: Payer: Self-pay | Admitting: Internal Medicine

## 2021-07-04 DIAGNOSIS — J1282 Pneumonia due to coronavirus disease 2019: Secondary | ICD-10-CM | POA: Diagnosis not present

## 2021-07-04 DIAGNOSIS — U071 COVID-19: Secondary | ICD-10-CM | POA: Diagnosis not present

## 2021-07-04 LAB — HEPATITIS B SURFACE ANTIBODY,QUALITATIVE: Hep B S Ab: REACTIVE — AB

## 2021-07-04 LAB — CBC WITH DIFFERENTIAL/PLATELET
Abs Immature Granulocytes: 0.02 10*3/uL (ref 0.00–0.07)
Basophils Absolute: 0 10*3/uL (ref 0.0–0.1)
Basophils Relative: 0 %
Eosinophils Absolute: 0 10*3/uL (ref 0.0–0.5)
Eosinophils Relative: 0 %
HCT: 29.4 % — ABNORMAL LOW (ref 39.0–52.0)
Hemoglobin: 9.2 g/dL — ABNORMAL LOW (ref 13.0–17.0)
Immature Granulocytes: 0 %
Lymphocytes Relative: 5 %
Lymphs Abs: 0.4 10*3/uL — ABNORMAL LOW (ref 0.7–4.0)
MCH: 32.1 pg (ref 26.0–34.0)
MCHC: 31.3 g/dL (ref 30.0–36.0)
MCV: 102.4 fL — ABNORMAL HIGH (ref 80.0–100.0)
Monocytes Absolute: 0.4 10*3/uL (ref 0.1–1.0)
Monocytes Relative: 6 %
Neutro Abs: 6.6 10*3/uL (ref 1.7–7.7)
Neutrophils Relative %: 89 %
Platelets: 145 10*3/uL — ABNORMAL LOW (ref 150–400)
RBC: 2.87 MIL/uL — ABNORMAL LOW (ref 4.22–5.81)
RDW: 14.4 % (ref 11.5–15.5)
WBC: 7.5 10*3/uL (ref 4.0–10.5)
nRBC: 0 % (ref 0.0–0.2)

## 2021-07-04 LAB — COMPREHENSIVE METABOLIC PANEL
ALT: 9 U/L (ref 0–44)
AST: 11 U/L — ABNORMAL LOW (ref 15–41)
Albumin: 2.7 g/dL — ABNORMAL LOW (ref 3.5–5.0)
Alkaline Phosphatase: 67 U/L (ref 38–126)
Anion gap: 11 (ref 5–15)
BUN: 59 mg/dL — ABNORMAL HIGH (ref 8–23)
CO2: 27 mmol/L (ref 22–32)
Calcium: 8 mg/dL — ABNORMAL LOW (ref 8.9–10.3)
Chloride: 102 mmol/L (ref 98–111)
Creatinine, Ser: 10.59 mg/dL — ABNORMAL HIGH (ref 0.61–1.24)
GFR, Estimated: 5 mL/min — ABNORMAL LOW (ref >60)
Glucose, Bld: 118 mg/dL — ABNORMAL HIGH (ref 70–99)
Potassium: 6.3 mmol/L (ref 3.5–5.1)
Sodium: 140 mmol/L (ref 135–145)
Total Bilirubin: 0.7 mg/dL (ref 0.3–1.2)
Total Protein: 6.3 g/dL — ABNORMAL LOW (ref 6.5–8.1)

## 2021-07-04 LAB — MAGNESIUM: Magnesium: 2.3 mg/dL (ref 1.7–2.4)

## 2021-07-04 LAB — CBG MONITORING, ED: Glucose-Capillary: 111 mg/dL — ABNORMAL HIGH (ref 70–99)

## 2021-07-04 LAB — C-REACTIVE PROTEIN: CRP: 15.3 mg/dL — ABNORMAL HIGH (ref <1.0)

## 2021-07-04 LAB — POTASSIUM: Potassium: 5.4 mmol/L — ABNORMAL HIGH (ref 3.5–5.1)

## 2021-07-04 LAB — GLUCOSE, CAPILLARY
Glucose-Capillary: 199 mg/dL — ABNORMAL HIGH (ref 70–99)
Glucose-Capillary: 184 mg/dL — ABNORMAL HIGH (ref 70–99)

## 2021-07-04 LAB — D-DIMER, QUANTITATIVE: D-Dimer, Quant: 2.21 ug/mL-FEU — ABNORMAL HIGH (ref 0.00–0.50)

## 2021-07-04 LAB — FERRITIN: Ferritin: 874 ng/mL — ABNORMAL HIGH (ref 24–336)

## 2021-07-04 LAB — PHOSPHORUS: Phosphorus: 7.1 mg/dL — ABNORMAL HIGH (ref 2.5–4.6)

## 2021-07-04 LAB — HEPATITIS B SURFACE ANTIGEN: Hepatitis B Surface Ag: NONREACTIVE

## 2021-07-04 IMAGING — US US EXTREM LOW VENOUS*R*
1 series · 14 of 24 positions shown · non-contrast
Comparison: None.

CLINICAL DATA: Pain and swelling

EXAM:
Right LOWER EXTREMITY VENOUS DOPPLER ULTRASOUND
TECHNIQUE: Gray-scale sonography with compression, as well as color and duplex
ultrasound, were performed to evaluate the deep venous system(s)
from the level of the common femoral vein through the popliteal and
proximal calf veins.

[Series 1: us venous img lower uni right (dvt) · portal-venous · 14 of 39 slices shown]
[im 1/39]
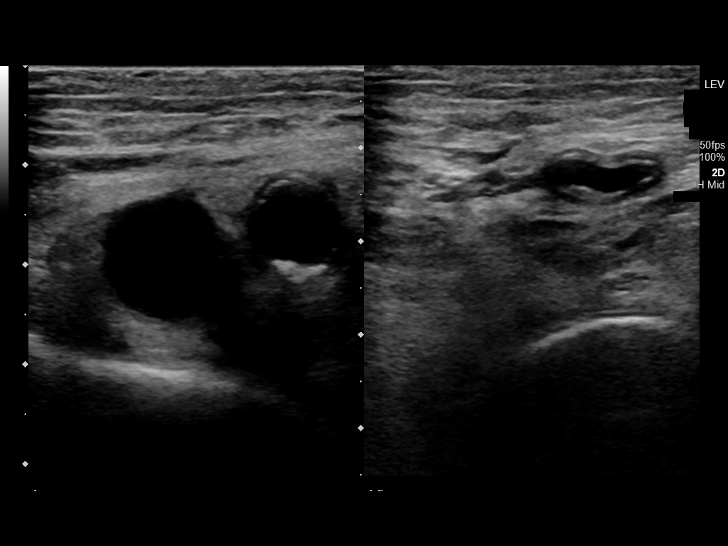
[im 4/39]
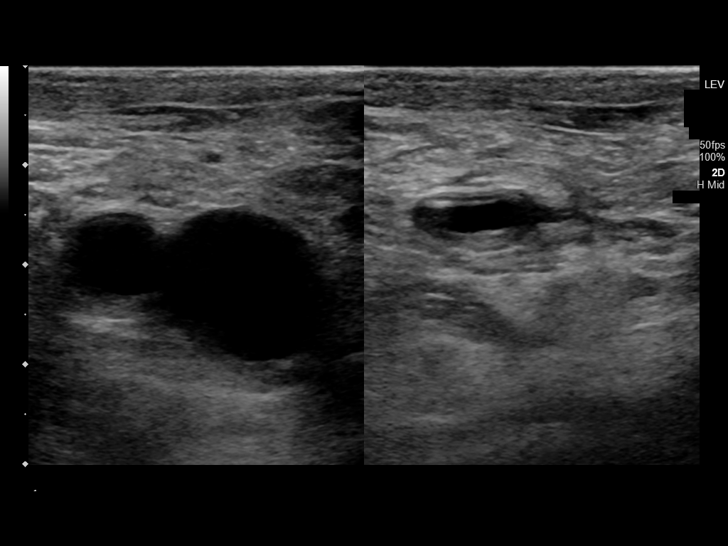
[im 7/39]
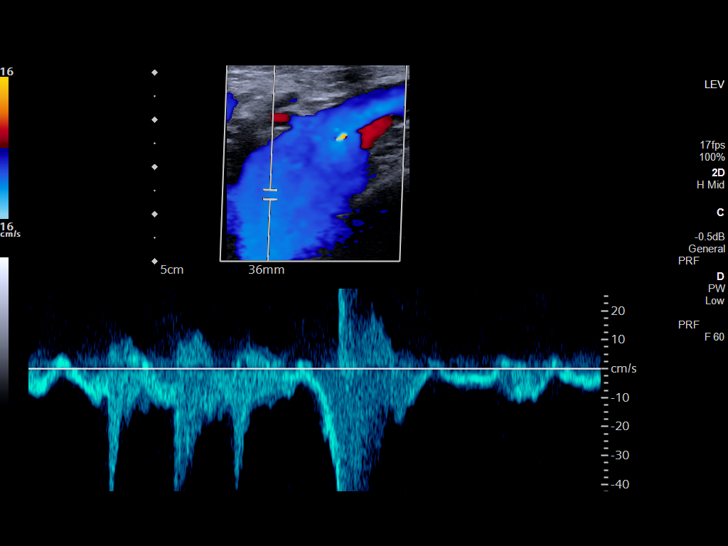
[im 10/39]
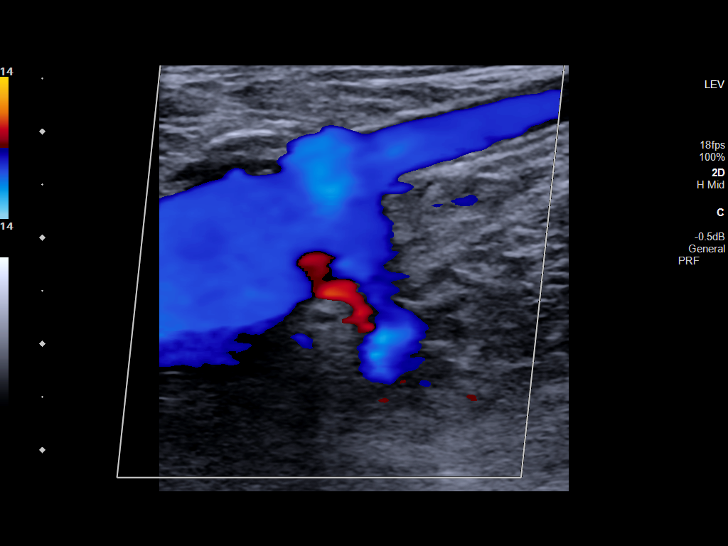
[im 12/39]
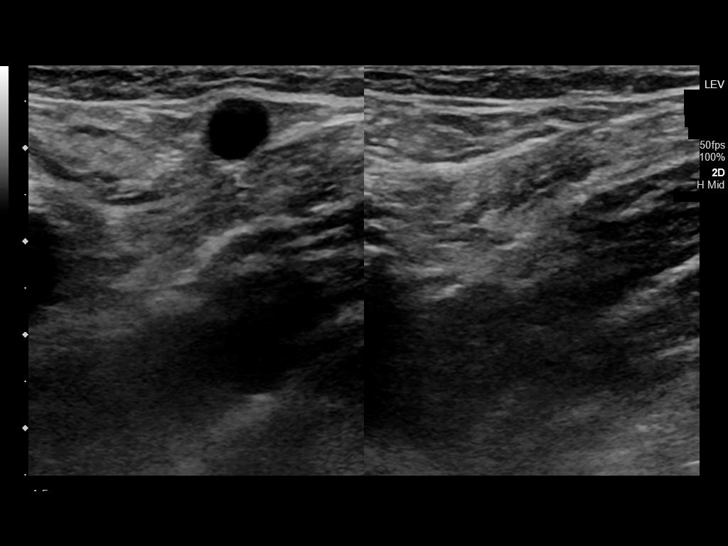
[im 15/39]
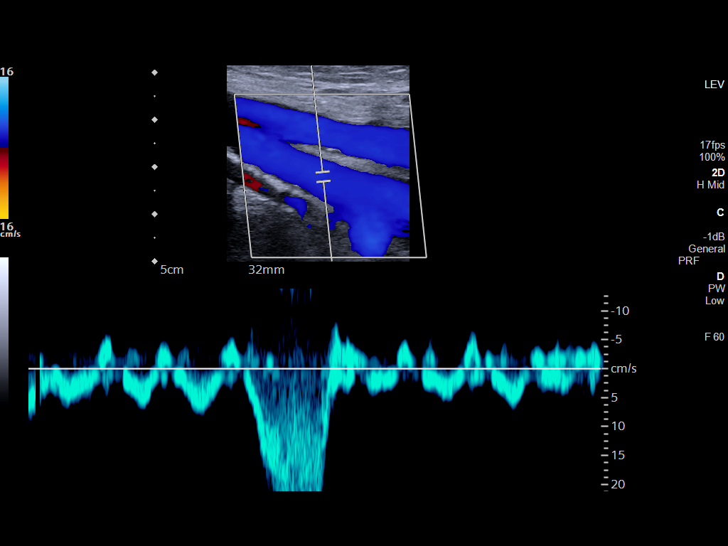
[im 19/39]
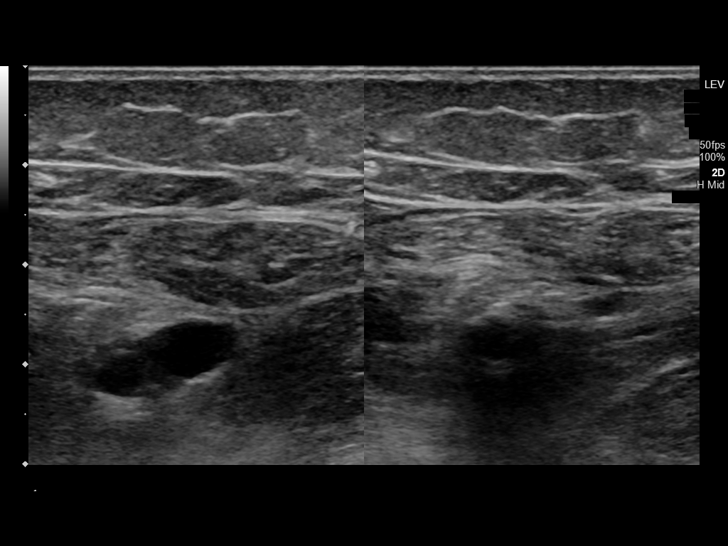
[im 20/39]
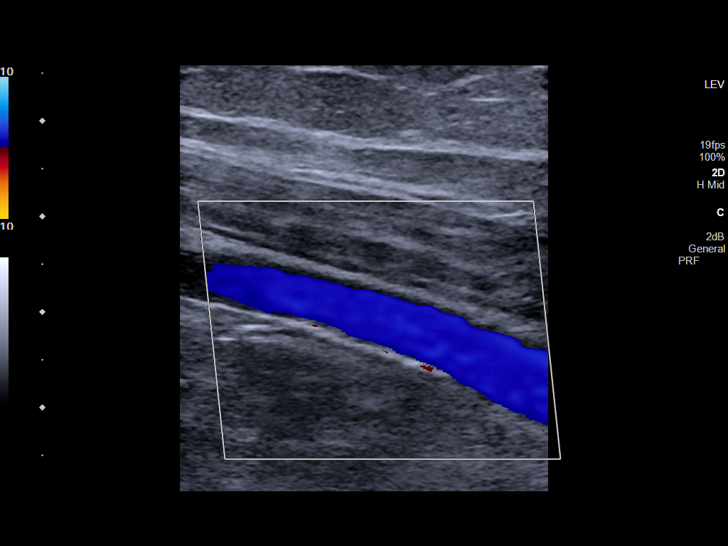
[im 24/39]
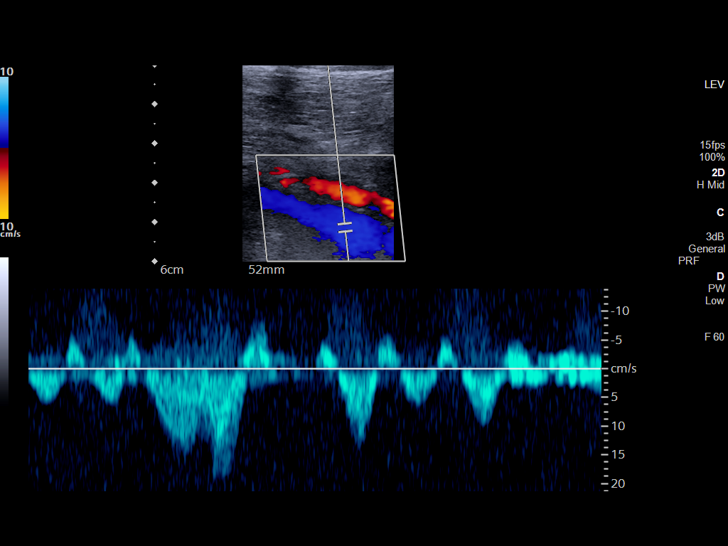
[im 27/39]
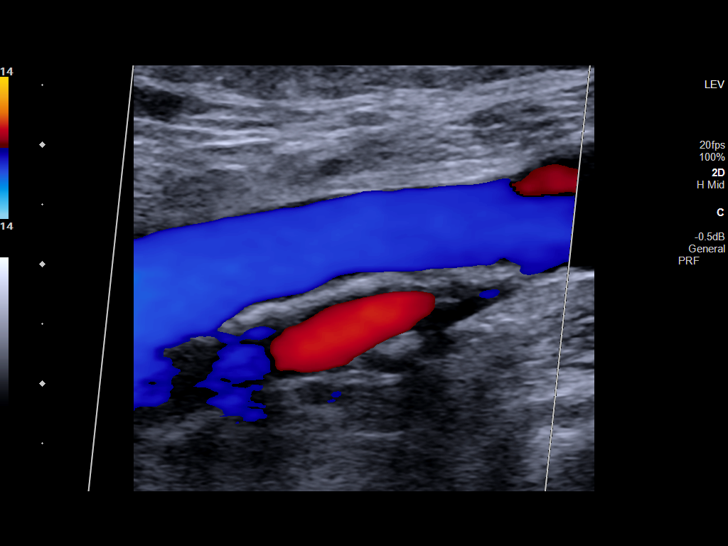
[im 30/39]
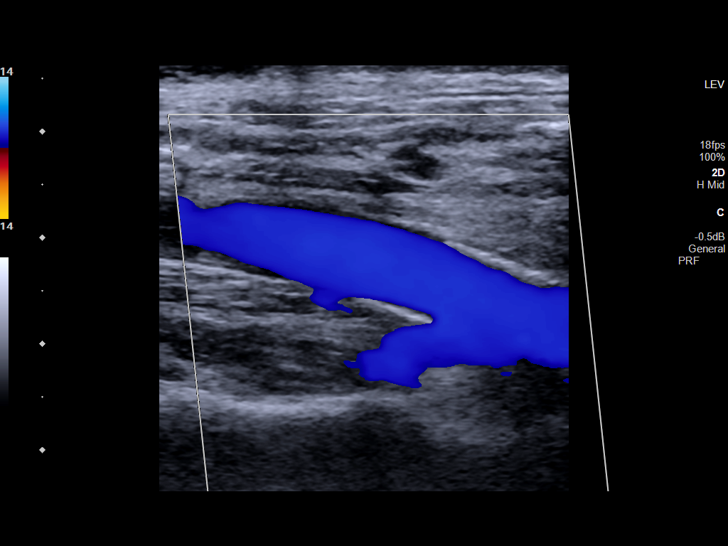
[im 32/39]
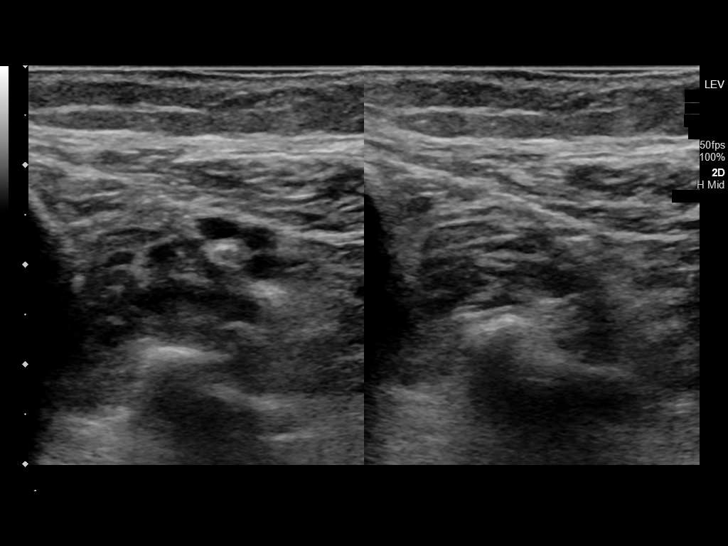
[im 35/39]
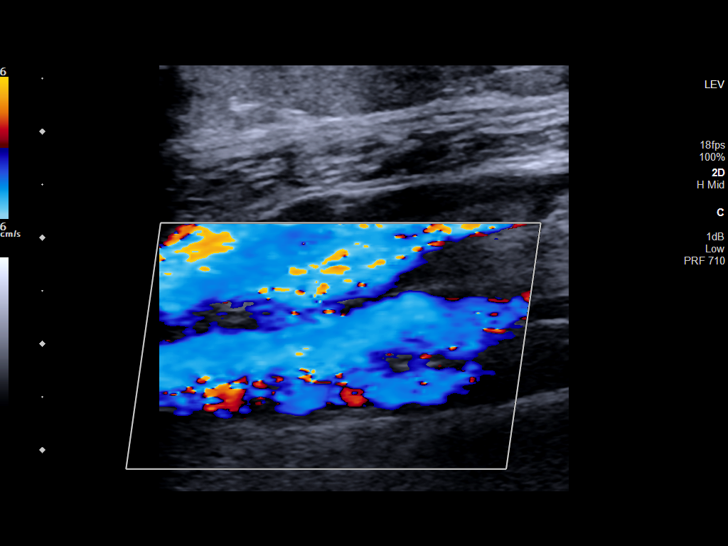
[im 39/39]
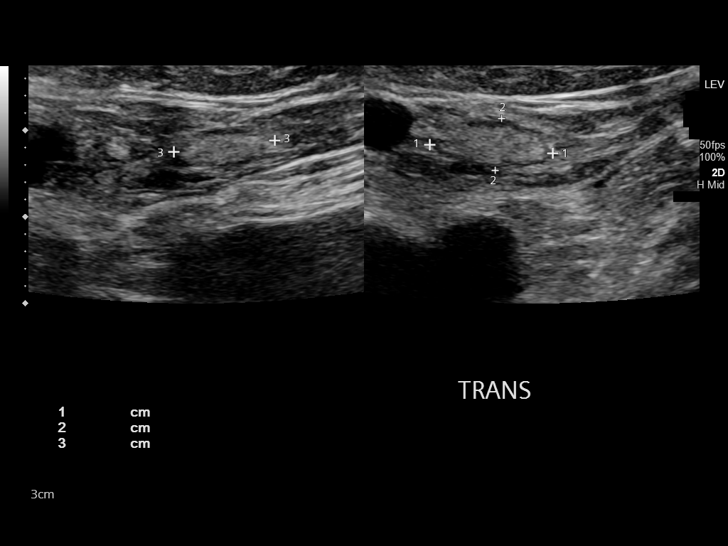

[14 of 24 positions shown; findings below may reference images not displayed]

FINDINGS: VENOUS

Normal compressibility of the common femoral, superficial femoral,
and popliteal veins, as well as the visualized calf veins.
Visualized portions of profunda femoral vein and great saphenous
vein unremarkable. No filling defects to suggest DVT on grayscale or
color Doppler imaging. Doppler waveforms show normal direction of
venous flow, normal respiratory plasticity and response to
augmentation.

Limited views of the contralateral common femoral vein are
unremarkable.

OTHER

There are enlarged lymph nodes with fatty hilum in the right
inguinal region.

Limitations: none
IMPRESSION: There are no signs of deep venous thrombosis in the right lower
extremity.

## 2021-07-04 MED ORDER — SEVELAMER CARBONATE 800 MG PO TABS
2400.0000 mg | ORAL_TABLET | Freq: Three times a day (TID) | ORAL | Status: DC
  Administered 2021-07-04 – 2021-07-08 (×9): 2400 mg via ORAL
  Filled 2021-07-04 (×11): qty 3

## 2021-07-04 MED ORDER — INSULIN ASPART 100 UNIT/ML IV SOLN
5.0000 [IU] | Freq: Once | INTRAVENOUS | Status: AC
  Administered 2021-07-04: 5 [IU] via INTRAVENOUS
  Filled 2021-07-04: qty 0.05

## 2021-07-04 MED ORDER — INSULIN ASPART 100 UNIT/ML IJ SOLN
0.0000 [IU] | Freq: Three times a day (TID) | INTRAMUSCULAR | Status: DC
  Administered 2021-07-04: 2 [IU] via SUBCUTANEOUS
  Administered 2021-07-05: 17:00:00 1 [IU] via SUBCUTANEOUS
  Administered 2021-07-05: 13:00:00 3 [IU] via SUBCUTANEOUS
  Administered 2021-07-06 – 2021-07-07 (×4): 1 [IU] via SUBCUTANEOUS
  Administered 2021-07-08: 2 [IU] via SUBCUTANEOUS
  Filled 2021-07-04 (×7): qty 1

## 2021-07-04 MED ORDER — ZINC SULFATE 220 (50 ZN) MG PO CAPS
220.0000 mg | ORAL_CAPSULE | Freq: Every day | ORAL | Status: DC
  Administered 2021-07-05 – 2021-07-08 (×4): 220 mg via ORAL
  Filled 2021-07-04 (×4): qty 1

## 2021-07-04 MED ORDER — METOPROLOL TARTRATE 50 MG PO TABS
100.0000 mg | ORAL_TABLET | Freq: Two times a day (BID) | ORAL | Status: DC
  Administered 2021-07-04 – 2021-07-08 (×9): 100 mg via ORAL
  Filled 2021-07-04 (×9): qty 2

## 2021-07-04 MED ORDER — DEXTROSE 50 % IV SOLN
1.0000 | Freq: Once | INTRAVENOUS | Status: AC
  Administered 2021-07-04: 50 mL via INTRAVENOUS
  Filled 2021-07-04: qty 50

## 2021-07-04 MED ORDER — METHYLPREDNISOLONE SODIUM SUCC 40 MG IJ SOLR
0.5000 mg/kg | Freq: Two times a day (BID) | INTRAMUSCULAR | Status: DC
  Administered 2021-07-04 – 2021-07-05 (×3): 35.2 mg via INTRAVENOUS
  Filled 2021-07-04 (×3): qty 1

## 2021-07-04 MED ORDER — PREDNISONE 50 MG PO TABS: 50.0000 mg | ORAL_TABLET | Freq: Every day | ORAL | Status: DC

## 2021-07-04 MED ORDER — ONDANSETRON HCL 4 MG/2ML IJ SOLN: 4.0000 mg | Freq: Four times a day (QID) | INTRAMUSCULAR | Status: DC | PRN

## 2021-07-04 MED ORDER — CHLORHEXIDINE GLUCONATE CLOTH 2 % EX PADS
6.0000 | MEDICATED_PAD | Freq: Every day | CUTANEOUS | Status: DC
  Administered 2021-07-05 – 2021-07-08 (×4): 6 via TOPICAL
  Filled 2021-07-04: qty 6

## 2021-07-04 MED ORDER — ALTEPLASE 2 MG IJ SOLR
2.0000 mg | Freq: Once | INTRAMUSCULAR | Status: DC | PRN
  Filled 2021-07-04: qty 2

## 2021-07-04 MED ORDER — ACETAMINOPHEN 325 MG PO TABS: 650.0000 mg | ORAL_TABLET | Freq: Four times a day (QID) | ORAL | Status: DC | PRN

## 2021-07-04 MED ORDER — INSULIN ASPART 100 UNIT/ML IJ SOLN
3.0000 [IU] | Freq: Three times a day (TID) | INTRAMUSCULAR | Status: DC
  Administered 2021-07-04 – 2021-07-07 (×8): 3 [IU] via SUBCUTANEOUS
  Filled 2021-07-04 (×9): qty 1

## 2021-07-04 MED ORDER — LIDOCAINE-PRILOCAINE 2.5-2.5 % EX CREA
1.0000 "application " | TOPICAL_CREAM | CUTANEOUS | Status: DC | PRN
  Filled 2021-07-04: qty 5

## 2021-07-04 MED ORDER — ASCORBIC ACID 500 MG PO TABS
500.0000 mg | ORAL_TABLET | Freq: Every day | ORAL | Status: DC
  Administered 2021-07-05 – 2021-07-08 (×4): 500 mg via ORAL
  Filled 2021-07-04 (×4): qty 1

## 2021-07-04 MED ORDER — CINACALCET HCL 30 MG PO TABS
60.0000 mg | ORAL_TABLET | Freq: Every day | ORAL | Status: DC
  Administered 2021-07-05 – 2021-07-08 (×4): 60 mg via ORAL
  Filled 2021-07-04 (×5): qty 2

## 2021-07-04 MED ORDER — PENTAFLUOROPROP-TETRAFLUOROETH EX AERO
1.0000 "application " | INHALATION_SPRAY | CUTANEOUS | Status: DC | PRN
  Filled 2021-07-04: qty 30

## 2021-07-04 MED ORDER — ONDANSETRON HCL 4 MG PO TABS: 4.0000 mg | ORAL_TABLET | Freq: Four times a day (QID) | ORAL | Status: DC | PRN

## 2021-07-04 MED ORDER — CLONIDINE HCL 0.1 MG PO TABS
0.3000 mg | ORAL_TABLET | Freq: Two times a day (BID) | ORAL | Status: DC
  Administered 2021-07-04: 0.3 mg via ORAL
  Filled 2021-07-04 (×3): qty 3

## 2021-07-04 MED ORDER — SODIUM CHLORIDE 0.9 % IV SOLN: 100.0000 mL | INTRAVENOUS | Status: DC | PRN

## 2021-07-04 MED ORDER — HEPARIN SODIUM (PORCINE) 5000 UNIT/ML IJ SOLN
5000.0000 [IU] | Freq: Three times a day (TID) | INTRAMUSCULAR | Status: DC
  Administered 2021-07-04 – 2021-07-07 (×9): 5000 [IU] via SUBCUTANEOUS
  Filled 2021-07-04 (×12): qty 1

## 2021-07-04 MED ORDER — PATIROMER SORBITEX CALCIUM 8.4 G PO PACK
16.8000 g | PACK | Freq: Once | ORAL | Status: AC
  Administered 2021-07-04: 16.8 g via ORAL
  Filled 2021-07-04: qty 2

## 2021-07-04 MED ORDER — HEPARIN SODIUM (PORCINE) 1000 UNIT/ML DIALYSIS
1000.0000 [IU] | INTRAMUSCULAR | Status: DC | PRN
  Filled 2021-07-04: qty 1

## 2021-07-04 MED ORDER — HYDROCOD POLST-CPM POLST ER 10-8 MG/5ML PO SUER: 5.0000 mL | Freq: Two times a day (BID) | ORAL | Status: DC | PRN

## 2021-07-04 MED ORDER — ENOXAPARIN SODIUM 30 MG/0.3ML IJ SOSY: 30.0000 mg | PREFILLED_SYRINGE | INTRAMUSCULAR | Status: DC

## 2021-07-04 MED ORDER — PANTOPRAZOLE SODIUM 40 MG PO TBEC
40.0000 mg | DELAYED_RELEASE_TABLET | Freq: Every day | ORAL | Status: DC
  Administered 2021-07-05 – 2021-07-08 (×4): 40 mg via ORAL
  Filled 2021-07-04 (×3): qty 1

## 2021-07-04 MED ORDER — ALBUTEROL SULFATE HFA 108 (90 BASE) MCG/ACT IN AERS
2.0000 | INHALATION_SPRAY | Freq: Four times a day (QID) | RESPIRATORY_TRACT | Status: DC
  Administered 2021-07-04 – 2021-07-08 (×14): 2 via RESPIRATORY_TRACT
  Filled 2021-07-04: qty 6.7

## 2021-07-04 MED ORDER — LIDOCAINE HCL (PF) 1 % IJ SOLN
5.0000 mL | INTRAMUSCULAR | Status: DC | PRN
  Filled 2021-07-04: qty 5

## 2021-07-04 MED ORDER — GUAIFENESIN-DM 100-10 MG/5ML PO SYRP: 10.0000 mL | ORAL_SOLUTION | ORAL | Status: DC | PRN

## 2021-07-04 NOTE — Progress Notes (Signed)
[facility]  ROUNDING NOTE   Subjective:   Scott Castro is a 65 y.o. male with past medical conditions including peripheral artery disease, hypertension, asthma, end-stage renal disease on hemodialysis.  Patient presents to the emergency room with complaints of weakness, chest congestion, dry cough and fever with increased shortness of breath for 3 to 4 days.  Patient was found to be COVID-positive with pneumonia and was admitted for Hypoxia [R09.02] Right leg swelling [M79.89] Fever, unspecified fever cause [R50.9] Community acquired pneumonia of right lung, unspecified part of lung [J18.9] COVID [U07.1] Pneumonia due to COVID-19 virus [U07.1, J12.82]  Patient is known to our practice from multiple hospital admissions.  He currently receives outpatient dialysis treatments at [facility] on a TTS schedule.  He is supervised by [facility] nephrology.  He states he has progressively felt worse over the past week, unable to eat or drink much.  He has maintained dialysis treatments with the last treatment being this past Wednesday.  Patient is seen sitting up in bed eating breakfast.  Currently on room air satting 91%.  Denies nausea, vomiting, and diarrhea.  Denies chest pain, and other discomfort. Pertinent lab work on arrival include potassium 5.0, sodium 140, creatinine 9.56 with GFR 6, influenza a/B-, and COVID-19 positive.  Chest x-ray shows progressive bilateral airspace disease and bilateral effusions, questionable pneumonia.  We have been consulted to manage dialysis needs during this admission.  Objective:  Vital signs in last 24 hours:  Temp:  [98 F (36.7 C)-103.1 F (39.5 C)] 98 F (36.7 C) (11/26 1133) Pulse Rate:  [55-102] 63 (11/26 1133) Resp:  [14-33] 16 (11/26 1133) BP: (101-215)/(35-91) 116/58 (11/26 1133) SpO2:  [90 %-100 %] 91 % (11/26 1133) Weight:  [70 kg] 70 kg (11/25 1619)  Weight change:  Filed Weights   07/03/21 1619  Weight: 70 kg     Intake/Output: I/O last 3 completed shifts: In: 250 [IV Piggyback:250] Out: -    Intake/Output this shift:  No intake/output data recorded.  Physical Exam: General: NAD, sitting up in bed eating  Head: Normocephalic, atraumatic. Moist oral mucosal membranes  Eyes: Anicteric  Lungs:  Basilar Rales, normal effort  Heart: Regular rate and rhythm  Abdomen:  Soft, nontender  Extremities: No peripheral edema.  Neurologic: Nonfocal, moving all four extremities  Skin: No lesions  Access: Left aVF    Basic Metabolic Panel: Recent Labs  Lab 07/03/21 1623 07/04/21 0641 07/04/21 1235  NA 140 140  --   K 5.0 6.3* 5.4*  CL 100 102  --   CO2 27 27  --   GLUCOSE 115* 118*  --   BUN 49* 59*  --   CREATININE 9.56* 10.59*  --   CALCIUM 8.3* 8.0*  --   MG  --  2.3  --   PHOS  --  7.1*  --     Liver Function Tests: Recent Labs  Lab 07/03/21 1623 07/04/21 0641  AST 15 11*  ALT 12 9  ALKPHOS 81 67  BILITOT 1.2 0.7  PROT 7.4 6.3*  ALBUMIN 3.4* 2.7*   No results for input(s): LIPASE, AMYLASE in the last 168 hours. No results for input(s): AMMONIA in the last 168 hours.  CBC: Recent Labs  Lab 07/03/21 1623 07/04/21 0641  WBC 9.4 7.5  NEUTROABS 8.1* 6.6  HGB 10.9* 9.2*  HCT 35.3* 29.4*  MCV 100.9* 102.4*  PLT 202 145*    Cardiac Enzymes: No results for input(s): CKTOTAL, CKMB, CKMBINDEX, TROPONINI  in the last 168 hours.  BNP: Invalid input(s): POCBNP  CBG: Recent Labs  Lab 07/04/21 0802 07/04/21 1136  GLUCAP 111* 199*    Microbiology: Results for orders placed or performed during the hospital encounter of 07/03/21  Resp Panel by RT-PCR (Flu A&B, Covid) Nasopharyngeal Swab     Status: Abnormal   Collection Time: 07/03/21  4:23 PM   Specimen: Nasopharyngeal Swab; Nasopharyngeal(NP) swabs in vial transport medium  Result Value Ref Range Status   SARS Coronavirus 2 by RT PCR POSITIVE (A) NEGATIVE Final    Comment: RESULT CALLED TO, READ BACK BY AND  VERIFIED WITH: MAGGIE BARBER @ 1741 ON 07/03/2021 BY CAF (NOTE) SARS-CoV-2 target nucleic acids are DETECTED.  The SARS-CoV-2 RNA is generally detectable in upper respiratory specimens during the acute phase of infection. Positive results are indicative of the presence of the identified virus, but do not rule out bacterial infection or co-infection with other pathogens not detected by the test. Clinical correlation with patient history and other diagnostic information is necessary to determine patient infection status. The expected result is Negative.  Fact Sheet for Patients: EntrepreneurPulse.com.au  Fact Sheet for Healthcare Providers: IncredibleEmployment.be  This test is not yet approved or cleared by the [place] FDA and  has been authorized for detection and/or diagnosis of SARS-CoV-2 by FDA under an Emergency Use Authorization (EUA).  This EUA will remain in effect (meaning this tes t can be used) for the duration of  the COVID-19 declaration under Section 564(b)(1) of the Act, 21 U.S.C. section 360bbb-3(b)(1), unless the authorization is terminated or revoked sooner.     Influenza A by PCR NEGATIVE NEGATIVE Final   Influenza B by PCR NEGATIVE NEGATIVE Final    Comment: (NOTE) The Xpert Xpress SARS-CoV-2/FLU/RSV plus assay is intended as an aid in the diagnosis of influenza from Nasopharyngeal swab specimens and should not be used as a sole basis for treatment. Nasal washings and aspirates are unacceptable for Xpert Xpress SARS-CoV-2/FLU/RSV testing.  Fact Sheet for Patients: EntrepreneurPulse.com.au  Fact Sheet for Healthcare Providers: IncredibleEmployment.be  This test is not yet approved or cleared by the [place] FDA and has been authorized for detection and/or diagnosis of SARS-CoV-2 by FDA under an Emergency Use Authorization (EUA). This EUA will remain in effect (meaning this  test can be used) for the duration of the COVID-19 declaration under Section 564(b)(1) of the Act, 21 U.S.C. section 360bbb-3(b)(1), unless the authorization is terminated or revoked.  Performed at [facility], [address] [place], [place] [address]   Culture, blood (Routine x 2)     Status: None (Preliminary result)   Collection Time: 07/03/21  4:23 PM   Specimen: BLOOD  Result Value Ref Range Status   Specimen Description BLOOD RIGHT ANTECUBITAL  Final   Special Requests   Final    BOTTLES DRAWN AEROBIC AND ANAEROBIC Blood Culture results may not be optimal due to an excessive volume of blood received in culture bottles   Culture   Final    NO GROWTH < 24 HOURS Performed at [facility], [address] [place], [place] [address]    Report Status PENDING  Incomplete  Culture, blood (Routine x 2)     Status: None (Preliminary result)   Collection Time: 07/03/21  4:23 PM   Specimen: BLOOD  Result Value Ref Range Status   Specimen Description BLOOD BLOOD RIGHT FOREARM  Final   Special Requests   Final    BOTTLES DRAWN AEROBIC  AND ANAEROBIC Blood Culture results may not be optimal due to an excessive volume of blood received in culture bottles   Culture   Final    NO GROWTH < 24 HOURS Performed at [facility], [address] [place],  [address]    Report Status PENDING  Incomplete    Coagulation Studies: Recent Labs    07/03/21 1623  LABPROT 15.0  INR 1.2    Urinalysis: No results for input(s): COLORURINE, LABSPEC, PHURINE, GLUCOSEU, HGBUR, BILIRUBINUR, KETONESUR, PROTEINUR, UROBILINOGEN, NITRITE, LEUKOCYTESUR in the last 72 hours.  Invalid input(s): APPERANCEUR    Imaging: US Venous Img Lower Unilateral Right (DVT)  Result Date: 07/04/2021 CLINICAL DATA:  Pain and swelling EXAM: Right LOWER EXTREMITY VENOUS DOPPLER ULTRASOUND TECHNIQUE: Gray-scale sonography with compression, as well as color and duplex ultrasound, were  performed to evaluate the deep venous system(s) from the level of the common femoral vein through the popliteal and proximal calf veins. COMPARISON:  None. FINDINGS: VENOUS Normal compressibility of the common femoral, superficial femoral, and popliteal veins, as well as the visualized calf veins. Visualized portions of profunda femoral vein and great saphenous vein unremarkable. No filling defects to suggest DVT on grayscale or color Doppler imaging. Doppler waveforms show normal direction of venous flow, normal respiratory plasticity and response to augmentation. Limited views of the contralateral common femoral vein are unremarkable. OTHER There are enlarged lymph nodes with fatty hilum in the right inguinal region. Limitations: none IMPRESSION: There are no signs of deep venous thrombosis in the right lower extremity. Electronically Signed   By: Elmer Picker M.D.   On: 07/04/2021 09:53   DG Chest Port 1 View  Result Date: 07/03/2021 CLINICAL DATA:  Question sepsis. EXAM: PORTABLE CHEST 1 VIEW COMPARISON:  12/12/2020 FINDINGS: Ill-defined airspace density in the left mid lung with mild progression. Progressive left lower lobe consolidation since the prior study. Small left effusion Progressive right lower lobe consolidation and right effusion. Heart size upper normal.  No definite heart failure. Stent in the left upper arm. IMPRESSION: Progressive bilateral airspace disease and bilateral effusions. This may be due to pneumonia. Heart failure considered less likely. Underlying mass lesion not excluded. Given the history of possible sepsis, this is likely pneumonia. Follow-up chest CT recommended if these areas do not show clearing. Electronically Signed   By: Franchot Gallo M.D.   On: 07/03/2021 16:49     Medications:    sodium chloride     sodium chloride     azithromycin Stopped (07/03/21 1747)   cefTRIAXone (ROCEPHIN)  IV Stopped (07/03/21 1711)   remdesivir 100 mg in NS 100 mL       acetaminophen  650 mg Oral Once   albuterol  2 puff Inhalation Q6H   vitamin C  500 mg Oral Daily   Chlorhexidine Gluconate Cloth  6 each Topical Q0600   cinacalcet  60 mg Oral Q breakfast   cloNIDine  0.3 mg Oral BID   dextromethorphan-guaiFENesin  1 tablet Oral BID   feeding supplement  237 mL Oral BID BM   heparin injection (subcutaneous)  5,000 Units Subcutaneous Q8H   insulin aspart  0-9 Units Subcutaneous TID WC   insulin aspart  3 Units Subcutaneous TID WC   methylPREDNISolone (SOLU-MEDROL) injection  0.5 mg/kg Intravenous Q12H   Followed by   Derrill Memo ON 07/07/2021] predniSONE  50 mg Oral Daily   metoprolol tartrate  100 mg Oral BID   pantoprazole  40 mg Oral Daily   sevelamer  carbonate  2,400 mg Oral TID WC   zinc sulfate  220 mg Oral Daily   sodium chloride, sodium chloride, acetaminophen, alteplase, chlorpheniramine-HYDROcodone, guaiFENesin-dextromethorphan, heparin, ipratropium-albuterol, lidocaine (PF), lidocaine-prilocaine, ondansetron **OR** ondansetron (ZOFRAN) IV, pentafluoroprop-tetrafluoroeth  Assessment/ Plan:  [name] is a 65 y.o.  male with past medical conditions including peripheral artery disease, hypertension, asthma, end-stage renal disease on hemodialysis. Patient admitted for Hypoxia [R09.02] Right leg swelling [M79.89] Fever, unspecified fever cause [R50.9] Community acquired pneumonia of right lung, unspecified part of lung [J18.9] COVID [U07.1] Pneumonia due to COVID-19 virus [U07.1, J12.82]  [facility] TTS [place]. AVF  End-stage renal disease on hemodialysis.  Will maintain outpatient schedule if possible.  Patient will receive dialysis this afternoon with no UF.  Due to positive COVID status, patient treatment will be completed in compliance with isolation protocols.  Next treatment scheduled for Tuesday.  2. Anemia of chronic kidney disease.  Lab Results  Component Value Date   HGB 9.2 (L) 07/04/2021  Hemoglobin just within  target, will monitor for need for ESA's.  3. Secondary Hyperparathyroidism:  Lab Results  Component Value Date   PTH 93 (H) 04/09/2019   CALCIUM 8.0 (L) 07/04/2021   PHOS 7.1 (H) 07/04/2021  Phosphorus elevated with corrected calcium of 9.0. Continue sevelamer with meals  4.  Hypertension with chronic kidney disease.  Home regimen includes amlodipine, enalapril, and metoprolol.  Currently receiving clonidine and metoprolol.  5.  COVID-19 pneumonia Found positive on 07/03/2021. Receiving remdesivir and dexamethasone. Antibiotic treatment with azithromycin and ceftriaxone.   LOS: 1   11/26/20222:16 PM

## 2021-07-04 NOTE — ED Notes (Signed)
Date and time results received: 07/04/21 7:58 AM  (use smartphrase ".now" to insert current time)  Test: K+ Critical Value: 6.3  Name of Provider Notified: Dr. Billie Ruddy  Orders Received? Or Actions Taken?: see chart

## 2021-07-04 NOTE — ED Notes (Signed)
Patient resting in bed with eyes closed. Resp even, unlabored on 2L per [place]. Awakens to verbal stimuli. Patient unable to produce urine for sample at this time. Specimen container given to patient for sputum sample. No distress noted at this time.

## 2021-07-04 NOTE — Progress Notes (Signed)
PROGRESS NOTE    Scott Castro  RVU:[id number] DOB: 15-Apr-1956 DOA: 07/03/2021 PCP: Franciso Bend, MD  [id number]   Assessment & Plan:   Principal Problem:   Pneumonia due to COVID-19 virus Active Problems:   COPD (chronic obstructive pulmonary disease) ([place])   Acute hypoxemic respiratory failure ([place])   ESRD (end stage renal disease) ([place])   Essential hypertension   CAP (community acquired pneumonia)   Hypoalbuminemia due to protein-calorie malnutrition ([place])   Macrocytic anemia   PAD (peripheral artery disease) ([facility])   GERD (gastroesophageal reflux disease)   Mixed hyperlipidemia   Scott Castro is a 65 y.o. male with medical history significant for hypertension, ESRD on HD, COPD/asthma due to secondhand smoke and peripheral arterial disease who presents to the emergency department due to 3-4 day onset of weakness, chest congestion, dry cough, fever and increased shortness of breath which worsens on ambulation.  Patient complained of worsening shortness of breath today, so he activated EMS.  On arrival of EMS team, he was noted to have an O2 sat of 87% on room air, this improved to 90% on supplemental oxygen at 2 LPM and 99% on submental oxygen via [place] at 4 LPM (patient does not use oxygen at baseline).  Last dialysis was on Wednesday (11/23)  Patient states that he already obtained to COVID-19 virus vaccination (Moderna), but did not receive the booster dose.   Acute respiratory failure with hypoxia 2/2 COVID-19 PNA with superimposed CAP  --Chest x-ray was suggestive of pneumonia --procal 2, CRP 15.3. Plan: --cont Remdesivir --cont ceftriaxone and azithro --cont steroid --cough meds PRN  Sepsis, POA --fever to 103.1, tachycardia, source PNA --tx as above  COPD/asthma --not on home daily bronchodilators  ESRD on HD TTS Last dialysis was on Wednesday (11/23) due to holiday schedule Plan: --dialysis today --Continue [name], Sensipar  Hyperkalemia --K+  increased from 5.0 to 6.3 this morning. --s/p insulin 5u + D50 and Veltessa --dialysis today  Hypoalbuminemia secondary to mild protein calorie malnutrition Albumin 3.4   RLE swelling --[name] neg for DVT  PAD Patient states that he follows with Dr. Ruby Cola (podiatry) Bilateral palpable pulses with Doppler ABI with TBI bilateral lower extremities ordered by podiatrist per medical record --outpatient follow-up with vascular surgery  GERD/history of Barrett's esophagus Continue Protonix  Essential hypertension Continue clonidine, Lopressor   DVT prophylaxis: Heparin SQ Code Status: Full code  Family Communication:  Level of care: Med-Surg Dispo:   The patient is from: home Anticipated d/c is to: home Anticipated d/c date is: 2-3 days Patient currently is not medically ready to d/c due to: IV Remdesivir and IV abx for PNA    Subjective and Interval History:  Pt's potassium was elevated this morning.  Temporized and plan for dialysis today.  Pt reported cough and breathing better today.  Already back on RA.   Objective: Vitals:   07/04/21 1700 07/04/21 1715 07/04/21 1730 07/04/21 1745  BP: (!) 127/55 (!) 127/57 132/63 (!) 133/56  Pulse: 61 (!) 59 64 64  Resp: (!) 22 (!) 21 20 (!) 21  Temp:      TempSrc:      SpO2:      Weight:      Height:        Intake/Output Summary (Last 24 hours) at 07/04/2021 1753 Last data filed at 07/04/2021 0009 Gross per 24 hour  Intake 250 ml  Output --  Net 250 ml   Filed Weights   07/03/21 1619 07/04/21  1525  Weight: 70 kg 71.6 kg    Examination:   Constitutional: NAD, AAOx3 HEENT: conjunctivae and lids normal, EOMI CV: No cyanosis.   RESP: normal respiratory effort though gurgling sounds, on RA Neuro: II - XII grossly intact.   Psych: flat mood and affect.  Appropriate judgement and reason   Data Reviewed: I have personally reviewed following labs and imaging studies  CBC: Recent Labs  Lab 07/03/21 1623  07/04/21 0641  WBC 9.4 7.5  NEUTROABS 8.1* 6.6  HGB 10.9* 9.2*  HCT 35.3* 29.4*  MCV 100.9* 102.4*  PLT 202 742*   Basic Metabolic Panel: Recent Labs  Lab 07/03/21 1623 07/04/21 0641 07/04/21 1235  NA 140 140  --   K 5.0 6.3* 5.4*  CL 100 102  --   CO2 27 27  --   GLUCOSE 115* 118*  --   BUN 49* 59*  --   CREATININE 9.56* 10.59*  --   CALCIUM 8.3* 8.0*  --   MG  --  2.3  --   PHOS  --  7.1*  --    GFR: Estimated Creatinine Clearance: 6.9 mL/min (A) (by C-G formula based on SCr of 10.59 mg/dL (H)). Liver Function Tests: Recent Labs  Lab 07/03/21 1623 07/04/21 0641  AST 15 11*  ALT 12 9  ALKPHOS 81 67  BILITOT 1.2 0.7  PROT 7.4 6.3*  ALBUMIN 3.4* 2.7*   No results for input(s): LIPASE, AMYLASE in the last 168 hours. No results for input(s): AMMONIA in the last 168 hours. Coagulation Profile: Recent Labs  Lab 07/03/21 1623  INR 1.2   Cardiac Enzymes: No results for input(s): CKTOTAL, CKMB, CKMBINDEX, TROPONINI in the last 168 hours. BNP (last 3 results) No results for input(s): PROBNP in the last 8760 hours. HbA1C: No results for input(s): HGBA1C in the last 72 hours. CBG: Recent Labs  Lab 07/04/21 0802 07/04/21 1136  GLUCAP 111* 199*   Lipid Profile: No results for input(s): CHOL, HDL, LDLCALC, TRIG, CHOLHDL, LDLDIRECT in the last 72 hours. Thyroid Function Tests: No results for input(s): TSH, T4TOTAL, FREET4, T3FREE, THYROIDAB in the last 72 hours. Anemia Panel: Recent Labs    07/04/21 0641  FERRITIN 874*   Sepsis Labs: Recent Labs  Lab 07/03/21 1623 07/03/21 1650  PROCALCITON  --  2.00  LATICACIDVEN 1.7  --     Recent Results (from the past 240 hour(s))  Resp Panel by RT-PCR (Flu A&B, Covid) Nasopharyngeal Swab     Status: Abnormal   Collection Time: 07/03/21  4:23 PM   Specimen: Nasopharyngeal Swab; Nasopharyngeal(NP) swabs in vial transport medium  Result Value Ref Range Status   SARS Coronavirus 2 by RT PCR POSITIVE (A) NEGATIVE  Final    Comment: RESULT CALLED TO, READ BACK BY AND VERIFIED WITH: MAGGIE BARBER @ 1741 ON 07/03/2021 BY CAF (NOTE) SARS-CoV-2 target nucleic acids are DETECTED.  The SARS-CoV-2 RNA is generally detectable in upper respiratory specimens during the acute phase of infection. Positive results are indicative of the presence of the identified virus, but do not rule out bacterial infection or co-infection with other pathogens not detected by the test. Clinical correlation with patient history and other diagnostic information is necessary to determine patient infection status. The expected result is Negative.  Fact Sheet for Patients: EntrepreneurPulse.com.au  Fact Sheet for Healthcare Providers: IncredibleEmployment.be  This test is not yet approved or cleared by the [place] FDA and  has been authorized for detection and/or diagnosis of SARS-CoV-2  by FDA under an Emergency Use Authorization (EUA).  This EUA will remain in effect (meaning this tes t can be used) for the duration of  the COVID-19 declaration under Section 564(b)(1) of the Act, 21 U.S.C. section 360bbb-3(b)(1), unless the authorization is terminated or revoked sooner.     Influenza A by PCR NEGATIVE NEGATIVE Final   Influenza B by PCR NEGATIVE NEGATIVE Final    Comment: (NOTE) The Xpert Xpress SARS-CoV-2/FLU/RSV plus assay is intended as an aid in the diagnosis of influenza from Nasopharyngeal swab specimens and should not be used as a sole basis for treatment. Nasal washings and aspirates are unacceptable for Xpert Xpress SARS-CoV-2/FLU/RSV testing.  Fact Sheet for Patients: EntrepreneurPulse.com.au  Fact Sheet for Healthcare Providers: IncredibleEmployment.be  This test is not yet approved or cleared by the [place] FDA and has been authorized for detection and/or diagnosis of SARS-CoV-2 by FDA under an Emergency Use Authorization  (EUA). This EUA will remain in effect (meaning this test can be used) for the duration of the COVID-19 declaration under Section 564(b)(1) of the Act, 21 U.S.C. section 360bbb-3(b)(1), unless the authorization is terminated or revoked.  Performed at [facility], [address] [place], [place] [address]   Culture, blood (Routine x 2)     Status: None (Preliminary result)   Collection Time: 07/03/21  4:23 PM   Specimen: BLOOD  Result Value Ref Range Status   Specimen Description BLOOD RIGHT ANTECUBITAL  Final   Special Requests   Final    BOTTLES DRAWN AEROBIC AND ANAEROBIC Blood Culture results may not be optimal due to an excessive volume of blood received in culture bottles   Culture   Final    NO GROWTH < 24 HOURS Performed at [facility], [address], [place], [place] [address]    Report Status PENDING  Incomplete  Culture, blood (Routine x 2)     Status: None (Preliminary result)   Collection Time: 07/03/21  4:23 PM   Specimen: BLOOD  Result Value Ref Range Status   Specimen Description BLOOD BLOOD RIGHT FOREARM  Final   Special Requests   Final    BOTTLES DRAWN AEROBIC AND ANAEROBIC Blood Culture results may not be optimal due to an excessive volume of blood received in culture bottles   Culture   Final    NO GROWTH < 24 HOURS Performed at [facility], [address], [place], [place] [address]    Report Status PENDING  Incomplete      Radiology Studies: US Venous Img Lower Unilateral Right (DVT)  Result Date: 07/04/2021 CLINICAL DATA:  Pain and swelling EXAM: Right LOWER EXTREMITY VENOUS DOPPLER ULTRASOUND TECHNIQUE: Gray-scale sonography with compression, as well as color and duplex ultrasound, were performed to evaluate the deep venous system(s) from the level of the common femoral vein through the popliteal and proximal calf veins. COMPARISON:  None. FINDINGS: VENOUS Normal compressibility of the common femoral, superficial  femoral, and popliteal veins, as well as the visualized calf veins. Visualized portions of profunda femoral vein and great saphenous vein unremarkable. No filling defects to suggest DVT on grayscale or color Doppler imaging. Doppler waveforms show normal direction of venous flow, normal respiratory plasticity and response to augmentation. Limited views of the contralateral common femoral vein are unremarkable. OTHER There are enlarged lymph nodes with fatty hilum in the right inguinal region. Limitations: none IMPRESSION: There are no signs of deep venous thrombosis in the right lower extremity. Electronically Signed  By: Elmer Picker M.D.   On: 07/04/2021 09:53   DG Chest Port 1 View  Result Date: 07/03/2021 CLINICAL DATA:  Question sepsis. EXAM: PORTABLE CHEST 1 VIEW COMPARISON:  12/12/2020 FINDINGS: Ill-defined airspace density in the left mid lung with mild progression. Progressive left lower lobe consolidation since the prior study. Small left effusion Progressive right lower lobe consolidation and right effusion. Heart size upper normal.  No definite heart failure. Stent in the left upper arm. IMPRESSION: Progressive bilateral airspace disease and bilateral effusions. This may be due to pneumonia. Heart failure considered less likely. Underlying mass lesion not excluded. Given the history of possible sepsis, this is likely pneumonia. Follow-up chest CT recommended if these areas do not show clearing. Electronically Signed   By: Franchot Gallo M.D.   On: 07/03/2021 16:49     Scheduled Meds:  acetaminophen  650 mg Oral Once   albuterol  2 puff Inhalation Q6H   vitamin C  500 mg Oral Daily   Chlorhexidine Gluconate Cloth  6 each Topical Q0600   cinacalcet  60 mg Oral Q breakfast   cloNIDine  0.3 mg Oral BID   dextromethorphan-guaiFENesin  1 tablet Oral BID   feeding supplement  237 mL Oral BID BM   heparin injection (subcutaneous)  5,000 Units Subcutaneous Q8H   insulin aspart  0-9 Units  Subcutaneous TID WC   insulin aspart  3 Units Subcutaneous TID WC   methylPREDNISolone (SOLU-MEDROL) injection  0.5 mg/kg Intravenous Q12H   Followed by   Derrill Memo ON 07/07/2021] predniSONE  50 mg Oral Daily   metoprolol tartrate  100 mg Oral BID   pantoprazole  40 mg Oral Daily   sevelamer carbonate  2,400 mg Oral TID WC   zinc sulfate  220 mg Oral Daily   Continuous Infusions:  sodium chloride     sodium chloride     azithromycin Stopped (07/03/21 1747)   cefTRIAXone (ROCEPHIN)  IV Stopped (07/03/21 1711)   remdesivir 100 mg in NS 100 mL       LOS: 1 day     Enzo Bi, MD [facility] If 7PM-7AM, please contact night-coverage 07/04/2021, 5:53 PM

## 2021-07-05 DIAGNOSIS — J1282 Pneumonia due to coronavirus disease 2019: Secondary | ICD-10-CM | POA: Diagnosis not present

## 2021-07-05 DIAGNOSIS — U071 COVID-19: Secondary | ICD-10-CM | POA: Diagnosis not present

## 2021-07-05 LAB — HEPATITIS B SURFACE ANTIBODY, QUANTITATIVE: Hep B S AB Quant (Post): 195.8 m[IU]/mL (ref >9.9)

## 2021-07-05 LAB — GLUCOSE, CAPILLARY
Glucose-Capillary: 110 mg/dL — ABNORMAL HIGH (ref 70–99)
Glucose-Capillary: 239 mg/dL — ABNORMAL HIGH (ref 70–99)
Glucose-Capillary: 150 mg/dL — ABNORMAL HIGH (ref 70–99)
Glucose-Capillary: 137 mg/dL — ABNORMAL HIGH (ref 70–99)

## 2021-07-05 LAB — CBC
HCT: 33.9 % — ABNORMAL LOW (ref 39.0–52.0)
Hemoglobin: 10.9 g/dL — ABNORMAL LOW (ref 13.0–17.0)
MCH: 32 pg (ref 26.0–34.0)
MCHC: 32.2 g/dL (ref 30.0–36.0)
MCV: 99.4 fL (ref 80.0–100.0)
Platelets: 221 10*3/uL (ref 150–400)
RBC: 3.41 MIL/uL — ABNORMAL LOW (ref 4.22–5.81)
RDW: 14.2 % (ref 11.5–15.5)
WBC: 9.7 10*3/uL (ref 4.0–10.5)
nRBC: 0 % (ref 0.0–0.2)

## 2021-07-05 LAB — BASIC METABOLIC PANEL
Anion gap: 15 (ref 5–15)
BUN: 51 mg/dL — ABNORMAL HIGH (ref 8–23)
CO2: 25 mmol/L (ref 22–32)
Calcium: 8.8 mg/dL — ABNORMAL LOW (ref 8.9–10.3)
Chloride: 95 mmol/L — ABNORMAL LOW (ref 98–111)
Creatinine, Ser: 7.37 mg/dL — ABNORMAL HIGH (ref 0.61–1.24)
GFR, Estimated: 8 mL/min — ABNORMAL LOW (ref >60)
Glucose, Bld: 130 mg/dL — ABNORMAL HIGH (ref 70–99)
Potassium: 4.3 mmol/L (ref 3.5–5.1)
Sodium: 135 mmol/L (ref 135–145)

## 2021-07-05 LAB — MAGNESIUM: Magnesium: 2 mg/dL (ref 1.7–2.4)

## 2021-07-05 MED ORDER — AZITHROMYCIN 500 MG PO TABS
500.0000 mg | ORAL_TABLET | Freq: Every day | ORAL | Status: AC
  Administered 2021-07-06 – 2021-07-08 (×3): 500 mg via ORAL
  Filled 2021-07-05 (×3): qty 1

## 2021-07-05 MED ORDER — SODIUM CHLORIDE 0.9 % IV SOLN
2.0000 g | INTRAVENOUS | Status: DC
  Administered 2021-07-05 – 2021-07-07 (×3): 2 g via INTRAVENOUS
  Filled 2021-07-05: qty 2
  Filled 2021-07-05: qty 20
  Filled 2021-07-05: qty 2
  Filled 2021-07-05: qty 20

## 2021-07-05 MED ORDER — AZITHROMYCIN 500 MG PO TABS
500.0000 mg | ORAL_TABLET | Freq: Every day | ORAL | Status: DC
  Administered 2021-07-05: 09:00:00 500 mg via ORAL
  Filled 2021-07-05: qty 1

## 2021-07-05 MED ORDER — DEXAMETHASONE 6 MG PO TABS
6.0000 mg | ORAL_TABLET | Freq: Every day | ORAL | Status: DC
  Administered 2021-07-05 – 2021-07-08 (×4): 6 mg via ORAL
  Filled 2021-07-05 (×4): qty 1

## 2021-07-05 NOTE — Progress Notes (Signed)
[facility]  ROUNDING NOTE   Subjective:   Scott Castro is a 65 y.o. male with past medical conditions including peripheral artery disease, hypertension, asthma, end-stage renal disease on hemodialysis.  Patient presents to the emergency room with complaints of weakness, chest congestion, dry cough and fever with increased shortness of breath for 3 to 4 days.  Patient was found to be COVID-positive with pneumonia and was admitted for Hypoxia [R09.02] Right leg swelling [M79.89] Fever, unspecified fever cause [R50.9] Community acquired pneumonia of right lung, unspecified part of lung [J18.9] COVID [U07.1] Pneumonia due to COVID-19 virus [U07.1, J12.82]  Patient is known to our practice from multiple hospital admissions.  He currently receives outpatient dialysis treatments at [facility] on a TTS schedule.  He is supervised by [facility] nephrology.    Patient seen resting well  Denies shortness of breath Tolerating meals  Objective:  Vital signs in last 24 hours:  Temp:  [97.8 F (36.6 C)-98.3 F (36.8 C)] 97.8 F (36.6 C) (11/27 1218) Pulse Rate:  [59-100] 80 (11/27 1218) Resp:  [16-23] 18 (11/27 1218) BP: (100-144)/(44-88) 113/45 (11/27 1218) SpO2:  [95 %-99 %] 95 % (11/27 1218) Weight:  [70.3 kg-71.6 kg] 70.3 kg (11/26 1835)  Weight change: 1.6 kg Filed Weights   07/03/21 1619 07/04/21 1525 07/04/21 1835  Weight: 70 kg 71.6 kg 70.3 kg    Intake/Output: I/O last 3 completed shifts: In: 929.2 [P.O.:240; IV Piggyback:689.2] Out: 0    Intake/Output this shift:  No intake/output data recorded.  Physical Exam: General: NAD, resting in bed  Head: Normocephalic, atraumatic. Moist oral mucosal membranes  Eyes: Anicteric  Lungs:  Diminished, normal effort  Heart: Regular rate and rhythm  Abdomen:  Soft, nontender  Extremities: No peripheral edema.  Neurologic: Nonfocal, moving all four extremities  Skin: No lesions  Access: Left aVF    Basic Metabolic  Panel: Recent Labs  Lab 07/03/21 1623 07/04/21 0641 07/04/21 1235 07/05/21 0611  NA 140 140  --  135  K 5.0 6.3* 5.4* 4.3  CL 100 102  --  95*  CO2 27 27  --  25  GLUCOSE 115* 118*  --  130*  BUN 49* 59*  --  51*  CREATININE 9.56* 10.59*  --  7.37*  CALCIUM 8.3* 8.0*  --  8.8*  MG  --  2.3  --  2.0  PHOS  --  7.1*  --   --      Liver Function Tests: Recent Labs  Lab 07/03/21 1623 07/04/21 0641  AST 15 11*  ALT 12 9  ALKPHOS 81 67  BILITOT 1.2 0.7  PROT 7.4 6.3*  ALBUMIN 3.4* 2.7*    No results for input(s): LIPASE, AMYLASE in the last 168 hours. No results for input(s): AMMONIA in the last 168 hours.  CBC: Recent Labs  Lab 07/03/21 1623 07/04/21 0641 07/05/21 0611  WBC 9.4 7.5 9.7  NEUTROABS 8.1* 6.6  --   HGB 10.9* 9.2* 10.9*  HCT 35.3* 29.4* 33.9*  MCV 100.9* 102.4* 99.4  PLT 202 145* 221     Cardiac Enzymes: No results for input(s): CKTOTAL, CKMB, CKMBINDEX, TROPONINI in the last 168 hours.  BNP: Invalid input(s): POCBNP  CBG: Recent Labs  Lab 07/04/21 0802 07/04/21 1136 07/04/21 2239 07/05/21 0837 07/05/21 1146  GLUCAP 111* 199* 184* 110* 50*     Microbiology: Results for orders placed or performed during the hospital encounter of 07/03/21  Resp Panel by RT-PCR (Flu A&B, Covid)  Nasopharyngeal Swab     Status: Abnormal   Collection Time: 07/03/21  4:23 PM   Specimen: Nasopharyngeal Swab; Nasopharyngeal(NP) swabs in vial transport medium  Result Value Ref Range Status   SARS Coronavirus 2 by RT PCR POSITIVE (A) NEGATIVE Final    Comment: RESULT CALLED TO, READ BACK BY AND VERIFIED WITH: MAGGIE BARBER @ 1741 ON 07/03/2021 BY CAF (NOTE) SARS-CoV-2 target nucleic acids are DETECTED.  The SARS-CoV-2 RNA is generally detectable in upper respiratory specimens during the acute phase of infection. Positive results are indicative of the presence of the identified virus, but do not rule out bacterial infection or co-infection with other  pathogens not detected by the test. Clinical correlation with patient history and other diagnostic information is necessary to determine patient infection status. The expected result is Negative.  Fact Sheet for Patients: EntrepreneurPulse.com.au  Fact Sheet for Healthcare Providers: IncredibleEmployment.be  This test is not yet approved or cleared by the [place] FDA and  has been authorized for detection and/or diagnosis of SARS-CoV-2 by FDA under an Emergency Use Authorization (EUA).  This EUA will remain in effect (meaning this tes t can be used) for the duration of  the COVID-19 declaration under Section 564(b)(1) of the Act, 21 U.S.C. section 360bbb-3(b)(1), unless the authorization is terminated or revoked sooner.     Influenza A by PCR NEGATIVE NEGATIVE Final   Influenza B by PCR NEGATIVE NEGATIVE Final    Comment: (NOTE) The Xpert Xpress SARS-CoV-2/FLU/RSV plus assay is intended as an aid in the diagnosis of influenza from Nasopharyngeal swab specimens and should not be used as a sole basis for treatment. Nasal washings and aspirates are unacceptable for Xpert Xpress SARS-CoV-2/FLU/RSV testing.  Fact Sheet for Patients: EntrepreneurPulse.com.au  Fact Sheet for Healthcare Providers: IncredibleEmployment.be  This test is not yet approved or cleared by the [place] FDA and has been authorized for detection and/or diagnosis of SARS-CoV-2 by FDA under an Emergency Use Authorization (EUA). This EUA will remain in effect (meaning this test can be used) for the duration of the COVID-19 declaration under Section 564(b)(1) of the Act, 21 U.S.C. section 360bbb-3(b)(1), unless the authorization is terminated or revoked.  Performed at [facility], [address] [place], [place] [address]   Culture, blood (Routine x 2)     Status: None (Preliminary result)   Collection Time: 07/03/21   4:23 PM   Specimen: BLOOD  Result Value Ref Range Status   Specimen Description BLOOD RIGHT ANTECUBITAL  Final   Special Requests   Final    BOTTLES DRAWN AEROBIC AND ANAEROBIC Blood Culture results may not be optimal due to an excessive volume of blood received in culture bottles   Culture   Final    NO GROWTH 2 DAYS Performed at [facility], [address], [place], [place] [address]    Report Status PENDING  Incomplete  Culture, blood (Routine x 2)     Status: None (Preliminary result)   Collection Time: 07/03/21  4:23 PM   Specimen: BLOOD  Result Value Ref Range Status   Specimen Description BLOOD BLOOD RIGHT FOREARM  Final   Special Requests   Final    BOTTLES DRAWN AEROBIC AND ANAEROBIC Blood Culture results may not be optimal due to an excessive volume of blood received in culture bottles   Culture   Final    NO GROWTH 2 DAYS Performed at [facility], [address], [place], [place] [address]    Report  Status PENDING  Incomplete    Coagulation Studies: Recent Labs    07/03/21 1623  LABPROT 15.0  INR 1.2     Urinalysis: No results for input(s): COLORURINE, LABSPEC, PHURINE, GLUCOSEU, HGBUR, BILIRUBINUR, KETONESUR, PROTEINUR, UROBILINOGEN, NITRITE, LEUKOCYTESUR in the last 72 hours.  Invalid input(s): APPERANCEUR    Imaging: US Venous Img Lower Unilateral Right (DVT)  Result Date: 07/04/2021 CLINICAL DATA:  Pain and swelling EXAM: Right LOWER EXTREMITY VENOUS DOPPLER ULTRASOUND TECHNIQUE: Gray-scale sonography with compression, as well as color and duplex ultrasound, were performed to evaluate the deep venous system(s) from the level of the common femoral vein through the popliteal and proximal calf veins. COMPARISON:  None. FINDINGS: VENOUS Normal compressibility of the common femoral, superficial femoral, and popliteal veins, as well as the visualized calf veins. Visualized portions of profunda femoral vein and great saphenous vein  unremarkable. No filling defects to suggest DVT on grayscale or color Doppler imaging. Doppler waveforms show normal direction of venous flow, normal respiratory plasticity and response to augmentation. Limited views of the contralateral common femoral vein are unremarkable. OTHER There are enlarged lymph nodes with fatty hilum in the right inguinal region. Limitations: none IMPRESSION: There are no signs of deep venous thrombosis in the right lower extremity. Electronically Signed   By: Elmer Picker M.D.   On: 07/04/2021 09:53   DG Chest Port 1 View  Result Date: 07/03/2021 CLINICAL DATA:  Question sepsis. EXAM: PORTABLE CHEST 1 VIEW COMPARISON:  12/12/2020 FINDINGS: Ill-defined airspace density in the left mid lung with mild progression. Progressive left lower lobe consolidation since the prior study. Small left effusion Progressive right lower lobe consolidation and right effusion. Heart size upper normal.  No definite heart failure. Stent in the left upper arm. IMPRESSION: Progressive bilateral airspace disease and bilateral effusions. This may be due to pneumonia. Heart failure considered less likely. Underlying mass lesion not excluded. Given the history of possible sepsis, this is likely pneumonia. Follow-up chest CT recommended if these areas do not show clearing. Electronically Signed   By: Franchot Gallo M.D.   On: 07/03/2021 16:49     Medications:    cefTRIAXone (ROCEPHIN)  IV 2 g (07/05/21 1149)   remdesivir 100 mg in NS 100 mL 100 mg (07/05/21 1249)    albuterol  2 puff Inhalation Q6H   vitamin C  500 mg Oral Daily   [START ON 07/06/2021] azithromycin  500 mg Oral Daily   Chlorhexidine Gluconate Cloth  6 each Topical Q0600   cinacalcet  60 mg Oral Q breakfast   cloNIDine  0.3 mg Oral BID   dextromethorphan-guaiFENesin  1 tablet Oral BID   heparin injection (subcutaneous)  5,000 Units Subcutaneous Q8H   insulin aspart  0-9 Units Subcutaneous TID WC   insulin aspart  3 Units  Subcutaneous TID WC   methylPREDNISolone (SOLU-MEDROL) injection  0.5 mg/kg Intravenous Q12H   Followed by   Derrill Memo ON 07/07/2021] predniSONE  50 mg Oral Daily   metoprolol tartrate  100 mg Oral BID   pantoprazole  40 mg Oral Daily   sevelamer carbonate  2,400 mg Oral TID WC   zinc sulfate  220 mg Oral Daily   acetaminophen, chlorpheniramine-HYDROcodone, guaiFENesin-dextromethorphan, ipratropium-albuterol, ondansetron **OR** ondansetron (ZOFRAN) IV  Assessment/ Plan:  Mr. GURJOT BRISCO is a 65 y.o.  male with past medical conditions including peripheral artery disease, hypertension, asthma, end-stage renal disease on hemodialysis. Patient admitted for Hypoxia [R09.02] Right leg swelling [M79.89] Fever, unspecified fever cause [R50.9] Community  acquired pneumonia of right lung, unspecified part of lung [J18.9] COVID [U07.1] Pneumonia due to COVID-19 virus [U07.1, J12.82]  [facility] TTS [place]. [facility]  End-stage renal disease on hemodialysis.  Will maintain outpatient schedule if possible.  Received dialysis yesterday, no UF. Next treatment scheduled for Tuesday  2. Anemia of chronic kidney disease.  Lab Results  Component Value Date   HGB 10.9 (L) 07/05/2021  Will continue monitor  3. Secondary Hyperparathyroidism:  Lab Results  Component Value Date   PTH 93 (H) 04/09/2019   CALCIUM 8.8 (L) 07/05/2021   PHOS 7.1 (H) 07/04/2021   Continue sevelamer with meals  4.  Hypertension with chronic kidney disease.  Home regimen includes amlodipine, enalapril, and metoprolol.  Currently receiving clonidine and metoprolol.  BP controlled for this patient  5.  COVID-19 pneumonia Found positive on 07/03/2021. Remdesivir and supportive therapies ordered Antibiotic treatment with azithromycin and ceftriaxone.   LOS: 2   11/27/20221:40 PM

## 2021-07-05 NOTE — Progress Notes (Signed)
PROGRESS NOTE    Scott Castro  WUG:[id number] DOB: October 06, 1955 DOA: 07/03/2021 PCP: Franciso Bend, MD  [id number]/[id number]-AA   Assessment & Plan:   Principal Problem:   Pneumonia due to COVID-19 virus Active Problems:   COPD (chronic obstructive pulmonary disease) ([place])   Acute hypoxemic respiratory failure ([place])   ESRD (end stage renal disease) ([place])   Essential hypertension   CAP (community acquired pneumonia)   Hypoalbuminemia due to protein-calorie malnutrition ([place])   Macrocytic anemia   PAD (peripheral artery disease) ([facility])   GERD (gastroesophageal reflux disease)   Mixed hyperlipidemia   Scott Castro is a 65 y.o. male with medical history significant for hypertension, ESRD on HD, COPD/asthma due to secondhand smoke and peripheral arterial disease who presents to the emergency department due to 3-4 day onset of weakness, chest congestion, dry cough, fever and increased shortness of breath which worsens on ambulation.  Patient complained of worsening shortness of breath today, so he activated EMS.  On arrival of EMS team, he was noted to have an O2 sat of 87% on room air, this improved to 90% on supplemental oxygen at 2 LPM and 99% on submental oxygen via [place] at 4 LPM (patient does not use oxygen at baseline).  Last dialysis was on Wednesday (11/23)  Patient states that he already obtained to COVID-19 virus vaccination (Moderna), but did not receive the booster dose.   Acute respiratory failure with hypoxia 2/2 COVID-19 PNA with superimposed RLL CAP  --Chest x-ray with finding of RLL consolidation. --procal 2, CRP 15.3. --Pt was not given his day 2 of abx due to being away at dialysis. Plan: --cont Remdesivir --cont ceftriaxone and azithro --switch steroid to oral decadron today --cough meds PRN  Sepsis, POA --fever to 103.1, tachycardia, source PNA --tx as above  COPD/asthma --not on home daily bronchodilators  ESRD on HD TTS Last dialysis was on Wednesday (11/23) due to  holiday schedule Plan: --iHD per nephro --Continue [name], Sensipar  Hyperkalemia, resolved --K+ increased from 5.0 to 6.3 morning of 11/26. --s/p insulin 5u + D50 and Veltessa --iHD for potassium removal  PAD Patient states that he follows with Dr. Ruby Cola (podiatry) Bilateral palpable pulses with Doppler ABI with TBI bilateral lower extremities ordered by podiatrist per medical record --outpatient follow-up with vascular surgery  GERD/history of Barrett's esophagus Continue Protonix  Essential hypertension --cont home Lopressor --d/c clonidine, since pt has been refusing   DVT prophylaxis: Heparin SQ Code Status: Full code  Family Communication:  Level of care: Med-Surg Dispo:   The patient is from: home Anticipated d/c is to: home Anticipated d/c date is: 2-3 days Patient currently is not medically ready to d/c due to: IV Remdesivir and IV abx for PNA   Subjective and Interval History:  Pt reported eating better.  Dyspnea and cough improved.     Objective: Vitals:   07/05/21 0340 07/05/21 0748 07/05/21 1142 07/05/21 1218  BP: 127/77 123/88 115/74 (!) 113/45  Pulse: 98 96 95 80  Resp: 18 16 16 18   Temp: 98 F (36.7 C) 98.2 F (36.8 C) 98.1 F (36.7 C) 97.8 F (36.6 C)  TempSrc: Oral  Oral   SpO2: 95% 96% 95% 95%  Weight:      Height:        Intake/Output Summary (Last 24 hours) at 07/05/2021 1538 Last data filed at 07/05/2021 0022 Gross per 24 hour  Intake 679.23 ml  Output 0 ml  Net 679.23 ml   Danley Danker  Weights   07/03/21 1619 07/04/21 1525 07/04/21 1835  Weight: 70 kg 71.6 kg 70.3 kg    Examination:   Constitutional: NAD, AAOx3 HEENT: conjunctivae and lids normal, EOMI CV: No cyanosis.   RESP: normal respiratory effort, on RA Extremities: No effusions, edema in BLE SKIN: warm, dry Neuro: II - XII grossly intact.   Psych: Normal mood and affect.  Appropriate judgement and reason   Data Reviewed: I have personally reviewed following  labs and imaging studies  CBC: Recent Labs  Lab 07/03/21 1623 07/04/21 0641 07/05/21 0611  WBC 9.4 7.5 9.7  NEUTROABS 8.1* 6.6  --   HGB 10.9* 9.2* 10.9*  HCT 35.3* 29.4* 33.9*  MCV 100.9* 102.4* 99.4  PLT 202 145* 400   Basic Metabolic Panel: Recent Labs  Lab 07/03/21 1623 07/04/21 0641 07/04/21 1235 07/05/21 0611  NA 140 140  --  135  K 5.0 6.3* 5.4* 4.3  CL 100 102  --  95*  CO2 27 27  --  25  GLUCOSE 115* 118*  --  130*  BUN 49* 59*  --  51*  CREATININE 9.56* 10.59*  --  7.37*  CALCIUM 8.3* 8.0*  --  8.8*  MG  --  2.3  --  2.0  PHOS  --  7.1*  --   --    GFR: Estimated Creatinine Clearance: 9.8 mL/min (A) (by C-G formula based on SCr of 7.37 mg/dL (H)). Liver Function Tests: Recent Labs  Lab 07/03/21 1623 07/04/21 0641  AST 15 11*  ALT 12 9  ALKPHOS 81 67  BILITOT 1.2 0.7  PROT 7.4 6.3*  ALBUMIN 3.4* 2.7*   No results for input(s): LIPASE, AMYLASE in the last 168 hours. No results for input(s): AMMONIA in the last 168 hours. Coagulation Profile: Recent Labs  Lab 07/03/21 1623  INR 1.2   Cardiac Enzymes: No results for input(s): CKTOTAL, CKMB, CKMBINDEX, TROPONINI in the last 168 hours. BNP (last 3 results) No results for input(s): PROBNP in the last 8760 hours. HbA1C: No results for input(s): HGBA1C in the last 72 hours. CBG: Recent Labs  Lab 07/04/21 0802 07/04/21 1136 07/04/21 2239 07/05/21 0837 07/05/21 1146  GLUCAP 111* 199* 184* 110* 239*   Lipid Profile: No results for input(s): CHOL, HDL, LDLCALC, TRIG, CHOLHDL, LDLDIRECT in the last 72 hours. Thyroid Function Tests: No results for input(s): TSH, T4TOTAL, FREET4, T3FREE, THYROIDAB in the last 72 hours. Anemia Panel: Recent Labs    07/04/21 0641  FERRITIN 874*   Sepsis Labs: Recent Labs  Lab 07/03/21 1623 07/03/21 1650  PROCALCITON  --  2.00  LATICACIDVEN 1.7  --     Recent Results (from the past 240 hour(s))  Resp Panel by RT-PCR (Flu A&B, Covid) Nasopharyngeal Swab      Status: Abnormal   Collection Time: 07/03/21  4:23 PM   Specimen: Nasopharyngeal Swab; Nasopharyngeal(NP) swabs in vial transport medium  Result Value Ref Range Status   SARS Coronavirus 2 by RT PCR POSITIVE (A) NEGATIVE Final    Comment: RESULT CALLED TO, READ BACK BY AND VERIFIED WITH: MAGGIE BARBER @ 1741 ON 07/03/2021 BY CAF (NOTE) SARS-CoV-2 target nucleic acids are DETECTED.  The SARS-CoV-2 RNA is generally detectable in upper respiratory specimens during the acute phase of infection. Positive results are indicative of the presence of the identified virus, but do not rule out bacterial infection or co-infection with other pathogens not detected by the test. Clinical correlation with patient history and other diagnostic information is  necessary to determine patient infection status. The expected result is Negative.  Fact Sheet for Patients: EntrepreneurPulse.com.au  Fact Sheet for Healthcare Providers: IncredibleEmployment.be  This test is not yet approved or cleared by the [place] FDA and  has been authorized for detection and/or diagnosis of SARS-CoV-2 by FDA under an Emergency Use Authorization (EUA).  This EUA will remain in effect (meaning this tes t can be used) for the duration of  the COVID-19 declaration under Section 564(b)(1) of the Act, 21 U.S.C. section 360bbb-3(b)(1), unless the authorization is terminated or revoked sooner.     Influenza A by PCR NEGATIVE NEGATIVE Final   Influenza B by PCR NEGATIVE NEGATIVE Final    Comment: (NOTE) The Xpert Xpress SARS-CoV-2/FLU/RSV plus assay is intended as an aid in the diagnosis of influenza from Nasopharyngeal swab specimens and should not be used as a sole basis for treatment. Nasal washings and aspirates are unacceptable for Xpert Xpress SARS-CoV-2/FLU/RSV testing.  Fact Sheet for Patients: EntrepreneurPulse.com.au  Fact Sheet for Healthcare  Providers: IncredibleEmployment.be  This test is not yet approved or cleared by the [place] FDA and has been authorized for detection and/or diagnosis of SARS-CoV-2 by FDA under an Emergency Use Authorization (EUA). This EUA will remain in effect (meaning this test can be used) for the duration of the COVID-19 declaration under Section 564(b)(1) of the Act, 21 U.S.C. section 360bbb-3(b)(1), unless the authorization is terminated or revoked.  Performed at [facility], [address] [place], [place] [address]   Culture, blood (Routine x 2)     Status: None (Preliminary result)   Collection Time: 07/03/21  4:23 PM   Specimen: BLOOD  Result Value Ref Range Status   Specimen Description BLOOD RIGHT ANTECUBITAL  Final   Special Requests   Final    BOTTLES DRAWN AEROBIC AND ANAEROBIC Blood Culture results may not be optimal due to an excessive volume of blood received in culture bottles   Culture   Final    NO GROWTH 2 DAYS Performed at [name], [address], [place], [place] [address]    Report Status PENDING  Incomplete  Culture, blood (Routine x 2)     Status: None (Preliminary result)   Collection Time: 07/03/21  4:23 PM   Specimen: BLOOD  Result Value Ref Range Status   Specimen Description BLOOD BLOOD RIGHT FOREARM  Final   Special Requests   Final    BOTTLES DRAWN AEROBIC AND ANAEROBIC Blood Culture results may not be optimal due to an excessive volume of blood received in culture bottles   Culture   Final    NO GROWTH 2 DAYS Performed at [facility], [address] [place],  [address]    Report Status PENDING  Incomplete      Radiology Studies: US Venous Img Lower Unilateral Right (DVT)  Result Date: 07/04/2021 CLINICAL DATA:  Pain and swelling EXAM: Right LOWER EXTREMITY VENOUS DOPPLER ULTRASOUND TECHNIQUE: Gray-scale sonography with compression, as well as color and duplex ultrasound, were  performed to evaluate the deep venous system(s) from the level of the common femoral vein through the popliteal and proximal calf veins. COMPARISON:  None. FINDINGS: VENOUS Normal compressibility of the common femoral, superficial femoral, and popliteal veins, as well as the visualized calf veins. Visualized portions of profunda femoral vein and great saphenous vein unremarkable. No filling defects to suggest DVT on grayscale or color Doppler imaging. Doppler waveforms show normal direction of venous flow, normal respiratory plasticity and  response to augmentation. Limited views of the contralateral common femoral vein are unremarkable. OTHER There are enlarged lymph nodes with fatty hilum in the right inguinal region. Limitations: none IMPRESSION: There are no signs of deep venous thrombosis in the right lower extremity. Electronically Signed   By: Elmer Picker M.D.   On: 07/04/2021 09:53   DG Chest Port 1 View  Result Date: 07/03/2021 CLINICAL DATA:  Question sepsis. EXAM: PORTABLE CHEST 1 VIEW COMPARISON:  12/12/2020 FINDINGS: Ill-defined airspace density in the left mid lung with mild progression. Progressive left lower lobe consolidation since the prior study. Small left effusion Progressive right lower lobe consolidation and right effusion. Heart size upper normal.  No definite heart failure. Stent in the left upper arm. IMPRESSION: Progressive bilateral airspace disease and bilateral effusions. This may be due to pneumonia. Heart failure considered less likely. Underlying mass lesion not excluded. Given the history of possible sepsis, this is likely pneumonia. Follow-up chest CT recommended if these areas do not show clearing. Electronically Signed   By: Franchot Gallo M.D.   On: 07/03/2021 16:49     Scheduled Meds:  albuterol  2 puff Inhalation Q6H   vitamin C  500 mg Oral Daily   [START ON 07/06/2021] azithromycin  500 mg Oral Daily   Chlorhexidine Gluconate Cloth  6 each Topical Q0600    cinacalcet  60 mg Oral Q breakfast   cloNIDine  0.3 mg Oral BID   dextromethorphan-guaiFENesin  1 tablet Oral BID   heparin injection (subcutaneous)  5,000 Units Subcutaneous Q8H   insulin aspart  0-9 Units Subcutaneous TID WC   insulin aspart  3 Units Subcutaneous TID WC   methylPREDNISolone (SOLU-MEDROL) injection  0.5 mg/kg Intravenous Q12H   Followed by   Derrill Memo ON 07/07/2021] predniSONE  50 mg Oral Daily   metoprolol tartrate  100 mg Oral BID   pantoprazole  40 mg Oral Daily   sevelamer carbonate  2,400 mg Oral TID WC   zinc sulfate  220 mg Oral Daily   Continuous Infusions:  cefTRIAXone (ROCEPHIN)  IV 2 g (07/05/21 1149)   remdesivir 100 mg in NS 100 mL 100 mg (07/05/21 1249)     LOS: 2 days     Enzo Bi, MD [facility] If 7PM-7AM, please contact night-coverage 07/05/2021, 3:38 PM

## 2021-07-05 NOTE — Progress Notes (Signed)
PHARMACIST - PHYSICIAN COMMUNICATION  CONCERNING: Antibiotic IV to Oral Route Change Policy  RECOMMENDATION: This patient is receiving azithromycin by the intravenous route.  Based on criteria approved by the Pharmacy and Therapeutics Committee, the antibiotic(s) is/are being converted to the equivalent oral dose form(s).   DESCRIPTION: These criteria include: Patient being treated for a respiratory tract infection, urinary tract infection, cellulitis or clostridium difficile associated diarrhea if on metronidazole The patient is not neutropenic and does not exhibit a GI malabsorption state The patient is eating (either orally or via tube) and/or has been taking other orally administered medications for a least 24 hours The patient is improving clinically and has a Tmax < 100.5  If you have questions about this conversion, please contact the [facility]  07/05/21

## 2021-07-06 ENCOUNTER — Encounter: Payer: Self-pay | Admitting: Internal Medicine

## 2021-07-06 DIAGNOSIS — U071 COVID-19: Secondary | ICD-10-CM | POA: Diagnosis not present

## 2021-07-06 DIAGNOSIS — J1282 Pneumonia due to coronavirus disease 2019: Secondary | ICD-10-CM | POA: Diagnosis not present

## 2021-07-06 LAB — CBC
HCT: 32 % — ABNORMAL LOW (ref 39.0–52.0)
Hemoglobin: 10.2 g/dL — ABNORMAL LOW (ref 13.0–17.0)
MCH: 30.8 pg (ref 26.0–34.0)
MCHC: 31.9 g/dL (ref 30.0–36.0)
MCV: 96.7 fL (ref 80.0–100.0)
Platelets: 182 10*3/uL (ref 150–400)
RBC: 3.31 MIL/uL — ABNORMAL LOW (ref 4.22–5.81)
RDW: 14.3 % (ref 11.5–15.5)
WBC: 7.3 10*3/uL (ref 4.0–10.5)
nRBC: 0 % (ref 0.0–0.2)

## 2021-07-06 LAB — GLUCOSE, CAPILLARY
Glucose-Capillary: 126 mg/dL — ABNORMAL HIGH (ref 70–99)
Glucose-Capillary: 143 mg/dL — ABNORMAL HIGH (ref 70–99)
Glucose-Capillary: 141 mg/dL — ABNORMAL HIGH (ref 70–99)
Glucose-Capillary: 124 mg/dL — ABNORMAL HIGH (ref 70–99)

## 2021-07-06 LAB — MAGNESIUM: Magnesium: 2.2 mg/dL (ref 1.7–2.4)

## 2021-07-06 LAB — BASIC METABOLIC PANEL
Anion gap: 14 (ref 5–15)
BUN: 85 mg/dL — ABNORMAL HIGH (ref 8–23)
CO2: 25 mmol/L (ref 22–32)
Calcium: 7.9 mg/dL — ABNORMAL LOW (ref 8.9–10.3)
Chloride: 95 mmol/L — ABNORMAL LOW (ref 98–111)
Creatinine, Ser: 8.67 mg/dL — ABNORMAL HIGH (ref 0.61–1.24)
GFR, Estimated: 6 mL/min — ABNORMAL LOW (ref >60)
Glucose, Bld: 129 mg/dL — ABNORMAL HIGH (ref 70–99)
Potassium: 5.4 mmol/L — ABNORMAL HIGH (ref 3.5–5.1)
Sodium: 134 mmol/L — ABNORMAL LOW (ref 135–145)

## 2021-07-06 LAB — C-REACTIVE PROTEIN: CRP: 11.8 mg/dL — ABNORMAL HIGH (ref <1.0)

## 2021-07-06 MED ORDER — PATIROMER SORBITEX CALCIUM 8.4 G PO PACK
8.4000 g | PACK | Freq: Once | ORAL | Status: AC
  Administered 2021-07-06: 15:00:00 8.4 g via ORAL
  Filled 2021-07-06: qty 1

## 2021-07-06 NOTE — Telephone Encounter (Signed)
I left him a message that Dr. Amalia Hailey had sent the referral on 06/30/2021.  I faxed a copy of the order to [facility] on Wednesday.  I just attempted to call [facility].  I left them a message to call me back on tomorrow.  I apologized to him for the lack of communication.

## 2021-07-06 NOTE — Progress Notes (Signed)
[facility]  ROUNDING NOTE   Subjective:   Scott Castro is a 65 y.o. male with past medical conditions including peripheral artery disease, hypertension, asthma, end-stage renal disease on hemodialysis.  Patient presents to the emergency room with complaints of weakness, chest congestion, dry cough and fever with increased shortness of breath for 3 to 4 days.  Patient was found to be COVID-positive with pneumonia and was admitted for Hypoxia [R09.02] Right leg swelling [M79.89] Fever, unspecified fever cause [R50.9] Community acquired pneumonia of right lung, unspecified part of lung [J18.9] COVID [U07.1] Pneumonia due to COVID-19 virus [U07.1, J12.82]  Patient is known to our practice from multiple hospital admissions.  He currently receives outpatient dialysis treatments at [facility] on a TTS schedule.  He is supervised by [facility] nephrology.    Patient resting comfortably States he feels well  Denies shortness of breath Cough Afebrile  Objective:  Vital signs in last 24 hours:  Temp:  [97.4 F (36.3 C)-98.4 F (36.9 C)] 98.2 F (36.8 C) (11/28 1138) Pulse Rate:  [58-105] 80 (11/28 1138) Resp:  [16-19] 16 (11/28 1138) BP: (113-158)/(45-94) 134/94 (11/28 1138) SpO2:  [91 %-98 %] 98 % (11/28 1138)  Weight change:  Filed Weights   07/03/21 1619 07/04/21 1525 07/04/21 1835  Weight: 70 kg 71.6 kg 70.3 kg    Intake/Output: I/O last 3 completed shifts: In: 879.2 [P.O.:240; IV Piggyback:639.2] Out: -    Intake/Output this shift:  No intake/output data recorded.  Physical Exam: General: NAD, resting in bed  Head: Normocephalic, atraumatic. Moist oral mucosal membranes  Eyes: Anicteric  Lungs:  Diminished bases, normal effort  Heart: Regular rate and rhythm  Abdomen:  Soft, nontender  Extremities: No peripheral edema.  Neurologic: Nonfocal, moving all four extremities  Skin: No lesions  Access: Left aVF    Basic Metabolic Panel: Recent Labs  Lab  07/03/21 1623 07/04/21 0641 07/04/21 1235 07/05/21 0611 07/06/21 0610  NA 140 140  --  135 134*  K 5.0 6.3* 5.4* 4.3 5.4*  CL 100 102  --  95* 95*  CO2 27 27  --  25 25  GLUCOSE 115* 118*  --  130* 129*  BUN 49* 59*  --  51* 85*  CREATININE 9.56* 10.59*  --  7.37* 8.67*  CALCIUM 8.3* 8.0*  --  8.8* 7.9*  MG  --  2.3  --  2.0 2.2  PHOS  --  7.1*  --   --   --      Liver Function Tests: Recent Labs  Lab 07/03/21 1623 07/04/21 0641  AST 15 11*  ALT 12 9  ALKPHOS 81 67  BILITOT 1.2 0.7  PROT 7.4 6.3*  ALBUMIN 3.4* 2.7*    No results for input(s): LIPASE, AMYLASE in the last 168 hours. No results for input(s): AMMONIA in the last 168 hours.  CBC: Recent Labs  Lab 07/03/21 1623 07/04/21 0641 07/05/21 0611 07/06/21 0610  WBC 9.4 7.5 9.7 7.3  NEUTROABS 8.1* 6.6  --   --   HGB 10.9* 9.2* 10.9* 10.2*  HCT 35.3* 29.4* 33.9* 32.0*  MCV 100.9* 102.4* 99.4 96.7  PLT 202 145* 221 182     Cardiac Enzymes: No results for input(s): CKTOTAL, CKMB, CKMBINDEX, TROPONINI in the last 168 hours.  BNP: Invalid input(s): POCBNP  CBG: Recent Labs  Lab 07/05/21 1146 07/05/21 1558 07/05/21 2116 07/06/21 0842 07/06/21 1139  GLUCAP 239* 150* 137* 126* 143*     Microbiology: Results for  orders placed or performed during the hospital encounter of 07/03/21  Resp Panel by RT-PCR (Flu A&B, Covid) Nasopharyngeal Swab     Status: Abnormal   Collection Time: 07/03/21  4:23 PM   Specimen: Nasopharyngeal Swab; Nasopharyngeal(NP) swabs in vial transport medium  Result Value Ref Range Status   SARS Coronavirus 2 by RT PCR POSITIVE (A) NEGATIVE Final    Comment: RESULT CALLED TO, READ BACK BY AND VERIFIED WITH: MAGGIE BARBER @ 1741 ON 07/03/2021 BY CAF (NOTE) SARS-CoV-2 target nucleic acids are DETECTED.  The SARS-CoV-2 RNA is generally detectable in upper respiratory specimens during the acute phase of infection. Positive results are indicative of the presence of the  identified virus, but do not rule out bacterial infection or co-infection with other pathogens not detected by the test. Clinical correlation with patient history and other diagnostic information is necessary to determine patient infection status. The expected result is Negative.  Fact Sheet for Patients: EntrepreneurPulse.com.au  Fact Sheet for Healthcare Providers: IncredibleEmployment.be  This test is not yet approved or cleared by the [place] FDA and  has been authorized for detection and/or diagnosis of SARS-CoV-2 by FDA under an Emergency Use Authorization (EUA).  This EUA will remain in effect (meaning this tes t can be used) for the duration of  the COVID-19 declaration under Section 564(b)(1) of the Act, 21 U.S.C. section 360bbb-3(b)(1), unless the authorization is terminated or revoked sooner.     Influenza A by PCR NEGATIVE NEGATIVE Final   Influenza B by PCR NEGATIVE NEGATIVE Final    Comment: (NOTE) The Xpert Xpress SARS-CoV-2/FLU/RSV plus assay is intended as an aid in the diagnosis of influenza from Nasopharyngeal swab specimens and should not be used as a sole basis for treatment. Nasal washings and aspirates are unacceptable for Xpert Xpress SARS-CoV-2/FLU/RSV testing.  Fact Sheet for Patients: EntrepreneurPulse.com.au  Fact Sheet for Healthcare Providers: IncredibleEmployment.be  This test is not yet approved or cleared by the [place] FDA and has been authorized for detection and/or diagnosis of SARS-CoV-2 by FDA under an Emergency Use Authorization (EUA). This EUA will remain in effect (meaning this test can be used) for the duration of the COVID-19 declaration under Section 564(b)(1) of the Act, 21 U.S.C. section 360bbb-3(b)(1), unless the authorization is terminated or revoked.  Performed at [facility], [address] [place], [place] [address]   Culture,  blood (Routine x 2)     Status: None (Preliminary result)   Collection Time: 07/03/21  4:23 PM   Specimen: BLOOD  Result Value Ref Range Status   Specimen Description BLOOD RIGHT ANTECUBITAL  Final   Special Requests   Final    BOTTLES DRAWN AEROBIC AND ANAEROBIC Blood Culture results may not be optimal due to an excessive volume of blood received in culture bottles   Culture   Final    NO GROWTH 3 DAYS Performed at [facility], [address], [place],  [address]    Report Status PENDING  Incomplete  Culture, blood (Routine x 2)     Status: None (Preliminary result)   Collection Time: 07/03/21  4:23 PM   Specimen: BLOOD  Result Value Ref Range Status   Specimen Description BLOOD BLOOD RIGHT FOREARM  Final   Special Requests   Final    BOTTLES DRAWN AEROBIC AND ANAEROBIC Blood Culture results may not be optimal due to an excessive volume of blood received in culture bottles   Culture   Final    NO GROWTH  3 DAYS Performed at [facility], [address] [place], [place] [address]    Report Status PENDING  Incomplete    Coagulation Studies: Recent Labs    07/03/21 1623  LABPROT 15.0  INR 1.2     Urinalysis: No results for input(s): COLORURINE, LABSPEC, PHURINE, GLUCOSEU, HGBUR, BILIRUBINUR, KETONESUR, PROTEINUR, UROBILINOGEN, NITRITE, LEUKOCYTESUR in the last 72 hours.  Invalid input(s): APPERANCEUR    Imaging: No results found.   Medications:    cefTRIAXone (ROCEPHIN)  IV 2 g (07/06/21 1044)   remdesivir 100 mg in NS 100 mL 100 mg (07/06/21 0852)    albuterol  2 puff Inhalation Q6H   vitamin C  500 mg Oral Daily   azithromycin  500 mg Oral Daily   Chlorhexidine Gluconate Cloth  6 each Topical Q0600   cinacalcet  60 mg Oral Q breakfast   dexamethasone  6 mg Oral Daily   dextromethorphan-guaiFENesin  1 tablet Oral BID   heparin injection (subcutaneous)  5,000 Units Subcutaneous Q8H   insulin aspart  0-9 Units Subcutaneous TID WC    insulin aspart  3 Units Subcutaneous TID WC   metoprolol tartrate  100 mg Oral BID   pantoprazole  40 mg Oral Daily   sevelamer carbonate  2,400 mg Oral TID WC   zinc sulfate  220 mg Oral Daily   acetaminophen, chlorpheniramine-HYDROcodone, guaiFENesin-dextromethorphan, ipratropium-albuterol, ondansetron **OR** ondansetron (ZOFRAN) IV  Assessment/ Plan:  [name] is a 65 y.o.  male with past medical conditions including peripheral artery disease, hypertension, asthma, end-stage renal disease on hemodialysis. Patient admitted for Hypoxia [R09.02] Right leg swelling [M79.89] Fever, unspecified fever cause [R50.9] Community acquired pneumonia of right lung, unspecified part of lung [J18.9] COVID [U07.1] Pneumonia due to COVID-19 virus [U07.1, J12.82]  [facility] TTS [place]. [facility]  End-stage renal disease with hyperkalemia on hemodialysis.  Will maintain outpatient schedule if possible. Next treatment scheduled for Tuesday. Will order Veltassa 8.4g once for increased potassium.  2. Anemia of chronic kidney disease.  Lab Results  Component Value Date   HGB 10.2 (L) 07/06/2021  Hemoglobin within acceptable range  3. Secondary Hyperparathyroidism:  Lab Results  Component Value Date   PTH 93 (H) 04/09/2019   CALCIUM 7.9 (L) 07/06/2021   PHOS 7.1 (H) 07/04/2021   will continue to monitor bone minerals during this admission. Sevelamer ordered with meals  4.  Hypertension with chronic kidney disease.  Home regimen includes amlodipine, enalapril, and metoprolol.  Currently receiving clonidine and metoprolol.  5.  COVID-19 pneumonia Found positive on 07/03/2021. Remdesivir and supportive therapies ordered Antibiotic treatment with azithromycin and ceftriaxone.   LOS: 3   11/28/202212:13 PM

## 2021-07-06 NOTE — Progress Notes (Signed)
PROGRESS NOTE    Scott Castro  IBB:[id number] DOB: [birth year] DOA: 07/03/2021 PCP: Franciso Bend, MD  156A/[id number]-AA   Assessment & Plan:   Principal Problem:   Pneumonia due to COVID-19 virus Active Problems:   COPD (chronic obstructive pulmonary disease) ([place])   Acute hypoxemic respiratory failure ([place])   ESRD (end stage renal disease) ([place])   Essential hypertension   CAP (community acquired pneumonia)   Hypoalbuminemia due to protein-calorie malnutrition ([place])   Macrocytic anemia   PAD (peripheral artery disease) ([facility])   GERD (gastroesophageal reflux disease)   Mixed hyperlipidemia   Scott Castro is a 65 y.o. male with medical history significant for hypertension, ESRD on HD, COPD/asthma due to secondhand smoke and peripheral arterial disease who presents to the emergency department due to 3-4 day onset of weakness, chest congestion, dry cough, fever and increased shortness of breath which worsens on ambulation.  Patient complained of worsening shortness of breath today, so he activated EMS.  On arrival of EMS team, he was noted to have an O2 sat of 87% on room air, this improved to 90% on supplemental oxygen at 2 LPM and 99% on submental oxygen via [facility] at 4 LPM (patient does not use oxygen at baseline).  Last dialysis was on Wednesday (11/23)  Patient states that he already obtained to COVID-19 virus vaccination (Moderna), but did not receive the booster dose.   Acute respiratory failure with hypoxia 2/2 COVID-19 PNA with superimposed RLL CAP  --Chest x-ray with finding of RLL consolidation. --procal 2, CRP 15.3.  started on IV solumedrol, transitioned to oral decadron. --Pt was not given his day 2 of abx due to being away at dialysis. Plan: --cont Remdesivir, day 4 of 5 --cont ceftriaxone and azithro --cont oral decadron --cough meds PRN  Sepsis, POA --fever to 103.1, tachycardia, source PNA --tx as above  COPD/asthma --not on home daily bronchodilators  ESRD on HD  TTS Last dialysis was on Wednesday (11/23) due to holiday schedule Plan: --iHD per nephrology --Continue [name], Sensipar  Hyperkalemia, recurrent --K+ increased from 5.0 to 6.3 morning of 11/26. --s/p insulin 5u + D50 and Veltessa --potassium trending up again Plan: --Veltassa 8.4g x1, per nephro --iHD for potassium removal  PAD Patient states that he follows with Dr. Ruby Cola (podiatry) --right toes felt cold and appeared dark red.  Right great toe reported to be painful with pressure --ABI BLE --outpatient follow-up with vascular surgery  GERD/history of Barrett's esophagus --cont PPI  Essential hypertension --clonidine d/c'ed since pt has been refusing, and not needing it --cont home Lopressor   DVT prophylaxis: Heparin SQ Code Status: Full code  Family Communication:  Level of care: Med-Surg Dispo:   The patient is from: home Anticipated d/c is to: home Anticipated d/c date is: likely tomorrow Patient currently is not medically ready to d/c due to: IV Remdesivir and IV abx for PNA   Subjective and Interval History:  Breathing improving.  Pt reported pain in his right toe, and mentioned outpatient test ordered for this issue (ABI).   Objective: Vitals:   07/05/21 2145 07/06/21 0426 07/06/21 0824 07/06/21 1138  BP: 139/78 125/85 (!) 158/89 (!) 134/94  Pulse: 98 93 94 80  Resp: 17 18 19 16   Temp: 98.2 F (36.8 C) 98 F (36.7 C) (!) 97.4 F (36.3 C) 98.2 F (36.8 C)  TempSrc:  Oral    SpO2: 91% 94% 96% 98%  Weight:      Height:  Intake/Output Summary (Last 24 hours) at 07/06/2021 1523 Last data filed at 07/06/2021 1400 Gross per 24 hour  Intake 200 ml  Output 0 ml  Net 200 ml   Filed Weights   07/03/21 1619 07/04/21 1525 07/04/21 1835  Weight: 70 kg 71.6 kg 70.3 kg    Examination:   Constitutional: NAD, AAOx3 HEENT: conjunctivae and lids normal, EOMI CV: No cyanosis.   RESP: normal respiratory effort, on RA Extremities: No  effusions, edema in BLE.  Right toes cold and dark red appearing. SKIN: warm, dry Neuro: II - XII grossly intact.   Psych: Normal mood and affect.  Appropriate judgement and reason   Data Reviewed: I have personally reviewed following labs and imaging studies  CBC: Recent Labs  Lab 07/03/21 1623 07/04/21 0641 07/05/21 0611 07/06/21 0610  WBC 9.4 7.5 9.7 7.3  NEUTROABS 8.1* 6.6  --   --   HGB 10.9* 9.2* 10.9* 10.2*  HCT 35.3* 29.4* 33.9* 32.0*  MCV 100.9* 102.4* 99.4 96.7  PLT 202 145* 221 916   Basic Metabolic Panel: Recent Labs  Lab 07/03/21 1623 07/04/21 0641 07/04/21 1235 07/05/21 0611 07/06/21 0610  NA 140 140  --  135 134*  K 5.0 6.3* 5.4* 4.3 5.4*  CL 100 102  --  95* 95*  CO2 27 27  --  25 25  GLUCOSE 115* 118*  --  130* 129*  BUN 49* 59*  --  51* 85*  CREATININE 9.56* 10.59*  --  7.37* 8.67*  CALCIUM 8.3* 8.0*  --  8.8* 7.9*  MG  --  2.3  --  2.0 2.2  PHOS  --  7.1*  --   --   --    GFR: Estimated Creatinine Clearance: 8.3 mL/min (A) (by C-G formula based on SCr of 8.67 mg/dL (H)). Liver Function Tests: Recent Labs  Lab 07/03/21 1623 07/04/21 0641  AST 15 11*  ALT 12 9  ALKPHOS 81 67  BILITOT 1.2 0.7  PROT 7.4 6.3*  ALBUMIN 3.4* 2.7*   No results for input(s): LIPASE, AMYLASE in the last 168 hours. No results for input(s): AMMONIA in the last 168 hours. Coagulation Profile: Recent Labs  Lab 07/03/21 1623  INR 1.2   Cardiac Enzymes: No results for input(s): CKTOTAL, CKMB, CKMBINDEX, TROPONINI in the last 168 hours. BNP (last 3 results) No results for input(s): PROBNP in the last 8760 hours. HbA1C: No results for input(s): HGBA1C in the last 72 hours. CBG: Recent Labs  Lab 07/05/21 1146 07/05/21 1558 07/05/21 2116 07/06/21 0842 07/06/21 1139  GLUCAP 239* 150* 137* 126* 143*   Lipid Profile: No results for input(s): CHOL, HDL, LDLCALC, TRIG, CHOLHDL, LDLDIRECT in the last 72 hours. Thyroid Function Tests: No results for input(s):  TSH, T4TOTAL, FREET4, T3FREE, THYROIDAB in the last 72 hours. Anemia Panel: Recent Labs    07/04/21 0641  FERRITIN 874*   Sepsis Labs: Recent Labs  Lab 07/03/21 1623 07/03/21 1650  PROCALCITON  --  2.00  LATICACIDVEN 1.7  --     Recent Results (from the past 240 hour(s))  Resp Panel by RT-PCR (Flu A&B, Covid) Nasopharyngeal Swab     Status: Abnormal   Collection Time: 07/03/21  4:23 PM   Specimen: Nasopharyngeal Swab; Nasopharyngeal(NP) swabs in vial transport medium  Result Value Ref Range Status   SARS Coronavirus 2 by RT PCR POSITIVE (A) NEGATIVE Final    Comment: RESULT CALLED TO, READ BACK BY AND VERIFIED WITH: MAGGIE BARBER @ 1741 ON  07/03/2021 BY CAF (NOTE) SARS-CoV-2 target nucleic acids are DETECTED.  The SARS-CoV-2 RNA is generally detectable in upper respiratory specimens during the acute phase of infection. Positive results are indicative of the presence of the identified virus, but do not rule out bacterial infection or co-infection with other pathogens not detected by the test. Clinical correlation with patient history and other diagnostic information is necessary to determine patient infection status. The expected result is Negative.  Fact Sheet for Patients: EntrepreneurPulse.com.au  Fact Sheet for Healthcare Providers: IncredibleEmployment.be  This test is not yet approved or cleared by the [place] FDA and  has been authorized for detection and/or diagnosis of SARS-CoV-2 by FDA under an Emergency Use Authorization (EUA).  This EUA will remain in effect (meaning this tes t can be used) for the duration of  the COVID-19 declaration under Section 564(b)(1) of the Act, 21 U.S.C. section 360bbb-3(b)(1), unless the authorization is terminated or revoked sooner.     Influenza A by PCR NEGATIVE NEGATIVE Final   Influenza B by PCR NEGATIVE NEGATIVE Final    Comment: (NOTE) The Xpert Xpress SARS-CoV-2/FLU/RSV plus  assay is intended as an aid in the diagnosis of influenza from Nasopharyngeal swab specimens and should not be used as a sole basis for treatment. Nasal washings and aspirates are unacceptable for Xpert Xpress SARS-CoV-2/FLU/RSV testing.  Fact Sheet for Patients: EntrepreneurPulse.com.au  Fact Sheet for Healthcare Providers: IncredibleEmployment.be  This test is not yet approved or cleared by the [place] FDA and has been authorized for detection and/or diagnosis of SARS-CoV-2 by FDA under an Emergency Use Authorization (EUA). This EUA will remain in effect (meaning this test can be used) for the duration of the COVID-19 declaration under Section 564(b)(1) of the Act, 21 U.S.C. section 360bbb-3(b)(1), unless the authorization is terminated or revoked.  Performed at [facility], [address] [place], [place] [address]   Culture, blood (Routine x 2)     Status: None (Preliminary result)   Collection Time: 07/03/21  4:23 PM   Specimen: BLOOD  Result Value Ref Range Status   Specimen Description BLOOD RIGHT ANTECUBITAL  Final   Special Requests   Final    BOTTLES DRAWN AEROBIC AND ANAEROBIC Blood Culture results may not be optimal due to an excessive volume of blood received in culture bottles   Culture   Final    NO GROWTH 3 DAYS Performed at [facility], [address], [place], [place] [address]    Report Status PENDING  Incomplete  Culture, blood (Routine x 2)     Status: None (Preliminary result)   Collection Time: 07/03/21  4:23 PM   Specimen: BLOOD  Result Value Ref Range Status   Specimen Description BLOOD BLOOD RIGHT FOREARM  Final   Special Requests   Final    BOTTLES DRAWN AEROBIC AND ANAEROBIC Blood Culture results may not be optimal due to an excessive volume of blood received in culture bottles   Culture   Final    NO GROWTH 3 DAYS Performed at [facility], [address]  [place], [place] [address]    Report Status PENDING  Incomplete      Radiology Studies: No results found.   Scheduled Meds:  albuterol  2 puff Inhalation Q6H   vitamin C  500 mg Oral Daily   azithromycin  500 mg Oral Daily   Chlorhexidine Gluconate Cloth  6 each Topical Q0600   cinacalcet  60 mg Oral Q breakfast  dexamethasone  6 mg Oral Daily   dextromethorphan-guaiFENesin  1 tablet Oral BID   heparin injection (subcutaneous)  5,000 Units Subcutaneous Q8H   insulin aspart  0-9 Units Subcutaneous TID WC   insulin aspart  3 Units Subcutaneous TID WC   metoprolol tartrate  100 mg Oral BID   pantoprazole  40 mg Oral Daily   sevelamer carbonate  2,400 mg Oral TID WC   zinc sulfate  220 mg Oral Daily   Continuous Infusions:  cefTRIAXone (ROCEPHIN)  IV 2 g (07/06/21 1044)   remdesivir 100 mg in NS 100 mL 100 mg (07/06/21 0852)     LOS: 3 days     Enzo Bi, MD [facility] If 7PM-7AM, please contact night-coverage 07/06/2021, 3:23 PM

## 2021-07-07 ENCOUNTER — Inpatient Hospital Stay: Payer: Medicare Other

## 2021-07-07 DIAGNOSIS — U071 COVID-19: Secondary | ICD-10-CM | POA: Diagnosis not present

## 2021-07-07 DIAGNOSIS — J1282 Pneumonia due to coronavirus disease 2019: Secondary | ICD-10-CM | POA: Diagnosis not present

## 2021-07-07 LAB — CBC
HCT: 33.7 % — ABNORMAL LOW (ref 39.0–52.0)
Hemoglobin: 10.7 g/dL — ABNORMAL LOW (ref 13.0–17.0)
MCH: 30.7 pg (ref 26.0–34.0)
MCHC: 31.8 g/dL (ref 30.0–36.0)
MCV: 96.8 fL (ref 80.0–100.0)
Platelets: 187 10*3/uL (ref 150–400)
RBC: 3.48 MIL/uL — ABNORMAL LOW (ref 4.22–5.81)
RDW: 14.4 % (ref 11.5–15.5)
WBC: 7 10*3/uL (ref 4.0–10.5)
nRBC: 0 % (ref 0.0–0.2)

## 2021-07-07 LAB — BASIC METABOLIC PANEL
Anion gap: 15 (ref 5–15)
BUN: 101 mg/dL — ABNORMAL HIGH (ref 8–23)
CO2: 24 mmol/L (ref 22–32)
Calcium: 7.8 mg/dL — ABNORMAL LOW (ref 8.9–10.3)
Chloride: 93 mmol/L — ABNORMAL LOW (ref 98–111)
Creatinine, Ser: 10.15 mg/dL — ABNORMAL HIGH (ref 0.61–1.24)
GFR, Estimated: 5 mL/min — ABNORMAL LOW (ref >60)
Glucose, Bld: 110 mg/dL — ABNORMAL HIGH (ref 70–99)
Potassium: 5.6 mmol/L — ABNORMAL HIGH (ref 3.5–5.1)
Sodium: 132 mmol/L — ABNORMAL LOW (ref 135–145)

## 2021-07-07 LAB — GLUCOSE, CAPILLARY
Glucose-Capillary: 111 mg/dL — ABNORMAL HIGH (ref 70–99)
Glucose-Capillary: 148 mg/dL — ABNORMAL HIGH (ref 70–99)
Glucose-Capillary: 104 mg/dL — ABNORMAL HIGH (ref 70–99)

## 2021-07-07 LAB — MAGNESIUM: Magnesium: 2.2 mg/dL (ref 1.7–2.4)

## 2021-07-07 LAB — C-REACTIVE PROTEIN: CRP: 8.2 mg/dL — ABNORMAL HIGH (ref <1.0)

## 2021-07-07 MED ORDER — SODIUM CHLORIDE 0.9 % IV SOLN
100.0000 mg | Freq: Every day | INTRAVENOUS | Status: DC
  Filled 2021-07-07: qty 20

## 2021-07-07 MED ORDER — SODIUM CHLORIDE 0.9 % IV SOLN: INTRAVENOUS | Status: DC | PRN

## 2021-07-07 NOTE — Progress Notes (Signed)
Patient tolerates HD treatment without adverse reaction, AVF functions poorly, with elevated venous and arterial pressures requiring reduction in BFR. Targeted UF met, vital signs at admitting baseline. Post treatment excessive bleeding on arterial site. No medication given this treatment. Report given to covering nurse.  

## 2021-07-07 NOTE — Progress Notes (Signed)
[facility]  ROUNDING NOTE   Subjective:   Scott Castro is a 65 y.o. male with past medical conditions including peripheral artery disease, hypertension, asthma, end-stage renal disease on hemodialysis.  Patient presents to the emergency room with complaints of weakness, chest congestion, dry cough and fever with increased shortness of breath for 3 to 4 days.  Patient was found to be COVID-positive with pneumonia and was admitted for Hypoxia [R09.02] Right leg swelling [M79.89] Fever, unspecified fever cause [R50.9] Community acquired pneumonia of right lung, unspecified part of lung [J18.9] COVID [U07.1] Pneumonia due to COVID-19 virus [U07.1, J12.82]  Patient is known to our practice from multiple hospital admissions.  He currently receives outpatient dialysis treatments at [facility] on a TTS schedule.  He is supervised by [facility] nephrology.    Patient resting comfortably in bed, breakfast at bedside Appetite remains poor Patient continues to complain of weakness Coughing has improved.  Objective:  Vital signs in last 24 hours:  Temp:  [96.5 F (35.8 C)-98.2 F (36.8 C)] 97.5 F (36.4 C) (11/29 0809) Pulse Rate:  [80-94] 83 (11/29 0809) Resp:  [16-20] 20 (11/29 0809) BP: (134-153)/(81-95) 153/91 (11/29 0809) SpO2:  [98 %-99 %] 98 % (11/29 0809)  Weight change:  Filed Weights   07/03/21 1619 07/04/21 1525 07/04/21 1835  Weight: 70 kg 71.6 kg 70.3 kg    Intake/Output: I/O last 3 completed shifts: In: 200 [IV Piggyback:200] Out: 1 [Stool:1]   Intake/Output this shift:  No intake/output data recorded.  Physical Exam: General: NAD, resting in bed  Head: Normocephalic, atraumatic. Moist oral mucosal membranes  Eyes: Anicteric  Lungs:  Diminished bases, normal effort  Heart: Regular rate and rhythm  Abdomen:  Soft, nontender  Extremities: No peripheral edema.  Neurologic: Nonfocal, moving all four extremities  Skin: No lesions  Access: Left aVF     Basic Metabolic Panel: Recent Labs  Lab 07/03/21 1623 07/04/21 0641 07/04/21 1235 07/05/21 0611 07/06/21 0610 07/07/21 0435  NA 140 140  --  135 134* 132*  K 5.0 6.3* 5.4* 4.3 5.4* 5.6*  CL 100 102  --  95* 95* 93*  CO2 27 27  --  25 25 24   GLUCOSE 115* 118*  --  130* 129* 110*  BUN 49* 59*  --  51* 85* 101*  CREATININE 9.56* 10.59*  --  7.37* 8.67* 10.15*  CALCIUM 8.3* 8.0*  --  8.8* 7.9* 7.8*  MG  --  2.3  --  2.0 2.2 2.2  PHOS  --  7.1*  --   --   --   --      Liver Function Tests: Recent Labs  Lab 07/03/21 1623 07/04/21 0641  AST 15 11*  ALT 12 9  ALKPHOS 81 67  BILITOT 1.2 0.7  PROT 7.4 6.3*  ALBUMIN 3.4* 2.7*    No results for input(s): LIPASE, AMYLASE in the last 168 hours. No results for input(s): AMMONIA in the last 168 hours.  CBC: Recent Labs  Lab 07/03/21 1623 07/04/21 0641 07/05/21 0611 07/06/21 0610 07/07/21 0435  WBC 9.4 7.5 9.7 7.3 7.0  NEUTROABS 8.1* 6.6  --   --   --   HGB 10.9* 9.2* 10.9* 10.2* 10.7*  HCT 35.3* 29.4* 33.9* 32.0* 33.7*  MCV 100.9* 102.4* 99.4 96.7 96.8  PLT 202 145* 221 182 187     Cardiac Enzymes: No results for input(s): CKTOTAL, CKMB, CKMBINDEX, TROPONINI in the last 168 hours.  BNP: Invalid input(s): POCBNP  CBG: Recent Labs  Lab 07/06/21 0842 07/06/21 1139 07/06/21 1653 07/06/21 2044 07/07/21 0807  GLUCAP 126* 143* 141* 124* 111*     Microbiology: Results for orders placed or performed during the hospital encounter of 07/03/21  Resp Panel by RT-PCR (Flu A&B, Covid) Nasopharyngeal Swab     Status: Abnormal   Collection Time: 07/03/21  4:23 PM   Specimen: Nasopharyngeal Swab; Nasopharyngeal(NP) swabs in vial transport medium  Result Value Ref Range Status   SARS Coronavirus 2 by RT PCR POSITIVE (A) NEGATIVE Final    Comment: RESULT CALLED TO, READ BACK BY AND VERIFIED WITH: MAGGIE BARBER @ 1741 ON 07/03/2021 BY CAF (NOTE) SARS-CoV-2 target nucleic acids are DETECTED.  The SARS-CoV-2 RNA is  generally detectable in upper respiratory specimens during the acute phase of infection. Positive results are indicative of the presence of the identified virus, but do not rule out bacterial infection or co-infection with other pathogens not detected by the test. Clinical correlation with patient history and other diagnostic information is necessary to determine patient infection status. The expected result is Negative.  Fact Sheet for Patients: EntrepreneurPulse.com.au  Fact Sheet for Healthcare Providers: IncredibleEmployment.be  This test is not yet approved or cleared by the [place] FDA and  has been authorized for detection and/or diagnosis of SARS-CoV-2 by FDA under an Emergency Use Authorization (EUA).  This EUA will remain in effect (meaning this tes t can be used) for the duration of  the COVID-19 declaration under Section 564(b)(1) of the Act, 21 U.S.C. section 360bbb-3(b)(1), unless the authorization is terminated or revoked sooner.     Influenza A by PCR NEGATIVE NEGATIVE Final   Influenza B by PCR NEGATIVE NEGATIVE Final    Comment: (NOTE) The Xpert Xpress SARS-CoV-2/FLU/RSV plus assay is intended as an aid in the diagnosis of influenza from Nasopharyngeal swab specimens and should not be used as a sole basis for treatment. Nasal washings and aspirates are unacceptable for Xpert Xpress SARS-CoV-2/FLU/RSV testing.  Fact Sheet for Patients: EntrepreneurPulse.com.au  Fact Sheet for Healthcare Providers: IncredibleEmployment.be  This test is not yet approved or cleared by the [place] FDA and has been authorized for detection and/or diagnosis of SARS-CoV-2 by FDA under an Emergency Use Authorization (EUA). This EUA will remain in effect (meaning this test can be used) for the duration of the COVID-19 declaration under Section 564(b)(1) of the Act, 21 U.S.C. section 360bbb-3(b)(1),  unless the authorization is terminated or revoked.  Performed at [facility], [address] [place], [place] [address]   Culture, blood (Routine x 2)     Status: None (Preliminary result)   Collection Time: 07/03/21  4:23 PM   Specimen: BLOOD  Result Value Ref Range Status   Specimen Description BLOOD RIGHT ANTECUBITAL  Final   Special Requests   Final    BOTTLES DRAWN AEROBIC AND ANAEROBIC Blood Culture results may not be optimal due to an excessive volume of blood received in culture bottles   Culture   Final    NO GROWTH 3 DAYS Performed at [facility], [address], [place],  [address]    Report Status PENDING  Incomplete  Culture, blood (Routine x 2)     Status: None (Preliminary result)   Collection Time: 07/03/21  4:23 PM   Specimen: BLOOD  Result Value Ref Range Status   Specimen Description BLOOD BLOOD RIGHT FOREARM  Final   Special Requests   Final    BOTTLES DRAWN AEROBIC AND ANAEROBIC  Blood Culture results may not be optimal due to an excessive volume of blood received in culture bottles   Culture   Final    NO GROWTH 3 DAYS Performed at [facility], [address] [place], [place] [address]    Report Status PENDING  Incomplete    Coagulation Studies: No results for input(s): LABPROT, INR in the last 72 hours.   Urinalysis: No results for input(s): COLORURINE, LABSPEC, PHURINE, GLUCOSEU, HGBUR, BILIRUBINUR, KETONESUR, PROTEINUR, UROBILINOGEN, NITRITE, LEUKOCYTESUR in the last 72 hours.  Invalid input(s): APPERANCEUR    Imaging: No results found.   Medications:    cefTRIAXone (ROCEPHIN)  IV 2 g (07/06/21 1044)   remdesivir 100 mg in NS 100 mL 100 mg (07/06/21 0852)    albuterol  2 puff Inhalation Q6H   vitamin C  500 mg Oral Daily   azithromycin  500 mg Oral Daily   Chlorhexidine Gluconate Cloth  6 each Topical Q0600   cinacalcet  60 mg Oral Q breakfast   dexamethasone  6 mg Oral Daily    dextromethorphan-guaiFENesin  1 tablet Oral BID   heparin injection (subcutaneous)  5,000 Units Subcutaneous Q8H   insulin aspart  0-9 Units Subcutaneous TID WC   insulin aspart  3 Units Subcutaneous TID WC   metoprolol tartrate  100 mg Oral BID   pantoprazole  40 mg Oral Daily   sevelamer carbonate  2,400 mg Oral TID WC   zinc sulfate  220 mg Oral Daily   acetaminophen, chlorpheniramine-HYDROcodone, guaiFENesin-dextromethorphan, ipratropium-albuterol, ondansetron **OR** ondansetron (ZOFRAN) IV  Assessment/ Plan:  [name] is a 65 y.o.  male with past medical conditions including peripheral artery disease, hypertension, asthma, end-stage renal disease on hemodialysis. Patient admitted for Hypoxia [R09.02] Right leg swelling [M79.89] Fever, unspecified fever cause [R50.9] Community acquired pneumonia of right lung, unspecified part of lung [J18.9] COVID [U07.1] Pneumonia due to COVID-19 virus [U07.1, J12.82]  [facility] TTS [place]. AVF  End-stage renal disease with hyperkalemia on hemodialysis.  Will maintain outpatient schedule if possible.  Dialysis scheduled for later today.  Potassium currently 5.6, will be corrected with dialysis today.  Dialysis coordinator has arranged outpatient treatments within the clinic with isolation room due to COVID status.  2. Anemia of chronic kidney disease.  Lab Results  Component Value Date   HGB 10.7 (L) 07/07/2021  Hemoglobin at goal  3. Secondary Hyperparathyroidism:  Lab Results  Component Value Date   PTH 93 (H) 04/09/2019   CALCIUM 7.8 (L) 07/07/2021   PHOS 7.1 (H) 07/04/2021   Sevelamer ordered with meals  4.  Hypertension with chronic kidney disease.  Home regimen includes amlodipine, enalapril, and metoprolol.  Currently receiving clonidine and metoprolol.  BP 123/89  5.  COVID-19 pneumonia Found positive on 07/03/2021. Remdesivir will be completed tomorrow.  Continue supportive therapies  Antibiotic  treatment with azithromycin and ceftriaxone.   LOS: [place] 11/29/20229:30 AM

## 2021-07-07 NOTE — Progress Notes (Signed)
PROGRESS NOTE    [name]  KTG:[id number] DOB: January 18, 1956 DOA: 07/03/2021 PCP: Franciso Bend, MD  [id number]   Assessment & Plan:   Principal Problem:   Pneumonia due to COVID-19 virus Active Problems:   COPD (chronic obstructive pulmonary disease) ([place])   Acute hypoxemic respiratory failure ([place])   ESRD (end stage renal disease) ([place])   Essential hypertension   CAP (community acquired pneumonia)   Hypoalbuminemia due to protein-calorie malnutrition ([place])   Macrocytic anemia   PAD (peripheral artery disease) ([facility])   GERD (gastroesophageal reflux disease)   Mixed hyperlipidemia   Scott Castro is a 65 y.o. male with medical history significant for hypertension, ESRD on HD, COPD/asthma due to secondhand smoke and peripheral arterial disease who presents to the emergency department due to 3-4 day onset of weakness, chest congestion, dry cough, fever and increased shortness of breath which worsens on ambulation.  Patient complained of worsening shortness of breath today, so he activated EMS.  On arrival of EMS team, he was noted to have an O2 sat of 87% on room air, this improved to 90% on supplemental oxygen at 2 LPM and 99% on submental oxygen via [place] at 4 LPM (patient does not use oxygen at baseline).  Last dialysis was on Wednesday (11/23)  Patient states that he already obtained to COVID-19 virus vaccination (Moderna), but did not receive the booster dose.   Acute respiratory failure with hypoxia 2/2 COVID-19 PNA with superimposed RLL CAP  --Chest x-ray with finding of RLL consolidation. --procal 2, CRP 15.3.  started on IV solumedrol, transitioned to oral decadron. --Pt was not given his day 2 of abx due to being away at dialysis. Plan: --cont Remdesivir, day 5 of 5 --cont ceftriaxone and azithro --cont oral decadron --cough meds PRN  Sepsis, POA --fever to 103.1, tachycardia, source PNA --tx as above  COPD/asthma --not on home daily bronchodilators  ESRD on HD  TTS Last dialysis was on Wednesday (11/23) due to holiday schedule Plan: --iHD per nephro --Continue [name], Sensipar --will need to be set up in a different outpatient dialysis center due to covid pos  Hyperkalemia, recurrent --K+ mildly elevated in between dialysis sessions Plan: --[place] PRN, per nephro --iHD for potassium removal  PAD Patient states that he follows with Dr. Ruby Cola (podiatry) --right toes felt cold and appeared dark red.  Right great toe reported to be painful with pressure Plan: --ABI BLE --outpatient follow-up with vascular surgery  GERD/history of Barrett's esophagus --cont PPI  Essential hypertension --clonidine d/c'ed since pt has been refusing, and not needing it --cont home Lopressor   DVT prophylaxis: Heparin SQ Code Status: Full code  Family Communication:  Level of care: Med-Surg Dispo:   The patient is from: home Anticipated d/c is to: home Anticipated d/c date is: tomorrow   Subjective and Interval History:  Pt reported no appetite and no energy.    Dialysis planned for late tonight.   Objective: Vitals:   07/07/21 1850 07/07/21 1915 07/07/21 1930 07/07/21 1945  BP: 131/88 135/81 113/83 132/88  Pulse: 88 81 65 80  Resp: 18 (!) 22 12 13   Temp: 97.7 F (36.5 C)     TempSrc: Oral     SpO2: 98%   96%  Weight: 72.5 kg     Height:        Intake/Output Summary (Last 24 hours) at 07/07/2021 1957 Last data filed at 07/07/2021 1600 Gross per 24 hour  Intake 45.52 ml  Output --  Net 45.52 ml   Filed Weights   07/04/21 1525 07/04/21 1835 07/07/21 1850  Weight: 71.6 kg 70.3 kg 72.5 kg    Examination:   Constitutional: NAD, AAOx3 HEENT: conjunctivae and lids normal, EOMI CV: No cyanosis.   RESP: normal respiratory effort, on RA Extremities: No effusions, edema in BLE.  Right foot cold. Neuro: II - XII grossly intact.   Psych: depressed mood and affect.  Appropriate judgement and reason   Data Reviewed: I have  personally reviewed following labs and imaging studies  CBC: Recent Labs  Lab 07/03/21 1623 07/04/21 0641 07/05/21 0611 07/06/21 0610 07/07/21 0435  WBC 9.4 7.5 9.7 7.3 7.0  NEUTROABS 8.1* 6.6  --   --   --   HGB 10.9* 9.2* 10.9* 10.2* 10.7*  HCT 35.3* 29.4* 33.9* 32.0* 33.7*  MCV 100.9* 102.4* 99.4 96.7 96.8  PLT 202 145* 221 182 962   Basic Metabolic Panel: Recent Labs  Lab 07/03/21 1623 07/04/21 0641 07/04/21 1235 07/05/21 0611 07/06/21 0610 07/07/21 0435  NA 140 140  --  135 134* 132*  K 5.0 6.3* 5.4* 4.3 5.4* 5.6*  CL 100 102  --  95* 95* 93*  CO2 27 27  --  25 25 24   GLUCOSE 115* 118*  --  130* 129* 110*  BUN 49* 59*  --  51* 85* 101*  CREATININE 9.56* 10.59*  --  7.37* 8.67* 10.15*  CALCIUM 8.3* 8.0*  --  8.8* 7.9* 7.8*  MG  --  2.3  --  2.0 2.2 2.2  PHOS  --  7.1*  --   --   --   --    GFR: Estimated Creatinine Clearance: 7.2 mL/min (A) (by C-G formula based on SCr of 10.15 mg/dL (H)). Liver Function Tests: Recent Labs  Lab 07/03/21 1623 07/04/21 0641  AST 15 11*  ALT 12 9  ALKPHOS 81 67  BILITOT 1.2 0.7  PROT 7.4 6.3*  ALBUMIN 3.4* 2.7*   No results for input(s): LIPASE, AMYLASE in the last 168 hours. No results for input(s): AMMONIA in the last 168 hours. Coagulation Profile: Recent Labs  Lab 07/03/21 1623  INR 1.2   Cardiac Enzymes: No results for input(s): CKTOTAL, CKMB, CKMBINDEX, TROPONINI in the last 168 hours. BNP (last 3 results) No results for input(s): PROBNP in the last 8760 hours. HbA1C: No results for input(s): HGBA1C in the last 72 hours. CBG: Recent Labs  Lab 07/06/21 1653 07/06/21 2044 07/07/21 0807 07/07/21 1224 07/07/21 1758  GLUCAP 141* 124* 111* 148* 104*   Lipid Profile: No results for input(s): CHOL, HDL, LDLCALC, TRIG, CHOLHDL, LDLDIRECT in the last 72 hours. Thyroid Function Tests: No results for input(s): TSH, T4TOTAL, FREET4, T3FREE, THYROIDAB in the last 72 hours. Anemia Panel: No results for input(s):  VITAMINB12, FOLATE, FERRITIN, TIBC, IRON, RETICCTPCT in the last 72 hours.  Sepsis Labs: Recent Labs  Lab 07/03/21 1623 07/03/21 1650  PROCALCITON  --  2.00  LATICACIDVEN 1.7  --     Recent Results (from the past 240 hour(s))  Resp Panel by RT-PCR (Flu A&B, Covid) Nasopharyngeal Swab     Status: Abnormal   Collection Time: 07/03/21  4:23 PM   Specimen: Nasopharyngeal Swab; Nasopharyngeal(NP) swabs in vial transport medium  Result Value Ref Range Status   SARS Coronavirus 2 by RT PCR POSITIVE (A) NEGATIVE Final    Comment: RESULT CALLED TO, READ BACK BY AND VERIFIED WITH: MAGGIE BARBER @ 1741 ON 07/03/2021 BY CAF (NOTE) SARS-CoV-2 target  nucleic acids are DETECTED.  The SARS-CoV-2 RNA is generally detectable in upper respiratory specimens during the acute phase of infection. Positive results are indicative of the presence of the identified virus, but do not rule out bacterial infection or co-infection with other pathogens not detected by the test. Clinical correlation with patient history and other diagnostic information is necessary to determine patient infection status. The expected result is Negative.  Fact Sheet for Patients: EntrepreneurPulse.com.au  Fact Sheet for Healthcare Providers: IncredibleEmployment.be  This test is not yet approved or cleared by the [place] FDA and  has been authorized for detection and/or diagnosis of SARS-CoV-2 by FDA under an Emergency Use Authorization (EUA).  This EUA will remain in effect (meaning this tes t can be used) for the duration of  the COVID-19 declaration under Section 564(b)(1) of the Act, 21 U.S.C. section 360bbb-3(b)(1), unless the authorization is terminated or revoked sooner.     Influenza A by PCR NEGATIVE NEGATIVE Final   Influenza B by PCR NEGATIVE NEGATIVE Final    Comment: (NOTE) The Xpert Xpress SARS-CoV-2/FLU/RSV plus assay is intended as an aid in the diagnosis of  influenza from Nasopharyngeal swab specimens and should not be used as a sole basis for treatment. Nasal washings and aspirates are unacceptable for Xpert Xpress SARS-CoV-2/FLU/RSV testing.  Fact Sheet for Patients: EntrepreneurPulse.com.au  Fact Sheet for Healthcare Providers: IncredibleEmployment.be  This test is not yet approved or cleared by the [place] FDA and has been authorized for detection and/or diagnosis of SARS-CoV-2 by FDA under an Emergency Use Authorization (EUA). This EUA will remain in effect (meaning this test can be used) for the duration of the COVID-19 declaration under Section 564(b)(1) of the Act, 21 U.S.C. section 360bbb-3(b)(1), unless the authorization is terminated or revoked.  Performed at [facility], [address] [place], [place] [address]   Culture, blood (Routine x 2)     Status: None (Preliminary result)   Collection Time: 07/03/21  4:23 PM   Specimen: BLOOD  Result Value Ref Range Status   Specimen Description BLOOD RIGHT ANTECUBITAL  Final   Special Requests   Final    BOTTLES DRAWN AEROBIC AND ANAEROBIC Blood Culture results may not be optimal due to an excessive volume of blood received in culture bottles   Culture   Final    NO GROWTH 4 DAYS Performed at [facility], [address], [place], [place] [address]    Report Status PENDING  Incomplete  Culture, blood (Routine x 2)     Status: None (Preliminary result)   Collection Time: 07/03/21  4:23 PM   Specimen: BLOOD  Result Value Ref Range Status   Specimen Description BLOOD BLOOD RIGHT FOREARM  Final   Special Requests   Final    BOTTLES DRAWN AEROBIC AND ANAEROBIC Blood Culture results may not be optimal due to an excessive volume of blood received in culture bottles   Culture   Final    NO GROWTH 4 DAYS Performed at [facility], [address] [place], [place] [address]    Report Status PENDING   Incomplete      Radiology Studies: US ARTERIAL ABI (SCREENING LOWER EXTREMITY)  Result Date: 07/07/2021 CLINICAL DATA:  right lower extremity rest pain, skin color changes. Diabetes, hypertension. EXAM: NONINVASIVE PHYSIOLOGIC VASCULAR STUDY OF BILATERAL LOWER EXTREMITIES TECHNIQUE: Evaluation of both lower extremities were performed at rest, including calculation of ankle-brachial indices with single level Doppler, pressure and pulse volume recording. COMPARISON:  None. FINDINGS: Right ABI:  1.38 Left ABI:  Non calculable due to vascular noncompressibility Right Lower Extremity: Monophasic distal right posterior tibial arterial waveform. Markedly attenuated dorsalis pedis waveform. Left Lower Extremity: Attenuated distal posterior tibial and dorsalis pedis waveforms. IMPRESSION: Abnormal distal bilateral lower extremity arterial waveforms consistent with arterial occlusive disease of indeterminate severity. ABIs are considered nondiagnostic secondary to vascular noncompressibility presumably related to medial calcification. If further evaluation is needed, consider segmental studies and/or MRA runoff. Electronically Signed   By: Lucrezia Europe M.D.   On: 07/07/2021 16:46     Scheduled Meds:  albuterol  2 puff Inhalation Q6H   vitamin C  500 mg Oral Daily   azithromycin  500 mg Oral Daily   Chlorhexidine Gluconate Cloth  6 each Topical Q0600   cinacalcet  60 mg Oral Q breakfast   dexamethasone  6 mg Oral Daily   dextromethorphan-guaiFENesin  1 tablet Oral BID   heparin injection (subcutaneous)  5,000 Units Subcutaneous Q8H   insulin aspart  0-9 Units Subcutaneous TID WC   insulin aspart  3 Units Subcutaneous TID WC   metoprolol tartrate  100 mg Oral BID   pantoprazole  40 mg Oral Daily   sevelamer carbonate  2,400 mg Oral TID WC   zinc sulfate  220 mg Oral Daily   Continuous Infusions:  sodium chloride 10 mL/hr at 07/07/21 1013   cefTRIAXone (ROCEPHIN)  IV 2 g (07/07/21 1222)   [START ON  07/08/2021] remdesivir 100 mg in NS 100 mL       LOS: 4 days     Enzo Bi, MD [facility] If 7PM-7AM, please contact night-coverage 07/07/2021, 7:57 PM

## 2021-07-08 LAB — BASIC METABOLIC PANEL
Anion gap: 13 (ref 5–15)
BUN: 74 mg/dL — ABNORMAL HIGH (ref 8–23)
CO2: 25 mmol/L (ref 22–32)
Calcium: 7.4 mg/dL — ABNORMAL LOW (ref 8.9–10.3)
Chloride: 96 mmol/L — ABNORMAL LOW (ref 98–111)
Creatinine, Ser: 7.69 mg/dL — ABNORMAL HIGH (ref 0.61–1.24)
GFR, Estimated: 7 mL/min — ABNORMAL LOW (ref >60)
Glucose, Bld: 94 mg/dL (ref 70–99)
Potassium: 5.2 mmol/L — ABNORMAL HIGH (ref 3.5–5.1)
Sodium: 134 mmol/L — ABNORMAL LOW (ref 135–145)

## 2021-07-08 LAB — CULTURE, BLOOD (ROUTINE X 2)
Culture: NO GROWTH
Culture: NO GROWTH

## 2021-07-08 LAB — GLUCOSE, CAPILLARY
Glucose-Capillary: 122 mg/dL — ABNORMAL HIGH (ref 70–99)
Glucose-Capillary: 184 mg/dL — ABNORMAL HIGH (ref 70–99)
Glucose-Capillary: 69 mg/dL — ABNORMAL LOW (ref 70–99)

## 2021-07-08 LAB — CBC
HCT: 31.3 % — ABNORMAL LOW (ref 39.0–52.0)
Hemoglobin: 10.3 g/dL — ABNORMAL LOW (ref 13.0–17.0)
MCH: 31.5 pg (ref 26.0–34.0)
MCHC: 32.9 g/dL (ref 30.0–36.0)
MCV: 95.7 fL (ref 80.0–100.0)
Platelets: 165 10*3/uL (ref 150–400)
RBC: 3.27 MIL/uL — ABNORMAL LOW (ref 4.22–5.81)
RDW: 14.2 % (ref 11.5–15.5)
WBC: 5.6 10*3/uL (ref 4.0–10.5)
nRBC: 0 % (ref 0.0–0.2)

## 2021-07-08 LAB — MAGNESIUM: Magnesium: 2 mg/dL (ref 1.7–2.4)

## 2021-07-08 MED ORDER — PATIROMER SORBITEX CALCIUM 8.4 G PO PACK
8.4000 g | PACK | Freq: Once | ORAL | Status: AC
  Administered 2021-07-08: 8.4 g via ORAL
  Filled 2021-07-08: qty 1

## 2021-07-08 MED ORDER — ALBUTEROL SULFATE HFA 108 (90 BASE) MCG/ACT IN AERS: 2.0000 | INHALATION_SPRAY | Freq: Four times a day (QID) | RESPIRATORY_TRACT | Status: DC | PRN

## 2021-07-08 MED ORDER — GUAIFENESIN-DM 100-10 MG/5ML PO SYRP: 10.0000 mL | ORAL_SOLUTION | ORAL | 0 refills | Status: AC | PRN

## 2021-07-08 MED ORDER — SODIUM CHLORIDE 0.9 % IV SOLN
2.0000 g | INTRAVENOUS | Status: AC
  Administered 2021-07-08: 2 g via INTRAVENOUS
  Filled 2021-07-08: qty 20

## 2021-07-08 NOTE — Discharge Summary (Signed)
Physician Discharge Summary   Scott Castro  male DOB: 1956-04-14  NWG:[id number]  PCP: Franciso Bend, MD  Admit date: 07/03/2021 Discharge date: 07/08/2021  Admitted From: home Disposition:  home CODE STATUS: Full code  Discharge Instructions     Discharge instructions   Complete by: As directed    You were treated for COVID infection and pneumonia with 5 days of Remdesivir and antibiotics, with improvement in respiratory symptoms and no need for supplemental oxygen.  Please resume your cholesterol medication due to your peripheral arterial disease in your feet.   Dr. Enzo Bi [place] [facility] Course:  For full details, please see H&P, progress notes, consult notes and ancillary notes.  Briefly,  Scott Castro is a 65 y.o. male with medical history significant for hypertension, ESRD on HD, COPD/asthma due to secondhand smoke and peripheral arterial disease who presented to the emergency department due to 3-4 day of weakness, chest congestion, dry cough, fever and increased shortness of breath which worsens on ambulation.    On arrival of EMS team, he was noted to have an O2 sat of 87% on room air, this improved to 90% on supplemental oxygen at 2 LPM and 99% on submental oxygen via [place] at 4 LPM (patient does not use oxygen at baseline).  Last dialysis was on Wednesday (11/23)  Patient states that he already obtained to COVID-19 virus vaccination (Moderna), but did not receive the booster dose.   Acute respiratory failure with hypoxia 2/2 COVID-19 PNA with superimposed RLL CAP  --Chest x-ray with finding of RLL consolidation.  Pt was started on IV Remdesivir as well as ceftriaxone and azithromycin for CAP treatment. --procal 2, CRP 15.3.  started on IV solumedrol, transitioned to oral decadron. --Pt completed 5 days of Remdesivir and ceftriaxone/azithromycin, and was sating well on room air prior to discharge.   Sepsis, POA --fever to 103.1, tachycardia, source  PNA --tx as above  COPD/asthma --not on home daily bronchodilators  ESRD on HD TTS Last dialysis was on Wednesday (11/23) due to holiday schedule --iHD per nephro while inpatient. --Continue [name], Sensipar --Pt was set up in a different outpatient dialysis center due to covid pos   Hyperkalemia, recurrent --K+ mildly elevated in between dialysis sessions --Veltassa PRN, per nephro --iHD for potassium removal  PAD Patient states that he follows with Dr. Ruby Cola Amalia Hailey, podiatry) --right toes felt cold and appeared dark red.  Right great toe reported to be painful with pressure --US arterial ABI obtained, read as "Abnormal distal bilateral lower extremity arterial waveforms consistent with arterial occlusive disease of indeterminate severity." --further eval per outpatient follow-up with vascular surgery --resume home statin  GERD/history of Barrett's esophagus --cont PPI  Essential hypertension --clonidine d/c'ed since pt takes it as PRN which puts him at risk for rebound hypertension.   --Pt was not taking amlodipine PTA. --cont home Lopressor and enalapril.     Discharge Diagnoses:  Principal Problem:   Pneumonia due to COVID-19 virus Active Problems:   COPD (chronic obstructive pulmonary disease) ([place])   Acute hypoxemic respiratory failure (H[facility])   ESRD (end stage renal disease) ([place])   Essential hypertension   CAP (community acquired pneumonia)   Hypoalbuminemia due to protein-calorie malnutrition ([place])   Macrocytic anemia   PAD (peripheral artery disease) (HCC)   GERD (gastroesophageal reflux disease)   Mixed hyperlipidemia   30 Day Unplanned Readmission Risk Score    Flowsheet Row ED to  Hosp-Admission (Current) from 07/03/2021 in [facility] (1A)  30 Day Unplanned Readmission Risk Score (%) 19.63 Filed at 07/08/2021 0801       This score is the patient's risk of an unplanned readmission within 30 days of being discharged  (0 -100%). The score is based on dignosis, age, lab data, medications, orders, and past utilization.   Low:  0-14.9   Medium: 15-21.9   High: 22-29.9   Extreme: 30 and above         Discharge Instructions:  Allergies as of 07/08/2021   No Known Allergies      Medication List     STOP taking these medications    amLODipine 10 MG tablet Commonly known as: NORVASC   cloNIDine 0.3 MG tablet Commonly known as: CATAPRES       TAKE these medications    albuterol 108 (90 Base) MCG/ACT inhaler Commonly known as: VENTOLIN HFA Inhale 2 puffs into the lungs every 6 (six) hours as needed for wheezing or shortness of breath.   cinacalcet 60 MG tablet Commonly known as: SENSIPAR Take 60 mg by mouth daily.   enalapril 20 MG tablet Commonly known as: VASOTEC Take 20 mg by mouth 2 (two) times daily.   guaiFENesin-dextromethorphan 100-10 MG/5ML syrup Commonly known as: ROBITUSSIN DM Take 10 mLs by mouth every 4 (four) hours as needed for up to 7 days for cough.   meloxicam 15 MG tablet Commonly known as: MOBIC Take 1 tablet (15 mg total) by mouth daily as needed for pain.   metoprolol tartrate 100 MG tablet Commonly known as: LOPRESSOR Take 100 mg by mouth 2 (two) times daily.   minoxidil 2.5 MG tablet Commonly known as: LONITEN Take by mouth 2 (two) times daily.   pantoprazole 40 MG tablet Commonly known as: PROTONIX Take 40 mg by mouth daily.   pravastatin 20 MG tablet Commonly known as: PRAVACHOL Take 20 mg by mouth daily.   sevelamer carbonate 800 MG tablet Commonly known as: RENVELA Take by mouth See admin instructions. Take 2400 mg by mouth three times daily, 1600 mg with snacks         Follow-up Information     Voora, [name], MD Follow up in 1 week(s).   Specialty: Nephrology Why: Office will contact Physician regarding F/U Appt. Contact information: [place] [address] [phone]                 No Known  Allergies   The results of significant diagnostics from this hospitalization (including imaging, microbiology, ancillary and laboratory) are listed below for reference.   Consultations:   Procedures/Studies: US Venous Img Lower Unilateral Right (DVT)  Result Date: 07/04/2021 CLINICAL DATA:  Pain and swelling EXAM: Right LOWER EXTREMITY VENOUS DOPPLER ULTRASOUND TECHNIQUE: Gray-scale sonography with compression, as well as color and duplex ultrasound, were performed to evaluate the deep venous system(s) from the level of the common femoral vein through the popliteal and proximal calf veins. COMPARISON:  None. FINDINGS: VENOUS Normal compressibility of the common femoral, superficial femoral, and popliteal veins, as well as the visualized calf veins. Visualized portions of profunda femoral vein and great saphenous vein unremarkable. No filling defects to suggest DVT on grayscale or color Doppler imaging. Doppler waveforms show normal direction of venous flow, normal respiratory plasticity and response to augmentation. Limited views of the contralateral common femoral vein are unremarkable. OTHER There are enlarged lymph nodes with fatty hilum in the right inguinal  region. Limitations: none IMPRESSION: There are no signs of deep venous thrombosis in the right lower extremity. Electronically Signed   By: Elmer Picker M.D.   On: 07/04/2021 09:53   US ARTERIAL ABI (SCREENING LOWER EXTREMITY)  Result Date: 07/07/2021 CLINICAL DATA:  right lower extremity rest pain, skin color changes. Diabetes, hypertension. EXAM: NONINVASIVE PHYSIOLOGIC VASCULAR STUDY OF BILATERAL LOWER EXTREMITIES TECHNIQUE: Evaluation of both lower extremities were performed at rest, including calculation of ankle-brachial indices with single level Doppler, pressure and pulse volume recording. COMPARISON:  None. FINDINGS: Right ABI:  1.38 Left ABI:  Non calculable due to vascular noncompressibility Right Lower Extremity:  Monophasic distal right posterior tibial arterial waveform. Markedly attenuated dorsalis pedis waveform. Left Lower Extremity: Attenuated distal posterior tibial and dorsalis pedis waveforms. IMPRESSION: Abnormal distal bilateral lower extremity arterial waveforms consistent with arterial occlusive disease of indeterminate severity. ABIs are considered nondiagnostic secondary to vascular noncompressibility presumably related to medial calcification. If further evaluation is needed, consider segmental studies and/or MRA runoff. Electronically Signed   By: Lucrezia Europe M.D.   On: 07/07/2021 16:46   DG Chest Port 1 View  Result Date: 07/03/2021 CLINICAL DATA:  Question sepsis. EXAM: PORTABLE CHEST 1 VIEW COMPARISON:  12/12/2020 FINDINGS: Ill-defined airspace density in the left mid lung with mild progression. Progressive left lower lobe consolidation since the prior study. Small left effusion Progressive right lower lobe consolidation and right effusion. Heart size upper normal.  No definite heart failure. Stent in the left upper arm. IMPRESSION: Progressive bilateral airspace disease and bilateral effusions. This may be due to pneumonia. Heart failure considered less likely. Underlying mass lesion not excluded. Given the history of possible sepsis, this is likely pneumonia. Follow-up chest CT recommended if these areas do not show clearing. Electronically Signed   By: Franchot Gallo M.D.   On: 07/03/2021 16:49      Labs: BNP (last 3 results) No results for input(s): BNP in the last 8760 hours. Basic Metabolic Panel: Recent Labs  Lab 07/04/21 0641 07/04/21 1235 07/05/21 0611 07/06/21 0610 07/07/21 0435 07/08/21 0253  NA 140  --  135 134* 132* 134*  K 6.3* 5.4* 4.3 5.4* 5.6* 5.2*  CL 102  --  95* 95* 93* 96*  CO2 27  --  25 25 24 25   GLUCOSE 118*  --  130* 129* 110* 94  BUN 59*  --  51* 85* 101* 74*  CREATININE 10.59*  --  7.37* 8.67* 10.15* 7.69*  CALCIUM 8.0*  --  8.8* 7.9* 7.8* 7.4*  MG 2.3   --  2.0 2.2 2.2 2.0  PHOS 7.1*  --   --   --   --   --    Liver Function Tests: Recent Labs  Lab 07/03/21 1623 07/04/21 0641  AST 15 11*  ALT 12 9  ALKPHOS 81 67  BILITOT 1.2 0.7  PROT 7.4 6.3*  ALBUMIN 3.4* 2.7*   No results for input(s): LIPASE, AMYLASE in the last 168 hours. No results for input(s): AMMONIA in the last 168 hours. CBC: Recent Labs  Lab 07/03/21 1623 07/04/21 0641 07/05/21 0611 07/06/21 0610 07/07/21 0435 07/08/21 0253  WBC 9.4 7.5 9.7 7.3 7.0 5.6  NEUTROABS 8.1* 6.6  --   --   --   --   HGB 10.9* 9.2* 10.9* 10.2* 10.7* 10.3*  HCT 35.3* 29.4* 33.9* 32.0* 33.7* 31.3*  MCV 100.9* 102.4* 99.4 96.7 96.8 95.7  PLT 202 145* 221 182 187 165   Cardiac Enzymes:  No results for input(s): CKTOTAL, CKMB, CKMBINDEX, TROPONINI in the last 168 hours. BNP: Invalid input(s): POCBNP CBG: Recent Labs  Lab 07/07/21 0807 07/07/21 1224 07/07/21 1758 07/08/21 0016 07/08/21 0844  GLUCAP 111* 148* 104* 122* 69*   D-Dimer No results for input(s): DDIMER in the last 72 hours. Hgb A1c No results for input(s): HGBA1C in the last 72 hours. Lipid Profile No results for input(s): CHOL, HDL, LDLCALC, TRIG, CHOLHDL, LDLDIRECT in the last 72 hours. Thyroid function studies No results for input(s): TSH, T4TOTAL, T3FREE, THYROIDAB in the last 72 hours.  Invalid input(s): FREET3 Anemia work up No results for input(s): VITAMINB12, FOLATE, FERRITIN, TIBC, IRON, RETICCTPCT in the last 72 hours. Urinalysis No results found for: COLORURINE, APPEARANCEUR, [place], [place], GLUCOSEU, [place], [place], [place], PROTEINUR, UROBILINOGEN, NITRITE, LEUKOCYTESUR Sepsis Labs Invalid input(s): PROCALCITONIN,  WBC,  LACTICIDVEN Microbiology Recent Results (from the past 240 hour(s))  Resp Panel by RT-PCR (Flu A&B, Covid) Nasopharyngeal Swab     Status: Abnormal   Collection Time: 07/03/21  4:23 PM   Specimen: Nasopharyngeal Swab; Nasopharyngeal(NP) swabs in vial transport medium   Result Value Ref Range Status   SARS Coronavirus 2 by RT PCR POSITIVE (A) NEGATIVE Final    Comment: RESULT CALLED TO, READ BACK BY AND VERIFIED WITH: MAGGIE BARBER @ 1741 ON 07/03/2021 BY CAF (NOTE) SARS-CoV-2 target nucleic acids are DETECTED.  The SARS-CoV-2 RNA is generally detectable in upper respiratory specimens during the acute phase of infection. Positive results are indicative of the presence of the identified virus, but do not rule out bacterial infection or co-infection with other pathogens not detected by the test. Clinical correlation with patient history and other diagnostic information is necessary to determine patient infection status. The expected result is Negative.  Fact Sheet for Patients: EntrepreneurPulse.com.au  Fact Sheet for Healthcare Providers: IncredibleEmployment.be  This test is not yet approved or cleared by the [place] FDA and  has been authorized for detection and/or diagnosis of SARS-CoV-2 by FDA under an Emergency Use Authorization (EUA).  This EUA will remain in effect (meaning this tes t can be used) for the duration of  the COVID-19 declaration under Section 564(b)(1) of the Act, 21 U.S.C. section 360bbb-3(b)(1), unless the authorization is terminated or revoked sooner.     Influenza A by PCR NEGATIVE NEGATIVE Final   Influenza B by PCR NEGATIVE NEGATIVE Final    Comment: (NOTE) The Xpert Xpress SARS-CoV-2/FLU/RSV plus assay is intended as an aid in the diagnosis of influenza from Nasopharyngeal swab specimens and should not be used as a sole basis for treatment. Nasal washings and aspirates are unacceptable for Xpert Xpress SARS-CoV-2/FLU/RSV testing.  Fact Sheet for Patients: EntrepreneurPulse.com.au  Fact Sheet for Healthcare Providers: IncredibleEmployment.be  This test is not yet approved or cleared by the [place] FDA and has been authorized for  detection and/or diagnosis of SARS-CoV-2 by FDA under an Emergency Use Authorization (EUA). This EUA will remain in effect (meaning this test can be used) for the duration of the COVID-19 declaration under Section 564(b)(1) of the Act, 21 U.S.C. section 360bbb-3(b)(1), unless the authorization is terminated or revoked.  Performed at [facility], [address] [place], [place] [address]   Culture, blood (Routine x 2)     Status: None   Collection Time: 07/03/21  4:23 PM   Specimen: BLOOD  Result Value Ref Range Status   Specimen Description BLOOD RIGHT ANTECUBITAL  Final   Special Requests   Final    BOTTLES  DRAWN AEROBIC AND ANAEROBIC Blood Culture results may not be optimal due to an excessive volume of blood received in culture bottles   Culture   Final    NO GROWTH 5 DAYS Performed at [facility], [address] [place], [place] [address]    Report Status 07/08/2021 FINAL  Final  Culture, blood (Routine x 2)     Status: None   Collection Time: 07/03/21  4:23 PM   Specimen: BLOOD  Result Value Ref Range Status   Specimen Description BLOOD BLOOD RIGHT FOREARM  Final   Special Requests   Final    BOTTLES DRAWN AEROBIC AND ANAEROBIC Blood Culture results may not be optimal due to an excessive volume of blood received in culture bottles   Culture   Final    NO GROWTH 5 DAYS Performed at [facility], [address] [place], [place] [address]    Report Status 07/08/2021 FINAL  Final     Total time spend on discharging this patient, including the last patient exam, discussing the hospital stay, instructions for ongoing care as it relates to all pertinent caregivers, as well as preparing the medical discharge records, prescriptions, and/or referrals as applicable, is 35 minutes.    Enzo Bi, MD  [facility] 07/08/2021, 10:57 AM

## 2021-07-08 NOTE — Care Management Important Message (Signed)
Important Message  Patient Details  Name: Scott Castro MRN: [id number] Date of Birth: 1956-07-28   Medicare Important Message Given:  Other (see comment)  Pt. is in an isolation room and I tried a second time to reach him by phone [phone]) but no answer.   Juliann Pulse A [name] 07/08/2021, 1:17 PM

## 2021-07-08 NOTE — Progress Notes (Signed)
Blood pressure (!) 140/111, pulse 85, temperature 97.7 F (36.5 C), resp. rate 18, height 5\' 9"  (1.753 m), weight 71.9 kg, SpO2 95 %. IV removed site c/d/I d/c packet discussed with pt he verbalized understanding of teaching pt sent home will all of belongings and took an [place] home

## 2021-07-08 NOTE — Care Management Important Message (Signed)
Important Message  Patient Details  Name: Scott Castro MRN: [id number] Date of Birth: [birth year]   Medicare Important Message Given:  Other (see comment)  Patient is in an isolation room so I called his room [phone]) to review the Important Message from Medicare but there was no answer at this time. Will check back.    Juliann Pulse A Reece Fehnel 07/08/2021, 10:03 AM

## 2021-07-08 NOTE — TOC Progression Note (Signed)
Transition of Care [facility]) - Progression Note    Patient Details  Name: Scott Castro MRN: [id number] Date of Birth: 09/19/1955  [facility] CM/SW [place], RN Phone Number: 07/08/2021, 11:25 AM  Clinical Narrative:    The patient needs transportation home, Set up with Melburn Popper thru [place], ref number [phone] to arrive at 1 PM, call nurses desk 5 min prior to arrival        Expected Discharge Plan and Services           Expected Discharge Date: 07/08/21                                     Social Determinants of Health (SDOH) Interventions    Readmission Risk Interventions Readmission Risk Prevention Plan 04/10/2019  Transportation Screening Complete  PCP or Specialist Appt within 3-5 Days Complete  HRI or [place] Complete  Social Work Consult for [place] Planning/Counseling Complete  Medication Review Press photographer) Complete  Some recent data might be hidden

## 2021-07-08 NOTE — Progress Notes (Signed)
[facility]  ROUNDING NOTE   Subjective:   Scott Castro is a 65 y.o. male with past medical conditions including peripheral artery disease, hypertension, asthma, end-stage renal disease on hemodialysis.  Patient presents to the emergency room with complaints of weakness, chest congestion, dry cough and fever with increased shortness of breath for 3 to 4 days.  Patient was found to be COVID-positive with pneumonia and was admitted for Hypoxia [R09.02] Right leg swelling [M79.89] Fever, unspecified fever cause [R50.9] Community acquired pneumonia of right lung, unspecified part of lung [J18.9] COVID [U07.1] Pneumonia due to COVID-19 virus [U07.1, J12.82]  Patient is known to our practice from multiple hospital admissions.  He currently receives outpatient dialysis treatments at [facility] on a TTS schedule.  He is supervised by [facility] nephrology.    Patient seen resting quietly Alert and oriented Complains of fatigue Dialysis yesterday, tolerated well.    Objective:  Vital signs in last 24 hours:  Temp:  [97.7 F (36.5 C)-97.8 F (36.6 C)] 97.7 F (36.5 C) (11/30 0832) Pulse Rate:  [60-102] 82 (11/30 0832) Resp:  [8-22] 18 (11/30 0832) BP: (113-157)/(81-110) 142/99 (11/30 0832) SpO2:  [94 %-98 %] 98 % (11/30 0832) Weight:  [71.9 kg-72.5 kg] 71.9 kg (11/29 2145)  Weight change:  Filed Weights   07/04/21 1835 07/07/21 1850 07/07/21 2145  Weight: 70.3 kg 72.5 kg 71.9 kg    Intake/Output: I/O last 3 completed shifts: In: 45.5 [I.V.:45.5] Out: -499    Intake/Output this shift:  No intake/output data recorded.  Physical Exam: General: NAD, resting in bed  Head: Normocephalic, atraumatic. Moist oral mucosal membranes  Eyes: Anicteric  Lungs:  Diminished bases, normal effort  Heart: Regular rate and rhythm  Abdomen:  Soft, nontender  Extremities: No peripheral edema.  Neurologic: Nonfocal, moving all four extremities  Skin: No lesions  Access: Left aVF     Basic Metabolic Panel: Recent Labs  Lab 07/04/21 0641 07/04/21 1235 07/05/21 0611 07/06/21 0610 07/07/21 0435 07/08/21 0253  NA 140  --  135 134* 132* 134*  K 6.3* 5.4* 4.3 5.4* 5.6* 5.2*  CL 102  --  95* 95* 93* 96*  CO2 27  --  25 25 24 25   GLUCOSE 118*  --  130* 129* 110* 94  BUN 59*  --  51* 85* 101* 74*  CREATININE 10.59*  --  7.37* 8.67* 10.15* 7.69*  CALCIUM 8.0*  --  8.8* 7.9* 7.8* 7.4*  MG 2.3  --  2.0 2.2 2.2 2.0  PHOS 7.1*  --   --   --   --   --      Liver Function Tests: Recent Labs  Lab 07/03/21 1623 07/04/21 0641  AST 15 11*  ALT 12 9  ALKPHOS 81 67  BILITOT 1.2 0.7  PROT 7.4 6.3*  ALBUMIN 3.4* 2.7*    No results for input(s): LIPASE, AMYLASE in the last 168 hours. No results for input(s): AMMONIA in the last 168 hours.  CBC: Recent Labs  Lab 07/03/21 1623 07/04/21 0641 07/05/21 0611 07/06/21 0610 07/07/21 0435 07/08/21 0253  WBC 9.4 7.5 9.7 7.3 7.0 5.6  NEUTROABS 8.1* 6.6  --   --   --   --   HGB 10.9* 9.2* 10.9* 10.2* 10.7* 10.3*  HCT 35.3* 29.4* 33.9* 32.0* 33.7* 31.3*  MCV 100.9* 102.4* 99.4 96.7 96.8 95.7  PLT 202 145* 221 182 187 165     Cardiac Enzymes: No results for input(s): CKTOTAL, CKMB, CKMBINDEX,  TROPONINI in the last 168 hours.  BNP: Invalid input(s): POCBNP  CBG: Recent Labs  Lab 07/07/21 0807 07/07/21 1224 07/07/21 1758 07/08/21 0016 07/08/21 0844  GLUCAP 111* 148* 104* 122* 12*     Microbiology: Results for orders placed or performed during the hospital encounter of 07/03/21  Resp Panel by RT-PCR (Flu A&B, Covid) Nasopharyngeal Swab     Status: Abnormal   Collection Time: 07/03/21  4:23 PM   Specimen: Nasopharyngeal Swab; Nasopharyngeal(NP) swabs in vial transport medium  Result Value Ref Range Status   SARS Coronavirus 2 by RT PCR POSITIVE (A) NEGATIVE Final    Comment: RESULT CALLED TO, READ BACK BY AND VERIFIED WITH: MAGGIE BARBER @ 1741 ON 07/03/2021 BY CAF (NOTE) SARS-CoV-2 target nucleic  acids are DETECTED.  The SARS-CoV-2 RNA is generally detectable in upper respiratory specimens during the acute phase of infection. Positive results are indicative of the presence of the identified virus, but do not rule out bacterial infection or co-infection with other pathogens not detected by the test. Clinical correlation with patient history and other diagnostic information is necessary to determine patient infection status. The expected result is Negative.  Fact Sheet for Patients: EntrepreneurPulse.com.au  Fact Sheet for Healthcare Providers: IncredibleEmployment.be  This test is not yet approved or cleared by the [place] FDA and  has been authorized for detection and/or diagnosis of SARS-CoV-2 by FDA under an Emergency Use Authorization (EUA).  This EUA will remain in effect (meaning this tes t can be used) for the duration of  the COVID-19 declaration under Section 564(b)(1) of the Act, 21 U.S.C. section 360bbb-3(b)(1), unless the authorization is terminated or revoked sooner.     Influenza A by PCR NEGATIVE NEGATIVE Final   Influenza B by PCR NEGATIVE NEGATIVE Final    Comment: (NOTE) The Xpert Xpress SARS-CoV-2/FLU/RSV plus assay is intended as an aid in the diagnosis of influenza from Nasopharyngeal swab specimens and should not be used as a sole basis for treatment. Nasal washings and aspirates are unacceptable for Xpert Xpress SARS-CoV-2/FLU/RSV testing.  Fact Sheet for Patients: EntrepreneurPulse.com.au  Fact Sheet for Healthcare Providers: IncredibleEmployment.be  This test is not yet approved or cleared by the [place] FDA and has been authorized for detection and/or diagnosis of SARS-CoV-2 by FDA under an Emergency Use Authorization (EUA). This EUA will remain in effect (meaning this test can be used) for the duration of the COVID-19 declaration under Section 564(b)(1) of  the Act, 21 U.S.C. section 360bbb-3(b)(1), unless the authorization is terminated or revoked.  Performed at [facility], [address] [place], [place] [address]   Culture, blood (Routine x 2)     Status: None (Preliminary result)   Collection Time: 07/03/21  4:23 PM   Specimen: BLOOD  Result Value Ref Range Status   Specimen Description BLOOD RIGHT ANTECUBITAL  Final   Special Requests   Final    BOTTLES DRAWN AEROBIC AND ANAEROBIC Blood Culture results may not be optimal due to an excessive volume of blood received in culture bottles   Culture   Final    NO GROWTH 4 DAYS Performed at [facility], [address] [place],  [address]    Report Status PENDING  Incomplete  Culture, blood (Routine x 2)     Status: None (Preliminary result)   Collection Time: 07/03/21  4:23 PM   Specimen: BLOOD  Result Value Ref Range Status   Specimen Description BLOOD BLOOD RIGHT FOREARM  Final   Special  Requests   Final    BOTTLES DRAWN AEROBIC AND ANAEROBIC Blood Culture results may not be optimal due to an excessive volume of blood received in culture bottles   Culture   Final    NO GROWTH 4 DAYS Performed at [facility], [address] [place], [place] [address]    Report Status PENDING  Incomplete    Coagulation Studies: No results for input(s): LABPROT, INR in the last 72 hours.   Urinalysis: No results for input(s): COLORURINE, LABSPEC, PHURINE, GLUCOSEU, HGBUR, BILIRUBINUR, KETONESUR, PROTEINUR, UROBILINOGEN, NITRITE, LEUKOCYTESUR in the last 72 hours.  Invalid input(s): APPERANCEUR    Imaging: US ARTERIAL ABI (SCREENING LOWER EXTREMITY)  Result Date: 07/07/2021 CLINICAL DATA:  right lower extremity rest pain, skin color changes. Diabetes, hypertension. EXAM: NONINVASIVE PHYSIOLOGIC VASCULAR STUDY OF BILATERAL LOWER EXTREMITIES TECHNIQUE: Evaluation of both lower extremities were performed at rest, including calculation of ankle-brachial  indices with single level Doppler, pressure and pulse volume recording. COMPARISON:  None. FINDINGS: Right ABI:  1.38 Left ABI:  Non calculable due to vascular noncompressibility Right Lower Extremity: Monophasic distal right posterior tibial arterial waveform. Markedly attenuated dorsalis pedis waveform. Left Lower Extremity: Attenuated distal posterior tibial and dorsalis pedis waveforms. IMPRESSION: Abnormal distal bilateral lower extremity arterial waveforms consistent with arterial occlusive disease of indeterminate severity. ABIs are considered nondiagnostic secondary to vascular noncompressibility presumably related to medial calcification. If further evaluation is needed, consider segmental studies and/or MRA runoff. Electronically Signed   By: Lucrezia Europe M.D.   On: 07/07/2021 16:46     Medications:    sodium chloride 10 mL/hr at 07/07/21 1013   cefTRIAXone (ROCEPHIN)  IV      albuterol  2 puff Inhalation Q6H   vitamin C  500 mg Oral Daily   azithromycin  500 mg Oral Daily   Chlorhexidine Gluconate Cloth  6 each Topical Q0600   cinacalcet  60 mg Oral Q breakfast   dexamethasone  6 mg Oral Daily   dextromethorphan-guaiFENesin  1 tablet Oral BID   heparin injection (subcutaneous)  5,000 Units Subcutaneous Q8H   insulin aspart  0-9 Units Subcutaneous TID WC   metoprolol tartrate  100 mg Oral BID   pantoprazole  40 mg Oral Daily   patiromer  8.4 g Oral Once   sevelamer carbonate  2,400 mg Oral TID WC   zinc sulfate  220 mg Oral Daily   sodium chloride, acetaminophen, chlorpheniramine-HYDROcodone, guaiFENesin-dextromethorphan, ipratropium-albuterol, ondansetron **OR** ondansetron (ZOFRAN) IV  Assessment/ Plan:  Mr. JONTAY MASTON is a 65 y.o.  male with past medical conditions including peripheral artery disease, hypertension, asthma, end-stage renal disease on hemodialysis. Patient admitted for Hypoxia [R09.02] Right leg swelling [M79.89] Fever, unspecified fever cause  [R50.9] Community acquired pneumonia of right lung, unspecified part of lung [J18.9] COVID [U07.1] Pneumonia due to COVID-19 virus [U07.1, J12.82]  [facility] TTS [place]. AVF  End-stage renal disease with hyperkalemia on hemodialysis.  Will maintain outpatient schedule if possible.  Dialysis received yesterday, tolerated well. UF 0. Appreciate dialysis coordinator confirming outpatient treatments at [facility] while patient remains in isolation for Covid-19. Patient is aware of these arrangements.   2. Anemia of chronic kidney disease.  Lab Results  Component Value Date   HGB 10.3 (L) 07/08/2021  Will continue to monitor   3. Secondary Hyperparathyroidism:  Lab Results  Component Value Date   PTH 93 (H) 04/09/2019   CALCIUM 7.4 (L) 07/08/2021   PHOS 7.1 (H) 07/04/2021  Corrected calcium of 8.4 with elevated phosphorus.  Continue Sevelamer ordered with meals  4.  Hypertension with chronic kidney disease.  Home regimen includes amlodipine, enalapril, and metoprolol.  Currently receiving clonidine and metoprolol.  BP slightly elevated at 142/99  5.  COVID-19 pneumonia Found positive on 07/03/2021. Last dose of Remdesivir given today.   Continue supportive care Antibiotic treatment with azithromycin and ceftriaxone.   LOS: [place] 11/30/20229:52 AM

## 2021-07-13 ENCOUNTER — Telehealth: Payer: Self-pay | Admitting: Podiatry

## 2021-07-13 NOTE — Telephone Encounter (Signed)
Patient called stating he saw Dr Amalia Hailey about 2 weeks ago and that the patient was suppose to hear from vascular office Dr Amalia Hailey was gonna send over referral. Patient has not heard anything he is just following up. Patients phone number is [phone].

## 2021-07-14 ENCOUNTER — Ambulatory Visit (INDEPENDENT_AMBULATORY_CARE_PROVIDER_SITE_OTHER): Payer: Medicare Other

## 2021-07-14 ENCOUNTER — Other Ambulatory Visit: Payer: Self-pay

## 2021-07-14 DIAGNOSIS — I739 Peripheral vascular disease, unspecified: Secondary | ICD-10-CM | POA: Diagnosis not present

## 2021-07-14 DIAGNOSIS — I70221 Atherosclerosis of native arteries of extremities with rest pain, right leg: Secondary | ICD-10-CM

## 2021-07-17 ENCOUNTER — Telehealth (INDEPENDENT_AMBULATORY_CARE_PROVIDER_SITE_OTHER): Payer: Self-pay

## 2021-07-17 ENCOUNTER — Other Ambulatory Visit: Payer: Self-pay | Admitting: Podiatry

## 2021-07-17 DIAGNOSIS — I739 Peripheral vascular disease, unspecified: Secondary | ICD-10-CM

## 2021-07-17 DIAGNOSIS — I70221 Atherosclerosis of native arteries of extremities with rest pain, right leg: Secondary | ICD-10-CM

## 2021-07-17 NOTE — Progress Notes (Signed)
Patient needs follow-up appointment/referral with vascular.  Referral placed for vascular consult.  Thanks, Dr. Amalia Hailey

## 2021-07-17 NOTE — Telephone Encounter (Signed)
Pt called to see the results of his [name] on 12.6.82. Dr. Amalia Hailey ordered a lab only. Per Eulogio Ditch, NP, I advised that he would have to call the referring Dr.'s office to get results. If the referring Dr. would like for him to come in to see our providers, he will need to place a referral. Pt expressed lots of anger and stated that he was in constant pain. I again advised that because the referring Dr. Did not ask for a consult, the results would be given by Dr. Amalia Hailey. Nothing further needed at this time.

## 2021-07-17 NOTE — Progress Notes (Signed)
Abnormal ABIs

## 2021-07-17 NOTE — Telephone Encounter (Signed)
Pt had a Vascular U/S of his Rt LE  on 12/6 for ischemia but has not got the results the pt needs to schedule an appointment to come in for the results .

## 2021-07-18 ENCOUNTER — Inpatient Hospital Stay
Admission: EM | Admit: 2021-07-18 | Discharge: 2021-07-23 | DRG: 252 | Disposition: A | Discharge status: Home Health | Payer: Medicare Other | Attending: Internal Medicine | Admitting: Internal Medicine

## 2021-07-18 ENCOUNTER — Encounter: Payer: Self-pay | Admitting: Internal Medicine

## 2021-07-18 ENCOUNTER — Other Ambulatory Visit: Payer: Self-pay

## 2021-07-18 DIAGNOSIS — E876 Hypokalemia: Secondary | ICD-10-CM | POA: Diagnosis present

## 2021-07-18 DIAGNOSIS — K2270 Barrett's esophagus without dysplasia: Secondary | ICD-10-CM | POA: Diagnosis present

## 2021-07-18 DIAGNOSIS — N186 End stage renal disease: Secondary | ICD-10-CM | POA: Diagnosis present

## 2021-07-18 DIAGNOSIS — Z8616 Personal history of COVID-19: Secondary | ICD-10-CM | POA: Diagnosis not present

## 2021-07-18 DIAGNOSIS — Z992 Dependence on renal dialysis: Secondary | ICD-10-CM

## 2021-07-18 DIAGNOSIS — I4891 Unspecified atrial fibrillation: Secondary | ICD-10-CM | POA: Diagnosis present

## 2021-07-18 DIAGNOSIS — K219 Gastro-esophageal reflux disease without esophagitis: Secondary | ICD-10-CM | POA: Diagnosis present

## 2021-07-18 DIAGNOSIS — Z7722 Contact with and (suspected) exposure to environmental tobacco smoke (acute) (chronic): Secondary | ICD-10-CM | POA: Diagnosis present

## 2021-07-18 DIAGNOSIS — I70222 Atherosclerosis of native arteries of extremities with rest pain, left leg: Secondary | ICD-10-CM | POA: Diagnosis present

## 2021-07-18 DIAGNOSIS — I10 Essential (primary) hypertension: Secondary | ICD-10-CM | POA: Diagnosis present

## 2021-07-18 DIAGNOSIS — Z791 Long term (current) use of non-steroidal anti-inflammatories (NSAID): Secondary | ICD-10-CM

## 2021-07-18 DIAGNOSIS — K22719 Barrett's esophagus with dysplasia, unspecified: Secondary | ICD-10-CM

## 2021-07-18 DIAGNOSIS — I998 Other disorder of circulatory system: Secondary | ICD-10-CM

## 2021-07-18 DIAGNOSIS — L97919 Non-pressure chronic ulcer of unspecified part of right lower leg with unspecified severity: Secondary | ICD-10-CM | POA: Diagnosis present

## 2021-07-18 DIAGNOSIS — Z8249 Family history of ischemic heart disease and other diseases of the circulatory system: Secondary | ICD-10-CM

## 2021-07-18 DIAGNOSIS — I480 Paroxysmal atrial fibrillation: Secondary | ICD-10-CM | POA: Diagnosis present

## 2021-07-18 DIAGNOSIS — J449 Chronic obstructive pulmonary disease, unspecified: Secondary | ICD-10-CM | POA: Diagnosis present

## 2021-07-18 DIAGNOSIS — D631 Anemia in chronic kidney disease: Secondary | ICD-10-CM | POA: Diagnosis present

## 2021-07-18 DIAGNOSIS — Z7982 Long term (current) use of aspirin: Secondary | ICD-10-CM | POA: Diagnosis not present

## 2021-07-18 DIAGNOSIS — E875 Hyperkalemia: Secondary | ICD-10-CM | POA: Diagnosis not present

## 2021-07-18 DIAGNOSIS — Z20822 Contact with and (suspected) exposure to covid-19: Secondary | ICD-10-CM | POA: Diagnosis present

## 2021-07-18 DIAGNOSIS — Z8701 Personal history of pneumonia (recurrent): Secondary | ICD-10-CM

## 2021-07-18 DIAGNOSIS — Z79899 Other long term (current) drug therapy: Secondary | ICD-10-CM

## 2021-07-18 DIAGNOSIS — I120 Hypertensive chronic kidney disease with stage 5 chronic kidney disease or end stage renal disease: Secondary | ICD-10-CM | POA: Diagnosis present

## 2021-07-18 DIAGNOSIS — N2581 Secondary hyperparathyroidism of renal origin: Secondary | ICD-10-CM | POA: Diagnosis present

## 2021-07-18 DIAGNOSIS — I70235 Atherosclerosis of native arteries of right leg with ulceration of other part of foot: Secondary | ICD-10-CM | POA: Diagnosis not present

## 2021-07-18 DIAGNOSIS — I70221 Atherosclerosis of native arteries of extremities with rest pain, right leg: Principal | ICD-10-CM | POA: Diagnosis present

## 2021-07-18 DIAGNOSIS — I739 Peripheral vascular disease, unspecified: Secondary | ICD-10-CM

## 2021-07-18 LAB — CBC WITH DIFFERENTIAL/PLATELET
Abs Immature Granulocytes: 0.02 10*3/uL (ref 0.00–0.07)
Basophils Absolute: 0 10*3/uL (ref 0.0–0.1)
Basophils Relative: 1 %
Eosinophils Absolute: 0.1 10*3/uL (ref 0.0–0.5)
Eosinophils Relative: 1 %
HCT: 35.3 % — ABNORMAL LOW (ref 39.0–52.0)
Hemoglobin: 10.9 g/dL — ABNORMAL LOW (ref 13.0–17.0)
Immature Granulocytes: 0 %
Lymphocytes Relative: 17 %
Lymphs Abs: 1.1 10*3/uL (ref 0.7–4.0)
MCH: 31.2 pg (ref 26.0–34.0)
MCHC: 30.9 g/dL (ref 30.0–36.0)
MCV: 101.1 fL — ABNORMAL HIGH (ref 80.0–100.0)
Monocytes Absolute: 0.8 10*3/uL (ref 0.1–1.0)
Monocytes Relative: 13 %
Neutro Abs: 4.5 10*3/uL (ref 1.7–7.7)
Neutrophils Relative %: 68 %
Platelets: 190 10*3/uL (ref 150–400)
RBC: 3.49 MIL/uL — ABNORMAL LOW (ref 4.22–5.81)
RDW: 15.8 % — ABNORMAL HIGH (ref 11.5–15.5)
WBC: 6.6 10*3/uL (ref 4.0–10.5)
nRBC: 0 % (ref 0.0–0.2)

## 2021-07-18 LAB — APTT: aPTT: 31 seconds (ref 24–36)

## 2021-07-18 LAB — BASIC METABOLIC PANEL
Anion gap: 12 (ref 5–15)
BUN: 17 mg/dL (ref 8–23)
CO2: 30 mmol/L (ref 22–32)
Calcium: 8.5 mg/dL — ABNORMAL LOW (ref 8.9–10.3)
Chloride: 97 mmol/L — ABNORMAL LOW (ref 98–111)
Creatinine, Ser: 3.74 mg/dL — ABNORMAL HIGH (ref 0.61–1.24)
GFR, Estimated: 17 mL/min — ABNORMAL LOW (ref >60)
Glucose, Bld: 73 mg/dL (ref 70–99)
Potassium: 3.4 mmol/L — ABNORMAL LOW (ref 3.5–5.1)
Sodium: 139 mmol/L (ref 135–145)

## 2021-07-18 LAB — SEDIMENTATION RATE: Sed Rate: 44 mm/hr — ABNORMAL HIGH (ref 0–20)

## 2021-07-18 LAB — TROPONIN I (HIGH SENSITIVITY): Troponin I (High Sensitivity): 45 ng/L — ABNORMAL HIGH (ref <18)

## 2021-07-18 LAB — TYPE AND SCREEN
ABO/RH(D): O POS
Antibody Screen: NEGATIVE

## 2021-07-18 LAB — FOLATE: Folate: 8 ng/mL (ref >5.9)

## 2021-07-18 LAB — RETICULOCYTES
Immature Retic Fract: 17.5 % — ABNORMAL HIGH (ref 2.3–15.9)
RBC.: 2.93 MIL/uL — ABNORMAL LOW (ref 4.22–5.81)
Retic Count, Absolute: 25.2 10*3/uL (ref 19.0–186.0)
Retic Ct Pct: 0.9 % (ref 0.4–3.1)

## 2021-07-18 LAB — IRON AND TIBC
Iron: 36 ug/dL — ABNORMAL LOW (ref 45–182)
Saturation Ratios: 16 % — ABNORMAL LOW (ref 17.9–39.5)
TIBC: 228 ug/dL — ABNORMAL LOW (ref 250–450)
UIBC: 192 ug/dL

## 2021-07-18 LAB — RESP PANEL BY RT-PCR (FLU A&B, COVID) ARPGX2
Influenza A by PCR: NEGATIVE
Influenza B by PCR: NEGATIVE
SARS Coronavirus 2 by RT PCR: NEGATIVE

## 2021-07-18 LAB — PROTIME-INR
INR: 1.1 (ref 0.8–1.2)
Prothrombin Time: 14.5 seconds (ref 11.4–15.2)

## 2021-07-18 LAB — BRAIN NATRIURETIC PEPTIDE: B Natriuretic Peptide: 2175 pg/mL — ABNORMAL HIGH (ref 0.0–100.0)

## 2021-07-18 LAB — LACTIC ACID, PLASMA
Lactic Acid, Venous: 1.1 mmol/L (ref 0.5–1.9)
Lactic Acid, Venous: 1.2 mmol/L (ref 0.5–1.9)

## 2021-07-18 LAB — FERRITIN: Ferritin: 1129 ng/mL — ABNORMAL HIGH (ref 24–336)

## 2021-07-18 MED ORDER — ONDANSETRON HCL 4 MG PO TABS: 4.0000 mg | ORAL_TABLET | Freq: Four times a day (QID) | ORAL | Status: DC | PRN

## 2021-07-18 MED ORDER — POTASSIUM CHLORIDE CRYS ER 20 MEQ PO TBCR
20.0000 meq | EXTENDED_RELEASE_TABLET | Freq: Once | ORAL | Status: AC
  Administered 2021-07-18: 20 meq via ORAL
  Filled 2021-07-18: qty 1

## 2021-07-18 MED ORDER — METOPROLOL TARTRATE 50 MG PO TABS: 100.0000 mg | ORAL_TABLET | Freq: Two times a day (BID) | ORAL | Status: DC

## 2021-07-18 MED ORDER — ALBUTEROL SULFATE (2.5 MG/3ML) 0.083% IN NEBU: 2.5000 mg | INHALATION_SOLUTION | RESPIRATORY_TRACT | Status: DC | PRN

## 2021-07-18 MED ORDER — ACETAMINOPHEN 325 MG PO TABS
650.0000 mg | ORAL_TABLET | Freq: Four times a day (QID) | ORAL | Status: DC | PRN
  Administered 2021-07-23: 650 mg via ORAL
  Filled 2021-07-18: qty 2

## 2021-07-18 MED ORDER — MINOXIDIL 2.5 MG PO TABS: 2.5000 mg | ORAL_TABLET | Freq: Two times a day (BID) | ORAL | Status: DC

## 2021-07-18 MED ORDER — ACETAMINOPHEN 650 MG RE SUPP
650.0000 mg | Freq: Four times a day (QID) | RECTAL | Status: DC | PRN
  Filled 2021-07-18: qty 1

## 2021-07-18 MED ORDER — LACTATED RINGERS IV SOLN: INTRAVENOUS | Status: DC

## 2021-07-18 MED ORDER — HYDROMORPHONE HCL 1 MG/ML IJ SOLN
1.0000 mg | Freq: Once | INTRAMUSCULAR | Status: AC
  Administered 2021-07-18: 1 mg via INTRAVENOUS
  Filled 2021-07-18: qty 1

## 2021-07-18 MED ORDER — ONDANSETRON HCL 4 MG/2ML IJ SOLN: 4.0000 mg | Freq: Four times a day (QID) | INTRAMUSCULAR | Status: DC | PRN

## 2021-07-18 MED ORDER — HEPARIN (PORCINE) 25000 UT/250ML-% IV SOLN
1450.0000 [IU]/h | INTRAVENOUS | Status: DC
Start: 2021-07-18 — End: 2021-07-19
  Administered 2021-07-18: 1200 [IU]/h via INTRAVENOUS
  Administered 2021-07-19: 1450 [IU]/h via INTRAVENOUS
  Filled 2021-07-18 (×2): qty 250

## 2021-07-18 MED ORDER — HEPARIN BOLUS VIA INFUSION
4500.0000 [IU] | Freq: Once | INTRAVENOUS | Status: AC
  Administered 2021-07-18: 4500 [IU] via INTRAVENOUS
  Filled 2021-07-18: qty 4500

## 2021-07-18 MED ORDER — DILTIAZEM HCL 25 MG/5ML IV SOLN
10.0000 mg | Freq: Once | INTRAVENOUS | Status: AC
  Administered 2021-07-18: 10 mg via INTRAVENOUS
  Filled 2021-07-18: qty 5

## 2021-07-18 MED ORDER — SODIUM CHLORIDE 0.9% FLUSH
3.0000 mL | Freq: Two times a day (BID) | INTRAVENOUS | Status: DC
  Administered 2021-07-18 – 2021-07-23 (×10): 3 mL via INTRAVENOUS

## 2021-07-18 MED ORDER — SODIUM CHLORIDE 0.9 % IV BOLUS: 1000.0000 mL | Freq: Once | INTRAVENOUS | Status: DC

## 2021-07-18 MED ORDER — HYDROMORPHONE HCL 1 MG/ML IJ SOLN
1.0000 mg | Freq: Four times a day (QID) | INTRAMUSCULAR | Status: DC | PRN
  Administered 2021-07-19 (×2): 1 mg via INTRAVENOUS
  Filled 2021-07-18 (×2): qty 1

## 2021-07-18 MED ORDER — METOPROLOL TARTRATE 50 MG PO TABS
50.0000 mg | ORAL_TABLET | Freq: Two times a day (BID) | ORAL | Status: DC
  Administered 2021-07-18 – 2021-07-22 (×8): 50 mg via ORAL
  Filled 2021-07-18 (×8): qty 1

## 2021-07-18 MED ORDER — CINACALCET HCL 30 MG PO TABS
60.0000 mg | ORAL_TABLET | Freq: Every day | ORAL | Status: DC
  Administered 2021-07-19 – 2021-07-23 (×3): 60 mg via ORAL
  Filled 2021-07-18 (×5): qty 2

## 2021-07-18 MED ORDER — PANTOPRAZOLE SODIUM 40 MG IV SOLR
40.0000 mg | Freq: Two times a day (BID) | INTRAVENOUS | Status: DC
  Administered 2021-07-18 – 2021-07-21 (×7): 40 mg via INTRAVENOUS
  Filled 2021-07-18 (×7): qty 40

## 2021-07-18 MED ORDER — HYDROCODONE-ACETAMINOPHEN 5-325 MG PO TABS: 1.0000 | ORAL_TABLET | ORAL | Status: DC | PRN

## 2021-07-18 MED ORDER — HYDRALAZINE HCL 20 MG/ML IJ SOLN: 5.0000 mg | Freq: Four times a day (QID) | INTRAMUSCULAR | Status: DC | PRN

## 2021-07-18 MED ORDER — ALBUTEROL SULFATE HFA 108 (90 BASE) MCG/ACT IN AERS: 2.0000 | INHALATION_SPRAY | Freq: Four times a day (QID) | RESPIRATORY_TRACT | Status: DC | PRN

## 2021-07-18 MED ORDER — SEVELAMER CARBONATE 800 MG PO TABS
800.0000 mg | ORAL_TABLET | Freq: Three times a day (TID) | ORAL | Status: DC
  Administered 2021-07-19 – 2021-07-23 (×10): 800 mg via ORAL
  Filled 2021-07-18 (×11): qty 1

## 2021-07-18 NOTE — H&P (Signed)
History and Physical    Scott Castro IEP:[id number] DOB: [birth year] DOA: 07/18/2021  PCP: Franciso Bend, MD    Patient coming from:  Home    Chief Complaint:  Right foot pain.    HPI:  Scott Castro is a 65 y.o. male seen in ed with complaints of right foot pain and has abnormal ABI 4 days ago and he is a dialysis.Pt is here for Critical limb ischemia with pain in the great toe, right first MTP.  Patient has a history of PAD but has never smoked in his home life.  Not particularly sure in his case for his PAD etiology is.  Patient states that his pain has been going on for 3 months initially he thought it was issues he went to his primary care he tried different inserts.  He went to podiatry, had ABIs and were abnormal.  Chart review does not say any history of A. fib however patient has had a history of atrial fibrillation for several years and has been taking metoprolol but does not have any anticoagulation on board.  Discussed with patient that currently with his limb ischemia requiring heparin that benefits in both ways because atrial fibrillation puts him at high risk for having a stroke.  Patient was last admitted on 07/03/2021 with complaints of shortness of breath and diagnosed with pneumonia due to COVID-19, hypoxic respiratory failure.  During that visit patient had right lower extremity swelling and bilateral palpable pulses with Doppler.  ABI and TBI were ordered by podiatrist on follow-up with vascular was noted in the op note.  Venous Doppler was negative for DVT in the right lower leg then.  Pt has past medical history of end-stage renal disease on hemodialysis Tuesday Thursday and Saturday, peripheral arterial disease, hypertension, Barrett's esophagus, pneumonia due to COVID. ED Course:  Vitals:   07/18/21 1619 07/18/21 1620 07/18/21 1751  BP: (!) 150/95  112/76  Pulse: 85  86  Resp: 18  16  Temp: 98.9 F (37.2 C)    TempSrc: Oral    SpO2: 98%  99%  Weight:  71 kg 71  kg  Height:   5\' 9"  (1.753 m)  In the emergency room patient is alert awake oriented anxious vitals show A. fib RVR on the monitor.  Patient does not take his metoprolol today states that when he takes metoprolol lowers his blood pressure and is not able to have dialysis.  Patient therefore has not taken his doses of metoprolol today.  I have ordered diltiazem 10 mg IV push.  EKG printed shows A. fib RVR at 144 with artifact, LVH pattern, T wave inversion in lead to 3 and aVF, comparison shows these T wave inversions are new in leads II, III, aVF may be secondary to increased demand.  We will obtain troponin levels.  Patient did not have any chest pain during my exam.  Blood work today shows hypokalemia, creatinine of 3.74, CBC shows anemia which I suspect is from anemia of chronic kidney disease.  Respiratory panel is negative for flu and COVID.  Review of Systems:  Review of Systems  Constitutional: Negative.   HENT: Negative.    Eyes: Negative.   Respiratory: Negative.    Cardiovascular:  Positive for palpitations.  Gastrointestinal: Negative.   Musculoskeletal: Negative.        Pain in the first MTP and right foot.  Abnormal pulses.  Skin: Negative.   Neurological: Negative.   Endo/Heme/Allergies: Negative.   Psychiatric/Behavioral: Negative.  Past Medical History:  Diagnosis Date   Barrett's esophagus    Chronic kidney disease    CHRONIC\   Glomerulosclerosis, focal    Hyperparathyroidism due to renal insufficiency [facility])     Past Surgical History:  Procedure Laterality Date   AV FISTULA PLACEMENT     COLONOSCOPY N/A 03/22/2016   Procedure: COLONOSCOPY;  Surgeon: Manya Silvas, MD;  Location: [place];  Service: Endoscopy;  Laterality: N/A;   DG AV DIALYSIS GRAFT DECLOT OR     ESOPHAGOGASTRODUODENOSCOPY (EGD) WITH PROPOFOL  03/22/2016   Procedure: ESOPHAGOGASTRODUODENOSCOPY (EGD) WITH PROPOFOL;  Surgeon: Manya Silvas, MD;  Location: [place];  Service:  Endoscopy;;   [place] CATH       reports that he has never smoked. He has never used smokeless tobacco. He reports that he does not currently use alcohol. He reports that he does not use drugs.  No Known Allergies  Family History  Problem Relation Age of Onset   Heart disease Mother    Heart disease Father     Prior to Admission medications   Medication Sig Start Date End Date Taking? Authorizing Provider  albuterol (VENTOLIN HFA) 108 (90 Base) MCG/ACT inhaler Inhale 2 puffs into the lungs every 6 (six) hours as needed for wheezing or shortness of breath. 07/08/21   Enzo Bi, MD  cinacalcet (SENSIPAR) 60 MG tablet Take 60 mg by mouth daily.    [provider]  enalapril (VASOTEC) 20 MG tablet Take 20 mg by mouth 2 (two) times daily. Patient not taking: Reported on 07/03/2021    [provider]  meloxicam (MOBIC) 15 MG tablet Take 1 tablet (15 mg total) by mouth daily as needed for pain. 05/19/21   Edrick Kins, DPM  metoprolol (LOPRESSOR) 100 MG tablet Take 100 mg by mouth 2 (two) times daily.    [provider]  minoxidil (LONITEN) 2.5 MG tablet Take by mouth 2 (two) times daily.    [provider]  pantoprazole (PROTONIX) 40 MG tablet Take 40 mg by mouth daily. 03/27/19   [provider]  pravastatin (PRAVACHOL) 20 MG tablet Take 20 mg by mouth daily. Patient not taking: Reported on 12/12/2020    [provider]  sevelamer carbonate (RENVELA) 800 MG tablet Take by mouth See admin instructions. Take 2400 mg by mouth three times daily, 1600 mg with snacks    [provider]    Physical Exam: Vitals:   07/18/21 1619 07/18/21 1620 07/18/21 1751  BP: (!) 150/95  112/76  Pulse: 85  86  Resp: 18  16  Temp: 98.9 F (37.2 C)    TempSrc: Oral    SpO2: 98%  99%  Weight:  71 kg 71 kg  Height:   5\' 9"  (1.753 m)   Physical Exam Vitals and nursing note  reviewed.  Constitutional:      General: He is not in acute distress.    Appearance: He is not ill-appearing or toxic-appearing.  HENT:     Head: Normocephalic and atraumatic.     Right Ear: External ear normal.     Left Ear: External ear normal.     Nose: Nose normal.     Mouth/Throat:     Mouth: Mucous membranes are moist.  Eyes:     Extraocular Movements: Extraocular movements intact.     Pupils: Pupils are equal, round, and reactive to light.  Cardiovascular:  Rate and Rhythm: Tachycardia present. Rhythm irregular.     Pulses:          Dorsalis pedis pulses are 1+ on the right side and 1+ on the left side.       Posterior tibial pulses are 0 on the right side and 1+ on the left side.     Heart sounds: Normal heart sounds.  Pulmonary:     Effort: Pulmonary effort is normal.     Breath sounds: Normal breath sounds.  Abdominal:     General: Bowel sounds are normal. There is no distension.     Palpations: Abdomen is soft. There is no mass.     Tenderness: There is no abdominal tenderness. There is no guarding.     Hernia: No hernia is present.  Musculoskeletal:     Right lower leg: Edema present.     Left lower leg: No edema.  Feet:     Right foot:     Skin integrity: Erythema, warmth and dry skin present.     Toenail Condition: Right toenails are abnormally thick and long.  Skin:    General: Skin is warm.  Neurological:     General: No focal deficit present.     Mental Status: He is alert and oriented to person, place, and time.     Cranial Nerves: Cranial nerves 2-12 are intact.     Sensory: Sensation is intact.     Motor: No weakness.  Psychiatric:        Attention and Perception: Attention normal.        Mood and Affect: Mood is anxious.        Speech: Speech normal.        Behavior: Behavior normal. Behavior is cooperative.        Thought Content: Thought content normal.     Labs on Admission: I have personally reviewed following labs and imaging  studies  No results for input(s): CKTOTAL, CKMB, TROPONINI in the last 72 hours. Lab Results  Component Value Date   WBC 6.6 07/18/2021   HGB 10.9 (L) 07/18/2021   HCT 35.3 (L) 07/18/2021   MCV 101.1 (H) 07/18/2021   PLT 190 07/18/2021    Recent Labs  Lab 07/18/21 1622  NA 139  K 3.4*  CL 97*  CO2 30  BUN 17  CREATININE 3.74*  CALCIUM 8.5*  GLUCOSE 73   No results found for: CHOL, HDL, LDLCALC, TRIG Lab Results  Component Value Date   DDIMER 2.21 (H) 07/04/2021   Invalid input(s): POCBNP   COVID-19 Labs No results for input(s): DDIMER, FERRITIN, LDH, CRP in the last 72 hours. Lab Results  Component Value Date   SARSCOV2NAA NEGATIVE 07/18/2021   SARSCOV2NAA POSITIVE (A) 07/03/2021   [facility] NEGATIVE 12/12/2020   [facility] NEGATIVE 09/17/2019    Radiological Exams on Admission:  07/14/2021 Vascular US ABI with /WO TBI: Summary:  Right: Resting right ankle-brachial index indicates moderate right lower  extremity arterial disease. The right toe-brachial index is abnormal.  Left: Resting left ankle-brachial index indicates noncompressible left  lower extremity arteries. The left toe-brachial index is abnormal.    See table(s) above for measurements and observations.  Electronically signed by Leotis Pain MD on 07/17/2021 at 9:03:08 AM.     EKG: Independently reviewed.  A. fib RVR 144 with T wave inversions in lead II and III with an aVF this is new to his previous EKG on 07/03/2021 admission.  Echocardiogram 03/2019: 1. The left ventricle has  normal systolic function with an ejection fraction of 60-65%. The cavity size was normal. Left ventricular diastolic parameters were normal. 2. The right ventricle has normal systolic function. The cavity was mildly enlarged. There is no increase in right ventricular wall thickness. 3. Left atrial size was mildly dilated. 4. Right atrial size was mildly dilated. 5. Trivial pericardial effusion is present. 6. The mitral  valve is grossly normal. 7. The tricuspid valve is grossly normal. 8. The aortic valve was not well visualized. Mild sclerosis of the aortic valve. 9. The aorta is normal unless otherwise noted. 10. The interatrial septum was not assessed.  Assessment/Plan: Principal Problem:   Lower limb ischemia Active Problems:   Atrial fibrillation with RVR (HCC)   Essential hypertension   GERD (gastroesophageal reflux disease)   COPD (chronic obstructive pulmonary disease) (HCC)   Barrett esophagus Lower limb ischemia:  Patient presenting with right foot and first MTP swelling erythema and pain found to have decreased pulses. Patient said to have a history of PAD and limb ischemia. Started on heparin drip. Vascular consulted per ED provider. As needed pain medication, elevation, rest, gentle hydration.   A. fib RVR: Patient in A. fib RVR in the emergency room.  Patient states he has paroxysmal off-and-on A. fib and does not take any blood thinners but he does take metoprolol which he has not taken prior to his dialysis.  States that his metoprolol lowers his blood pressure so much that he cannot have dialysis.  We will continue patient on heparin drip and patient given diltiazem for A. fib RVR and restarted metoprolol lower dose.  2D echocardiogram.  We will also add high intensity statin therapy.  Obtain baseline LFTs and CPK.  Abnormal EKG: We will obtain a 2D echocardiogram for any wall motion abnormalities however patient does have EKG changes of T wave inversions in leads II, III and aVF and also has a strong family history of heart disease therefore patient will need a cardiology consult for cardiology clearance and also a stress test.  A.m. team to request cardiology consult.   Essential hypertension: Blood pressure 104/67, pulse 95, temperature 98.9 F (37.2 C), temperature source Oral, resp. rate 17, height 5\' 9"  (1.753 m), weight 71 kg, SpO2 96 %. Blood pressure is low normal. We will  hold his home medications.  GERD/Barrett's esophagus: I have advised patient to refrain from using- Any NSAIDs, Toradol, Mobic, ibuprofen. IV PPI.  COPD: Patient carries a diagnosis of COPD, is a non-smoker. Most recent chest CT was done in April 2020 which showed calcified pleural plaques bilaterally due to asbestos related pleural disease and atelectasis.  Chart review shows patient has an upcoming CT chest ordered for 07/29/2021 by his pulmonologist..  CT scan also showed aortic atherosclerosis in addition to the left main and three-vessel CAD, which again I suspect warrants ischemic evaluation in this case.  Hypokalemia:  P.O. potassium replacement x1.  Anemia: Attribute to anemia of chronic kidney disease patient has been anemic since 2017.   Type and screen, IV PPI, Refrain/ AVOID Toradol, Mobic, Celebrex, NSAIDs, of any kind. We will however also get fecal occult stool card, as patient has a history of Barrett's and has been using NSAIDs.  DVT prophylaxis:  Heparin GTT  Code Status:  Full code  Family Communication:  cox,kathy (Relative)  [phone] (Mobile)   Disposition Plan:  Home  Consults called:  Vascular-EDMD-Dr. Trula Slade Cardiology-a.m. team.  Admission status: Inpatient   Para Skeans MD [facility] [phone]  How to contact the [facility] Attending or Consulting provider [place] or covering provider during after hours [place], for this patient.    Check the care team in [facility] and look for a) attending/consulting [facility] provider listed and b) the [facility] team listed Log into [email or web address] and use [place]'s universal password to access. If you do not have the password, please contact the hospital operator. Locate the [facility] provider you are looking for under [facility] and page to a number that you can be directly reached. If you still have difficulty reaching the provider, please page the [facility] (Director on Call) for the Hospitalists listed on amion for  assistance. [email or web address] Password [facility] 07/18/2021, 7:24 PM

## 2021-07-18 NOTE — H&P (View-Only) (Signed)
[facility] [place]  Patient name: Scott Castro MRN: [id number] DOB: 1956-04-04 Sex: male   REQUESTING PROVIDER:    ER   REASON FOR CONSULT:    Right foot pain  HISTORY OF PRESENT ILLNESS:   Scott Castro is a 65 y.o. male, who presented to the emergency department with a 103-month history of pain in his right great toe.  He recently had ABIs that were diminished and had a right great toe pressure of 0.  The patient was admitted at the end of November with COVID-pneumonia.  Patient suffers from end-stage renal disease.  He is status post kidney transplant but now dialyzes through a left upper arm access.  He does have a history of atrial fibrillation and was in RVR in the emergency department.  He also has hyperparathyroidism.  He is a non-smoker.  PAST MEDICAL HISTORY    Past Medical History:  Diagnosis Date   Barrett's esophagus    Chronic kidney disease    CHRONIC\   Glomerulosclerosis, focal    Hyperparathyroidism due to renal insufficiency ([place])      FAMILY HISTORY   No family history on file.  SOCIAL HISTORY:   Social History   Socioeconomic History   Marital status: Single    Spouse name: Not on file   Number of children: Not on file   Years of education: Not on file   Highest education level: Not on file  Occupational History   Not on file  Tobacco Use   Smoking status: Never   Smokeless tobacco: Never  Substance and Sexual Activity   Alcohol use: Not Currently    Comment: very little   Drug use: No   Sexual activity: Not on file  Other Topics Concern   Not on file  Social History Narrative   Not on file   Social Determinants of Health   Financial Resource Strain: Not on file  Food Insecurity: Not on file  Transportation Needs: Not on file  Physical Activity: Not on file  Stress: Not on file  Social Connections: Not on file  Intimate Partner Violence: Not on file    ALLERGIES:     No Known Allergies  CURRENT MEDICATIONS:    Current Facility-Administered Medications  Medication Dose Route Frequency Provider Last Rate Last Admin   HYDROmorphone (DILAUDID) injection 1 mg  1 mg Intravenous Once Carrie Mew, MD       sodium chloride 0.9 % bolus 1,000 mL  1,000 mL Intravenous Once Carrie Mew, MD       Current Outpatient Medications  Medication Sig Dispense Refill   albuterol (VENTOLIN HFA) 108 (90 Base) MCG/ACT inhaler Inhale 2 puffs into the lungs every 6 (six) hours as needed for wheezing or shortness of breath.     cinacalcet (SENSIPAR) 60 MG tablet Take 60 mg by mouth daily.     enalapril (VASOTEC) 20 MG tablet Take 20 mg by mouth 2 (two) times daily. (Patient not taking: Reported on 07/03/2021)     meloxicam (MOBIC) 15 MG tablet Take 1 tablet (15 mg total) by mouth daily as needed for pain.  1   metoprolol (LOPRESSOR) 100 MG tablet Take 100 mg by mouth 2 (two) times daily.     minoxidil (LONITEN) 2.5 MG tablet Take by mouth 2 (two) times daily.     pantoprazole (PROTONIX) 40 MG tablet Take 40 mg by mouth daily.     pravastatin (PRAVACHOL) 20 MG tablet Take 20 mg by  mouth daily. (Patient not taking: Reported on 12/12/2020)     sevelamer carbonate (RENVELA) 800 MG tablet Take by mouth See admin instructions. Take 2400 mg by mouth three times daily, 1600 mg with snacks      REVIEW OF SYSTEMS:   [X]  denotes positive finding, [ ]  denotes negative finding Cardiac  Comments:  Chest pain or chest pressure:    Shortness of breath upon exertion:    Short of breath when lying flat:    Irregular heart rhythm:        Vascular    Pain in calf, thigh, or hip brought on by ambulation:    Pain in feet at night that wakes you up from your sleep:  x   Blood clot in your veins:    Leg swelling:         Pulmonary    Oxygen at home:    Productive cough:     Wheezing:         Neurologic    Sudden weakness in arms or legs:     Sudden numbness in arms or  legs:     Sudden onset of difficulty speaking or slurred speech:    Temporary loss of vision in one eye:     Problems with dizziness:         Gastrointestinal    Blood in stool:      Vomited blood:         Genitourinary    Burning when urinating:     Blood in urine:        Psychiatric    Major depression:         Hematologic    Bleeding problems:    Problems with blood clotting too easily:        Skin    Rashes or ulcers:        Constitutional    Fever or chills:     PHYSICAL EXAM:   Vitals:   07/18/21 1619 07/18/21 1620 07/18/21 1751  BP: (!) 150/95  112/76  Pulse: 85  86  Resp: 18  16  Temp: 98.9 F (37.2 C)    TempSrc: Oral    SpO2: 98%  99%  Weight:  71 kg 71 kg  Height:   5\' 9"  (1.753 m)    GENERAL: The patient is a well-nourished male, in no acute distress. The vital signs are documented above. CARDIAC: There is a regular rate and rhythm.  VASCULAR: Palpable femoral pulses however the left is stronger than the right.  Nonpalpable pedal pulses PULMONARY: Nonlabored respirations ABDOMEN: Soft and non-tender with normal pitched bowel sounds.  MUSCULOSKELETAL: There are no major deformities or cyanosis. NEUROLOGIC: No focal weakness or paresthesias are detected. SKIN: See photo below PSYCHIATRIC: The patient has a normal affect.   STUDIES:   I have reviewed his ABIs with the following findings: ABI Findings:  +---------+------------------+-----+----------+--------+  Right    Rt Pressure (mmHg)IndexWaveform  Comment   +---------+------------------+-----+----------+--------+  Brachial 153                                        +---------+------------------+-----+----------+--------+  ATA      61                     monophasic.40       +---------+------------------+-----+----------+--------+  PTA      83  0.54 monophasic          +---------+------------------+-----+----------+--------+  Great Toe0                  0.00 Absent              +---------+------------------+-----+----------+--------+   +---------+------------------+-----+--------+-------+  Left     Lt Pressure (mmHg)IndexWaveformComment  +---------+------------------+-----+--------+-------+  ATA      250                    biphasicNC       +---------+------------------+-----+--------+-------+  PTA      120               0.78 biphasic         +---------+------------------+-----+--------+-------+  Great Toe65                0.42 Abnormal         +---------+------------------+-----+--------+-------+ ASSESSMENT and PLAN   Rest pain to the right foot: I discussed that the neck step is to proceed with angiography to define his anatomy and plan intervention.  We discussed the risks and benefits.  This would be through a left femoral approach with intervention of the right leg if indicated.  All questions were answered.  He will be n.p.o. after midnight with plans for angiography tomorrow.   Leia Alf, MD, FACS [facility] [phone] Pager [phone]

## 2021-07-18 NOTE — Consult Note (Signed)
ANTICOAGULATION CONSULT NOTE - Follow Up Consult  Pharmacy Consult for heparin Indication: PAD with right foot ischemia  No Known Allergies  Patient Measurements: Height: 5\' 9"  (175.3 cm) Weight: 71 kg (156 lb 8.4 oz) IBW/kg (Calculated) : 70.7 Heparin Dosing Weight: 71 kg  Vital Signs: Temp: 98.9 F (37.2 C) (12/10 1619) Temp Source: Oral (12/10 1619) BP: 112/76 (12/10 1751) Pulse Rate: 86 (12/10 1751)  Labs: Recent Labs    07/18/21 1622  HGB 10.9*  HCT 35.3*  PLT 190  CREATININE 3.74*    Estimated Creatinine Clearance: 19.4 mL/min (A) (by C-G formula based on SCr of 3.74 mg/dL (H)).   Medications:  No PTA anticoagulation or antiplatelet therapy  Assessment: 65 y.o. male with a history of PAD, COPD, ESRD on hemodialysis Tuesday Thursday Saturday who comes ED complaining of right great toe pain. Pharmacy has been consulted for heparin dosing for PAD with right foot ischemia.   Goal of Therapy:  Heparin level 0.3-0.7 units/ml Monitor platelets by anticoagulation protocol: Yes   Plan:  Give 4500 units bolus x 1 Start heparin infusion at 1200 units/hr Check anti-Xa level in 8 hours and daily while on heparin Continue to monitor H&H and platelets  Darnelle Bos, PharmD 07/18/2021,6:37 PM

## 2021-07-18 NOTE — ED Provider Notes (Signed)
[facility] Emergency Department Provider Note  ____________________________________________  Time seen: Approximately 6:45 PM  I have reviewed the triage vital signs and the nursing notes.   HISTORY  Chief Complaint Claudication    HPI Scott Castro is a 65 y.o. male with a history of PAD, COPD, ESRD on hemodialysis Tuesday Thursday Saturday who comes ED complaining of right great toe pain, 7 out of 10 at rest.  Worsening over the past few days.  Had an ultrasound done 4 days ago.  No chest pain or shortness of breath.  Toe pain is constant, worse with movement and contact and walking.  No alleviating factors, nonradiating.    Past Medical History:  Diagnosis Date   Barrett's esophagus    Chronic kidney disease    CHRONIC\   Glomerulosclerosis, focal    Hyperparathyroidism due to renal insufficiency [facility])      Patient Active Problem List   Diagnosis Date Noted   Pneumonia due to COVID-19 virus 07/03/2021   Hypoalbuminemia due to protein-calorie malnutrition ([place]) 07/03/2021   Macrocytic anemia 07/03/2021   PAD (peripheral artery disease) ([place]) 07/03/2021   GERD (gastroesophageal reflux disease) 07/03/2021   Mixed hyperlipidemia 07/03/2021   CAP (community acquired pneumonia) 12/13/2020   Sepsis ([place]) 12/12/2020   Bicipital tenosynovitis 12/04/2020   Localized, primary osteoarthritis of hand 12/04/2020   Hyperkalemia    Acute pulmonary edema ([place]) 09/18/2019   ESRD (end stage renal disease) ([place]) 09/18/2019   Hypertensive emergency 09/18/2019   Acute hypoxemic respiratory failure ([place]) 09/17/2019   COPD (chronic obstructive pulmonary disease) ([place]) 04/10/2019   COPD exacerbation ([place]) 04/09/2019   Nonintractable epilepsy with complex partial seizures ([place]) 08/20/2014   Essential hypertension 01/09/2013   Lung nodule, multiple 07/28/2012   Organ transplant candidate 02/04/2012   Arteriovenous fistula ([place]) 01/21/2012   At risk for dental  problems 01/21/2012   Barrett esophagus 01/21/2012   FSGS (focal segmental glomerulosclerosis) 01/21/2012   Kidney transplanted 01/21/2012     Past Surgical History:  Procedure Laterality Date   AV FISTULA PLACEMENT     COLONOSCOPY N/A 03/22/2016   Procedure: COLONOSCOPY;  Surgeon: Manya Silvas, MD;  Location: [facility] ENDOSCOPY;  Service: Endoscopy;  Laterality: N/A;   DG AV DIALYSIS GRAFT DECLOT OR     ESOPHAGOGASTRODUODENOSCOPY (EGD) WITH PROPOFOL  03/22/2016   Procedure: ESOPHAGOGASTRODUODENOSCOPY (EGD) WITH PROPOFOL;  Surgeon: Manya Silvas, MD;  Location: [facility] ENDOSCOPY;  Service: Endoscopy;;   [place] CATH       Prior to Admission medications   Medication Sig Start Date End Date Taking? Authorizing Provider  albuterol (VENTOLIN HFA) 108 (90 Base) MCG/ACT inhaler Inhale 2 puffs into the lungs every 6 (six) hours as needed for wheezing or shortness of breath. 07/08/21   Enzo Bi, MD  cinacalcet (SENSIPAR) 60 MG tablet Take 60 mg by mouth daily.    [provider]  enalapril (VASOTEC) 20 MG tablet Take 20 mg by mouth 2 (two) times daily. Patient not taking: Reported on 07/03/2021    [provider]  meloxicam (MOBIC) 15 MG tablet Take 1 tablet (15 mg total) by mouth daily as needed for pain. 05/19/21   Edrick Kins, DPM  metoprolol (LOPRESSOR) 100 MG tablet Take 100 mg by mouth 2 (two) times daily.    [provider]  minoxidil (LONITEN) 2.5 MG tablet Take by mouth 2 (two) times daily.    [provider]  pantoprazole (PROTONIX) 40 MG tablet Take 40 mg by mouth daily. 03/27/19   [provider]  pravastatin (PRAVACHOL) 20 MG tablet Take 20 mg by mouth daily. Patient not taking: Reported on 12/12/2020    [provider]  sevelamer carbonate (RENVELA) 800 MG tablet Take by mouth See admin instructions. Take 2400 mg by mouth three times daily, 1600 mg with snacks     [provider]     Allergies Patient has no known allergies.   No family history on file.  Social History Social History   Tobacco Use   Smoking status: Never   Smokeless tobacco: Never  Substance Use Topics   Alcohol use: Not Currently    Comment: very little   Drug use: No    Review of Systems  Constitutional:   No fever or chills.  ENT:   No sore throat. No rhinorrhea. Cardiovascular:   No chest pain or syncope. Respiratory:   No dyspnea or cough. Gastrointestinal:   Negative for abdominal pain, vomiting and diarrhea.  Musculoskeletal:   Right great toe pain. All other systems reviewed and are negative except as documented above in ROS and HPI.  ____________________________________________   PHYSICAL EXAM:  VITAL SIGNS: ED Triage Vitals  Enc Vitals Group     BP 07/18/21 1619 (!) 150/95     Pulse Rate 07/18/21 1619 85     Resp 07/18/21 1619 18     Temp 07/18/21 1619 98.9 F (37.2 C)     Temp Source 07/18/21 1619 Oral     SpO2 07/18/21 1619 98 %     Weight 07/18/21 1620 156 lb 8.4 oz (71 kg)     Height 07/18/21 1751 5\' 9"  (1.753 m)     Head Circumference --      Peak Flow --      Pain Score 07/18/21 1620 8     Pain Loc --      Pain Edu? --      Excl. in [place]? --     Vital signs reviewed, nursing assessments reviewed.   Constitutional:   Alert and oriented. Non-toxic appearance. Eyes:   Conjunctivae are normal. EOMI. PERRL. ENT      Head:   Normocephalic and atraumatic.      Nose:   Wearing a mask.      Mouth/Throat:   Wearing a mask.      Neck:   No meningismus. Full ROM. Hematological/Lymphatic/Immunilogical:   No cervical lymphadenopathy. Cardiovascular:   RRR.  Thready right dorsalis pedis pulse.  Purplish discoloration of of right foot toes 1 through 3 which are cool to the touch.  Capillary refill is very delayed in this area. Respiratory:   Normal respiratory effort without tachypnea/retractions. Breath sounds are clear and equal  bilaterally. No wheezes/rales/rhonchi. Gastrointestinal:   Soft and nontender. Non distended. There is no CVA tenderness.  No rebound, rigidity, or guarding. Genitourinary:   deferred Musculoskeletal:   Normal range of motion in all extremities. No joint effusions.  No lower extremity tenderness.  No edema.  Symmetric calf circumference.  Right foot toes 1 through 3 with vascular compromise as noted above, also each has 1 to 2 mm area of dry gangrene at the tip of the digit. Neurologic:   Normal speech and language.  Motor grossly intact. No acute focal neurologic deficits are appreciated.  Skin:    Skin is warm, dry with toe wounds as noted above, otherwise intact  ____________________________________________    LABS (  pertinent positives/negatives) (all labs ordered are listed, but only abnormal results are displayed) Labs Reviewed  CBC WITH DIFFERENTIAL/PLATELET - Abnormal; Notable for the following components:      Result Value   RBC 3.49 (*)    Hemoglobin 10.9 (*)    HCT 35.3 (*)    MCV 101.1 (*)    RDW 15.8 (*)    All other components within normal limits  BASIC METABOLIC PANEL - Abnormal; Notable for the following components:   Potassium 3.4 (*)    Chloride 97 (*)    Creatinine, Ser 3.74 (*)    Calcium 8.5 (*)    GFR, Estimated 17 (*)    All other components within normal limits  RESP PANEL BY RT-PCR (FLU A&B, COVID) ARPGX2  PROTIME-INR  APTT   ____________________________________________   EKG    ____________________________________________    RADIOLOGY  No results found.  ____________________________________________   PROCEDURES .Critical Care Performed by: Carrie Mew, MD Authorized by: Carrie Mew, MD   Critical care provider statement:    Critical care time (minutes):  33   Critical care time was exclusive of:  Separately billable procedures and treating other patients   Critical care was necessary to treat or prevent imminent or  life-threatening deterioration of the following conditions:  Circulatory failure   Critical care was time spent personally by me on the following activities:  Development of treatment plan with patient or surrogate, discussions with consultants, evaluation of patient's response to treatment, examination of patient, obtaining history from patient or surrogate, ordering and performing treatments and interventions, ordering and review of laboratory studies, ordering and review of radiographic studies, pulse oximetry, re-evaluation of patient's condition and review of old charts   Care discussed with: admitting provider    ____________________________________________    CLINICAL IMPRESSION / ASSESSMENT AND PLAN / ED COURSE  Medications ordered in the ED: Medications  heparin bolus via infusion 4,500 Units (has no administration in time range)  heparin ADULT infusion 100 units/mL (25000 units/258mL) (has no administration in time range)  HYDROmorphone (DILAUDID) injection 1 mg (1 mg Intravenous Given 07/18/21 1837)    Pertinent labs & imaging results that were available during my care of the patient were reviewed by me and considered in my medical decision making (see chart for details).  Scott Castro was evaluated in Emergency Department on 07/18/2021 for the symptoms described in the history of present illness. He was evaluated in the context of the global COVID-19 pandemic, which necessitated consideration that the patient might be at risk for infection with the SARS-CoV-2 virus that causes COVID-19. Institutional protocols and algorithms that pertain to the evaluation of patients at risk for COVID-19 are in a state of rapid change based on information released by regulatory bodies including the CDC and federal and state organizations. These policies and algorithms were followed during the patient's care in the ED.   Patient presents with resting pain in the right foot consistent with critical  limb ischemia.  Discussed with vascular surgery Dr. Trula Slade who agrees with hospitalization, heparin and will evaluate for revascularization procedure.      ____________________________________________   FINAL CLINICAL IMPRESSION(S) / ED DIAGNOSES    Final diagnoses:  Critical limb ischemia of right lower extremity with ulceration of foot ([place])  PAD (peripheral artery disease) ([place])  ESRD on hemodialysis [facility])     ED Discharge Orders     None       Portions of this note were generated with dragon dictation  software. Dictation errors may occur despite best attempts at proofreading.    Carrie Mew, MD 07/18/21 [phone]

## 2021-07-18 NOTE — ED Triage Notes (Signed)
Pt c/o right foot pain that started 2 months ago. Per pt, he has PAD and this is what is causing his pain. He had US done on Tuesday. Pt is a HD and has HD on TTHS. Pt is requesting to see a vascular MD.

## 2021-07-18 NOTE — Consult Note (Signed)
[facility] [place]  Patient name: Scott Castro MRN: [id number] DOB: 12-23-1955 Sex: male   REQUESTING PROVIDER:    ER   REASON FOR CONSULT:    Right foot pain  HISTORY OF PRESENT ILLNESS:   Scott Castro is a 65 y.o. male, who presented to the emergency department with a 58-month history of pain in his right great toe.  He recently had ABIs that were diminished and had a right great toe pressure of 0.  The patient was admitted at the end of November with COVID-pneumonia.  Patient suffers from end-stage renal disease.  He is status post kidney transplant but now dialyzes through a left upper arm access.  He does have a history of atrial fibrillation and was in RVR in the emergency department.  He also has hyperparathyroidism.  He is a non-smoker.  PAST MEDICAL HISTORY    Past Medical History:  Diagnosis Date   Barrett's esophagus    Chronic kidney disease    CHRONIC\   Glomerulosclerosis, focal    Hyperparathyroidism due to renal insufficiency ([place])      FAMILY HISTORY   No family history on file.  SOCIAL HISTORY:   Social History   Socioeconomic History   Marital status: Single    Spouse name: Not on file   Number of children: Not on file   Years of education: Not on file   Highest education level: Not on file  Occupational History   Not on file  Tobacco Use   Smoking status: Never   Smokeless tobacco: Never  Substance and Sexual Activity   Alcohol use: Not Currently    Comment: very little   Drug use: No   Sexual activity: Not on file  Other Topics Concern   Not on file  Social History Narrative   Not on file   Social Determinants of Health   Financial Resource Strain: Not on file  Food Insecurity: Not on file  Transportation Needs: Not on file  Physical Activity: Not on file  Stress: Not on file  Social Connections: Not on file  Intimate Partner Violence: Not on file    ALLERGIES:     No Known Allergies  CURRENT MEDICATIONS:    Current Facility-Administered Medications  Medication Dose Route Frequency Provider Last Rate Last Admin   HYDROmorphone (DILAUDID) injection 1 mg  1 mg Intravenous Once Carrie Mew, MD       sodium chloride 0.9 % bolus 1,000 mL  1,000 mL Intravenous Once Carrie Mew, MD       Current Outpatient Medications  Medication Sig Dispense Refill   albuterol (VENTOLIN HFA) 108 (90 Base) MCG/ACT inhaler Inhale 2 puffs into the lungs every 6 (six) hours as needed for wheezing or shortness of breath.     cinacalcet (SENSIPAR) 60 MG tablet Take 60 mg by mouth daily.     enalapril (VASOTEC) 20 MG tablet Take 20 mg by mouth 2 (two) times daily. (Patient not taking: Reported on 07/03/2021)     meloxicam (MOBIC) 15 MG tablet Take 1 tablet (15 mg total) by mouth daily as needed for pain.  1   metoprolol (LOPRESSOR) 100 MG tablet Take 100 mg by mouth 2 (two) times daily.     minoxidil (LONITEN) 2.5 MG tablet Take by mouth 2 (two) times daily.     pantoprazole (PROTONIX) 40 MG tablet Take 40 mg by mouth daily.     pravastatin (PRAVACHOL) 20 MG tablet Take 20 mg by  mouth daily. (Patient not taking: Reported on 12/12/2020)     sevelamer carbonate (RENVELA) 800 MG tablet Take by mouth See admin instructions. Take 2400 mg by mouth three times daily, 1600 mg with snacks      REVIEW OF SYSTEMS:   [X]  denotes positive finding, [ ]  denotes negative finding Cardiac  Comments:  Chest pain or chest pressure:    Shortness of breath upon exertion:    Short of breath when lying flat:    Irregular heart rhythm:        Vascular    Pain in calf, thigh, or hip brought on by ambulation:    Pain in feet at night that wakes you up from your sleep:  x   Blood clot in your veins:    Leg swelling:         Pulmonary    Oxygen at home:    Productive cough:     Wheezing:         Neurologic    Sudden weakness in arms or legs:     Sudden numbness in arms or  legs:     Sudden onset of difficulty speaking or slurred speech:    Temporary loss of vision in one eye:     Problems with dizziness:         Gastrointestinal    Blood in stool:      Vomited blood:         Genitourinary    Burning when urinating:     Blood in urine:        Psychiatric    Major depression:         Hematologic    Bleeding problems:    Problems with blood clotting too easily:        Skin    Rashes or ulcers:        Constitutional    Fever or chills:     PHYSICAL EXAM:   Vitals:   07/18/21 1619 07/18/21 1620 07/18/21 1751  BP: (!) 150/95  112/76  Pulse: 85  86  Resp: 18  16  Temp: 98.9 F (37.2 C)    TempSrc: Oral    SpO2: 98%  99%  Weight:  71 kg 71 kg  Height:   5\' 9"  (1.753 m)    GENERAL: The patient is a well-nourished male, in no acute distress. The vital signs are documented above. CARDIAC: There is a regular rate and rhythm.  VASCULAR: Palpable femoral pulses however the left is stronger than the right.  Nonpalpable pedal pulses PULMONARY: Nonlabored respirations ABDOMEN: Soft and non-tender with normal pitched bowel sounds.  MUSCULOSKELETAL: There are no major deformities or cyanosis. NEUROLOGIC: No focal weakness or paresthesias are detected. SKIN: See photo below PSYCHIATRIC: The patient has a normal affect.   STUDIES:   I have reviewed his ABIs with the following findings: ABI Findings:  +---------+------------------+-----+----------+--------+  Right    Rt Pressure (mmHg)IndexWaveform  Comment   +---------+------------------+-----+----------+--------+  Brachial 153                                        +---------+------------------+-----+----------+--------+  ATA      61                     monophasic.40       +---------+------------------+-----+----------+--------+  PTA      83  0.54 monophasic          +---------+------------------+-----+----------+--------+  Great Toe0                  0.00 Absent              +---------+------------------+-----+----------+--------+   +---------+------------------+-----+--------+-------+  Left     Lt Pressure (mmHg)IndexWaveformComment  +---------+------------------+-----+--------+-------+  ATA      250                    biphasicNC       +---------+------------------+-----+--------+-------+  PTA      120               0.78 biphasic         +---------+------------------+-----+--------+-------+  Great Toe65                0.42 Abnormal         +---------+------------------+-----+--------+-------+ ASSESSMENT and PLAN   Rest pain to the right foot: I discussed that the neck step is to proceed with angiography to define his anatomy and plan intervention.  We discussed the risks and benefits.  This would be through a left femoral approach with intervention of the right leg if indicated.  All questions were answered.  He will be n.p.o. after midnight with plans for angiography tomorrow.   Leia Alf, MD, FACS [facility] [phone] Pager [phone]

## 2021-07-19 LAB — CBC
HCT: 30.9 % — ABNORMAL LOW (ref 39.0–52.0)
Hemoglobin: 9.4 g/dL — ABNORMAL LOW (ref 13.0–17.0)
MCH: 31.5 pg (ref 26.0–34.0)
MCHC: 30.4 g/dL (ref 30.0–36.0)
MCV: 103.7 fL — ABNORMAL HIGH (ref 80.0–100.0)
Platelets: 162 10*3/uL (ref 150–400)
RBC: 2.98 MIL/uL — ABNORMAL LOW (ref 4.22–5.81)
RDW: 16 % — ABNORMAL HIGH (ref 11.5–15.5)
WBC: 5.3 10*3/uL (ref 4.0–10.5)
nRBC: 0 % (ref 0.0–0.2)

## 2021-07-19 LAB — COMPREHENSIVE METABOLIC PANEL WITH GFR
ALT: 10 U/L (ref 0–44)
AST: 10 U/L — ABNORMAL LOW (ref 15–41)
Albumin: 2.7 g/dL — ABNORMAL LOW (ref 3.5–5.0)
Alkaline Phosphatase: 76 U/L (ref 38–126)
Anion gap: 9 (ref 5–15)
BUN: 27 mg/dL — ABNORMAL HIGH (ref 8–23)
CO2: 30 mmol/L (ref 22–32)
Calcium: 8.3 mg/dL — ABNORMAL LOW (ref 8.9–10.3)
Chloride: 100 mmol/L (ref 98–111)
Creatinine, Ser: 5.26 mg/dL — ABNORMAL HIGH (ref 0.61–1.24)
GFR, Estimated: 11 mL/min — ABNORMAL LOW
Glucose, Bld: 85 mg/dL (ref 70–99)
Potassium: 5.1 mmol/L (ref 3.5–5.1)
Sodium: 139 mmol/L (ref 135–145)
Total Bilirubin: 1 mg/dL (ref 0.3–1.2)
Total Protein: 6.3 g/dL — ABNORMAL LOW (ref 6.5–8.1)

## 2021-07-19 LAB — HEPARIN LEVEL (UNFRACTIONATED): Heparin Unfractionated: 0.1 IU/mL — ABNORMAL LOW (ref 0.30–0.70)

## 2021-07-19 LAB — VITAMIN B12: Vitamin B-12: 288 pg/mL (ref 180–914)

## 2021-07-19 LAB — CK: Total CK: 14 U/L — ABNORMAL LOW (ref 49–397)

## 2021-07-19 MED ORDER — MORPHINE SULFATE (PF) 2 MG/ML IV SOLN
2.0000 mg | INTRAVENOUS | Status: DC | PRN
  Administered 2021-07-19 – 2021-07-20 (×5): 2 mg via INTRAVENOUS
  Filled 2021-07-19 (×5): qty 1

## 2021-07-19 MED ORDER — ORAL CARE MOUTH RINSE
15.0000 mL | Freq: Two times a day (BID) | OROMUCOSAL | Status: DC
  Administered 2021-07-19 – 2021-07-23 (×6): 15 mL via OROMUCOSAL

## 2021-07-19 MED ORDER — DILTIAZEM HCL 25 MG/5ML IV SOLN: 5.0000 mg | INTRAVENOUS | Status: DC | PRN

## 2021-07-19 MED ORDER — HYDROCODONE-ACETAMINOPHEN 5-325 MG PO TABS
1.0000 | ORAL_TABLET | Freq: Four times a day (QID) | ORAL | Status: DC | PRN
  Administered 2021-07-19: 1 via ORAL
  Filled 2021-07-19: qty 1

## 2021-07-19 MED ORDER — HEPARIN SODIUM (PORCINE) 5000 UNIT/ML IJ SOLN
5000.0000 [IU] | Freq: Three times a day (TID) | INTRAMUSCULAR | Status: DC
Start: 2021-07-19 — End: 2021-07-21
  Administered 2021-07-19 – 2021-07-21 (×4): 5000 [IU] via SUBCUTANEOUS
  Filled 2021-07-19 (×4): qty 1

## 2021-07-19 MED ORDER — HEPARIN BOLUS VIA INFUSION
2100.0000 [IU] | Freq: Once | INTRAVENOUS | Status: AC
  Administered 2021-07-19: 2100 [IU] via INTRAVENOUS
  Filled 2021-07-19: qty 2100

## 2021-07-19 MED ORDER — DILTIAZEM HCL-DEXTROSE 125-5 MG/125ML-% IV SOLN (PREMIX)
5.0000 mg/h | INTRAVENOUS | Status: DC
  Administered 2021-07-19 – 2021-07-22 (×5): 5 mg/h via INTRAVENOUS
  Filled 2021-07-19 (×4): qty 125

## 2021-07-19 NOTE — Consult Note (Addendum)
[facility] Cardiology  CARDIOLOGY CONSULT NOTE  Patient ID: Scott Castro MRN: [id number] DOB/AGE: [birth year] 66 y.o.  Admit date: 07/18/2021 Referring Physician Billie Ruddy Primary Physician [place] Primary Cardiologist  Reason for Consultation atrial fibrillation  HPI: 65 year old gentleman referred for evaluation of atrial fibrillation with rapid ventricular rate.  Patient has history of COPD and end-stage renal disease on chronic hemodialysis.  He was recently hospitalized 07/03/2021 with COVID-19 and hypoxic respiratory failure.  The patient has known peripheral vascular disease, recent normal ABIs several days ago.  He presents with right foot and great toe pain with erythema and edema, decreased pulses, consistent with critical limb ischemia.  Patient has been evaluated by vascular surgery and is scheduled for angiography in the a.m.  Patient noted to be tachycardic, ECG revealed atrial fibrillation at a rate of 144 bpm.  Patient denies chest pain or palpitations.  She was started on Cardizem drip with current heart rate 109 bpm.  Heparin bolus and drip as planned.  Review of systems complete and found to be negative unless listed above     Past Medical History:  Diagnosis Date   Barrett's esophagus    Chronic kidney disease    CHRONIC\   Glomerulosclerosis, focal    Hyperparathyroidism due to renal insufficiency [facility])     Past Surgical History:  Procedure Laterality Date   AV FISTULA PLACEMENT     COLONOSCOPY N/A 03/22/2016   Procedure: COLONOSCOPY;  Surgeon: Manya Silvas, MD;  Location: [facility] ENDOSCOPY;  Service: Endoscopy;  Laterality: N/A;   DG AV DIALYSIS GRAFT DECLOT OR     ESOPHAGOGASTRODUODENOSCOPY (EGD) WITH PROPOFOL  03/22/2016   Procedure: ESOPHAGOGASTRODUODENOSCOPY (EGD) WITH PROPOFOL;  Surgeon: Manya Silvas, MD;  Location: [place] ENDOSCOPY;  Service: Endoscopy;;   [place] CATH      (Not in a hospital  admission)  Social History   Socioeconomic History   Marital status: Single    Spouse name: Not on file   Number of children: Not on file   Years of education: Not on file   Highest education level: Not on file  Occupational History   Not on file  Tobacco Use   Smoking status: Never   Smokeless tobacco: Never  Substance and Sexual Activity   Alcohol use: Not Currently    Comment: very little   Drug use: No   Sexual activity: Not on file  Other Topics Concern   Not on file  Social History Narrative   Not on file   Social Determinants of Health   Financial Resource Strain: Not on file  Food Insecurity: Not on file  Transportation Needs: Not on file  Physical Activity: Not on file  Stress: Not on file  Social Connections: Not on file  Intimate Partner Violence: Not on file    Family History  Problem Relation Age of Onset   Heart disease Mother    Heart disease Father       Review of systems complete and found to be negative unless listed above      PHYSICAL EXAM  General: Well developed, well nourished, in no acute distress HEENT:  Normocephalic and atramatic Neck:  No JVD.  Lungs: Clear bilaterally to auscultation and percussion. Heart: HRRR . Normal S1 and S2 without gallops or murmurs.  Abdomen: Bowel sounds are positive, abdomen soft and non-tender  Msk:  Back normal, normal gait. Normal strength and tone for age. Extremities: No  clubbing, cyanosis or edema.   Neuro: Alert and oriented X 3. Psych:  Good affect, responds appropriately  Labs:   Lab Results  Component Value Date   WBC 5.3 07/19/2021   HGB 9.4 (L) 07/19/2021   HCT 30.9 (L) 07/19/2021   MCV 103.7 (H) 07/19/2021   PLT 162 07/19/2021    Recent Labs  Lab 07/19/21 0737  NA 139  K 5.1  CL 100  CO2 30  BUN 27*  CREATININE 5.26*  CALCIUM 8.3*  PROT 6.3*  BILITOT 1.0  ALKPHOS 76  ALT 10  AST 10*  GLUCOSE 85   Lab Results  Component Value Date   CKTOTAL 14 (L) 07/19/2021    CKMB 0.7 06/06/2012   TROPONINI 0.07 (H) 06/06/2012   No results found for: CHOL No results found for: HDL No results found for: LDLCALC No results found for: TRIG No results found for: CHOLHDL No results found for: LDLDIRECT    Radiology: US Venous Img Lower Unilateral Right (DVT)  Result Date: 07/04/2021 CLINICAL DATA:  Pain and swelling EXAM: Right LOWER EXTREMITY VENOUS DOPPLER ULTRASOUND TECHNIQUE: Gray-scale sonography with compression, as well as color and duplex ultrasound, were performed to evaluate the deep venous system(s) from the level of the common femoral vein through the popliteal and proximal calf veins. COMPARISON:  None. FINDINGS: VENOUS Normal compressibility of the common femoral, superficial femoral, and popliteal veins, as well as the visualized calf veins. Visualized portions of profunda femoral vein and great saphenous vein unremarkable. No filling defects to suggest DVT on grayscale or color Doppler imaging. Doppler waveforms show normal direction of venous flow, normal respiratory plasticity and response to augmentation. Limited views of the contralateral common femoral vein are unremarkable. OTHER There are enlarged lymph nodes with fatty hilum in the right inguinal region. Limitations: none IMPRESSION: There are no signs of deep venous thrombosis in the right lower extremity. Electronically Signed   By: Elmer Picker M.D.   On: 07/04/2021 09:53   US ARTERIAL ABI (SCREENING LOWER EXTREMITY)  Result Date: 07/07/2021 CLINICAL DATA:  right lower extremity rest pain, skin color changes. Diabetes, hypertension. EXAM: NONINVASIVE PHYSIOLOGIC VASCULAR STUDY OF BILATERAL LOWER EXTREMITIES TECHNIQUE: Evaluation of both lower extremities were performed at rest, including calculation of ankle-brachial indices with single level Doppler, pressure and pulse volume recording. COMPARISON:  None. FINDINGS: Right ABI:  1.38 Left ABI:  Non calculable due to vascular noncompressibility  Right Lower Extremity: Monophasic distal right posterior tibial arterial waveform. Markedly attenuated dorsalis pedis waveform. Left Lower Extremity: Attenuated distal posterior tibial and dorsalis pedis waveforms. IMPRESSION: Abnormal distal bilateral lower extremity arterial waveforms consistent with arterial occlusive disease of indeterminate severity. ABIs are considered nondiagnostic secondary to vascular noncompressibility presumably related to medial calcification. If further evaluation is needed, consider segmental studies and/or MRA runoff. Electronically Signed   By: Lucrezia Europe M.D.   On: 07/07/2021 16:46   DG Chest Port 1 View  Result Date: 07/03/2021 CLINICAL DATA:  Question sepsis. EXAM: PORTABLE CHEST 1 VIEW COMPARISON:  12/12/2020 FINDINGS: Ill-defined airspace density in the left mid lung with mild progression. Progressive left lower lobe consolidation since the prior study. Small left effusion Progressive right lower lobe consolidation and right effusion. Heart size upper normal.  No definite heart failure. Stent in the left upper arm. IMPRESSION: Progressive bilateral airspace disease and bilateral effusions. This may be due to pneumonia. Heart failure considered less likely. Underlying mass lesion not excluded. Given the history of possible sepsis, this is likely  pneumonia. Follow-up chest CT recommended if these areas do not show clearing. Electronically Signed   By: Franchot Gallo M.D.   On: 07/03/2021 16:49   VAS [name] ABI WITH/WO TBI  Result Date: 07/17/2021  LOWER EXTREMITY DOPPLER STUDY Patient Name:  ROYDEN BULMAN  Date of Exam:   07/14/2021 Medical Rec #: [id number]     Accession #:    [id number] Date of Birth: [birth year]     Patient Gender: M Patient Age:   28 years Exam Location:  [facility] Procedure:      VAS [name] ABI WITH/WO TBI Referring Phys: Leotis Pain --------------------------------------------------------------------------------  Indications: Rest pain, and Rt Great  Toe Pain.  Performing Technologist: Almira Coaster RVS  Examination Guidelines: A complete evaluation includes at minimum, Doppler waveform signals and systolic blood pressure reading at the level of bilateral brachial, anterior tibial, and posterior tibial arteries, when vessel segments are accessible. Bilateral testing is considered an integral part of a complete examination. Photoelectric Plethysmograph (PPG) waveforms and toe systolic pressure readings are included as required and additional duplex testing as needed. Limited examinations for reoccurring indications may be performed as noted.  ABI Findings: +---------+------------------+-----+----------+--------+ Right    Rt Pressure (mmHg)IndexWaveform  Comment  +---------+------------------+-----+----------+--------+ Brachial 153                                       +---------+------------------+-----+----------+--------+ ATA      61                     monophasic.40      +---------+------------------+-----+----------+--------+ PTA      83                0.54 monophasic         +---------+------------------+-----+----------+--------+ Great Toe0                 0.00 Absent             +---------+------------------+-----+----------+--------+ +---------+------------------+-----+--------+-------+ Left     Lt Pressure (mmHg)IndexWaveformComment +---------+------------------+-----+--------+-------+ ATA      250                    biphasicNC      +---------+------------------+-----+--------+-------+ PTA      120               0.78 biphasic        +---------+------------------+-----+--------+-------+ Great Toe65                0.42 Abnormal        +---------+------------------+-----+--------+-------+ +-------+-----------+-----------+------------+------------+ ABI/TBIToday's ABIToday's TBIPrevious ABIPrevious TBI +-------+-----------+-----------+------------+------------+ Right  .54        0                                    +-------+-----------+-----------+------------+------------+ Left   >1.0 [place]    .42                                 +-------+-----------+-----------+------------+------------+  Summary: Right: Resting right ankle-brachial index indicates moderate right lower extremity arterial disease. The right toe-brachial index is abnormal. Left: Resting left ankle-brachial index indicates noncompressible left lower extremity arteries. The left toe-brachial index is abnormal.  *See table(s) above for measurements and observations.  Electronically signed  by Leotis Pain MD on 07/17/2021 at 9:03:08 AM.    Final     EKG: Atrial fibrillation at a rate of 144 bpm  ASSESSMENT AND PLAN:   1.  Atrial fibrillation with rapid ventricular rate, likely exacerbated by right foot/great toe pain and critical limb ischemia, rate improved on Cardizem drip, patient currently asymptomatic, on heparin drip awaiting right lower extremity angiography in the a.m. 2.  Right lower extremity / critical limb ischemia awaiting angiography 3.  Mildly elevated high-sensitivity troponin , in the absence of chest pain, chronically elevated in the setting of end-stage renal disease, likely not due to acute coronary syndrome 4.  ESRD on chronic hemodialysis 5.  COPD 6.  Essential hypertension, blood pressure low, metoprolol tartrate and enalapril being held being held  Recommendations  1.  Agree with current therapy 2.  Continue Cardizem drip for now 3.  Continue heparin drip 4.  Resume metoprolol tartrate as blood pressure allows 5.  Proceed with angiography scheduled for a.m. without delay 6.  Defer cardiac diagnostics prior to angiography and potential revascularization right lower extremity which will take priority over ischemia work-up in the absence of chest pain or diagnostic elevation of troponin  Signed: [name] MD,PhD, [facility] 07/19/2021, 2:20 PM

## 2021-07-19 NOTE — Progress Notes (Signed)
PROGRESS NOTE    Scott Castro  OXB:[id number] DOB: 16-Dec-1955 DOA: 07/18/2021 PCP: Franciso Bend, MD  [id number]/[id number]   Assessment & Plan:   Principal Problem:   Lower limb ischemia Active Problems:   COPD (chronic obstructive pulmonary disease) (HCC)   Barrett esophagus   Essential hypertension   GERD (gastroesophageal reflux disease)   Atrial fibrillation with RVR ([place])   Scott Castro is a 65 y.o. male with medical history significant for hypertension, ESRD on HD, COPD/asthma due to secondhand smoke and peripheral arterial disease who presented to the emergency department for right foot pain.  Pt had an outpatient ABI that showed "Resting right ankle-brachial index indicates moderate right lower  extremity arterial disease. The right toe-brachial index is abnormal."  Podiatry Dr. Amalia Hailey had made an outpatient referral to Vascular surgery which had not happened yet.  Right foot pain presumed 2/2 PAD --started on heparin gtt on admission Plan: --vascular surgery consult today --d/c heparin gtt --Norco PRN for pain  Afib w RVR, POA --No documented dx of Afib on record, however, pt said he has paroxysmal off-and-on A. fib and does not take any blood thinners but he does take metoprolol. --started on heparin gtt and dilt gtt on admission. Plan: --cont dilt gtt --cont home metop --d/c heparin gtt (not indicated for just primary stroke prevention).  Hold oral anticoagulation for upcoming vascular intervention --cardiology consult today   COPD/asthma --not on home daily bronchodilators  ESRD on HD TTS --nephro consult for iHD --cont home Renvela  GERD/history of Barrett's esophagus --cont PPI  Essential hypertension, not currently active --BP has been soft --cont home metop for rate control --hold home enalapril     DVT prophylaxis: Heparin SQ Code Status: Full code  Family Communication:  Level of care: Progressive Dispo:   The patient is from: home Anticipated  d/c is to: home Anticipated d/c date is: 2-3 days Patient currently is not medically ready to d/c due to: Afib RVR, pending vascular intervention   Subjective and Interval History:  Pt reported right foot pain improved with opioids pain meds.     Objective: Vitals:   07/19/21 1030 07/19/21 1230 07/19/21 1300 07/19/21 1431  BP: 114/84 91/66 (!) 86/65 116/88  Pulse:   89 (!) 105  Resp: (!) 25 19 (!) 27 (!) 23  Temp:      TempSrc:      SpO2: 95%  98% 95%  Weight:      Height:        Intake/Output Summary (Last 24 hours) at 07/19/2021 1709 Last data filed at 07/19/2021 1304 Gross per 24 hour  Intake 36.85 ml  Output --  Net 36.85 ml   Filed Weights   07/18/21 1620 07/18/21 1751  Weight: 71 kg 71 kg    Examination:   Constitutional: NAD, AAOx3 HEENT: conjunctivae and lids normal, EOMI CV: No cyanosis.   RESP: normal respiratory effort Extremities: No effusions, edema in BLE, right toes erythematous and cool Neuro: II - XII grossly intact.   Psych: Normal mood and affect.  Appropriate judgement and reason   Data Reviewed: I have personally reviewed following labs and imaging studies  CBC: Recent Labs  Lab 07/18/21 1622 07/19/21 0737  WBC 6.6 5.3  NEUTROABS 4.5  --   HGB 10.9* 9.4*  HCT 35.3* 30.9*  MCV 101.1* 103.7*  PLT 190 683   Basic Metabolic Panel: Recent Labs  Lab 07/18/21 1622 07/19/21 0737  NA 139 139  K 3.4* 5.1  CL 97* 100  CO2 30 30  GLUCOSE 73 85  BUN 17 27*  CREATININE 3.74* 5.26*  CALCIUM 8.5* 8.3*   GFR: Estimated Creatinine Clearance: 13.8 mL/min (A) (by C-G formula based on SCr of 5.26 mg/dL (H)). Liver Function Tests: Recent Labs  Lab 07/19/21 0737  AST 10*  ALT 10  ALKPHOS 76  BILITOT 1.0  PROT 6.3*  ALBUMIN 2.7*   No results for input(s): LIPASE, AMYLASE in the last 168 hours. No results for input(s): AMMONIA in the last 168 hours. Coagulation Profile: Recent Labs  Lab 07/18/21 1833  INR 1.1   Cardiac  Enzymes: Recent Labs  Lab 07/19/21 0737  CKTOTAL 14*   BNP (last 3 results) No results for input(s): PROBNP in the last 8760 hours. HbA1C: No results for input(s): HGBA1C in the last 72 hours. CBG: No results for input(s): GLUCAP in the last 168 hours. Lipid Profile: No results for input(s): CHOL, HDL, LDLCALC, TRIG, CHOLHDL, LDLDIRECT in the last 72 hours. Thyroid Function Tests: No results for input(s): TSH, T4TOTAL, FREET4, T3FREE, THYROIDAB in the last 72 hours. Anemia Panel: Recent Labs    07/18/21 1622 07/18/21 2111  VITAMINB12  --  288  FOLATE 8.0  --   FERRITIN 1,129*  --   TIBC 228*  --   IRON 36*  --   RETICCTPCT 0.9  --    Sepsis Labs: Recent Labs  Lab 07/18/21 2111 07/18/21 2240  LATICACIDVEN 1.1 1.2    Recent Results (from the past 240 hour(s))  Resp Panel by RT-PCR (Flu A&B, Covid) Nasopharyngeal Swab     Status: None   Collection Time: 07/18/21  6:33 PM   Specimen: Nasopharyngeal Swab; Nasopharyngeal(NP) swabs in vial transport medium  Result Value Ref Range Status   SARS Coronavirus 2 by RT PCR NEGATIVE NEGATIVE Final    Comment: (NOTE) SARS-CoV-2 target nucleic acids are NOT DETECTED.  The SARS-CoV-2 RNA is generally detectable in upper respiratory specimens during the acute phase of infection. The lowest concentration of SARS-CoV-2 viral copies this assay can detect is 138 copies/mL. A negative result does not preclude SARS-Cov-2 infection and should not be used as the sole basis for treatment or other patient management decisions. A negative result may occur with  improper specimen collection/handling, submission of specimen other than nasopharyngeal swab, presence of viral mutation(s) within the areas targeted by this assay, and inadequate number of viral copies(<138 copies/mL). A negative result must be combined with clinical observations, patient history, and epidemiological information. The expected result is Negative.  Fact Sheet for  Patients:  EntrepreneurPulse.com.au  Fact Sheet for Healthcare Providers:  IncredibleEmployment.be  This test is no t yet approved or cleared by the [place] FDA and  has been authorized for detection and/or diagnosis of SARS-CoV-2 by FDA under an Emergency Use Authorization (EUA). This EUA will remain  in effect (meaning this test can be used) for the duration of the COVID-19 declaration under Section 564(b)(1) of the Act, 21 U.S.C.section 360bbb-3(b)(1), unless the authorization is terminated  or revoked sooner.       Influenza A by PCR NEGATIVE NEGATIVE Final   Influenza B by PCR NEGATIVE NEGATIVE Final    Comment: (NOTE) The Xpert Xpress SARS-CoV-2/FLU/RSV plus assay is intended as an aid in the diagnosis of influenza from Nasopharyngeal swab specimens and should not be used as a sole basis for treatment. Nasal washings and aspirates are unacceptable for Xpert Xpress SARS-CoV-2/FLU/RSV testing.  Fact Sheet for Patients: EntrepreneurPulse.com.au  Fact  Sheet for Healthcare Providers: IncredibleEmployment.be  This test is not yet approved or cleared by the [place] and has been authorized for detection and/or diagnosis of SARS-CoV-2 by FDA under an Emergency Use Authorization (EUA). This EUA will remain in effect (meaning this test can be used) for the duration of the COVID-19 declaration under Section 564(b)(1) of the Act, 21 U.S.C. section 360bbb-3(b)(1), unless the authorization is terminated or revoked.  Performed at [facility], [address] [place], [place] [address]       Radiology Studies: No results found.   Scheduled Meds:  cinacalcet  60 mg Oral Q supper   metoprolol tartrate  50 mg Oral BID   pantoprazole (PROTONIX) IV  40 mg Intravenous Q12H   sevelamer carbonate  800 mg Oral TID WC   sodium chloride flush  3 mL Intravenous Q12H   Continuous  Infusions:  diltiazem (CARDIZEM) infusion 5 mg/hr (07/19/21 1435)     LOS: 1 day     Enzo Bi, MD [facility] If 7PM-7AM, please contact night-coverage 07/19/2021, 5:09 PM

## 2021-07-19 NOTE — ED Notes (Signed)
Pt asleep and snoring, O2 sats dropping into high 80's periodically.  2LNC applied at this time.

## 2021-07-19 NOTE — Consult Note (Signed)
ANTICOAGULATION CONSULT NOTE - Follow Up Consult  Pharmacy Consult for heparin Indication: PAD with right foot ischemia  No Known Allergies  Patient Measurements: Height: 5\' 9"  (175.3 cm) Weight: 71 kg (156 lb 8.4 oz) IBW/kg (Calculated) : 70.7 Heparin Dosing Weight: 71 kg  Vital Signs: Temp: 98.9 F (37.2 C) (12/10 1619) Temp Source: Oral (12/10 1619) BP: 97/77 (12/11 0148) Pulse Rate: 122 (12/11 0148)  Labs: Recent Labs    07/18/21 1622 07/18/21 1833 07/19/21 0227  HGB 10.9*  --   --   HCT 35.3*  --   --   PLT 190  --   --   APTT  --  31  --   LABPROT  --  14.5  --   INR  --  1.1  --   HEPARINUNFRC  --   --  0.10*  CREATININE 3.74*  --   --   TROPONINIHS 45*  --   --      Estimated Creatinine Clearance: 19.4 mL/min (A) (by C-G formula based on SCr of 3.74 mg/dL (H)).   Medications:  No PTA anticoagulation or antiplatelet therapy  Assessment: 65 y.o. male with a history of PAD, COPD, ESRD on hemodialysis Tuesday Thursday Saturday who comes ED complaining of right great toe pain. Pharmacy has been consulted for heparin dosing for PAD with right foot ischemia.   Goal of Therapy:  Heparin level 0.3-0.7 units/ml Monitor platelets by anticoagulation protocol: Yes   Plan:  12/11:  HL @ 0227 = 0.1 Will order heparin 2100 units IV X 1 bolus and increase drip rate to 1450 units/hr.  Will recheck HL 8 hrs after rate change.   Annalia Metzger D, PharmD 07/19/2021,3:04 AM

## 2021-07-20 ENCOUNTER — Inpatient Hospital Stay
Admission: EM | Disposition: A | Discharge status: Home Health | Payer: Self-pay | Source: Home / Self Care | Attending: Hospitalist

## 2021-07-20 DIAGNOSIS — I70221 Atherosclerosis of native arteries of extremities with rest pain, right leg: Secondary | ICD-10-CM | POA: Diagnosis not present

## 2021-07-20 HISTORY — PX: LOWER EXTREMITY ANGIOGRAPHY: CATH118251

## 2021-07-20 LAB — CBC
HCT: 29.6 % — ABNORMAL LOW (ref 39.0–52.0)
Hemoglobin: 9.3 g/dL — ABNORMAL LOW (ref 13.0–17.0)
MCH: 31.7 pg (ref 26.0–34.0)
MCHC: 31.4 g/dL (ref 30.0–36.0)
MCV: 101 fL — ABNORMAL HIGH (ref 80.0–100.0)
Platelets: 172 10*3/uL (ref 150–400)
RBC: 2.93 MIL/uL — ABNORMAL LOW (ref 4.22–5.81)
RDW: 15.8 % — ABNORMAL HIGH (ref 11.5–15.5)
WBC: 7.3 10*3/uL (ref 4.0–10.5)
nRBC: 0 % (ref 0.0–0.2)

## 2021-07-20 LAB — BASIC METABOLIC PANEL
Anion gap: 11 (ref 5–15)
BUN: 51 mg/dL — ABNORMAL HIGH (ref 8–23)
CO2: 25 mmol/L (ref 22–32)
Calcium: 8.1 mg/dL — ABNORMAL LOW (ref 8.9–10.3)
Chloride: 98 mmol/L (ref 98–111)
Creatinine, Ser: 7.45 mg/dL — ABNORMAL HIGH (ref 0.61–1.24)
GFR, Estimated: 7 mL/min — ABNORMAL LOW (ref >60)
Glucose, Bld: 90 mg/dL (ref 70–99)
Potassium: 5.4 mmol/L — ABNORMAL HIGH (ref 3.5–5.1)
Sodium: 134 mmol/L — ABNORMAL LOW (ref 135–145)

## 2021-07-20 LAB — MAGNESIUM: Magnesium: 2 mg/dL (ref 1.7–2.4)

## 2021-07-20 SURGERY — LOWER EXTREMITY ANGIOGRAPHY
Anesthesia: Moderate Sedation | Laterality: Right

## 2021-07-20 MED ORDER — FENTANYL CITRATE PF 50 MCG/ML IJ SOSY
PREFILLED_SYRINGE | INTRAMUSCULAR | Status: AC
  Filled 2021-07-20: qty 2

## 2021-07-20 MED ORDER — JUVEN PO PACK
1.0000 | PACK | Freq: Two times a day (BID) | ORAL | Status: DC
  Administered 2021-07-21 – 2021-07-22 (×2): 1 via ORAL

## 2021-07-20 MED ORDER — ASPIRIN EC 81 MG PO TBEC
81.0000 mg | DELAYED_RELEASE_TABLET | Freq: Every day | ORAL | Status: DC
  Administered 2021-07-20 – 2021-07-21 (×2): 81 mg via ORAL
  Filled 2021-07-20 (×2): qty 1

## 2021-07-20 MED ORDER — RENA-VITE PO TABS
1.0000 | ORAL_TABLET | Freq: Every day | ORAL | Status: DC
  Administered 2021-07-20 – 2021-07-22 (×3): 1 via ORAL
  Filled 2021-07-20 (×4): qty 1

## 2021-07-20 MED ORDER — HEPARIN SODIUM (PORCINE) 1000 UNIT/ML IJ SOLN
INTRAMUSCULAR | Status: AC
  Filled 2021-07-20: qty 10

## 2021-07-20 MED ORDER — PROSOURCE PLUS PO LIQD
30.0000 mL | Freq: Two times a day (BID) | ORAL | Status: DC
  Administered 2021-07-22 – 2021-07-23 (×2): 30 mL via ORAL
  Filled 2021-07-20 (×6): qty 30

## 2021-07-20 MED ORDER — CEFAZOLIN SODIUM-DEXTROSE 2-4 GM/100ML-% IV SOLN
INTRAVENOUS | Status: AC
  Administered 2021-07-20: 2 g via INTRAVENOUS
  Filled 2021-07-20: qty 100

## 2021-07-20 MED ORDER — HYDROMORPHONE HCL 2 MG PO TABS
2.0000 mg | ORAL_TABLET | ORAL | Status: DC | PRN
  Administered 2021-07-20 – 2021-07-21 (×4): 2 mg via ORAL
  Filled 2021-07-20 (×4): qty 1

## 2021-07-20 MED ORDER — CEFAZOLIN SODIUM-DEXTROSE 2-4 GM/100ML-% IV SOLN
2.0000 g | INTRAVENOUS | Status: AC
  Filled 2021-07-20: qty 100

## 2021-07-20 MED ORDER — ATORVASTATIN CALCIUM 10 MG PO TABS
10.0000 mg | ORAL_TABLET | Freq: Every day | ORAL | Status: DC
  Administered 2021-07-20 – 2021-07-22 (×3): 10 mg via ORAL
  Filled 2021-07-20 (×4): qty 1

## 2021-07-20 MED ORDER — MIDAZOLAM HCL 5 MG/5ML IJ SOLN
INTRAMUSCULAR | Status: AC
  Filled 2021-07-20: qty 5

## 2021-07-20 MED ORDER — SODIUM CHLORIDE 0.9 % IV SOLN: INTRAVENOUS | Status: DC

## 2021-07-20 MED ORDER — HEPARIN SODIUM (PORCINE) 1000 UNIT/ML IJ SOLN
INTRAMUSCULAR | Status: DC | PRN
  Administered 2021-07-20: 5000 [IU] via INTRAVENOUS

## 2021-07-20 MED ORDER — CLOPIDOGREL BISULFATE 75 MG PO TABS
75.0000 mg | ORAL_TABLET | Freq: Every day | ORAL | Status: DC
  Administered 2021-07-20 – 2021-07-23 (×4): 75 mg via ORAL
  Filled 2021-07-20 (×4): qty 1

## 2021-07-20 MED ORDER — FENTANYL CITRATE (PF) 100 MCG/2ML IJ SOLN
INTRAMUSCULAR | Status: DC | PRN
  Administered 2021-07-20: 50 ug via INTRAVENOUS

## 2021-07-20 MED ORDER — HYDROCODONE-ACETAMINOPHEN 5-325 MG PO TABS: 1.0000 | ORAL_TABLET | Freq: Four times a day (QID) | ORAL | Status: DC | PRN

## 2021-07-20 MED ORDER — MIDAZOLAM HCL 2 MG/2ML IJ SOLN
INTRAMUSCULAR | Status: DC | PRN
  Administered 2021-07-20: 2 mg via INTRAVENOUS

## 2021-07-20 SURGICAL SUPPLY — 23 items
BALLN LUTONIX 018 5X60X130 (BALLOONS) ×2
BALLN ULTRVRSE 2.5X300X150 (BALLOONS) ×2
BALLN ULTRVRSE 3X150X150 (BALLOONS) ×2
BALLOON LUTONIX 018 5X60X130 (BALLOONS) IMPLANT
BALLOON ULTRVRSE 2.5X300X150 (BALLOONS) IMPLANT
BALLOON ULTRVRSE 3X150X150 (BALLOONS) IMPLANT
CATH ANGIO 5F PIGTAIL 65CM (CATHETERS) ×1 IMPLANT
CATH NAVICROSS ANGLED 135CM (MICROCATHETER) ×1 IMPLANT
CATH SEEKER .035X150CM (CATHETERS) ×1 IMPLANT
COVER PROBE U/S 5X48 (MISCELLANEOUS) ×1 IMPLANT
DEVICE STARCLOSE SE CLOSURE (Vascular Products) ×1 IMPLANT
DEVICE TORQUE .025-.038 (MISCELLANEOUS) ×1 IMPLANT
GAUZE SPONGE 4X4 12PLY STRL (GAUZE/BANDAGES/DRESSINGS) ×3 IMPLANT
GLIDEWIRE ADV .035X260CM (WIRE) ×1 IMPLANT
GUIDEWIRE PFTE-COATED .018X300 (WIRE) ×1 IMPLANT
KIT ENCORE 26 ADVANTAGE (KITS) ×1 IMPLANT
PACK ANGIOGRAPHY (CUSTOM PROCEDURE TRAY) ×2 IMPLANT
SHEATH BRITE TIP 5FRX11 (SHEATH) ×1 IMPLANT
SHEATH RAABE 6FRX70 (SHEATH) ×1 IMPLANT
SYR MEDRAD MARK 7 150ML (SYRINGE) ×1 IMPLANT
TUBING CONTRAST HIGH PRESS 72 (TUBING) ×1 IMPLANT
WIRE G V18X300CM (WIRE) ×2 IMPLANT
WIRE GUIDERIGHT .035X150 (WIRE) ×1 IMPLANT

## 2021-07-20 NOTE — Progress Notes (Signed)
PROGRESS NOTE    Scott Castro  QJJ:[id number] DOB: 01-22-1956 DOA: 07/18/2021 PCP: Franciso Bend, MD  [id number]   Assessment & Plan:   Principal Problem:   Lower limb ischemia Active Problems:   COPD (chronic obstructive pulmonary disease) ([place])   Barrett esophagus   Essential hypertension   GERD (gastroesophageal reflux disease)   Atrial fibrillation with RVR ([place])   Scott Castro is a 65 y.o. male with medical history significant for hypertension, ESRD on HD, COPD/asthma due to secondhand smoke and peripheral arterial disease who presented to the emergency department for right foot pain.  Pt had an outpatient ABI that showed "Resting right ankle-brachial index indicates moderate right lower  extremity arterial disease. The right toe-brachial index is abnormal."  Podiatry Dr. Amalia Hailey had made an outpatient referral to Vascular surgery which had not happened yet.  Right foot pain 2/2 PAD Plan: --Vascular intervention today --oral dilaudid for pain  Afib w RVR, POA --No documented dx of Afib on record, however, pt said he has paroxysmal off-and-on A. fib and does not take any blood thinners but he does take metoprolol. --started on heparin gtt and dilt gtt on admission.  Heparin gtt since d/c'ed since it's not indicated for just primary stroke prevention. --cardio consulted Plan: --cont dilt gtt --cont home metop --start oral anticoagulation after vascular intervention  COPD/asthma --not on home daily bronchodilators  ESRD on HD TTS --cont home Renvela --iHD per nephro  GERD/history of Barrett's esophagus --cont PPI  Essential hypertension, not currently active --BP has been soft --cont home metop for rate control --hold home enalapril   DVT prophylaxis: Heparin SQ Code Status: Full code  Family Communication:  Level of care: Progressive Dispo:   The patient is from: home Anticipated d/c is to: home Anticipated d/c date is: 1-2 days Patient currently is  not medically ready to d/c due to: Afib RVR, pending vascular intervention   Subjective and Interval History:  Pt went for vascular intervention today.   Objective: Vitals:   07/20/21 1655 07/20/21 1715 07/20/21 1745 07/20/21 1820  BP: 117/86 104/67 96/65 96/70   Pulse: 91 95 90 91  Resp: 15 13 19 20   Temp:      TempSrc:      SpO2: 92% 94% 97% 100%  Weight:      Height:        Intake/Output Summary (Last 24 hours) at 07/20/2021 1849 Last data filed at 07/20/2021 0631 Gross per 24 hour  Intake 78.38 ml  Output --  Net 78.38 ml   Filed Weights   07/18/21 1620 07/18/21 1751 07/20/21 1533  Weight: 71 kg 71 kg 71 kg    Examination:   Constitutional: NAD, AAOx3 HEENT: conjunctivae and lids normal, EOMI CV: No cyanosis.   RESP: normal respiratory effort Extremities: No effusions, edema in BLE.  Right toes erythematous Neuro: II - XII grossly intact.     Data Reviewed: I have personally reviewed following labs and imaging studies  CBC: Recent Labs  Lab 07/18/21 1622 07/19/21 0737 07/20/21 0658  WBC 6.6 5.3 7.3  NEUTROABS 4.5  --   --   HGB 10.9* 9.4* 9.3*  HCT 35.3* 30.9* 29.6*  MCV 101.1* 103.7* 101.0*  PLT 190 162 481   Basic Metabolic Panel: Recent Labs  Lab 07/18/21 1622 07/19/21 0737 07/20/21 0658  NA 139 139 134*  K 3.4* 5.1 5.4*  CL 97* 100 98  CO2 30 30 25   GLUCOSE 73 85 90  BUN 17  27* 51*  CREATININE 3.74* 5.26* 7.45*  CALCIUM 8.5* 8.3* 8.1*  MG  --   --  2.0   GFR: Estimated Creatinine Clearance: 9.8 mL/min (A) (by C-G formula based on SCr of 7.45 mg/dL (H)). Liver Function Tests: Recent Labs  Lab 07/19/21 0737  AST 10*  ALT 10  ALKPHOS 76  BILITOT 1.0  PROT 6.3*  ALBUMIN 2.7*   No results for input(s): LIPASE, AMYLASE in the last 168 hours. No results for input(s): AMMONIA in the last 168 hours. Coagulation Profile: Recent Labs  Lab 07/18/21 1833  INR 1.1   Cardiac Enzymes: Recent Labs  Lab 07/19/21 0737  CKTOTAL 14*    BNP (last 3 results) No results for input(s): PROBNP in the last 8760 hours. HbA1C: No results for input(s): HGBA1C in the last 72 hours. CBG: No results for input(s): GLUCAP in the last 168 hours. Lipid Profile: No results for input(s): CHOL, HDL, LDLCALC, TRIG, CHOLHDL, LDLDIRECT in the last 72 hours. Thyroid Function Tests: No results for input(s): TSH, T4TOTAL, FREET4, T3FREE, THYROIDAB in the last 72 hours. Anemia Panel: Recent Labs    07/18/21 1622 07/18/21 2111  VITAMINB12  --  288  FOLATE 8.0  --   FERRITIN 1,129*  --   TIBC 228*  --   IRON 36*  --   RETICCTPCT 0.9  --    Sepsis Labs: Recent Labs  Lab 07/18/21 2111 07/18/21 2240  LATICACIDVEN 1.1 1.2    Recent Results (from the past 240 hour(s))  Resp Panel by RT-PCR (Flu A&B, Covid) Nasopharyngeal Swab     Status: None   Collection Time: 07/18/21  6:33 PM   Specimen: Nasopharyngeal Swab; Nasopharyngeal(NP) swabs in vial transport medium  Result Value Ref Range Status   SARS Coronavirus 2 by RT PCR NEGATIVE NEGATIVE Final    Comment: (NOTE) SARS-CoV-2 target nucleic acids are NOT DETECTED.  The SARS-CoV-2 RNA is generally detectable in upper respiratory specimens during the acute phase of infection. The lowest concentration of SARS-CoV-2 viral copies this assay can detect is 138 copies/mL. A negative result does not preclude SARS-Cov-2 infection and should not be used as the sole basis for treatment or other patient management decisions. A negative result may occur with  improper specimen collection/handling, submission of specimen other than nasopharyngeal swab, presence of viral mutation(s) within the areas targeted by this assay, and inadequate number of viral copies(<138 copies/mL). A negative result must be combined with clinical observations, patient history, and epidemiological information. The expected result is Negative.  Fact Sheet for Patients:   EntrepreneurPulse.com.au  Fact Sheet for Healthcare Providers:  IncredibleEmployment.be  This test is no t yet approved or cleared by the [place] FDA and  has been authorized for detection and/or diagnosis of SARS-CoV-2 by FDA under an Emergency Use Authorization (EUA). This EUA will remain  in effect (meaning this test can be used) for the duration of the COVID-19 declaration under Section 564(b)(1) of the Act, 21 U.S.C.section 360bbb-3(b)(1), unless the authorization is terminated  or revoked sooner.       Influenza A by PCR NEGATIVE NEGATIVE Final   Influenza B by PCR NEGATIVE NEGATIVE Final    Comment: (NOTE) The Xpert Xpress SARS-CoV-2/FLU/RSV plus assay is intended as an aid in the diagnosis of influenza from Nasopharyngeal swab specimens and should not be used as a sole basis for treatment. Nasal washings and aspirates are unacceptable for Xpert Xpress SARS-CoV-2/FLU/RSV testing.  Fact Sheet for Patients: EntrepreneurPulse.com.au  Fact Sheet for  Healthcare Providers: IncredibleEmployment.be  This test is not yet approved or cleared by the [place] and has been authorized for detection and/or diagnosis of SARS-CoV-2 by FDA under an Emergency Use Authorization (EUA). This EUA will remain in effect (meaning this test can be used) for the duration of the COVID-19 declaration under Section 564(b)(1) of the Act, 21 U.S.C. section 360bbb-3(b)(1), unless the authorization is terminated or revoked.  Performed at [facility], [address], [place], [place] [address]       Radiology Studies: PERIPHERAL VASCULAR CATHETERIZATION  Result Date: 07/20/2021 See surgical note for result.    Scheduled Meds:  [START ON 07/21/2021] (feeding supplement) PROSource Plus  30 mL Oral BID BM   aspirin EC  81 mg Oral Daily   atorvastatin  10 mg Oral QHS   cinacalcet  60 mg Oral Q  supper   clopidogrel  75 mg Oral Daily   fentaNYL       heparin injection (subcutaneous)  5,000 Units Subcutaneous Q8H   heparin sodium (porcine)       mouth rinse  15 mL Mouth Rinse BID   metoprolol tartrate  50 mg Oral BID   midazolam       multivitamin  1 tablet Oral QHS   [START ON 07/21/2021] nutrition supplement (JUVEN)  1 packet Oral BID BM   pantoprazole (PROTONIX) IV  40 mg Intravenous Q12H   sevelamer carbonate  800 mg Oral TID WC   sodium chloride flush  3 mL Intravenous Q12H   Continuous Infusions:  diltiazem (CARDIZEM) infusion 5 mg/hr (07/20/21 0816)     LOS: 2 days     Enzo Bi, MD [facility] If 7PM-7AM, please contact night-coverage 07/20/2021, 6:49 PM

## 2021-07-20 NOTE — Interval H&P Note (Signed)
History and Physical Interval Note:  07/20/2021 3:42 PM  Scott Castro  has presented today for surgery, with the diagnosis of ischemic leg.  The various methods of treatment have been discussed with the patient and family. After consideration of risks, benefits and other options for treatment, the patient has consented to  Procedure(s): Lower Extremity Angiography (Right) as a surgical intervention.  The patient's history has been reviewed, patient examined, no change in status, stable for surgery.  I have reviewed the patient's chart and labs.  Questions were answered to the patient's satisfaction.     Leotis Pain

## 2021-07-20 NOTE — Clinical Social Work Note (Signed)
  Transition of Care (TOC) Screening Note   Patient Details  Name: Scott Castro Date of Birth: 09/12/1955   Transition of Care [facility]) CM/SW Contact:    Eileen Stanford, LCSW Phone Number: 07/20/2021, 9:54 AM    Transition of Care Department [facility]) has reviewed patient and no TOC needs have been identified at this time. We will continue to monitor patient advancement through interdisciplinary progression rounds. If new patient transition needs arise, please place a TOC consult.

## 2021-07-20 NOTE — Progress Notes (Signed)
Initial Nutrition Assessment  DOCUMENTATION CODES:   Not applicable  INTERVENTION:   -Renal MVI daily -30 ml Prosource Plus BID, each supplement provides 100 kcals and 15 grams protein -1 packet Juven BID, each packet provides 95 calories, 2.5 grams of protein (collagen), and 9.8 grams of carbohydrate (3 grams sugar); also contains 7 grams of L-arginine and L-glutamine, 300 mg vitamin C, 15 mg vitamin E, 1.2 mcg vitamin B-12, 9.5 mg zinc, 200 mg calcium, and 1.5 g  Calcium Beta-hydroxy-Beta-methylbutyrate to support wound healing   NUTRITION DIAGNOSIS:   Increased nutrient needs related to wound healing as evidenced by estimated needs.  GOAL:   Patient will meet greater than or equal to 90% of their needs  MONITOR:   PO intake, Supplement acceptance, Labs, Weight trends, Skin, I & O's  REASON FOR ASSESSMENT:   Consult Assessment of nutrition requirement/status  ASSESSMENT:   Scott Castro is a 65 y.o. male seen in ed with complaints of right foot pain and has abnormal ABI 4 days ago and he is a dialysis.Pt is here for Critical limb ischemia with pain in the great toe, right first MTP.  Patient has a history of PAD but has never smoked in his home life.  Not particularly sure in his case for his PAD etiology is.  Patient states that his pain has been going on for 3 months initially he thought it was issues he went to his primary care he tried different inserts.  He went to podiatry, had ABIs and were abnormal.  Chart review does not say any history of A. fib however patient has had a history of atrial fibrillation for several years and has been taking metoprolol but does not have any anticoagulation on board.  Discussed with patient that currently with his limb ischemia requiring heparin that benefits in both ways because atrial fibrillation puts him at high risk for having a stroke.  Pt admitted with lower limb ischemia and a-fib with RVR.   Reviewed I/O's: +112 ml x 24 hours and  +115 ml since admission  Spoke with pt at bedside, who reports feeling anxious for his procedure today. He reports having a good appetite and has been consuming all of his meals here; he is enjoying his food "despite being on a renal diet". He typically consumes 2 meals per day, which consists of a meat, starch and vegetable (both on HD days on non-HD days). He admits to treating himself to a cheeseburger and small french fries once a week. He shares his HD labs are usually good, but often forgers to take his renvela.   Per pt, his EDW is around 90 kg. He shares that his weight has increased due to being sedentary secondary to foot pain. Reviewed wt hx; wt has been stable over the past 7 months.    Discussed importance of good meal and supplement intake to promote healing.   Medications reviewed and include cardizem, sensipar and renvela.   Labs reviewed: Na: 134, K: 5.4 (inpatient orders for glycemic control are none).    NUTRITION - FOCUSED PHYSICAL EXAM:  Flowsheet Row Most Recent Value  Orbital Region No depletion  Upper Arm Region Mild depletion  Thoracic and Lumbar Region No depletion  Buccal Region No depletion  Temple Region No depletion  Clavicle Bone Region No depletion  Clavicle and Acromion Bone Region No depletion  Scapular Bone Region No depletion  Dorsal Hand Mild depletion  Patellar Region Mild depletion  Anterior Thigh Region Mild depletion  Posterior Calf Region Mild depletion  Edema (RD Assessment) None  Hair Reviewed  Eyes Reviewed  Mouth Reviewed  Skin Reviewed  Nails Reviewed       Diet Order:   Diet Order             Diet NPO time specified  Diet effective midnight           Diet NPO time specified Except for: Sips with Meds  Diet effective midnight                   EDUCATION NEEDS:   Education needs have been addressed  Skin:  Skin Assessment: Reviewed RN Assessment  Last BM:  07/19/21  Height:   Ht Readings from Last 1  Encounters:  07/20/21 5\' 9"  (1.753 m)    Weight:   Wt Readings from Last 1 Encounters:  07/20/21 71 kg    Ideal Body Weight:  72.7 kg  BMI:  Body mass index is 23.11 kg/m.  Estimated Nutritional Needs:   Kcal:  2000-2200  Protein:  95-110 grams  Fluid:  1000 ml +UOP    Loistine Chance, RD, LDN, [place] Registered Dietitian II Certified Diabetes Care and Education Specialist Please refer to [facility] for RD and/or RD on-call/weekend/after hours pager

## 2021-07-20 NOTE — Op Note (Signed)
[facility]  Percutaneous Study/Intervention Procedural Note   Date of Surgery: 07/20/2021  Surgeon(s):Ramari Bray    Assistants:none  Pre-operative Diagnosis: PAD with rest pain right lower  Post-operative diagnosis:  Same  Procedure(s) Performed:             1.  Ultrasound guidance for vascular access left femoral artery             2.  Catheter placement into right common femoral artery from left femoral approach             3.  Aortogram and selective right lower extremity angiogram             4.  Percutaneous transluminal angioplasty of right anterior tibial artery with 2.5 mm x 30 cm length angioplasty balloon             5.  Percutaneous transluminal angioplasty of the right tibioperoneal trunk and proximal peroneal artery with 3 mm diameter by 15 cm length angioplasty balloon  6.  Percutaneous transluminal angioplasty of the right popliteal artery with 5 mm diameter by 6 cm length Lutonix drug-coated angioplasty balloon             7.  StarClose closure device left femoral artery  EBL: 5 cc  Contrast: 60 cc  Fluoro Time: 7.5 minutes  Moderate Conscious Sedation Time: approximately 43 minutes using 2 mg of Versed and 50 mcg of Fentanyl              Indications:  Patient is a 65 y.o.male with rest pain of the right foot and a markedly reduced ABI with essentially no digital pressure. The patient is brought in for angiography for further evaluation and potential treatment.  Due to the limb threatening nature of the situation, angiogram was performed for attempted limb salvage. The patient is aware that if the procedure fails, amputation would be expected.  The patient also understands that even with successful revascularization, amputation may still be required due to the severity of the situation.  Risks and benefits are discussed and informed consent is obtained.   Procedure:  The patient was identified and appropriate procedural time out was performed.   The patient was then placed supine on the table and prepped and draped in the usual sterile fashion. Moderate conscious sedation was administered during a face to face encounter with the patient throughout the procedure with my supervision of the RN administering medicines and monitoring the patient's vital signs, pulse oximetry, telemetry and mental status throughout from the start of the procedure until the patient was taken to the recovery room. Ultrasound was used to evaluate the left common femoral artery.  It was patent .  A digital ultrasound image was acquired.  A Seldinger needle was used to access the left common femoral artery under direct ultrasound guidance and a permanent image was performed.  A 0.035 J wire was advanced without resistance and a 5Fr sheath was placed.  Pigtail catheter was placed into the aorta and an AP aortogram was performed. This demonstrated essentially no renal artery flow.  The aorta and iliac arteries were widely patent. I then crossed the aortic bifurcation and advanced to the right femoral head. Selective right lower extremity angiogram was then performed. This demonstrated fairly normal common femoral artery, profunda femoris artery, and proximal and mid superficial femoral artery.  There were some mild disease in the distal SFA that was not flow-limiting.  There was a focal lesion in the mid  popliteal artery about 60%.  There was then severe tibial disease in all 3 vessels.  The anterior tibial artery had multiple areas of occlusion and then terminated above the ankle.  The tibioperoneal trunk had an occlusion with reconstitution of both peroneal and posterior tibial arteries although both were occluded and did not flow distally.  The only flow into the foot was through collaterals of the tibial vessels. It was felt that it was in the patient's best interest to proceed with intervention after these images to avoid a second procedure and a larger amount of contrast and  fluoroscopy based off of the findings from the initial angiogram. The patient was systemically heparinized and a 6 French 70 cm sheath was then placed over the Terumo Advantage wire. I then used a Kumpe catheter and the advantage wire to navigate first into the anterior tibial artery.  I was able to get down to the level of the most distal flow in the anterior tibial artery which was about 8 to 10 cm above the ankle with a cross catheter and a V 18 wire.  I then used a 2.5 mm diameter by 30 cm length angioplasty balloon to treat this area and inflated this to 12 atm for 1 minute.  Imaging following this showed no greater than 30% residual stenosis in the areas treated, but it was still not continuous to the foot.  I then used the Ferd Hibbs cross catheter and the advantage wire to cross the tibioperoneal trunk occlusion and get down into the peroneal artery where it occluded in the proximal to mid segment.  A 3 mm diameter by 15 cm length angioplasty balloon was inflated in the proximal peroneal artery and tibioperoneal trunk taken up to 12 atm for 1 minute.  Completion imaging showed about a 40% residual stenosis in the tibioperoneal trunk but markedly improved.  I then treated the popliteal artery with a 5 mm diameter by 6 cm length Lutonix drug-coated angioplasty balloon inflated to 8 atm for 1 minute.  Completion imaging showed about a 20% residual stenosis in the popliteal artery after angioplasty.  At this point, there is really nothing further I could do from an endovascular standpoint and he really does not have a bypass option due to the poor distal targets.  We will hope that the areas treated today will improve his collateral blood flow to improve his rest pain.. I elected to terminate the procedure. The sheath was removed and StarClose closure device was deployed in the left femoral artery with excellent hemostatic result. The patient was taken to the recovery room in stable condition having tolerated the  procedure well.  Findings:               Aortogram: Essentially no renal artery flow.  The aorta and iliac arteries were widely patent.             Right lower Extremity:  This demonstrated fairly normal common femoral artery, profunda femoris artery, and proximal and mid superficial femoral artery.  There were some mild disease in the distal SFA that was not flow-limiting.  There was a focal lesion in the mid popliteal artery about 60%.  There was then severe tibial disease in all 3 vessels.  The anterior tibial artery had multiple areas of occlusion and then terminated above the ankle.  The tibioperoneal trunk had an occlusion with reconstitution of both peroneal and posterior tibial arteries although both were occluded and did not flow distally.  The only flow  into the foot was through collaterals of the tibial vessels.   Disposition: Patient was taken to the recovery room in stable condition having tolerated the procedure well.  Complications: None  Leotis Pain 07/20/2021 5:05 PM   This note was created with Dragon Medical transcription system. Any errors in dictation are purely unintentional.

## 2021-07-20 NOTE — Progress Notes (Signed)
[facility] Cardiology  SUBJECTIVE: Patient laying in bed, denies chest pain or shortness of breath   Vitals:   07/19/21 1828 07/19/21 1932 07/19/21 2346 07/20/21 0451  BP: 93/67 100/75 98/73 116/74  Pulse: 90 (!) 102  94  Resp:  18 16 20   Temp: 98 F (36.7 C) 98.8 F (37.1 C) 98.7 F (37.1 C) 98.7 F (37.1 C)  TempSrc:   Oral Oral  SpO2: 100% 98% 94% 98%  Weight:      Height:         Intake/Output Summary (Last 24 hours) at 07/20/2021 0756 Last data filed at 07/20/2021 0631 Gross per 24 hour  Intake 112.23 ml  Output --  Net 112.23 ml      PHYSICAL EXAM  General: Well developed, well nourished, in no acute distress HEENT:  Normocephalic and atramatic Neck:  No JVD.  Lungs: Clear bilaterally to auscultation and percussion. Heart: HRRR . Normal S1 and S2 without gallops or murmurs.  Abdomen: Bowel sounds are positive, abdomen soft and non-tender  Msk:  Back normal, normal gait. Normal strength and tone for age. Extremities: No clubbing, cyanosis or edema.   Neuro: Alert and oriented X 3. Psych:  Good affect, responds appropriately   LABS: Basic Metabolic Panel: Recent Labs    07/19/21 0737 07/20/21 0658  NA 139 134*  K 5.1 5.4*  CL 100 98  CO2 30 25  GLUCOSE 85 90  BUN 27* 51*  CREATININE 5.26* 7.45*  CALCIUM 8.3* 8.1*  MG  --  2.0   Liver Function Tests: Recent Labs    07/19/21 0737  AST 10*  ALT 10  ALKPHOS 76  BILITOT 1.0  PROT 6.3*  ALBUMIN 2.7*   No results for input(s): LIPASE, AMYLASE in the last 72 hours. CBC: Recent Labs    07/18/21 1622 07/19/21 0737 07/20/21 0658  WBC 6.6 5.3 7.3  NEUTROABS 4.5  --   --   HGB 10.9* 9.4* 9.3*  HCT 35.3* 30.9* 29.6*  MCV 101.1* 103.7* 101.0*  PLT 190 162 172   Cardiac Enzymes: Recent Labs    07/19/21 0737  CKTOTAL 14*   BNP: Invalid input(s): POCBNP D-Dimer: No results for input(s): DDIMER in the last 72 hours. Hemoglobin A1C: No results for input(s): HGBA1C in the last 72  hours. Fasting Lipid Panel: No results for input(s): CHOL, HDL, LDLCALC, TRIG, CHOLHDL, LDLDIRECT in the last 72 hours. Thyroid Function Tests: No results for input(s): TSH, T4TOTAL, T3FREE, THYROIDAB in the last 72 hours.  Invalid input(s): FREET3 Anemia Panel: Recent Labs    07/18/21 1622 07/18/21 2111  VITAMINB12  --  288  FOLATE 8.0  --   FERRITIN 1,129*  --   TIBC 228*  --   IRON 36*  --   RETICCTPCT 0.9  --     No results found.   Echo   TELEMETRY: Atrial fibrillation 104 bpm:  ASSESSMENT AND PLAN:  Principal Problem:   Lower limb ischemia Active Problems:   COPD (chronic obstructive pulmonary disease) (HCC)   Barrett esophagus   Essential hypertension   GERD (gastroesophageal reflux disease)   Atrial fibrillation with RVR (HCC)    1. Atrial fibrillation with rapid ventricular rate, likely exacerbated by right foot/great toe pain and critical limb ischemia, rate improved and controlled on Cardizem drip and metoprolol tartrate, patient currently asymptomatic, on heparin drip awaiting right lower extremity angiography in the a.m. 2.  Right lower extremity / critical limb ischemia awaiting angiography 3.  Mildly elevated  high-sensitivity troponin , in the absence of chest pain, chronically elevated in the setting of end-stage renal disease, likely not due to acute coronary syndrome 4.  ESRD on chronic hemodialysis 5.  COPD 6.  Essential hypertension, blood pressure low, metoprolol tartrate and enalapril being held being held   Recommendations   1.  Agree with current therapy 2.  Continue Cardizem drip for now 3.  Continue heparin drip 4.  Continue metoprolol to tartrate 5.  Proceed with angiography  without delay 6.  Defer cardiac diagnostics prior to angiography and potential revascularization right lower extremity, which will take priority over ischemia work-up in the absence of chest pain or diagnostic elevation of troponin     [name], MD,  PhD, [facility] 07/20/2021 7:56 AM

## 2021-07-21 ENCOUNTER — Encounter: Payer: Self-pay | Admitting: Vascular Surgery

## 2021-07-21 DIAGNOSIS — I998 Other disorder of circulatory system: Secondary | ICD-10-CM

## 2021-07-21 LAB — BASIC METABOLIC PANEL
Anion gap: 11 (ref 5–15)
BUN: 67 mg/dL — ABNORMAL HIGH (ref 8–23)
CO2: 27 mmol/L (ref 22–32)
Calcium: 7.9 mg/dL — ABNORMAL LOW (ref 8.9–10.3)
Chloride: 97 mmol/L — ABNORMAL LOW (ref 98–111)
Creatinine, Ser: 8.85 mg/dL — ABNORMAL HIGH (ref 0.61–1.24)
GFR, Estimated: 6 mL/min — ABNORMAL LOW (ref >60)
Glucose, Bld: 118 mg/dL — ABNORMAL HIGH (ref 70–99)
Potassium: 6 mmol/L — ABNORMAL HIGH (ref 3.5–5.1)
Sodium: 135 mmol/L (ref 135–145)

## 2021-07-21 LAB — CBC
HCT: 28.4 % — ABNORMAL LOW (ref 39.0–52.0)
Hemoglobin: 8.9 g/dL — ABNORMAL LOW (ref 13.0–17.0)
MCH: 31.4 pg (ref 26.0–34.0)
MCHC: 31.3 g/dL (ref 30.0–36.0)
MCV: 100.4 fL — ABNORMAL HIGH (ref 80.0–100.0)
Platelets: 164 10*3/uL (ref 150–400)
RBC: 2.83 MIL/uL — ABNORMAL LOW (ref 4.22–5.81)
RDW: 15.5 % (ref 11.5–15.5)
WBC: 6.8 10*3/uL (ref 4.0–10.5)
nRBC: 0 % (ref 0.0–0.2)

## 2021-07-21 LAB — HEPATITIS B SURFACE ANTIGEN: Hepatitis B Surface Ag: NONREACTIVE

## 2021-07-21 LAB — HEPATITIS B SURFACE ANTIBODY,QUALITATIVE: Hep B S Ab: REACTIVE — AB

## 2021-07-21 LAB — MAGNESIUM: Magnesium: 2.2 mg/dL (ref 1.7–2.4)

## 2021-07-21 MED ORDER — SODIUM CHLORIDE 0.9 % IV SOLN: 100.0000 mL | INTRAVENOUS | Status: DC | PRN

## 2021-07-21 MED ORDER — HEPARIN SODIUM (PORCINE) 1000 UNIT/ML DIALYSIS
1000.0000 [IU] | INTRAMUSCULAR | Status: DC | PRN
  Filled 2021-07-21: qty 1

## 2021-07-21 MED ORDER — LIDOCAINE-PRILOCAINE 2.5-2.5 % EX CREA
1.0000 "application " | TOPICAL_CREAM | CUTANEOUS | Status: DC | PRN
  Filled 2021-07-21: qty 5

## 2021-07-21 MED ORDER — PENTAFLUOROPROP-TETRAFLUOROETH EX AERO
1.0000 "application " | INHALATION_SPRAY | CUTANEOUS | Status: DC | PRN
  Filled 2021-07-21: qty 30

## 2021-07-21 MED ORDER — HYDROMORPHONE HCL 2 MG PO TABS
2.0000 mg | ORAL_TABLET | Freq: Four times a day (QID) | ORAL | Status: DC | PRN
  Administered 2021-07-22 – 2021-07-23 (×3): 2 mg via ORAL
  Filled 2021-07-21 (×3): qty 1

## 2021-07-21 MED ORDER — LIDOCAINE HCL (PF) 1 % IJ SOLN
5.0000 mL | INTRAMUSCULAR | Status: DC | PRN
  Filled 2021-07-21: qty 5

## 2021-07-21 MED ORDER — APIXABAN 5 MG PO TABS
5.0000 mg | ORAL_TABLET | Freq: Two times a day (BID) | ORAL | Status: DC
  Administered 2021-07-21 – 2021-07-23 (×4): 5 mg via ORAL
  Filled 2021-07-21 (×4): qty 1

## 2021-07-21 MED ORDER — CHLORHEXIDINE GLUCONATE CLOTH 2 % EX PADS
6.0000 | MEDICATED_PAD | Freq: Every day | CUTANEOUS | Status: DC
  Administered 2021-07-22 – 2021-07-23 (×2): 6 via TOPICAL

## 2021-07-21 MED ORDER — ALTEPLASE 2 MG IJ SOLR: 2.0000 mg | Freq: Once | INTRAMUSCULAR | Status: DC | PRN

## 2021-07-21 NOTE — Progress Notes (Signed)
[facility] Daily Progress Note  07/20/21:             1.  Ultrasound guidance for vascular access left femoral artery             2.  Catheter placement into right common femoral artery from left femoral approach             3.  Aortogram and selective right lower extremity angiogram             4.  Percutaneous transluminal angioplasty of right anterior tibial artery with 2.5 mm x 30 cm length angioplasty balloon             5.  Percutaneous transluminal angioplasty of the right tibioperoneal trunk and proximal peroneal artery with 3 mm diameter by 15 cm length angioplasty balloon             6.  Percutaneous transluminal angioplasty of the right popliteal artery with 5 mm diameter by 6 cm length Lutonix drug-coated angioplasty balloon             7.  StarClose closure device left femoral artery  Subjective: Patient without complaint this AM.  No acute issues overnight.  Objective: Vitals:   07/21/21 0129 07/21/21 0330 07/21/21 0714 07/21/21 1109  BP: (!) 93/59 98/74 101/65 133/76  Pulse: 85 78 68   Resp: 18 18 18 18   Temp: 97.8 F (36.6 C) 97.6 F (36.4 C) 98.1 F (36.7 C) 98.1 F (36.7 C)  TempSrc:  Oral  Oral  SpO2: 100% 98% 98% 98%  Weight:      Height:        Intake/Output Summary (Last 24 hours) at 07/21/2021 1116 Last data filed at 07/21/2021 1110 Gross per 24 hour  Intake 723 ml  Output --  Net 723 ml    Physical Exam: A&Ox3, NAD CV: RRR Pulmonary: CTA Bilaterally Abdomen: Soft, Nontender, Nondistended Left groin: Access site clean dry and intact. Vascular:  Right lower extremity: Thigh soft.  Calf soft.  Extremities warm distally toes.  Mild edema.  Hard to palpate pedal pulses however the foot is warm.   Laboratory: CBC    Component Value Date/Time   WBC 6.8 07/21/2021 0539   HGB 8.9 (L) 07/21/2021 0539   HGB 11.4 (L) 06/11/2012 0501   HCT 28.4 (L) 07/21/2021 0539   HCT 34.6 (L) 06/11/2012 0501   PLT 164 07/21/2021 0539   PLT  251 06/11/2012 0501   BMET    Component Value Date/Time   NA 135 07/21/2021 0539   NA 142 06/10/2012 0924   K 6.0 (H) 07/21/2021 0539   K 4.5 06/16/2013 1315   CL 97 (L) 07/21/2021 0539   CL 102 06/10/2012 0924   CO2 27 07/21/2021 0539   CO2 29 06/10/2012 0924   GLUCOSE 118 (H) 07/21/2021 0539   GLUCOSE 83 06/10/2012 0924   BUN 67 (H) 07/21/2021 0539   BUN 35 (H) 06/10/2012 0924   CREATININE 8.85 (H) 07/21/2021 0539   CREATININE 10.29 (H) 06/10/2012 0924   CALCIUM 7.9 (L) 07/21/2021 0539   CALCIUM 9.2 06/10/2012 0924   GFRNONAA 6 (L) 07/21/2021 0539   GFRNONAA 5 (L) 06/10/2012 0924   GFRAA 9 (L) 09/18/2019 0430   GFRAA 6 (L) 06/10/2012 0924   Assessment/Planning: The patient is a 65 year old male who presents with rest pain of the right foot status post endovascular intervention  1) the patient's only flow to his right foot  is via collaterals of the tibial vessels 2) patient is on aspirin, Plavix and statin for medical management 3) no further recommendations from vascular at this time.  From our standpoint he can be discharged home when medically stable.  Discussed with Dr. Ellis Parents [name] PA-C 07/21/2021 11:16 AM

## 2021-07-21 NOTE — Progress Notes (Addendum)
PROGRESS NOTE    [facility]  ZOX:[id number] DOB: 1956/02/17 DOA: 07/18/2021 PCP: Franciso Bend, MD  [id number]/[id number]-[id number]   Assessment & Plan:   Principal Problem:   Lower limb ischemia Active Problems:   COPD (chronic obstructive pulmonary disease) ([place])   Barrett esophagus   Essential hypertension   GERD (gastroesophageal reflux disease)   Atrial fibrillation with RVR ([place])   Scott Castro is a 65 y.o. male with medical history significant for hypertension, ESRD on HD, COPD/asthma due to secondhand smoke and peripheral arterial disease who presented to the emergency department for right foot pain.  Pt had an outpatient ABI that showed "Resting right ankle-brachial index indicates moderate right lower  extremity arterial disease. The right toe-brachial index is abnormal."  Podiatry Dr. Amalia Hailey had made an outpatient referral to Vascular surgery which had not happened yet.  Right foot pain 2/2 PAD S/p angioplasty with vascular surgery on 12/12 --per vascular, the patient's only flow to his right foot is via collaterals of the tibial vessels --cont statin --start plavix, but NOT ASA since pt has to be on Eliquis for Afib --oral dilaudid for pain  Afib w RVR, POA --No documented dx of Afib on record, however, pt said he has paroxysmal off-and-on A. fib and does not take any blood thinners but he does take metoprolol. --started on heparin gtt and dilt gtt on admission.  Heparin gtt since d/c'ed since it's not indicated for just primary stroke prevention. --cardio consulted Plan: --cont dilt gtt --cont home metop --start Eliquis today   COPD/asthma --not on home daily bronchodilators  ESRD on HD TTS --cont home Renvela --iHD per nephrology  Hyperkalemia --dialysis today for removal  GERD/history of Barrett's esophagus --cont PPI  Essential hypertension, not currently active --BP has been soft --cont home metop for rate control --hold home enalapril  Anemia of chronic  kidney disease --Epo with dialysis   DVT prophylaxis: Heparin SQ Code Status: Full code  Family Communication:  Level of care: Progressive Dispo:   The patient is from: home Anticipated d/c is to: home Anticipated d/c date is: 1-2 days Patient currently is not medically ready to d/c due to: Afib RVR on dilt gtt   Subjective and Interval History:  Pt reported right foot pain about the same, and now complained of plantar fasciitis.     Objective: Vitals:   07/21/21 1715 07/21/21 1730 07/21/21 1745 07/21/21 1800  BP: 106/70 111/72 121/90 125/82  Pulse:      Resp:      Temp:      TempSrc:      SpO2:      Weight:      Height:        Intake/Output Summary (Last 24 hours) at 07/21/2021 1818 Last data filed at 07/21/2021 1340 Gross per 24 hour  Intake 963 ml  Output --  Net 963 ml   Filed Weights   07/18/21 1620 07/18/21 1751 07/20/21 1533  Weight: 71 kg 71 kg 71 kg    Examination:   Constitutional: NAD, AAOx3 HEENT: conjunctivae and lids normal, EOMI CV: No cyanosis.   RESP: normal respiratory effort, on RA Extremities: right toes erythematous and cool Neuro: II - XII grossly intact.     Data Reviewed: I have personally reviewed following labs and imaging studies  CBC: Recent Labs  Lab 07/18/21 1622 07/19/21 0737 07/20/21 0658 07/21/21 0539  WBC 6.6 5.3 7.3 6.8  NEUTROABS 4.5  --   --   --  HGB 10.9* 9.4* 9.3* 8.9*  HCT 35.3* 30.9* 29.6* 28.4*  MCV 101.1* 103.7* 101.0* 100.4*  PLT 190 162 172 237   Basic Metabolic Panel: Recent Labs  Lab 07/18/21 1622 07/19/21 0737 07/20/21 0658 07/21/21 0539  NA 139 139 134* 135  K 3.4* 5.1 5.4* 6.0*  CL 97* 100 98 97*  CO2 30 30 25 27   GLUCOSE 73 85 90 118*  BUN 17 27* 51* 67*  CREATININE 3.74* 5.26* 7.45* 8.85*  CALCIUM 8.5* 8.3* 8.1* 7.9*  MG  --   --  2.0 2.2   GFR: Estimated Creatinine Clearance: 8.2 mL/min (A) (by C-G formula based on SCr of 8.85 mg/dL (H)). Liver Function Tests: Recent Labs   Lab 07/19/21 0737  AST 10*  ALT 10  ALKPHOS 76  BILITOT 1.0  PROT 6.3*  ALBUMIN 2.7*   No results for input(s): LIPASE, AMYLASE in the last 168 hours. No results for input(s): AMMONIA in the last 168 hours. Coagulation Profile: Recent Labs  Lab 07/18/21 1833  INR 1.1   Cardiac Enzymes: Recent Labs  Lab 07/19/21 0737  CKTOTAL 14*   BNP (last 3 results) No results for input(s): PROBNP in the last 8760 hours. HbA1C: No results for input(s): HGBA1C in the last 72 hours. CBG: No results for input(s): GLUCAP in the last 168 hours. Lipid Profile: No results for input(s): CHOL, HDL, LDLCALC, TRIG, CHOLHDL, LDLDIRECT in the last 72 hours. Thyroid Function Tests: No results for input(s): TSH, T4TOTAL, FREET4, T3FREE, THYROIDAB in the last 72 hours. Anemia Panel: Recent Labs    07/18/21 2111  VITAMINB12 288   Sepsis Labs: Recent Labs  Lab 07/18/21 2111 07/18/21 2240  LATICACIDVEN 1.1 1.2    Recent Results (from the past 240 hour(s))  Resp Panel by RT-PCR (Flu A&B, Covid) Nasopharyngeal Swab     Status: None   Collection Time: 07/18/21  6:33 PM   Specimen: Nasopharyngeal Swab; Nasopharyngeal(NP) swabs in vial transport medium  Result Value Ref Range Status   SARS Coronavirus 2 by RT PCR NEGATIVE NEGATIVE Final    Comment: (NOTE) SARS-CoV-2 target nucleic acids are NOT DETECTED.  The SARS-CoV-2 RNA is generally detectable in upper respiratory specimens during the acute phase of infection. The lowest concentration of SARS-CoV-2 viral copies this assay can detect is 138 copies/mL. A negative result does not preclude SARS-Cov-2 infection and should not be used as the sole basis for treatment or other patient management decisions. A negative result may occur with  improper specimen collection/handling, submission of specimen other than nasopharyngeal swab, presence of viral mutation(s) within the areas targeted by this assay, and inadequate number of  viral copies(<138 copies/mL). A negative result must be combined with clinical observations, patient history, and epidemiological information. The expected result is Negative.  Fact Sheet for Patients:  EntrepreneurPulse.com.au  Fact Sheet for Healthcare Providers:  IncredibleEmployment.be  This test is no t yet approved or cleared by the [place] FDA and  has been authorized for detection and/or diagnosis of SARS-CoV-2 by FDA under an Emergency Use Authorization (EUA). This EUA will remain  in effect (meaning this test can be used) for the duration of the COVID-19 declaration under Section 564(b)(1) of the Act, 21 U.S.C.section 360bbb-3(b)(1), unless the authorization is terminated  or revoked sooner.       Influenza A by PCR NEGATIVE NEGATIVE Final   Influenza B by PCR NEGATIVE NEGATIVE Final    Comment: (NOTE) The Xpert Xpress SARS-CoV-2/FLU/RSV plus assay is intended as an  aid in the diagnosis of influenza from Nasopharyngeal swab specimens and should not be used as a sole basis for treatment. Nasal washings and aspirates are unacceptable for Xpert Xpress SARS-CoV-2/FLU/RSV testing.  Fact Sheet for Patients: EntrepreneurPulse.com.au  Fact Sheet for Healthcare Providers: IncredibleEmployment.be  This test is not yet approved or cleared by the [place] FDA and has been authorized for detection and/or diagnosis of SARS-CoV-2 by FDA under an Emergency Use Authorization (EUA). This EUA will remain in effect (meaning this test can be used) for the duration of the COVID-19 declaration under Section 564(b)(1) of the Act, 21 U.S.C. section 360bbb-3(b)(1), unless the authorization is terminated or revoked.  Performed at [facility], [address], [place], [place] [address]       Radiology Studies: PERIPHERAL VASCULAR CATHETERIZATION  Result Date: 07/20/2021 See surgical note  for result.    Scheduled Meds:  (feeding supplement) PROSource Plus  30 mL Oral BID BM   apixaban  5 mg Oral BID   atorvastatin  10 mg Oral QHS   [START ON 07/22/2021] Chlorhexidine Gluconate Cloth  6 each Topical Q0600   cinacalcet  60 mg Oral Q supper   clopidogrel  75 mg Oral Daily   mouth rinse  15 mL Mouth Rinse BID   metoprolol tartrate  50 mg Oral BID   multivitamin  1 tablet Oral QHS   nutrition supplement (JUVEN)  1 packet Oral BID BM   pantoprazole (PROTONIX) IV  40 mg Intravenous Q12H   sevelamer carbonate  800 mg Oral TID WC   sodium chloride flush  3 mL Intravenous Q12H   Continuous Infusions:  sodium chloride     sodium chloride     diltiazem (CARDIZEM) infusion 5 mg/hr (07/21/21 0430)     LOS: 3 days     Enzo Bi, MD [facility] If 7PM-7AM, please contact night-coverage 07/21/2021, 6:18 PM

## 2021-07-21 NOTE — Progress Notes (Addendum)
[facility]  ROUNDING NOTE   Subjective:   Scott Castro is a 65 year old male with a past medical history including hypertension, Barrett's esophagus, pneumonia secondary to COVID, PAD, and end-stage renal disease requiring dialysis.  Patient presents to the emergency department with complaints of right foot pain with recent history of abnormal ABI.  Patient was admitted at that time for Lower limb ischemia [I99.8] PAD (peripheral artery disease) ([place]) [I73.9] ESRD on hemodialysis ([place]) [N18.6, Z99.2] Critical limb ischemia of right lower extremity with ulceration of foot ([place]) [I70.235]  Patient is known to our office and receives outpatient dialysis treatments at [facility], under the care of Dr. Holley Raring.  Patient reports his last treatment was on Saturday.  He received a full treatment with no concerns or complications.  He arrived to the emergency department after his treatment.  Patient states foot pain began a couple months prior but has worsened in the past few days.  Patient received an angiogram yesterday.  Occlusions were found in the right lower extremity and these areas were treated with angioplasty balloon.   We have been consulted to provide dialysis needs during this admission.  Patient currently seen resting in bed, alert and oriented Continues to complain of pain in right lower extremity.  Reports no improvement since procedure yesterday.  Tolerating meals without nausea and vomiting.  Denies shortness of breath.  Diltiazem drip   Objective:  Vital signs in last 24 hours:  Temp:  [97.6 F (36.4 C)-98.3 F (36.8 C)] 98.1 F (36.7 C) (12/13 1109) Pulse Rate:  [68-95] 68 (12/13 0714) Resp:  [13-22] 18 (12/13 1109) BP: (93-133)/(59-86) 133/76 (12/13 1109) SpO2:  [92 %-100 %] 98 % (12/13 1109) Weight:  [71 kg] 71 kg (12/12 1533)  Weight change:  Filed Weights   07/18/21 1620 07/18/21 1751 07/20/21 1533  Weight: 71 kg 71 kg 71 kg     Intake/Output: I/O last 3 completed shifts: In: 321.4 [P.O.:240; I.V.:81.4] Out: -    Intake/Output this shift:  Total I/O In: 480 [P.O.:480] Out: -   Physical Exam: General: NAD, resting in bed  Head: Normocephalic, atraumatic. Moist oral mucosal membranes  Eyes: Anicteric  Neck: trachea midline  Lungs:  Clear to auscultation, normal effort  Heart: Irregular rate and rhythm  Abdomen:  Soft, nontender  Extremities:  no peripheral edema.  Neurologic: Nonfocal, moving all four extremities  Skin: No lesions  Access: LUE AVG    Basic Metabolic Panel: Recent Labs  Lab 07/18/21 1622 07/19/21 0737 07/20/21 0658 07/21/21 0539  NA 139 139 134* 135  K 3.4* 5.1 5.4* 6.0*  CL 97* 100 98 97*  CO2 30 30 25 27   GLUCOSE 73 85 90 118*  BUN 17 27* 51* 67*  CREATININE 3.74* 5.26* 7.45* 8.85*  CALCIUM 8.5* 8.3* 8.1* 7.9*  MG  --   --  2.0 2.2    Liver Function Tests: Recent Labs  Lab 07/19/21 0737  AST 10*  ALT 10  ALKPHOS 76  BILITOT 1.0  PROT 6.3*  ALBUMIN 2.7*   No results for input(s): LIPASE, AMYLASE in the last 168 hours. No results for input(s): AMMONIA in the last 168 hours.  CBC: Recent Labs  Lab 07/18/21 1622 07/19/21 0737 07/20/21 0658 07/21/21 0539  WBC 6.6 5.3 7.3 6.8  NEUTROABS 4.5  --   --   --   HGB 10.9* 9.4* 9.3* 8.9*  HCT 35.3* 30.9* 29.6* 28.4*  MCV 101.1* 103.7* 101.0* 100.4*  PLT 190 162  172 164    Cardiac Enzymes: Recent Labs  Lab 07/19/21 0737  CKTOTAL 14*    BNP: Invalid input(s): POCBNP  CBG: No results for input(s): GLUCAP in the last 168 hours.  Microbiology: Results for orders placed or performed during the hospital encounter of 07/18/21  Resp Panel by RT-PCR (Flu A&B, Covid) Nasopharyngeal Swab     Status: None   Collection Time: 07/18/21  6:33 PM   Specimen: Nasopharyngeal Swab; Nasopharyngeal(NP) swabs in vial transport medium  Result Value Ref Range Status   SARS Coronavirus 2 by RT PCR NEGATIVE NEGATIVE  Final    Comment: (NOTE) SARS-CoV-2 target nucleic acids are NOT DETECTED.  The SARS-CoV-2 RNA is generally detectable in upper respiratory specimens during the acute phase of infection. The lowest concentration of SARS-CoV-2 viral copies this assay can detect is 138 copies/mL. A negative result does not preclude SARS-Cov-2 infection and should not be used as the sole basis for treatment or other patient management decisions. A negative result may occur with  improper specimen collection/handling, submission of specimen other than nasopharyngeal swab, presence of viral mutation(s) within the areas targeted by this assay, and inadequate number of viral copies(<138 copies/mL). A negative result must be combined with clinical observations, patient history, and epidemiological information. The expected result is Negative.  Fact Sheet for Patients:  EntrepreneurPulse.com.au  Fact Sheet for Healthcare Providers:  IncredibleEmployment.be  This test is no t yet approved or cleared by the [place] FDA and  has been authorized for detection and/or diagnosis of SARS-CoV-2 by FDA under an Emergency Use Authorization (EUA). This EUA will remain  in effect (meaning this test can be used) for the duration of the COVID-19 declaration under Section 564(b)(1) of the Act, 21 U.S.C.section 360bbb-3(b)(1), unless the authorization is terminated  or revoked sooner.       Influenza A by PCR NEGATIVE NEGATIVE Final   Influenza B by PCR NEGATIVE NEGATIVE Final    Comment: (NOTE) The Xpert Xpress SARS-CoV-2/FLU/RSV plus assay is intended as an aid in the diagnosis of influenza from Nasopharyngeal swab specimens and should not be used as a sole basis for treatment. Nasal washings and aspirates are unacceptable for Xpert Xpress SARS-CoV-2/FLU/RSV testing.  Fact Sheet for Patients: EntrepreneurPulse.com.au  Fact Sheet for Healthcare  Providers: IncredibleEmployment.be  This test is not yet approved or cleared by the [place] FDA and has been authorized for detection and/or diagnosis of SARS-CoV-2 by FDA under an Emergency Use Authorization (EUA). This EUA will remain in effect (meaning this test can be used) for the duration of the COVID-19 declaration under Section 564(b)(1) of the Act, 21 U.S.C. section 360bbb-3(b)(1), unless the authorization is terminated or revoked.  Performed at [facility], [address] [place], [place] [address]     Coagulation Studies: Recent Labs    07/18/21 1833  LABPROT 14.5  INR 1.1    Urinalysis: No results for input(s): COLORURINE, LABSPEC, PHURINE, GLUCOSEU, HGBUR, BILIRUBINUR, KETONESUR, PROTEINUR, UROBILINOGEN, NITRITE, LEUKOCYTESUR in the last 72 hours.  Invalid input(s): APPERANCEUR    Imaging: PERIPHERAL VASCULAR CATHETERIZATION  Result Date: 07/20/2021 See surgical note for result.    Medications:    diltiazem (CARDIZEM) infusion 5 mg/hr (07/21/21 0430)    (feeding supplement) PROSource Plus  30 mL Oral BID BM   atorvastatin  10 mg Oral QHS   [START ON 07/22/2021] Chlorhexidine Gluconate Cloth  6 each Topical Q0600   cinacalcet  60 mg Oral Q supper   clopidogrel  75 mg  Oral Daily   heparin injection (subcutaneous)  5,000 Units Subcutaneous Q8H   mouth rinse  15 mL Mouth Rinse BID   metoprolol tartrate  50 mg Oral BID   multivitamin  1 tablet Oral QHS   nutrition supplement (JUVEN)  1 packet Oral BID BM   pantoprazole (PROTONIX) IV  40 mg Intravenous Q12H   sevelamer carbonate  800 mg Oral TID WC   sodium chloride flush  3 mL Intravenous Q12H   acetaminophen **OR** acetaminophen, albuterol, hydrALAZINE, HYDROmorphone, ondansetron **OR** ondansetron (ZOFRAN) IV  Assessment/ Plan:  [name] is a 65 y.o.  male with a past medical history including hypertension, Barrett's esophagus, pneumonia secondary to  COVID, PAD, and end-stage renal disease requiring dialysis.  Patient presents to the emergency department with complaints of right foot pain with recent history of abnormal ABI.  Patient was admitted at that time for Lower limb ischemia [I99.8] PAD (peripheral artery disease) ([place]) [I73.9] ESRD on hemodialysis ([place]) [N18.6, Z99.2] Critical limb ischemia of right lower extremity with ulceration of foot ([place]) [I70.235]  [facility]/TTS/LUE AVG  End-stage renal disease on dialysis.  Will maintain outpatient schedule, if possible.  Plan to dialyze patient this afternoon.  Next scheduled treatment for Thursday.  2. Anemia of chronic kidney disease Lab Results  Component Value Date   HGB 8.9 (L) 07/21/2021  Hemoglobin below desired target.  Epogen ordered outpatient.  We will continue this with dialysis treatments.  Will order low-dose  EPO with next treatment  3. Secondary Hyperparathyroidism:  Lab Results  Component Value Date   PTH 93 (H) 04/09/2019   CALCIUM 7.9 (L) 07/21/2021   PHOS 7.1 (H) 07/04/2021    Corrected calcium of 8.9.  Phosphorus elevated.  Renvela prescribed outpatient with meals.  Recommend continuing this inpatient.  4.  Hypertension with chronic kidney disease.  Home regimen includes enalapril, minoxidil, and metoprolol.  Currently receiving metoprolol with as needed hydralazine available.  Currently receiving diltiazem drip for rate control.  BP currently 106/70.  5.  Right lower extremity ischemia.  Vascular intervention on 07/20/2021.  Stents placed via angiogram.  Pain control.   LOS: 3   12/13/20221:42 PM

## 2021-07-22 DIAGNOSIS — I70235 Atherosclerosis of native arteries of right leg with ulceration of other part of foot: Secondary | ICD-10-CM

## 2021-07-22 DIAGNOSIS — Z992 Dependence on renal dialysis: Secondary | ICD-10-CM

## 2021-07-22 DIAGNOSIS — N186 End stage renal disease: Secondary | ICD-10-CM

## 2021-07-22 LAB — CBC
HCT: 29.2 % — ABNORMAL LOW (ref 39.0–52.0)
Hemoglobin: 9.3 g/dL — ABNORMAL LOW (ref 13.0–17.0)
MCH: 32.5 pg (ref 26.0–34.0)
MCHC: 31.8 g/dL (ref 30.0–36.0)
MCV: 102.1 fL — ABNORMAL HIGH (ref 80.0–100.0)
Platelets: 150 10*3/uL (ref 150–400)
RBC: 2.86 MIL/uL — ABNORMAL LOW (ref 4.22–5.81)
RDW: 15.8 % — ABNORMAL HIGH (ref 11.5–15.5)
WBC: 4.7 10*3/uL (ref 4.0–10.5)
nRBC: 0 % (ref 0.0–0.2)

## 2021-07-22 LAB — BASIC METABOLIC PANEL
Anion gap: 9 (ref 5–15)
BUN: 43 mg/dL — ABNORMAL HIGH (ref 8–23)
CO2: 29 mmol/L (ref 22–32)
Calcium: 8.3 mg/dL — ABNORMAL LOW (ref 8.9–10.3)
Chloride: 97 mmol/L — ABNORMAL LOW (ref 98–111)
Creatinine, Ser: 6.59 mg/dL — ABNORMAL HIGH (ref 0.61–1.24)
GFR, Estimated: 9 mL/min — ABNORMAL LOW (ref >60)
Glucose, Bld: 88 mg/dL (ref 70–99)
Potassium: 5.1 mmol/L (ref 3.5–5.1)
Sodium: 135 mmol/L (ref 135–145)

## 2021-07-22 LAB — MAGNESIUM: Magnesium: 1.9 mg/dL (ref 1.7–2.4)

## 2021-07-22 LAB — HEPATITIS B SURFACE ANTIBODY, QUANTITATIVE: Hep B S AB Quant (Post): 81.1 m[IU]/mL (ref >9.9)

## 2021-07-22 MED ORDER — ATORVASTATIN CALCIUM 10 MG PO TABS
10.0000 mg | ORAL_TABLET | Freq: Every day | ORAL | 0 refills | Status: DC
Start: 2021-07-22 — End: 2021-10-25

## 2021-07-22 MED ORDER — METOPROLOL TARTRATE 50 MG PO TABS
100.0000 mg | ORAL_TABLET | Freq: Two times a day (BID) | ORAL | Status: DC
  Administered 2021-07-22 – 2021-07-23 (×2): 100 mg via ORAL
  Filled 2021-07-22 (×2): qty 2

## 2021-07-22 MED ORDER — DILTIAZEM HCL ER COATED BEADS 180 MG PO CP24: 180.0000 mg | ORAL_CAPSULE | Freq: Every day | ORAL | 0 refills | Status: DC

## 2021-07-22 MED ORDER — CLOPIDOGREL BISULFATE 75 MG PO TABS: 75.0000 mg | ORAL_TABLET | Freq: Every day | ORAL | 0 refills | Status: DC

## 2021-07-22 MED ORDER — DILTIAZEM HCL ER COATED BEADS 180 MG PO CP24
180.0000 mg | ORAL_CAPSULE | Freq: Every day | ORAL | Status: DC
  Administered 2021-07-22 – 2021-07-23 (×2): 180 mg via ORAL
  Filled 2021-07-22 (×2): qty 1

## 2021-07-22 MED ORDER — APIXABAN 5 MG PO TABS: 5.0000 mg | ORAL_TABLET | Freq: Two times a day (BID) | ORAL | 0 refills | Status: DC

## 2021-07-22 MED ORDER — PANTOPRAZOLE SODIUM 40 MG PO TBEC
40.0000 mg | DELAYED_RELEASE_TABLET | Freq: Two times a day (BID) | ORAL | Status: DC
  Administered 2021-07-22 – 2021-07-23 (×2): 40 mg via ORAL
  Filled 2021-07-22 (×2): qty 1

## 2021-07-22 NOTE — Evaluation (Signed)
Physical Therapy Evaluation Patient Details Name: Scott Castro MRN: [id number] DOB: 1956-05-03 Today's Date: 07/22/2021  History of Present Illness  Scott Castro is a 65 y.o. male, who presented to the emergency department with a 23-month history of pain in his right great toe.  He recently had ABIs that were diminished and had a right great toe pressure of 0. PMH includes: CKD on HD TTHS, hyperparathyroidism, and A-fib per EMR. Recently hospitalized for PNA 2/2 COVID on 07/03/21. S/P RLE angiography on 07/20/21 with L femoral approach.   Clinical Impression  Pt admitted with above diagnosis. Pt received sitting upright in bed agreeable to PT services. Resting on 2L/min via [place] with resting HR in mid 90's. Reports PLOF, DME, assist available at home, and home lay out without difficulty. Reports being indep with all ADL's/IADL's for all mobility with 1 fall recently at home requiring pt to crawl from the street to his house requiring UE support to stand. Unable to assess SPO2 due to poor pleth waveform during mobility most likely due to cool finger tips. Pt is mod-I with bed mobility and min guard for STS and ambulation with cues for RW and safe use of RW for sequencing and turns. Tolerated amb 21' with supervision requiring RW to off load RLE due to pain. Seated EoB endorsing SOB and lightheadedness max HR with ambbulation 106-117 BPM in a-fib. Trialed amb 5' in room to EOB without AD with significant need for UE support and unsteadiness due to increased antalgic gait requiring minA from PT. Pt placed on 2L/min via [place] with reduction in symptoms after 2 min seated rest with pursed lip breathing. RN informed unable to get accurate SPO2 numbers for need of supp O2 but updated on symptomology with OOB activity and reduction of symptoms with supplemental O2. Pt stand step t/f with no AD with use of bed rails and chair rails with minguard with all needs in place. Pt displaying deficits in endurance with ambulation,  requiring AD for mobility to reduce risk of falls. Will require HH PT to improve strength, balance, endurance, with LRAD to return to PLOF. Pt currently with functional limitations due to the deficits listed below (see PT Problem List). Pt will benefit from skilled PT to increase their independence and safety with mobility to allow discharge to the venue listed below.      Recommendations for follow up therapy are one component of a multi-disciplinary discharge planning process, led by the attending physician.  Recommendations may be updated based on patient status, additional functional criteria and insurance authorization.  Follow Up Recommendations Home health PT    Assistance Recommended at Discharge Intermittent Supervision/Assistance  Functional Status Assessment Patient has had a recent decline in their functional status and demonstrates the ability to make significant improvements in function in a reasonable and predictable amount of time.  Equipment Recommendations  Rolling walker (2 wheels)    Recommendations for Other Services       Precautions / Restrictions Precautions Precautions: Fall Restrictions Weight Bearing Restrictions: No      Mobility  Bed Mobility Overal bed mobility: Modified Independent             General bed mobility comments: use of bed rails, HOB slightly elevated. Performed with ease Patient Response: Cooperative  Transfers Overall transfer level: Needs assistance Equipment used: Rolling walker (2 wheels) Transfers: Sit to/from Stand Sit to Stand: Min guard           General transfer comment: VC's for  hand placement    Ambulation/Gait Ambulation/Gait assistance: Supervision Gait Distance (Feet): 60 Feet Assistive device: Rolling walker (2 wheels) Gait Pattern/deviations: Step-through pattern;Decreased stance time - right;Decreased step length - left;Antalgic       General Gait Details: Relies on BUE support on RW to offload RLE due  to pain  Stairs            Wheelchair Mobility    Modified Rankin (Stroke Patients Only)       Balance Overall balance assessment: Needs assistance Sitting-balance support: No upper extremity supported;Feet supported Sitting balance-Leahy Scale: Good Sitting balance - Comments: Able to don socks seated EOB with anterior trunk leaning   Standing balance support: Bilateral upper extremity supported;During functional activity Standing balance-Leahy Scale: Fair Standing balance comment: Relies on UE support to off load RLE                             Pertinent Vitals/Pain Pain Assessment: 0-10 Pain Score: 3  Pain Descriptors / Indicators: Discomfort;Aching Pain Intervention(s): Limited activity within patient's tolerance;Monitored during session;Repositioned    Home Living Family/patient expects to be discharged to:: Private residence Living Arrangements: Alone   Type of Home: House Home Access: Level entry       Home Layout: One level Home Equipment: None      Prior Function Prior Level of Function : Independent/Modified Independent             Mobility Comments: Reports 1 fall recently where he had to crawl from the street to his house due to posterior LOB.       Hand Dominance        Extremity/Trunk Assessment   Upper Extremity Assessment Upper Extremity Assessment: Overall WFL for tasks assessed    Lower Extremity Assessment Lower Extremity Assessment: Generalized weakness;RLE deficits/detail RLE Deficits / Details: R foot swelling    Cervical / Trunk Assessment Cervical / Trunk Assessment: Normal  Communication   Communication: No difficulties  Cognition Arousal/Alertness: Awake/alert Behavior During Therapy: WFL for tasks assessed/performed Overall Cognitive Status: Within Functional Limits for tasks assessed                                          General Comments General comments (skin integrity,  edema, etc.): HR at rest in upper 90's to low 100's. Up to 106-120 BPM in a-fib. UNable to monitor SPO2 due to poor pleth wave forms from cold fingers distally    Exercises Other Exercises Other Exercises: Role of PT in acute setting, DME needs, D/c recs, PLB   Assessment/Plan    PT Assessment Patient needs continued PT services  PT Problem List Decreased strength;Pain;Decreased activity tolerance;Decreased balance;Decreased safety awareness;Decreased mobility       PT Treatment Interventions DME instruction;Therapeutic exercise;Gait training;Balance training;Stair training;Neuromuscular re-education;Functional mobility training;Therapeutic activities;Patient/family education    PT Goals (Current goals can be found in the Care Plan section)  Acute Rehab PT Goals Patient Stated Goal: to return home PT Goal Formulation: With patient Time For Goal Achievement: 08/05/21 Potential to Achieve Goals: Good    Frequency Min 2X/week   Barriers to discharge   lives alone with no assist    Co-evaluation               AM-PAC PT "6 Clicks" Mobility  Outcome Measure Help needed turning from your back to  your side while in a flat bed without using bedrails?: None Help needed moving from lying on your back to sitting on the side of a flat bed without using bedrails?: None Help needed moving to and from a bed to a chair (including a wheelchair)?: A Little Help needed standing up from a chair using your arms (e.g., wheelchair or bedside chair)?: A Little Help needed to walk in hospital room?: A Little Help needed climbing 3-5 steps with a railing? : A Lot 6 Click Score: 19    End of Session Equipment Utilized During Treatment: Gait belt;Oxygen Activity Tolerance: Patient tolerated treatment well;Patient limited by fatigue Patient left: in chair;with call bell/phone within reach Nurse Communication: Mobility status PT Visit Diagnosis: Other abnormalities of gait and mobility  (R26.89);Muscle weakness (generalized) (M62.81)    Time: 7414-2395 PT Time Calculation (min) (ACUTE ONLY): 24 min   Charges:   PT Evaluation $PT Eval Moderate Complexity: 1 Mod PT Treatments $Gait Training: 8-22 mins        Salem Caster. Fairly IV, PT, DPT Physical Therapist- [facility]  07/22/2021, 2:11 PM

## 2021-07-22 NOTE — Progress Notes (Signed)
Hemodialysis patient known at [facility] ([place]) TTS 11:45am, patient transports himself to treatments. Patient did state that this has been difficult with his foot. We discussed whether or not he wanted to get transportation set up, patient stated that he would follow up with SW at his outpatient clinic. Please contact me with any dialysis placement concerns.

## 2021-07-22 NOTE — Clinical Social Work Note (Signed)
Occupational Therapy * Physical Therapy * Speech Therapy          DATE ___12/14/22________________ PATIENT NAME____[name]_________________ PATIENT MRN__[id number]__________________  DIAGNOSIS/DIAGNOSIS CODE ___Lower limb ischemia/ I99.8_________________ DATE OF DISCHARGE: __12/14/22____________  PRIMARY CARE PHYSICIAN __[facility]______________ PCP PHONE/FAX___[phone]________________________     Dear Provider (Name: __________________   Fax: ___________________________):   I certify that I have examined this patient and that occupational/physical/speech therapy is necessary on an outpatient basis.    The patient has expressed interest in completing their recommended course of therapy at your location.  Once a formal order from the patient's primary care physician has been obtained, please contact him/her to schedule an appointment for evaluation at your earliest convenience.   [  X]  Physical Therapy Evaluate and Treat          [  ]  Occupational Therapy Evaluate and Treat                                    [  ]  Speech Therapy Evaluate and Treat       The patient's primary care physician (listed above) must furnish and be responsible for a formal order such that the recommended services may be furnished while under the primary physician's care, and that the plan of care will be established and reviewed every 30 days (or more often if condition necessitates).

## 2021-07-22 NOTE — Discharge Summary (Signed)
Physician Discharge Summary  Patient ID: Scott Castro MRN: [id number] DOB/AGE: 08-27-1955 65 y.o.  Admit date: 07/18/2021 Discharge date: 07/22/2021  Admission Diagnoses:  Discharge Diagnoses:  Principal Problem:   Lower limb ischemia Active Problems:   COPD (chronic obstructive pulmonary disease) (HCC)   Barrett esophagus   Essential hypertension   GERD (gastroesophageal reflux disease)   Atrial fibrillation with RVR ([place])   Discharged Condition: good  Hospital Course:  Scott Castro is a 65 y.o. male with medical history significant for hypertension, ESRD on HD, COPD/asthma due to secondhand smoke and peripheral arterial disease who presented to the emergency department for right foot pain.   Pt had an outpatient ABI that showed "Resting right ankle-brachial index indicates moderate right lower  extremity arterial disease. The right toe-brachial index is abnormal.  Patient is seen by Dr. Orvan Seen angioplasty was performed on 12/12.  Right foot pain 2/2 PAD S/p angioplasty with vascular surgery on 12/12 Patient condition had improved, patient currently treated with Plavix, increased dose of Lipitor, Eliquis for atrial fibrillation. Patient be followed by PCP and vascular surgery as outpatient for  Paroxysmal atrial fibrillation with RVR. Patient is seen by cardiology, started on diltiazem drip and heparin drip and transition to Eliquis. Patient was also changed to oral diltiazem today.  Heart rate still controlled well. At this point, patient will be continued on home dose of metoprolol and added diltiazem. Follow-up with cardiology in 1 week.  COPD  Stable.  Essential hypertension. Continue calcium channel blocker and a beta-blocker.  End-stage renal disease. Anemia of chronic kidney disease. Hyperkalemia  Patient be followed by nephrology for HD as outpatient.  Potassium has normalized.       Consults: cardiology and vascular surgery  Significant Diagnostic  Studies:   Treatments: angiogram, anticoagulation, plavix  Discharge Exam: Blood pressure 128/81, pulse 86, temperature 98.3 F (36.8 C), temperature source Oral, resp. rate 16, height 5\' 9"  (1.753 m), weight 71 kg, SpO2 97 %. General appearance: alert and cooperative Resp: clear to auscultation bilaterally Cardio: regular rate and rhythm, S1, S2 normal, no murmur, click, rub or gallop GI: soft, non-tender; bowel sounds normal; no masses,  no organomegaly Extremities: Right foot warm, pedal pulse 1+  Disposition: Discharge disposition: 01-Home or Self Care       Discharge Instructions     Diet - low sodium heart healthy   Complete by: As directed    Discharge wound care:   Complete by: As directed    Keep clean at cath site   Increase activity slowly   Complete by: As directed       Allergies as of 07/22/2021   No Known Allergies      Medication List     STOP taking these medications    enalapril 20 MG tablet Commonly known as: VASOTEC   meloxicam 15 MG tablet Commonly known as: MOBIC   minoxidil 2.5 MG tablet Commonly known as: LONITEN   pravastatin 20 MG tablet Commonly known as: PRAVACHOL       TAKE these medications    albuterol 108 (90 Base) MCG/ACT inhaler Commonly known as: VENTOLIN HFA Inhale 2 puffs into the lungs every 6 (six) hours as needed for wheezing or shortness of breath.   apixaban 5 MG Tabs tablet Commonly known as: ELIQUIS Take 1 tablet (5 mg total) by mouth 2 (two) times daily.   atorvastatin 10 MG tablet Commonly known as: LIPITOR Take 1 tablet (10 mg total) by mouth at bedtime.  cinacalcet 60 MG tablet Commonly known as: SENSIPAR Take 60 mg by mouth daily.   clopidogrel 75 MG tablet Commonly known as: PLAVIX Take 1 tablet (75 mg total) by mouth daily. Start taking on: July 23, 2021   diltiazem 180 MG 24 hr capsule Commonly known as: CARDIZEM CD Take 1 capsule (180 mg total) by mouth daily. Start taking on:  July 23, 2021   metoprolol tartrate 100 MG tablet Commonly known as: LOPRESSOR Take 100 mg by mouth 2 (two) times daily.   pantoprazole 40 MG tablet Commonly known as: PROTONIX Take 40 mg by mouth daily.   sevelamer carbonate 800 MG tablet Commonly known as: RENVELA Take by mouth See admin instructions. Take 2400 mg by mouth three times daily, 1600 mg with snacks   Voltaren 1 % Gel Generic drug: diclofenac Sodium Apply 2 g topically 4 (four) times daily. Use on feet               Discharge Care Instructions  (From admission, onward)           Start     Ordered   07/22/21 0000  Discharge wound care:       Comments: Keep clean at cath site   07/22/21 1355            Follow-up Information     Dew, Erskine Squibb, MD Follow up in 1 month(s).   Specialties: Vascular Surgery, Radiology, Interventional Cardiology Why: Can see Dew or Arna Medici. Will need ABi with visit. Contact information: [place] [address] [phone]         Franciso Bend, MD Follow up in 1 week(s).   Specialty: Nephrology Contact information: [place] [address] [phone]         Franciso Bend, MD Follow up in 1 week(s).   Specialty: Nephrology Contact information: [address] [place] [address] [phone]         [name], MD Follow up in 2 week(s).   Specialty: Cardiology Contact information: [facility] [place] [address] [phone]                32 minutes Signed: Sharen Hones 07/22/2021, 1:55 PM

## 2021-07-22 NOTE — TOC Initial Note (Signed)
Transition of Care [facility]) - Initial/Assessment Note    Patient Details  Name: Scott Castro MRN: [id number] Date of Birth: June 25, 1956  [facility]) CM/SW Contact:    Eileen Stanford, LCSW Phone Number: 07/22/2021, 3:29 PM  Clinical Narrative:   CSW unable to find a [place] agency to service pt. Pt is understanding and is agreeable to outpatient PT--located here at [facility]. Pt states he drives himself to appointments. Pt states he gets his meds from mail in service. Pt does not have a PCP. New pt appointment was made for pt at [place] for Jan 10th 1:00, information put on AVS. Rolling walker was ordered through [employer] and will be delivered at bedside.                Expected Discharge Plan: Home/Self Care Barriers to Discharge: No Barriers Identified   Patient Goals and CMS Choice Patient states their goals for this hospitalization and ongoing recovery are:: to get better      Expected Discharge Plan and Services Expected Discharge Plan: Home/Self Care In-house Referral: NA   Post Acute Care Choice: NA Living arrangements for the past 2 months: Single Family Home Expected Discharge Date: 07/22/21                                    Prior Living Arrangements/Services Living arrangements for the past 2 months: Single Family Home Lives with:: Self Patient language and need for interpreter reviewed:: Yes Do you feel safe going back to the place where you live?: Yes      Need for Family Participation in Patient Care: Yes (Comment) Care giver support system in place?: Yes (comment)   Criminal Activity/Legal Involvement Pertinent to Current Situation/Hospitalization: No - Comment as needed  Activities of Daily Living Home Assistive Devices/Equipment: Eyeglasses ADL Screening (condition at time of admission) Patient's cognitive ability adequate to safely complete daily activities?: Yes Is the patient deaf or have difficulty hearing?: No Does the patient have  difficulty seeing, even when wearing glasses/contacts?: No Does the patient have difficulty concentrating, remembering, or making decisions?: No Patient able to express need for assistance with ADLs?: Yes Does the patient have difficulty dressing or bathing?: No Independently performs ADLs?: Yes (appropriate for developmental age) Does the patient have difficulty walking or climbing stairs?: No Weakness of Legs: None Weakness of Arms/Hands: None  Permission Sought/Granted Permission sought to share information with : Family Supports    Share Information with NAME: Juliann Pulse     Permission granted to share info w Relationship: relative     Emotional Assessment Appearance:: Appears stated age Attitude/Demeanor/Rapport: Engaged Affect (typically observed): Accepting Orientation: : Oriented to Self, Oriented to Place, Oriented to  Time, Oriented to Situation Alcohol / Substance Use: Not Applicable Psych Involvement: No (comment)  Admission diagnosis:  Lower limb ischemia [I99.8] PAD (peripheral artery disease) ([place]) [I73.9] ESRD on hemodialysis ([place]) [N18.6, Z99.2] Critical limb ischemia of right lower extremity with ulceration of foot ([place]) [I70.235] Patient Active Problem List   Diagnosis Date Noted   Lower limb ischemia 07/18/2021   Atrial fibrillation with RVR ([place]) 07/18/2021   Pneumonia due to COVID-19 virus 07/03/2021   Hypoalbuminemia due to protein-calorie malnutrition ([place]) 07/03/2021   Macrocytic anemia 07/03/2021   PAD (peripheral artery disease) ([place]) 07/03/2021   GERD (gastroesophageal reflux disease) 07/03/2021   Mixed hyperlipidemia 07/03/2021   Sepsis ([place]) 12/12/2020   Bicipital tenosynovitis 12/04/2020  Localized, primary osteoarthritis of hand 12/04/2020   Hyperkalemia    COPD (chronic obstructive pulmonary disease) ([place]) 04/10/2019   Nonintractable epilepsy with complex partial seizures ([place]) 08/20/2014   Essential hypertension 01/09/2013   Lung nodule,  multiple 07/28/2012   Organ transplant candidate 02/04/2012   Arteriovenous fistula ([place]) 01/21/2012   At risk for dental problems 01/21/2012   Barrett esophagus 01/21/2012   FSGS (focal segmental glomerulosclerosis) 01/21/2012   Kidney transplanted 01/21/2012   PCP:  Franciso Bend, MD Pharmacy:   [id number] - [place], [place] [address] [place] [address] Phone: [phone] Fax: [phone]     Social Determinants of Health (SDOH) Interventions    Readmission Risk Interventions Readmission Risk Prevention Plan 07/22/2021 04/10/2019  Transportation Screening Complete Complete  PCP or Specialist Appt within 3-5 Days Complete Complete  HRI or [place] Complete Complete  Social Work Consult for [place] Planning/Counseling Complete Complete  Palliative Care Screening Not Applicable -  Medication Review Press photographer) Complete Complete  Some recent data might be hidden

## 2021-07-22 NOTE — Progress Notes (Signed)
[facility]  ROUNDING NOTE   Subjective:   Scott Castro is a 65 year old male with a past medical history including hypertension, Barrett's esophagus, pneumonia secondary to COVID, PAD, and end-stage renal disease requiring dialysis.  Patient presents to the emergency department with complaints of right foot pain with recent history of abnormal ABI.  Patient was admitted at that time for Lower limb ischemia [I99.8] PAD (peripheral artery disease) ([place]) [I73.9] ESRD on hemodialysis ([place]) [N18.6, Z99.2] Critical limb ischemia of right lower extremity with ulceration of foot ([place]) [I70.235]  Patient is known to our office and receives outpatient dialysis treatments at [facility], under the care of Dr. Holley Raring.    Patient seen sitting on side of bed Alert and oriented States pain is well controlled  Able to ambulate in room  Diltiazem drip   Objective:  Vital signs in last 24 hours:  Temp:  [97.7 F (36.5 C)-98.5 F (36.9 C)] 98.3 F (36.8 C) (12/14 0809) Pulse Rate:  [81-94] 85 (12/14 0822) Resp:  [16-18] 16 (12/14 0822) BP: (99-143)/(70-90) 124/77 (12/14 0822) SpO2:  [96 %-100 %] 100 % (12/14 0822)  Weight change:  Filed Weights   07/18/21 1620 07/18/21 1751 07/20/21 1533  Weight: 71 kg 71 kg 71 kg    Intake/Output: I/O last 3 completed shifts: In: 83 [P.O.:960; I.V.:3] Out: 1000 [Other:1000]   Intake/Output this shift:  Total I/O In: 180 [P.O.:180] Out: -   Physical Exam: General: NAD, resting in bed  Head: Normocephalic, atraumatic. Moist oral mucosal membranes  Eyes: Anicteric  Lungs:  Clear to auscultation, normal effort  Heart: Irregular rate and rhythm  Abdomen:  Soft, nontender  Extremities:  no peripheral edema.  Neurologic: Nonfocal, moving all four extremities  Skin: No lesions  Access: LUE AVG    Basic Metabolic Panel: Recent Labs  Lab 07/18/21 1622 07/19/21 0737 07/20/21 0658 07/21/21 0539 07/22/21 0708  NA 139 139 134*  135 135  K 3.4* 5.1 5.4* 6.0* 5.1  CL 97* 100 98 97* 97*  CO2 30 30 25 27 29   GLUCOSE 73 85 90 118* 88  BUN 17 27* 51* 67* 43*  CREATININE 3.74* 5.26* 7.45* 8.85* 6.59*  CALCIUM 8.5* 8.3* 8.1* 7.9* 8.3*  MG  --   --  2.0 2.2 1.9     Liver Function Tests: Recent Labs  Lab 07/19/21 0737  AST 10*  ALT 10  ALKPHOS 76  BILITOT 1.0  PROT 6.3*  ALBUMIN 2.7*    No results for input(s): LIPASE, AMYLASE in the last 168 hours. No results for input(s): AMMONIA in the last 168 hours.  CBC: Recent Labs  Lab 07/18/21 1622 07/19/21 0737 07/20/21 0658 07/21/21 0539 07/22/21 0708  WBC 6.6 5.3 7.3 6.8 4.7  NEUTROABS 4.5  --   --   --   --   HGB 10.9* 9.4* 9.3* 8.9* 9.3*  HCT 35.3* 30.9* 29.6* 28.4* 29.2*  MCV 101.1* 103.7* 101.0* 100.4* 102.1*  PLT 190 162 172 164 150     Cardiac Enzymes: Recent Labs  Lab 07/19/21 0737  CKTOTAL 14*     BNP: Invalid input(s): POCBNP  CBG: No results for input(s): GLUCAP in the last 168 hours.  Microbiology: Results for orders placed or performed during the hospital encounter of 07/18/21  Resp Panel by RT-PCR (Flu A&B, Covid) Nasopharyngeal Swab     Status: None   Collection Time: 07/18/21  6:33 PM   Specimen: Nasopharyngeal Swab; Nasopharyngeal(NP) swabs in vial transport medium  Result Value Ref Range Status   SARS Coronavirus 2 by RT PCR NEGATIVE NEGATIVE Final    Comment: (NOTE) SARS-CoV-2 target nucleic acids are NOT DETECTED.  The SARS-CoV-2 RNA is generally detectable in upper respiratory specimens during the acute phase of infection. The lowest concentration of SARS-CoV-2 viral copies this assay can detect is 138 copies/mL. A negative result does not preclude SARS-Cov-2 infection and should not be used as the sole basis for treatment or other patient management decisions. A negative result may occur with  improper specimen collection/handling, submission of specimen other than nasopharyngeal swab, presence of viral  mutation(s) within the areas targeted by this assay, and inadequate number of viral copies(<138 copies/mL). A negative result must be combined with clinical observations, patient history, and epidemiological information. The expected result is Negative.  Fact Sheet for Patients:  EntrepreneurPulse.com.au  Fact Sheet for Healthcare Providers:  IncredibleEmployment.be  This test is no t yet approved or cleared by the [place] FDA and  has been authorized for detection and/or diagnosis of SARS-CoV-2 by FDA under an Emergency Use Authorization (EUA). This EUA will remain  in effect (meaning this test can be used) for the duration of the COVID-19 declaration under Section 564(b)(1) of the Act, 21 U.S.C.section 360bbb-3(b)(1), unless the authorization is terminated  or revoked sooner.       Influenza A by PCR NEGATIVE NEGATIVE Final   Influenza B by PCR NEGATIVE NEGATIVE Final    Comment: (NOTE) The Xpert Xpress SARS-CoV-2/FLU/RSV plus assay is intended as an aid in the diagnosis of influenza from Nasopharyngeal swab specimens and should not be used as a sole basis for treatment. Nasal washings and aspirates are unacceptable for Xpert Xpress SARS-CoV-2/FLU/RSV testing.  Fact Sheet for Patients: EntrepreneurPulse.com.au  Fact Sheet for Healthcare Providers: IncredibleEmployment.be  This test is not yet approved or cleared by the [place] FDA and has been authorized for detection and/or diagnosis of SARS-CoV-2 by FDA under an Emergency Use Authorization (EUA). This EUA will remain in effect (meaning this test can be used) for the duration of the COVID-19 declaration under Section 564(b)(1) of the Act, 21 U.S.C. section 360bbb-3(b)(1), unless the authorization is terminated or revoked.  Performed at [facility], [address] [place], [place] [address]     Coagulation Studies: No  results for input(s): LABPROT, INR in the last 72 hours.   Urinalysis: No results for input(s): COLORURINE, LABSPEC, PHURINE, GLUCOSEU, HGBUR, BILIRUBINUR, KETONESUR, PROTEINUR, UROBILINOGEN, NITRITE, LEUKOCYTESUR in the last 72 hours.  Invalid input(s): APPERANCEUR    Imaging: PERIPHERAL VASCULAR CATHETERIZATION  Result Date: 07/20/2021 See surgical note for result.    Medications:    sodium chloride     sodium chloride      (feeding supplement) PROSource Plus  30 mL Oral BID BM   apixaban  5 mg Oral BID   atorvastatin  10 mg Oral QHS   Chlorhexidine Gluconate Cloth  6 each Topical Q0600   cinacalcet  60 mg Oral Q supper   clopidogrel  75 mg Oral Daily   diltiazem  180 mg Oral Daily   mouth rinse  15 mL Mouth Rinse BID   metoprolol tartrate  50 mg Oral BID   multivitamin  1 tablet Oral QHS   nutrition supplement (JUVEN)  1 packet Oral BID BM   pantoprazole (PROTONIX) IV  40 mg Intravenous Q12H   sevelamer carbonate  800 mg Oral TID WC   sodium chloride flush  3 mL Intravenous Q12H  sodium chloride, sodium chloride, acetaminophen **OR** acetaminophen, albuterol, alteplase, heparin, hydrALAZINE, HYDROmorphone, lidocaine (PF), lidocaine-prilocaine, ondansetron **OR** ondansetron (ZOFRAN) IV, pentafluoroprop-tetrafluoroeth  Assessment/ Plan:  Mr. JUANLUIS GUASTELLA is a 65 y.o.  male with a past medical history including hypertension, Barrett's esophagus, pneumonia secondary to COVID, PAD, and end-stage renal disease requiring dialysis.  Patient presents to the emergency department with complaints of right foot pain with recent history of abnormal ABI.  Patient was admitted at that time for Lower limb ischemia [I99.8] PAD (peripheral artery disease) ([place]) [I73.9] ESRD on hemodialysis ([place]) [N18.6, Z99.2] Critical limb ischemia of right lower extremity with ulceration of foot ([place]) [I70.235]  [facility]/TTS/LUE AVG  End-stage renal disease on dialysis.  Will maintain  outpatient schedule, if possible.  Received dialysis yesterday, UF goal 1L achieved. Next treatment scheduled for Thursday.   2. Anemia of chronic kidney disease Lab Results  Component Value Date   HGB 9.3 (L) 07/22/2021  Hemoglobin within target. Epogen ordered outpatient.  Low-dose  EPO with next treatment  3. Secondary Hyperparathyroidism:  Lab Results  Component Value Date   PTH 93 (H) 04/09/2019   CALCIUM 8.3 (L) 07/22/2021   PHOS 7.1 (H) 07/04/2021    Will continue to monitor bone mineral metabolism.   4.  Hypertension with chronic kidney disease.  Home regimen includes enalapril, minoxidil, and metoprolol.  Currently receiving metoprolol with as needed hydralazine available.  Currently receiving diltiazem drip for rate control.  BP 124/77  5.  Right lower extremity ischemia.  Vascular intervention on 07/20/2021.  Stents placed via angiogram.  Pain control.   LOS: 4   12/14/202210:48 AM

## 2021-07-23 LAB — BASIC METABOLIC PANEL
Anion gap: 9 (ref 5–15)
BUN: 62 mg/dL — ABNORMAL HIGH (ref 8–23)
CO2: 28 mmol/L (ref 22–32)
Calcium: 8.3 mg/dL — ABNORMAL LOW (ref 8.9–10.3)
Chloride: 97 mmol/L — ABNORMAL LOW (ref 98–111)
Creatinine, Ser: 8.25 mg/dL — ABNORMAL HIGH (ref 0.61–1.24)
GFR, Estimated: 7 mL/min — ABNORMAL LOW (ref >60)
Glucose, Bld: 116 mg/dL — ABNORMAL HIGH (ref 70–99)
Potassium: 5 mmol/L (ref 3.5–5.1)
Sodium: 134 mmol/L — ABNORMAL LOW (ref 135–145)

## 2021-07-23 LAB — CBC
HCT: 26.8 % — ABNORMAL LOW (ref 39.0–52.0)
Hemoglobin: 8.4 g/dL — ABNORMAL LOW (ref 13.0–17.0)
MCH: 31.3 pg (ref 26.0–34.0)
MCHC: 31.3 g/dL (ref 30.0–36.0)
MCV: 100 fL (ref 80.0–100.0)
Platelets: 159 10*3/uL (ref 150–400)
RBC: 2.68 MIL/uL — ABNORMAL LOW (ref 4.22–5.81)
RDW: 15.6 % — ABNORMAL HIGH (ref 11.5–15.5)
WBC: 5.1 10*3/uL (ref 4.0–10.5)
nRBC: 0 % (ref 0.0–0.2)

## 2021-07-23 LAB — MAGNESIUM: Magnesium: 1.9 mg/dL (ref 1.7–2.4)

## 2021-07-23 MED ORDER — EPOETIN ALFA 4000 UNIT/ML IJ SOLN
INTRAMUSCULAR | Status: AC
  Filled 2021-07-23: qty 1

## 2021-07-23 MED ORDER — EPOETIN ALFA 4000 UNIT/ML IJ SOLN
4000.0000 [IU] | Freq: Once | INTRAMUSCULAR | Status: AC
  Administered 2021-07-23: 4000 [IU] via INTRAVENOUS

## 2021-07-23 NOTE — TOC Progression Note (Addendum)
Transition of Care [facility]) - Progression Note    Patient Details  Name: GIL INGWERSEN MRN: [id number] Date of Birth: 11/10/1955  Transition of [facility]) CM/SW Contact  Eileen Stanford, LCSW Phone Number: 07/23/2021, 11:38 AM  Clinical Narrative:   CSW confirmed Pt has Medicare at primary. Corene Cornea with advanced will service. Pt will be updated following HD.    Expected Discharge Plan: Home/Self Care Barriers to Discharge: No Barriers Identified  Expected Discharge Plan and Services Expected Discharge Plan: Home/Self Care In-house Referral: NA   Post Acute Care Choice: NA Living arrangements for the past 2 months: Single Family Home Expected Discharge Date: 07/22/21                                     Social Determinants of Health (SDOH) Interventions    Readmission Risk Interventions Readmission Risk Prevention Plan 07/22/2021 04/10/2019  Transportation Screening Complete Complete  PCP or Specialist Appt within 3-5 Days Complete Complete  HRI or [place] Complete Complete  Social Work Consult for [place] Planning/Counseling Complete Complete  Palliative Care Screening Not Applicable -  Medication Review Press photographer) Complete Complete  Some recent data might be hidden

## 2021-07-23 NOTE — Progress Notes (Signed)
SATURATION QUALIFICATIONS: (This note is used to comply with regulatory documentation for home oxygen)  Patient Saturations on Room Air at Rest = 82%  Patient Saturations on Room Air while Ambulating = 81%  Patient Saturations on 3 Liters of oxygen while Ambulating = 88-91%  Please briefly explain why patient needs home oxygen: Patient has COPD and disease process is progressing. Patient will not be able to ambulate and complete ADL's safely without home oxygen.

## 2021-07-23 NOTE — Discharge Summary (Signed)
Physician Discharge Summary  Patient ID: Scott Castro MRN: [id number] DOB/AGE: [birth year] 65 y.o.  Admit date: 07/18/2021 Discharge date: 07/23/2021  Admission Diagnoses:  Discharge Diagnoses:  Principal Problem:   Lower limb ischemia Active Problems:   COPD (chronic obstructive pulmonary disease) (HCC)   Barrett esophagus   Essential hypertension   GERD (gastroesophageal reflux disease)   Atrial fibrillation with RVR ([place])   Discharged Condition: good  Hospital Course:  Scott Castro is a 65 y.o. male with medical history significant for hypertension, ESRD on HD, COPD/asthma due to secondhand smoke and peripheral arterial disease who presented to the emergency department for right foot pain.   Pt had an outpatient ABI that showed "Resting right ankle-brachial index indicates moderate right lower  extremity arterial disease. The right toe-brachial index is abnormal.  Patient is seen by Dr. Orvan Seen angioplasty was performed on 12/12.   Right foot pain 2/2 PAD S/p angioplasty with vascular surgery on 12/12 Patient condition had improved, patient currently treated with Plavix, increased dose of Lipitor, Eliquis for atrial fibrillation. Patient be followed by PCP and vascular surgery as outpatient for  Paroxysmal atrial fibrillation with RVR. Patient is seen by cardiology, started on diltiazem drip and heparin drip and transition to Eliquis. Patient was also changed to oral diltiazem today.  Heart rate still controlled well. At this point, patient will be continued on home dose of metoprolol and added diltiazem. Follow-up with cardiology in 1 week.  COPD  Stable.  Essential hypertension. Continue calcium channel blocker and a beta-blocker.   End-stage renal disease. Anemia of chronic kidney disease. Hyperkalemia Patient be followed by nephrology for HD as outpatient.  Potassium has normalized.   Patient did not feel comfortable yesterday to go home, he has been dialyzed, he  feels better.  He is medically stable to be discharged.  He still needs oxygen, will obtain home oxygen evaluation.   Consults: nephrology and vascular surgery  Significant Diagnostic Studies:   Treatments: HD , vascular  Discharge Exam: Blood pressure 128/80, pulse 92, temperature 97.7 F (36.5 C), temperature source Oral, resp. rate 14, height 5\' 9"  (1.753 m), weight 77.7 kg, SpO2 (!) 85 %. General appearance: alert and cooperative Resp: clear to auscultation bilaterally Cardio: regular rate and rhythm, S1, S2 normal, no murmur, click, rub or gallop GI: soft, non-tender; bowel sounds normal; no masses,  no organomegaly Extremities: extremities normal, atraumatic, no cyanosis or edema  Disposition: Discharge disposition: 01-Home or Self Care       Discharge Instructions     Diet - low sodium heart healthy   Complete by: As directed    Discharge wound care:   Complete by: As directed    Keep clean at cath site   Increase activity slowly   Complete by: As directed       Allergies as of 07/23/2021   No Known Allergies      Medication List     STOP taking these medications    enalapril 20 MG tablet Commonly known as: VASOTEC   meloxicam 15 MG tablet Commonly known as: MOBIC   minoxidil 2.5 MG tablet Commonly known as: LONITEN   pravastatin 20 MG tablet Commonly known as: PRAVACHOL       TAKE these medications    albuterol 108 (90 Base) MCG/ACT inhaler Commonly known as: VENTOLIN HFA Inhale 2 puffs into the lungs every 6 (six) hours as needed for wheezing or shortness of breath.   apixaban 5 MG Tabs tablet Commonly known  asArne [place] Take 1 tablet (5 mg total) by mouth 2 (two) times daily.   atorvastatin 10 MG tablet Commonly known as: LIPITOR Take 1 tablet (10 mg total) by mouth at bedtime.   cinacalcet 60 MG tablet Commonly known as: SENSIPAR Take 60 mg by mouth daily.   clopidogrel 75 MG tablet Commonly known as: PLAVIX Take 1 tablet (75  mg total) by mouth daily.   diltiazem 180 MG 24 hr capsule Commonly known as: CARDIZEM CD Take 1 capsule (180 mg total) by mouth daily.   metoprolol tartrate 100 MG tablet Commonly known as: LOPRESSOR Take 100 mg by mouth 2 (two) times daily.   pantoprazole 40 MG tablet Commonly known as: PROTONIX Take 40 mg by mouth daily.   sevelamer carbonate 800 MG tablet Commonly known as: RENVELA Take by mouth See admin instructions. Take 2400 mg by mouth three times daily, 1600 mg with snacks   Voltaren 1 % Gel Generic drug: diclofenac Sodium Apply 2 g topically 4 (four) times daily. Use on feet               Durable Medical Equipment  (From admission, onward)           Start     Ordered   07/22/21 1515  For home use only DME Walker rolling  Once       Question Answer Comment  Walker: With [place] Wheels   Patient needs a walker to treat with the following condition PVD (peripheral vascular disease) ([place])      07/22/21 1514              Discharge Care Instructions  (From admission, onward)           Start     Ordered   07/22/21 0000  Discharge wound care:       Comments: Keep clean at cath site   07/22/21 1355            Follow-up Information     Dew, Erskine Squibb, MD Follow up in 1 month(s).   Specialties: Vascular Surgery, Radiology, Interventional Cardiology Why: Can see Dew or Arna Medici. Will need ABi with visit. Contact information: [place] [address] [phone]         Franciso Bend, MD Follow up in 1 week(s).   Specialty: Nephrology Contact information: [place] [address] [phone]         Franciso Bend, MD Follow up in 1 week(s).   Specialty: Nephrology Contact information: [address] [place] [address] [phone]         [name], MD Follow up in 2 week(s).   Specialty: Cardiology Contact information: [facility] [place] [address] [phone]         [facility], [place] on 08/18/2021.   Why: Appointment is at 1:00 PM. Please arrive at 12:30 AM. Please take ID and insurance card. Contact information: [place] [address] [phone]                 Signed: Sharen Hones 07/23/2021, 2:45 PM

## 2021-07-23 NOTE — TOC Progression Note (Signed)
Transition of Care [facility]) - Progression Note    Patient Details  Name: Scott Castro MRN: [id number] Date of Birth: 1956/03/11  Transition of Care [facility]) CM/SW Contact  Eileen Stanford, LCSW Phone Number: 07/23/2021, 4:11 PM  Clinical Narrative:   02 ordered through [employer]. Will be delivered to bedside.    Expected Discharge Plan: Home/Self Care Barriers to Discharge: No Barriers Identified  Expected Discharge Plan and Services Expected Discharge Plan: Home/Self Care In-house Referral: NA   Post Acute Care Choice: NA Living arrangements for the past 2 months: Single Family Home Expected Discharge Date: 07/23/21                                     Social Determinants of Health (SDOH) Interventions    Readmission Risk Interventions Readmission Risk Prevention Plan 07/22/2021 04/10/2019  Transportation Screening Complete Complete  PCP or Specialist Appt within 3-5 Days Complete Complete  HRI or [place] Complete Complete  Social Work Consult for [place] Planning/Counseling Complete Complete  Palliative Care Screening Not Applicable -  Medication Review Press photographer) Complete Complete  Some recent data might be hidden

## 2021-07-23 NOTE — Progress Notes (Signed)
Nutrition Follow-up  DOCUMENTATION CODES:   Not applicable  INTERVENTION:   -Continue renal MVI daily -Continue 30 ml Prosource Plus BID, each supplement provides 100 kcals and 15 grams protein -Continue 1 packet Juven BID, each packet provides 95 calories, 2.5 grams of protein (collagen), and 9.8 grams of carbohydrate (3 grams sugar); also contains 7 grams of L-arginine and L-glutamine, 300 mg vitamin C, 15 mg vitamin E, 1.2 mcg vitamin B-12, 9.5 mg zinc, 200 mg calcium, and 1.5 g  Calcium Beta-hydroxy-Beta-methylbutyrate to support wound healing   NUTRITION DIAGNOSIS:   Increased nutrient needs related to wound healing as evidenced by estimated needs.  Ongoing  GOAL:   Patient will meet greater than or equal to 90% of their needs  Progressing   MONITOR:   PO intake, Supplement acceptance, Labs, Weight trends, Skin, I & O's  REASON FOR ASSESSMENT:   Consult Assessment of nutrition requirement/status  ASSESSMENT:   WILDER KUROWSKI is a 65 y.o. male seen in ed with complaints of right foot pain and has abnormal ABI 4 days ago and he is a dialysis.Pt is here for Critical limb ischemia with pain in the great toe, right first MTP.  Patient has a history of PAD but has never smoked in his home life.  Not particularly sure in his case for his PAD etiology is.  Patient states that his pain has been going on for 3 months initially he thought it was issues he went to his primary care he tried different inserts.  He went to podiatry, had ABIs and were abnormal.  Chart review does not say any history of A. fib however patient has had a history of atrial fibrillation for several years and has been taking metoprolol but does not have any anticoagulation on board.  Discussed with patient that currently with his limb ischemia requiring heparin that benefits in both ways because atrial fibrillation puts him at high risk for having a stroke.  12/12- s/p Procedure(s) Performed:             1.   Ultrasound guidance for vascular access left femoral artery             2.  Catheter placement into right common femoral artery from left femoral approach             3.  Aortogram and selective right lower extremity angiogram             4.  Percutaneous transluminal angioplasty of right anterior tibial artery with 2.5 mm x 30 cm length angioplasty balloon             5.  Percutaneous transluminal angioplasty of the right tibioperoneal trunk and proximal peroneal artery with 3 mm diameter by 15 cm length angioplasty balloon             6.  Percutaneous transluminal angioplasty of the right popliteal artery with 5 mm diameter by 6 cm length Lutonix drug-coated angioplasty balloon             7.  StarClose closure device left femoral artery  Reviewed I/O's: +1 L x 24 hours and +1.1 L since admission   Pt out of room at time of visit. He has a good appetite. Noted meal completions 50-100%. Observed breakfast tray- pt consumed 75% of meal.   Medications reviewed and include cardizem.   Per TOC notes, plan to discharge home withy outpatient therapy services.   Labs reviewed: Na: 134.    Diet  Order:   Diet Order             Diet - low sodium heart healthy           Diet renal with fluid restriction Fluid restriction: 1200 mL Fluid; Room service appropriate? Yes; Fluid consistency: Thin  Diet effective now                   EDUCATION NEEDS:   Education needs have been addressed  Skin:  Skin Assessment: Reviewed RN Assessment  Last BM:  07/19/21  Height:   Ht Readings from Last 1 Encounters:  07/20/21 5\' 9"  (1.753 m)    Weight:   Wt Readings from Last 1 Encounters:  07/23/21 77.7 kg    Ideal Body Weight:  72.7 kg  BMI:  Body mass index is 25.3 kg/m.  Estimated Nutritional Needs:   Kcal:  2000-2200  Protein:  95-110 grams  Fluid:  1000 ml +UOP    Loistine Chance, RD, LDN, [place] Registered Dietitian II Certified Diabetes Care and Education Specialist Please  refer to [facility] for RD and/or RD on-call/weekend/after hours pager

## 2021-07-23 NOTE — Progress Notes (Deleted)
Mobility Specialist - Progress Note   07/23/21 1554  Mobility  Activity Ambulated in hall  Level of Assistance Minimal assist, patient does 75% or more  Assistive Device Front wheel walker  Distance Ambulated (ft) 80 ft  Mobility Ambulated with assistance in hallway  Mobility Response Tolerated well  Mobility performed by Mobility specialist  $Mobility charge 1 Mobility    O2 while resting on RA = 82% O2 while AMB on RA = n/a O2 while AMB on 3L = 88-91%    Pt sitting EOB on arrival, utilizing 2L. Voiced pain 2/10 that does increase to 4/10 with activity. O2 difficult to determine d/t poor pleth in fingers---ear pulse ox applied. O2 increased to 3L during ambulation, maintaining sats >/= 88%. Pt denied SOB throughout session. Denied any distress. LOBx1 during turns, corrected by CGA. Pt returned to bed with alarm set, back on 2L. RN notified.    Kathee Delton Mobility Specialist 07/23/21, 4:12 PM

## 2021-07-23 NOTE — Progress Notes (Signed)
[facility]  ROUNDING NOTE   Subjective:   Scott Castro is a 65 year old male with a past medical history including hypertension, Barrett's esophagus, pneumonia secondary to COVID, PAD, and end-stage renal disease requiring dialysis.  Patient presents to the emergency department with complaints of right foot pain with recent history of abnormal ABI.  Patient was admitted at that time for Lower limb ischemia [I99.8] PAD (peripheral artery disease) ([place]) [I73.9] ESRD on hemodialysis ([place]) [N18.6, Z99.2] Critical limb ischemia of right lower extremity with ulceration of foot ([place]) [I70.235]  Patient is known to our office and receives outpatient dialysis treatments at [name], under the care of Dr. Holley Castro.    Patient seen and evaluated during dialysis   HEMODIALYSIS FLOWSHEET:  Blood Flow Rate (mL/min): 400 mL/min Arterial Pressure (mmHg): -140 mmHg Venous Pressure (mmHg): 180 mmHg Transmembrane Pressure (mmHg): 60 mmHg Ultrafiltration Rate (mL/min): 500 mL/min Dialysate Flow Rate (mL/min): 500 ml/min Conductivity: Machine : 13.6 Conductivity: Machine : 13.6 Dialysis Fluid Bolus: Normal Saline Bolus Amount (mL): 250 mL  Right foot pain improved No other complaints at this time  Objective:  Vital signs in last 24 hours:  Temp:  [97.7 F (36.5 C)-100.1 F (37.8 C)] 99.1 F (37.3 C) (12/15 0913) Pulse Rate:  [63-100] 92 (12/15 1100) Resp:  [15-24] 22 (12/15 1145) BP: (88-134)/(64-92) 129/79 (12/15 1145) SpO2:  [85 %-100 %] 85 % (12/15 0747) Weight:  [77.2 kg] 77.2 kg (12/15 0913)  Weight change:  Filed Weights   07/18/21 1751 07/20/21 1533 07/23/21 0913  Weight: 71 kg 71 kg 77.2 kg    Intake/Output: I/O last 3 completed shifts: In: 1020 [P.O.:1020] Out: 1000 [Other:1000]   Intake/Output this shift:  No intake/output data recorded.  Physical Exam: General: NAD, resting in bed  Head: Normocephalic, atraumatic. Moist oral mucosal membranes   Eyes: Anicteric  Lungs:  Clear to auscultation, normal effort  Heart: Irregular rate and rhythm  Abdomen:  Soft, nontender  Extremities:  no peripheral edema.  Neurologic: Nonfocal, moving all four extremities  Skin: No lesions  Access: LUE AVG    Basic Metabolic Panel: Recent Labs  Lab 07/19/21 0737 07/20/21 0658 07/21/21 0539 07/22/21 0708 07/23/21 0337  NA 139 134* 135 135 134*  K 5.1 5.4* 6.0* 5.1 5.0  CL 100 98 97* 97* 97*  CO2 30 25 27 29 28   GLUCOSE 85 90 118* 88 116*  BUN 27* 51* 67* 43* 62*  CREATININE 5.26* 7.45* 8.85* 6.59* 8.25*  CALCIUM 8.3* 8.1* 7.9* 8.3* 8.3*  MG  --  2.0 2.2 1.9 1.9     Liver Function Tests: Recent Labs  Lab 07/19/21 0737  AST 10*  ALT 10  ALKPHOS 76  BILITOT 1.0  PROT 6.3*  ALBUMIN 2.7*    No results for input(s): LIPASE, AMYLASE in the last 168 hours. No results for input(s): AMMONIA in the last 168 hours.  CBC: Recent Labs  Lab 07/18/21 1622 07/19/21 0737 07/20/21 0658 07/21/21 0539 07/22/21 0708 07/23/21 0337  WBC 6.6 5.3 7.3 6.8 4.7 5.1  NEUTROABS 4.5  --   --   --   --   --   HGB 10.9* 9.4* 9.3* 8.9* 9.3* 8.4*  HCT 35.3* 30.9* 29.6* 28.4* 29.2* 26.8*  MCV 101.1* 103.7* 101.0* 100.4* 102.1* 100.0  PLT 190 162 172 164 150 159     Cardiac Enzymes: Recent Labs  Lab 07/19/21 0737  CKTOTAL 14*     BNP: Invalid input(s): POCBNP  CBG: No results  for input(s): GLUCAP in the last 168 hours.  Microbiology: Results for orders placed or performed during the hospital encounter of 07/18/21  Resp Panel by RT-PCR (Flu A&B, Covid) Nasopharyngeal Swab     Status: None   Collection Time: 07/18/21  6:33 PM   Specimen: Nasopharyngeal Swab; Nasopharyngeal(NP) swabs in vial transport medium  Result Value Ref Range Status   SARS Coronavirus 2 by RT PCR NEGATIVE NEGATIVE Final    Comment: (NOTE) SARS-CoV-2 target nucleic acids are NOT DETECTED.  The SARS-CoV-2 RNA is generally detectable in upper  respiratory specimens during the acute phase of infection. The lowest concentration of SARS-CoV-2 viral copies this assay can detect is 138 copies/mL. A negative result does not preclude SARS-Cov-2 infection and should not be used as the sole basis for treatment or other patient management decisions. A negative result may occur with  improper specimen collection/handling, submission of specimen other than nasopharyngeal swab, presence of viral mutation(s) within the areas targeted by this assay, and inadequate number of viral copies(<138 copies/mL). A negative result must be combined with clinical observations, patient history, and epidemiological information. The expected result is Negative.  Fact Sheet for Patients:  EntrepreneurPulse.com.au  Fact Sheet for Healthcare Providers:  IncredibleEmployment.be  This test is no t yet approved or cleared by the [place] FDA and  has been authorized for detection and/or diagnosis of SARS-CoV-2 by FDA under an Emergency Use Authorization (EUA). This EUA will remain  in effect (meaning this test can be used) for the duration of the COVID-19 declaration under Section 564(b)(1) of the Act, 21 U.S.C.section 360bbb-3(b)(1), unless the authorization is terminated  or revoked sooner.       Influenza A by PCR NEGATIVE NEGATIVE Final   Influenza B by PCR NEGATIVE NEGATIVE Final    Comment: (NOTE) The Xpert Xpress SARS-CoV-2/FLU/RSV plus assay is intended as an aid in the diagnosis of influenza from Nasopharyngeal swab specimens and should not be used as a sole basis for treatment. Nasal washings and aspirates are unacceptable for Xpert Xpress SARS-CoV-2/FLU/RSV testing.  Fact Sheet for Patients: EntrepreneurPulse.com.au  Fact Sheet for Healthcare Providers: IncredibleEmployment.be  This test is not yet approved or cleared by the [place] FDA and has been  authorized for detection and/or diagnosis of SARS-CoV-2 by FDA under an Emergency Use Authorization (EUA). This EUA will remain in effect (meaning this test can be used) for the duration of the COVID-19 declaration under Section 564(b)(1) of the Act, 21 U.S.C. section 360bbb-3(b)(1), unless the authorization is terminated or revoked.  Performed at [facility], [address] [place], [place] [address]     Coagulation Studies: No results for input(s): LABPROT, INR in the last 72 hours.   Urinalysis: No results for input(s): COLORURINE, LABSPEC, PHURINE, GLUCOSEU, HGBUR, BILIRUBINUR, KETONESUR, PROTEINUR, UROBILINOGEN, NITRITE, LEUKOCYTESUR in the last 72 hours.  Invalid input(s): APPERANCEUR    Imaging: No results found.   Medications:    sodium chloride     sodium chloride      (feeding supplement) PROSource Plus  30 mL Oral BID BM   apixaban  5 mg Oral BID   atorvastatin  10 mg Oral QHS   Chlorhexidine Gluconate Cloth  6 each Topical Q0600   cinacalcet  60 mg Oral Q supper   clopidogrel  75 mg Oral Daily   diltiazem  180 mg Oral Daily   mouth rinse  15 mL Mouth Rinse BID   metoprolol tartrate  100 mg Oral BID   multivitamin  1 tablet Oral QHS   nutrition supplement (JUVEN)  1 packet Oral BID BM   pantoprazole  40 mg Oral BID   sevelamer carbonate  800 mg Oral TID WC   sodium chloride flush  3 mL Intravenous Q12H   sodium chloride, sodium chloride, acetaminophen **OR** acetaminophen, albuterol, alteplase, heparin, hydrALAZINE, HYDROmorphone, lidocaine (PF), lidocaine-prilocaine, ondansetron **OR** ondansetron (ZOFRAN) IV, pentafluoroprop-tetrafluoroeth  Assessment/ Plan:  [name] is a 65 y.o.  male with a past medical history including hypertension, Barrett's esophagus, pneumonia secondary to COVID, PAD, and end-stage renal disease requiring dialysis.  Patient presents to the emergency department with complaints of right foot pain with recent  history of abnormal ABI.  Patient was admitted at that time for Lower limb ischemia [I99.8] PAD (peripheral artery disease) ([place]) [I73.9] ESRD on hemodialysis ([place]) [N18.6, Z99.2] Critical limb ischemia of right lower extremity with ulceration of foot ([place]) [I70.235]  [facility]/TTS/LUE AVG  End-stage renal disease on dialysis.  Will maintain outpatient schedule, if possible.  Currently receiving dialysis with UF goal 1L as tolerated. Next treatment scheduled for Saturday.  2. Anemia of chronic kidney disease Lab Results  Component Value Date   HGB 8.4 (L) 07/23/2021  Hgb below target. Will order Epo today.   3. Secondary Hyperparathyroidism:  Lab Results  Component Value Date   PTH 93 (H) 04/09/2019   CALCIUM 8.3 (L) 07/23/2021   PHOS 7.1 (H) 07/04/2021    Will continue to monitor bone mineral metabolism.   4.  Hypertension with chronic kidney disease.  Home regimen includes enalapril, minoxidil, and metoprolol.  Currently receiving metoprolol with as needed hydralazine available.  Placed on oral Cardizem yesterday.  BP 129/79  5.  Right lower extremity ischemia.  Vascular intervention on 07/20/2021.  Stents placed via angiogram.  Pain control.   LOS: 5   12/15/202212:04 PM

## 2021-07-23 NOTE — Progress Notes (Signed)
Physical Therapy Treatment Patient Details Name: Scott Castro MRN: [id number] DOB: 09-26-1955 Today's Date: 07/23/2021   History of Present Illness Scott Castro is a 65 y.o. male, who presented to the emergency department with a 72-monthhistory of pain in his right great toe.  He recently had ABIs that were diminished and had a right great toe pressure of 0. PMH includes: CKD on HD TTHS, hyperparathyroidism, and A-fib per EMR. Recently hospitalized for PNA 2/2 COVID on 07/03/21. S/P RLE angiography on 07/20/21 with L femoral approach.    PT Comments    Pt received in Semi-Fowler's position and agreeable to therapy.  Pt was able to navigate bed mobility without any concerns.  Pt able to come upright into standing x2 with ease.  Pt consistently needed encouragement throughout session in order to take fear away from being d/c.  Pt advised that neighbors would likely assist if asked and nursing/HHPT would be visiting to assist in his recovery at home.  Pt only needed one standing rest break during ambulation around the nursing station.  During ambulation, his respiratory rate stayed consistent, he was was able to carry on a conversation when ambulating, and HR stayed fairly consistent, elevating to 140 at one point during treatment during Afib.  Pt then transferred back to bed with no assistance necessary and with all needs met upon conclusion of services.  Current discharge plans to home with HHPT remain appropriate at this time.  Pt will continue to benefit from skilled therapy in order to address deficits listed below.    Recommendations for follow up therapy are one component of a multi-disciplinary discharge planning process, led by the attending physician.  Recommendations may be updated based on patient status, additional functional criteria and insurance authorization.  Follow Up Recommendations  Home health PT     Assistance Recommended at Discharge Intermittent Supervision/Assistance   Equipment Recommendations  Rolling walker (2 wheels)    Recommendations for Other Services       Precautions / Restrictions Precautions Precautions: Fall Restrictions Weight Bearing Restrictions: No     Mobility  Bed Mobility Overal bed mobility: Modified Independent             General bed mobility comments: use of bed rails, HOB slightly elevated. Performed with ease    Transfers Overall transfer level: Needs assistance Equipment used: Rolling walker (2 wheels) Transfers: Sit to/from Stand Sit to Stand: Min guard           General transfer comment: VC's for hand placement    Ambulation/Gait Ambulation/Gait assistance: Supervision Gait Distance (Feet): 160 Feet Assistive device: Rolling walker (2 wheels) Gait Pattern/deviations: Step-through pattern;Decreased stance time - right;Decreased step length - left;Antalgic Gait velocity: decreased     General Gait Details: Relies on BUE support on RW to offload RLE due to pain   Stairs             Wheelchair Mobility    Modified Rankin (Stroke Patients Only)       Balance Overall balance assessment: Needs assistance Sitting-balance support: No upper extremity supported;Feet supported Sitting balance-Leahy Scale: Good Sitting balance - Comments: Able to don socks seated EOB with anterior trunk leaning   Standing balance support: Bilateral upper extremity supported;During functional activity Standing balance-Leahy Scale: Fair Standing balance comment: Relies on UE support to off load RLE  Cognition Arousal/Alertness: Awake/alert Behavior During Therapy: WFL for tasks assessed/performed Overall Cognitive Status: Within Functional Limits for tasks assessed                                          Exercises      General Comments General comments (skin integrity, edema, etc.): HR rising to 140 at one point during Afib, however pretty  consistent otherwise, maintaining 90-100 BPM with ambulating.  Pt continues to have poor plet wave forms today.      Pertinent Vitals/Pain Pain Assessment: 0-10 Pain Score: 2  Pain Descriptors / Indicators: Discomfort;Aching Pain Intervention(s): Limited activity within patient's tolerance;Monitored during session;Repositioned    Home Living                          Prior Function            PT Goals (current goals can now be found in the care plan section) Acute Rehab PT Goals Patient Stated Goal: to return home PT Goal Formulation: With patient Time For Goal Achievement: 08/05/21 Potential to Achieve Goals: Good Progress towards PT goals: Progressing toward goals    Frequency    Min 2X/week      PT Plan      Co-evaluation              AM-PAC PT "6 Clicks" Mobility   Outcome Measure  Help needed turning from your back to your side while in a flat bed without using bedrails?: None Help needed moving from lying on your back to sitting on the side of a flat bed without using bedrails?: None Help needed moving to and from a bed to a chair (including a wheelchair)?: A Little Help needed standing up from a chair using your arms (e.g., wheelchair or bedside chair)?: A Little Help needed to walk in hospital room?: A Little Help needed climbing 3-5 steps with a railing? : A Lot 6 Click Score: 19    End of Session Equipment Utilized During Treatment: Gait belt;Oxygen Activity Tolerance: Patient tolerated treatment well;Patient limited by fatigue Patient left: in chair;with call bell/phone within reach Nurse Communication: Mobility status PT Visit Diagnosis: Other abnormalities of gait and mobility (R26.89);Muscle weakness (generalized) (M62.81)     Time: 1275-1700 PT Time Calculation (min) (ACUTE ONLY): 26 min  Charges:  $Gait Training: 23-37 mins                     Gwenlyn Saran, PT, DPT 07/23/21, 4:26 PM    Christie Nottingham 07/23/2021,  4:19 PM

## 2021-07-23 NOTE — Progress Notes (Addendum)
Mobility Specialist - Progress Note   07/23/21 1554  Mobility  Activity Ambulated in hall  Level of Assistance Minimal assist, patient does 75% or more  Assistive Device Front wheel walker  Distance Ambulated (ft) 80 ft  Mobility Ambulated with assistance in hallway  Mobility Response Tolerated well  Mobility performed by Mobility specialist  $Mobility charge 1 Mobility    O2 while resting on RA = 82% O2 while AMB on RA = n/a O2 while AMB on 3L = 88-91%   Pt sitting EOB on arrival, utilizing 2L. O2 sats recorded above. Difficult to determine O2 on multiple devices d/t poor pleth---ear pulse ox applied. O2 increased to 3L during ambulation to maintain sats >/= 88%. Pt denied SOB throughout session. Denies any distress. LOBx1 during turns, corrected by CGA. Pain initially a 2 but increases to 4 with activity. Pt left in bed with alarm set, back on 2L. RN notified.    Kathee Delton Mobility Specialist 07/23/21, 3:57 PM

## 2021-07-29 ENCOUNTER — Ambulatory Visit
Admission: RE | Admit: 2021-07-29 | Discharge: 2021-07-29 | Disposition: A | Discharge status: Home / Self Care | Payer: Medicare Other | Source: Ambulatory Visit | Attending: Specialist | Admitting: Specialist

## 2021-07-29 DIAGNOSIS — R059 Cough, unspecified: Secondary | ICD-10-CM | POA: Insufficient documentation

## 2021-07-29 DIAGNOSIS — R0609 Other forms of dyspnea: Secondary | ICD-10-CM | POA: Diagnosis present

## 2021-07-29 DIAGNOSIS — R918 Other nonspecific abnormal finding of lung field: Secondary | ICD-10-CM | POA: Insufficient documentation

## 2021-07-29 IMAGING — CT CT CHEST W/O CM
2 of 4 series · 14 of 36 positions shown, 17 images · non-contrast
Comparison: [DATE] and chest x-ray from [DATE].

CLINICAL DATA: History of cough in a 65-year-old male.

EXAM:
CT CHEST WITHOUT CONTRAST
TECHNIQUE: Multidetector CT imaging of the chest was performed following the
standard protocol without IV contrast.

[Series 2: thorax · axial · 0.62mm/px · z∈[-216,+70]mm · 11 of 169 slices shown, 14 images]
[im 13/169  mediastinal]
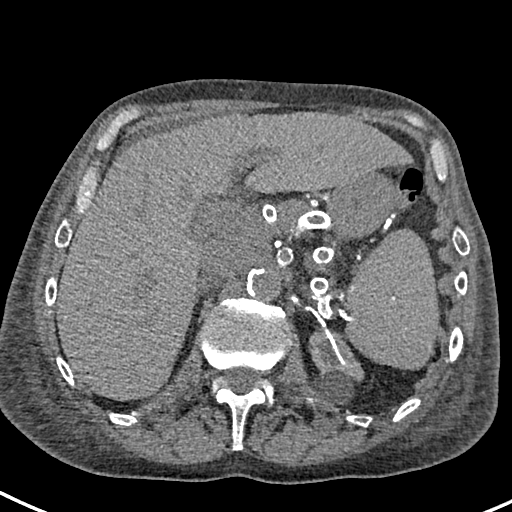
[im 13/169  lung]
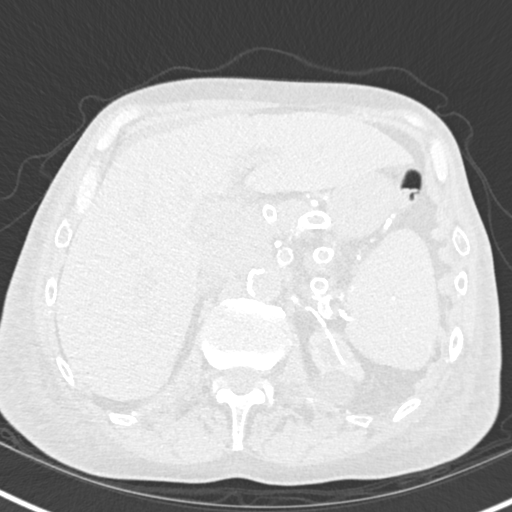
[im 26/169  lung]
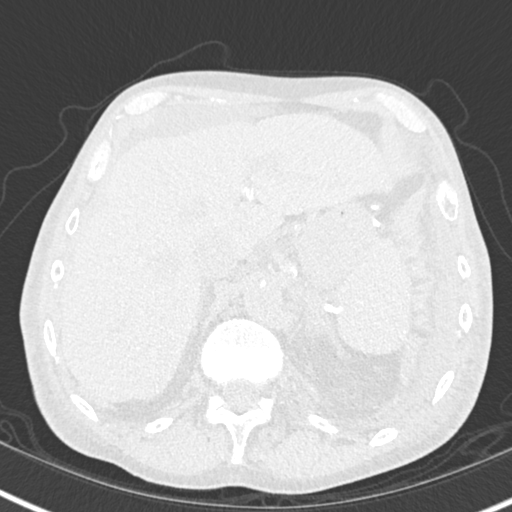
[im 39/169  lung]
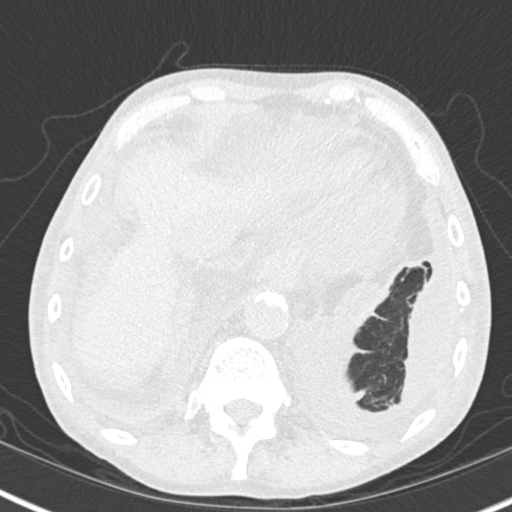
[im 52/169  lung]
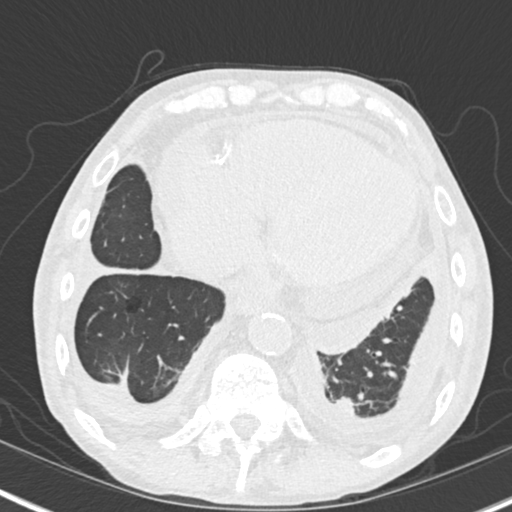
[im 65/169  mediastinal]
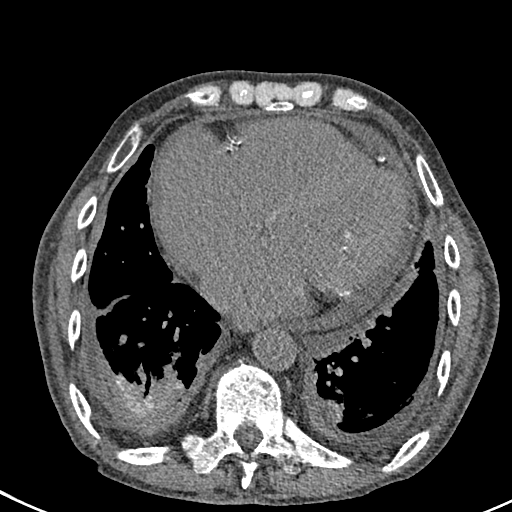
[im 65/169  lung]
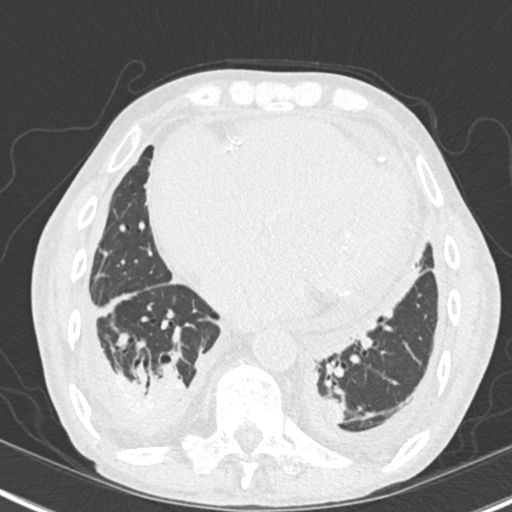
[im 91/169  lung]
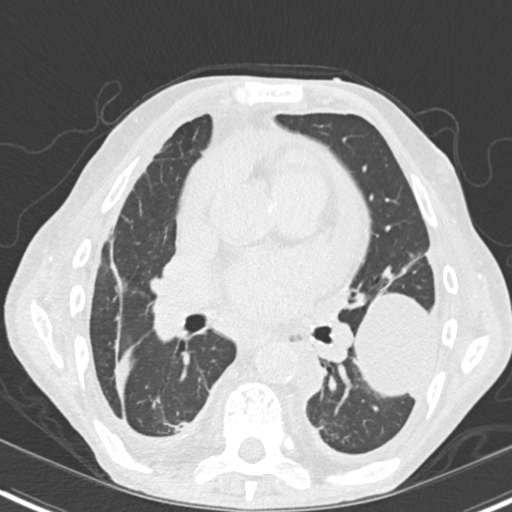
[im 104/169  lung]
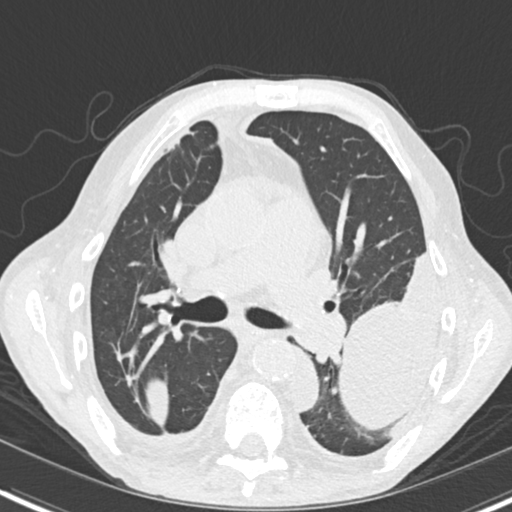
[im 117/169  lung]
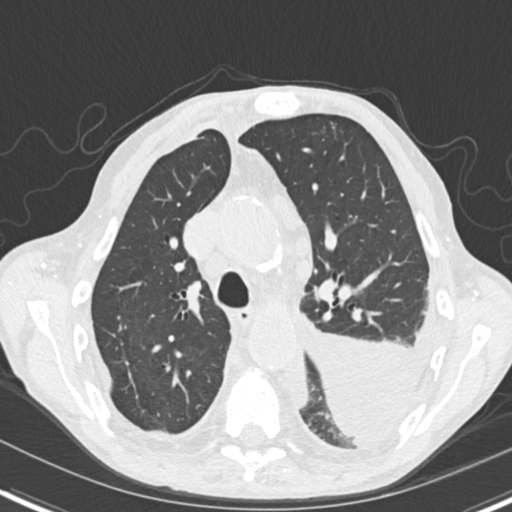
[im 130/169  mediastinal]
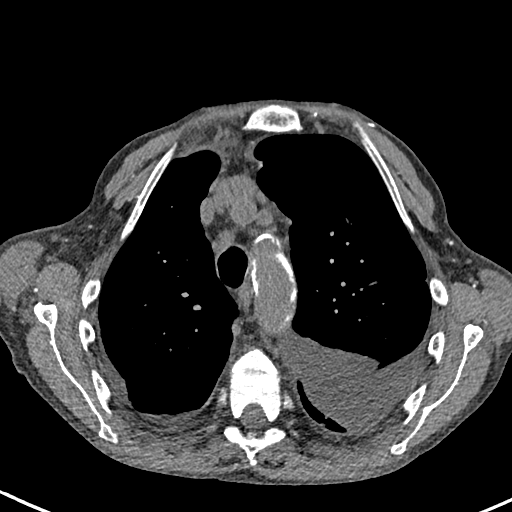
[im 130/169  lung]
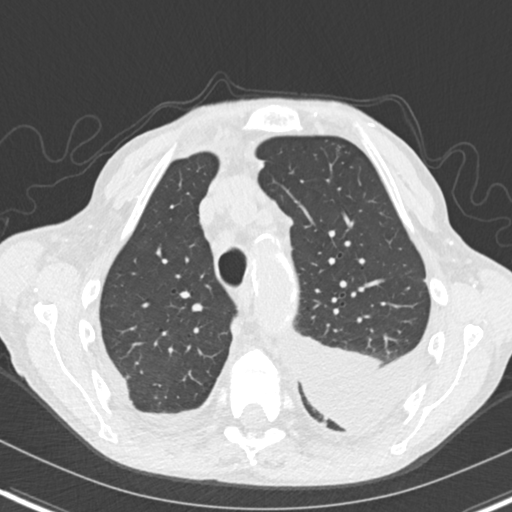
[im 143/169  lung]
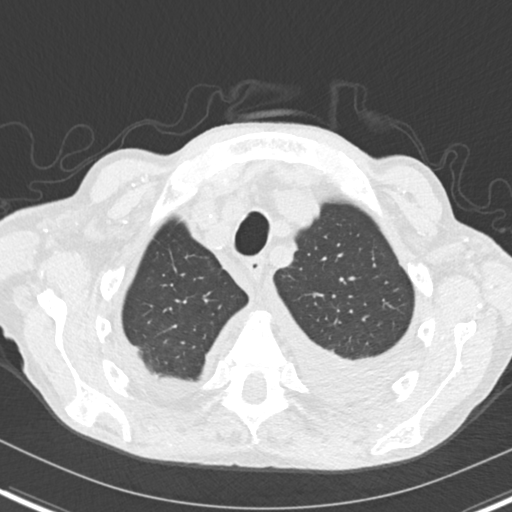
[im 156/169  lung]
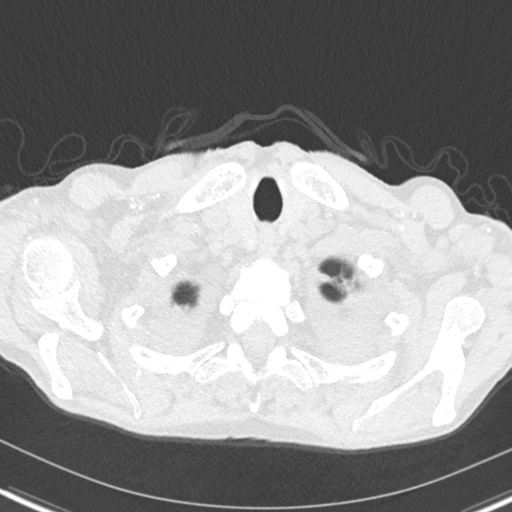

[Series 5: coronal · coronal · 0.65mm/px · 3 of 136 slices shown]
[im 28/136  lung]
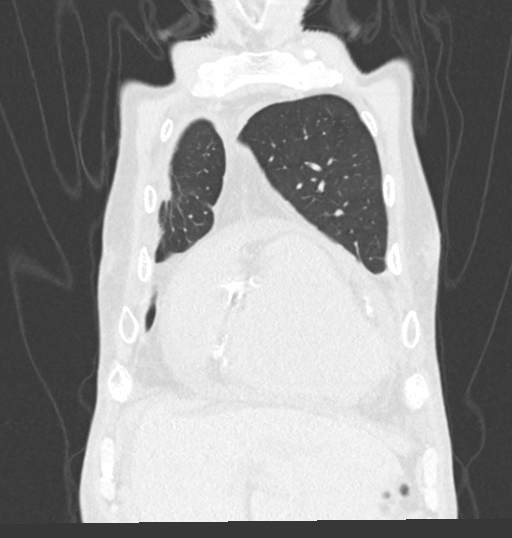
[im 55/136  lung]
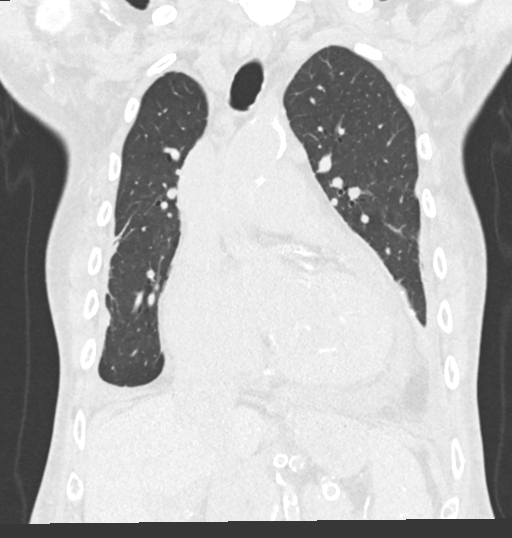
[im 82/136  lung]
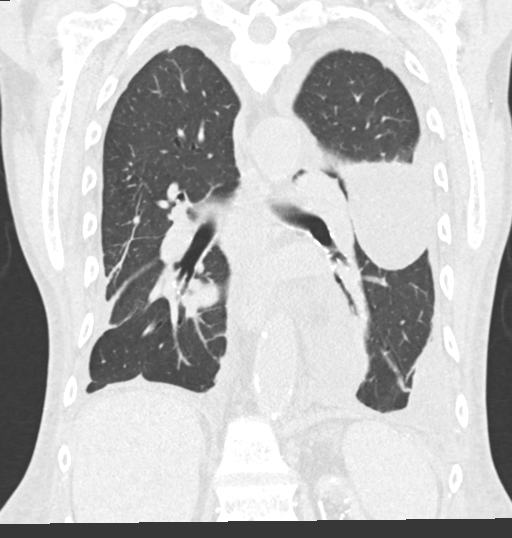

[14 of 36 positions shown; findings below may reference images not displayed]

FINDINGS: Cardiovascular: Calcified atheromatous plaque of the thoracic aorta.
No signs of aneurysmal dilation. Marked cardiac enlargement is
increased from previous imaging. Small to moderate pericardial
effusion. Three-vessel coronary artery calcification. Mitral annular
calcification. Central pulmonary vasculature is engorged to 3.2 cm.

Mediastinum/Nodes: Mild to moderately enlarged lymph nodes
throughout the chest largest is a RIGHT paratracheal node measuring
16 mm (image 58/2) scattered lymph nodes elsewhere in the chest
without signs of pathologic enlargement. Top-normal subcarinal lymph
node at 14 mm.

No hilar adenopathy. No axillary adenopathy. Small lymph nodes track
into the neck.

Lungs/Pleura: Signs of rounded atelectasis at the RIGHT lung base
associated with chronic small RIGHT-sided pleural effusion also with
mild chronic non nodular appearing pleural thickening. Pleural fluid
is mildly loculated and mildly increased from previous imaging in
the RIGHT chest.

Loculated fluid in the major fissure in the LEFT chest measuring
10.8 x 5.2 cm. This is contiguous with small chronic appearing
LEFT-sided pleural effusion with small to moderate sub pulmonic
component as well. Pleural fluid is also loculated along the medial
LEFT chest.

Developing rounded atelectasis is present at the LEFT lung base.

Micro nodular pattern with calcification in the anterior LEFT chest
is compatible with sequela of prior infection and is unchanged.

Bandlike subpleural scarring along the peripheral RIGHT chest has
developed since more remote imaging from [[place]]. No signs of focal
ground-glass or lobar level consolidation.

Upper Abdomen: Extensive calcification of visceral branches in the
upper abdomen. No acute upper abdominal process. Small volume
ascites.

Musculoskeletal: Renal osteodystrophy and spinal degenerative
changes. No destructive or acute bone process.
IMPRESSION: Increase in loculated pleural fluid particularly in the LEFT chest
since more remote imaging. This is associated with small to moderate
pericardial effusion and may be related to underlying volume
overload or heart failure and is associated with rounded atelectasis
at the lung bases. Loculated appearance more likely related to
chronicity but can be seen in the setting of chronic effusion or
empyema, no definitive signs infection currently such as associated
gas locules.

Bandlike subpleural scarring along the peripheral RIGHT chest has
developed since more remote imaging from [[place]] perhaps related to
recent infection.

Increase in size and lymph nodes in the chest largest at 16 mm.
These may be reactive or related to underlying cardiac dysfunction.
Suggest 3 month follow-up to assess for any changes.

Marked cardiac enlargement and coronary artery disease. Cardiac
enlargement is increased compared to previous imaging.

Small volume ascites further supporting the possibility of volume
overload or heart failure.

Renal osteodystrophy and spinal degenerative changes.

Aortic atherosclerosis.

Aortic Atherosclerosis ([AK]-[AK]).

## 2021-08-05 ENCOUNTER — Other Ambulatory Visit: Payer: Self-pay

## 2021-08-05 ENCOUNTER — Emergency Department: Payer: Medicare Other

## 2021-08-05 ENCOUNTER — Inpatient Hospital Stay
Admission: EM | Admit: 2021-08-05 | Discharge: 2021-08-12 | DRG: 239 | Disposition: A | Discharge status: Rehabilitation Facility | Payer: Medicare Other | Attending: Internal Medicine | Admitting: Internal Medicine

## 2021-08-05 DIAGNOSIS — L02415 Cutaneous abscess of right lower limb: Secondary | ICD-10-CM | POA: Diagnosis present

## 2021-08-05 DIAGNOSIS — E871 Hypo-osmolality and hyponatremia: Secondary | ICD-10-CM | POA: Diagnosis present

## 2021-08-05 DIAGNOSIS — M898X9 Other specified disorders of bone, unspecified site: Secondary | ICD-10-CM | POA: Diagnosis present

## 2021-08-05 DIAGNOSIS — Z992 Dependence on renal dialysis: Secondary | ICD-10-CM

## 2021-08-05 DIAGNOSIS — J449 Chronic obstructive pulmonary disease, unspecified: Secondary | ICD-10-CM | POA: Diagnosis present

## 2021-08-05 DIAGNOSIS — R918 Other nonspecific abnormal finding of lung field: Secondary | ICD-10-CM | POA: Diagnosis present

## 2021-08-05 DIAGNOSIS — I4891 Unspecified atrial fibrillation: Secondary | ICD-10-CM

## 2021-08-05 DIAGNOSIS — I70261 Atherosclerosis of native arteries of extremities with gangrene, right leg: Principal | ICD-10-CM | POA: Diagnosis present

## 2021-08-05 DIAGNOSIS — E8809 Other disorders of plasma-protein metabolism, not elsewhere classified: Secondary | ICD-10-CM | POA: Diagnosis present

## 2021-08-05 DIAGNOSIS — D631 Anemia in chronic kidney disease: Secondary | ICD-10-CM | POA: Diagnosis present

## 2021-08-05 DIAGNOSIS — Z8249 Family history of ischemic heart disease and other diseases of the circulatory system: Secondary | ICD-10-CM

## 2021-08-05 DIAGNOSIS — M86171 Other acute osteomyelitis, right ankle and foot: Secondary | ICD-10-CM | POA: Diagnosis present

## 2021-08-05 DIAGNOSIS — E785 Hyperlipidemia, unspecified: Secondary | ICD-10-CM | POA: Diagnosis present

## 2021-08-05 DIAGNOSIS — Z940 Kidney transplant status: Secondary | ICD-10-CM

## 2021-08-05 DIAGNOSIS — I120 Hypertensive chronic kidney disease with stage 5 chronic kidney disease or end stage renal disease: Secondary | ICD-10-CM | POA: Diagnosis present

## 2021-08-05 DIAGNOSIS — Z20822 Contact with and (suspected) exposure to covid-19: Secondary | ICD-10-CM | POA: Diagnosis present

## 2021-08-05 DIAGNOSIS — I480 Paroxysmal atrial fibrillation: Secondary | ICD-10-CM | POA: Diagnosis present

## 2021-08-05 DIAGNOSIS — L97519 Non-pressure chronic ulcer of other part of right foot with unspecified severity: Secondary | ICD-10-CM | POA: Diagnosis present

## 2021-08-05 DIAGNOSIS — M79676 Pain in unspecified toe(s): Secondary | ICD-10-CM

## 2021-08-05 DIAGNOSIS — Z8616 Personal history of COVID-19: Secondary | ICD-10-CM

## 2021-08-05 DIAGNOSIS — M62838 Other muscle spasm: Secondary | ICD-10-CM | POA: Diagnosis not present

## 2021-08-05 DIAGNOSIS — Z7901 Long term (current) use of anticoagulants: Secondary | ICD-10-CM | POA: Diagnosis not present

## 2021-08-05 DIAGNOSIS — L97509 Non-pressure chronic ulcer of other part of unspecified foot with unspecified severity: Secondary | ICD-10-CM | POA: Diagnosis not present

## 2021-08-05 DIAGNOSIS — E440 Moderate protein-calorie malnutrition: Secondary | ICD-10-CM | POA: Diagnosis present

## 2021-08-05 DIAGNOSIS — K219 Gastro-esophageal reflux disease without esophagitis: Secondary | ICD-10-CM | POA: Diagnosis present

## 2021-08-05 DIAGNOSIS — N186 End stage renal disease: Secondary | ICD-10-CM | POA: Diagnosis present

## 2021-08-05 DIAGNOSIS — I739 Peripheral vascular disease, unspecified: Secondary | ICD-10-CM | POA: Diagnosis not present

## 2021-08-05 DIAGNOSIS — Z7902 Long term (current) use of antithrombotics/antiplatelets: Secondary | ICD-10-CM

## 2021-08-05 DIAGNOSIS — K2270 Barrett's esophagus without dysplasia: Secondary | ICD-10-CM | POA: Diagnosis present

## 2021-08-05 DIAGNOSIS — J9611 Chronic respiratory failure with hypoxia: Secondary | ICD-10-CM | POA: Diagnosis not present

## 2021-08-05 DIAGNOSIS — D649 Anemia, unspecified: Secondary | ICD-10-CM

## 2021-08-05 DIAGNOSIS — E46 Unspecified protein-calorie malnutrition: Secondary | ICD-10-CM | POA: Diagnosis present

## 2021-08-05 DIAGNOSIS — N2581 Secondary hyperparathyroidism of renal origin: Secondary | ICD-10-CM | POA: Diagnosis present

## 2021-08-05 DIAGNOSIS — Z79899 Other long term (current) drug therapy: Secondary | ICD-10-CM

## 2021-08-05 DIAGNOSIS — L821 Other seborrheic keratosis: Secondary | ICD-10-CM | POA: Diagnosis present

## 2021-08-05 DIAGNOSIS — E213 Hyperparathyroidism, unspecified: Secondary | ICD-10-CM | POA: Diagnosis present

## 2021-08-05 DIAGNOSIS — I7025 Atherosclerosis of native arteries of other extremities with ulceration: Secondary | ICD-10-CM | POA: Diagnosis not present

## 2021-08-05 DIAGNOSIS — I998 Other disorder of circulatory system: Secondary | ICD-10-CM

## 2021-08-05 DIAGNOSIS — Z6823 Body mass index (BMI) 23.0-23.9, adult: Secondary | ICD-10-CM | POA: Diagnosis not present

## 2021-08-05 DIAGNOSIS — F329 Major depressive disorder, single episode, unspecified: Secondary | ICD-10-CM | POA: Diagnosis not present

## 2021-08-05 DIAGNOSIS — R9389 Abnormal findings on diagnostic imaging of other specified body structures: Secondary | ICD-10-CM | POA: Diagnosis not present

## 2021-08-05 DIAGNOSIS — L570 Actinic keratosis: Secondary | ICD-10-CM | POA: Diagnosis present

## 2021-08-05 DIAGNOSIS — Z89511 Acquired absence of right leg below knee: Secondary | ICD-10-CM | POA: Diagnosis not present

## 2021-08-05 LAB — COMPREHENSIVE METABOLIC PANEL
ALT: 9 U/L (ref 0–44)
AST: 14 U/L — ABNORMAL LOW (ref 15–41)
Albumin: 2.8 g/dL — ABNORMAL LOW (ref 3.5–5.0)
Alkaline Phosphatase: 92 U/L (ref 38–126)
Anion gap: 12 (ref 5–15)
BUN: 40 mg/dL — ABNORMAL HIGH (ref 8–23)
CO2: 28 mmol/L (ref 22–32)
Calcium: 8.4 mg/dL — ABNORMAL LOW (ref 8.9–10.3)
Chloride: 98 mmol/L (ref 98–111)
Creatinine, Ser: 7.58 mg/dL — ABNORMAL HIGH (ref 0.61–1.24)
GFR, Estimated: 7 mL/min — ABNORMAL LOW (ref >60)
Glucose, Bld: 98 mg/dL (ref 70–99)
Potassium: 5.1 mmol/L (ref 3.5–5.1)
Sodium: 138 mmol/L (ref 135–145)
Total Bilirubin: 0.7 mg/dL (ref 0.3–1.2)
Total Protein: 7.6 g/dL (ref 6.5–8.1)

## 2021-08-05 LAB — CBC WITH DIFFERENTIAL/PLATELET
Abs Immature Granulocytes: 0.04 10*3/uL (ref 0.00–0.07)
Basophils Absolute: 0.1 10*3/uL (ref 0.0–0.1)
Basophils Relative: 1 %
Eosinophils Absolute: 0.1 10*3/uL (ref 0.0–0.5)
Eosinophils Relative: 1 %
HCT: 33.9 % — ABNORMAL LOW (ref 39.0–52.0)
Hemoglobin: 10.5 g/dL — ABNORMAL LOW (ref 13.0–17.0)
Immature Granulocytes: 1 %
Lymphocytes Relative: 12 %
Lymphs Abs: 0.9 10*3/uL (ref 0.7–4.0)
MCH: 31.9 pg (ref 26.0–34.0)
MCHC: 31 g/dL (ref 30.0–36.0)
MCV: 103 fL — ABNORMAL HIGH (ref 80.0–100.0)
Monocytes Absolute: 0.9 10*3/uL (ref 0.1–1.0)
Monocytes Relative: 11 %
Neutro Abs: 5.9 10*3/uL (ref 1.7–7.7)
Neutrophils Relative %: 74 %
Platelets: 283 10*3/uL (ref 150–400)
RBC: 3.29 MIL/uL — ABNORMAL LOW (ref 4.22–5.81)
RDW: 16 % — ABNORMAL HIGH (ref 11.5–15.5)
WBC: 7.8 10*3/uL (ref 4.0–10.5)
nRBC: 0 % (ref 0.0–0.2)

## 2021-08-05 LAB — APTT
aPTT: 35 seconds (ref 24–36)
aPTT: 36 seconds (ref 24–36)

## 2021-08-05 LAB — CBC
HCT: 29.4 % — ABNORMAL LOW (ref 39.0–52.0)
Hemoglobin: 9.1 g/dL — ABNORMAL LOW (ref 13.0–17.0)
MCH: 31.6 pg (ref 26.0–34.0)
MCHC: 31 g/dL (ref 30.0–36.0)
MCV: 102.1 fL — ABNORMAL HIGH (ref 80.0–100.0)
Platelets: 248 10*3/uL (ref 150–400)
RBC: 2.88 MIL/uL — ABNORMAL LOW (ref 4.22–5.81)
RDW: 15.9 % — ABNORMAL HIGH (ref 11.5–15.5)
WBC: 6.8 10*3/uL (ref 4.0–10.5)
nRBC: 0 % (ref 0.0–0.2)

## 2021-08-05 LAB — RESP PANEL BY RT-PCR (FLU A&B, COVID) ARPGX2
Influenza A by PCR: NEGATIVE
Influenza B by PCR: NEGATIVE
SARS Coronavirus 2 by RT PCR: NEGATIVE

## 2021-08-05 LAB — PROTIME-INR
INR: 1.7 — ABNORMAL HIGH (ref 0.8–1.2)
Prothrombin Time: 20.3 seconds — ABNORMAL HIGH (ref 11.4–15.2)

## 2021-08-05 LAB — LACTIC ACID, PLASMA: Lactic Acid, Venous: 1.3 mmol/L (ref 0.5–1.9)

## 2021-08-05 LAB — HEPARIN LEVEL (UNFRACTIONATED): Heparin Unfractionated: >1.1 IU/mL — ABNORMAL HIGH (ref 0.30–0.70)

## 2021-08-05 IMAGING — US US EXTREM LOW VENOUS*R*
1 series · 14 of 24 positions shown · non-contrast
Comparison: RIGHT lower extremity venous duplex, [DATE]. RIGHT
foot XRs, [DATE].

CLINICAL DATA: Calf swelling.

EXAM:
RIGHT LOWER EXTREMITY VENOUS DOPPLER ULTRASOUND
TECHNIQUE: Gray-scale sonography with compression, as well as color and duplex
ultrasound, were performed to evaluate the deep venous system(s)
from the level of the common femoral vein through the popliteal and
proximal calf veins.

[Series 1: us venous img lower uni right (dvt) · portal-venous · 14 of 31 slices shown]
[im 1/31]
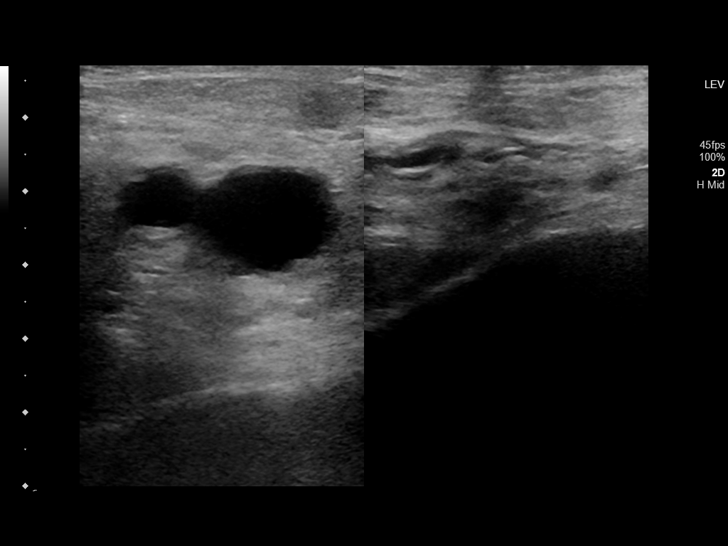
[im 3/31]
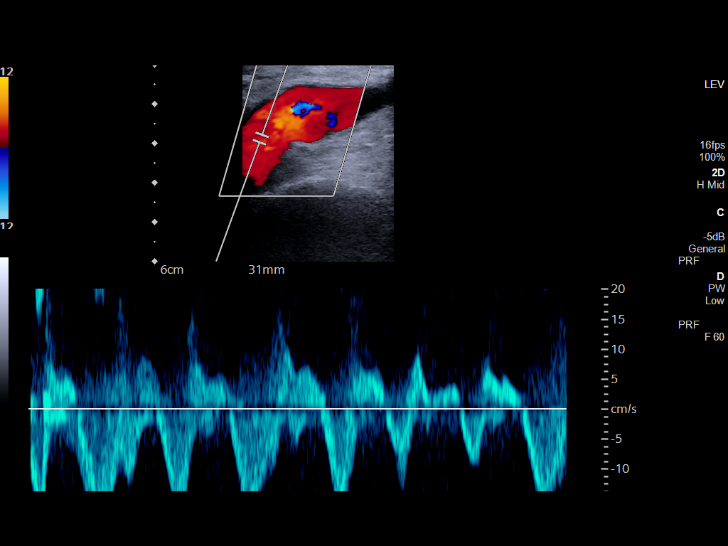
[im 6/31]
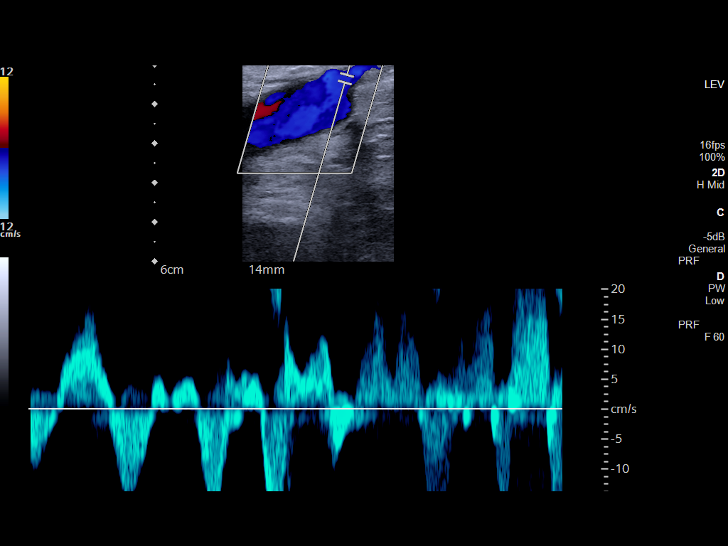
[im 8/31]
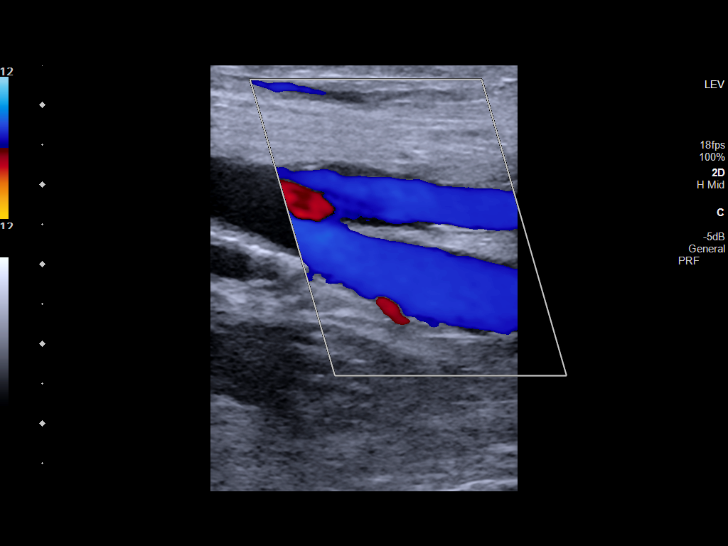
[im 10/31]
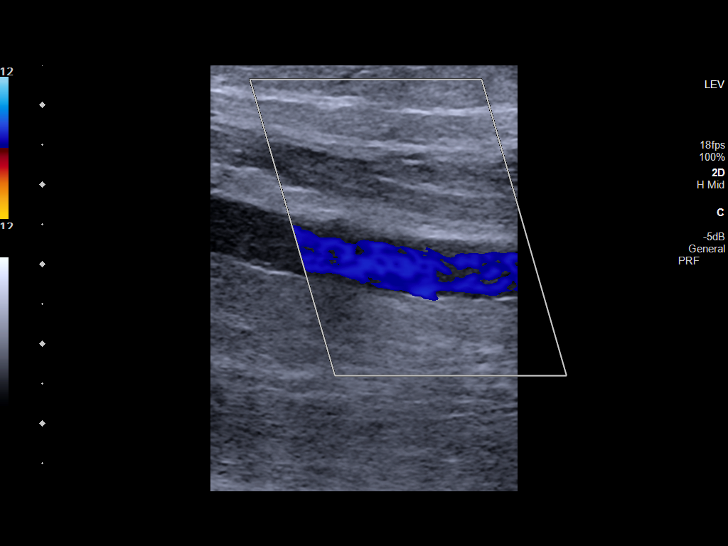
[im 12/31]
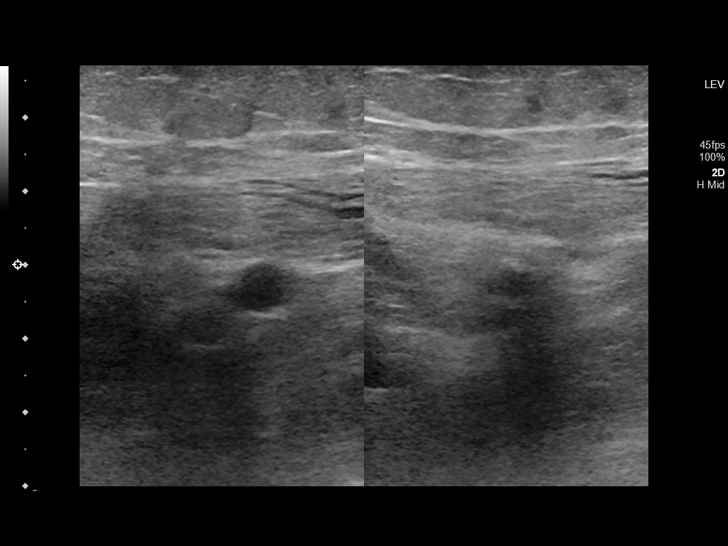
[im 15/31]
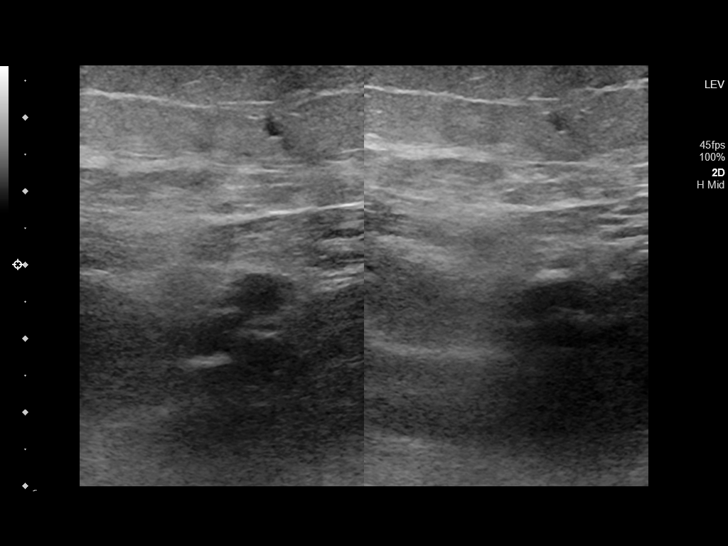
[im 16/31]
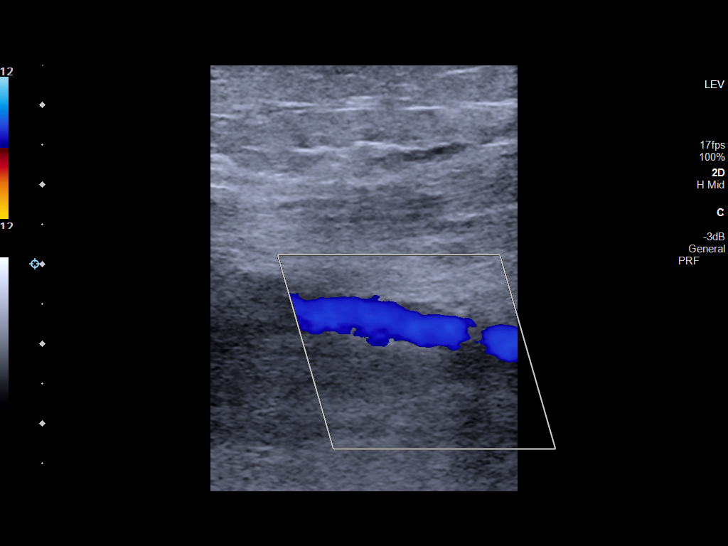
[im 19/31]
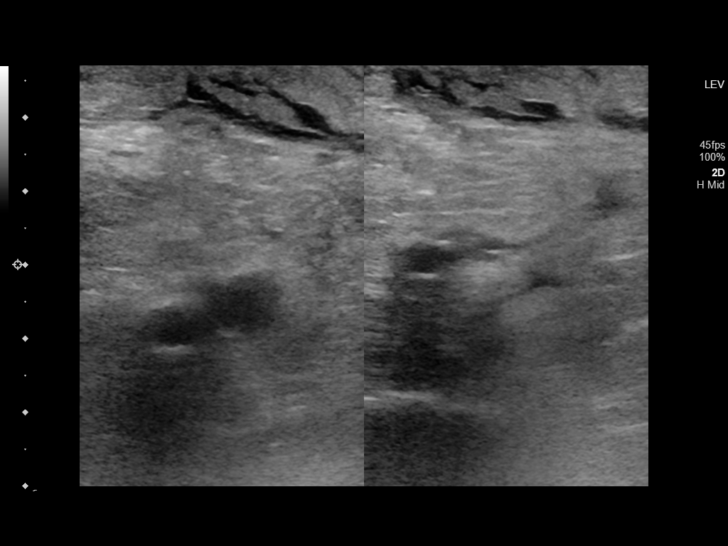
[im 21/31]
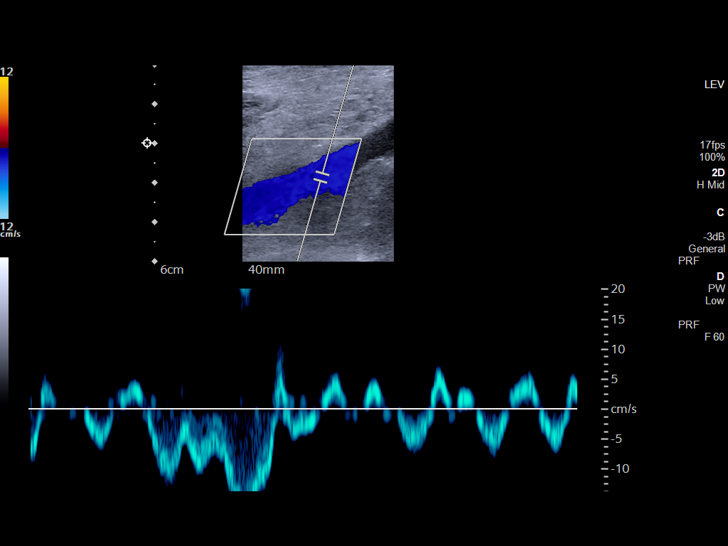
[im 24/31]
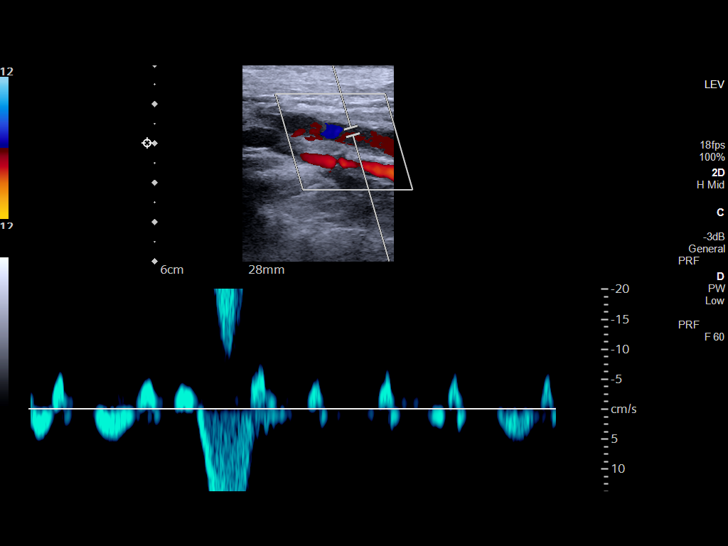
[im 25/31]
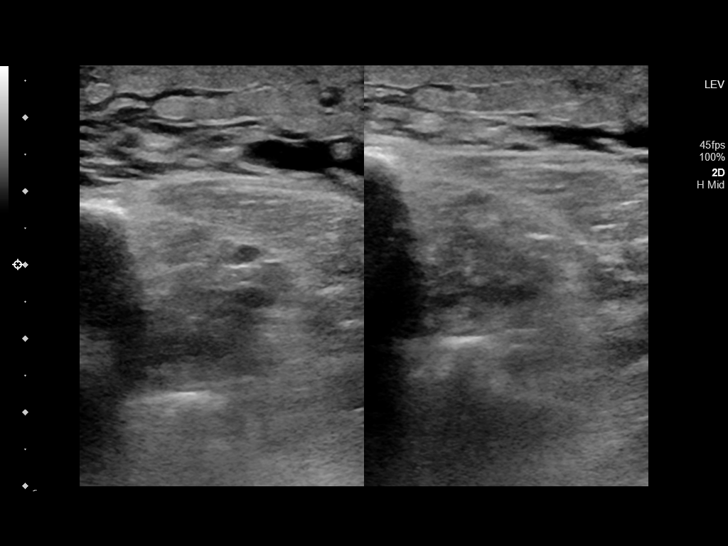
[im 28/31]
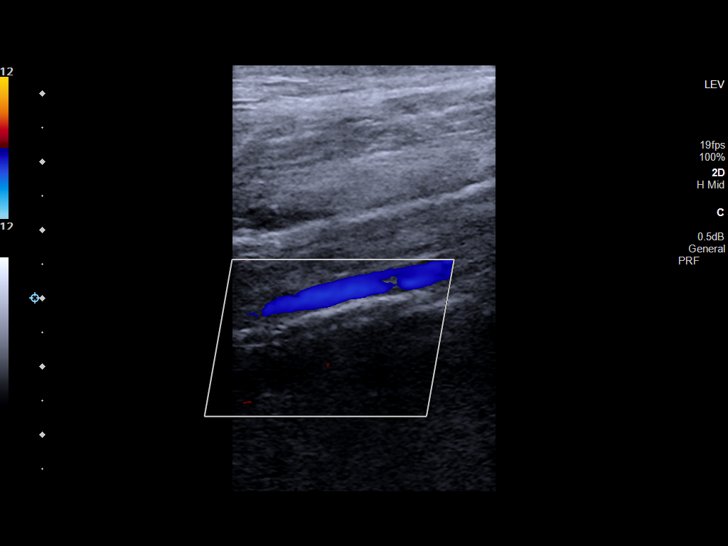
[im 31/31]
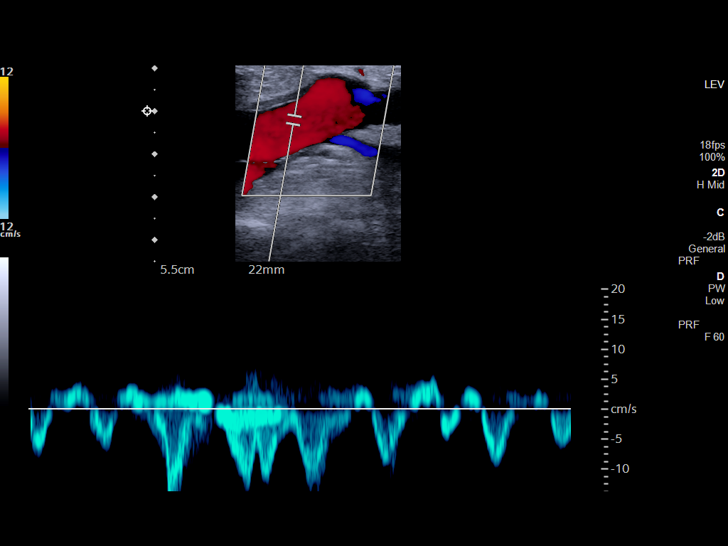

[14 of 24 positions shown; findings below may reference images not displayed]

FINDINGS: VENOUS

Normal compressibility of the RIGHT common femoral, superficial
femoral, and popliteal veins, as well as the visualized calf veins.
Visualized portions of profunda femoral vein and great saphenous
vein unremarkable. No filling defects to suggest DVT on grayscale or
color Doppler imaging. Doppler waveforms show normal direction of
venous flow, normal respiratory plasticity and response to
augmentation.

Limited views of the contralateral common femoral vein are
unremarkable.

OTHER

No evidence of superficial thrombophlebitis or abnormal fluid
collection.

Subcutaneous edema within the distal RIGHT lower extremity.

Limitations: none
IMPRESSION: No evidence of femoropopliteal DVT within the RIGHT lower extremity.

## 2021-08-05 IMAGING — DX DG FOOT COMPLETE 3+V*R*
3 series · 3 of 3 positions shown · non-contrast
Comparison: [DATE].

CLINICAL DATA: Infection and pain in a 65-year-old male.
Discoloration to the tips of the first, second and third toe.

EXAM:
RIGHT FOOT COMPLETE - 3+ VIEW

[foot ap]
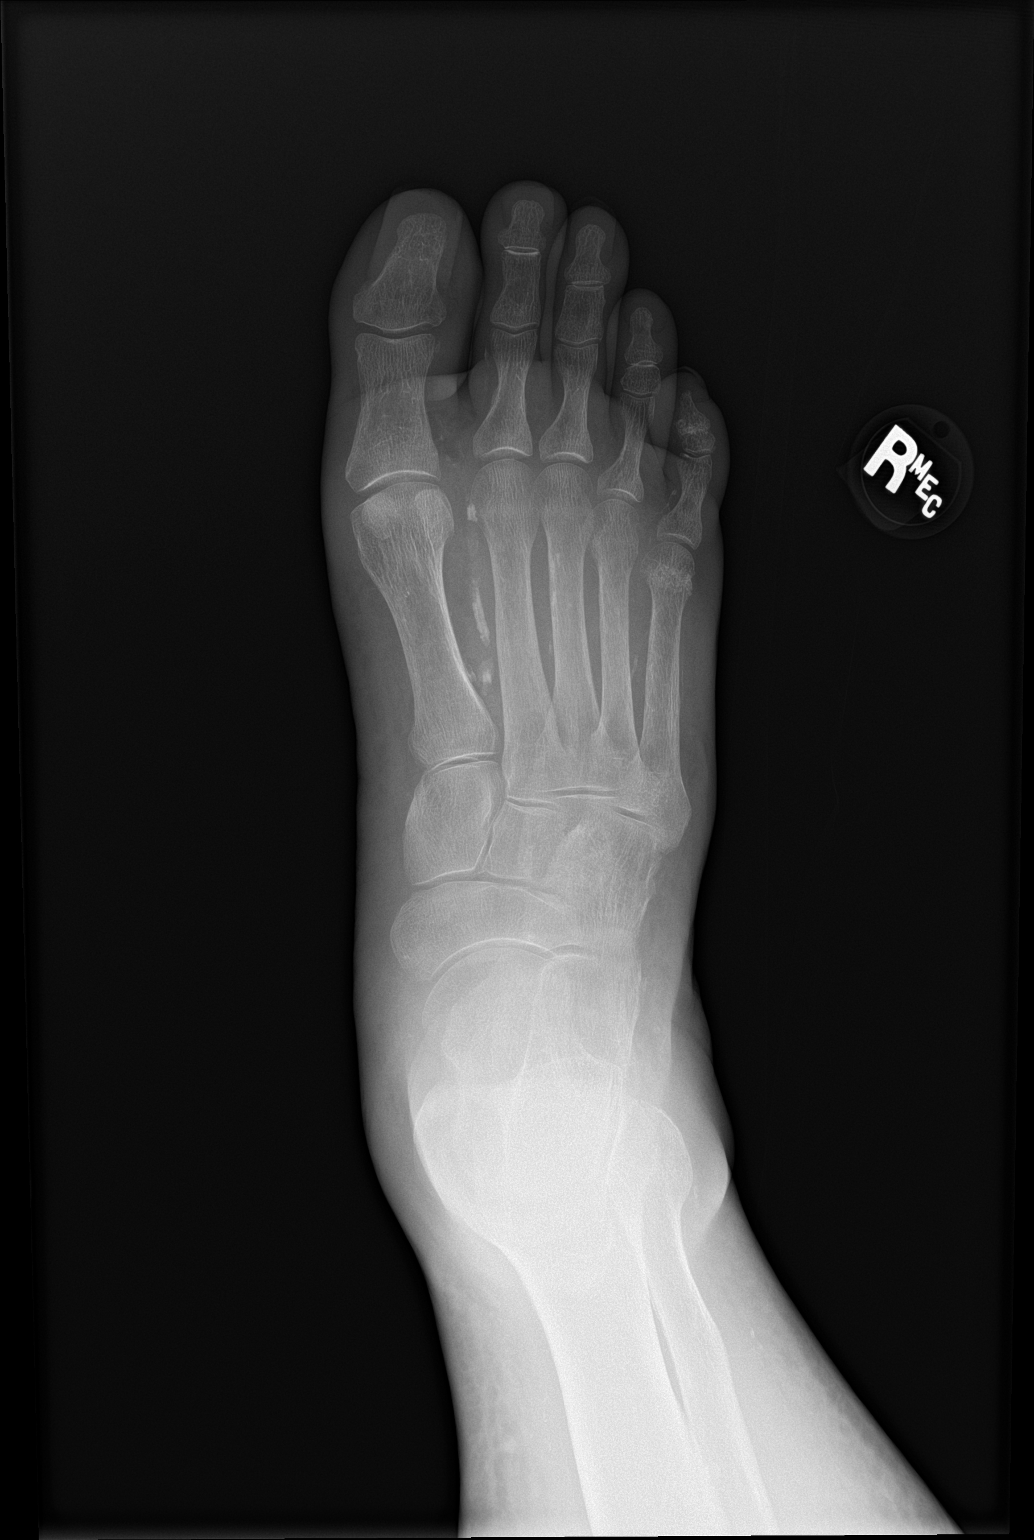

[foot obl]
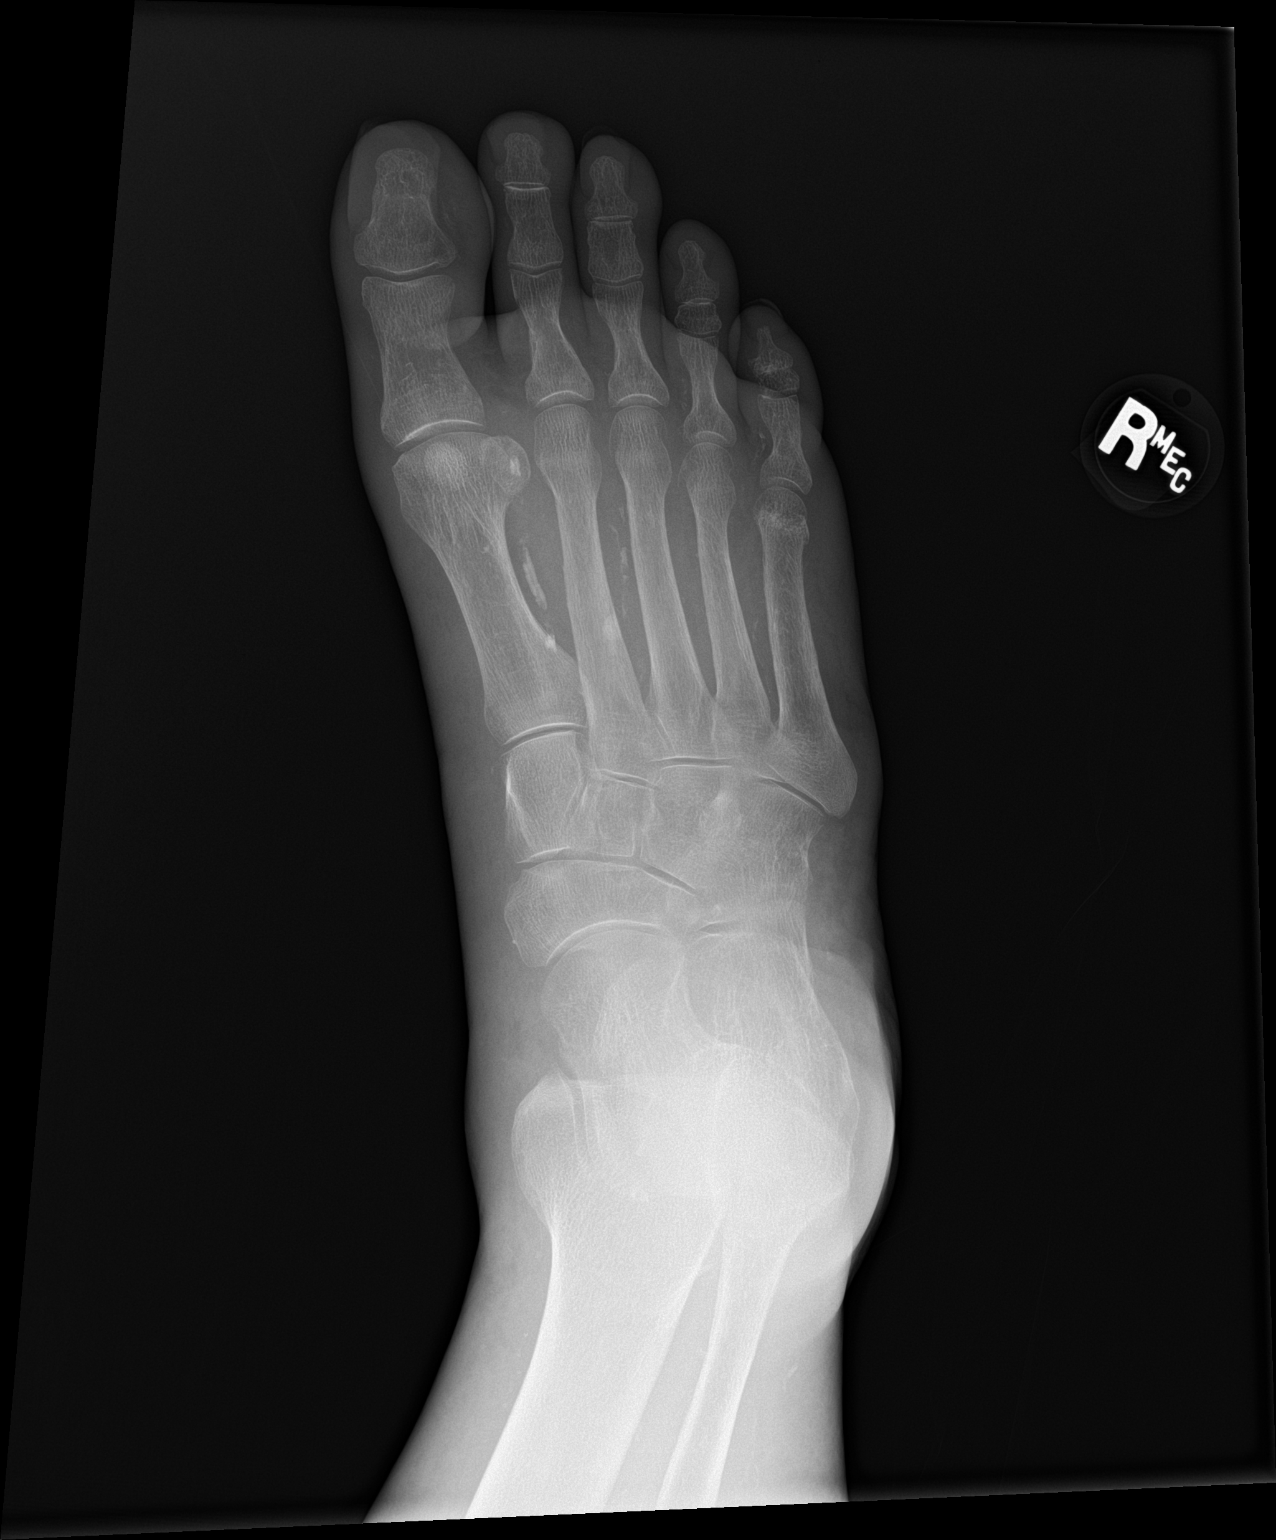

[foot lat]
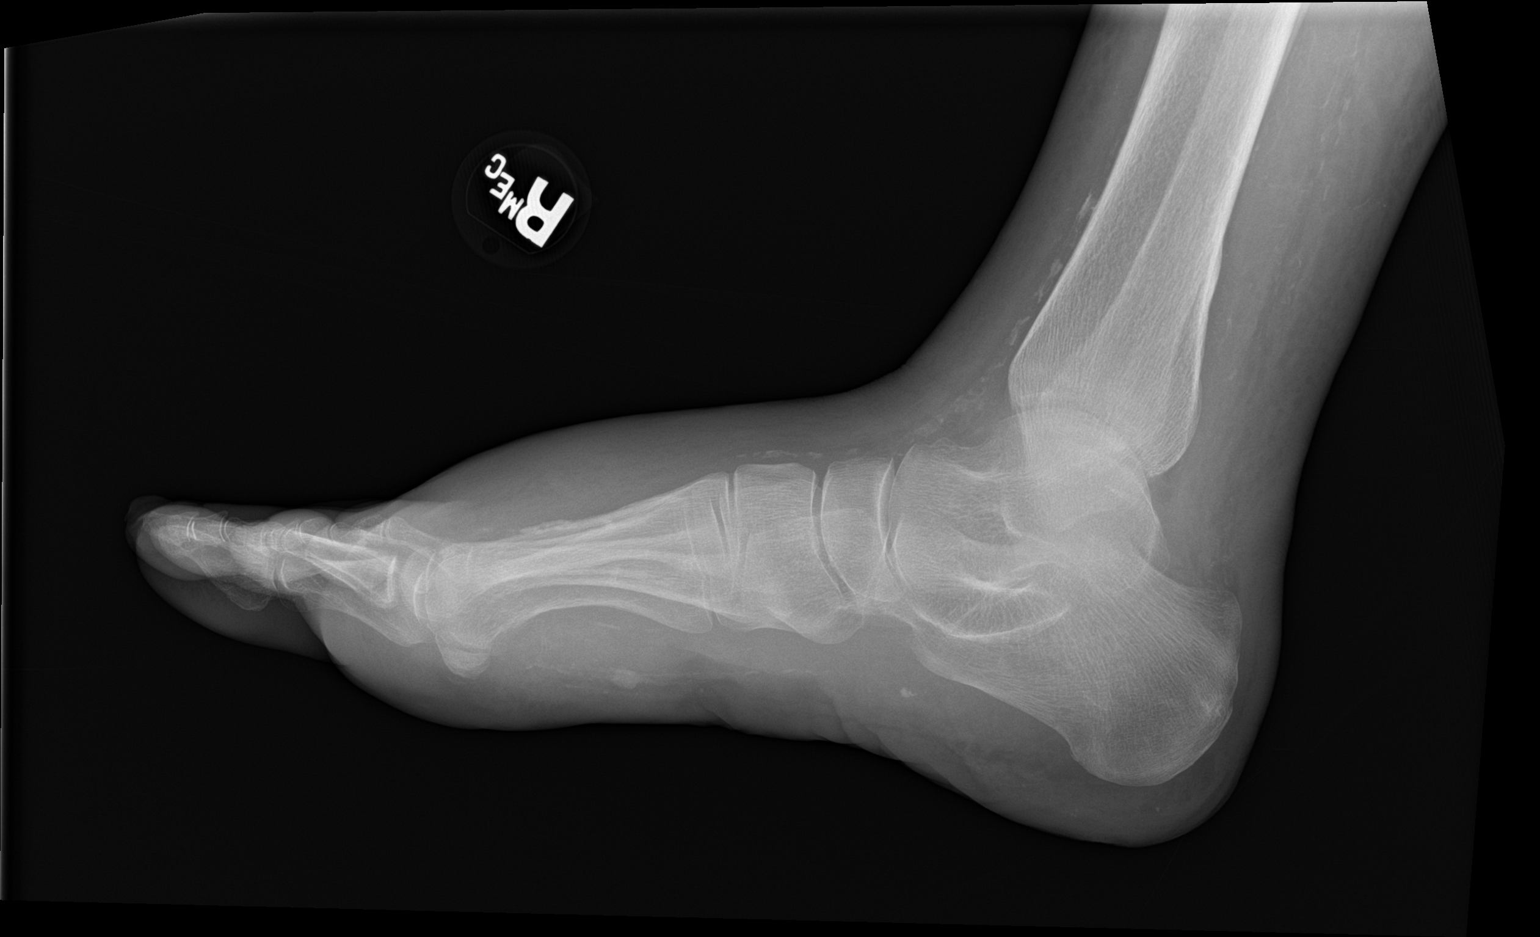

[3 of 3 positions shown; findings below may reference images not displayed]

FINDINGS: Vascular calcifications in the soft tissues. No discrete soft tissue
ulceration visible on radiograph

No bony changes to suggest osteomyelitis or acute bone finding.
Osteopenia.

Diffuse soft tissue swelling is however noted over the dorsum of the
foot and plantar aspect of the foot. This is worse in the forefoot.

Diffuse soft tissue swelling also in the visualized portions of the
LEFT lower extremity.
IMPRESSION: 1. No acute bone finding. No discrete soft tissue ulceration visible
on radiograph.
2. Diffuse severe soft tissue swelling over the dorsum of the foot
and plantar aspect of the foot and extending into the LEFT lower
extremity. Findings may represent severe edema and or infection.

## 2021-08-05 MED ORDER — ALBUTEROL SULFATE (2.5 MG/3ML) 0.083% IN NEBU: 3.0000 mL | INHALATION_SOLUTION | Freq: Four times a day (QID) | RESPIRATORY_TRACT | Status: DC | PRN

## 2021-08-05 MED ORDER — HYDROCODONE-ACETAMINOPHEN 7.5-325 MG PO TABS
1.0000 | ORAL_TABLET | Freq: Three times a day (TID) | ORAL | Status: DC | PRN
  Administered 2021-08-06 – 2021-08-11 (×13): 1 via ORAL
  Filled 2021-08-05 (×15): qty 1

## 2021-08-05 MED ORDER — CINACALCET HCL 30 MG PO TABS
60.0000 mg | ORAL_TABLET | Freq: Every day | ORAL | Status: DC
  Administered 2021-08-06 – 2021-08-11 (×6): 60 mg via ORAL
  Filled 2021-08-05 (×8): qty 2

## 2021-08-05 MED ORDER — ACETAMINOPHEN 500 MG PO TABS
1000.0000 mg | ORAL_TABLET | Freq: Once | ORAL | Status: AC
  Administered 2021-08-05: 18:00:00 1000 mg via ORAL
  Filled 2021-08-05: qty 2

## 2021-08-05 MED ORDER — HEPARIN SODIUM (PORCINE) 5000 UNIT/ML IJ SOLN
4000.0000 [IU] | Freq: Once | INTRAMUSCULAR | Status: AC
  Administered 2021-08-05: 19:00:00 4000 [IU] via INTRAVENOUS
  Filled 2021-08-05: qty 1

## 2021-08-05 MED ORDER — OXYCODONE HCL 5 MG PO TABS
5.0000 mg | ORAL_TABLET | Freq: Once | ORAL | Status: AC
  Administered 2021-08-05: 18:00:00 5 mg via ORAL
  Filled 2021-08-05: qty 1

## 2021-08-05 MED ORDER — SEVELAMER CARBONATE 800 MG PO TABS
2400.0000 mg | ORAL_TABLET | Freq: Three times a day (TID) | ORAL | Status: DC
  Administered 2021-08-06 – 2021-08-12 (×14): 2400 mg via ORAL
  Filled 2021-08-05 (×17): qty 3

## 2021-08-05 MED ORDER — ATORVASTATIN CALCIUM 10 MG PO TABS
10.0000 mg | ORAL_TABLET | Freq: Every day | ORAL | Status: DC
  Administered 2021-08-05 – 2021-08-11 (×7): 10 mg via ORAL
  Filled 2021-08-05 (×7): qty 1

## 2021-08-05 MED ORDER — ENSURE ENLIVE PO LIQD
237.0000 mL | Freq: Two times a day (BID) | ORAL | Status: DC
  Administered 2021-08-07 – 2021-08-12 (×7): 237 mL via ORAL

## 2021-08-05 MED ORDER — PANTOPRAZOLE SODIUM 40 MG PO TBEC
40.0000 mg | DELAYED_RELEASE_TABLET | Freq: Every day | ORAL | Status: DC
  Administered 2021-08-05 – 2021-08-12 (×6): 40 mg via ORAL
  Filled 2021-08-05 (×6): qty 1

## 2021-08-05 MED ORDER — OXYCODONE-ACETAMINOPHEN 5-325 MG PO TABS
1.0000 | ORAL_TABLET | Freq: Once | ORAL | Status: AC
  Administered 2021-08-05: 15:00:00 1 via ORAL
  Filled 2021-08-05: qty 1

## 2021-08-05 MED ORDER — HEPARIN (PORCINE) 25000 UT/250ML-% IV SOLN
1450.0000 [IU]/h | INTRAVENOUS | Status: DC
Start: 2021-08-05 — End: 2021-08-06
  Administered 2021-08-05: 23:00:00 1450 [IU]/h via INTRAVENOUS
  Filled 2021-08-05: qty 250

## 2021-08-05 MED ORDER — METOPROLOL TARTRATE 50 MG PO TABS
100.0000 mg | ORAL_TABLET | Freq: Two times a day (BID) | ORAL | Status: DC
  Administered 2021-08-05: 23:00:00 100 mg via ORAL
  Filled 2021-08-05 (×2): qty 2

## 2021-08-05 MED ORDER — DILTIAZEM HCL ER COATED BEADS 180 MG PO CP24
180.0000 mg | ORAL_CAPSULE | Freq: Every day | ORAL | Status: DC
  Administered 2021-08-05 – 2021-08-12 (×7): 180 mg via ORAL
  Filled 2021-08-05 (×8): qty 1

## 2021-08-05 NOTE — ED Provider Notes (Signed)
[facility] Emergency Department Provider Note ____________________________________________   Event Date/Time   First MD Initiated Contact with Patient 08/05/21 1718     (approximate)  I have reviewed the triage vital signs and the nursing notes.  HISTORY  Chief Complaint Foot Pain and Leg Swelling   HPI Scott Castro is a 65 y.o. malewho presents to the ED for evaluation of right foot and leg pain.  Chart review indicates history of ESRD on dialysis TThS, COPD on 2-3LNC, atrial fibrillation Recent medical admission a couple weeks ago at our facility due to critical limb ischemia requiring heparinization and vascular intervention with angioplasty to the right anterior tibial artery, tibial peroneal trunk and popliteal artery.  He is on Plavix and Eliquis  Patient presents to the ED from home via POV for evaluation of worsening redness, pain and blisters to his right foot and toes.  He reports initially that the angiogram and balloons helped his foot and lower leg, but over the past couple days his foot has been getting worse with erythema and developing his blisters over his toes. He denies any fever, chills or systemic symptoms beyond the worsening pain, erythema and blisters to his foot.  Reports compliance with his Plavix and Eliquis.  Reports 8/10 intensity pain and requesting analgesia since the Norco from triage has worn off.  Pain is constant, burning and aching and nonradiating.  Past Medical History:  Diagnosis Date   Barrett's esophagus    Chronic kidney disease    CHRONIC\   Glomerulosclerosis, focal    Hyperparathyroidism due to renal insufficiency [facility])     Patient Active Problem List   Diagnosis Date Noted   Lower limb ischemia 07/18/2021   Atrial fibrillation with RVR ([place]) 07/18/2021   Pneumonia due to COVID-19 virus 07/03/2021   Hypoalbuminemia due to protein-calorie malnutrition ([place]) 07/03/2021   Macrocytic anemia 07/03/2021   PAD  (peripheral artery disease) ([place]) 07/03/2021   GERD (gastroesophageal reflux disease) 07/03/2021   Mixed hyperlipidemia 07/03/2021   Sepsis ([place]) 12/12/2020   Bicipital tenosynovitis 12/04/2020   Localized, primary osteoarthritis of hand 12/04/2020   Hyperkalemia    COPD (chronic obstructive pulmonary disease) ([place]) 04/10/2019   Nonintractable epilepsy with complex partial seizures ([place]) 08/20/2014   Essential hypertension 01/09/2013   Lung nodule, multiple 07/28/2012   Organ transplant candidate 02/04/2012   Arteriovenous fistula ([place]) 01/21/2012   At risk for dental problems 01/21/2012   Barrett esophagus 01/21/2012   FSGS (focal segmental glomerulosclerosis) 01/21/2012   Kidney transplanted 01/21/2012    Past Surgical History:  Procedure Laterality Date   AV FISTULA PLACEMENT     COLONOSCOPY N/A 03/22/2016   Procedure: COLONOSCOPY;  Surgeon: Manya Silvas, MD;  Location: [place];  Service: Endoscopy;  Laterality: N/A;   DG AV DIALYSIS GRAFT DECLOT OR     ESOPHAGOGASTRODUODENOSCOPY (EGD) WITH PROPOFOL  03/22/2016   Procedure: ESOPHAGOGASTRODUODENOSCOPY (EGD) WITH PROPOFOL;  Surgeon: Manya Silvas, MD;  Location: [facility] ENDOSCOPY;  Service: Endoscopy;;   FLEXIBLE BRONCHOSCOPY     KIDNEY TRANSPLANT Right 1985   LOWER EXTREMITY ANGIOGRAPHY Right 07/20/2021   Procedure: Lower Extremity Angiography;  Surgeon: Algernon Huxley, MD;  Location: [place] [facility];  Service: Cardiovascular;  Laterality: Right;   REMOVAL TENCKHOFF CATH      Prior to Admission medications   Medication Sig Start Date End Date Taking? Authorizing Provider  albuterol (VENTOLIN HFA) 108 (90 Base) MCG/ACT inhaler Inhale 2 puffs into the lungs every 6 (six)  hours as needed for wheezing or shortness of breath. 07/08/21   Enzo Bi, MD  apixaban (ELIQUIS) 5 MG TABS tablet Take 1 tablet (5 mg total) by mouth 2 (two) times daily. 07/22/21   Sharen Hones, MD  atorvastatin (LIPITOR) 10 MG tablet Take 1 tablet  (10 mg total) by mouth at bedtime. 07/22/21   Sharen Hones, MD  cinacalcet (SENSIPAR) 60 MG tablet Take 60 mg by mouth daily.    [provider]  clopidogrel (PLAVIX) 75 MG tablet Take 1 tablet (75 mg total) by mouth daily. 07/23/21   Sharen Hones, MD  diclofenac Sodium (VOLTAREN) 1 % GEL Apply 2 g topically 4 (four) times daily. Use on feet    [provider]  diltiazem (CARDIZEM CD) 180 MG 24 hr capsule Take 1 capsule (180 mg total) by mouth daily. 07/23/21   Sharen Hones, MD  metoprolol (LOPRESSOR) 100 MG tablet Take 100 mg by mouth 2 (two) times daily.    [provider]  pantoprazole (PROTONIX) 40 MG tablet Take 40 mg by mouth daily. 03/27/19   [provider]  sevelamer carbonate (RENVELA) 800 MG tablet Take by mouth See admin instructions. Take 2400 mg by mouth three times daily, 1600 mg with snacks    [provider]    Allergies Patient has no known allergies.  Family History  Problem Relation Age of Onset   Heart disease Mother    Heart disease Father     Social History Social History   Tobacco Use   Smoking status: Never   Smokeless tobacco: Never  Substance Use Topics   Alcohol use: Not Currently    Comment: very little   Drug use: No    Review of Systems  Constitutional: No fever/chills Eyes: No visual changes. ENT: No sore throat. Cardiovascular: Denies chest pain. Respiratory: Denies shortness of breath. Gastrointestinal: No abdominal pain.  No nausea, no vomiting.  No diarrhea.  No constipation. Genitourinary: Negative for dysuria. Musculoskeletal: Negative for back pain. Positive for atraumatic right foot swelling and pain. Skin: Negative for rash. Neurological: Negative for headaches, focal weakness or numbness.  ____________________________________________   PHYSICAL EXAM:  VITAL SIGNS: Vitals:   08/05/21 1435 08/05/21 1804  BP: 103/85 110/74  Pulse: (!) 108 94  Resp: 18 18  Temp: 98.6 F (37 C)    SpO2: 98% 96%    Constitutional: Alert and oriented. Well appearing and in no acute distress.  Well-appearing.  Sitting up in bed and conversational in full sentences. Eyes: Conjunctivae are normal. PERRL. EOMI. Head: Atraumatic. Nose: No congestion/rhinnorhea. Mouth/Throat: Mucous membranes are moist.  Oropharynx non-erythematous. Neck: No stridor. No cervical spine tenderness to palpation. Cardiovascular: Normal rate, regular rhythm. Respiratory: Normal respiratory effort.  No retractions. Lungs CTAB. Gastrointestinal: Soft , nondistended, nontender to palpation.  Musculoskeletal:  Right foot is asymmetric in appearance to the left.  Obviously more erythematous to the foot diffusely without raised areas or focal areas of rash.  Is more swollen compared to the left and feels more cool to the touch.  Unable to reliably palpate a DP pulse.  New blisters to the dorsal aspect of the great toe as well as the tips of the first 3 toes, as pictured below.  Tender to palpation.  Neurologic:  Normal speech and language. No gross focal neurologic deficits are appreciated. No gait instability noted. Skin:  Skin is warm, dry and intact. No rash noted. Psychiatric: Mood and affect are normal. Speech and behavior are normal.  ____________________________________________   LABS (all labs ordered are listed, but only abnormal results are displayed)  Labs Reviewed  COMPREHENSIVE METABOLIC PANEL - Abnormal; Notable for the following components:      Result Value   BUN 40 (*)    Creatinine, Ser 7.58 (*)    Calcium 8.4 (*)    Albumin 2.8 (*)    AST 14 (*)    GFR, Estimated 7 (*)    All other components within normal limits  CBC WITH DIFFERENTIAL/PLATELET - Abnormal; Notable for the following components:   RBC 3.29 (*)    Hemoglobin 10.5 (*)    HCT 33.9 (*)    MCV 103.0 (*)    RDW 16.0 (*)    All other components within normal limits  CULTURE, BLOOD (ROUTINE X 2)  CULTURE, BLOOD  (ROUTINE X 2)  RESP PANEL BY RT-PCR (FLU A&B, COVID) ARPGX2  LACTIC ACID, PLASMA  APTT  PROTIME-INR   ____________________________________________  12 Lead EKG   ____________________________________________  RADIOLOGY  ED MD interpretation: Plain film of the right foot reviewed by me without evidence of bony injury or bony erosion  Official radiology report(s): US Venous Img Lower Right (DVT Study)  Result Date: 08/05/2021 CLINICAL DATA:  Calf swelling. EXAM: RIGHT LOWER EXTREMITY VENOUS DOPPLER ULTRASOUND TECHNIQUE: Gray-scale sonography with compression, as well as color and duplex ultrasound, were performed to evaluate the deep venous system(s) from the level of the common femoral vein through the popliteal and proximal calf veins. COMPARISON:  RIGHT lower extremity venous duplex, 07/04/2021. RIGHT foot XRs, 08/05/2021. FINDINGS: VENOUS Normal compressibility of the RIGHT common femoral, superficial femoral, and popliteal veins, as well as the visualized calf veins. Visualized portions of profunda femoral vein and great saphenous vein unremarkable. No filling defects to suggest DVT on grayscale or color Doppler imaging. Doppler waveforms show normal direction of venous flow, normal respiratory plasticity and response to augmentation. Limited views of the contralateral common femoral vein are unremarkable. OTHER No evidence of superficial thrombophlebitis or abnormal fluid collection. Subcutaneous edema within the distal RIGHT lower extremity. Limitations: none IMPRESSION: No evidence of femoropopliteal DVT within the RIGHT lower extremity. Michaelle Birks, MD [facility] Radiology Electronically Signed   By: Michaelle Birks M.D.   On: 08/05/2021 16:16   DG Foot Complete Right  Result Date: 08/05/2021 CLINICAL DATA:  Infection and pain in a 65 year old male. Discoloration to the tips of the first, second and third toe. EXAM: RIGHT FOOT COMPLETE -  3+ VIEW COMPARISON:  Jan 02, 2021. FINDINGS: Vascular calcifications in the soft tissues. No discrete soft tissue ulceration visible on radiograph No bony changes to suggest osteomyelitis or acute bone finding. Osteopenia. Diffuse soft tissue swelling is however noted over the dorsum of the foot and plantar aspect of the foot. This is worse in the forefoot. Diffuse soft tissue swelling also in the visualized portions of the LEFT lower extremity. IMPRESSION: 1. No acute bone finding. No discrete soft tissue ulceration visible on radiograph. 2. Diffuse severe soft tissue swelling over the dorsum of the foot and plantar aspect of the foot and extending into the LEFT lower extremity. Findings may represent severe edema and or infection. Electronically Signed   By: Zetta Bills M.D.   On: 08/05/2021 15:13    ____________________________________________   PROCEDURES and INTERVENTIONS  Procedure(s) performed (including Critical Care):  .Critical Care Performed by: Vladimir Crofts, MD Authorized by: Vladimir Crofts, MD   Critical care provider statement:    Critical  care time (minutes):  30   Critical care time was exclusive of:  Separately billable procedures and treating other patients   Critical care was necessary to treat or prevent imminent or life-threatening deterioration of the following conditions:  Circulatory failure   Critical care was time spent personally by me on the following activities:  Development of treatment plan with patient or surrogate, discussions with consultants, evaluation of patient's response to treatment, examination of patient, ordering and review of laboratory studies, ordering and review of radiographic studies, ordering and performing treatments and interventions, pulse oximetry, re-evaluation of patient's condition and review of old charts  Medications  heparin injection 4,000 Units (has no administration in time range)  oxyCODONE-acetaminophen (PERCOCET/ROXICET) 5-325 MG  per tablet 1 tablet (1 tablet Oral Given 08/05/21 1440)  acetaminophen (TYLENOL) tablet 1,000 mg (1,000 mg Oral Given 08/05/21 1804)  oxyCODONE (Oxy IR/ROXICODONE) immediate release tablet 5 mg (5 mg Oral Given 08/05/21 1803)    ____________________________________________   MDM / ED COURSE   65 year old male with known severe PAD to his right lower leg presents to the ED with worsening symptoms, requiring heparinization and medical admission for planned angiography.  Vitals and blood work are benign.  Stigmata of ESRD around baseline.  No evidence of sepsis.  While his foot is erythematous, it is not warm and I have a lower suspicion for cellulitis and I anticipate this is more of a reaction to worsening arterial ischemia.  Venous ultrasound without stigmata of a DVT and x-ray of the foot without signs of osteo-.  Do not see any indications for antibiotics at this time.  We will heparinize and discussed with medicine for admission.  Clinical Course as of 08/05/21 1815  Wed Aug 05, 2021  1812 I discussed with Dr. Lucky Cowboy, we discussed pertinent findings and his recent angioplasty.  He recommends heparinization and admitted to medicine.  They will perform angiography in the morning. [DS]    Clinical Course User Index [DS] Vladimir Crofts, MD    ____________________________________________   FINAL CLINICAL IMPRESSION(S) / ED DIAGNOSES  Final diagnoses:  Limb ischemia     ED Discharge Orders     None        Sarita Hakanson   Note:  This document was prepared using Dragon voice recognition software and may include unintentional dictation errors.    Vladimir Crofts, MD 08/05/21 [phone]

## 2021-08-05 NOTE — ED Triage Notes (Signed)
Pt had 3 stents placed in his RLE 2 weeks ago and since having progressive pain and swelling with discoloration to the 1st, 2nd, 3rd toe with wounds to the tip of the toes

## 2021-08-05 NOTE — Assessment & Plan Note (Signed)
Compliant with Tuesday/Thursday/Saturday dialysis.  Nephrology consult placed for inpatient dialysis.

## 2021-08-05 NOTE — Assessment & Plan Note (Signed)
Stable, no chest pain or palpitations

## 2021-08-05 NOTE — Assessment & Plan Note (Addendum)
Recent angio intervention, balloon treatment no stents 07/20/21.  Now presenting with cold right foot with new blistering, ED spoke to Dr. Lucky Cowboy who will see patient tomorrow.  Patient has been compliant with home Plavix and Eliquis.  Was transition to heparin while inpatient in anticipation of possible procedure.  No leukocytosis, vital stable, lactate within normal limits, blood cultures pending, ultrasound lower extremity negative for DVT. Heparin gtt.

## 2021-08-05 NOTE — ED Notes (Signed)
Pain reassessment reflects medications given in Triage.

## 2021-08-05 NOTE — Assessment & Plan Note (Signed)
-   Continue home medication 

## 2021-08-05 NOTE — Consult Note (Addendum)
[place] for heparin Indication: PAD with right foot ischemia  No Known Allergies  Patient Measurements: Height: 5\' 9"  (175.3 cm) Weight: 72.6 kg (160 lb) IBW/kg (Calculated) : 70.7  Vital Signs: Temp: 98.6 F (37 C) (12/28 1435) Temp Source: Oral (12/28 1435) BP: 110/74 (12/28 1804) Pulse Rate: 94 (12/28 1804)  Labs: Recent Labs    08/05/21 1440  HGB 10.5*  HCT 33.9*  PLT 283  CREATININE 7.58*     Estimated Creatinine Clearance: 9.6 mL/min (A) (by C-G formula based on SCr of 7.58 mg/dL (H)).  Assessment: 65 y.o. male with a history of PAD, COPD, ESRD on hemodialysis Tuesday Thursday Saturday who comes ED complaining of right foot and leg pain. Pharmacy has been consulted for heparin dosing for PAD with right foot ischemia. Prior to admission he was on apixaban and his last reported dose was last night based on my conversation with the patient.  Goal of Therapy:  aPTT 66 - 102 seconds Monitor platelets by anticoagulation protocol: Yes   Plan:  Give 4500 units bolus x 1 Start heparin infusion at 1450 units/hr (rate based on data from a recent admission where he was administered IV heparin) Check aPTT level in 8 hours and daily while on heparin Continue to monitor H&H and platelets  Dallie Piles, PharmD 08/05/2021,6:28 PM

## 2021-08-05 NOTE — ED Provider Notes (Signed)
°  Emergency Medicine Provider Triage Evaluation Note  Scott Castro , a 65 y.o.male,  was evaluated in triage.  Pt complains of right foot pain and leg swelling.  Patient has a history of peripheral artery disease.  Recently had balloon stents in his leg a few weeks ago.  Since then his right foot has continued to hurt significantly and he has had progressive swelling in his right calf.  Denies fever/chills, chest pain, shortness of breath, abdominal pain   Review of Systems  Positive: Right foot pain, right calf swelling Negative: Denies fever, chest pain, vomiting  Physical Exam   Vitals:   08/05/21 1435  BP: 103/85  Pulse: (!) 108  Resp: 18  Temp: 98.6 F (37 C)  SpO2: 98%   Gen:   Awake, visibly uncomfortable and in pain Resp:  Normal effort  MSK:   Moves extremities without difficulty  Other:  Significant erythema and swelling in the right foot.  Pulses intact.  Limited range of motion  Medical Decision Making  Given the patient's initial medical screening exam, the following diagnostic evaluation has been ordered. The patient will be placed in the appropriate treatment space, once one is available, to complete the evaluation and treatment. I have discussed the plan of care with the patient and I have advised the patient that an ED physician or mid-level practitioner will reevaluate their condition after the test results have been received, as the results may give them additional insight into the type of treatment they may need.    Diagnostics: Labs, right foot x-ray, right lower extremity ultrasound DVT  Treatments: Percocet.   Teodoro Spray, [place] 08/05/21 1438    Vladimir Crofts, MD 08/05/21 [phone]

## 2021-08-05 NOTE — Assessment & Plan Note (Signed)
Stable, monitor.  °

## 2021-08-05 NOTE — H&P (Addendum)
Scott Castro is an 66 y.o. male.   Chief Complaint:  Chief Complaint  Patient presents with   Foot Pain   Leg Swelling    HPI:  Scott Castro is a 65 y.o. malewho presents to the ED for evaluation of right foot and leg pain.History of ESRD on dialysis TThS, COPD on 2-3LNC, atrial fibrillation.Recent medical admission a couple weeks ago at same facility due to critical limb ischemia requiring heparinization and vascular intervention with angioplasty to the right anterior tibial artery, tibial peroneal trunk and popliteal artery.  He is complaint w/ home Plavix and Eliquis Patient presents to the ED from home via POV for evaluation of worsening redness, pain and blisters to his right foot and toes.  He reports initially that the angiogram and balloons helped his foot and lower leg, but over the past couple days his foot has been getting worse with erythema and developing his blisters over his toes. He denies any fever, chills or systemic symptoms beyond the worsening pain, erythema and blisters to his foot. Pain is constant, burning and aching and nonradiating.   ED course:  ED physician spoke with Dr. Lucky Cowboy who will plan for angiogram tomorrow. Heparin gtt started.  X-ray foot showed soft tissue swelling, ultrasound right lower extremity negative for DVT.  Pain treated with oxycodone-acetaminophen 5-3 25 additional oxycodone 5 mg IR.   ASSESSMENT/PLAN:   Ischemic foot ulcer due to atherosclerosis of native artery of limb ([place]) Recent angio intervention, balloon treatment no stents 07/20/21.  Now presenting with cold right foot with new blistering, ED spoke to Dr. Lucky Cowboy who will see patient tomorrow.  Patient has been compliant with home Plavix and Eliquis.  Was transition to heparin while inpatient in anticipation of possible procedure.  No leukocytosis, vital stable, lactate within normal limits, blood cultures pending, ultrasound lower extremity negative for DVT. Heparin gtt.   ESRD (end stage  renal disease) ([place]) Compliant with Tuesday/Thursday/Saturday dialysis.  Nephrology consult placed for inpatient dialysis.  Anemia Stable, monitor  Atrial fibrillation (HCC) Stable, no chest pain or palpitations  GERD (gastroesophageal reflux disease) Continue home medication    VTE Ppx: heparin gtt CODE: FULL      Past Medical History:  Diagnosis Date   Barrett's esophagus    Chronic kidney disease    CHRONIC\   Glomerulosclerosis, focal    Hyperparathyroidism due to renal insufficiency [facility])     Past Surgical History:  Procedure Laterality Date   AV FISTULA PLACEMENT     COLONOSCOPY N/A 03/22/2016   Procedure: COLONOSCOPY;  Surgeon: Manya Silvas, MD;  Location: [place];  Service: Endoscopy;  Laterality: N/A;   DG AV DIALYSIS GRAFT DECLOT OR     ESOPHAGOGASTRODUODENOSCOPY (EGD) WITH PROPOFOL  03/22/2016   Procedure: ESOPHAGOGASTRODUODENOSCOPY (EGD) WITH PROPOFOL;  Surgeon: Manya Silvas, MD;  Location: [facility] ENDOSCOPY;  Service: Endoscopy;;   FLEXIBLE BRONCHOSCOPY     KIDNEY TRANSPLANT Right 1985   LOWER EXTREMITY ANGIOGRAPHY Right 07/20/2021   Procedure: Lower Extremity Angiography;  Surgeon: Algernon Huxley, MD;  Location: [place] [facility];  Service: Cardiovascular;  Laterality: Right;   REMOVAL TENCKHOFF CATH      Family History  Problem Relation Age of Onset   Heart disease Mother    Heart disease Father    Social History:  reports that he has never smoked. He has never used smokeless tobacco. He reports that he does not currently use alcohol. He reports that he does not use drugs.  Allergies: No Known Allergies  (Not in a hospital admission)   Results for orders placed or performed during the hospital encounter of 08/05/21 (from the past 48 hour(s))  Comprehensive metabolic panel     Status: Abnormal   Collection Time: 08/05/21  2:40 PM  Result Value Ref Range   Sodium 138 135 - 145 mmol/L   Potassium 5.1 3.5 - 5.1 mmol/L   Chloride 98  98 - 111 mmol/L   CO2 28 22 - 32 mmol/L   Glucose, Bld 98 70 - 99 mg/dL    Comment: Glucose reference range applies only to samples taken after fasting for at least 8 hours.   BUN 40 (H) 8 - 23 mg/dL   Creatinine, Ser 7.58 (H) 0.61 - 1.24 mg/dL   Calcium 8.4 (L) 8.9 - 10.3 mg/dL   Total Protein 7.6 6.5 - 8.1 g/dL   Albumin 2.8 (L) 3.5 - 5.0 g/dL   AST 14 (L) 15 - 41 U/L   ALT 9 0 - 44 U/L   Alkaline Phosphatase 92 38 - 126 U/L   Total Bilirubin 0.7 0.3 - 1.2 mg/dL   GFR, Estimated 7 (L) >60 mL/min    Comment: (NOTE) Calculated using the CKD-EPI Creatinine Equation (2021)    Anion gap 12 5 - 15    Comment: Performed at [facility], [address] [place], [place] [address]  CBC with Differential     Status: Abnormal   Collection Time: 08/05/21  2:40 PM  Result Value Ref Range   WBC 7.8 4.0 - 10.5 K/uL   RBC 3.29 (L) 4.22 - 5.81 MIL/uL   Hemoglobin 10.5 (L) 13.0 - 17.0 g/dL   HCT 33.9 (L) 39.0 - 52.0 %   MCV 103.0 (H) 80.0 - 100.0 fL   MCH 31.9 26.0 - 34.0 pg   MCHC 31.0 30.0 - 36.0 g/dL   RDW 16.0 (H) 11.5 - 15.5 %   Platelets 283 150 - 400 K/uL   nRBC 0.0 0.0 - 0.2 %   Neutrophils Relative % 74 %   Neutro Abs 5.9 1.7 - 7.7 K/uL   Lymphocytes Relative 12 %   Lymphs Abs 0.9 0.7 - 4.0 K/uL   Monocytes Relative 11 %   Monocytes Absolute 0.9 0.1 - 1.0 K/uL   Eosinophils Relative 1 %   Eosinophils Absolute 0.1 0.0 - 0.5 K/uL   Basophils Relative 1 %   Basophils Absolute 0.1 0.0 - 0.1 K/uL   Immature Granulocytes 1 %   Abs Immature Granulocytes 0.04 0.00 - 0.07 K/uL    Comment: Performed at [facility], [address] [place], [place] [address]  Lactic acid, plasma     Status: None   Collection Time: 08/05/21  2:40 PM  Result Value Ref Range   Lactic Acid, Venous 1.3 0.5 - 1.9 mmol/L    Comment: Performed at [facility], [address] [place], [place] [address]  Resp Panel by RT-PCR (Flu A&B, Covid) Nasopharyngeal Swab     Status: None    Collection Time: 08/05/21  6:36 PM   Specimen: Nasopharyngeal Swab; Nasopharyngeal(NP) swabs in vial transport medium  Result Value Ref Range   SARS Coronavirus 2 by RT PCR NEGATIVE NEGATIVE    Comment: (NOTE) SARS-CoV-2 target nucleic acids are NOT DETECTED.  The SARS-CoV-2 RNA is generally detectable in upper respiratory specimens during the acute phase of infection. The lowest concentration of SARS-CoV-2 viral copies this assay can detect is 138 copies/mL. A negative  result does not preclude SARS-Cov-2 infection and should not be used as the sole basis for treatment or other patient management decisions. A negative result may occur with  improper specimen collection/handling, submission of specimen other than nasopharyngeal swab, presence of viral mutation(s) within the areas targeted by this assay, and inadequate number of viral copies(<138 copies/mL). A negative result must be combined with clinical observations, patient history, and epidemiological information. The expected result is Negative.  Fact Sheet for Patients:  EntrepreneurPulse.com.au  Fact Sheet for Healthcare Providers:  IncredibleEmployment.be  This test is no t yet approved or cleared by the [place] FDA and  has been authorized for detection and/or diagnosis of SARS-CoV-2 by FDA under an Emergency Use Authorization (EUA). This EUA will remain  in effect (meaning this test can be used) for the duration of the COVID-19 declaration under Section 564(b)(1) of the Act, 21 U.S.C.section 360bbb-3(b)(1), unless the authorization is terminated  or revoked sooner.       Influenza A by PCR NEGATIVE NEGATIVE   Influenza B by PCR NEGATIVE NEGATIVE    Comment: (NOTE) The Xpert Xpress SARS-CoV-2/FLU/RSV plus assay is intended as an aid in the diagnosis of influenza from Nasopharyngeal swab specimens and should not be used as a sole basis for treatment. Nasal washings  and aspirates are unacceptable for Xpert Xpress SARS-CoV-2/FLU/RSV testing.  Fact Sheet for Patients: EntrepreneurPulse.com.au  Fact Sheet for Healthcare Providers: IncredibleEmployment.be  This test is not yet approved or cleared by the [place] FDA and has been authorized for detection and/or diagnosis of SARS-CoV-2 by FDA under an Emergency Use Authorization (EUA). This EUA will remain in effect (meaning this test can be used) for the duration of the COVID-19 declaration under Section 564(b)(1) of the Act, 21 U.S.C. section 360bbb-3(b)(1), unless the authorization is terminated or revoked.  Performed at [facility], [address] [place], [place] [address]   APTT     Status: None   Collection Time: 08/05/21  6:46 PM  Result Value Ref Range   aPTT 36 24 - 36 seconds    Comment: Performed at [facility], [address] [place], [place] [address]  Protime-INR     Status: Abnormal   Collection Time: 08/05/21  6:46 PM  Result Value Ref Range   Prothrombin Time 20.3 (H) 11.4 - 15.2 seconds   INR 1.7 (H) 0.8 - 1.2    Comment: (NOTE) INR goal varies based on device and disease states. Performed at [facility], [place], [place] [address]   Heparin level (unfractionated)     Status: Abnormal   Collection Time: 08/05/21  7:09 PM  Result Value Ref Range   Heparin Unfractionated >1.10 (H) 0.30 - 0.70 IU/mL    Comment: (NOTE) The clinical reportable range upper limit is being lowered to >1.10 to align with the FDA approved guidance for the current laboratory assay.  If heparin results are below expected values, and patient dosage has  been confirmed, suggest follow up testing of antithrombin III levels. Performed at [facility], [address] [place], [place] [address]   APTT     Status: None   Collection Time: 08/05/21  7:09 PM  Result Value Ref Range   aPTT 35 24 - 36  seconds    Comment: Performed at [facility], [address] [place], [place] [address]   US Venous Img Lower Right (DVT Study)  Result Date: 08/05/2021 CLINICAL DATA:  Calf swelling. EXAM: RIGHT  LOWER EXTREMITY VENOUS DOPPLER ULTRASOUND TECHNIQUE: Gray-scale sonography with compression, as well as color and duplex ultrasound, were performed to evaluate the deep venous system(s) from the level of the common femoral vein through the popliteal and proximal calf veins. COMPARISON:  RIGHT lower extremity venous duplex, 07/04/2021. RIGHT foot XRs, 08/05/2021. FINDINGS: VENOUS Normal compressibility of the RIGHT common femoral, superficial femoral, and popliteal veins, as well as the visualized calf veins. Visualized portions of profunda femoral vein and great saphenous vein unremarkable. No filling defects to suggest DVT on grayscale or color Doppler imaging. Doppler waveforms show normal direction of venous flow, normal respiratory plasticity and response to augmentation. Limited views of the contralateral common femoral vein are unremarkable. OTHER No evidence of superficial thrombophlebitis or abnormal fluid collection. Subcutaneous edema within the distal RIGHT lower extremity. Limitations: none IMPRESSION: No evidence of femoropopliteal DVT within the RIGHT lower extremity. Michaelle Birks, MD [facility] Electronically Signed   By: Michaelle Birks M.D.   On: 08/05/2021 16:16   DG Foot Complete Right  Result Date: 08/05/2021 CLINICAL DATA:  Infection and pain in a 65 year old male. Discoloration to the tips of the first, second and third toe. EXAM: RIGHT FOOT COMPLETE - 3+ VIEW COMPARISON:  Jan 02, 2021. FINDINGS: Vascular calcifications in the soft tissues. No discrete soft tissue ulceration visible on radiograph No bony changes to suggest osteomyelitis or acute bone finding. Osteopenia. Diffuse soft tissue swelling is however noted over the  dorsum of the foot and plantar aspect of the foot. This is worse in the forefoot. Diffuse soft tissue swelling also in the visualized portions of the LEFT lower extremity. IMPRESSION: 1. No acute bone finding. No discrete soft tissue ulceration visible on radiograph. 2. Diffuse severe soft tissue swelling over the dorsum of the foot and plantar aspect of the foot and extending into the LEFT lower extremity. Findings may represent severe edema and or infection. Electronically Signed   By: Zetta Bills M.D.   On: 08/05/2021 15:13    Review of Systems  Constitutional:  Positive for fatigue. Negative for chills and diaphoresis.  HENT:  Negative for trouble swallowing.   Respiratory:  Negative for apnea, cough, chest tightness and shortness of breath.   Cardiovascular:  Positive for leg swelling. Negative for chest pain.  Gastrointestinal:  Negative for abdominal pain.  Musculoskeletal:        RLE pain as described in HPI  Neurological:  Negative for dizziness and light-headedness.  Psychiatric/Behavioral:  The patient is nervous/anxious.    Blood pressure 110/74, pulse 94, temperature 98.6 F (37 C), temperature source Oral, resp. rate 18, height 5\' 9"  (1.753 m), weight 72.6 kg, SpO2 96 %. Physical Exam Constitutional:      Appearance: Normal appearance.  HENT:     Head: Normocephalic and atraumatic.  Eyes:     Extraocular Movements: Extraocular movements intact.     Conjunctiva/sclera: Conjunctivae normal.  Cardiovascular:     Rate and Rhythm: Normal rate. Rhythm irregular.     Heart sounds: Murmur heard.  Pulmonary:     Effort: Pulmonary effort is normal.     Breath sounds: Normal breath sounds.  Abdominal:     General: Abdomen is flat.     Palpations: Abdomen is soft.  Musculoskeletal:        General: Tenderness present.     Cervical back: Normal range of motion and neck supple.     Right lower leg: Edema present.  Skin:  Findings: Erythema and lesion present.     Comments:  RLE cold distal to ankle   Neurological:     General: No focal deficit present.     Mental Status: He is alert and oriented to person, place, and time.  Psychiatric:        Mood and Affect: Mood normal.        Behavior: Behavior normal.        Thought Content: Thought content normal.        Judgment: Judgment normal.            Emeterio Reeve, DO 08/05/2021, 8:21 PM

## 2021-08-05 NOTE — ED Notes (Signed)
Unsuccessful IV attempt x 2.  Raquel RN to attempt.

## 2021-08-06 ENCOUNTER — Inpatient Hospital Stay: Payer: Medicare Other

## 2021-08-06 ENCOUNTER — Encounter
Admission: EM | Disposition: A | Discharge status: Rehabilitation Facility | Payer: Self-pay | Source: Home / Self Care | Attending: Internal Medicine

## 2021-08-06 DIAGNOSIS — I70261 Atherosclerosis of native arteries of extremities with gangrene, right leg: Principal | ICD-10-CM

## 2021-08-06 HISTORY — PX: LOWER EXTREMITY ANGIOGRAPHY: CATH118251

## 2021-08-06 LAB — CBC
HCT: 29.1 % — ABNORMAL LOW (ref 39.0–52.0)
Hemoglobin: 9.1 g/dL — ABNORMAL LOW (ref 13.0–17.0)
MCH: 31.4 pg (ref 26.0–34.0)
MCHC: 31.3 g/dL (ref 30.0–36.0)
MCV: 100.3 fL — ABNORMAL HIGH (ref 80.0–100.0)
Platelets: 255 10*3/uL (ref 150–400)
RBC: 2.9 MIL/uL — ABNORMAL LOW (ref 4.22–5.81)
RDW: 16 % — ABNORMAL HIGH (ref 11.5–15.5)
WBC: 5.5 10*3/uL (ref 4.0–10.5)
nRBC: 0 % (ref 0.0–0.2)

## 2021-08-06 LAB — CREATININE, SERUM
Creatinine, Ser: 7.69 mg/dL — ABNORMAL HIGH (ref 0.61–1.24)
GFR, Estimated: 7 mL/min — ABNORMAL LOW (ref >60)

## 2021-08-06 LAB — RENAL FUNCTION PANEL
Albumin: 2.7 g/dL — ABNORMAL LOW (ref 3.5–5.0)
Anion gap: 14 (ref 5–15)
BUN: 22 mg/dL (ref 8–23)
CO2: 27 mmol/L (ref 22–32)
Calcium: 8.4 mg/dL — ABNORMAL LOW (ref 8.9–10.3)
Chloride: 98 mmol/L (ref 98–111)
Creatinine, Ser: 5.19 mg/dL — ABNORMAL HIGH (ref 0.61–1.24)
GFR, Estimated: 12 mL/min — ABNORMAL LOW (ref >60)
Glucose, Bld: 69 mg/dL — ABNORMAL LOW (ref 70–99)
Phosphorus: 4.7 mg/dL — ABNORMAL HIGH (ref 2.5–4.6)
Potassium: 4.3 mmol/L (ref 3.5–5.1)
Sodium: 139 mmol/L (ref 135–145)

## 2021-08-06 LAB — DIFFERENTIAL
Abs Immature Granulocytes: 0.03 10*3/uL (ref 0.00–0.07)
Basophils Absolute: 0.1 10*3/uL (ref 0.0–0.1)
Basophils Relative: 1 %
Eosinophils Absolute: 0.1 10*3/uL (ref 0.0–0.5)
Eosinophils Relative: 2 %
Immature Granulocytes: 1 %
Lymphocytes Relative: 21 %
Lymphs Abs: 1.1 10*3/uL (ref 0.7–4.0)
Monocytes Absolute: 0.9 10*3/uL (ref 0.1–1.0)
Monocytes Relative: 16 %
Neutro Abs: 3.3 10*3/uL (ref 1.7–7.7)
Neutrophils Relative %: 59 %

## 2021-08-06 LAB — HEPARIN LEVEL (UNFRACTIONATED): Heparin Unfractionated: >1.1 IU/mL — ABNORMAL HIGH (ref 0.30–0.70)

## 2021-08-06 LAB — APTT: aPTT: 41 seconds — ABNORMAL HIGH (ref 24–36)

## 2021-08-06 LAB — MAGNESIUM: Magnesium: 1.8 mg/dL (ref 1.7–2.4)

## 2021-08-06 LAB — HEPATITIS B SURFACE ANTIBODY,QUALITATIVE: Hep B S Ab: REACTIVE — AB

## 2021-08-06 LAB — HEPATITIS B SURFACE ANTIGEN: Hepatitis B Surface Ag: NONREACTIVE

## 2021-08-06 IMAGING — MR MR FOOT*R* W/O CM
5 series · 40 of 40 positions shown · non-contrast
Comparison: None.

CLINICAL DATA: Right foot and leg pain. History of dialysis. Foot
swelling.

EXAM:
MRI OF THE RIGHT FOREFOOT WITHOUT CONTRAST
TECHNIQUE: Multiplanar, multisequence MR imaging of the right foot was
performed. No intravenous contrast was administered.

[Series 3: T1 · coronal · right · 3.0mm · 0.38mm/px · 12 of 55 slices shown (1 of 2)]
[im 1/55]
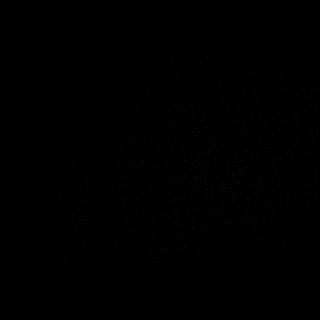
[im 5/55]
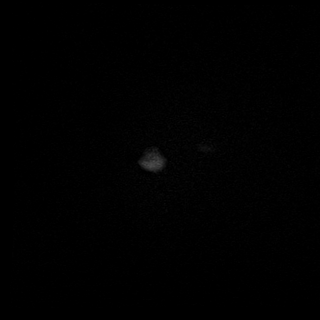
[im 10/55]
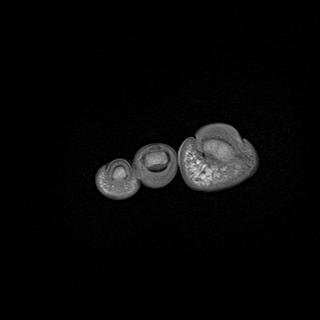
[im 15/55]
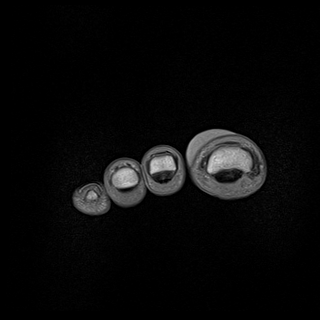
[im 20/55]
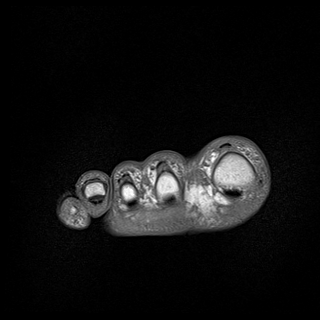
[im 25/55]
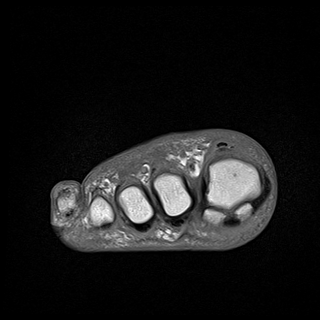
[im 30/55]
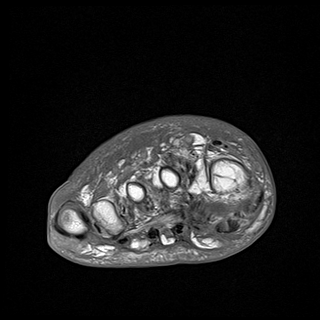
[im 35/55]
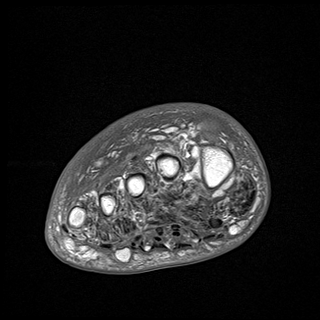
[im 40/55]
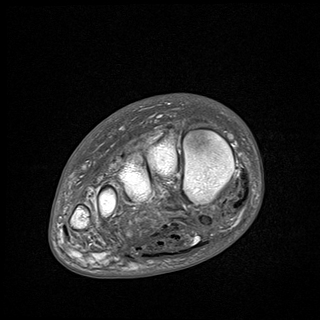
[im 45/55]
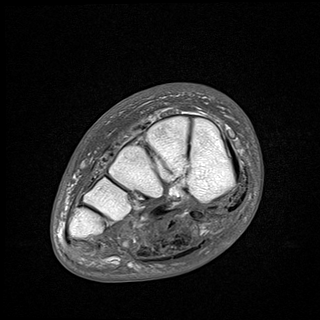
[im 50/55]
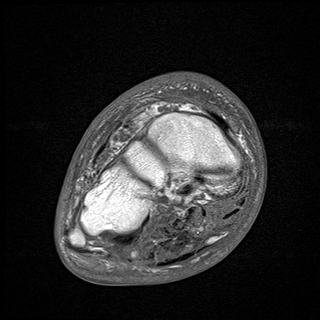
[im 55/55]
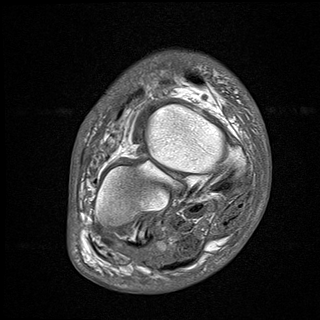

[Series 4: T2 · coronal · right · 3.0mm · 0.50mm/px · 12 of 53 slices shown (1 of 2)]
[im 1/53]
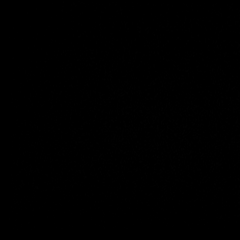
[im 5/53]
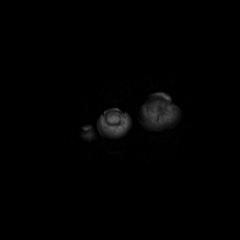
[im 10/53]
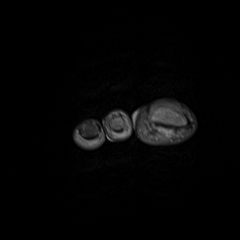
[im 15/53]
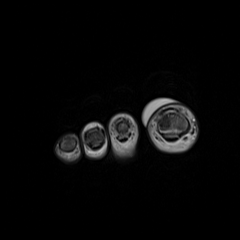
[im 19/53]
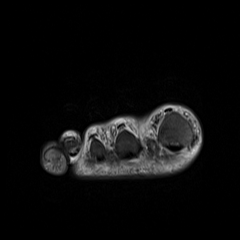
[im 24/53]
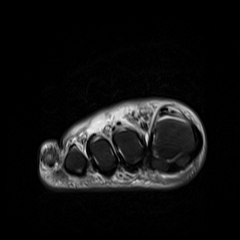
[im 29/53]
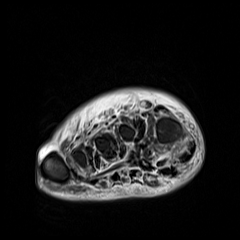
[im 34/53]
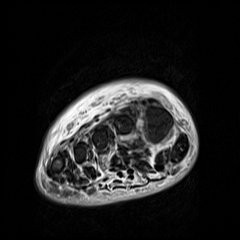
[im 38/53]
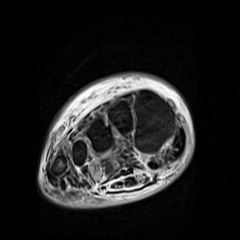
[im 43/53]
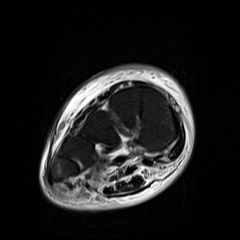
[im 48/53]
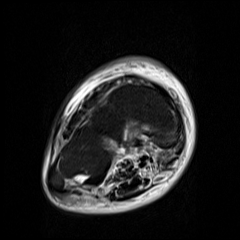
[im 53/53]
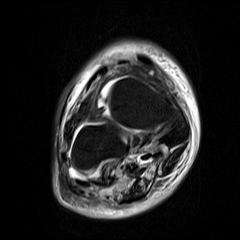

[Series 6: T1 · axial · right · 3.0mm · 0.70mm/px · z∈[-104,-26]mm · 5 of 22 slices shown (2 of 2)]
[im 1/22]
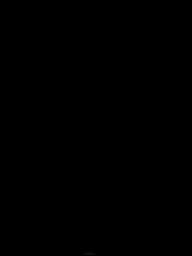
[im 6/22]
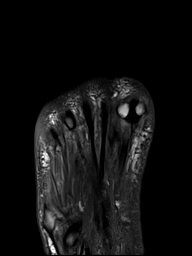
[im 11/22]
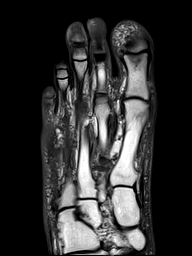
[im 16/22]
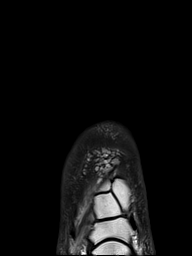
[im 22/22]
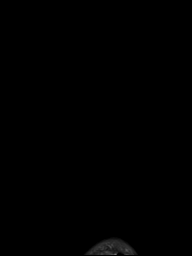

[Series 8: T2 · axial · right · 3.0mm · 0.70mm/px · z∈[-104,-26]mm · 5 of 22 slices shown (2 of 2)]
[im 1/22]
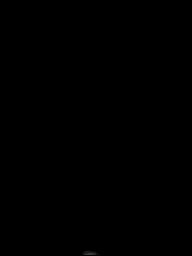
[im 6/22]
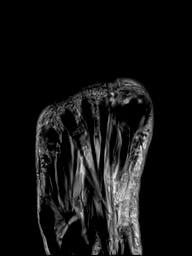
[im 11/22]
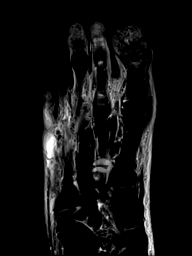
[im 16/22]
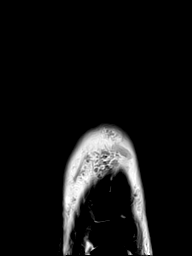
[im 22/22]
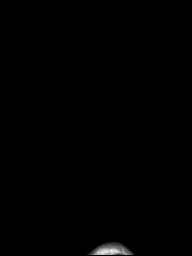

[Series 9: STIR · sagittal · right · 3.0mm · 0.62mm/px · 6 of 29 slices shown]
[im 1/29]
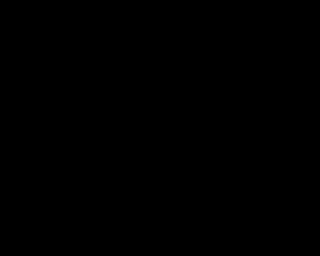
[im 6/29]
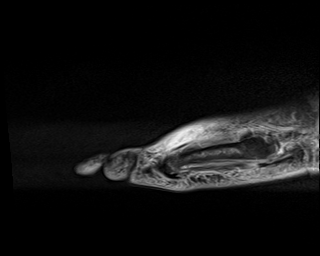
[im 12/29]
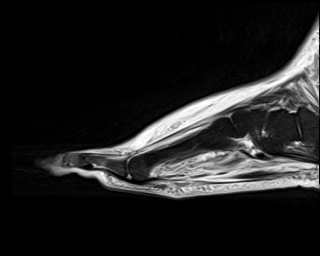
[im 17/29]
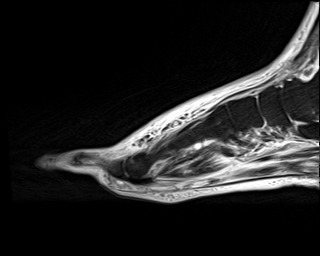
[im 23/29]
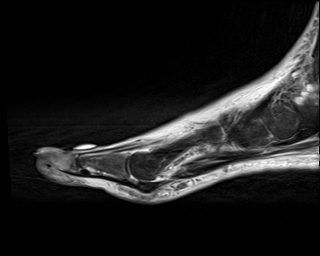
[im 29/29]
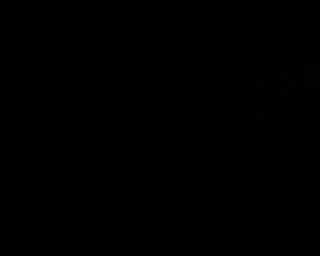

[40 of 40 positions shown; findings below may reference images not displayed]

FINDINGS: Bones/Joint/Cartilage

Soft tissue wound at the tip of the great toe. Bone marrow edema in
the first proximal and distal phalanx, second middle and distal
phalanx, third distal phalanx, fourth middle and distal phalanx and
fifth distal phalanx concerning for osteomyelitis.

Mild marrow edema in the shaft of the fifth metatarsal and fifth
proximal phalanx concerning for stress reaction versus
osteomyelitis. Normal alignment. No joint effusion.

Ligaments

Collateral ligaments are intact.  Lisfranc ligament is intact.

Muscles and Tendons

Flexor, peroneal and extensor compartment tendons are intact.
Muscles are normal.

Soft tissue
No fluid collection or hematoma. No soft tissue mass. Severe soft
tissue edema involving the foot and ankle which may be reactive
versus secondary to osteomyelitis.
IMPRESSION: 1. Soft tissue wound at the tip of the great toe. Bone marrow edema
in the first proximal and distal phalanx, second middle and distal
phalanx, third distal phalanx, fourth middle and distal phalanx and
fifth distal phalanx concerning for osteomyelitis.
2. Mild marrow edema in the shaft of the fifth metatarsal and fifth
proximal phalanx concerning for stress reaction versus
osteomyelitis.
3. Severe soft tissue edema involving the foot and ankle which may
be reactive versus secondary to osteomyelitis.

## 2021-08-06 SURGERY — LOWER EXTREMITY ANGIOGRAPHY
Anesthesia: Moderate Sedation | Laterality: Right

## 2021-08-06 MED ORDER — MIDAZOLAM HCL 2 MG/2ML IJ SOLN
INTRAMUSCULAR | Status: AC
  Filled 2021-08-06: qty 2

## 2021-08-06 MED ORDER — FENTANYL CITRATE (PF) 100 MCG/2ML IJ SOLN
INTRAMUSCULAR | Status: DC | PRN
  Administered 2021-08-06: 50 ug via INTRAVENOUS

## 2021-08-06 MED ORDER — HEPARIN SODIUM (PORCINE) 1000 UNIT/ML IJ SOLN
INTRAMUSCULAR | Status: AC
  Filled 2021-08-06: qty 10

## 2021-08-06 MED ORDER — SODIUM CHLORIDE 0.9 % IV SOLN: INTRAVENOUS | Status: DC

## 2021-08-06 MED ORDER — CEFAZOLIN SODIUM-DEXTROSE 2-4 GM/100ML-% IV SOLN: 2.0000 g | INTRAVENOUS | Status: DC

## 2021-08-06 MED ORDER — FENTANYL CITRATE PF 50 MCG/ML IJ SOSY
PREFILLED_SYRINGE | INTRAMUSCULAR | Status: AC
  Filled 2021-08-06: qty 1

## 2021-08-06 MED ORDER — HEPARIN SODIUM (PORCINE) 1000 UNIT/ML IJ SOLN
INTRAMUSCULAR | Status: DC | PRN
  Administered 2021-08-06: 3000 [IU] via INTRAVENOUS

## 2021-08-06 MED ORDER — IODIXANOL 320 MG/ML IV SOLN
INTRAVENOUS | Status: DC | PRN
  Administered 2021-08-06: 16:00:00 65 mL

## 2021-08-06 MED ORDER — CEFAZOLIN SODIUM-DEXTROSE 2-4 GM/100ML-% IV SOLN
2.0000 g | INTRAVENOUS | Status: AC
  Administered 2021-08-07: 11:00:00 2 g via INTRAVENOUS

## 2021-08-06 MED ORDER — EPOETIN ALFA 4000 UNIT/ML IJ SOLN
4000.0000 [IU] | INTRAMUSCULAR | Status: DC
  Filled 2021-08-06 (×2): qty 1

## 2021-08-06 MED ORDER — MIDAZOLAM HCL 2 MG/2ML IJ SOLN
INTRAMUSCULAR | Status: DC | PRN
  Administered 2021-08-06: 2 mg via INTRAVENOUS

## 2021-08-06 MED ORDER — MUPIROCIN 2 % EX OINT
1.0000 "application " | TOPICAL_OINTMENT | Freq: Two times a day (BID) | CUTANEOUS | Status: AC
  Administered 2021-08-09 – 2021-08-11 (×5): 1 via NASAL
  Filled 2021-08-06: qty 22

## 2021-08-06 MED ORDER — CEFAZOLIN SODIUM-DEXTROSE 1-4 GM/50ML-% IV SOLN
1.0000 g | Freq: Once | INTRAVENOUS | Status: AC
  Administered 2021-08-06: 15:00:00 1 g via INTRAVENOUS

## 2021-08-06 SURGICAL SUPPLY — 21 items
BALLN LUTONIX AV 9X40X75 (BALLOONS) ×2
BALLN ULTRVRSE 3X150X150 (BALLOONS) ×2
BALLOON LUTONIX AV 9X40X75 (BALLOONS) IMPLANT
BALLOON ULTRVRSE 3X150X150 (BALLOONS) IMPLANT
CATH ANGIO 5F PIGTAIL 65CM (CATHETERS) ×1 IMPLANT
CATH NAVICROSS ANGLED 135CM (MICROCATHETER) ×1 IMPLANT
CATH VERT 5X100 (CATHETERS) ×1 IMPLANT
COVER PROBE U/S 5X48 (MISCELLANEOUS) ×1 IMPLANT
DEVICE STARCLOSE SE CLOSURE (Vascular Products) ×1 IMPLANT
GLIDEWIRE ADV .035X260CM (WIRE) ×1 IMPLANT
GUIDEWIRE PFTE-COATED .018X300 (WIRE) ×1 IMPLANT
KIT ENCORE 26 ADVANTAGE (KITS) ×1 IMPLANT
PACK ANGIOGRAPHY (CUSTOM PROCEDURE TRAY) ×2 IMPLANT
SHEATH BRITE TIP 6FR X 23 (SHEATH) ×1 IMPLANT
SHEATH PINNACLE 5F 10CM (SHEATH) ×1 IMPLANT
SHEATH RAABE 6FRX70 (SHEATH) ×1 IMPLANT
STENT LIFESTAR 10X40 (Permanent Stent) ×1 IMPLANT
SYR MEDRAD MARK 7 150ML (SYRINGE) ×1 IMPLANT
TUBING CONTRAST HIGH PRESS 72 (TUBING) ×1 IMPLANT
WIRE G V18X300CM (WIRE) ×1 IMPLANT
WIRE GUIDERIGHT .035X150 (WIRE) ×1 IMPLANT

## 2021-08-06 NOTE — Progress Notes (Signed)
PROGRESS NOTE    Scott Castro  JEH:[id number] DOB: [birth year] DOA: 08/05/2021 PCP: Pcp, No   Brief Narrative: Taken from H&P. Scott Castro is a 65 y.o. gentleman with past medical history significant for ESRD, on HD TTS, COPD on 2 to 3 L of oxygen, atrial fibrillation and peripheral arterial disease came to ED with worsening right foot and leg pain.  Recent admission due to critical limb ischemia s/p vascular intervention with angioplasty of the right anterior tibial artery, tibial peroneal trunk and popliteal artery.  He was discharged on Plavix and Eliquis and is being compliant with that. Patient was also experiencing worsening redness, pain and blisters of right foot and toes for the past couple of days.  He denies any fever or chills or any other systemic symptoms except for worsening pain erythema and blisters. He was hemodynamically stable.  Right lower extremity venous Doppler was negative for DVT.  Vascular surgery was consulted, he will be going for angiography after dialysis today.  Subjective: Patient was seen and examined during dialysis today.  Continues to have right lower extremity pain.  Worsening edema, erythema and blistering.  Pulses intact  Assessment & Plan:   Principal Problem:   Ischemic foot ulcer due to atherosclerosis of native artery of limb ([place]) Active Problems:   ESRD (end stage renal disease) (HCC)   Hypoalbuminemia due to protein-calorie malnutrition ([facility]CC)   Anemia   PAD (peripheral artery disease) (HCC)   GERD (gastroesophageal reflux disease)   Atrial fibrillation ([place])  Ischemic foot ulcer due to atherosclerosis of native artery of limb ([place]) Recent angio intervention, balloon treatment no stents 07/20/21.  Afebrile, no leukocytosis.  Preliminary blood cultures negative, x-ray with extensive soft tissue edema.  On exam there was a marked discoloration of toes, cyanosis versus early gangrene, erythema and edema involving the right foot there is some  blistering of great toe.  Some concern of cellulitis. Going for angiography after dialysis with vascular surgery. He was transition to heparin infusion. -Ordered MRI for to rule out any underlying osteomyelitis. -Continue with pain management  ESRD.  Nephrology was consulted and he will continue his routine dialysis on Tuesday/Thursday and Saturday.  Atrial fibrillation.  Stable -Home Eliquis was switched with heparin infusion. -Continue with home Cardizem  GERD. -Continue home PPI  Objective: Vitals:   08/06/21 1326 08/06/21 1423 08/06/21 1437 08/06/21 1512  BP:   123/86   Pulse: 84  100   Resp: 19  20   Temp:  98.9 F (37.2 C) 98.2 F (36.8 C)   TempSrc:  Oral Oral   SpO2:   96% 96%  Weight:   72.6 kg   Height:   5\' 9"  (1.753 m)     Intake/Output Summary (Last 24 hours) at 08/06/2021 1517 Last data filed at 08/06/2021 1326 Gross per 24 hour  Intake --  Output 1500 ml  Net -1500 ml   Filed Weights   08/05/21 1436 08/06/21 1437  Weight: 72.6 kg 72.6 kg    Examination:      General exam: Appears calm and comfortable  Respiratory system: Clear to auscultation. Respiratory effort normal. Cardiovascular system: S1 & S2 heard, RRR.  Gastrointestinal system: Soft, nontender, nondistended, bowel sounds positive. Central nervous system: Alert and oriented. No focal neurological deficits. Extremities: Right foot with significant edema, erythema and blistering of great toe, blackish discoloration of the first 3 toes. Psychiatry: Judgement and insight appear normal. Mood & affect appropriate.    DVT prophylaxis: Heparin infusion  Code Status: Full Family Communication:  Disposition Plan:  Status is: Inpatient  Remains inpatient appropriate because: Severity of illness   Level of care: Med-Surg  All the records are reviewed and case discussed with Care Management/Social Worker. Management plans discussed with the patient, nursing and they are in  agreement.  Consultants:  Vascular surgery Nephrology  Procedures:  Antimicrobials:   Data Reviewed: I have personally reviewed following labs and imaging studies  CBC: Recent Labs  Lab 08/05/21 1440 08/05/21 2258 08/06/21 1225  WBC 7.8 6.8 5.5  NEUTROABS 5.9  --  3.3  HGB 10.5* 9.1* 9.1*  HCT 33.9* 29.4* 29.1*  MCV 103.0* 102.1* 100.3*  PLT 283 248 564   Basic Metabolic Panel: Recent Labs  Lab 08/05/21 1440 08/05/21 2258  NA 138  --   K 5.1  --   CL 98  --   CO2 28  --   GLUCOSE 98  --   BUN 40*  --   CREATININE 7.58* 7.69*  CALCIUM 8.4*  --    GFR: Estimated Creatinine Clearance: 9.4 mL/min (A) (by C-G formula based on SCr of 7.69 mg/dL (H)). Liver Function Tests: Recent Labs  Lab 08/05/21 1440  AST 14*  ALT 9  ALKPHOS 92  BILITOT 0.7  PROT 7.6  ALBUMIN 2.8*   No results for input(s): LIPASE, AMYLASE in the last 168 hours. No results for input(s): AMMONIA in the last 168 hours. Coagulation Profile: Recent Labs  Lab 08/05/21 1846  INR 1.7*   Cardiac Enzymes: No results for input(s): CKTOTAL, CKMB, CKMBINDEX, TROPONINI in the last 168 hours. BNP (last 3 results) No results for input(s): PROBNP in the last 8760 hours. HbA1C: No results for input(s): HGBA1C in the last 72 hours. CBG: No results for input(s): GLUCAP in the last 168 hours. Lipid Profile: No results for input(s): CHOL, HDL, LDLCALC, TRIG, CHOLHDL, LDLDIRECT in the last 72 hours. Thyroid Function Tests: No results for input(s): TSH, T4TOTAL, FREET4, T3FREE, THYROIDAB in the last 72 hours. Anemia Panel: No results for input(s): VITAMINB12, FOLATE, FERRITIN, TIBC, IRON, RETICCTPCT in the last 72 hours. Sepsis Labs: Recent Labs  Lab 08/05/21 1440  LATICACIDVEN 1.3    Recent Results (from the past 240 hour(s))  Culture, blood (routine x 2)     Status: None (Preliminary result)   Collection Time: 08/05/21  2:40 PM   Specimen: BLOOD  Result Value Ref Range Status   Specimen  Description BLOOD RIGHT ANTECUBITAL  Final   Special Requests   Final    BOTTLES DRAWN AEROBIC AND ANAEROBIC Blood Culture results may not be optimal due to an inadequate volume of blood received in culture bottles   Culture   Final    NO GROWTH < 24 HOURS Performed at [facility], [address], [place], [place] [address]    Report Status PENDING  Incomplete  Culture, blood (routine x 2)     Status: None (Preliminary result)   Collection Time: 08/05/21  3:30 PM   Specimen: BLOOD  Result Value Ref Range Status   Specimen Description BLOOD BLOOD RIGHT HAND  Final   Special Requests   Final    BOTTLES DRAWN AEROBIC AND ANAEROBIC Blood Culture adequate volume   Culture   Final    NO GROWTH < 12 HOURS Performed at [facility], [address] [place], [place] [address]    Report Status PENDING  Incomplete  Resp Panel by RT-PCR (Flu A&B, Covid) Nasopharyngeal Swab  Status: None   Collection Time: 08/05/21  6:36 PM   Specimen: Nasopharyngeal Swab; Nasopharyngeal(NP) swabs in vial transport medium  Result Value Ref Range Status   SARS Coronavirus 2 by RT PCR NEGATIVE NEGATIVE Final    Comment: (NOTE) SARS-CoV-2 target nucleic acids are NOT DETECTED.  The SARS-CoV-2 RNA is generally detectable in upper respiratory specimens during the acute phase of infection. The lowest concentration of SARS-CoV-2 viral copies this assay can detect is 138 copies/mL. A negative result does not preclude SARS-Cov-2 infection and should not be used as the sole basis for treatment or other patient management decisions. A negative result may occur with  improper specimen collection/handling, submission of specimen other than nasopharyngeal swab, presence of viral mutation(s) within the areas targeted by this assay, and inadequate number of viral copies(<138 copies/mL). A negative result must be combined with clinical observations, patient history, and epidemiological information.  The expected result is Negative.  Fact Sheet for Patients:  EntrepreneurPulse.com.au  Fact Sheet for Healthcare Providers:  IncredibleEmployment.be  This test is no t yet approved or cleared by the [place] FDA and  has been authorized for detection and/or diagnosis of SARS-CoV-2 by FDA under an Emergency Use Authorization (EUA). This EUA will remain  in effect (meaning this test can be used) for the duration of the COVID-19 declaration under Section 564(b)(1) of the Act, 21 U.S.C.section 360bbb-3(b)(1), unless the authorization is terminated  or revoked sooner.       Influenza A by PCR NEGATIVE NEGATIVE Final   Influenza B by PCR NEGATIVE NEGATIVE Final    Comment: (NOTE) The Xpert Xpress SARS-CoV-2/FLU/RSV plus assay is intended as an aid in the diagnosis of influenza from Nasopharyngeal swab specimens and should not be used as a sole basis for treatment. Nasal washings and aspirates are unacceptable for Xpert Xpress SARS-CoV-2/FLU/RSV testing.  Fact Sheet for Patients: EntrepreneurPulse.com.au  Fact Sheet for Healthcare Providers: IncredibleEmployment.be  This test is not yet approved or cleared by the [place] FDA and has been authorized for detection and/or diagnosis of SARS-CoV-2 by FDA under an Emergency Use Authorization (EUA). This EUA will remain in effect (meaning this test can be used) for the duration of the COVID-19 declaration under Section 564(b)(1) of the Act, 21 U.S.C. section 360bbb-3(b)(1), unless the authorization is terminated or revoked.  Performed at [facility], [address] [place], [place] [address]      Radiology Studies: US Venous Img Lower Right (DVT Study)  Result Date: 08/05/2021 CLINICAL DATA:  Calf swelling. EXAM: RIGHT LOWER EXTREMITY VENOUS DOPPLER ULTRASOUND TECHNIQUE: Gray-scale sonography with compression, as well as color and duplex  ultrasound, were performed to evaluate the deep venous system(s) from the level of the common femoral vein through the popliteal and proximal calf veins. COMPARISON:  RIGHT lower extremity venous duplex, 07/04/2021. RIGHT foot XRs, 08/05/2021. FINDINGS: VENOUS Normal compressibility of the RIGHT common femoral, superficial femoral, and popliteal veins, as well as the visualized calf veins. Visualized portions of profunda femoral vein and great saphenous vein unremarkable. No filling defects to suggest DVT on grayscale or color Doppler imaging. Doppler waveforms show normal direction of venous flow, normal respiratory plasticity and response to augmentation. Limited views of the contralateral common femoral vein are unremarkable. OTHER No evidence of superficial thrombophlebitis or abnormal fluid collection. Subcutaneous edema within the distal RIGHT lower extremity. Limitations: none IMPRESSION: No evidence of femoropopliteal DVT within the RIGHT lower extremity. Michaelle Birks, MD [facility] Radiology Electronically Signed  By: Michaelle Birks M.D.   On: 08/05/2021 16:16   DG Foot Complete Right  Result Date: 08/05/2021 CLINICAL DATA:  Infection and pain in a 65 year old male. Discoloration to the tips of the first, second and third toe. EXAM: RIGHT FOOT COMPLETE - 3+ VIEW COMPARISON:  Jan 02, 2021. FINDINGS: Vascular calcifications in the soft tissues. No discrete soft tissue ulceration visible on radiograph No bony changes to suggest osteomyelitis or acute bone finding. Osteopenia. Diffuse soft tissue swelling is however noted over the dorsum of the foot and plantar aspect of the foot. This is worse in the forefoot. Diffuse soft tissue swelling also in the visualized portions of the LEFT lower extremity. IMPRESSION: 1. No acute bone finding. No discrete soft tissue ulceration visible on radiograph. 2. Diffuse severe soft tissue swelling over the dorsum of the foot  and plantar aspect of the foot and extending into the LEFT lower extremity. Findings may represent severe edema and or infection. Electronically Signed   By: Zetta Bills M.D.   On: 08/05/2021 15:13    Scheduled Meds:  fentaNYL       heparin sodium (porcine)       midazolam       [MAR Hold] atorvastatin  10 mg Oral QHS   [MAR Hold] cinacalcet  60 mg Oral Q supper   [MAR Hold] diltiazem  180 mg Oral Daily   [MAR Hold] epoetin (EPOGEN/PROCRIT) injection  4,000 Units Intravenous Q T,Th,Sa-HD   [MAR Hold] feeding supplement  237 mL Oral BID BM   [MAR Hold] metoprolol tartrate  100 mg Oral BID   [MAR Hold] pantoprazole  40 mg Oral Daily   [MAR Hold] sevelamer carbonate  2,400 mg Oral TID WC   Continuous Infusions:  sodium chloride      ceFAZolin (ANCEF) IV     heparin Stopped (08/06/21 1435)     LOS: 1 day   Time spent: 40 minutes. More than 50% of the time was spent in counseling/coordination of care  Lorella Nimrod, MD [facility]  If 7PM-7AM, please contact night-coverage [email or web address]  08/06/2021, 3:17 PM   This record has been created using Systems analyst. Errors have been sought and corrected,but may not always be located. Such creation errors do not reflect on the standard of care.

## 2021-08-06 NOTE — Consult Note (Signed)
[facility] Consult Note  MRN : [id number]  Scott Castro is a 65 y.o. (26-Feb-1956) male who presents with chief complaint of  Chief Complaint  Patient presents with   Foot Pain   Leg Swelling  .  History of Present Illness: I am asked to see the patient by Dr. Charlsie Quest for ischemia of the right lower extremity.  The patient has known severe peripheral arterial disease and had an angiogram earlier this month.  At that point he had severe small vessel disease as well as popliteal disease and some tibial disease that was treated with angioplasty.  His toe gangrenous changes on the right foot have progressed and become more painful as well.  His foot is ruborous and somewhat swollen.  It is cool and does not have any palpable pulses.  No fevers or chills or signs of systemic infection.  No chest pain or shortness of breath.  Current Facility-Administered Medications  Medication Dose Route Frequency Provider Last Rate Last Admin   0.9 %  sodium chloride infusion   Intravenous Continuous Algernon Huxley, MD 20 mL/hr at 08/06/21 1445 New Bag at 08/06/21 1445   [MAR Hold] albuterol (PROVENTIL) (2.5 MG/3ML) 0.083% nebulizer solution 3 mL  3 mL Inhalation Q6H PRN Emeterio Reeve, DO       [facility] Hold] atorvastatin (LIPITOR) tablet 10 mg  10 mg Oral QHS Emeterio Reeve, DO   10 mg at 08/05/21 2232   [MAR Hold] cinacalcet (SENSIPAR) tablet 60 mg  60 mg Oral Q supper Emeterio Reeve, DO       [facility] Hold] diltiazem (CARDIZEM CD) 24 hr capsule 180 mg  180 mg Oral Daily Emeterio Reeve, DO   180 mg at 08/05/21 2232   [facility] Hold] epoetin alfa (EPOGEN) injection 4,000 Units  4,000 Units Intravenous Q T,Th,Sa-HD Kolluru, Sarath, MD       [MAR Hold] feeding supplement (ENSURE ENLIVE / ENSURE PLUS) liquid 237 mL  237 mL Oral BID BM Emeterio Reeve, DO       fentaNYL (SUBLIMAZE) 50 MCG/ML injection            fentaNYL (SUBLIMAZE) injection    PRN Algernon Huxley, MD   50 mcg  at 08/06/21 1512   heparin ADULT infusion 100 units/mL (25000 units/255mL)  1,450 Units/hr Intravenous Continuous Dallie Piles, RPH   Stopped at 08/06/21 1435   heparin sodium (porcine) 1000 UNIT/ML injection            heparin sodium (porcine) injection    PRN Algernon Huxley, MD   3,000 Units at 08/06/21 1518   [MAR Hold] HYDROcodone-acetaminophen ([place]) 7.5-325 MG per tablet 1 tablet  1 tablet Oral TID PRN Emeterio Reeve, DO   1 tablet at 08/06/21 0512   iodixanol (VISIPAQUE) 320 MG/ML injection    PRN Algernon Huxley, MD   65 mL at 08/06/21 1545   [MAR Hold] metoprolol tartrate (LOPRESSOR) tablet 100 mg  100 mg Oral BID Emeterio Reeve, DO   100 mg at 08/05/21 2231   midazolam (VERSED) 2 MG/2ML injection            midazolam (VERSED) injection    PRN Algernon Huxley, MD   2 mg at 08/06/21 1512   [MAR Hold] pantoprazole (PROTONIX) EC tablet 40 mg  40 mg Oral Daily Emeterio Reeve, DO   40 mg at 08/05/21 2231   [MAR Hold] sevelamer carbonate (RENVELA) tablet 2,400 mg  2,400 mg Oral  TID WC Emeterio Reeve, DO        Past Medical History:  Diagnosis Date   Barrett's esophagus    Chronic kidney disease    CHRONIC\   Glomerulosclerosis, focal    Hyperparathyroidism due to renal insufficiency [facility])     Past Surgical History:  Procedure Laterality Date   AV FISTULA PLACEMENT     COLONOSCOPY N/A 03/22/2016   Procedure: COLONOSCOPY;  Surgeon: Manya Silvas, MD;  Location: [facility] ENDOSCOPY;  Service: Endoscopy;  Laterality: N/A;   DG AV DIALYSIS GRAFT DECLOT OR     ESOPHAGOGASTRODUODENOSCOPY (EGD) WITH PROPOFOL  03/22/2016   Procedure: ESOPHAGOGASTRODUODENOSCOPY (EGD) WITH PROPOFOL;  Surgeon: Manya Silvas, MD;  Location: [facility] ENDOSCOPY;  Service: Endoscopy;;   FLEXIBLE BRONCHOSCOPY     KIDNEY TRANSPLANT Right 1985   LOWER EXTREMITY ANGIOGRAPHY Right 07/20/2021   Procedure: Lower Extremity Angiography;  Surgeon: Algernon Huxley, MD;  Location: [place] CV LAB;  Service:  Cardiovascular;  Laterality: Right;   REMOVAL TENCKHOFF CATH       Social History   Tobacco Use   Smoking status: Never   Smokeless tobacco: Never  Substance Use Topics   Alcohol use: Not Currently    Comment: very little   Drug use: No     Family History  Problem Relation Age of Onset   Heart disease Mother    Heart disease Father   No bleeding or clotting disorders  No Known Allergies   REVIEW OF SYSTEMS (Negative unless checked)  Constitutional: [] Weight loss  [] Fever  [] Chills Cardiac: [] Chest pain   [] Chest pressure   [] Palpitations   [] Shortness of breath when laying flat   [] Shortness of breath at rest   [] Shortness of breath with exertion. Vascular:  [] Pain in legs with walking   [] Pain in legs at rest   [] Pain in legs when laying flat   [] Claudication   [] Pain in feet when walking  [] Pain in feet at rest  [] Pain in feet when laying flat   [] History of DVT   [] Phlebitis   [] Swelling in legs   [] Varicose veins   [x] Non-healing ulcers Pulmonary:   [] Uses home oxygen   [] Productive cough   [] Hemoptysis   [] Wheeze  [] COPD   [] Asthma Neurologic:  [] Dizziness  [] Blackouts   [] Seizures   [] History of stroke   [] History of TIA  [] Aphasia   [] Temporary blindness   [] Dysphagia   [] Weakness or numbness in arms   [] Weakness or numbness in legs Musculoskeletal:  [x] Arthritis   [] Joint swelling   [] Joint pain   [] Low back pain Hematologic:  [] Easy bruising  [] Easy bleeding   [] Hypercoagulable state   [x] Anemic  [] Hepatitis Gastrointestinal:  [] Blood in stool   [] Vomiting blood  [x] Gastroesophageal reflux/heartburn   [] Difficulty swallowing. Genitourinary:  [x] Chronic kidney disease   [] Difficult urination  [] Frequent urination  [] Burning with urination   [] Blood in urine Skin:  [] Rashes   [x] Ulcers   [x] Wounds Psychological:  [] History of anxiety   []  History of major depression.  Physical Examination  Vitals:   08/06/21 1423 08/06/21 1437 08/06/21 1512 08/06/21 1530  BP:   123/86    Pulse:  100    Resp:  20    Temp: 98.9 F (37.2 C) 98.2 F (36.8 C)    TempSrc: Oral Oral    SpO2:  96% 96% 98%  Weight:  72.6 kg    Height:  5\' 9"  (1.753 m)     Body mass index is 23.64 kg/m.  Gen:  WD/WN, NAD. Appears older than stated age. Head: [place]/AT, No temporalis wasting.  Ear/Nose/Throat: Hearing grossly intact, nares w/o erythema or drainage, oropharynx w/o Erythema/Exudate Eyes: Sclera non-icteric, conjunctiva clear Neck: Trachea midline.  No JVD.  Pulmonary:  Good air movement, respirations not labored, equal bilaterally.  Cardiac: RRR, normal S1, S2. Vascular:  Vessel Right Left  Radial Palpable Palpable                          PT Not Palpable Not Palpable  DP Not Palpable Not Palpable    Musculoskeletal: M/S 5/5 throughout.  Right foot is ischemic appearing with rubor and sluggish capillary refill.  Gangrenous changes to multiple toes.  The foot is cool.  1+ right lower extremity edema Neurologic: Sensation grossly intact in extremities.  Symmetrical.  Speech is fluent. Motor exam as listed above. Psychiatric: Judgment intact, Mood & affect appropriate for pt's clinical situation. Dermatologic: Multiple toes on the right foot with clearly gangrenous changes     CBC Lab Results  Component Value Date   WBC 5.5 08/06/2021   HGB 9.1 (L) 08/06/2021   HCT 29.1 (L) 08/06/2021   MCV 100.3 (H) 08/06/2021   PLT 255 08/06/2021    BMET    Component Value Date/Time   NA 138 08/05/2021 1440   NA 142 06/10/2012 0924   K 5.1 08/05/2021 1440   K 4.5 06/16/2013 1315   CL 98 08/05/2021 1440   CL 102 06/10/2012 0924   CO2 28 08/05/2021 1440   CO2 29 06/10/2012 0924   GLUCOSE 98 08/05/2021 1440   GLUCOSE 83 06/10/2012 0924   BUN 40 (H) 08/05/2021 1440   BUN 35 (H) 06/10/2012 0924   CREATININE 7.69 (H) 08/05/2021 2258   CREATININE 10.29 (H) 06/10/2012 0924   CALCIUM 8.4 (L) 08/05/2021 1440   CALCIUM 9.2 06/10/2012 0924   GFRNONAA 7 (L) 08/05/2021  2258   GFRNONAA 5 (L) 06/10/2012 0924   GFRAA 9 (L) 09/18/2019 0430   GFRAA 6 (L) 06/10/2012 0924   Estimated Creatinine Clearance: 9.4 mL/min (A) (by C-G formula based on SCr of 7.69 mg/dL (H)).  COAG Lab Results  Component Value Date   INR 1.7 (H) 08/05/2021   INR 1.1 07/18/2021   INR 1.2 07/03/2021    Radiology CT CHEST WO CONTRAST  Result Date: 07/29/2021 CLINICAL DATA:  History of cough in a 65 year old male. EXAM: CT CHEST WITHOUT CONTRAST TECHNIQUE: Multidetector CT imaging of the chest was performed following the standard protocol without IV contrast. COMPARISON:  November 26, 2020 and chest x-ray from November twenty-fifth 2022. FINDINGS: Cardiovascular: Calcified atheromatous plaque of the thoracic aorta. No signs of aneurysmal dilation. Marked cardiac enlargement is increased from previous imaging. Small to moderate pericardial effusion. Three-vessel coronary artery calcification. Mitral annular calcification. Central pulmonary vasculature is engorged to 3.2 cm. Mediastinum/Nodes: Mild to moderately enlarged lymph nodes throughout the chest largest is a RIGHT paratracheal node measuring 16 mm (image 58/2) scattered lymph nodes elsewhere in the chest without signs of pathologic enlargement. Top-normal subcarinal lymph node at 14 mm. No hilar adenopathy. No axillary adenopathy. Small lymph nodes track into the neck. Lungs/Pleura: Signs of rounded atelectasis at the RIGHT lung base associated with chronic small RIGHT-sided pleural effusion also with mild chronic non nodular appearing pleural thickening. Pleural fluid is mildly loculated and mildly increased from previous imaging in the RIGHT chest. Loculated fluid in the major fissure in the LEFT chest measuring 10.8 x  5.2 cm. This is contiguous with small chronic appearing LEFT-sided pleural effusion with small to moderate sub pulmonic component as well. Pleural fluid is also loculated along the medial LEFT chest. Developing rounded  atelectasis is present at the LEFT lung base. Micro nodular pattern with calcification in the anterior LEFT chest is compatible with sequela of prior infection and is unchanged. Bandlike subpleural scarring along the peripheral RIGHT chest has developed since more remote imaging from 2020. No signs of focal ground-glass or lobar level consolidation. Upper Abdomen: Extensive calcification of visceral branches in the upper abdomen. No acute upper abdominal process. Small volume ascites. Musculoskeletal: Renal osteodystrophy and spinal degenerative changes. No destructive or acute bone process. IMPRESSION: Increase in loculated pleural fluid particularly in the LEFT chest since more remote imaging. This is associated with small to moderate pericardial effusion and may be related to underlying volume overload or heart failure and is associated with rounded atelectasis at the lung bases. Loculated appearance more likely related to chronicity but can be seen in the setting of chronic effusion or empyema, no definitive signs infection currently such as associated gas locules. Bandlike subpleural scarring along the peripheral RIGHT chest has developed since more remote imaging from 2020 perhaps related to recent infection. Increase in size and lymph nodes in the chest largest at 16 mm. These may be reactive or related to underlying cardiac dysfunction. Suggest 3 month follow-up to assess for any changes. Marked cardiac enlargement and coronary artery disease. Cardiac enlargement is increased compared to previous imaging. Small volume ascites further supporting the possibility of volume overload or heart failure. Renal osteodystrophy and spinal degenerative changes. Aortic atherosclerosis. Aortic Atherosclerosis (ICD10-I70.0). Electronically Signed   By: Zetta Bills M.D.   On: 07/29/2021 17:36   PERIPHERAL VASCULAR CATHETERIZATION  Result Date: 08/06/2021 See surgical note for result.  PERIPHERAL VASCULAR  CATHETERIZATION  Result Date: 07/20/2021 See surgical note for result.  US Venous Img Lower Right (DVT Study)  Result Date: 08/05/2021 CLINICAL DATA:  Calf swelling. EXAM: RIGHT LOWER EXTREMITY VENOUS DOPPLER ULTRASOUND TECHNIQUE: Gray-scale sonography with compression, as well as color and duplex ultrasound, were performed to evaluate the deep venous system(s) from the level of the common femoral vein through the popliteal and proximal calf veins. COMPARISON:  RIGHT lower extremity venous duplex, 07/04/2021. RIGHT foot XRs, 08/05/2021. FINDINGS: VENOUS Normal compressibility of the RIGHT common femoral, superficial femoral, and popliteal veins, as well as the visualized calf veins. Visualized portions of profunda femoral vein and great saphenous vein unremarkable. No filling defects to suggest DVT on grayscale or color Doppler imaging. Doppler waveforms show normal direction of venous flow, normal respiratory plasticity and response to augmentation. Limited views of the contralateral common femoral vein are unremarkable. OTHER No evidence of superficial thrombophlebitis or abnormal fluid collection. Subcutaneous edema within the distal RIGHT lower extremity. Limitations: none IMPRESSION: No evidence of femoropopliteal DVT within the RIGHT lower extremity. Michaelle Birks, MD [facility]iology Electronically Signed   By: Michaelle Birks M.D.   On: 08/05/2021 16:16   US ARTERIAL ABI (SCREENING LOWER EXTREMITY)  Result Date: 07/07/2021 CLINICAL DATA:  right lower extremity rest pain, skin color changes. Diabetes, hypertension. EXAM: NONINVASIVE PHYSIOLOGIC VASCULAR STUDY OF BILATERAL LOWER EXTREMITIES TECHNIQUE: Evaluation of both lower extremities were performed at rest, including calculation of ankle-brachial indices with single level Doppler, pressure and pulse volume recording. COMPARISON:  None. FINDINGS: Right ABI:  1.38 Left ABI:  Non calculable due to  vascular noncompressibility Right Lower  Extremity: Monophasic distal right posterior tibial arterial waveform. Markedly attenuated dorsalis pedis waveform. Left Lower Extremity: Attenuated distal posterior tibial and dorsalis pedis waveforms. IMPRESSION: Abnormal distal bilateral lower extremity arterial waveforms consistent with arterial occlusive disease of indeterminate severity. ABIs are considered nondiagnostic secondary to vascular noncompressibility presumably related to medial calcification. If further evaluation is needed, consider segmental studies and/or MRA runoff. Electronically Signed   By: Lucrezia Europe M.D.   On: 07/07/2021 16:46   DG Foot Complete Right  Result Date: 08/05/2021 CLINICAL DATA:  Infection and pain in a 65 year old male. Discoloration to the tips of the first, second and third toe. EXAM: RIGHT FOOT COMPLETE - 3+ VIEW COMPARISON:  Jan 02, 2021. FINDINGS: Vascular calcifications in the soft tissues. No discrete soft tissue ulceration visible on radiograph No bony changes to suggest osteomyelitis or acute bone finding. Osteopenia. Diffuse soft tissue swelling is however noted over the dorsum of the foot and plantar aspect of the foot. This is worse in the forefoot. Diffuse soft tissue swelling also in the visualized portions of the LEFT lower extremity. IMPRESSION: 1. No acute bone finding. No discrete soft tissue ulceration visible on radiograph. 2. Diffuse severe soft tissue swelling over the dorsum of the foot and plantar aspect of the foot and extending into the LEFT lower extremity. Findings may represent severe edema and or infection. Electronically Signed   By: Zetta Bills M.D.   On: 08/05/2021 15:13   VAS [name] ABI WITH/WO TBI  Result Date: 07/17/2021  LOWER EXTREMITY DOPPLER STUDY Patient Name:  Scott Castro  Date of Exam:   07/14/2021 Medical Rec #: [id number]     Accession #:    [id number] Date of Birth: 1955/11/29     Patient Gender: M Patient Age:   61 years Exam Location:   [facility] Procedure:      VAS [name] ABI WITH/WO TBI Referring Phys: Leotis Pain --------------------------------------------------------------------------------  Indications: Rest pain, and Rt Great Toe Pain.  Performing Technologist: Almira Coaster RVS  Examination Guidelines: A complete evaluation includes at minimum, Doppler waveform signals and systolic blood pressure reading at the level of bilateral brachial, anterior tibial, and posterior tibial arteries, when vessel segments are accessible. Bilateral testing is considered an integral part of a complete examination. Photoelectric Plethysmograph (PPG) waveforms and toe systolic pressure readings are included as required and additional duplex testing as needed. Limited examinations for reoccurring indications may be performed as noted.  ABI Findings: +---------+------------------+-----+----------+--------+  Right     Rt Pressure (mmHg) Index Waveform   Comment   +---------+------------------+-----+----------+--------+  Brachial  153                                           +---------+------------------+-----+----------+--------+  ATA       61                       monophasic .40       +---------+------------------+-----+----------+--------+  PTA       83                 0.54  monophasic           +---------+------------------+-----+----------+--------+  Great Toe 0                  0.00  Absent               +---------+------------------+-----+----------+--------+ +---------+------------------+-----+--------+-------+  Left      Lt Pressure (mmHg) Index Waveform Comment  +---------+------------------+-----+--------+-------+  ATA       250                      biphasic [place]       +---------+------------------+-----+--------+-------+  PTA       120                0.78  biphasic          +---------+------------------+-----+--------+-------+  Great Toe 65                 0.42  Abnormal          +---------+------------------+-----+--------+-------+  +-------+-----------+-----------+------------+------------+  ABI/TBI Today's ABI Today's TBI Previous ABI Previous TBI  +-------+-----------+-----------+------------+------------+  Right   .54         0                                      +-------+-----------+-----------+------------+------------+  Left    >1.0 [place]     .42                                    +-------+-----------+-----------+------------+------------+  Summary: Right: Resting right ankle-brachial index indicates moderate right lower extremity arterial disease. The right toe-brachial index is abnormal. Left: Resting left ankle-brachial index indicates noncompressible left lower extremity arteries. The left toe-brachial index is abnormal.  *See table(s) above for measurements and observations.  Electronically signed by Leotis Pain MD on 07/17/2021 at 9:03:08 AM.    Final       Assessment/Plan 1.  PAD with gangrene of the toes.  Patient's toes have worsened despite attempts at revascularization earlier this month.  We will perform a repeat angiogram today for a last ditch effort at limb salvage although I think a major amputation is the most likely outcome at this point given his extreme small vessel disease seen at the last angiogram. 2.  End-stage renal disease.  Clearly affects his vascular status and also worsens wound healing potential.  Had dialysis earlier today had his access is working well    Leotis Pain, MD  08/06/2021 3:49 PM    This note was created with Dragon medical transcription system.  Any error is purely unintentional

## 2021-08-06 NOTE — ED Notes (Signed)
Pt transported to dialysis by this RN

## 2021-08-06 NOTE — Op Note (Signed)
[facility]  Percutaneous Study/Intervention Procedural Note   Date of Surgery: 08/06/2021  Surgeon(s):Ambyr Qadri    Assistants:none  Pre-operative Diagnosis: PAD with gangrene right foot  Post-operative diagnosis:  Same  Procedure(s) Performed:             1.  Ultrasound guidance for vascular access left femoral artery             2.  Catheter placement into right anterior tibial and right posterior tibial artery from left femoral approach             3.  Aortogram and selective right lower extremity angiogram             4.  Percutaneous transluminal angioplasty of right tibioperoneal trunk and proximal posterior tibial artery with 3 mm diameter by 15 cm length angioplasty balloon             5.  Life star stent placement to the right external iliac artery with 10 mm diameter by 4 cm length stent  6.  StarClose closure device left femoral artery  EBL: 5 cc  Contrast: 65 cc  Fluoro Time: 6.2 minutes  Moderate Conscious Sedation Time: approximately 31 minutes using 2 mg of Versed and 50 mcg of Fentanyl              Indications:  Patient is a 65 y.o.male with known severe peripheral arterial disease who had intervention several weeks ago.  His gangrenous changes to his toes have continued to progress and his foot has a clearly ischemic appearance. The patient is brought in for angiography for further evaluation and potential treatment.  Due to the limb threatening nature of the situation, angiogram was performed for attempted limb salvage. The patient is aware that if the procedure fails, amputation would be expected.  The patient also understands that even with successful revascularization, amputation may still be required due to the severity of the situation.  Risks and benefits are discussed and informed consent is obtained.   Procedure:  The patient was identified and appropriate procedural time out was performed.  The patient was then placed supine on the  table and prepped and draped in the usual sterile fashion. Moderate conscious sedation was administered during a face to face encounter with the patient throughout the procedure with my supervision of the RN administering medicines and monitoring the patient's vital signs, pulse oximetry, telemetry and mental status throughout from the start of the procedure until the patient was taken to the recovery room. Ultrasound was used to evaluate the left common femoral artery.  It was patent .  A digital ultrasound image was acquired.  A Seldinger needle was used to access the left common femoral artery under direct ultrasound guidance and a permanent image was performed.  A 0.035 J wire was advanced without resistance and a 5Fr sheath was placed.  Pigtail catheter was placed into the aorta and an AP aortogram was performed. This demonstrated essentially no renal artery flow.  The aorta was widely patent.  The left common and external iliac artery widely patent.  The right common iliac artery was patent although there was a focal calcific stenosis seen in the mid to distal right external iliac artery of 70 to 80%.  This was not well seen on his previous angiogram likely due to bowel gas. I then crossed the aortic bifurcation and advanced to the right femoral head. Selective right lower extremity angiogram was then performed. This demonstrated fairly normal  common femoral artery, profunda femoris artery, and no hemodynamically significant stenosis in the SFA or popliteal arteries.  The previous area of popliteal stenosis that was treated was patent.  There was severe tibial disease in all 3 vessels.  The anterior tibial artery occluded in the mid to distal anterior tibial artery.  The previous areas treated down to that point actually remain patent, but there is still no flow into the foot.  The tibioperoneal trunk had about a 60 to 70% stenosis.  The peroneal artery was occluded in the proximal segment and did not have  flow distally.  The posterior tibial artery occluded in the mid to distal segment.  The only flow to the foot was through collateral vessels. It was felt that it was in the patient's best interest to proceed with intervention after these images to avoid a second procedure and a larger amount of contrast and fluoroscopy based off of the findings from the initial angiogram. The patient was systemically heparinized and a 6 French 70 cm sheath was then placed over the Terumo Advantage wire. I then used a Kumpe catheter and the advantage wire to the proximal anterior tibial artery.  A selective image through a Nava cross catheter in the anterior tibial artery showed the areas that were previously treated were patent but in the mid to distal anterior tibial artery there was an occlusion with essentially no flow to the foot.  I then redirected down into the tibioperoneal trunk and the posterior tibial artery.  I elected to go ahead and perform angioplasty to try to improve the collateral flow distally although with limited flow to the foot, this was likely still a limb loss situation.  A 0.018 advantage wire was used to navigate through the tibioperoneal trunk stenosis in the proximal high-grade stenosis of the posterior tibial artery we treated these with a 3 mm diameter by 15 cm length angioplasty balloon inflated to 8 atm for 1 minute.  Completion imaging should only about a 20 to 30% residual stenosis in the areas treated although the vessel did still occluded in the mid to distal segment.  At this point, we exchanged for a shorter 6 French sheath and addressed the right external iliac artery stenosis.  We saw this in the LAO projection and it was likely due to fairly extensive bowel gas this was not seen well at the last angiogram.  We addressed this with a 10 mm diameter by 4 cm length life star stent postdilated with a 9 mm diameter Lutonix drug-coated angioplasty balloon with excellent angiographic completion  result and less than 10% residual stenosis in the right external iliac artery. I elected to terminate the procedure. The sheath was removed and StarClose closure device was deployed in the left femoral artery with excellent hemostatic result. The patient was taken to the recovery room in stable condition having tolerated the procedure well.  Findings:               Aortogram:  This demonstrated essentially no renal artery flow.  The aorta was widely patent.  The left common and external iliac artery widely patent.  The right common iliac artery was patent although there was a focal calcific stenosis seen in the mid to distal right external iliac artery of 70 to 80%.  This was not well seen on his previous angiogram likely due to bowel gas             Right lower Extremity: This demonstrated fairly normal common  femoral artery, profunda femoris artery, and no hemodynamically significant stenosis in the SFA or popliteal arteries.  The previous area of popliteal stenosis that was treated was patent.  There was severe tibial disease in all 3 vessels.  The anterior tibial artery occluded in the mid to distal anterior tibial artery.  The previous areas treated down to that point actually remain patent, but there is still no flow into the foot.  The tibioperoneal trunk had about a 60 to 70% stenosis.  The peroneal artery was occluded in the proximal segment and did not have flow distally.  The posterior tibial artery occluded in the mid to distal segment.  The only flow to the foot was through collateral vessels   Disposition: Patient was taken to the recovery room in stable condition having tolerated the procedure well.  Complications: None  Scott Castro 08/06/2021 3:48 PM   This note was created with Dragon Medical transcription system. Any errors in dictation are purely unintentional.

## 2021-08-06 NOTE — Progress Notes (Addendum)
[facility]  ROUNDING NOTE   Subjective:   Scott Castro is a 65 year old male with a past medical history of Barrett's esophagus, hyperparathyroidism, and end-stage renal disease on dialysis.  Patient reports to the emergency department with complaints of leg pain and swelling.  The patient has been admitted for Limb ischemia [I99.8] Ischemic foot ulcer due to atherosclerosis of native artery of limb ([place]) [I70.25, L97.509]  Patient is known to our practice through previous hospitalizations.  Patient receives outpatient dialysis treatments at [facility], supervised by [facility] physicians.  Last dialysis treatment received on Tuesday.  Patient was recently admitted with the same complaints and received angioplasty at that time.  Patient states pain and redness has worsened.  Also reports blistering on the right foot and toes.  He states angiogram initially improved pain and circulation, but has gotten worse over the past 2 to 3 days.  Denies fever or chills.  Denies recent new injury to the limb.  Right foot x-ray shows severe soft tissue swelling of left lower extremity.  Venous Doppler negative for DVT  We have been consulted to manage dialysis needs during this admission  Patient seen and evaluated during dialysis   HEMODIALYSIS FLOWSHEET:  Blood Flow Rate (mL/min): 400 mL/min Arterial Pressure (mmHg): -130 mmHg Venous Pressure (mmHg): 200 mmHg Transmembrane Pressure (mmHg): 50 mmHg Ultrafiltration Rate (mL/min): 990 mL/min Dialysate Flow Rate (mL/min): 500 ml/min Conductivity: Machine : 13.9 Conductivity: Machine : 13.9    Objective:  Vital signs in last 24 hours:  Temp:  [98.5 F (36.9 C)-98.6 F (37 C)] 98.5 F (36.9 C) (12/29 1026) Pulse Rate:  [38-108] 59 (12/29 1315) Resp:  [14-34] 15 (12/29 1315) BP: (92-153)/(61-92) 138/87 (12/29 1315) SpO2:  [90 %-100 %] 100 % (12/29 0911) Weight:  [72.6 kg] 72.6 kg (12/28 1436)  Weight change:  Filed Weights    08/05/21 1436  Weight: 72.6 kg    Intake/Output: No intake/output data recorded.   Intake/Output this shift:  No intake/output data recorded.  Physical Exam: General: NAD, resting on stretcher  Head: Normocephalic, atraumatic. Moist oral mucosal membranes  Eyes: Anicteric  Neck: Supple, trachea midline  Lungs:  Clear to auscultation, normal effort  Heart: Irregular rate and rhythm  Abdomen:  Soft, nontender  Extremities: No peripheral edema.  Neurologic: Nonfocal, moving all four extremities  Skin: Right great and second toe erythema  Access: LUE aVG    Basic Metabolic Panel: Recent Labs  Lab 08/05/21 1440 08/05/21 2258  NA 138  --   K 5.1  --   CL 98  --   CO2 28  --   GLUCOSE 98  --   BUN 40*  --   CREATININE 7.58* 7.69*  CALCIUM 8.4*  --     Liver Function Tests: Recent Labs  Lab 08/05/21 1440  AST 14*  ALT 9  ALKPHOS 92  BILITOT 0.7  PROT 7.6  ALBUMIN 2.8*   No results for input(s): LIPASE, AMYLASE in the last 168 hours. No results for input(s): AMMONIA in the last 168 hours.  CBC: Recent Labs  Lab 08/05/21 1440 08/05/21 2258 08/06/21 1225  WBC 7.8 6.8 5.5  NEUTROABS 5.9  --  3.3  HGB 10.5* 9.1* 9.1*  HCT 33.9* 29.4* 29.1*  MCV 103.0* 102.1* 100.3*  PLT 283 248 255    Cardiac Enzymes: No results for input(s): CKTOTAL, CKMB, CKMBINDEX, TROPONINI in the last 168 hours.  BNP: Invalid input(s): POCBNP  CBG: No results for input(s): GLUCAP in  the last 168 hours.  Microbiology: Results for orders placed or performed during the hospital encounter of 08/05/21  Culture, blood (routine x 2)     Status: None (Preliminary result)   Collection Time: 08/05/21  2:40 PM   Specimen: BLOOD  Result Value Ref Range Status   Specimen Description BLOOD RIGHT ANTECUBITAL  Final   Special Requests   Final    BOTTLES DRAWN AEROBIC AND ANAEROBIC Blood Culture results may not be optimal due to an inadequate volume of blood received in culture bottles    Culture   Final    NO GROWTH < 24 HOURS Performed at [facility], [address] [place], [place] [address]    Report Status PENDING  Incomplete  Culture, blood (routine x 2)     Status: None (Preliminary result)   Collection Time: 08/05/21  3:30 PM   Specimen: BLOOD  Result Value Ref Range Status   Specimen Description BLOOD BLOOD RIGHT HAND  Final   Special Requests   Final    BOTTLES DRAWN AEROBIC AND ANAEROBIC Blood Culture adequate volume   Culture   Final    NO GROWTH < 12 HOURS Performed at [facility], [address], [place], [place] [address]    Report Status PENDING  Incomplete  Resp Panel by RT-PCR (Flu A&B, Covid) Nasopharyngeal Swab     Status: None   Collection Time: 08/05/21  6:36 PM   Specimen: Nasopharyngeal Swab; Nasopharyngeal(NP) swabs in vial transport medium  Result Value Ref Range Status   SARS Coronavirus 2 by RT PCR NEGATIVE NEGATIVE Final    Comment: (NOTE) SARS-CoV-2 target nucleic acids are NOT DETECTED.  The SARS-CoV-2 RNA is generally detectable in upper respiratory specimens during the acute phase of infection. The lowest concentration of SARS-CoV-2 viral copies this assay can detect is 138 copies/mL. A negative result does not preclude SARS-Cov-2 infection and should not be used as the sole basis for treatment or other patient management decisions. A negative result may occur with  improper specimen collection/handling, submission of specimen other than nasopharyngeal swab, presence of viral mutation(s) within the areas targeted by this assay, and inadequate number of viral copies(<138 copies/mL). A negative result must be combined with clinical observations, patient history, and epidemiological information. The expected result is Negative.  Fact Sheet for Patients:  EntrepreneurPulse.com.au  Fact Sheet for Healthcare Providers:  IncredibleEmployment.be  This test is no t yet  approved or cleared by the [place] FDA and  has been authorized for detection and/or diagnosis of SARS-CoV-2 by FDA under an Emergency Use Authorization (EUA). This EUA will remain  in effect (meaning this test can be used) for the duration of the COVID-19 declaration under Section 564(b)(1) of the Act, 21 U.S.C.section 360bbb-3(b)(1), unless the authorization is terminated  or revoked sooner.       Influenza A by PCR NEGATIVE NEGATIVE Final   Influenza B by PCR NEGATIVE NEGATIVE Final    Comment: (NOTE) The Xpert Xpress SARS-CoV-2/FLU/RSV plus assay is intended as an aid in the diagnosis of influenza from Nasopharyngeal swab specimens and should not be used as a sole basis for treatment. Nasal washings and aspirates are unacceptable for Xpert Xpress SARS-CoV-2/FLU/RSV testing.  Fact Sheet for Patients: EntrepreneurPulse.com.au  Fact Sheet for Healthcare Providers: IncredibleEmployment.be  This test is not yet approved or cleared by the [place] FDA and has been authorized for detection and/or diagnosis of SARS-CoV-2 by FDA under an Emergency Use Authorization (EUA). This EUA will  remain in effect (meaning this test can be used) for the duration of the COVID-19 declaration under Section 564(b)(1) of the Act, 21 U.S.C. section 360bbb-3(b)(1), unless the authorization is terminated or revoked.  Performed at [facility], [address] [place], [place] [address]     Coagulation Studies: Recent Labs    08/05/21 1846  LABPROT 20.3*  INR 1.7*    Urinalysis: No results for input(s): COLORURINE, LABSPEC, PHURINE, GLUCOSEU, HGBUR, BILIRUBINUR, KETONESUR, PROTEINUR, UROBILINOGEN, NITRITE, LEUKOCYTESUR in the last 72 hours.  Invalid input(s): APPERANCEUR    Imaging: US Venous Img Lower Right (DVT Study)  Result Date: 08/05/2021 CLINICAL DATA:  Calf swelling. EXAM: RIGHT LOWER EXTREMITY VENOUS DOPPLER ULTRASOUND  TECHNIQUE: Gray-scale sonography with compression, as well as color and duplex ultrasound, were performed to evaluate the deep venous system(s) from the level of the common femoral vein through the popliteal and proximal calf veins. COMPARISON:  RIGHT lower extremity venous duplex, 07/04/2021. RIGHT foot XRs, 08/05/2021. FINDINGS: VENOUS Normal compressibility of the RIGHT common femoral, superficial femoral, and popliteal veins, as well as the visualized calf veins. Visualized portions of profunda femoral vein and great saphenous vein unremarkable. No filling defects to suggest DVT on grayscale or color Doppler imaging. Doppler waveforms show normal direction of venous flow, normal respiratory plasticity and response to augmentation. Limited views of the contralateral common femoral vein are unremarkable. OTHER No evidence of superficial thrombophlebitis or abnormal fluid collection. Subcutaneous edema within the distal RIGHT lower extremity. Limitations: none IMPRESSION: No evidence of femoropopliteal DVT within the RIGHT lower extremity. Michaelle Birks, MD [facility] Radiology Electronically Signed   By: Michaelle Birks M.D.   On: 08/05/2021 16:16   DG Foot Complete Right  Result Date: 08/05/2021 CLINICAL DATA:  Infection and pain in a 65 year old male. Discoloration to the tips of the first, second and third toe. EXAM: RIGHT FOOT COMPLETE - 3+ VIEW COMPARISON:  Jan 02, 2021. FINDINGS: Vascular calcifications in the soft tissues. No discrete soft tissue ulceration visible on radiograph No bony changes to suggest osteomyelitis or acute bone finding. Osteopenia. Diffuse soft tissue swelling is however noted over the dorsum of the foot and plantar aspect of the foot. This is worse in the forefoot. Diffuse soft tissue swelling also in the visualized portions of the LEFT lower extremity. IMPRESSION: 1. No acute bone finding. No discrete soft tissue ulceration visible on  radiograph. 2. Diffuse severe soft tissue swelling over the dorsum of the foot and plantar aspect of the foot and extending into the LEFT lower extremity. Findings may represent severe edema and or infection. Electronically Signed   By: Zetta Bills M.D.   On: 08/05/2021 15:13     Medications:    sodium chloride      ceFAZolin (ANCEF) IV     heparin 1,450 Units/hr (08/05/21 2250)    atorvastatin  10 mg Oral QHS   cinacalcet  60 mg Oral Q supper   diltiazem  180 mg Oral Daily   epoetin (EPOGEN/PROCRIT) injection  4,000 Units Intravenous Q T,Th,Sa-HD   feeding supplement  237 mL Oral BID BM   metoprolol tartrate  100 mg Oral BID   pantoprazole  40 mg Oral Daily   sevelamer carbonate  2,400 mg Oral TID WC   albuterol, HYDROcodone-acetaminophen  Assessment/ Plan:  Mr. ITAI BARBIAN is a 65 y.o.  male with a past medical history of Barrett's esophagus, hyperparathyroidism, and end-stage renal disease on dialysis.  Patient reports to  the emergency department with complaints of leg pain and swelling.  The patient has been admitted for Limb ischemia [I99.8] Ischemic foot ulcer due to atherosclerosis of native artery of limb ([place]) [I70.25, L97.509]  [facility]/TTS/LUE AVG/71kg  End-stage renal disease on dialysis.  Will maintain outpatient schedule during this admission, if possible.  Dialysis performed today to maintain outpatient schedule.  UF goal achieved of 1.5 L.  Next treatment scheduled for Saturday  2. Anemia of chronic kidney disease  Lab Results  Component Value Date   HGB 9.1 (L) 08/06/2021    Hemoglobin within acceptable range  3. Secondary Hyperparathyroidism: with outpatient labs:phosphorus 5.3, calcium 6.8 on 07/16/21.   Lab Results  Component Value Date   PTH 93 (H) 04/09/2019   CALCIUM 8.4 (L) 08/05/2021   PHOS 7.1 (H) 07/04/2021  Mircera prescribed biweekly outpatient.  Last dose 07/29/2021.  Calcitriol also prescribed outpatient Continue sevelamer  with meals  4.  Ischemic foot ulcer.  Vascular consulted and angiogram planned for today.    LOS: 1   12/29/20221:48 PM

## 2021-08-06 NOTE — Progress Notes (Signed)
Dr. Lucky Cowboy at bedside, speaking with pt. Re: procedural results.

## 2021-08-06 NOTE — Progress Notes (Signed)
Patient admitted from Cath Lab due to Right Foot Pain and Leg Swelling. BKA scheduled for 08/07/2021. A+Ox4. VSS, patient on 2 L of O2 via  (baseline). Patient tearful and expressing need to speak to a psychologist. Bed locked and to lowest position, call bell and needs within reach. Will continue to monitor and assess with plan of care.

## 2021-08-06 NOTE — Plan of Care (Signed)

## 2021-08-06 NOTE — ED Notes (Signed)
RN talked to dialysis nurse about pt coming back to ER or to specials for procedure. Per dialysis RN, pt is being taken to specials for procedure at this time.

## 2021-08-06 NOTE — ED Notes (Signed)
Lab unable to obtain blood samples.

## 2021-08-07 ENCOUNTER — Ambulatory Visit: Payer: Medicare Other | Admitting: Podiatry

## 2021-08-07 ENCOUNTER — Encounter
Admission: EM | Disposition: A | Discharge status: Rehabilitation Facility | Payer: Self-pay | Source: Home / Self Care | Attending: Internal Medicine

## 2021-08-07 ENCOUNTER — Encounter: Payer: Self-pay | Admitting: Vascular Surgery

## 2021-08-07 ENCOUNTER — Inpatient Hospital Stay: Payer: Medicare Other | Admitting: Anesthesiology

## 2021-08-07 DIAGNOSIS — I70261 Atherosclerosis of native arteries of extremities with gangrene, right leg: Secondary | ICD-10-CM

## 2021-08-07 HISTORY — PX: AMPUTATION: SHX166

## 2021-08-07 LAB — SURGICAL PCR SCREEN
MRSA, PCR: NEGATIVE
Staphylococcus aureus: NEGATIVE

## 2021-08-07 LAB — GLUCOSE, CAPILLARY: Glucose-Capillary: 117 mg/dL — ABNORMAL HIGH (ref 70–99)

## 2021-08-07 LAB — HEPATITIS B SURFACE ANTIBODY, QUANTITATIVE: Hep B S AB Quant (Post): 138.8 m[IU]/mL (ref >9.9)

## 2021-08-07 SURGERY — AMPUTATION BELOW KNEE
Anesthesia: General | Site: Knee | Laterality: Right

## 2021-08-07 MED ORDER — ONDANSETRON HCL 4 MG/2ML IJ SOLN: 4.0000 mg | Freq: Once | INTRAMUSCULAR | Status: DC | PRN

## 2021-08-07 MED ORDER — PROPOFOL 10 MG/ML IV BOLUS
INTRAVENOUS | Status: DC | PRN
  Administered 2021-08-07: 80 mg via INTRAVENOUS

## 2021-08-07 MED ORDER — CEFAZOLIN SODIUM-DEXTROSE 2-4 GM/100ML-% IV SOLN
INTRAVENOUS | Status: AC
  Filled 2021-08-07: qty 100

## 2021-08-07 MED ORDER — FENTANYL CITRATE (PF) 100 MCG/2ML IJ SOLN
INTRAMUSCULAR | Status: DC | PRN
  Administered 2021-08-07: 25 ug via INTRAVENOUS
  Administered 2021-08-07: 50 ug via INTRAVENOUS
  Administered 2021-08-07: 25 ug via INTRAVENOUS

## 2021-08-07 MED ORDER — OXYCODONE HCL 5 MG/5ML PO SOLN: 5.0000 mg | Freq: Once | ORAL | Status: AC | PRN

## 2021-08-07 MED ORDER — ACETAMINOPHEN 10 MG/ML IV SOLN: 1000.0000 mg | Freq: Once | INTRAVENOUS | Status: DC | PRN

## 2021-08-07 MED ORDER — LIDOCAINE HCL (CARDIAC) PF 100 MG/5ML IV SOSY
PREFILLED_SYRINGE | INTRAVENOUS | Status: DC | PRN
  Administered 2021-08-07: 60 mg via INTRAVENOUS

## 2021-08-07 MED ORDER — PHENYLEPHRINE HCL-NACL 20-0.9 MG/250ML-% IV SOLN
INTRAVENOUS | Status: DC | PRN
  Administered 2021-08-07: 50 ug/min via INTRAVENOUS

## 2021-08-07 MED ORDER — 0.9 % SODIUM CHLORIDE (POUR BTL) OPTIME
TOPICAL | Status: DC | PRN
  Administered 2021-08-07: 11:00:00 300 mL

## 2021-08-07 MED ORDER — METHOCARBAMOL 500 MG PO TABS
500.0000 mg | ORAL_TABLET | Freq: Once | ORAL | Status: AC
  Administered 2021-08-07: 23:00:00 500 mg via ORAL
  Filled 2021-08-07: qty 1

## 2021-08-07 MED ORDER — MIDAZOLAM HCL 2 MG/2ML IJ SOLN
INTRAMUSCULAR | Status: AC
  Filled 2021-08-07: qty 2

## 2021-08-07 MED ORDER — MIDAZOLAM HCL 2 MG/2ML IJ SOLN
INTRAMUSCULAR | Status: DC | PRN
  Administered 2021-08-07: 1 mg via INTRAVENOUS

## 2021-08-07 MED ORDER — OXYCODONE HCL 5 MG PO TABS
ORAL_TABLET | ORAL | Status: AC
  Filled 2021-08-07: qty 1

## 2021-08-07 MED ORDER — OXYCODONE HCL 5 MG PO TABS
5.0000 mg | ORAL_TABLET | Freq: Once | ORAL | Status: AC | PRN
  Administered 2021-08-07: 13:00:00 5 mg via ORAL

## 2021-08-07 MED ORDER — PROPOFOL 10 MG/ML IV BOLUS
INTRAVENOUS | Status: AC
  Filled 2021-08-07: qty 20

## 2021-08-07 MED ORDER — LACTATED RINGERS IV SOLN: INTRAVENOUS | Status: DC

## 2021-08-07 MED ORDER — VASOPRESSIN 20 UNIT/ML IV SOLN
INTRAVENOUS | Status: DC | PRN
  Administered 2021-08-07 (×2): 1 [IU] via INTRAVENOUS

## 2021-08-07 MED ORDER — FENTANYL CITRATE (PF) 100 MCG/2ML IJ SOLN
INTRAMUSCULAR | Status: AC
  Filled 2021-08-07: qty 2

## 2021-08-07 MED ORDER — FENTANYL CITRATE (PF) 100 MCG/2ML IJ SOLN
25.0000 ug | INTRAMUSCULAR | Status: DC | PRN
  Administered 2021-08-07: 13:00:00 50 ug via INTRAVENOUS
  Administered 2021-08-07: 12:00:00 25 ug via INTRAVENOUS
  Administered 2021-08-07: 13:00:00 50 ug via INTRAVENOUS

## 2021-08-07 MED ORDER — FENTANYL CITRATE (PF) 100 MCG/2ML IJ SOLN
INTRAMUSCULAR | Status: AC
  Administered 2021-08-07: 12:00:00 25 ug via INTRAVENOUS
  Filled 2021-08-07: qty 2

## 2021-08-07 MED ORDER — SODIUM CHLORIDE 0.9 % IV SOLN: INTRAVENOUS | Status: DC | PRN

## 2021-08-07 MED ORDER — HYDROMORPHONE HCL 1 MG/ML IJ SOLN
0.5000 mg | INTRAMUSCULAR | Status: DC | PRN
  Administered 2021-08-07 – 2021-08-08 (×4): 0.5 mg via INTRAVENOUS
  Filled 2021-08-07 (×4): qty 1

## 2021-08-07 MED ORDER — PHENYLEPHRINE HCL (PRESSORS) 10 MG/ML IV SOLN
INTRAVENOUS | Status: DC | PRN
  Administered 2021-08-07 (×4): 100 ug via INTRAVENOUS

## 2021-08-07 MED ORDER — ONDANSETRON HCL 4 MG/2ML IJ SOLN
INTRAMUSCULAR | Status: DC | PRN
  Administered 2021-08-07: 4 mg via INTRAVENOUS

## 2021-08-07 MED ORDER — APIXABAN 5 MG PO TABS
5.0000 mg | ORAL_TABLET | Freq: Two times a day (BID) | ORAL | Status: DC
  Administered 2021-08-08 – 2021-08-12 (×8): 5 mg via ORAL
  Filled 2021-08-07 (×8): qty 1

## 2021-08-07 SURGICAL SUPPLY — 41 items
APL PRP STRL LF DISP 70% ISPRP (MISCELLANEOUS) ×1
BLADE SAGITTAL WIDE XTHICK NO (BLADE) IMPLANT
BLADE SAW SAG 25.4X90 (BLADE) ×2 IMPLANT
BNDG CMPR STD VLCR NS LF 5.8X6 (GAUZE/BANDAGES/DRESSINGS)
BNDG COHESIVE 4X5 TAN ST LF (GAUZE/BANDAGES/DRESSINGS) ×2 IMPLANT
BNDG ELASTIC 6X5.8 VLCR NS LF (GAUZE/BANDAGES/DRESSINGS) ×1 IMPLANT
BNDG GAUZE ELAST 4 BULKY (GAUZE/BANDAGES/DRESSINGS) ×4 IMPLANT
BRUSH SCRUB EZ  4% CHG (MISCELLANEOUS) ×1
BRUSH SCRUB EZ 4% CHG (MISCELLANEOUS) ×1 IMPLANT
CHLORAPREP W/TINT 26 (MISCELLANEOUS) ×2 IMPLANT
DRAPE INCISE IOBAN 66X45 STRL (DRAPES) ×1 IMPLANT
ELECT CAUTERY BLADE 6.4 (BLADE) ×2 IMPLANT
ELECT REM PT RETURN 9FT ADLT (ELECTROSURGICAL) ×2
ELECTRODE REM PT RTRN 9FT ADLT (ELECTROSURGICAL) ×1 IMPLANT
GAUZE 4X4 16PLY ~~LOC~~+RFID DBL (SPONGE) ×1 IMPLANT
GAUZE XEROFORM 1X8 LF (GAUZE/BANDAGES/DRESSINGS) ×3 IMPLANT
GLOVE SURG SYN 7.0 (GLOVE) ×2 IMPLANT
GLOVE SURG SYN 7.0 PF PI (GLOVE) ×1 IMPLANT
GOWN STRL REUS W/ TWL LRG LVL3 (GOWN DISPOSABLE) ×1 IMPLANT
GOWN STRL REUS W/ TWL XL LVL3 (GOWN DISPOSABLE) ×1 IMPLANT
GOWN STRL REUS W/TWL LRG LVL3 (GOWN DISPOSABLE)
GOWN STRL REUS W/TWL XL LVL3 (GOWN DISPOSABLE) ×2
HANDLE YANKAUER SUCT BULB TIP (MISCELLANEOUS) ×2 IMPLANT
KIT TURNOVER KIT A (KITS) ×2 IMPLANT
LABEL OR SOLS (LABEL) ×2 IMPLANT
MANIFOLD NEPTUNE II (INSTRUMENTS) ×2 IMPLANT
NS IRRIG 500ML POUR BTL (IV SOLUTION) ×1 IMPLANT
PACK EXTREMITY ARMC (MISCELLANEOUS) ×2 IMPLANT
PAD ABD DERMACEA PRESS 5X9 (GAUZE/BANDAGES/DRESSINGS) ×4 IMPLANT
PAD PREP 24X41 OB/GYN DISP (PERSONAL CARE ITEMS) ×2 IMPLANT
SPONGE T-LAP 18X18 ~~LOC~~+RFID (SPONGE) ×3 IMPLANT
STAPLER SKIN PROX 35W (STAPLE) ×2 IMPLANT
STOCKINETTE M/LG 89821 (MISCELLANEOUS) ×2 IMPLANT
SUT SILK 2 0 (SUTURE) ×2
SUT SILK 2 0 SH (SUTURE) ×4 IMPLANT
SUT SILK 2-0 18XBRD TIE 12 (SUTURE) ×1 IMPLANT
SUT SILK 3 0 (SUTURE) ×2
SUT SILK 3-0 18XBRD TIE 12 (SUTURE) ×1 IMPLANT
SUT VIC AB 0 CT1 36 (SUTURE) ×4 IMPLANT
SUT VIC AB 2-0 CT1 (SUTURE) ×4 IMPLANT
WATER STERILE IRR 500ML POUR (IV SOLUTION) ×1 IMPLANT

## 2021-08-07 NOTE — Care Management Important Message (Signed)
Important Message  Patient Details  Name: DEMETRIA LIGHTSEY MRN: [id number] Date of Birth: 07/26/1956   Medicare Important Message Given:  Yes     Juliann Pulse A Janye Maynor 08/07/2021, 1:40 PM

## 2021-08-07 NOTE — Progress Notes (Signed)
[facility]  ROUNDING NOTE   Subjective:   Scott Castro is a 65 year old male with a past medical history of Barrett's esophagus, hyperparathyroidism, and end-stage renal disease on dialysis.  Patient reports to the emergency department with complaints of leg pain and swelling.  The patient has been admitted for Limb ischemia [I99.8] Ischemic foot ulcer due to atherosclerosis of native artery of limb ([place]) [I70.25, L97.509]  Patient is known to our practice through previous hospitalizations.  Patient receives outpatient dialysis treatments at [facility], supervised by [facility] physicians.    Patient seen briefly  Currently NPO for surgical procedure [place], withdrawn affect   Objective:  Vital signs in last 24 hours:  Temp:  [97.1 F (36.2 C)-99.3 F (37.4 C)] 98.1 F (36.7 C) (12/30 1332) Pulse Rate:  [64-120] 64 (12/30 1332) Resp:  [11-21] 18 (12/30 1332) BP: (93-157)/(59-99) 131/86 (12/30 1332) SpO2:  [82 %-99 %] 98 % (12/30 1332) Weight:  [72.6 kg] 72.6 kg (12/30 1006)  Weight change: 0.024 kg Filed Weights   08/05/21 1436 08/06/21 1437 08/07/21 1006  Weight: 72.6 kg 72.6 kg 72.6 kg    Intake/Output: I/O last 3 completed shifts: In: 3 [IV Piggyback:50] Out: 1500 [Other:1500]   Intake/Output this shift:  Total I/O In: 100 [I.V.:100] Out: 50 [Blood:50]  Physical Exam: General: NAD, resting on stretcher  Head: Normocephalic, atraumatic. Moist oral mucosal membranes  Eyes: Anicteric  Neck: Supple, trachea midline  Lungs:  Clear to auscultation, normal effort  Heart: Irregular rate and rhythm  Abdomen:  Soft, nontender  Extremities: No peripheral edema.RLE cool to touch  Neurologic: Nonfocal, moving all four extremities  Skin: Right great and second toe erythema  Access: LUE aVG    Basic Metabolic Panel: Recent Labs  Lab 08/05/21 1440 08/05/21 2258 08/06/21 1725  NA 138  --  139  K 5.1  --  4.3  CL 98  --  98  CO2 28  --  27  GLUCOSE 98   --  69*  BUN 40*  --  22  CREATININE 7.58* 7.69* 5.19*  CALCIUM 8.4*  --  8.4*  MG  --   --  1.8  PHOS  --   --  4.7*     Liver Function Tests: Recent Labs  Lab 08/05/21 1440 08/06/21 1725  AST 14*  --   ALT 9  --   ALKPHOS 92  --   BILITOT 0.7  --   PROT 7.6  --   ALBUMIN 2.8* 2.7*    No results for input(s): LIPASE, AMYLASE in the last 168 hours. No results for input(s): AMMONIA in the last 168 hours.  CBC: Recent Labs  Lab 08/05/21 1440 08/05/21 2258 08/06/21 1225  WBC 7.8 6.8 5.5  NEUTROABS 5.9  --  3.3  HGB 10.5* 9.1* 9.1*  HCT 33.9* 29.4* 29.1*  MCV 103.0* 102.1* 100.3*  PLT 283 248 255     Cardiac Enzymes: No results for input(s): CKTOTAL, CKMB, CKMBINDEX, TROPONINI in the last 168 hours.  BNP: Invalid input(s): POCBNP  CBG: No results for input(s): GLUCAP in the last 168 hours.  Microbiology: Results for orders placed or performed during the hospital encounter of 08/05/21  Culture, blood (routine x 2)     Status: None (Preliminary result)   Collection Time: 08/05/21  2:40 PM   Specimen: BLOOD  Result Value Ref Range Status   Specimen Description BLOOD RIGHT ANTECUBITAL  Final   Special Requests   Final  BOTTLES DRAWN AEROBIC AND ANAEROBIC Blood Culture results may not be optimal due to an inadequate volume of blood received in culture bottles   Culture   Final    NO GROWTH 2 DAYS Performed at [facility], [address] [place], [place] [address]    Report Status PENDING  Incomplete  Culture, blood (routine x 2)     Status: None (Preliminary result)   Collection Time: 08/05/21  3:30 PM   Specimen: BLOOD  Result Value Ref Range Status   Specimen Description BLOOD BLOOD RIGHT HAND  Final   Special Requests   Final    BOTTLES DRAWN AEROBIC AND ANAEROBIC Blood Culture adequate volume   Culture   Final    NO GROWTH 2 DAYS Performed at [facility], [address], [place], [place] [address]    Report Status PENDING   Incomplete  Resp Panel by RT-PCR (Flu A&B, Covid) Nasopharyngeal Swab     Status: None   Collection Time: 08/05/21  6:36 PM   Specimen: Nasopharyngeal Swab; Nasopharyngeal(NP) swabs in vial transport medium  Result Value Ref Range Status   SARS Coronavirus 2 by RT PCR NEGATIVE NEGATIVE Final    Comment: (NOTE) SARS-CoV-2 target nucleic acids are NOT DETECTED.  The SARS-CoV-2 RNA is generally detectable in upper respiratory specimens during the acute phase of infection. The lowest concentration of SARS-CoV-2 viral copies this assay can detect is 138 copies/mL. A negative result does not preclude SARS-Cov-2 infection and should not be used as the sole basis for treatment or other patient management decisions. A negative result may occur with  improper specimen collection/handling, submission of specimen other than nasopharyngeal swab, presence of viral mutation(s) within the areas targeted by this assay, and inadequate number of viral copies(<138 copies/mL). A negative result must be combined with clinical observations, patient history, and epidemiological information. The expected result is Negative.  Fact Sheet for Patients:  EntrepreneurPulse.com.au  Fact Sheet for Healthcare Providers:  IncredibleEmployment.be  This test is no t yet approved or cleared by the [place] FDA and  has been authorized for detection and/or diagnosis of SARS-CoV-2 by FDA under an Emergency Use Authorization (EUA). This EUA will remain  in effect (meaning this test can be used) for the duration of the COVID-19 declaration under Section 564(b)(1) of the Act, 21 U.S.C.section 360bbb-3(b)(1), unless the authorization is terminated  or revoked sooner.       Influenza A by PCR NEGATIVE NEGATIVE Final   Influenza B by PCR NEGATIVE NEGATIVE Final    Comment: (NOTE) The Xpert Xpress SARS-CoV-2/FLU/RSV plus assay is intended as an aid in the diagnosis of influenza  from Nasopharyngeal swab specimens and should not be used as a sole basis for treatment. Nasal washings and aspirates are unacceptable for Xpert Xpress SARS-CoV-2/FLU/RSV testing.  Fact Sheet for Patients: EntrepreneurPulse.com.au  Fact Sheet for Healthcare Providers: IncredibleEmployment.be  This test is not yet approved or cleared by the [place] FDA and has been authorized for detection and/or diagnosis of SARS-CoV-2 by FDA under an Emergency Use Authorization (EUA). This EUA will remain in effect (meaning this test can be used) for the duration of the COVID-19 declaration under Section 564(b)(1) of the Act, 21 U.S.C. section 360bbb-3(b)(1), unless the authorization is terminated or revoked.  Performed at [facility], [address] [place], [place] [address]   Surgical PCR screen     Status: None   Collection Time: 08/06/21 11:23 PM   Specimen: Nasal Mucosa; Nasal Swab  Result Value Ref Range Status   MRSA, PCR NEGATIVE NEGATIVE Final   Staphylococcus aureus NEGATIVE NEGATIVE Final    Comment: (NOTE) The Xpert SA Assay (FDA approved for NASAL specimens in patients 36 years of age and older), is one component of a comprehensive surveillance program. It is not intended to diagnose infection nor to guide or monitor treatment. Performed at [facility], [address] [place], [place] [address]     Coagulation Studies: Recent Labs    08/05/21 1846  LABPROT 20.3*  INR 1.7*     Urinalysis: No results for input(s): COLORURINE, LABSPEC, PHURINE, GLUCOSEU, HGBUR, BILIRUBINUR, KETONESUR, PROTEINUR, UROBILINOGEN, NITRITE, LEUKOCYTESUR in the last 72 hours.  Invalid input(s): APPERANCEUR    Imaging: MR FOOT RIGHT WO CONTRAST  Result Date: 08/07/2021 CLINICAL DATA:  Right foot and leg pain. History of dialysis. Foot swelling. EXAM: MRI OF THE RIGHT FOREFOOT WITHOUT CONTRAST TECHNIQUE: Multiplanar,  multisequence MR imaging of the right foot was performed. No intravenous contrast was administered. COMPARISON:  None. FINDINGS: Bones/Joint/Cartilage Soft tissue wound at the tip of the great toe. Bone marrow edema in the first proximal and distal phalanx, second middle and distal phalanx, third distal phalanx, fourth middle and distal phalanx and fifth distal phalanx concerning for osteomyelitis. Mild marrow edema in the shaft of the fifth metatarsal and fifth proximal phalanx concerning for stress reaction versus osteomyelitis. Normal alignment. No joint effusion. Ligaments Collateral ligaments are intact.  Lisfranc ligament is intact. Muscles and Tendons Flexor, peroneal and extensor compartment tendons are intact. Muscles are normal. Soft tissue No fluid collection or hematoma. No soft tissue mass. Severe soft tissue edema involving the foot and ankle which may be reactive versus secondary to osteomyelitis. IMPRESSION: 1. Soft tissue wound at the tip of the great toe. Bone marrow edema in the first proximal and distal phalanx, second middle and distal phalanx, third distal phalanx, fourth middle and distal phalanx and fifth distal phalanx concerning for osteomyelitis. 2. Mild marrow edema in the shaft of the fifth metatarsal and fifth proximal phalanx concerning for stress reaction versus osteomyelitis. 3. Severe soft tissue edema involving the foot and ankle which may be reactive versus secondary to osteomyelitis. Electronically Signed   By: Kathreen Devoid M.D.   On: 08/07/2021 08:01   PERIPHERAL VASCULAR CATHETERIZATION  Result Date: 08/06/2021 See surgical note for result.  US Venous Img Lower Right (DVT Study)  Result Date: 08/05/2021 CLINICAL DATA:  Calf swelling. EXAM: RIGHT LOWER EXTREMITY VENOUS DOPPLER ULTRASOUND TECHNIQUE: Gray-scale sonography with compression, as well as color and duplex ultrasound, were performed to evaluate the deep venous system(s) from the level of the common femoral  vein through the popliteal and proximal calf veins. COMPARISON:  RIGHT lower extremity venous duplex, 07/04/2021. RIGHT foot XRs, 08/05/2021. FINDINGS: VENOUS Normal compressibility of the RIGHT common femoral, superficial femoral, and popliteal veins, as well as the visualized calf veins. Visualized portions of profunda femoral vein and great saphenous vein unremarkable. No filling defects to suggest DVT on grayscale or color Doppler imaging. Doppler waveforms show normal direction of venous flow, normal respiratory plasticity and response to augmentation. Limited views of the contralateral common femoral vein are unremarkable. OTHER No evidence of superficial thrombophlebitis or abnormal fluid collection. Subcutaneous edema within the distal RIGHT lower extremity. Limitations: none IMPRESSION: No evidence of femoropopliteal DVT within the RIGHT lower extremity. Michaelle Birks, MD [facility] Radiology Electronically Signed   By: Michaelle Birks M.D.   On: 08/05/2021 16:16  DG Foot Complete Right  Result Date: 08/05/2021 CLINICAL DATA:  Infection and pain in a 65 year old male. Discoloration to the tips of the first, second and third toe. EXAM: RIGHT FOOT COMPLETE - 3+ VIEW COMPARISON:  Jan 02, 2021. FINDINGS: Vascular calcifications in the soft tissues. No discrete soft tissue ulceration visible on radiograph No bony changes to suggest osteomyelitis or acute bone finding. Osteopenia. Diffuse soft tissue swelling is however noted over the dorsum of the foot and plantar aspect of the foot. This is worse in the forefoot. Diffuse soft tissue swelling also in the visualized portions of the LEFT lower extremity. IMPRESSION: 1. No acute bone finding. No discrete soft tissue ulceration visible on radiograph. 2. Diffuse severe soft tissue swelling over the dorsum of the foot and plantar aspect of the foot and extending into the LEFT lower extremity. Findings may represent  severe edema and or infection. Electronically Signed   By: Zetta Bills M.D.   On: 08/05/2021 15:13     Medications:      atorvastatin  10 mg Oral QHS   cinacalcet  60 mg Oral Q supper   diltiazem  180 mg Oral Daily   epoetin (EPOGEN/PROCRIT) injection  4,000 Units Intravenous Q T,Th,Sa-HD   feeding supplement  237 mL Oral BID BM   metoprolol tartrate  100 mg Oral BID   mupirocin ointment  1 application Nasal BID   pantoprazole  40 mg Oral Daily   sevelamer carbonate  2,400 mg Oral TID WC   albuterol, HYDROcodone-acetaminophen  Assessment/ Plan:  [name] is a 65 y.o.  male with a past medical history of Barrett's esophagus, hyperparathyroidism, and end-stage renal disease on dialysis.  Patient reports to the emergency department with complaints of leg pain and swelling.  The patient has been admitted for Limb ischemia [I99.8] Ischemic foot ulcer due to atherosclerosis of native artery of limb ([place]) [I70.25, L97.509]  [facility]/TTS/LUE AVG/71kg  End-stage renal disease on dialysis.  Will maintain outpatient schedule during this admission, if possible. Next treatment scheduled for Saturday  2. Anemia of chronic kidney disease  Lab Results  Component Value Date   HGB 9.1 (L) 08/06/2021    Hemoglobin at goal  3. Secondary Hyperparathyroidism: with outpatient labs:phosphorus 5.3, calcium 6.8 on 07/16/21.   Lab Results  Component Value Date   PTH 93 (H) 04/09/2019   CALCIUM 8.4 (L) 08/06/2021   PHOS 4.7 (H) 08/06/2021  Mircera prescribed biweekly outpatient.  Last dose 07/29/2021.  Calcitriol also prescribed outpatient Continue sevelamer with meals  4.  Ischemic foot ulcer.  Angiogram unsuccessful. Planned Rt BKA today. Will monitor    LOS: 2   12/30/20222:23 PM

## 2021-08-07 NOTE — Anesthesia Postprocedure Evaluation (Signed)
Anesthesia Post Note  Patient: Scott Castro  Procedure(s) Performed: AMPUTATION BELOW KNEE (Right: Knee)  Patient location during evaluation: PACU Anesthesia Type: General Level of consciousness: awake and alert, oriented and patient cooperative Pain management: pain level controlled Vital Signs Assessment: post-procedure vital signs reviewed and stable Respiratory status: spontaneous breathing, nonlabored ventilation and respiratory function stable Cardiovascular status: blood pressure returned to baseline and stable Postop Assessment: adequate PO intake Anesthetic complications: no   No notable events documented.   Last Vitals:  Vitals:   08/07/21 1245 08/07/21 1300  BP: 125/90 127/83  Pulse: (!) 109 (!) 106  Resp: 11 18  Temp: 37.4 C 37.1 C  SpO2: 95% 96%    Last Pain:  Vitals:   08/07/21 1300  TempSrc:   PainSc: [place]

## 2021-08-07 NOTE — TOC Initial Note (Signed)
Transition of Care [facility]) - Initial/Assessment Note    Patient Details  Name: Scott Castro MRN: [id number] Date of Birth: 1955/12/02  [facility] CM/SW Contact:    Magnus Ivan, LCSW Phone Number: 08/07/2021, 9:00 AM  Clinical Narrative:                 Spoke with patient for assessment. Patient lives alone and drives himself to appointments. Says his PCP is his Nephrologist Dr. Smith Mince. [facility]. Patient is active with [facility] who is aware of admission. Patient has home o2 through [employer] and a RW. TOC to follow for needs.   Expected Discharge Plan: [place] Barriers to Discharge: Continued Medical Work up   Patient Goals and CMS Choice Patient states their goals for this hospitalization and ongoing recovery are:: home with home health CMS Medicare.gov Compare Post Acute Care list provided to:: Patient Choice offered to / list presented to : Patient  Expected Discharge Plan and Services Expected Discharge Plan: [place]       Living arrangements for the past 2 months: Single Family Home                                      Prior Living Arrangements/Services Living arrangements for the past 2 months: Single Family Home Lives with:: Self Patient language and need for interpreter reviewed:: Yes Do you feel safe going back to the place where you live?: Yes      Need for Family Participation in Patient Care: Yes (Comment) Care giver support system in place?: Yes (comment) Current home services: DME, Home PT, Home RN Criminal Activity/Legal Involvement Pertinent to Current Situation/Hospitalization: No - Comment as needed  Activities of Daily Living Home Assistive Devices/Equipment: Eyeglasses, Environmental consultant (specify type) [employer] wheel walker) ADL Screening (condition at time of admission) Patient's cognitive ability adequate to safely complete daily activities?: Yes (Patient states "It hurst  to walk.") Is the patient deaf or have difficulty hearing?: No Does the patient have difficulty seeing, even when wearing glasses/contacts?: No Does the patient have difficulty concentrating, remembering, or making decisions?: No Patient able to express need for assistance with ADLs?: Yes Does the patient have difficulty dressing or bathing?: No Independently performs ADLs?: No Communication: Needs assistance Is this a change from baseline?:  (Patient expecting BKA) Dressing (OT): Needs assistance Is this a change from baseline?:  (BKA) Grooming: Needs assistance Is this a change from baseline?:  (Patient expecting BKA) Feeding: Independent Bathing: Needs assistance Is this a change from baseline?:  (Patient expecting BKA) Toileting: Needs assistance Is this a change from baseline?:  (Patient expecting BKA) In/Out Bed: Needs assistance (Patient expecting BKA) Is this a change from baseline?:  (Patient expecting BKA) Walks in Home: Needs assistance Is this a change from baseline?:  (uses walker and walks very slowly) Does the patient have difficulty walking or climbing stairs?: Yes Weakness of Legs: Both (Needs to use arms to stand up.) Weakness of Arms/Hands: None  Permission Sought/Granted Permission sought to share information with : Facility Art therapist granted to share information with : Yes, Verbal Permission Granted     Permission granted to share info w AGENCY: [place], DME agencies        Emotional Assessment       Orientation: : Oriented to Self, Oriented to Place, Oriented to  Time, Oriented to Situation Alcohol / Substance Use: Not Applicable Psych Involvement: No (comment)  Admission diagnosis:  Limb ischemia [I99.8] Ischemic foot ulcer due to atherosclerosis of native artery of limb ([place]) [I70.25, L97.509] Patient Active Problem List   Diagnosis Date Noted   Ischemic foot ulcer due to atherosclerosis of native artery of limb ([place]) 08/05/2021    Atrial fibrillation ([place]) 08/05/2021   Lower limb ischemia 07/18/2021   Atrial fibrillation with RVR ([place]) 07/18/2021   Hypoalbuminemia due to protein-calorie malnutrition ([place]) 07/03/2021   Anemia 07/03/2021   PAD (peripheral artery disease) ([place]) 07/03/2021   GERD (gastroesophageal reflux disease) 07/03/2021   Mixed hyperlipidemia 07/03/2021   Bicipital tenosynovitis 12/04/2020   Localized, primary osteoarthritis of hand 12/04/2020   Hyperkalemia    ESRD (end stage renal disease) ([place]) 09/18/2019   COPD (chronic obstructive pulmonary disease) ([place]) 04/10/2019   Nonintractable epilepsy with complex partial seizures ([place]) 08/20/2014   Essential hypertension 01/09/2013   Lung nodule, multiple 07/28/2012   Arteriovenous fistula ([place]) 01/21/2012   At risk for dental problems 01/21/2012   Barrett esophagus 01/21/2012   FSGS (focal segmental glomerulosclerosis) 01/21/2012   Kidney transplanted 01/21/2012   PCP:  Pcp, No Pharmacy:   [id number] [place] [place] [address] [place] [address] Phone: [phone] Fax: [phone]     Social Determinants of Health (SDOH) Interventions    Readmission Risk Interventions Readmission Risk Prevention Plan 07/22/2021 04/10/2019  Transportation Screening Complete Complete  PCP or Specialist Appt within 3-5 Days Complete Complete  HRI or Home Care Consult Complete Complete  Social Work Consult for [place] Planning/Counseling Complete Complete  Palliative Care Screening Not Applicable -  Medication Review Press photographer) Complete Complete  Some recent data might be hidden

## 2021-08-07 NOTE — Evaluation (Signed)
Physical Therapy Evaluation Patient Details Name: Scott Castro MRN: [id number] DOB: 1955/08/25 Today's Date: 08/07/2021  History of Present Illness  Pt is a 65 y/o M admitted on 08/05/21. Pt with previous admisson on 12/10-12/14 2/2 to R foot pain & pt had ABI & angioplasty & was discharged home. Pt returned to the ED on 08/05/21 with c/o R foot & leg pain, critical limb ischemia requiring heparinization. Pt underwent R BKA on 08/07/21. 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 PT provides encouragement & education re: process s/p amputation. Pt is able to complete bed mobility with supervision<>min assist with use of bed rails, HOB elevated & bed rails. Pt declines getting OOB on this date but agreeable to attempt tomorrow. Pt is very appreciative of PT consult & education, reporting it helps.   Pt reports prior to RLE issues he was independent without AD, living alone, working & driving. Pt is motivated to get better & would benefit from intensive PT services to increase independence with functional mobility.     Recommendations for follow up therapy are one component of a multi-disciplinary discharge planning process, led by the attending physician.  Recommendations may be updated based on patient status, additional functional criteria and insurance authorization.  Follow Up Recommendations Acute inpatient rehab (3hours/day)    Assistance Recommended at Discharge Frequent or constant Supervision/Assistance  Functional Status Assessment Patient has had a recent decline in their functional status and demonstrates the ability to make significant improvements in function in a reasonable and predictable amount of time.  Equipment Recommendations  None recommended by PT (TBD in next venue)    Recommendations for  Other Services       Precautions / Restrictions Precautions Precautions: Fall Restrictions Weight Bearing Restrictions: Yes RLE Weight Bearing: Non weight bearing (s/p BKA)      Mobility  Bed Mobility Overal bed mobility: Needs Assistance Bed Mobility: Supine to Sit;Sit to Supine     Supine to sit: Min assist;HOB elevated Sit to supine: Min guard;HOB elevated   General bed mobility comments: use of bed rails, extra time to upright himself    Transfers                        Ambulation/Gait                  Stairs            Wheelchair Mobility    Modified Rankin (Stroke Patients Only)       Balance Overall balance assessment: Needs assistance Sitting-balance support: Bilateral upper extremity supported;Feet supported Sitting balance-Leahy Scale: Fair Sitting balance - Comments: supervision static sitting EOB                                     Pertinent Vitals/Pain Pain Assessment: Faces Faces Pain Scale: Hurts whole lot Pain Location: R residual limb Pain Descriptors / Indicators: Discomfort;Grimacing (phantom foot pain) Pain Intervention(s): Monitored during session;Repositioned;Limited activity within patient's tolerance    Home Living Family/patient expects to be discharged to:: Private residence Living Arrangements: Alone Available Help at Discharge: Friend(s);Available PRN/intermittently Type of Home: House Home Access: Stairs to enter Entrance Stairs-Rails: None Entrance Stairs-Number of Steps: 1   Home Layout: One level  Prior Function Prior Level of Function : Independent/Modified Independent             Mobility Comments: Independent, 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[employer], driving       Journalist, newspaper        Extremity/Trunk Assessment   Upper Extremity Assessment Upper Extremity Assessment: Generalized weakness    Lower Extremity Assessment Lower Extremity Assessment:  (Minimal knee  AROM observed during session but pt able to move RLE supine<>sit without assistance.)    Cervical / Trunk Assessment Cervical / Trunk Assessment: Normal  Communication   Communication: No difficulties  Cognition Arousal/Alertness: Awake/alert Behavior During Therapy: WFL for tasks assessed/performed Overall Cognitive Status: Within Functional Limits for tasks assessed                                 General Comments: Pt very emotional re: BKA, does not want to look at residual limb & cries when he does.        General Comments General comments (skin integrity, edema, etc.): Pt on 2L/min via nasal cannula with pt reporting this was new for him beginning ~2 weeks ago. PT educates pt on desensitization techniques to help with phantom limb pain. Discussed timeline re: prosthetic use.    Exercises     Assessment/Plan    PT Assessment Patient needs continued PT services  PT Problem List Decreased strength;Decreased mobility;Decreased safety awareness;Decreased balance;Decreased activity tolerance;Decreased knowledge of use of DME;Pain;Impaired sensation;Cardiopulmonary status limiting activity;Decreased skin integrity;Decreased range of motion;Decreased knowledge of precautions       PT Treatment Interventions DME instruction;Therapeutic exercise;Wheelchair mobility training;Gait training;Balance training;Manual techniques;Stair training;Neuromuscular re-education;Modalities;Functional mobility training;Therapeutic activities;Patient/family education    PT Goals (Current goals can be found in the Care Plan section)  Acute Rehab PT Goals Patient Stated Goal: decreased pain, get better PT Goal Formulation: With patient Time For Goal Achievement: 08/21/21 Potential to Achieve Goals: Good    Frequency 7X/week   Barriers to discharge Decreased caregiver support;Inaccessible home environment      Co-evaluation               AM-PAC PT "6 Clicks" Mobility   Outcome Measure Help needed turning from your back to your side while in a flat bed without using bedrails?: A Little Help needed moving from lying on your back to sitting on the side of a flat bed without using bedrails?: A Little Help needed moving to and from a bed to a chair (including a wheelchair)?: A Lot Help needed standing up from a chair using your arms (e.g., wheelchair or bedside chair)?: A Lot Help needed to walk in hospital room?: Total Help needed climbing 3-5 steps with a railing? : Total 6 Click Score: 12    End of Session Equipment Utilized During Treatment: Oxygen Activity Tolerance: Patient tolerated treatment well Patient left: in bed;with call bell/phone within reach;with bed alarm set Nurse Communication: Mobility status PT Visit Diagnosis: Unsteadiness on feet (R26.81);Difficulty in walking, not elsewhere classified (R26.2);Muscle weakness (generalized) (M62.81);Other abnormalities of gait and mobility (R26.89)    Time: 6045-4098 PT Time Calculation (min) (ACUTE ONLY): 20 min   Charges:   PT Evaluation $PT Eval Low Complexity: Eitzen, PT, DPT 08/07/21, 4:36 PM   Waunita Schooner 08/07/2021, 4:34 PM

## 2021-08-07 NOTE — Op Note (Signed)
° °  OPERATIVE NOTE   PROCEDURE: Right below-the-knee amputation  PRE-OPERATIVE DIAGNOSIS: Right foot gangrene  POST-OPERATIVE DIAGNOSIS: same as above  SURGEON: Leotis Pain, MD  ASSISTANT(S): none  ANESTHESIA: general  ESTIMATED BLOOD LOSS: 150 cc  FINDING(S): none  SPECIMEN(S):  Right below-the-knee amputation  INDICATIONS:   Scott Castro is a 65 y.o. male who presents with right leg gangrene.  The patient is scheduled for a right below-the-knee amputation.  I discussed in depth with the patient the risks, benefits, and alternatives to this procedure.  The patient is aware that the risk of this operation included but are not limited to:  bleeding, infection, myocardial infarction, stroke, death, failure to heal amputation wound, and possible need for more proximal amputation.  The patient is aware of the risks and agrees proceed forward with the procedure.  DESCRIPTION:  After full informed written consent was obtained from the patient, the patient was brought back to the operating room, and placed supine upon the operating table.  Prior to induction, the patient received IV antibiotics.  The patient was then prepped and draped in the standard fashion for a below-the-knee amputation.  After obtaining adequate anesthesia, the patient was prepped and draped in the standard fashion for a right below-the-knee amputation.  I marked out the anterior incision two finger breadths below the tibial tuberosity and then the marked out a posterior flap that was one third of the circumference of the calf in length.   I made the incisions for these flaps, and then dissected through the subcutaneous tissue, fascia, and muscle anteriorly.  I elevated  the periosteal tissue superiorly so that the tibia was about 3-4 cm shorter than the anterior skin flap.  I then transected the tibia with a power saw and then took a wedge off the tibia anteriorly with the power saw.  Then I smoothed out the rough edges.  In  a similar fashion, I cut back the fibula about two centimeters higher than the level of the tibia with a bone cutter.  I put a bone hook into the distal tibia and then used a large amputation knife to sharply develop a tissue plane through the muscle along the fibula.  In such fashion, the posterior flap was developed.  At this point, the specimen was passed off the field as the below-the-knee amputation.  At this point, I clamped all visibly bleeding arteries and veins using a combination of suture ligation with Silk suture and electrocautery.  Bleeding continued to be controlled with electrocautery and suture ligature.  The stump was washed off with sterile normal saline and no further active bleeding was noted.  I reapproximated the anterior and posterior fascia  with interrupted stitches of 0 Vicryl.  This was completed along the entire length of anterior and posterior fascia until there were no more loose space in the fascial line. I then placed a layer of 2-0 Vicryl sutures in the subcutaneous tissue. The skin was then  reapproximated with staples.  The stump was washed off and dried.  The incision was dressed with Xeroform and  then fluffs were applied.  Kerlix was wrapped around the leg and then gently an ACE wrap was applied.    COMPLICATIONS: none  CONDITION: stable   Leotis Pain  08/07/2021, 12:14 PM    This note was created with Dragon Medical transcription system. Any errors in dictation are purely unintentional.

## 2021-08-07 NOTE — Anesthesia Preprocedure Evaluation (Addendum)
Anesthesia Evaluation  Patient identified by MRN, date of birth, ID band Patient awake    Reviewed: Allergy & Precautions, NPO status , Patient's Chart, lab work & pertinent test results  History of Anesthesia Complications Negative for: history of anesthetic complications  Airway Mallampati: III   Neck ROM: Full    Dental no notable dental hx.    Pulmonary COPD,  oxygen dependent,    Pulmonary exam normal breath sounds clear to auscultation       Cardiovascular hypertension, + Peripheral Vascular Disease (on Plavix)  + dysrhythmias (a fib on Eliquis at home)  Rhythm:Irregular Rate:Normal  ECG 07/18/21:  Atrial fibrillation LVH with secondary repolarization abnormality Baseline wander in lead(s) V5 V6   Neuro/Psych negative neurological ROS     GI/Hepatic GERD (Barrett esophagus)  ,  Endo/Other  negative endocrine ROS  Renal/GU ESRF and DialysisRenal disease (last HD 08/06/21; s/p renal transplant 1985)     Musculoskeletal   Abdominal   Peds  Hematology negative hematology ROS (+)   Anesthesia Other Findings   Reproductive/Obstetrics                           Anesthesia Physical Anesthesia Plan  ASA: 4  Anesthesia Plan: General   Post-op Pain Management:    Induction: Intravenous  PONV Risk Score and Plan: 2 and Ondansetron, Dexamethasone and Treatment may vary due to age or medical condition  Airway Management Planned: LMA  Additional Equipment:   Intra-op Plan:   Post-operative Plan: Extubation in OR  Informed Consent: I have reviewed the patients History and Physical, chart, labs and discussed the procedure including the risks, benefits and alternatives for the proposed anesthesia with the patient or authorized representative who has indicated his/her understanding and acceptance.     Dental advisory given  Plan Discussed with: CRNA  Anesthesia Plan Comments:  (Patient consented for risks of anesthesia including but not limited to:  - adverse reactions to medications - damage to eyes, teeth, lips or other oral mucosa - nerve damage due to positioning  - sore throat or hoarseness - damage to heart, brain, nerves, lungs, other parts of body or loss of life  Informed patient about role of CRNA in peri- and intra-operative care.  Patient voiced understanding.)       Anesthesia Quick Evaluation

## 2021-08-07 NOTE — Transfer of Care (Signed)
Immediate Anesthesia Transfer of Care Note  Patient: Scott Castro  Procedure(s) Performed: AMPUTATION BELOW KNEE (Right: Knee)  Patient Location: PACU  Anesthesia Type:General  Level of Consciousness: drowsy  Airway & Oxygen Therapy: Patient Spontanous Breathing and Patient connected to face mask oxygen  Post-op Assessment: Report given to RN  Post vital signs: stable  Last Vitals:  Vitals Value Taken Time  BP 157/95 08/07/21 1208  Temp 36.2 C 08/07/21 1208  Pulse 69 08/07/21 1210  Resp 17 08/07/21 1210  SpO2 87 % 08/07/21 1210  Vitals shown include unvalidated device data.  Last Pain:  Vitals:   08/07/21 1006  TempSrc: Tympanic  PainSc: 8          Complications: No notable events documented.

## 2021-08-07 NOTE — Anesthesia Procedure Notes (Signed)
Procedure Name: LMA Insertion Date/Time: 08/07/2021 11:00 AM Performed by: Lerry Liner, CRNA Pre-anesthesia Checklist: Patient identified, Emergency Drugs available, Suction available and Patient being monitored Patient Re-evaluated:Patient Re-evaluated prior to induction Oxygen Delivery Method: Circle system utilized Preoxygenation: Pre-oxygenation with 100% oxygen Induction Type: IV induction Ventilation: Mask ventilation without difficulty LMA: LMA inserted LMA Size: 4.0 Number of attempts: 1 Tube secured with: Tape Dental Injury: Teeth and Oropharynx as per pre-operative assessment

## 2021-08-07 NOTE — Progress Notes (Signed)
PROGRESS NOTE   Scott Castro  VPX:[id number] DOB: 02-24-1956 DOA: 08/05/2021 PCP: Pcp, No  Brief Narrative:  65 year old white male known history ESRD TTS secondary to FSGS, prior renal transplant secondary hyperparathyroidism Barrett's esophagus Multiple lung nodules in the past Paroxysmal A. fib on Eliquis Prior peripheral arterial disease admission 12/10 through 12/14 secondary to right foot pain-had an ABI and had angioplasty 07/20/2021 started on Plavix and discharged home  Return to ED 12/28 right foot pain leg pain critical limb ischemia requiring heparinization Vascular surgeon Dr. Laurence Compton was consulted secondary to concerns for gangrene of the toes 12/29 underwent angiogram + PTCA right tibial peroneal trunk as well as right external iliac stenting 12/30 underwent R BKA  Hospital-Problem based course  RLE critical limb ischemia, s/p ampuatation 12/30 S/p Amputation after PTCA Pain control NOrco TID, Add dilaudid 0.5 q4 prn for severe pain >7 Likely will need SNf--Get PT eval ESRD TTS 2/2 FSGS/Prior transplant Defer to renal--cont Renvela 2.4 g TID, sensipar 60 qd PAFib [place] 2> 4 on eliquis [place]ue Heparin today--transition to Eliquis when okayed by Vascular Cont Cardizem 180 qd Barretts esophagus OP follow up with GI--cont protonix--Screening as OP Multiple lung nodules OP low dose CT   DVT prophylaxis: heparin Code Status: full Family Communication: none Disposition:  Status is: Inpatient  Remains inpatient appropriate because: needs placement       Consultants:  Vasc renal  Procedures: n  Antimicrobials: n    Subjective: Awake coherent in mod -severe pain Just back from surgery  Objective: Vitals:   08/07/21 1208 08/07/21 1215 08/07/21 1230 08/07/21 1245  BP: (!) 157/95 (!) 146/99 (!) 147/84 125/90  Pulse: 72 (!) 118 (!) 120 (!) 109  Resp: (!) 21 18 19 11   Temp: (!) 97.1 F (36.2 C)   99.3 F (37.4 C)  TempSrc:      SpO2: (!) 87% 94% 97% 95%   Weight:      Height:        Intake/Output Summary (Last 24 hours) at 08/07/2021 1301 Last data filed at 08/07/2021 1154 Gross per 24 hour  Intake 150 ml  Output 1550 ml  Net -1400 ml   Filed Weights   08/05/21 1436 08/06/21 1437 08/07/21 1006  Weight: 72.6 kg 72.6 kg 72.6 kg    Examination:  Awake coherent in nad no focal deficit Cta b no added soudn Abd soft nt nd Amputation on R side Power 5/5  Data Reviewed: personally reviewed   CBC    Component Value Date/Time   WBC 5.5 08/06/2021 1225   RBC 2.90 (L) 08/06/2021 1225   HGB 9.1 (L) 08/06/2021 1225   HGB 11.4 (L) 06/11/2012 0501   HCT 29.1 (L) 08/06/2021 1225   HCT 34.6 (L) 06/11/2012 0501   PLT 255 08/06/2021 1225   PLT 251 06/11/2012 0501   MCV 100.3 (H) 08/06/2021 1225   MCV 95 06/11/2012 0501   MCH 31.4 08/06/2021 1225   MCHC 31.3 08/06/2021 1225   RDW 16.0 (H) 08/06/2021 1225   RDW 14.4 06/11/2012 0501   LYMPHSABS 1.1 08/06/2021 1225   LYMPHSABS 1.2 06/11/2012 0501   MONOABS 0.9 08/06/2021 1225   MONOABS 0.7 06/11/2012 0501   EOSABS 0.1 08/06/2021 1225   EOSABS 0.2 06/11/2012 0501   BASOSABS 0.1 08/06/2021 1225   BASOSABS 0.1 06/11/2012 0501   CMP Latest Ref Rng & Units 08/06/2021 08/05/2021 08/05/2021  Glucose 70 - 99 mg/dL 69(L) - 98  BUN 8 - 23 mg/dL 22 - 40(H)  Creatinine 0.61 - 1.24 mg/dL 5.19(H) 7.69(H) 7.58(H)  Sodium 135 - 145 mmol/L 139 - 138  Potassium 3.5 - 5.1 mmol/L 4.3 - 5.1  Chloride 98 - 111 mmol/L 98 - 98  CO2 22 - 32 mmol/L 27 - 28  Calcium 8.9 - 10.3 mg/dL 8.4(L) - 8.4(L)  Total Protein 6.5 - 8.1 g/dL - - 7.6  Total Bilirubin 0.3 - 1.2 mg/dL - - 0.7  Alkaline Phos 38 - 126 U/L - - 92  AST 15 - 41 U/L - - 14(L)  ALT 0 - 44 U/L - - 9     Radiology Studies: MR FOOT RIGHT WO CONTRAST  Result Date: 08/07/2021 CLINICAL DATA:  Right foot and leg pain. History of dialysis. Foot swelling. EXAM: MRI OF THE RIGHT FOREFOOT WITHOUT CONTRAST TECHNIQUE: Multiplanar, multisequence  MR imaging of the right foot was performed. No intravenous contrast was administered. COMPARISON:  None. FINDINGS: Bones/Joint/Cartilage Soft tissue wound at the tip of the great toe. Bone marrow edema in the first proximal and distal phalanx, second middle and distal phalanx, third distal phalanx, fourth middle and distal phalanx and fifth distal phalanx concerning for osteomyelitis. Mild marrow edema in the shaft of the fifth metatarsal and fifth proximal phalanx concerning for stress reaction versus osteomyelitis. Normal alignment. No joint effusion. Ligaments Collateral ligaments are intact.  Lisfranc ligament is intact. Muscles and Tendons Flexor, peroneal and extensor compartment tendons are intact. Muscles are normal. Soft tissue No fluid collection or hematoma. No soft tissue mass. Severe soft tissue edema involving the foot and ankle which may be reactive versus secondary to osteomyelitis. IMPRESSION: 1. Soft tissue wound at the tip of the great toe. Bone marrow edema in the first proximal and distal phalanx, second middle and distal phalanx, third distal phalanx, fourth middle and distal phalanx and fifth distal phalanx concerning for osteomyelitis. 2. Mild marrow edema in the shaft of the fifth metatarsal and fifth proximal phalanx concerning for stress reaction versus osteomyelitis. 3. Severe soft tissue edema involving the foot and ankle which may be reactive versus secondary to osteomyelitis. Electronically Signed   By: Kathreen Devoid M.D.   On: 08/07/2021 08:01   PERIPHERAL VASCULAR CATHETERIZATION  Result Date: 08/06/2021 See surgical note for result.  US Venous Img Lower Right (DVT Study)  Result Date: 08/05/2021 CLINICAL DATA:  Calf swelling. EXAM: RIGHT LOWER EXTREMITY VENOUS DOPPLER ULTRASOUND TECHNIQUE: Gray-scale sonography with compression, as well as color and duplex ultrasound, were performed to evaluate the deep venous system(s) from the level of the common femoral vein through the  popliteal and proximal calf veins. COMPARISON:  RIGHT lower extremity venous duplex, 07/04/2021. RIGHT foot XRs, 08/05/2021. FINDINGS: VENOUS Normal compressibility of the RIGHT common femoral, superficial femoral, and popliteal veins, as well as the visualized calf veins. Visualized portions of profunda femoral vein and great saphenous vein unremarkable. No filling defects to suggest DVT on grayscale or color Doppler imaging. Doppler waveforms show normal direction of venous flow, normal respiratory plasticity and response to augmentation. Limited views of the contralateral common femoral vein are unremarkable. OTHER No evidence of superficial thrombophlebitis or abnormal fluid collection. Subcutaneous edema within the distal RIGHT lower extremity. Limitations: none IMPRESSION: No evidence of femoropopliteal DVT within the RIGHT lower extremity. Michaelle Birks, MD [facility] Electronically Signed   By: Michaelle Birks M.D.   On: 08/05/2021 16:16   DG Foot Complete Right  Result Date: 08/05/2021 CLINICAL DATA:  Infection and pain in a 65 year old  male. Discoloration to the tips of the first, second and third toe. EXAM: RIGHT FOOT COMPLETE - 3+ VIEW COMPARISON:  Jan 02, 2021. FINDINGS: Vascular calcifications in the soft tissues. No discrete soft tissue ulceration visible on radiograph No bony changes to suggest osteomyelitis or acute bone finding. Osteopenia. Diffuse soft tissue swelling is however noted over the dorsum of the foot and plantar aspect of the foot. This is worse in the forefoot. Diffuse soft tissue swelling also in the visualized portions of the LEFT lower extremity. IMPRESSION: 1. No acute bone finding. No discrete soft tissue ulceration visible on radiograph. 2. Diffuse severe soft tissue swelling over the dorsum of the foot and plantar aspect of the foot and extending into the LEFT lower extremity. Findings may represent severe edema and or  infection. Electronically Signed   By: Zetta Bills M.D.   On: 08/05/2021 15:13     Scheduled Meds:  [MAR Hold] atorvastatin  10 mg Oral QHS   [MAR Hold] cinacalcet  60 mg Oral Q supper   [MAR Hold] diltiazem  180 mg Oral Daily   [MAR Hold] epoetin (EPOGEN/PROCRIT) injection  4,000 Units Intravenous Q T,Th,Sa-HD   [MAR Hold] feeding supplement  237 mL Oral BID BM   [MAR Hold] metoprolol tartrate  100 mg Oral BID   [MAR Hold] mupirocin ointment  1 application Nasal BID   [MAR Hold] pantoprazole  40 mg Oral Daily   [MAR Hold] sevelamer carbonate  2,400 mg Oral TID WC   Continuous Infusions:  sodium chloride 20 mL/hr at 08/07/21 1046   acetaminophen     lactated ringers       LOS: 2 days   Time spent: [place], MD [facility] To contact the attending provider between 7A-7P or the covering provider during after hours 7P-7A, please log into the web site [email or web address] and access using universal [place] password for that web site. If you do not have the password, please call the hospital operator.  08/07/2021, 1:01 PM

## 2021-08-07 NOTE — Progress Notes (Signed)
? ?  Inpatient Rehab Admissions Coordinator : ? ?Per therapy recommendations, patient was screened for CIR candidacy by Paris Hohn RN MSN.  At this time patient appears to be a potential candidate for CIR. I will place a rehab consult per protocol for full assessment. Please call me with any questions. ? ?Naftali Carchi RN MSN ?Admissions Coordinator ?[phone] ?  ?

## 2021-08-08 LAB — BASIC METABOLIC PANEL
Anion gap: 12 (ref 5–15)
BUN: 44 mg/dL — ABNORMAL HIGH (ref 8–23)
CO2: 27 mmol/L (ref 22–32)
Calcium: 7.5 mg/dL — ABNORMAL LOW (ref 8.9–10.3)
Chloride: 97 mmol/L — ABNORMAL LOW (ref 98–111)
Creatinine, Ser: 7.64 mg/dL — ABNORMAL HIGH (ref 0.61–1.24)
GFR, Estimated: 7 mL/min — ABNORMAL LOW (ref >60)
Glucose, Bld: 91 mg/dL (ref 70–99)
Potassium: 5.1 mmol/L (ref 3.5–5.1)
Sodium: 136 mmol/L (ref 135–145)

## 2021-08-08 LAB — CBC
HCT: 26 % — ABNORMAL LOW (ref 39.0–52.0)
Hemoglobin: 8.2 g/dL — ABNORMAL LOW (ref 13.0–17.0)
MCH: 32.3 pg (ref 26.0–34.0)
MCHC: 31.5 g/dL (ref 30.0–36.0)
MCV: 102.4 fL — ABNORMAL HIGH (ref 80.0–100.0)
Platelets: 199 10*3/uL (ref 150–400)
RBC: 2.54 MIL/uL — ABNORMAL LOW (ref 4.22–5.81)
RDW: 16 % — ABNORMAL HIGH (ref 11.5–15.5)
WBC: 7.7 10*3/uL (ref 4.0–10.5)
nRBC: 0 % (ref 0.0–0.2)

## 2021-08-08 MED ORDER — EPOETIN ALFA 10000 UNIT/ML IJ SOLN
8000.0000 [IU] | INTRAMUSCULAR | Status: DC
  Administered 2021-08-08 – 2021-08-11 (×2): 8000 [IU] via INTRAVENOUS
  Filled 2021-08-08 (×2): qty 1

## 2021-08-08 MED ORDER — METAXALONE 800 MG PO TABS
400.0000 mg | ORAL_TABLET | Freq: Three times a day (TID) | ORAL | Status: DC
  Administered 2021-08-08 – 2021-08-12 (×12): 400 mg via ORAL
  Filled 2021-08-08 (×14): qty 0.5

## 2021-08-08 MED ORDER — HYDROMORPHONE HCL 1 MG/ML IJ SOLN
1.0000 mg | INTRAMUSCULAR | Status: DC | PRN
  Administered 2021-08-08 – 2021-08-11 (×8): 1 mg via INTRAVENOUS
  Filled 2021-08-08 (×9): qty 1

## 2021-08-08 MED ORDER — METOPROLOL TARTRATE 25 MG PO TABS
12.5000 mg | ORAL_TABLET | Freq: Two times a day (BID) | ORAL | Status: DC
  Administered 2021-08-08 – 2021-08-12 (×8): 12.5 mg via ORAL
  Filled 2021-08-08 (×8): qty 1

## 2021-08-08 MED ORDER — GABAPENTIN 100 MG PO CAPS
100.0000 mg | ORAL_CAPSULE | Freq: Two times a day (BID) | ORAL | Status: DC
  Administered 2021-08-08 – 2021-08-12 (×8): 100 mg via ORAL
  Filled 2021-08-08 (×8): qty 1

## 2021-08-08 MED ORDER — APIXABAN 5 MG PO TABS: 5.0000 mg | ORAL_TABLET | Freq: Two times a day (BID) | ORAL | Status: DC

## 2021-08-08 MED ORDER — EPOETIN ALFA 10000 UNIT/ML IJ SOLN
INTRAMUSCULAR | Status: AC
  Filled 2021-08-08: qty 1

## 2021-08-08 NOTE — Progress Notes (Signed)
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 fib on Eliquis Prior peripheral arterial disease admission 12/10 through 12/14 secondary to right foot pain-had an ABI and had angioplasty 07/20/2021 started on Plavix and discharged home  Return to ED 12/28 right foot pain leg pain critical limb ischemia requiring heparinization Vascular surgeon Dr. Laurence [place] was consulted secondary to concerns for gangrene of the toes 12/29 underwent angiogram + PTCA right tibial peroneal trunk as well as right external iliac stenting 12/30 underwent R BKA  Hospital-Problem based course  RLE critical limb ischemia, s/p ampuatation 12/30 S/p Amputation after PTCA Pain control Norco TID, as patient's pain is not controlled we will increase Dilaudid to 1 mg every 4 IV severe pain >7 I will also start gabapentin 100 twice daily 12/31 for neuropathic component to pain Therapy is recommending CIR placement ESRD TTS 2/2 FSGS/Prior transplant Defer to renal--cont Renvela 2.4 g TID, sensipar 60 qd PAFib [place] 2> 4 on eliquis Eliquis 5 twice daily resumed Cont Cardizem 180 qd, metoprolol 100 twice daily-overall weight rate controlled Anemia of expected blood loss superimposed on anemia of renal disease, macrocytic Obtain iron studies with next lab draw Transfusion threshold is below 7 Barretts esophagus OP follow up with GI--cont protonix--Screening as OP Multiple lung nodules OP low dose CT Moderate malnutrition Supplement as able   DVT prophylaxis: heparin Code Status: full Family Communication: none Disposition:  Status is: Inpatient  Remains inpatient appropriate because: needs placement    Consultants:  Vasc renal  Procedures: n  Antimicrobials: n     Subjective:  States pain quite severe on movement still not controlled I discussed that we can go up on the IV pain meds He is about to go to dialysis he has no chest pain   Objective: Vitals:   08/07/21 1927 08/07/21 2324 08/08/21 0800 08/08/21 0916  BP: 103/70 101/83 108/74 115/75  Pulse: 99 96 (!) 117 (!) 117  Resp: 15 15 16  (!) 23  Temp: 98.4 F (36.9 C) 98.4 F (36.9 C) 98.2 F (36.8 C) 99.4 F (37.4 C)  TempSrc:    Oral  SpO2: 100%  100% 97%  Weight:    75.5 kg  Height:        Intake/Output Summary (Last 24 hours) at 08/08/2021 0933 Last data filed at 08/07/2021 1511 Gross per 24 hour  Intake 100 ml  Output 50 ml  Net 50 ml    Filed Weights   08/06/21 1437 08/07/21 1006 08/08/21 0916  Weight: 72.6 kg 72.6 kg 75.5 kg    Examination:  coherent in nad no focal deficit Cta b no added sound Abd soft nt nd Amputation on R side--site not reviewed today Power 5/5  Data Reviewed: personally reviewed   CBC    Component Value Date/Time   WBC 7.7 08/08/2021 0516   RBC 2.54 (L) 08/08/2021 0516   HGB 8.2 (L) 08/08/2021 0516   HGB 11.4 (L) 06/11/2012 0501   HCT 26.0 (L) 08/08/2021 0516   HCT 34.6 (L) 06/11/2012 0501   PLT 199 08/08/2021 0516   PLT 251 06/11/2012 0501   MCV 102.4 (H) 08/08/2021 0516   MCV 95 06/11/2012 0501   MCH 32.3 08/08/2021 0516   MCHC 31.5 08/08/2021 0516   RDW 16.0 (H) 08/08/2021 0102  RDW 14.4 06/11/2012 0501   LYMPHSABS 1.1 08/06/2021 1225   LYMPHSABS 1.2 06/11/2012 0501   MONOABS 0.9 08/06/2021 1225   MONOABS 0.7 06/11/2012 0501   EOSABS 0.1 08/06/2021 1225   EOSABS 0.2 06/11/2012 0501   BASOSABS 0.1 08/06/2021 1225   BASOSABS 0.1 06/11/2012 0501   CMP Latest Ref Rng & Units 08/08/2021 08/06/2021 08/05/2021  Glucose 70 - 99 mg/dL 91 69(L) -  BUN 8 - 23 mg/dL 44(H) 22 -  Creatinine 0.61 - 1.24 mg/dL 7.64(H) 5.19(H) 7.69(H)  Sodium 135 - 145 mmol/L 136 139 -  Potassium 3.5 - 5.1 mmol/L 5.1 4.3 -  Chloride 98 - 111  mmol/L 97(L) 98 -  CO2 22 - 32 mmol/L 27 27 -  Calcium 8.9 - 10.3 mg/dL 7.5(L) 8.4(L) -  Total Protein 6.5 - 8.1 g/dL - - -  Total Bilirubin 0.3 - 1.2 mg/dL - - -  Alkaline Phos 38 - 126 U/L - - -  AST 15 - 41 U/L - - -  ALT 0 - 44 U/L - - -     Radiology Studies: MR FOOT RIGHT WO CONTRAST  Result Date: 08/07/2021 CLINICAL DATA:  Right foot and leg pain. History of dialysis. Foot swelling. EXAM: MRI OF THE RIGHT FOREFOOT WITHOUT CONTRAST TECHNIQUE: Multiplanar, multisequence MR imaging of the right foot was performed. No intravenous contrast was administered. COMPARISON:  None. FINDINGS: Bones/Joint/Cartilage Soft tissue wound at the tip of the great toe. Bone marrow edema in the first proximal and distal phalanx, second middle and distal phalanx, third distal phalanx, fourth middle and distal phalanx and fifth distal phalanx concerning for osteomyelitis. Mild marrow edema in the shaft of the fifth metatarsal and fifth proximal phalanx concerning for stress reaction versus osteomyelitis. Normal alignment. No joint effusion. Ligaments Collateral ligaments are intact.  Lisfranc ligament is intact. Muscles and Tendons Flexor, peroneal and extensor compartment tendons are intact. Muscles are normal. Soft tissue No fluid collection or hematoma. No soft tissue mass. Severe soft tissue edema involving the foot and ankle which may be reactive versus secondary to osteomyelitis. IMPRESSION: 1. Soft tissue wound at the tip of the great toe. Bone marrow edema in the first proximal and distal phalanx, second middle and distal phalanx, third distal phalanx, fourth middle and distal phalanx and fifth distal phalanx concerning for osteomyelitis. 2. Mild marrow edema in the shaft of the fifth metatarsal and fifth proximal phalanx concerning for stress reaction versus osteomyelitis. 3. Severe soft tissue edema involving the foot and ankle which may be reactive versus secondary to osteomyelitis. Electronically Signed    By: Kathreen Devoid M.D.   On: 08/07/2021 08:01   PERIPHERAL VASCULAR CATHETERIZATION  Result Date: 08/06/2021 See surgical note for result.    Scheduled Meds:  apixaban  5 mg Oral BID   atorvastatin  10 mg Oral QHS   cinacalcet  60 mg Oral Q supper   diltiazem  180 mg Oral Daily   epoetin (EPOGEN/PROCRIT) injection  8,000 Units Intravenous Q T,Th,Sa-HD   feeding supplement  237 mL Oral BID BM   metoprolol tartrate  100 mg Oral BID   mupirocin ointment  1 application Nasal BID   pantoprazole  40 mg Oral Daily   sevelamer carbonate  2,400 mg Oral TID WC   Continuous Infusions:     LOS: 3 days   Time spent: [place], MD [facility] To contact the attending provider between 7A-7P or the covering provider during after hours 7P-7A, please  log into the web site [email or web address] and access using universal [place] password for that web site. If you do not have the password, please call the hospital operator.  08/08/2021, 9:33 AM

## 2021-08-08 NOTE — Plan of Care (Signed)

## 2021-08-08 NOTE — Progress Notes (Signed)
Inpatient Rehab Admissions Coordinator:  Consult received. Awaiting updated therapy notes. Will continue to follow.   Gayland Curry, [place], [place] Admissions Coordinator [phone]

## 2021-08-08 NOTE — Progress Notes (Signed)
PT Cancellation Note  Patient Details Name: Scott Castro MRN: [id number] DOB: [birth year]   Cancelled Treatment:    Reason Eval/Treat Not Completed: Patient at procedure or test/unavailable Due to patient being off the floor at dialysis, will hold on PT at this time, and will re-attempt at a later time/date as available and patient medically appropriate for PT. Thank you!  Iva Boop, PT  08/08/21. 1:03 PM

## 2021-08-08 NOTE — Progress Notes (Signed)
[facility]  ROUNDING NOTE   Subjective:   Scott Castro is a 65 year old male with a past medical history of Barrett's esophagus, hyperparathyroidism, and end-stage renal disease on dialysis.  Patient reports to the emergency department with complaints of leg pain and swelling.  The patient has been admitted for Limb ischemia [I99.8] Ischemic foot ulcer due to atherosclerosis of native artery of limb ([place]) [I70.25, L97.509]  Patient is known to our practice through previous hospitalizations.  Patient receives outpatient dialysis treatments at [facility], supervised by [facility] physicians.    Patient seen today on hemodialysis Patient main complaint in today visit was that he is in pain from his recent amputation Patient informed me that he recently received Dilaudid and his pain is easing   Objective:  Vital signs in last 24 hours:  Temp:  [97.1 F (36.2 C)-99.4 F (37.4 C)] 99.4 F (37.4 C) (12/31 0916) Pulse Rate:  [51-120] 117 (12/31 0916) Resp:  [11-23] 23 (12/31 0916) BP: (101-157)/(70-99) 115/75 (12/31 0916) SpO2:  [87 %-100 %] 97 % (12/31 0916) Weight:  [75.5 kg] 75.5 kg (12/31 0916)  Weight change: 0 kg Filed Weights   08/06/21 1437 08/07/21 1006 08/08/21 0916  Weight: 72.6 kg 72.6 kg 75.5 kg    Intake/Output: I/O last 3 completed shifts: In: 100 [I.V.:100] Out: 50 [Blood:50]   Intake/Output this shift:  No intake/output data recorded.  Physical Exam: General: NAD, resting on bed  Head: Normocephalic, atraumatic. Moist oral mucosal membranes  Eyes: Anicteric  Neck: Supple, trachea midline  Lungs:  Clear to auscultation, normal effort  Heart: Irregular rate and rhythm  Abdomen:  Soft, nontender  Extremities: No peripheral edema.RLE    BKA  Neurologic: Nonfocal, moving all four extremities     Access: LUE AVG 2 needles in situ    Basic Metabolic Panel: Recent Labs  Lab 08/05/21 1440 08/05/21 2258 08/06/21 1725 08/08/21 0516  NA 138   --  139 136  K 5.1  --  4.3 5.1  CL 98  --  98 97*  CO2 28  --  27 27  GLUCOSE 98  --  69* 91  BUN 40*  --  22 44*  CREATININE 7.58* 7.69* 5.19* 7.64*  CALCIUM 8.4*  --  8.4* 7.5*  MG  --   --  1.8  --   PHOS  --   --  4.7*  --     Liver Function Tests: Recent Labs  Lab 08/05/21 1440 08/06/21 1725  AST 14*  --   ALT 9  --   ALKPHOS 92  --   BILITOT 0.7  --   PROT 7.6  --   ALBUMIN 2.8* 2.7*   No results for input(s): LIPASE, AMYLASE in the last 168 hours. No results for input(s): AMMONIA in the last 168 hours.  CBC: Recent Labs  Lab 08/05/21 1440 08/05/21 2258 08/06/21 1225 08/08/21 0516  WBC 7.8 6.8 5.5 7.7  NEUTROABS 5.9  --  3.3  --   HGB 10.5* 9.1* 9.1* 8.2*  HCT 33.9* 29.4* 29.1* 26.0*  MCV 103.0* 102.1* 100.3* 102.4*  PLT 283 248 255 199    Cardiac Enzymes: No results for input(s): CKTOTAL, CKMB, CKMBINDEX, TROPONINI in the last 168 hours.  BNP: Invalid input(s): POCBNP  CBG: Recent Labs  Lab 08/07/21 2155  GLUCAP 117*    Microbiology: Results for orders placed or performed during the hospital encounter of 08/05/21  Culture, blood (routine x 2)  Status: None (Preliminary result)   Collection Time: 08/05/21  2:40 PM   Specimen: BLOOD  Result Value Ref Range Status   Specimen Description BLOOD RIGHT ANTECUBITAL  Final   Special Requests   Final    BOTTLES DRAWN AEROBIC AND ANAEROBIC Blood Culture results may not be optimal due to an inadequate volume of blood received in culture bottles   Culture   Final    NO GROWTH 3 DAYS Performed at [facility], [address] [place], [place] [address]    Report Status PENDING  Incomplete  Culture, blood (routine x 2)     Status: None (Preliminary result)   Collection Time: 08/05/21  3:30 PM   Specimen: BLOOD  Result Value Ref Range Status   Specimen Description BLOOD BLOOD RIGHT HAND  Final   Special Requests   Final    BOTTLES DRAWN AEROBIC AND ANAEROBIC Blood Culture adequate volume    Culture   Final    NO GROWTH 3 DAYS Performed at [facility], [address] [place], [place] [address]    Report Status PENDING  Incomplete  Resp Panel by RT-PCR (Flu A&B, Covid) Nasopharyngeal Swab     Status: None   Collection Time: 08/05/21  6:36 PM   Specimen: Nasopharyngeal Swab; Nasopharyngeal(NP) swabs in vial transport medium  Result Value Ref Range Status   SARS Coronavirus 2 by RT PCR NEGATIVE NEGATIVE Final    Comment: (NOTE) SARS-CoV-2 target nucleic acids are NOT DETECTED.  The SARS-CoV-2 RNA is generally detectable in upper respiratory specimens during the acute phase of infection. The lowest concentration of SARS-CoV-2 viral copies this assay can detect is 138 copies/mL. A negative result does not preclude SARS-Cov-2 infection and should not be used as the sole basis for treatment or other patient management decisions. A negative result may occur with  improper specimen collection/handling, submission of specimen other than nasopharyngeal swab, presence of viral mutation(s) within the areas targeted by this assay, and inadequate number of viral copies(<138 copies/mL). A negative result must be combined with clinical observations, patient history, and epidemiological information. The expected result is Negative.  Fact Sheet for Patients:  EntrepreneurPulse.com.au  Fact Sheet for Healthcare Providers:  IncredibleEmployment.be  This test is no t yet approved or cleared by the [place] FDA and  has been authorized for detection and/or diagnosis of SARS-CoV-2 by FDA under an Emergency Use Authorization (EUA). This EUA will remain  in effect (meaning this test can be used) for the duration of the COVID-19 declaration under Section 564(b)(1) of the Act, 21 U.S.C.section 360bbb-3(b)(1), unless the authorization is terminated  or revoked sooner.       Influenza A by PCR NEGATIVE NEGATIVE Final   Influenza B by  PCR NEGATIVE NEGATIVE Final    Comment: (NOTE) The Xpert Xpress SARS-CoV-2/FLU/RSV plus assay is intended as an aid in the diagnosis of influenza from Nasopharyngeal swab specimens and should not be used as a sole basis for treatment. Nasal washings and aspirates are unacceptable for Xpert Xpress SARS-CoV-2/FLU/RSV testing.  Fact Sheet for Patients: EntrepreneurPulse.com.au  Fact Sheet for Healthcare Providers: IncredibleEmployment.be  This test is not yet approved or cleared by the [place] FDA and has been authorized for detection and/or diagnosis of SARS-CoV-2 by FDA under an Emergency Use Authorization (EUA). This EUA will remain in effect (meaning this test can be used) for the duration of the COVID-19 declaration under Section 564(b)(1) of the Act, 21 U.S.C. section 360bbb-3(b)(1), unless the authorization is  terminated or revoked.  Performed at [facility], [address] [place], [place] [address]   Surgical PCR screen     Status: None   Collection Time: 08/06/21 11:23 PM   Specimen: Nasal Mucosa; Nasal Swab  Result Value Ref Range Status   MRSA, PCR NEGATIVE NEGATIVE Final   Staphylococcus aureus NEGATIVE NEGATIVE Final    Comment: (NOTE) The Xpert SA Assay (FDA approved for NASAL specimens in patients 15 years of age and older), is one component of a comprehensive surveillance program. It is not intended to diagnose infection nor to guide or monitor treatment. Performed at [facility], [address] [place], [place] [address]     Coagulation Studies: Recent Labs    08/05/21 1846  LABPROT 20.3*  INR 1.7*    Urinalysis: No results for input(s): COLORURINE, LABSPEC, PHURINE, GLUCOSEU, HGBUR, BILIRUBINUR, KETONESUR, PROTEINUR, UROBILINOGEN, NITRITE, LEUKOCYTESUR in the last 72 hours.  Invalid input(s): APPERANCEUR    Imaging: MR FOOT RIGHT WO CONTRAST  Result Date: 08/07/2021 CLINICAL  DATA:  Right foot and leg pain. History of dialysis. Foot swelling. EXAM: MRI OF THE RIGHT FOREFOOT WITHOUT CONTRAST TECHNIQUE: Multiplanar, multisequence MR imaging of the right foot was performed. No intravenous contrast was administered. COMPARISON:  None. FINDINGS: Bones/Joint/Cartilage Soft tissue wound at the tip of the great toe. Bone marrow edema in the first proximal and distal phalanx, second middle and distal phalanx, third distal phalanx, fourth middle and distal phalanx and fifth distal phalanx concerning for osteomyelitis. Mild marrow edema in the shaft of the fifth metatarsal and fifth proximal phalanx concerning for stress reaction versus osteomyelitis. Normal alignment. No joint effusion. Ligaments Collateral ligaments are intact.  Lisfranc ligament is intact. Muscles and Tendons Flexor, peroneal and extensor compartment tendons are intact. Muscles are normal. Soft tissue No fluid collection or hematoma. No soft tissue mass. Severe soft tissue edema involving the foot and ankle which may be reactive versus secondary to osteomyelitis. IMPRESSION: 1. Soft tissue wound at the tip of the great toe. Bone marrow edema in the first proximal and distal phalanx, second middle and distal phalanx, third distal phalanx, fourth middle and distal phalanx and fifth distal phalanx concerning for osteomyelitis. 2. Mild marrow edema in the shaft of the fifth metatarsal and fifth proximal phalanx concerning for stress reaction versus osteomyelitis. 3. Severe soft tissue edema involving the foot and ankle which may be reactive versus secondary to osteomyelitis. Electronically Signed   By: Kathreen Devoid M.D.   On: 08/07/2021 08:01   PERIPHERAL VASCULAR CATHETERIZATION  Result Date: 08/06/2021 See surgical note for result.    Medications:      apixaban  5 mg Oral BID   atorvastatin  10 mg Oral QHS   cinacalcet  60 mg Oral Q supper   diltiazem  180 mg Oral Daily   epoetin (EPOGEN/PROCRIT) injection  8,000  Units Intravenous Q T,Th,Sa-HD   feeding supplement  237 mL Oral BID BM   gabapentin  100 mg Oral BID   metoprolol tartrate  100 mg Oral BID   mupirocin ointment  1 application Nasal BID   pantoprazole  40 mg Oral Daily   sevelamer carbonate  2,400 mg Oral TID WC   albuterol, HYDROcodone-acetaminophen, HYDROmorphone (DILAUDID) injection  Assessment/ Plan:  Scott Castro is a 65 y.o.  male with a past medical history of Barrett's esophagus, hyperparathyroidism, and end-stage renal disease on dialysis.  Patient reports to the emergency department with complaints of leg pain and  swelling.  The patient has been admitted for Limb ischemia [I99.8] Ischemic foot ulcer due to atherosclerosis of native artery of limb ([place]) [I70.25, L97.509]  [facility]/TTS/LUE AVG/71kg  End-stage renal disease on dialysis.  Patient is on Tuesday Thursday Saturday schedule as an outpatient We will maintain outpatient schedule during this admission, if possible.  Patient is scheduled to have renal placement therapy today-Saturday  2. Anemia of chronic kidney disease  Lab Results  Component Value Date   HGB 8.2 (L) 08/08/2021    Hemoglobin is not at goal Mircera prescribed biweekly outpatient.  Last dose 07/29/2021.  We will Increase the dose/ patient on Epogen  3. Secondary Hyperparathyroidism: with outpatient labs:phosphorus 5.3, calcium 6.8 on 07/16/21.   Lab Results  Component Value Date   PTH 93 (H) 04/09/2019   CALCIUM 7.5 (L) 08/08/2021   PHOS 4.7 (H) 08/06/2021  Calcitriol also prescribed outpatient Continue sevelamer with meals  4.  Ischemic foot ulcer.   Patient is now s/p BKA  Vascular team and primary team are following     LOS: 3 Doreen Garretson s [facility] 12/31/202210:29 AM

## 2021-08-08 NOTE — Progress Notes (Signed)
OT Cancellation Note  Patient Details Name: Scott Castro MRN: [id number] DOB: Feb 18, 1956   Cancelled Treatment:    Reason Eval/Treat Not Completed: Patient at procedure or test/ unavailable. OT order received. Chart reviewed. Upon arrival to unit, pt being taken by hospital transport team. Per RN, pt going to dialysis. Will hold OT evaluation at this time and re-attempt at a later date/time as available and pt medically appropriate for OT evaluation.   Shara Blazing, M.S., OTR/L [facility] - [facility] [facility] Ascom: [phone] 08/08/21, 8:59 AM

## 2021-08-08 NOTE — TOC Progression Note (Signed)
Transition of Care [facility]) - Progression Note    Patient Details  Name: Scott Castro MRN: [id number] Date of Birth: 02/27/1956  Transition of Care [facility]) CM/SW [place], LCSW Phone Number: 08/08/2021, 4:28 PM  Clinical Narrative:   CSW is following for CIR screening results.    Expected Discharge Plan: [place] Barriers to Discharge: Continued Medical Work up  Expected Discharge Plan and Services Expected Discharge Plan: [place] arrangements for the past 2 months: Single Family Home                                       Social Determinants of Health (SDOH) Interventions    Readmission Risk Interventions Readmission Risk Prevention Plan 07/22/2021 04/10/2019  Transportation Screening Complete Complete  PCP or Specialist Appt within 3-5 Days Complete Complete  HRI or [place] Complete Complete  Social Work Consult for [place] Planning/Counseling Complete Complete  Palliative Care Screening Not Applicable -  Medication Review Press photographer) Complete Complete  Some recent data might be hidden

## 2021-08-08 NOTE — Progress Notes (Signed)
Order Requisition to provide support for patient who is now an amputee. He shared his thoughts and emotions. He understand the long road ahead as he named it and how difficult to see the goal when you are just at the start. His friend bedside provided encouragement as well.

## 2021-08-09 DIAGNOSIS — I4891 Unspecified atrial fibrillation: Secondary | ICD-10-CM

## 2021-08-09 DIAGNOSIS — L97509 Non-pressure chronic ulcer of other part of unspecified foot with unspecified severity: Secondary | ICD-10-CM

## 2021-08-09 DIAGNOSIS — K219 Gastro-esophageal reflux disease without esophagitis: Secondary | ICD-10-CM | POA: Diagnosis not present

## 2021-08-09 DIAGNOSIS — I7025 Atherosclerosis of native arteries of other extremities with ulceration: Secondary | ICD-10-CM | POA: Diagnosis not present

## 2021-08-09 DIAGNOSIS — N186 End stage renal disease: Secondary | ICD-10-CM | POA: Diagnosis not present

## 2021-08-09 LAB — CBC WITH DIFFERENTIAL/PLATELET
Abs Immature Granulocytes: 0.04 10*3/uL (ref 0.00–0.07)
Basophils Absolute: 0 10*3/uL (ref 0.0–0.1)
Basophils Relative: 1 %
Eosinophils Absolute: 0.1 10*3/uL (ref 0.0–0.5)
Eosinophils Relative: 1 %
HCT: 27.7 % — ABNORMAL LOW (ref 39.0–52.0)
Hemoglobin: 8.5 g/dL — ABNORMAL LOW (ref 13.0–17.0)
Immature Granulocytes: 1 %
Lymphocytes Relative: 11 %
Lymphs Abs: 0.9 10*3/uL (ref 0.7–4.0)
MCH: 31.3 pg (ref 26.0–34.0)
MCHC: 30.7 g/dL (ref 30.0–36.0)
MCV: 101.8 fL — ABNORMAL HIGH (ref 80.0–100.0)
Monocytes Absolute: 1.4 10*3/uL — ABNORMAL HIGH (ref 0.1–1.0)
Monocytes Relative: 17 %
Neutro Abs: 5.9 10*3/uL (ref 1.7–7.7)
Neutrophils Relative %: 69 %
Platelets: 176 10*3/uL (ref 150–400)
RBC: 2.72 MIL/uL — ABNORMAL LOW (ref 4.22–5.81)
RDW: 16.3 % — ABNORMAL HIGH (ref 11.5–15.5)
WBC: 8.3 10*3/uL (ref 4.0–10.5)
nRBC: 0 % (ref 0.0–0.2)

## 2021-08-09 LAB — RETICULOCYTES
Immature Retic Fract: 26.7 % — ABNORMAL HIGH (ref 2.3–15.9)
RBC.: 2.7 MIL/uL — ABNORMAL LOW (ref 4.22–5.81)
Retic Count, Absolute: 64.8 10*3/uL (ref 19.0–186.0)
Retic Ct Pct: 2.4 % (ref 0.4–3.1)

## 2021-08-09 LAB — IRON AND TIBC
Iron: 20 ug/dL — ABNORMAL LOW (ref 45–182)
Saturation Ratios: 10 % — ABNORMAL LOW (ref 17.9–39.5)
TIBC: 195 ug/dL — ABNORMAL LOW (ref 250–450)
UIBC: 175 ug/dL

## 2021-08-09 LAB — RENAL FUNCTION PANEL
Albumin: 2.3 g/dL — ABNORMAL LOW (ref 3.5–5.0)
Anion gap: 11 (ref 5–15)
BUN: 32 mg/dL — ABNORMAL HIGH (ref 8–23)
CO2: 27 mmol/L (ref 22–32)
Calcium: 7.3 mg/dL — ABNORMAL LOW (ref 8.9–10.3)
Chloride: 94 mmol/L — ABNORMAL LOW (ref 98–111)
Creatinine, Ser: 6.25 mg/dL — ABNORMAL HIGH (ref 0.61–1.24)
GFR, Estimated: 9 mL/min — ABNORMAL LOW (ref >60)
Glucose, Bld: 86 mg/dL (ref 70–99)
Phosphorus: 5 mg/dL — ABNORMAL HIGH (ref 2.5–4.6)
Potassium: 4.8 mmol/L (ref 3.5–5.1)
Sodium: 132 mmol/L — ABNORMAL LOW (ref 135–145)

## 2021-08-09 LAB — FOLATE: Folate: 12.6 ng/mL (ref >5.9)

## 2021-08-09 LAB — VITAMIN B12: Vitamin B-12: 327 pg/mL (ref 180–914)

## 2021-08-09 LAB — FERRITIN: Ferritin: 779 ng/mL — ABNORMAL HIGH (ref 24–336)

## 2021-08-09 MED ORDER — COVID-19MRNA BIVAL VAC MODERNA 50 MCG/0.5ML IM SUSP
0.5000 mL | Freq: Once | INTRAMUSCULAR | Status: AC
  Administered 2021-08-10: 0.5 mL via INTRAMUSCULAR
  Filled 2021-08-09: qty 0.5

## 2021-08-09 NOTE — Progress Notes (Signed)
[facility]  ROUNDING NOTE   Subjective:   Scott Castro is a 66 year old male with a past medical history of Barrett's esophagus, hyperparathyroidism, and end-stage renal disease on dialysis.  Patient reports to the emergency department with complaints of leg pain and swelling.  The patient has been admitted for Limb ischemia [I99.8] Ischemic foot ulcer due to atherosclerosis of native artery of limb ([place]) [I70.25, L97.509]  Patient is known to our practice through previous hospitalizations.  Patient receives outpatient dialysis treatments at [facility], supervised by [facility] physicians.    Patient seen today on first floor Patient main complaint in today visit was that his foot pain/pain at site of amputation is better than before  Objective:  Vital signs in last 24 hours:  Temp:  [97.9 F (36.6 C)-99.8 F (37.7 C)] 98.8 F (37.1 C) (01/01 0853) Pulse Rate:  [109-125] 116 (01/01 0853) Resp:  [12-23] 18 (01/01 0853) BP: (101-139)/(70-85) 101/78 (01/01 0853) SpO2:  [73 %-100 %] 100 % (01/01 0853) Weight:  [74.2 kg] 74.2 kg (12/31 1340)  Weight change: 2.9 kg Filed Weights   08/07/21 1006 08/08/21 0916 08/08/21 1340  Weight: 72.6 kg 75.5 kg 74.2 kg    Intake/Output: I/O last 3 completed shifts: In: -  Out: 1502 [Other:1502]   Intake/Output this shift:  No intake/output data recorded.  Physical Exam: General: NAD, resting on bed  Head: Normocephalic, atraumatic. Moist oral mucosal membranes  Eyes: Anicteric  Neck: Supple, trachea midline  Lungs:  Clear to auscultation, normal effort  Heart: Irregular rate and rhythm  Abdomen:  Soft, nontender  Extremities: No peripheral edema.RLE    BKA  Neurologic: Nonfocal, moving all four extremities     Access: LUE AVG     Basic Metabolic Panel: Recent Labs  Lab 08/05/21 1440 08/05/21 2258 08/06/21 1725 08/08/21 0516 08/09/21 0538  NA 138  --  139 136 132*  K 5.1  --  4.3 5.1 4.8  CL 98  --  98 97* 94*   CO2 28  --  27 27 27   GLUCOSE 98  --  69* 91 86  BUN 40*  --  22 44* 32*  CREATININE 7.58* 7.69* 5.19* 7.64* 6.25*  CALCIUM 8.4*  --  8.4* 7.5* 7.3*  MG  --   --  1.8  --   --   PHOS  --   --  4.7*  --  5.0*    Liver Function Tests: Recent Labs  Lab 08/05/21 1440 08/06/21 1725 08/09/21 0538  AST 14*  --   --   ALT 9  --   --   ALKPHOS 92  --   --   BILITOT 0.7  --   --   PROT 7.6  --   --   ALBUMIN 2.8* 2.7* 2.3*   No results for input(s): LIPASE, AMYLASE in the last 168 hours. No results for input(s): AMMONIA in the last 168 hours.  CBC: Recent Labs  Lab 08/05/21 1440 08/05/21 2258 08/06/21 1225 08/08/21 0516 08/09/21 0538  WBC 7.8 6.8 5.5 7.7 8.3  NEUTROABS 5.9  --  3.3  --  5.9  HGB 10.5* 9.1* 9.1* 8.2* 8.5*  HCT 33.9* 29.4* 29.1* 26.0* 27.7*  MCV 103.0* 102.1* 100.3* 102.4* 101.8*  PLT 283 248 255 199 176    Cardiac Enzymes: No results for input(s): CKTOTAL, CKMB, CKMBINDEX, TROPONINI in the last 168 hours.  BNP: Invalid input(s): POCBNP  CBG: Recent Labs  Lab 08/07/21 2155  GLUCAP  117*    Microbiology: Results for orders placed or performed during the hospital encounter of 08/05/21  Culture, blood (routine x 2)     Status: None (Preliminary result)   Collection Time: 08/05/21  2:40 PM   Specimen: BLOOD  Result Value Ref Range Status   Specimen Description BLOOD RIGHT ANTECUBITAL  Final   Special Requests   Final    BOTTLES DRAWN AEROBIC AND ANAEROBIC Blood Culture results may not be optimal due to an inadequate volume of blood received in culture bottles   Culture   Final    NO GROWTH 4 DAYS Performed at [facility], [address], [place], [place] [address]    Report Status PENDING  Incomplete  Culture, blood (routine x 2)     Status: None (Preliminary result)   Collection Time: 08/05/21  3:30 PM   Specimen: BLOOD  Result Value Ref Range Status   Specimen Description BLOOD BLOOD RIGHT HAND  Final   Special Requests   Final     BOTTLES DRAWN AEROBIC AND ANAEROBIC Blood Culture adequate volume   Culture   Final    NO GROWTH 4 DAYS Performed at [facility], [address], [place], [place] [address]    Report Status PENDING  Incomplete  Resp Panel by RT-PCR (Flu A&B, Covid) Nasopharyngeal Swab     Status: None   Collection Time: 08/05/21  6:36 PM   Specimen: Nasopharyngeal Swab; Nasopharyngeal(NP) swabs in vial transport medium  Result Value Ref Range Status   SARS Coronavirus 2 by RT PCR NEGATIVE NEGATIVE Final    Comment: (NOTE) SARS-CoV-2 target nucleic acids are NOT DETECTED.  The SARS-CoV-2 RNA is generally detectable in upper respiratory specimens during the acute phase of infection. The lowest concentration of SARS-CoV-2 viral copies this assay can detect is 138 copies/mL. A negative result does not preclude SARS-Cov-2 infection and should not be used as the sole basis for treatment or other patient management decisions. A negative result may occur with  improper specimen collection/handling, submission of specimen other than nasopharyngeal swab, presence of viral mutation(s) within the areas targeted by this assay, and inadequate number of viral copies(<138 copies/mL). A negative result must be combined with clinical observations, patient history, and epidemiological information. The expected result is Negative.  Fact Sheet for Patients:  EntrepreneurPulse.com.au  Fact Sheet for Healthcare Providers:  IncredibleEmployment.be  This test is no t yet approved or cleared by the [place] FDA and  has been authorized for detection and/or diagnosis of SARS-CoV-2 by FDA under an Emergency Use Authorization (EUA). This EUA will remain  in effect (meaning this test can be used) for the duration of the COVID-19 declaration under Section 564(b)(1) of the Act, 21 U.S.C.section 360bbb-3(b)(1), unless the authorization is terminated  or revoked sooner.        Influenza A by PCR NEGATIVE NEGATIVE Final   Influenza B by PCR NEGATIVE NEGATIVE Final    Comment: (NOTE) The Xpert Xpress SARS-CoV-2/FLU/RSV plus assay is intended as an aid in the diagnosis of influenza from Nasopharyngeal swab specimens and should not be used as a sole basis for treatment. Nasal washings and aspirates are unacceptable for Xpert Xpress SARS-CoV-2/FLU/RSV testing.  Fact Sheet for Patients: EntrepreneurPulse.com.au  Fact Sheet for Healthcare Providers: IncredibleEmployment.be  This test is not yet approved or cleared by the [place] FDA and has been authorized for detection and/or diagnosis of SARS-CoV-2 by FDA under an Emergency Use Authorization (EUA). This EUA will remain in effect (  meaning this test can be used) for the duration of the COVID-19 declaration under Section 564(b)(1) of the Act, 21 U.S.C. section 360bbb-3(b)(1), unless the authorization is terminated or revoked.  Performed at [facility], [address], [place], [place] [address]   Surgical PCR screen     Status: None   Collection Time: 08/06/21 11:23 PM   Specimen: Nasal Mucosa; Nasal Swab  Result Value Ref Range Status   MRSA, PCR NEGATIVE NEGATIVE Final   Staphylococcus aureus NEGATIVE NEGATIVE Final    Comment: (NOTE) The Xpert SA Assay (FDA approved for NASAL specimens in patients 89 years of age and older), is one component of a comprehensive surveillance program. It is not intended to diagnose infection nor to guide or monitor treatment. Performed at [facility], [address] [place], [place] [address]     Coagulation Studies: No results for input(s): LABPROT, INR in the last 72 hours.   Urinalysis: No results for input(s): COLORURINE, LABSPEC, PHURINE, GLUCOSEU, HGBUR, BILIRUBINUR, KETONESUR, PROTEINUR, UROBILINOGEN, NITRITE, LEUKOCYTESUR in the last 72 hours.  Invalid input(s): APPERANCEUR     Imaging: No results found.   Medications:      apixaban  5 mg Oral BID   atorvastatin  10 mg Oral QHS   cinacalcet  60 mg Oral Q supper   diltiazem  180 mg Oral Daily   epoetin (EPOGEN/PROCRIT) injection  8,000 Units Intravenous Q T,Th,Sa-HD   feeding supplement  237 mL Oral BID BM   gabapentin  100 mg Oral BID   metaxalone  400 mg Oral TID   metoprolol tartrate  12.5 mg Oral BID   mupirocin ointment  1 application Nasal BID   pantoprazole  40 mg Oral Daily   sevelamer carbonate  2,400 mg Oral TID WC   albuterol, HYDROcodone-acetaminophen, HYDROmorphone (DILAUDID) injection  Assessment/ Plan:  Scott Castro is a 66 y.o.  male with a past medical history of Barrett's esophagus, hyperparathyroidism, and end-stage renal disease on dialysis.  Patient reports to the emergency department with complaints of leg pain and swelling.  The patient has been admitted for Limb ischemia [I99.8] Ischemic foot ulcer due to atherosclerosis of native artery of limb ([place]) [I70.25, L97.509]  [facility]/TTS/LUE AVG/71kg  End-stage renal disease on dialysis.  Patient is on Tuesday Thursday Saturday schedule as an outpatient We will maintain outpatient schedule during this admission, if possible.  Patient was last dialyzed on Saturday No need for HD today   2. Anemia of chronic kidney disease  Lab Results  Component Value Date   HGB 8.5 (L) 08/09/2021    Hemoglobin is not at goal Mircera prescribed biweekly outpatient.  Last dose 07/29/2021.  We did  Increase the dose of Epogen  3. Secondary Hyperparathyroidism:  Lab Results  Component Value Date   PTH 93 (H) 04/09/2019   CALCIUM 7.3 (L) 08/09/2021   PHOS 5.0 (H) 08/09/2021  Calcitriol also prescribed outpatient Continue sevelamer with meals  4.  Ischemic foot ulcer.   Patient is now s/p BKA  Vascular team and primary team are following  5.Hyponatremia Sec to pain and ESRD Will follow    LOS: 4 Liane Tribbey s  Almeter Westhoff 1/1/20239:39 AM

## 2021-08-09 NOTE — Plan of Care (Signed)
°  Problem: Clinical Measurements: Goal: Ability to maintain clinical measurements within normal limits will improve Outcome: Progressing   Problem: Education: Goal: Knowledge of General Education information will improve Description: Including pain rating scale, medication(s)/side effects and non-pharmacologic comfort measures Outcome: Not Progressing   Problem: Health Behavior/Discharge Planning: Goal: Ability to manage health-related needs will improve Outcome: Not Progressing

## 2021-08-09 NOTE — Plan of Care (Signed)
  Problem: Clinical Measurements: Goal: Ability to maintain clinical measurements within normal limits will improve Outcome: Progressing   

## 2021-08-09 NOTE — Evaluation (Signed)
Occupational Therapy Evaluation Patient Details Name: Scott Castro MRN: [id number] DOB: [birth year] Today's Date: 08/09/2021   History of Present Illness Pt is a 66 y/o M admitted on 08/05/21. Pt with previous admisson on 12/10-12/14 2/2 to R foot pain & pt had ABI & angioplasty & was discharged home. Pt returned to the ED on 08/05/21 with c/o R foot & leg pain, critical limb ischemia requiring heparinization. Pt underwent R BKA on 08/07/21. 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 Pt was independent in all ADLs prior to surgery. 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 Multiple falls in the last 6 months 2/2 RLE pain and limited balance. Pt is eager to return to PLOF with less pain and improved safety and functional independence. He states that his goal is to get back to work "before my short term disability runs out in six months". Pt currently requires MIN assist for bed mobility, MAX A for LB dressing/ADL management, MOD-MAX +2 for functional mobility, and set-up/supervision assist for UB ADL management due to pain and limited balance. Pt instructed in bed mobility, desensitization strategies for management of residual limb, falls prevention strategies, home/routines modifications, DME/AE for LB bathing and dressing tasks, and considerations for therapy upon acute hospital DC. Pt would benefit from skilled OT services including additional instruction in dressing techniques with or without assistive devices for dressing and bathing skills to support recall and carryover prior to discharge and ultimately to maximize safety, independence, and minimize falls risk and caregiver burden. Recommending CIR to support return to baseline function prior to amputation.        Recommendations for follow up therapy are one  component of a multi-disciplinary discharge planning process, led by the attending physician.  Recommendations may be updated based on patient status, additional functional criteria and insurance authorization.   Follow Up Recommendations  Acute inpatient rehab (3hours/day)    Assistance Recommended at Discharge Intermittent Supervision/Assistance  Functional Status Assessment  Patient has had a recent decline in their functional status and demonstrates the ability to make significant improvements in function in a reasonable and predictable amount of time.  Equipment Recommendations   (Defer to next venue of care.)    Recommendations for Other Services       Precautions / Restrictions Precautions Precautions: Fall Restrictions Weight Bearing Restrictions: Yes RLE Weight Bearing: Non weight bearing Other Position/Activity Restrictions: s/p R BKA      Mobility Bed Mobility Overal bed mobility: Needs Assistance Bed Mobility: Supine to Sit;Sit to Supine     Supine to sit: Min assist;HOB elevated Sit to supine: Min assist;HOB elevated   General bed mobility comments: Pain limited today    Transfers Overall transfer level: Needs assistance Equipment used: Rolling walker (2 wheels) Transfers: Sit to/from Stand (Attempt x2)             General transfer comment: Attempted STS x2 but is unable to come to full stand from bed at elevated height given MAX A. Anticipate +2 assist required for future attempts.      Balance Overall balance assessment: Needs assistance Sitting-balance support: Bilateral upper extremity supported;Feet supported (LLE supported.) Sitting balance-Leahy Scale: Fair Sitting balance - Comments: supervision static sitting EOB     Standing balance-Leahy Scale: Zero  ADL either performed or assessed with clinical judgement   ADL Overall ADL's : Needs assistance/impaired                                        General ADL Comments: Pt is functionally limited by increased pain in his RLE, impaired balance, generalized weakness  (BUE & LLE), and decreased activity tolerance. He requires MIN A for bed mobility. He attempts STS x2 during session with MAX assist is unable to clear hips from EOB. Anticipate would benefit from +2 assist for STS attempts. MOD A for LB dressing/bathing, MOD-MAX A for bed-level peri-care, SET UP for UB ADL management.     Vision Patient Visual Report: No change from baseline       Perception     Praxis      Pertinent Vitals/Pain Pain Assessment: 0-10 Pain Score: 6  Pain Location: R residual limb with mobility Pain Descriptors / Indicators: Discomfort;Grimacing (Phantom pain/sensation) Pain Intervention(s): Limited activity within patient's tolerance;Monitored during session;Repositioned;Utilized relaxation techniques     Hand Dominance     Extremity/Trunk Assessment Upper Extremity Assessment Upper Extremity Assessment: Generalized weakness (BUE grossly weak, unable to push up from EOB for standing. No focal weakness appreciated. Dialysis port LUE.)   Lower Extremity Assessment Lower Extremity Assessment: Defer to PT evaluation;RLE deficits/detail RLE Deficits / Details: s/p RBKA - Endorses phantom limb sensation/pain. Automatcially attempts to move "R foot" out of the way "so it won't get stepped on" aware R foot is no longer present.   Cervical / Trunk Assessment Cervical / Trunk Assessment: Normal   Communication Communication Communication: No difficulties   Cognition Arousal/Alertness: Awake/alert Behavior During Therapy: WFL for tasks assessed/performed Overall Cognitive Status: Within Functional Limits for tasks assessed                                 General Comments: Pt emotiona re: BKA, but states he is coming to terms with his situation. Is able to look at limb today for residual limb care education. Briefly  tearfull, but overall eager to participate in therapy and "get back to my normal life".     General Comments  Pt remaons on 2L [place] t/o session. HR remains in 110's t/o session. Pt asymptomatic. Vitals otherwise stable/WFL.    Exercises Other Exercises Other Exercises: OT facilitates bed/functional mobility attempts with education on compensatory strategies t/o session. Pt/provider also discuss DC options for rehab after acute hospitalization. Pt eager to return to baseline and endorses strong desire to go to acute inpatient rehab upon DC. Pt also educated on residual limb management techniques with extensive time taken to discss residual limb care, desensitization, and timeline for prosthetic use, etc. during session. Pt return verbalizes understanding, Would benefit from further review.   Shoulder Instructions      Home Living Family/patient expects to be discharged to:: Private residence Living Arrangements: Alone Available Help at Discharge: Friend(s);Available PRN/intermittently Type of Home: House Home Access: Stairs to enter [employer] of Steps: 1 Entrance Stairs-Rails: None Home Layout: One level     Bathroom Shower/Tub: Teacher, early years/pre: Standard Bathroom Accessibility: Yes How Accessible: Accessible via walker Home Equipment: None          Prior Functioning/Environment Prior Level of Function : Independent/Modified Independent  Mobility Comments: Independent, 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[employer], driving          OT Problem List: Decreased strength;Decreased coordination;Cardiopulmonary status limiting activity;Pain;Decreased range of motion;Decreased activity tolerance;Decreased safety awareness;Impaired balance (sitting and/or standing);Decreased knowledge of use of DME or AE      OT Treatment/Interventions: Self-care/ADL training;Therapeutic exercise;Therapeutic activities;DME and/or AE instruction;Balance  training;Patient/family education;Energy conservation;Neuromuscular education    OT Goals(Current goals can be found in the care plan section) Acute Rehab OT Goals Patient Stated Goal: To go to rehab so I can be working again in 6 months. OT Goal Formulation: With patient Time For Goal Achievement: 08/23/21 Potential to Achieve Goals: Good ADL Goals Pt Will Perform Lower Body Dressing: with supervision;with set-up;sitting/lateral leans (c LRAD PRN) Pt Will Transfer to Toilet: stand pivot transfer;with supervision;with set-up;bedside commode (c LRAD PRN) Pt Will Perform Toileting - Clothing Manipulation and hygiene: with adaptive equipment;with supervision;with set-up;sit to/from stand (c LRAD PRN)  OT Frequency: Min 5X/week   Barriers to D/C:            Co-evaluation              AM-PAC OT "6 Clicks" Daily Activity     Outcome Measure Help from another person eating meals?: A Little Help from another person taking care of personal grooming?: A Little Help from another person toileting, which includes using toliet, bedpan, or urinal?: A Lot Help from another person bathing (including washing, rinsing, drying)?: A Lot Help from another person to put on and taking off regular upper body clothing?: A Little Help from another person to put on and taking off regular lower body clothing?: A Lot 6 Click Score: 15   End of Session Equipment Utilized During Treatment: Gait belt;Rolling walker (2 wheels);Oxygen  Activity Tolerance: Patient tolerated treatment well Patient left: in bed;with call bell/phone within reach;with bed alarm set  OT Visit Diagnosis: Other abnormalities of gait and mobility (R26.89);Muscle weakness (generalized) (M62.81);Pain Pain - Right/Left: Right Pain - part of body: Knee;Leg;Hip                Time: 5956-3875 OT Time Calculation (min): 56 min Charges:  OT General Charges $OT Visit: 1 Visit OT Evaluation $OT Eval Moderate Complexity: 1 Mod OT  Treatments $Self Care/Home Management : 38-52 mins  Shara Blazing, M.S., OTR/L [employer] [facility] Ascom: [phone] 08/09/21, 11:44 AM

## 2021-08-09 NOTE — Progress Notes (Signed)
PROGRESS NOTE    Scott Castro  NLG:[id number] DOB: 1956/07/17 DOA: 08/05/2021 PCP: Pcp, No    Chief Complaint  Patient presents with   Foot Pain   Leg Swelling    Brief Narrative:  66 year old male with ESRD on TTS , s/p RENAL transplant, sec hyperparathyroidism, Barretts esophagus, PAF on eliquis, admitted for right foot pain, had critical limb ischemia, was seen by vascular surgeon , underwent right Right BKA, . Therapy eval recommending inpatient rehab.   Assessment & Plan:   Principal Problem:   Ischemic foot ulcer due to atherosclerosis of native artery of limb ([place]) Active Problems:   ESRD (end stage renal disease) (HCC)   Hypoalbuminemia due to protein-calorie malnutrition (HCC)   Anemia   PAD (peripheral artery disease) (HCC)   GERD (gastroesophageal reflux disease)   Atrial fibrillation ([place])   RLE critical limb ischemia s/pBKA:  on 12/30.  Pain control.  On gabapentin BID.  Therapy eval recommending CIR.    ESRD on TTS.  Further management as per nephrology.    GERD Stable.    PAF;  RATE controlled. On eliquis for anti coagulation.    Anemia of chronic disease - transfuse to keep hemoglobin greater than 7.    Moderate malnutrition:  Dietary on board.    Multiple lung Nodules:  - outpatient follow up with PCP and annual CT   DVT prophylaxis: eliquis.  Code Status:full code.  Family Communication: none at bedside.  Disposition:   Status is: Inpatient  Remains inpatient appropriate because: inpatient rehab.        Consultants:  Nephrology   Procedures:   Antimicrobials:  Antibiotics Given (last 72 hours)     Date/Time Action Medication Dose   08/07/21 1107 Given   ceFAZolin (ANCEF) IVPB 2g/100 mL premix 2 g         Subjective: No new complaints.    Objective: Vitals:   08/09/21 0853 08/09/21 1144 08/09/21 1605 08/09/21 1717  BP: 101/78  (!) 89/73 109/72  Pulse: (!) 116 (!) 102 (!) 120 (!) 120  Resp: 18  18 18    Temp: 98.8 F (37.1 C)  98.6 F (37 C) 97.8 F (36.6 C)  TempSrc:      SpO2: 100%  97% 100%  Weight:      Height:       No intake or output data in the 24 hours ending 08/09/21 1826 Filed Weights   08/07/21 1006 08/08/21 0916 08/08/21 1340  Weight: 72.6 kg 75.5 kg 74.2 kg    Examination:  General exam: Appears calm and comfortable  Respiratory system: Clear to auscultation. Respiratory effort normal. Cardiovascular system: S1 & S2 heard, irregularly irregular.  Gastrointestinal system: Abdomen is nondistended, soft and nontender.  Normal bowel sounds heard. Central nervous system: Alert and oriented. No focal neurological deficits. Extremities: right BKA Skin: No rashes, lesions or ulcers Psychiatry:  Mood & affect appropriate.     Data Reviewed: I have personally reviewed following labs and imaging studies  CBC: Recent Labs  Lab 08/05/21 1440 08/05/21 2258 08/06/21 1225 08/08/21 0516 08/09/21 0538  WBC 7.8 6.8 5.5 7.7 8.3  NEUTROABS 5.9  --  3.3  --  5.9  HGB 10.5* 9.1* 9.1* 8.2* 8.5*  HCT 33.9* 29.4* 29.1* 26.0* 27.7*  MCV 103.0* 102.1* 100.3* 102.4* 101.8*  PLT 283 248 255 199 081    Basic Metabolic Panel: Recent Labs  Lab 08/05/21 1440 08/05/21 2258 08/06/21 1725 08/08/21 0516 08/09/21 0538  NA 138  --  139 136 132*  K 5.1  --  4.3 5.1 4.8  CL 98  --  98 97* 94*  CO2 28  --  27 27 27   GLUCOSE 98  --  69* 91 86  BUN 40*  --  22 44* 32*  CREATININE 7.58* 7.69* 5.19* 7.64* 6.25*  CALCIUM 8.4*  --  8.4* 7.5* 7.3*  MG  --   --  1.8  --   --   PHOS  --   --  4.7*  --  5.0*    GFR: Estimated Creatinine Clearance: 11.6 mL/min (A) (by C-G formula based on SCr of 6.25 mg/dL (H)).  Liver Function Tests: Recent Labs  Lab 08/05/21 1440 08/06/21 1725 08/09/21 0538  AST 14*  --   --   ALT 9  --   --   ALKPHOS 92  --   --   BILITOT 0.7  --   --   PROT 7.6  --   --   ALBUMIN 2.8* 2.7* 2.3*    CBG: Recent Labs  Lab 08/07/21 2155  GLUCAP 117*      Recent Results (from the past 240 hour(s))  Culture, blood (routine x 2)     Status: None (Preliminary result)   Collection Time: 08/05/21  2:40 PM   Specimen: BLOOD  Result Value Ref Range Status   Specimen Description BLOOD RIGHT ANTECUBITAL  Final   Special Requests   Final    BOTTLES DRAWN AEROBIC AND ANAEROBIC Blood Culture results may not be optimal due to an inadequate volume of blood received in culture bottles   Culture   Final    NO GROWTH 4 DAYS Performed at [facility], [address], [place], [place] [address]    Report Status PENDING  Incomplete  Culture, blood (routine x 2)     Status: None (Preliminary result)   Collection Time: 08/05/21  3:30 PM   Specimen: BLOOD  Result Value Ref Range Status   Specimen Description BLOOD BLOOD RIGHT HAND  Final   Special Requests   Final    BOTTLES DRAWN AEROBIC AND ANAEROBIC Blood Culture adequate volume   Culture   Final    NO GROWTH 4 DAYS Performed at [facility], [address] [place], [place] [address]    Report Status PENDING  Incomplete  Resp Panel by RT-PCR (Flu A&B, Covid) Nasopharyngeal Swab     Status: None   Collection Time: 08/05/21  6:36 PM   Specimen: Nasopharyngeal Swab; Nasopharyngeal(NP) swabs in vial transport medium  Result Value Ref Range Status   SARS Coronavirus 2 by RT PCR NEGATIVE NEGATIVE Final    Comment: (NOTE) SARS-CoV-2 target nucleic acids are NOT DETECTED.  The SARS-CoV-2 RNA is generally detectable in upper respiratory specimens during the acute phase of infection. The lowest concentration of SARS-CoV-2 viral copies this assay can detect is 138 copies/mL. A negative result does not preclude SARS-Cov-2 infection and should not be used as the sole basis for treatment or other patient management decisions. A negative result may occur with  improper specimen collection/handling, submission of specimen other than nasopharyngeal swab, presence of viral mutation(s)  within the areas targeted by this assay, and inadequate number of viral copies(<138 copies/mL). A negative result must be combined with clinical observations, patient history, and epidemiological information. The expected result is Negative.  Fact Sheet for Patients:  EntrepreneurPulse.com.au  Fact Sheet for Healthcare Providers:  IncredibleEmployment.be  This test is no t yet approved or  cleared by the [place] and  has been authorized for detection and/or diagnosis of SARS-CoV-2 by FDA under an Emergency Use Authorization (EUA). This EUA will remain  in effect (meaning this test can be used) for the duration of the COVID-19 declaration under Section 564(b)(1) of the Act, 21 U.S.C.section 360bbb-3(b)(1), unless the authorization is terminated  or revoked sooner.       Influenza A by PCR NEGATIVE NEGATIVE Final   Influenza B by PCR NEGATIVE NEGATIVE Final    Comment: (NOTE) The Xpert Xpress SARS-CoV-2/FLU/RSV plus assay is intended as an aid in the diagnosis of influenza from Nasopharyngeal swab specimens and should not be used as a sole basis for treatment. Nasal washings and aspirates are unacceptable for Xpert Xpress SARS-CoV-2/FLU/RSV testing.  Fact Sheet for Patients: EntrepreneurPulse.com.au  Fact Sheet for Healthcare Providers: IncredibleEmployment.be  This test is not yet approved or cleared by the [place] FDA and has been authorized for detection and/or diagnosis of SARS-CoV-2 by FDA under an Emergency Use Authorization (EUA). This EUA will remain in effect (meaning this test can be used) for the duration of the COVID-19 declaration under Section 564(b)(1) of the Act, 21 U.S.C. section 360bbb-3(b)(1), unless the authorization is terminated or revoked.  Performed at [facility], [address], [place], [place] [address]   Surgical PCR screen     Status: None    Collection Time: 08/06/21 11:23 PM   Specimen: Nasal Mucosa; Nasal Swab  Result Value Ref Range Status   MRSA, PCR NEGATIVE NEGATIVE Final   Staphylococcus aureus NEGATIVE NEGATIVE Final    Comment: (NOTE) The Xpert SA Assay (FDA approved for NASAL specimens in patients 94 years of age and older), is one component of a comprehensive surveillance program. It is not intended to diagnose infection nor to guide or monitor treatment. Performed at [facility], [address], [place], [place] [address]          Radiology Studies: No results found.      Scheduled Meds:  apixaban  5 mg Oral BID   atorvastatin  10 mg Oral QHS   cinacalcet  60 mg Oral Q supper   [START ON 08/10/2021] COVID-19 mRNA bivalent vaccine (Moderna)  0.5 mL Intramuscular ONCE-1600   diltiazem  180 mg Oral Daily   epoetin (EPOGEN/PROCRIT) injection  8,000 Units Intravenous Q T,Th,Sa-HD   feeding supplement  237 mL Oral BID BM   gabapentin  100 mg Oral BID   metaxalone  400 mg Oral TID   metoprolol tartrate  12.5 mg Oral BID   mupirocin ointment  1 application Nasal BID   pantoprazole  40 mg Oral Daily   sevelamer carbonate  2,400 mg Oral TID WC   Continuous Infusions:   LOS: 4 days        Hosie Poisson, MD [facility]   To contact the attending provider between 7A-7P or the covering provider during after hours 7P-7A, please log into the web site [email or web address] and access using universal [place] password for that web site. If you do not have the password, please call the hospital operator.  08/09/2021, 6:26 PM

## 2021-08-09 NOTE — Progress Notes (Signed)
Physical Therapy Treatment Patient Details Name: Scott Castro MRN: [id number] DOB: 1956-02-07 Today's Date: 08/09/2021   History of Present Illness Pt is a 66 y/o M admitted on 08/05/21. Pt with previous admisson on 12/10-12/14 2/2 to R foot pain & pt had ABI & angioplasty & was discharged home. Pt returned to the ED on 08/05/21 with c/o R foot & leg pain, critical limb ischemia requiring heparinization. Pt underwent R BKA on 08/07/21. PMH: ESRD TTS 2/2 FSGS, renal transplant 2/2 hyperparathyroidism, Barrett's esophagus, paroxysmal a-fib on eliquis, PAD    PT Comments    Pt resting in bed upon PT arrival; agreeable to PT session.  Min assist with bed mobility; unable to stand up to RW with 1 assist; and able to laterally scoot L/R sitting edge of bed with min assist and cueing for technique.  Pt motivated to participate in therapy but mobility limited d/t generalized weakness and R residual limb pain with movement.  Pain 3-4/10 R residual limb end of session at rest (increased to 6-8/10 briefly with activity during session).  Will continue to focus on overall strengthening, R knee ROM, and progressive functional mobility during hospitalization.  Acute inpatient rehab recommendation remains appropriate.    Recommendations for follow up therapy are one component of a multi-disciplinary discharge planning process, led by the attending physician.  Recommendations may be updated based on patient status, additional functional criteria and insurance authorization.  Follow Up Recommendations  Acute inpatient rehab (3hours/day)     Assistance Recommended at Discharge Frequent or constant Supervision/Assistance  Equipment Recommendations  None recommended by PT (TBD at next venue of care)    Recommendations for Other Services       Precautions / Restrictions Precautions Precautions: Fall Restrictions Weight Bearing Restrictions: Yes RLE Weight Bearing: Non weight bearing Other Position/Activity  Restrictions: s/p R BKA     Mobility  Bed Mobility Overal bed mobility: Needs Assistance Bed Mobility: Supine to Sit     Supine to sit: Min assist;HOB elevated Sit to supine: Min assist;HOB elevated   General bed mobility comments: assist for R residual limb; increased effort/time to perform on own    Transfers Overall transfer level: Needs assistance Equipment used: Rolling walker (2 wheels) Transfers: Sit to/from Stand;Bed to chair/wheelchair/BSC Sit to Stand: Total assist          Lateral/Scoot Transfers: Min assist General transfer comment: unable to clear pt's bottom from bed attempting to stand up to RW x1 trial; laterally scooting to L (x3 trials) then to R (x3 trials) and then again to L (x3 trials); vc's required for positioning and overall technique with transfers    Ambulation/Gait               General Gait Details: not appropriate at this time   Stairs             Wheelchair Mobility    Modified Rankin (Stroke Patients Only)       Balance Overall balance assessment: Needs assistance Sitting-balance support: Single extremity supported (L LE supported) Sitting balance-Leahy Scale: Fair Sitting balance - Comments: steady static sitting     Standing balance-Leahy Scale: Zero Standing balance comment: unable to assess at this time                            Cognition Arousal/Alertness: Awake/alert Behavior During Therapy: WFL for tasks assessed/performed Overall Cognitive Status: Within Functional Limits for tasks assessed  General Comments: Pt emotional regarding BKA and wondering if it could have been prevented.        Exercises Total Joint Exercises Ankle Circles/Pumps: AROM;Strengthening;Left;10 reps;Supine Quad Sets: AROM;Strengthening;Right;10 reps;Supine Heel Slides: AROM;Strengthening;Left;10 reps;Supine Knee Flexion: AROM;Strengthening;Right;10 reps;Supine (minimal  R knee flexion noted d/t R residual limb pain)    General Comments       Pertinent Vitals/Pain Pain Assessment: 0-10 Pain Score: 4  Pain Location: R residual limb with mobility Pain Descriptors / Indicators: Discomfort;Grimacing Pain Intervention(s): Limited activity within patient's tolerance;Monitored during session;Repositioned;Patient requesting pain meds-RN notified HR 102-112 bpm during session.  O2 sats 98% on 2 L O2 via nasal cannula beginning of session.  Unable to get O2 reading end of session (on 2 different pulse-oximeters)--nurse notified.    Home Living        Prior Function            PT Goals (current goals can now be found in the care plan section) Acute Rehab PT Goals Patient Stated Goal: improve pain and mobility PT Goal Formulation: With patient Time For Goal Achievement: 08/21/21 Potential to Achieve Goals: Good Progress towards PT goals: Progressing toward goals    Frequency    7X/week      PT Plan Current plan remains appropriate    Co-evaluation              AM-PAC PT "6 Clicks" Mobility   Outcome Measure  Help needed turning from your back to your side while in a flat bed without using bedrails?: A Little Help needed moving from lying on your back to sitting on the side of a flat bed without using bedrails?: A Little Help needed moving to and from a bed to a chair (including a wheelchair)?: A Lot Help needed standing up from a chair using your arms (e.g., wheelchair or bedside chair)?: Total Help needed to walk in hospital room?: Total Help needed climbing 3-5 steps with a railing? : Total 6 Click Score: 11    End of Session Equipment Utilized During Treatment: Oxygen Activity Tolerance: Patient tolerated treatment well Patient left: in bed;with call bell/phone within reach;with bed alarm set;with nursing/sitter in room Nurse Communication: Mobility status;Precautions;Patient requests pain meds PT Visit Diagnosis: Unsteadiness  on feet (R26.81);Difficulty in walking, not elsewhere classified (R26.2);Muscle weakness (generalized) (M62.81);Other abnormalities of gait and mobility (R26.89)     Time: 0947-0962 PT Time Calculation (min) (ACUTE ONLY): 42 min  Charges:  $Therapeutic Exercise: 8-22 mins $Therapeutic Activity: 23-37 mins                    Leitha Bleak, PT 08/09/21, 2:15 PM

## 2021-08-09 NOTE — Progress Notes (Addendum)
° ° °  Subjective  - POD #2, status post right below-knee amputation  Still having some discomfort at the amputation site   Physical Exam:  Dressing was changed today.  His amputation is healing appropriately.  The incision is intact without erythema or drainage.       Assessment/Plan:  POD #2  He is recovering appropriately from a right below-knee amputation.  The incision site looks good.  Continue to work with PT.  I went over some leg straightening exercises for him to do while he is in the bed.  Wells Jae Skeet 08/09/2021 8:09 PM --  Vitals:   08/09/21 1605 08/09/21 1717  BP: (!) 89/73 109/72  Pulse: (!) 120 (!) 120  Resp: 18 18  Temp: 98.6 F (37 C) 97.8 F (36.6 C)  SpO2: 97% 100%   No intake or output data in the 24 hours ending 08/09/21 2009   Laboratory CBC    Component Value Date/Time   WBC 8.3 08/09/2021 0538   HGB 8.5 (L) 08/09/2021 0538   HGB 11.4 (L) 06/11/2012 0501   HCT 27.7 (L) 08/09/2021 0538   HCT 34.6 (L) 06/11/2012 0501   PLT 176 08/09/2021 0538   PLT 251 06/11/2012 0501    BMET    Component Value Date/Time   NA 132 (L) 08/09/2021 0538   NA 142 06/10/2012 0924   K 4.8 08/09/2021 0538   K 4.5 06/16/2013 1315   CL 94 (L) 08/09/2021 0538   CL 102 06/10/2012 0924   CO2 27 08/09/2021 0538   CO2 29 06/10/2012 0924   GLUCOSE 86 08/09/2021 0538   GLUCOSE 83 06/10/2012 0924   BUN 32 (H) 08/09/2021 0538   BUN 35 (H) 06/10/2012 0924   CREATININE 6.25 (H) 08/09/2021 0538   CREATININE 10.29 (H) 06/10/2012 0924   CALCIUM 7.3 (L) 08/09/2021 0538   CALCIUM 9.2 06/10/2012 0924   GFRNONAA 9 (L) 08/09/2021 0538   GFRNONAA 5 (L) 06/10/2012 0924   GFRAA 9 (L) 09/18/2019 0430   GFRAA 6 (L) 06/10/2012 0924    COAG Lab Results  Component Value Date   INR 1.7 (H) 08/05/2021   INR 1.1 07/18/2021   INR 1.2 07/03/2021   No results found for: PTT  Antibiotics Anti-infectives (From admission, onward)    Start     Dose/Rate Route Frequency  Ordered Stop   08/07/21 1011  ceFAZolin (ANCEF) 2-4 GM/100ML-% IVPB       Note to Pharmacy: [place] [facility], [place]: cabinet override      08/07/21 1011 08/07/21 1111   08/06/21 1934  ceFAZolin (ANCEF) IVPB 2g/100 mL premix        2 g 200 mL/hr over 30 Minutes Intravenous 30 min pre-op 08/06/21 1934 08/07/21 1107   08/06/21 1530  ceFAZolin (ANCEF) IVPB 1 g/50 mL premix        1 g 100 mL/hr over 30 Minutes Intravenous  Once 08/06/21 1524 08/06/21 1737   08/06/21 1215  ceFAZolin (ANCEF) IVPB 2g/100 mL premix  Status:  Discontinued        2 g 200 mL/hr over 30 Minutes Intravenous 30 min pre-op 08/06/21 1215 08/06/21 1522        V. Leia Alf, M.D., [facility] [place] Office: [phone] Pager:  [phone]

## 2021-08-10 DIAGNOSIS — I4891 Unspecified atrial fibrillation: Secondary | ICD-10-CM | POA: Diagnosis not present

## 2021-08-10 DIAGNOSIS — I7025 Atherosclerosis of native arteries of other extremities with ulceration: Secondary | ICD-10-CM | POA: Diagnosis not present

## 2021-08-10 DIAGNOSIS — N186 End stage renal disease: Secondary | ICD-10-CM | POA: Diagnosis not present

## 2021-08-10 DIAGNOSIS — I998 Other disorder of circulatory system: Secondary | ICD-10-CM | POA: Diagnosis not present

## 2021-08-10 MED ORDER — POLYETHYLENE GLYCOL 3350 17 G PO PACK
17.0000 g | PACK | Freq: Two times a day (BID) | ORAL | Status: DC
  Administered 2021-08-10 – 2021-08-11 (×4): 17 g via ORAL
  Filled 2021-08-10 (×6): qty 1

## 2021-08-10 MED ORDER — METHOCARBAMOL 500 MG PO TABS
500.0000 mg | ORAL_TABLET | Freq: Three times a day (TID) | ORAL | Status: DC | PRN
  Administered 2021-08-12: 500 mg via ORAL
  Filled 2021-08-10: qty 1

## 2021-08-10 MED ORDER — SENNOSIDES-DOCUSATE SODIUM 8.6-50 MG PO TABS
2.0000 | ORAL_TABLET | Freq: Two times a day (BID) | ORAL | Status: DC
  Administered 2021-08-10 – 2021-08-12 (×5): 2 via ORAL
  Filled 2021-08-10 (×5): qty 2

## 2021-08-10 NOTE — Progress Notes (Signed)
Physical Therapy Treatment Patient Details Name: Scott Castro MRN: [id number] DOB: 02-25-1956 Today's Date: 08/10/2021   History of Present Illness Pt is a 66 y/o M admitted on 08/05/21. Pt with previous admisson on 12/10-12/14 2/2 to R foot pain & pt had ABI & angioplasty & was discharged home. Pt returned to the ED on 08/05/21 with c/o R foot & leg pain, critical limb ischemia requiring heparinization. Pt underwent R BKA on 08/07/21. PMH: ESRD TTS 2/2 FSGS, renal transplant 2/2 hyperparathyroidism, Barrett's esophagus, paroxysmal a-fib on eliquis, PAD    PT Comments    Pt received in bed, agreeable to PT session. Completion of B LE strengthening/ROM. Pt educated on proper positioning of R BKA for contracture management and desensitization techniques. Pt able to attain upright standing at RW today after 2 attempts and MaxA. Pivot to bedside chair with Mod/MaxA due to decreased safety awareness and increased fall risk. 5/10 distal stump pain with ROM. Pt very motivated to transition to CIR once medically cleared for d/c. Continue PT per POC.   Recommendations for follow up therapy are one component of a multi-disciplinary discharge planning process, led by the attending physician.  Recommendations may be updated based on patient status, additional functional criteria and insurance authorization.  Follow Up Recommendations  Acute inpatient rehab (3hours/day)     Assistance Recommended at Discharge Frequent or constant Supervision/Assistance  Equipment Recommendations  None recommended by PT    Recommendations for Other Services       Precautions / Restrictions Precautions Precautions: Fall Restrictions Weight Bearing Restrictions: Yes RLE Weight Bearing: Non weight bearing Other Position/Activity Restrictions: s/p R BKA     Mobility  Bed Mobility Overal bed mobility: Needs Assistance Bed Mobility: Supine to Sit     Supine to sit: Min guard (use of railing and increased time)           Transfers Overall transfer level: Needs assistance Equipment used: Rolling walker (2 wheels) Transfers: Sit to/from Stand Sit to Stand: Max assist Stand pivot transfers: Max assist         General transfer comment: Pt able to attain full upright standing posture at RW.    Ambulation/Gait                   Stairs             Wheelchair Mobility    Modified Rankin (Stroke Patients Only)       Balance                                            Cognition Arousal/Alertness: Awake/alert Behavior During Therapy: WFL for tasks assessed/performed Overall Cognitive Status: Within Functional Limits for tasks assessed                                 General Comments: Pt feeling a little more optomistic today        Exercises Total Joint Exercises Ankle Circles/Pumps: AROM;Strengthening;Left;10 reps;Supine Quad Sets: AROM;Strengthening;Right;10 reps;Supine Heel Slides: AROM;Strengthening;Left;10 reps;Supine Knee Flexion: AROM;Strengthening;Right;10 reps;Supine Amputee Exercises Chair Push Up: AROM;10 reps;Seated Other Exercises Other Exercises: R distal stump desensitization techniques    General Comments General comments (skin integrity, edema, etc.): Pt educated on importance of positiong R LE to reduce contractures. Pt stated he couldn't straighten his R knee prior to  BKA.      Pertinent Vitals/Pain Pain Assessment: 0-10 Pain Score: 5  Pain Location: R residual limb with mobility Pain Descriptors / Indicators: Discomfort;Grimacing Pain Intervention(s): Monitored during session    Home Living   Living Arrangements: Alone Available Help at Discharge: Friend(s);Neighbor;Available PRN/intermittently Type of Home: House Home Access: Stairs to enter Entrance Stairs-Rails: None Entrance Stairs-Number of Steps: 1   Home Layout: One level        Prior Function            PT Goals (current goals can  now be found in the care plan section) Acute Rehab PT Goals Patient Stated Goal: go to acute rehab Progress towards PT goals: Progressing toward goals    Frequency    7X/week      PT Plan Current plan remains appropriate    Co-evaluation              AM-PAC PT "6 Clicks" Mobility   Outcome Measure  Help needed turning from your back to your side while in a flat bed without using bedrails?: A Little Help needed moving from lying on your back to sitting on the side of a flat bed without using bedrails?: A Little Help needed moving to and from a bed to a chair (including a wheelchair)?: A Lot Help needed standing up from a chair using your arms (e.g., wheelchair or bedside chair)?: Total Help needed to walk in hospital room?: Total Help needed climbing 3-5 steps with a railing? : Total 6 Click Score: 11    End of Session Equipment Utilized During Treatment: Oxygen Activity Tolerance: Patient tolerated treatment well Patient left: in chair;with call bell/phone within reach Nurse Communication: Mobility status PT Visit Diagnosis: Unsteadiness on feet (R26.81);Difficulty in walking, not elsewhere classified (R26.2);Muscle weakness (generalized) (M62.81);Other abnormalities of gait and mobility (R26.89)     Time: 1210-1240 PT Time Calculation (min) (ACUTE ONLY): 30 min  Charges:  $Therapeutic Exercise: 8-22 mins $Therapeutic Activity: 8-22 mins                    Mikel Cella, PTA    Scott Castro 08/10/2021, 1:02 PM

## 2021-08-10 NOTE — TOC Progression Note (Signed)
Transition of Care [facility]) - Progression Note    Patient Details  Name: Scott Castro MRN: [id number] Date of Birth: [birth year]  Transition of Care [facility]) CM/SW [place], RN Phone Number: 08/10/2021, 10:12 AM  Clinical Narrative:   Reached out to Lauren at [facility] asking for status of their review, awaiting a response     Expected Discharge Plan: Early Barriers to Discharge: Continued Medical Work up  Expected Discharge Plan and Services Expected Discharge Plan: [place] arrangements for the past 2 months: Single Family Home                                       Social Determinants of Health (SDOH) Interventions    Readmission Risk Interventions Readmission Risk Prevention Plan 07/22/2021 04/10/2019  Transportation Screening Complete Complete  PCP or Specialist Appt within 3-5 Days Complete Complete  HRI or [place] Complete Complete  Social Work Consult for [place] Planning/Counseling Complete Complete  Palliative Care Screening Not Applicable -  Medication Review Press photographer) Complete Complete  Some recent data might be hidden

## 2021-08-10 NOTE — PMR Pre-admission (Signed)
PMR Admission Coordinator Pre-Admission Assessment  Patient: Scott Castro is an 66 y.o., male MRN: [id number] DOB: 1956-04-07 Height: 5\' 9"  (175.3 cm) Weight: 74.2 kg  Insurance Information HMO:     PPO:      PCP:      IPA:      80/20: yes     OTHER:  PRIMARY: Medicare A & B      Policy#: [id number]      Subscriber: patient CM Name:       Phone#:      Fax#:  Pre-Cert#:       Employer:  Benefits:  Phone #: verified eligibility via [employer] on 08/10/21     Name:  Eff. Date: Part A & B effective 06/09/02     Deduct: $1,600      Out of Pocket Max: NA      Life Max: NA CIR: 100% coverage      SNF: 100% days 1-20, 80% days 21-100 Outpatient: 80% coverage     Co-Pay: 20%  Home Health: 100% coverage      Co-Pay:  DME: 80% coverage     Co-Pay:  Providers: pt's choice SECONDARY: BCBS COMM PPO      Policy#: [id number]     Phone#: [phone]  Financial Counselor:       Phone#:   The Data Collection Information Summary for patients in Inpatient Rehabilitation Facilities with attached Privacy Act [place] Records was provided and verbally reviewed with: Patient  Emergency Contact Information Contact Information     Name Relation Home Work Mobile   cox,kathy Relative   [phone]       Current Medical History  Patient Admitting Diagnosis: ischemic foot ulcer s/p R BKA History of Present Illness: Pt is a 66 year old male with medical hx significant for: PAD, ESRD on HD T,T,S, COPD on 2-3L [place], Barrett's esophagus, h/o multiple lung nodules, paroxysmal A-fib, ABI and angioplasty to right anterior tibial artery, tibial peroneal trunk and popliteal artery (07/20/21).   On 08/05/21, pt presented to [facility] d/t right foot pain and leg swelling. Pt  had worsening redness, blisters on right foot and toes. Venus ultrasound of RLE negative for DVT and x-ray of foot shows severe soft tissue swelling. Vascular surgery performed angioplasty and PTCA right tibial  peroneal trunk and right external iliac stenting on 08/06/21. Pt had right BKA on 08/07/21. Therapy evaluations completed and CIR recommended d/t pt's deficits in funtional mobility and ability to complete ADLs independently.     Patient's medical record from [facility] has been reviewed by the rehabilitation admission coordinator and physician.  Past Medical History  Past Medical History:  Diagnosis Date   Barrett's esophagus    Chronic kidney disease    CHRONIC\   Glomerulosclerosis, focal    Hyperparathyroidism due to renal insufficiency (HCC)     Has the patient had major surgery during 100 days prior to admission? Yes  Family History   family history includes Heart disease in his father and mother.  Current Medications  Current Facility-Administered Medications:    albuterol (PROVENTIL) (2.5 MG/3ML) 0.083% nebulizer solution 3 mL, 3 mL, Inhalation, Q6H PRN, Dew, Erskine Squibb, MD   apixaban (ELIQUIS) tablet 5 mg, 5 mg, Oral, BID, Dew, Erskine Squibb, MD, 5 mg at 08/10/21 1026   atorvastatin (LIPITOR) tablet 10 mg, 10 mg, Oral, QHS, Dew, Erskine Squibb, MD, 10 mg at 08/09/21 2102   cinacalcet (SENSIPAR) tablet 60 mg, 60 mg, Oral,  Q supper, Algernon Huxley, MD, 60 mg at 08/09/21 1725   COVID-19 mRNA bivalent vaccine (Moderna) injection 0.5 mL, 0.5 mL, Intramuscular, ONCE-1600, Karleen Hampshire, Vijaya, MD   diltiazem (CARDIZEM CD) 24 hr capsule 180 mg, 180 mg, Oral, Daily, Dew, Erskine Squibb, MD, 180 mg at 08/10/21 1028   epoetin alfa (EPOGEN) injection 8,000 Units, 8,000 Units, Intravenous, Q T,Th,Sa-HD, Bhutani, Manpreet S, MD, 8,000 Units at 08/08/21 1227   feeding supplement (ENSURE ENLIVE / ENSURE PLUS) liquid 237 mL, 237 mL, Oral, BID BM, Algernon Huxley, MD, 237 mL at 08/10/21 1029   gabapentin (NEURONTIN) capsule 100 mg, 100 mg, Oral, BID, Samtani, Jai-Gurmukh, MD, 100 mg at 08/10/21 1026   HYDROcodone-acetaminophen (NORCO) 7.5-325 MG per tablet 1 tablet, 1 tablet, Oral, TID PRN, Algernon Huxley,  MD, 1 tablet at 08/09/21 1725   HYDROmorphone (DILAUDID) injection 1 mg, 1 mg, Intravenous, Q4H PRN, Nita Sells, MD, 1 mg at 08/10/21 0214   metaxalone (SKELAXIN) tablet 400 mg, 400 mg, Oral, TID, Verlon Au, Jai-Gurmukh, MD, 400 mg at 08/10/21 1027   methocarbamol (ROBAXIN) tablet 500 mg, 500 mg, Oral, Q8H PRN, Hosie Poisson, MD   metoprolol tartrate (LOPRESSOR) tablet 12.5 mg, 12.5 mg, Oral, BID, Samtani, Jai-Gurmukh, MD, 12.5 mg at 08/10/21 1026   mupirocin ointment (BACTROBAN) 2 % 1 application, 1 application, Nasal, BID, Dew, Erskine Squibb, MD, 1 application at 62/70/35 1030   pantoprazole (PROTONIX) EC tablet 40 mg, 40 mg, Oral, Daily, Dew, Erskine Squibb, MD, 40 mg at 08/10/21 1026   sevelamer carbonate (RENVELA) tablet 2,400 mg, 2,400 mg, Oral, TID WC, Algernon Huxley, MD, 2,400 mg at 08/10/21 0093  Patients Current Diet:  Diet Order             Diet renal with fluid restriction Fluid restriction: 1200 mL Fluid; Room service appropriate? Yes; Fluid consistency: Thin  Diet effective now                   Precautions / Restrictions Precautions Precautions: Fall Restrictions Weight Bearing Restrictions: Yes RLE Weight Bearing: Non weight bearing Other Position/Activity Restrictions: s/p R BKA   Has the patient had 2 or more falls or a fall with injury in the past year? Yes  Prior Activity Level Community (5-7x/wk): works full-time, drives, gets out of house daily  Prior Functional Level Self Care: Did the patient need help bathing, dressing, using the toilet or eating? Independent  Indoor Mobility: Did the patient need assistance with walking from room to room (with or without device)? Independent  Stairs: Did the patient need assistance with internal or external stairs (with or without device)? Independent  Functional Cognition: Did the patient need help planning regular tasks such as shopping or remembering to take medications? Independent  Patient Information Are you of  Hispanic, Latino/a,or Spanish origin?: A. No, not of Hispanic, Latino/a, or Spanish origin What is your race?: A. White Do you need or want an interpreter to communicate with a doctor or health care staff?: 0. No  Patient's Response To:  Health Literacy and Transportation Is the patient able to respond to health literacy and transportation needs?: Yes Health Literacy - How often do you need to have someone help you when you read instructions, pamphlets, or other written material from your doctor or pharmacy?: Never In the past 12 months, has lack of transportation kept you from medical appointments or from getting medications?: No In the past 12 months, has lack of transportation kept you from meetings, work, or  from getting things needed for daily living?: No  Home Assistive Devices / Equipment Home Assistive Devices/Equipment: Eyeglasses, Environmental consultant (specify type) Wellsite geologist) Home Equipment: None  Prior Device Use: Indicate devices/aids used by the patient prior to current illness, exacerbation or injury? None of the above  Current Functional Level Cognition  Overall Cognitive Status: Within Functional Limits for tasks assessed Orientation Level: Oriented X4 General Comments: Pt emotional regarding BKA and wondering if it could have been prevented.    Extremity Assessment (includes Sensation/Coordination)  Upper Extremity Assessment: Generalized weakness (BUE grossly weak, unable to push up from EOB for standing. No focal weakness appreciated. Dialysis port LUE.)  Lower Extremity Assessment: Defer to PT evaluation, RLE deficits/detail RLE Deficits / Details: s/p RBKA - Endorses phantom limb sensation/pain. Automatcially attempts to move "R foot" out of the way "so it won't get stepped on" aware R foot is no longer present.    ADLs  Overall ADL's : Needs assistance/impaired General ADL Comments: Pt is functionally limited by increased pain in his RLE, impaired balance,  generalized weakness  (BUE & LLE), and decreased activity tolerance. He requires MIN A for bed mobility. He attempts STS x2 during session with MAX assist is unable to clear hips from EOB. Anticipate would benefit from +2 assist for STS attempts. MOD A for LB dressing/bathing, MOD-MAX A for bed-level peri-care, SET UP for UB ADL management.    Mobility  Overal bed mobility: Needs Assistance Bed Mobility: Supine to Sit Supine to sit: Min assist, HOB elevated Sit to supine: Min assist, HOB elevated General bed mobility comments: assist for R residual limb; increased effort/time to perform on own    Transfers  Overall transfer level: Needs assistance Equipment used: Rolling walker (2 wheels) Transfers: Sit to/from Stand, Bed to chair/wheelchair/BSC Sit to Stand: Total assist Bed to/from chair/wheelchair/BSC transfer type:: Lateral/scoot transfer  Lateral/Scoot Transfers: Min assist General transfer comment: unable to clear pt's bottom from bed attempting to stand up to RW x1 trial; laterally scooting to L (x3 trials) then to R (x3 trials) and then again to L (x3 trials); vc's required for positioning and overall technique with transfers    Ambulation / Gait / Stairs / Wheelchair Mobility  Ambulation/Gait General Gait Details: not appropriate at this time    Posture / Balance Dynamic Sitting Balance Sitting balance - Comments: steady static sitting Balance Overall balance assessment: Needs assistance Sitting-balance support: Single extremity supported (L LE supported) Sitting balance-Leahy Scale: Fair Sitting balance - Comments: steady static sitting Standing balance-Leahy Scale: Zero Standing balance comment: unable to assess at this time    Special needs/care consideration Dialysis: Hemodialysis Tuesday, Thursday, and Saturday, Oxygen 4L nasal cannula, and Skin Amputation: leg/right; Ecchymosis: arm/bilateral; Surgical incision: leg/right   Previous Home Environment (from acute  therapy documentation) Living Arrangements: Alone Available Help at Discharge: Friend(s), Neighbor, Available PRN/intermittently Type of Home: House Home Layout: One level Home Access: Stairs to enter Entrance Stairs-Rails: None Entrance Stairs-Number of Steps: 1 Bathroom Shower/Tub: Optometrist: Yes How Accessible: Accessible via walker [facility]: Yes Type of Home Care Services: Home RN, [place] (if known): [facility]  Discharge Living Setting Plans for Discharge Living Setting: Patient's home Type of Home at Discharge: House Discharge Home Layout: One level Discharge Home Access: Stairs to enter Entrance Stairs-Rails: None Entrance Stairs-Number of Steps: 1 Discharge Bathroom Shower/Tub: Tub/shower unit Discharge Bathroom Toilet: Standard Discharge Bathroom Accessibility: Yes How Accessible: Accessible via  walker Does the patient have any problems obtaining your medications?: No  Social/Family/Support Systems Anticipated Caregiver: Red Christians, friend; neighbors and other friends Anticipated Caregiver's Contact Information: Thayer Jew: [phone] Caregiver Availability: Intermittent Discharge Plan Discussed with Primary Caregiver: Yes Is Caregiver In Agreement with Plan?: Yes Does Caregiver/Family have Issues with Lodging/Transportation while Pt is in Rehab?: No  Goals Patient/Family Goal for Rehab: PT/OT Mod I  Expected length of stay: 12-14 days Pt/Family Agrees to Admission and willing to participate: Yes Program Orientation Provided & Reviewed with Pt/Caregiver Including Roles  & Responsibilities: Yes  Decrease burden of Care through IP rehab admission: NA  Possible need for SNF placement upon discharge: Not anticipated  Patient Condition: I have reviewed medical records from [facility], spoken with CM, and patient. I discussed via phone for inpatient  rehabilitation assessment.  Patient will benefit from ongoing PT and OT, can actively participate in 3 hours of therapy a day 5 days of the week, and can make measurable gains during the admission.  Patient will also benefit from the coordinated team approach during an Inpatient Acute Rehabilitation admission.  The patient will receive intensive therapy as well as Rehabilitation physician, nursing, social worker, and care management interventions.  Due to safety, skin/wound care, disease management, medication administration, pain management, and patient education the patient requires 24 hour a day rehabilitation nursing.  The patient is currently min-mod A  with mobility and basic ADLs.  Discharge setting and therapy post discharge at home with home health is anticipated.  Patient has agreed to participate in the Acute Inpatient Rehabilitation Program and will admit today.  Preadmission Screen Completed By:  Bethel Born, 08/10/2021 10:55 AM ______________________________________________________________________   Discussed status with Dr. Ranell Patrick  on 08/12/20 at 28 and received approval for admission today.  Admission Coordinator:  Bethel Born, CCC-SLP, time 1030/Date 08/12/20   Assessment/Plan: Diagnosis: RLE BKA Does the need for close, 24 hr/day Medical supervision in concert with the patient's rehab needs make it unreasonable for this patient to be served in a less intensive setting? Yes Co-Morbidities requiring supervision/potential complications: HLD, hyponatremia, HTN, multiple lung nodules, PAF Due to bladder management, bowel management, safety, skin/wound care, disease management, medication administration, pain management, and patient education, does the patient require 24 hr/day rehab nursing? Yes Does the patient require coordinated care of a physician, rehab nurse, PT, OT to address physical and functional deficits in the context of the above medical diagnosis(es)?  Yes Addressing deficits in the following areas: balance, endurance, locomotion, strength, transferring, bowel/bladder control, bathing, dressing, feeding, grooming, toileting, and psychosocial support Can the patient actively participate in an intensive therapy program of at least 3 hrs of therapy 5 days a week? Yes The potential for patient to make measurable gains while on inpatient rehab is excellent Anticipated functional outcomes upon discharge from inpatient rehab: supervision PT, supervision OT, independent SLP Estimated rehab length of stay to reach the above functional goals is: 10-14 days Anticipated discharge destination: Home 10. Overall Rehab/Functional Prognosis: excellent   MD Signature: Leeroy Cha, MD

## 2021-08-10 NOTE — Progress Notes (Signed)
PROGRESS NOTE    Scott Castro  HEN:[id number] DOB: May 05, 1956 DOA: 08/05/2021 PCP: Pcp, No    Chief Complaint  Patient presents with   Foot Pain   Leg Swelling    Brief Narrative:  66 year old male with ESRD on TTS , s/p RENAL transplant, sec hyperparathyroidism, Barretts esophagus, PAF on eliquis, admitted for right foot pain, had critical limb ischemia, was seen by vascular surgeon , underwent right Right BKA, . Therapy eval recommending inpatient rehab.   Assessment & Plan:   Principal Problem:   Ischemic foot ulcer due to atherosclerosis of native artery of limb ([place]) Active Problems:   ESRD (end stage renal disease) (HCC)   Hypoalbuminemia due to protein-calorie malnutrition (HCC)   Anemia   PAD (peripheral artery disease) (HCC)   GERD (gastroesophageal reflux disease)   Atrial fibrillation ([place])   RLE critical limb ischemia s/pBKA:  on 12/30. Incision site looking good.  Pain control.  On gabapentin BID. Added robaxin for muscle spasms Therapy eval recommending CIR.    ESRD on TTS.  Further management as per nephrology.    GERD Stable on PROTONIX.    PAF;  Rate controlled with cardizem and metoprolol 12.5 mg BID.  On eliquis for anti coagulation.    Anemia of chronic disease - transfuse to keep hemoglobin greater than 7.  Continue with epogen on the days of HD.  Continue with Renvela.    Moderate malnutrition:  Dietary on board.    Multiple lung Nodules:  - outpatient follow up with PCP and annual CT  Hyperlipidemia:  Resume lipitor.    DVT prophylaxis: eliquis.  Code Status:full code.  Family Communication: none at bedside.  Disposition:   Status is: Inpatient  Remains inpatient appropriate because: inpatient rehab.        Consultants:  Nephrology Vascular surgery.   Procedures:  s/pBKA:  on 12/30.  Antimicrobials:  Antibiotics Given (last 72 hours)     Date/Time Action Medication Dose   08/07/21 1107 Given   ceFAZolin  (ANCEF) IVPB 2g/100 mL premix 2 g         Subjective: Reports muscle spasms.    Objective: Vitals:   08/09/21 1605 08/09/21 1717 08/09/21 2043 08/10/21 0738  BP: (!) 89/73 109/72 100/74 109/71  Pulse: (!) 120 (!) 120 (!) 109 66  Resp: 18 18 18 18   Temp: 98.6 F (37 C) 97.8 F (36.6 C) 99.2 F (37.3 C) 98 F (36.7 C)  TempSrc:      SpO2: 97% 100% 100% 100%  Weight:      Height:       No intake or output data in the 24 hours ending 08/10/21 1037 Filed Weights   08/07/21 1006 08/08/21 0916 08/08/21 1340  Weight: 72.6 kg 75.5 kg 74.2 kg    Examination:  General exam: Appears calm and comfortable  Respiratory system: Clear to auscultation. Respiratory effort normal. Cardiovascular system: S1 & S2 heard, RRR. Right BKA.  Gastrointestinal system: Abdomen is nondistended, soft and nontender.  Normal bowel sounds heard. Central nervous system: Alert and oriented. No focal neurological deficits. Extremities: right BKA.  Skin: No rashes, lesions or ulcers Psychiatry: Mood & affect appropriate.      Data Reviewed: I have personally reviewed following labs and imaging studies  CBC: Recent Labs  Lab 08/05/21 1440 08/05/21 2258 08/06/21 1225 08/08/21 0516 08/09/21 0538  WBC 7.8 6.8 5.5 7.7 8.3  NEUTROABS 5.9  --  3.3  --  5.9  HGB 10.5* 9.1*  9.1* 8.2* 8.5*  HCT 33.9* 29.4* 29.1* 26.0* 27.7*  MCV 103.0* 102.1* 100.3* 102.4* 101.8*  PLT 283 248 255 199 176     Basic Metabolic Panel: Recent Labs  Lab 08/05/21 1440 08/05/21 2258 08/06/21 1725 08/08/21 0516 08/09/21 0538  NA 138  --  139 136 132*  K 5.1  --  4.3 5.1 4.8  CL 98  --  98 97* 94*  CO2 28  --  27 27 27   GLUCOSE 98  --  69* 91 86  BUN 40*  --  22 44* 32*  CREATININE 7.58* 7.69* 5.19* 7.64* 6.25*  CALCIUM 8.4*  --  8.4* 7.5* 7.3*  MG  --   --  1.8  --   --   PHOS  --   --  4.7*  --  5.0*     GFR: Estimated Creatinine Clearance: 11.6 mL/min (A) (by C-G formula based on SCr of 6.25 mg/dL  (H)).  Liver Function Tests: Recent Labs  Lab 08/05/21 1440 08/06/21 1725 08/09/21 0538  AST 14*  --   --   ALT 9  --   --   ALKPHOS 92  --   --   BILITOT 0.7  --   --   PROT 7.6  --   --   ALBUMIN 2.8* 2.7* 2.3*     CBG: Recent Labs  Lab 08/07/21 2155  GLUCAP 117*      Recent Results (from the past 240 hour(s))  Culture, blood (routine x 2)     Status: None (Preliminary result)   Collection Time: 08/05/21  2:40 PM   Specimen: BLOOD  Result Value Ref Range Status   Specimen Description BLOOD RIGHT ANTECUBITAL  Final   Special Requests   Final    BOTTLES DRAWN AEROBIC AND ANAEROBIC Blood Culture results may not be optimal due to an inadequate volume of blood received in culture bottles   Culture   Final    NO GROWTH 4 DAYS Performed at [facility], [address], [place], [place] [address]    Report Status PENDING  Incomplete  Culture, blood (routine x 2)     Status: None (Preliminary result)   Collection Time: 08/05/21  3:30 PM   Specimen: BLOOD  Result Value Ref Range Status   Specimen Description BLOOD BLOOD RIGHT HAND  Final   Special Requests   Final    BOTTLES DRAWN AEROBIC AND ANAEROBIC Blood Culture adequate volume   Culture   Final    NO GROWTH 4 DAYS Performed at [facility], [address] [place], [place] [address]    Report Status PENDING  Incomplete  Resp Panel by RT-PCR (Flu A&B, Covid) Nasopharyngeal Swab     Status: None   Collection Time: 08/05/21  6:36 PM   Specimen: Nasopharyngeal Swab; Nasopharyngeal(NP) swabs in vial transport medium  Result Value Ref Range Status   SARS Coronavirus 2 by RT PCR NEGATIVE NEGATIVE Final    Comment: (NOTE) SARS-CoV-2 target nucleic acids are NOT DETECTED.  The SARS-CoV-2 RNA is generally detectable in upper respiratory specimens during the acute phase of infection. The lowest concentration of SARS-CoV-2 viral copies this assay can detect is 138 copies/mL. A negative result does  not preclude SARS-Cov-2 infection and should not be used as the sole basis for treatment or other patient management decisions. A negative result may occur with  improper specimen collection/handling, submission of specimen other than nasopharyngeal swab, presence of viral mutation(s) within the areas  targeted by this assay, and inadequate number of viral copies(<138 copies/mL). A negative result must be combined with clinical observations, patient history, and epidemiological information. The expected result is Negative.  Fact Sheet for Patients:  EntrepreneurPulse.com.au  Fact Sheet for Healthcare Providers:  IncredibleEmployment.be  This test is no t yet approved or cleared by the [place] FDA and  has been authorized for detection and/or diagnosis of SARS-CoV-2 by FDA under an Emergency Use Authorization (EUA). This EUA will remain  in effect (meaning this test can be used) for the duration of the COVID-19 declaration under Section 564(b)(1) of the Act, 21 U.S.C.section 360bbb-3(b)(1), unless the authorization is terminated  or revoked sooner.       Influenza A by PCR NEGATIVE NEGATIVE Final   Influenza B by PCR NEGATIVE NEGATIVE Final    Comment: (NOTE) The Xpert Xpress SARS-CoV-2/FLU/RSV plus assay is intended as an aid in the diagnosis of influenza from Nasopharyngeal swab specimens and should not be used as a sole basis for treatment. Nasal washings and aspirates are unacceptable for Xpert Xpress SARS-CoV-2/FLU/RSV testing.  Fact Sheet for Patients: EntrepreneurPulse.com.au  Fact Sheet for Healthcare Providers: IncredibleEmployment.be  This test is not yet approved or cleared by the [place] FDA and has been authorized for detection and/or diagnosis of SARS-CoV-2 by FDA under an Emergency Use Authorization (EUA). This EUA will remain in effect (meaning this test can be used) for the  duration of the COVID-19 declaration under Section 564(b)(1) of the Act, 21 U.S.C. section 360bbb-3(b)(1), unless the authorization is terminated or revoked.  Performed at [facility], [address], [place], [place] [address]   Surgical PCR screen     Status: None   Collection Time: 08/06/21 11:23 PM   Specimen: Nasal Mucosa; Nasal Swab  Result Value Ref Range Status   MRSA, PCR NEGATIVE NEGATIVE Final   Staphylococcus aureus NEGATIVE NEGATIVE Final    Comment: (NOTE) The Xpert SA Assay (FDA approved for NASAL specimens in patients 53 years of age and older), is one component of a comprehensive surveillance program. It is not intended to diagnose infection nor to guide or monitor treatment. Performed at [facility], [address] [place], [place] [address]           Radiology Studies: No results found.      Scheduled Meds:  apixaban  5 mg Oral BID   atorvastatin  10 mg Oral QHS   cinacalcet  60 mg Oral Q supper   COVID-19 mRNA bivalent vaccine (Moderna)  0.5 mL Intramuscular ONCE-1600   diltiazem  180 mg Oral Daily   epoetin (EPOGEN/PROCRIT) injection  8,000 Units Intravenous Q T,Th,Sa-HD   feeding supplement  237 mL Oral BID BM   gabapentin  100 mg Oral BID   metaxalone  400 mg Oral TID   metoprolol tartrate  12.5 mg Oral BID   mupirocin ointment  1 application Nasal BID   pantoprazole  40 mg Oral Daily   sevelamer carbonate  2,400 mg Oral TID WC   Continuous Infusions:   LOS: 5 days        Hosie Poisson, MD [facility]   To contact the attending provider between 7A-7P or the covering provider during after hours 7P-7A, please log into the web site [email or web address] and access using universal [place] password for that web site. If you do not have the password, please call the hospital operator.  08/10/2021, 10:37 AM

## 2021-08-10 NOTE — Progress Notes (Signed)
[facility]  ROUNDING NOTE   Subjective:   Scott Castro is a 66 year old male with a past medical history of Barrett's esophagus, hyperparathyroidism, and end-stage renal disease on dialysis.  Patient reports to the emergency department with complaints of leg pain and swelling.  The patient has been admitted for Limb ischemia [I99.8] Ischemic foot ulcer due to atherosclerosis of native artery of limb ([place]) [I70.25, L97.509]  Patient is known to our practice through previous hospitalizations.  Patient receives outpatient dialysis treatments at [facility], supervised by [facility] physicians.    No new complaints.  Reports pain in the stump. Able to eat without nausea or vomiting No shortness of breath Currently on oxygen supplementation with nasal cannula.     Objective:  Vital signs in last 24 hours:  Temp:  [97.8 F (36.6 C)-99.2 F (37.3 C)] 98 F (36.7 C) (01/02 0738) Pulse Rate:  [66-120] 66 (01/02 0738) Resp:  [18] 18 (01/02 0738) BP: (89-109)/(71-74) 109/71 (01/02 0738) SpO2:  [97 %-100 %] 100 % (01/02 0738)  Weight change:  Filed Weights   08/07/21 1006 08/08/21 0916 08/08/21 1340  Weight: 72.6 kg 75.5 kg 74.2 kg    Intake/Output: No intake/output data recorded.   Intake/Output this shift:  No intake/output data recorded.  Physical Exam: General: NAD, resting on bed  Head: Normocephalic, atraumatic. Moist oral mucosal membranes  Eyes: Anicteric  Lungs:  Clear to auscultation, normal effort, O2 via [place]  Heart: Irregular rate and rhythm, crescendo murmur  Abdomen:  Soft, nontender  Extremities: No peripheral edema.right BKA  Neurologic: Nonfocal, moving all four extremities     Access: LUE AVG     Basic Metabolic Panel: Recent Labs  Lab 08/05/21 1440 08/05/21 2258 08/06/21 1725 08/08/21 0516 08/09/21 0538  NA 138  --  139 136 132*  K 5.1  --  4.3 5.1 4.8  CL 98  --  98 97* 94*  CO2 28  --  27 27 27   GLUCOSE 98  --  69* 91 86  BUN 40*   --  22 44* 32*  CREATININE 7.58* 7.69* 5.19* 7.64* 6.25*  CALCIUM 8.4*  --  8.4* 7.5* 7.3*  MG  --   --  1.8  --   --   PHOS  --   --  4.7*  --  5.0*     Liver Function Tests: Recent Labs  Lab 08/05/21 1440 08/06/21 1725 08/09/21 0538  AST 14*  --   --   ALT 9  --   --   ALKPHOS 92  --   --   BILITOT 0.7  --   --   PROT 7.6  --   --   ALBUMIN 2.8* 2.7* 2.3*    No results for input(s): LIPASE, AMYLASE in the last 168 hours. No results for input(s): AMMONIA in the last 168 hours.  CBC: Recent Labs  Lab 08/05/21 1440 08/05/21 2258 08/06/21 1225 08/08/21 0516 08/09/21 0538  WBC 7.8 6.8 5.5 7.7 8.3  NEUTROABS 5.9  --  3.3  --  5.9  HGB 10.5* 9.1* 9.1* 8.2* 8.5*  HCT 33.9* 29.4* 29.1* 26.0* 27.7*  MCV 103.0* 102.1* 100.3* 102.4* 101.8*  PLT 283 248 255 199 176     Cardiac Enzymes: No results for input(s): CKTOTAL, CKMB, CKMBINDEX, TROPONINI in the last 168 hours.  BNP: Invalid input(s): POCBNP  CBG: Recent Labs  Lab 08/07/21 2155  GLUCAP 117*     Microbiology: Results for orders placed or  performed during the hospital encounter of 08/05/21  Culture, blood (routine x 2)     Status: None (Preliminary result)   Collection Time: 08/05/21  2:40 PM   Specimen: BLOOD  Result Value Ref Range Status   Specimen Description BLOOD RIGHT ANTECUBITAL  Final   Special Requests   Final    BOTTLES DRAWN AEROBIC AND ANAEROBIC Blood Culture results may not be optimal due to an inadequate volume of blood received in culture bottles   Culture   Final    NO GROWTH 4 DAYS Performed at [facility], [address] [place], [place] [address]    Report Status PENDING  Incomplete  Culture, blood (routine x 2)     Status: None (Preliminary result)   Collection Time: 08/05/21  3:30 PM   Specimen: BLOOD  Result Value Ref Range Status   Specimen Description BLOOD BLOOD RIGHT HAND  Final   Special Requests   Final    BOTTLES DRAWN AEROBIC AND ANAEROBIC Blood Culture  adequate volume   Culture   Final    NO GROWTH 4 DAYS Performed at [facility], [address], [place], [place] [address]    Report Status PENDING  Incomplete  Resp Panel by RT-PCR (Flu A&B, Covid) Nasopharyngeal Swab     Status: None   Collection Time: 08/05/21  6:36 PM   Specimen: Nasopharyngeal Swab; Nasopharyngeal(NP) swabs in vial transport medium  Result Value Ref Range Status   SARS Coronavirus 2 by RT PCR NEGATIVE NEGATIVE Final    Comment: (NOTE) SARS-CoV-2 target nucleic acids are NOT DETECTED.  The SARS-CoV-2 RNA is generally detectable in upper respiratory specimens during the acute phase of infection. The lowest concentration of SARS-CoV-2 viral copies this assay can detect is 138 copies/mL. A negative result does not preclude SARS-Cov-2 infection and should not be used as the sole basis for treatment or other patient management decisions. A negative result may occur with  improper specimen collection/handling, submission of specimen other than nasopharyngeal swab, presence of viral mutation(s) within the areas targeted by this assay, and inadequate number of viral copies(<138 copies/mL). A negative result must be combined with clinical observations, patient history, and epidemiological information. The expected result is Negative.  Fact Sheet for Patients:  EntrepreneurPulse.com.au  Fact Sheet for Healthcare Providers:  IncredibleEmployment.be  This test is no t yet approved or cleared by the [place] FDA and  has been authorized for detection and/or diagnosis of SARS-CoV-2 by FDA under an Emergency Use Authorization (EUA). This EUA will remain  in effect (meaning this test can be used) for the duration of the COVID-19 declaration under Section 564(b)(1) of the Act, 21 U.S.C.section 360bbb-3(b)(1), unless the authorization is terminated  or revoked sooner.       Influenza A by PCR NEGATIVE NEGATIVE Final    Influenza B by PCR NEGATIVE NEGATIVE Final    Comment: (NOTE) The Xpert Xpress SARS-CoV-2/FLU/RSV plus assay is intended as an aid in the diagnosis of influenza from Nasopharyngeal swab specimens and should not be used as a sole basis for treatment. Nasal washings and aspirates are unacceptable for Xpert Xpress SARS-CoV-2/FLU/RSV testing.  Fact Sheet for Patients: EntrepreneurPulse.com.au  Fact Sheet for Healthcare Providers: IncredibleEmployment.be  This test is not yet approved or cleared by the [place] FDA and has been authorized for detection and/or diagnosis of SARS-CoV-2 by FDA under an Emergency Use Authorization (EUA). This EUA will remain in effect (meaning this test can be used) for the duration of  the COVID-19 declaration under Section 564(b)(1) of the Act, 21 U.S.C. section 360bbb-3(b)(1), unless the authorization is terminated or revoked.  Performed at [facility], [address] [place], [place] [address]   Surgical PCR screen     Status: None   Collection Time: 08/06/21 11:23 PM   Specimen: Nasal Mucosa; Nasal Swab  Result Value Ref Range Status   MRSA, PCR NEGATIVE NEGATIVE Final   Staphylococcus aureus NEGATIVE NEGATIVE Final    Comment: (NOTE) The Xpert SA Assay (FDA approved for NASAL specimens in patients 6 years of age and older), is one component of a comprehensive surveillance program. It is not intended to diagnose infection nor to guide or monitor treatment. Performed at [facility], [place] [place], [place] [address]     Coagulation Studies: No results for input(s): LABPROT, INR in the last 72 hours.   Urinalysis: No results for input(s): COLORURINE, LABSPEC, PHURINE, GLUCOSEU, HGBUR, BILIRUBINUR, KETONESUR, PROTEINUR, UROBILINOGEN, NITRITE, LEUKOCYTESUR in the last 72 hours.  Invalid input(s): APPERANCEUR    Imaging: No results found.   Medications:       apixaban  5 mg Oral BID   atorvastatin  10 mg Oral QHS   cinacalcet  60 mg Oral Q supper   COVID-19 mRNA bivalent vaccine (Moderna)  0.5 mL Intramuscular ONCE-1600   diltiazem  180 mg Oral Daily   epoetin (EPOGEN/PROCRIT) injection  8,000 Units Intravenous Q T,Th,Sa-HD   feeding supplement  237 mL Oral BID BM   gabapentin  100 mg Oral BID   metaxalone  400 mg Oral TID   metoprolol tartrate  12.5 mg Oral BID   mupirocin ointment  1 application Nasal BID   pantoprazole  40 mg Oral Daily   polyethylene glycol  17 g Oral BID   senna-docusate  2 tablet Oral BID   sevelamer carbonate  2,400 mg Oral TID WC   albuterol, HYDROcodone-acetaminophen, HYDROmorphone (DILAUDID) injection, methocarbamol  Assessment/ Plan:  Scott Castro is a 66 y.o.  male with a past medical history of Barrett's esophagus, hyperparathyroidism, and end-stage renal disease on dialysis.  Patient reports to the emergency department with complaints of leg pain and swelling.  The patient has been admitted for Limb ischemia [I99.8] Ischemic foot ulcer due to atherosclerosis of native artery of limb ([place]) [I70.25, L97.509]  [facility]/TTS/LUE AVG/71kg  End-stage renal disease on dialysis.  Patient is on Tuesday Thursday Saturday schedule as an outpatient We will maintain outpatient schedule during this admission, if possible.  Patient was last dialyzed on Saturday Plan for next hemodialysis tomorrow   2. Anemia of chronic kidney disease  Lab Results  Component Value Date   HGB 8.5 (L) 08/09/2021    Hemoglobin is not at goal Mircera prescribed biweekly outpatient.  Last dose 07/29/2021.  Continue Epogen.  Currently at 8000 units 3 times per week  3. Secondary Hyperparathyroidism:  Lab Results  Component Value Date   PTH 93 (H) 04/09/2019   CALCIUM 7.3 (L) 08/09/2021   PHOS 5.0 (H) 08/09/2021  Calcitriol also prescribed outpatient Continue sevelamer with meals  4.  Ischemic foot ulcer.    Patient is now s/p BKA  Vascular team and primary team are following  5.Hyponatremia Likely secondary to pain and ESRD Will follow    LOS: [place] 1/2/20231:43 PM

## 2021-08-10 NOTE — Progress Notes (Addendum)
Inpatient Rehab Admissions:  Inpatient Rehab Consult received.  I spoke with patient on the telephone for rehabilitation assessment and to discuss goals and expectations of an inpatient rehab admission.  Pt acknowledged understanding of CIR goals and expectations. Pt interested in pursuing CIR. Pt gave permission to contact friends, Christia Reading and [facility] to confirm dispo. Left message for Cecille Rubin; awaiting return call. Spoke with Thayer Jew on the telephone. He is supportive of pt pursuing CIR and confirmed he can provide intermittent support/assistance once pt is discharged. Will continue to follow.  ADDENDUM: Cecille Rubin returned call. She is supportive of pt pursuing CIR. She confirmed that she can provide intermittent support/assistance once pt  is discharged.   Signed: Gayland Curry, [place], [place] Admissions Coordinator [phone]

## 2021-08-10 NOTE — Care Management Important Message (Signed)
Important Message  Patient Details  Name: Scott Castro MRN: [id number] Date of Birth: 02-02-1956   Medicare Important Message Given:  Yes     [name] 08/10/2021, 11:47 AM

## 2021-08-11 DIAGNOSIS — N186 End stage renal disease: Secondary | ICD-10-CM | POA: Diagnosis not present

## 2021-08-11 DIAGNOSIS — I998 Other disorder of circulatory system: Secondary | ICD-10-CM | POA: Diagnosis not present

## 2021-08-11 DIAGNOSIS — I7025 Atherosclerosis of native arteries of other extremities with ulceration: Secondary | ICD-10-CM | POA: Diagnosis not present

## 2021-08-11 DIAGNOSIS — I4891 Unspecified atrial fibrillation: Secondary | ICD-10-CM | POA: Diagnosis not present

## 2021-08-11 LAB — CULTURE, BLOOD (ROUTINE X 2)
Culture: NO GROWTH
Culture: NO GROWTH
Special Requests: ADEQUATE

## 2021-08-11 LAB — SURGICAL PATHOLOGY

## 2021-08-11 MED ORDER — EPOETIN ALFA 4000 UNIT/ML IJ SOLN
INTRAMUSCULAR | Status: AC
  Filled 2021-08-11: qty 2

## 2021-08-11 NOTE — Progress Notes (Signed)
PT Cancellation Note  Patient Details Name: SYLAS TWOMBLY MRN: [id number] DOB: [birth year]   Cancelled Treatment:     Pt off floor at dialysis. Will plan to see next available time/date.   Josie Dixon 08/11/2021, 12:37 PM

## 2021-08-11 NOTE — Progress Notes (Signed)
Inpatient Rehab Admissions Coordinator:   Pt. Is not yet medically ready for CIR today. Discussed transitioning off of IV pain meds with acute MD today. I will follow for potential admission once medically ready.  Clemens Catholic, [place], [place] Admissions Coordinator  [phone] ([place]) [phone] (office)

## 2021-08-11 NOTE — Progress Notes (Signed)
[facility]  ROUNDING NOTE   Subjective:   Scott Castro is a 66 year old male with a past medical history of Barrett's esophagus, hyperparathyroidism, and end-stage renal disease on dialysis.  Patient reports to the emergency department with complaints of leg pain and swelling.  The patient has been admitted for Limb ischemia [I99.8] Ischemic foot ulcer due to atherosclerosis of native artery of limb ([place]) [I70.25, L97.509]  Patient is known to our practice through previous hospitalizations.  Patient receives outpatient dialysis treatments at [facility], supervised by [facility] physicians.    Patient seen and evaluated during dialysis   HEMODIALYSIS FLOWSHEET:  Blood Flow Rate (mL/min): 400 mL/min Arterial Pressure (mmHg): -180 mmHg Venous Pressure (mmHg): 160 mmHg Transmembrane Pressure (mmHg): 50 mmHg Ultrafiltration Rate (mL/min): 500 mL/min Dialysate Flow Rate (mL/min): 500 ml/min Conductivity: Machine : 13.9 Conductivity: Machine : 13.9 Dialysis Fluid Bolus: Normal Saline Bolus Amount (mL): 200 mL  Pain controlled at this time No other complaints   Objective:  Vital signs in last 24 hours:  Temp:  [98.1 F (36.7 C)-98.8 F (37.1 C)] 98.8 F (37.1 C) (01/03 0915) Pulse Rate:  [26-121] 121 (01/03 1215) Resp:  [14-23] 15 (01/03 1245) BP: (90-137)/(66-86) 128/72 (01/03 1245) SpO2:  [32 %-100 %] 96 % (01/03 1215) Weight:  [71.1 kg] 71.1 kg (01/03 0915)  Weight change:  Filed Weights   08/08/21 0916 08/08/21 1340 08/11/21 0915  Weight: 75.5 kg 74.2 kg 71.1 kg    Intake/Output: I/O last 3 completed shifts: In: 240 [Other:240] Out: -    Intake/Output this shift:  No intake/output data recorded.  Physical Exam: General: NAD, resting on bed  Head: Normocephalic, atraumatic. Moist oral mucosal membranes  Eyes: Anicteric  Lungs:  Clear to auscultation, normal effort, O2 via [place]  Heart: Irregular rate and rhythm, crescendo murmur  Abdomen:  Soft,  nontender  Extremities: No peripheral edema.right BKA  Neurologic: Nonfocal, moving all four extremities     Access: LUE AVG     Basic Metabolic Panel: Recent Labs  Lab 08/05/21 1440 08/05/21 2258 08/06/21 1725 08/08/21 0516 08/09/21 0538  NA 138  --  139 136 132*  K 5.1  --  4.3 5.1 4.8  CL 98  --  98 97* 94*  CO2 28  --  27 27 27   GLUCOSE 98  --  69* 91 86  BUN 40*  --  22 44* 32*  CREATININE 7.58* 7.69* 5.19* 7.64* 6.25*  CALCIUM 8.4*  --  8.4* 7.5* 7.3*  MG  --   --  1.8  --   --   PHOS  --   --  4.7*  --  5.0*     Liver Function Tests: Recent Labs  Lab 08/05/21 1440 08/06/21 1725 08/09/21 0538  AST 14*  --   --   ALT 9  --   --   ALKPHOS 92  --   --   BILITOT 0.7  --   --   PROT 7.6  --   --   ALBUMIN 2.8* 2.7* 2.3*    No results for input(s): LIPASE, AMYLASE in the last 168 hours. No results for input(s): AMMONIA in the last 168 hours.  CBC: Recent Labs  Lab 08/05/21 1440 08/05/21 2258 08/06/21 1225 08/08/21 0516 08/09/21 0538  WBC 7.8 6.8 5.5 7.7 8.3  NEUTROABS 5.9  --  3.3  --  5.9  HGB 10.5* 9.1* 9.1* 8.2* 8.5*  HCT 33.9* 29.4* 29.1* 26.0* 27.7*  MCV 103.0*  102.1* 100.3* 102.4* 101.8*  PLT 283 248 255 199 176     Cardiac Enzymes: No results for input(s): CKTOTAL, CKMB, CKMBINDEX, TROPONINI in the last 168 hours.  BNP: Invalid input(s): POCBNP  CBG: Recent Labs  Lab 08/07/21 2155  GLUCAP 117*     Microbiology: Results for orders placed or performed during the hospital encounter of 08/05/21  Culture, blood (routine x 2)     Status: None (Preliminary result)   Collection Time: 08/05/21  2:40 PM   Specimen: BLOOD  Result Value Ref Range Status   Specimen Description BLOOD RIGHT ANTECUBITAL  Final   Special Requests   Final    BOTTLES DRAWN AEROBIC AND ANAEROBIC Blood Culture results may not be optimal due to an inadequate volume of blood received in culture bottles   Culture   Final    NO GROWTH 4 DAYS Performed at [facility], [address], [place], [place] [address]    Report Status PENDING  Incomplete  Culture, blood (routine x 2)     Status: None (Preliminary result)   Collection Time: 08/05/21  3:30 PM   Specimen: BLOOD  Result Value Ref Range Status   Specimen Description BLOOD BLOOD RIGHT HAND  Final   Special Requests   Final    BOTTLES DRAWN AEROBIC AND ANAEROBIC Blood Culture adequate volume   Culture   Final    NO GROWTH 4 DAYS Performed at [facility], [address], [place], [place] [address]    Report Status PENDING  Incomplete  Resp Panel by RT-PCR (Flu A&B, Covid) Nasopharyngeal Swab     Status: None   Collection Time: 08/05/21  6:36 PM   Specimen: Nasopharyngeal Swab; Nasopharyngeal(NP) swabs in vial transport medium  Result Value Ref Range Status   SARS Coronavirus 2 by RT PCR NEGATIVE NEGATIVE Final    Comment: (NOTE) SARS-CoV-2 target nucleic acids are NOT DETECTED.  The SARS-CoV-2 RNA is generally detectable in upper respiratory specimens during the acute phase of infection. The lowest concentration of SARS-CoV-2 viral copies this assay can detect is 138 copies/mL. A negative result does not preclude SARS-Cov-2 infection and should not be used as the sole basis for treatment or other patient management decisions. A negative result may occur with  improper specimen collection/handling, submission of specimen other than nasopharyngeal swab, presence of viral mutation(s) within the areas targeted by this assay, and inadequate number of viral copies(<138 copies/mL). A negative result must be combined with clinical observations, patient history, and epidemiological information. The expected result is Negative.  Fact Sheet for Patients:  EntrepreneurPulse.com.au  Fact Sheet for Healthcare Providers:  IncredibleEmployment.be  This test is no t yet approved or cleared by the [place] FDA and  has been authorized for  detection and/or diagnosis of SARS-CoV-2 by FDA under an Emergency Use Authorization (EUA). This EUA will remain  in effect (meaning this test can be used) for the duration of the COVID-19 declaration under Section 564(b)(1) of the Act, 21 U.S.C.section 360bbb-3(b)(1), unless the authorization is terminated  or revoked sooner.       Influenza A by PCR NEGATIVE NEGATIVE Final   Influenza B by PCR NEGATIVE NEGATIVE Final    Comment: (NOTE) The Xpert Xpress SARS-CoV-2/FLU/RSV plus assay is intended as an aid in the diagnosis of influenza from Nasopharyngeal swab specimens and should not be used as a sole basis for treatment. Nasal washings and aspirates are unacceptable for Xpert Xpress SARS-CoV-2/FLU/RSV testing.  Fact Sheet for Patients:  EntrepreneurPulse.com.au  Fact Sheet for Healthcare Providers: IncredibleEmployment.be  This test is not yet approved or cleared by the [place] FDA and has been authorized for detection and/or diagnosis of SARS-CoV-2 by FDA under an Emergency Use Authorization (EUA). This EUA will remain in effect (meaning this test can be used) for the duration of the COVID-19 declaration under Section 564(b)(1) of the Act, 21 U.S.C. section 360bbb-3(b)(1), unless the authorization is terminated or revoked.  Performed at [facility], [address] [place], [place] [address]   Surgical PCR screen     Status: None   Collection Time: 08/06/21 11:23 PM   Specimen: Nasal Mucosa; Nasal Swab  Result Value Ref Range Status   MRSA, PCR NEGATIVE NEGATIVE Final   Staphylococcus aureus NEGATIVE NEGATIVE Final    Comment: (NOTE) The Xpert SA Assay (FDA approved for NASAL specimens in patients 70 years of age and older), is one component of a comprehensive surveillance program. It is not intended to diagnose infection nor to guide or monitor treatment. Performed at [facility], [address]  [place], [place] [address]     Coagulation Studies: No results for input(s): LABPROT, INR in the last 72 hours.   Urinalysis: No results for input(s): COLORURINE, LABSPEC, PHURINE, GLUCOSEU, HGBUR, BILIRUBINUR, KETONESUR, PROTEINUR, UROBILINOGEN, NITRITE, LEUKOCYTESUR in the last 72 hours.  Invalid input(s): APPERANCEUR    Imaging: No results found.   Medications:      apixaban  5 mg Oral BID   atorvastatin  10 mg Oral QHS   cinacalcet  60 mg Oral Q supper   diltiazem  180 mg Oral Daily   epoetin alfa       epoetin (EPOGEN/PROCRIT) injection  8,000 Units Intravenous Q T,Th,Sa-HD   feeding supplement  237 mL Oral BID BM   gabapentin  100 mg Oral BID   metaxalone  400 mg Oral TID   metoprolol tartrate  12.5 mg Oral BID   mupirocin ointment  1 application Nasal BID   pantoprazole  40 mg Oral Daily   polyethylene glycol  17 g Oral BID   senna-docusate  2 tablet Oral BID   sevelamer carbonate  2,400 mg Oral TID WC   albuterol, HYDROcodone-acetaminophen, methocarbamol  Assessment/ Plan:  [name] is a 66 y.o.  male with a past medical history of Barrett's esophagus, hyperparathyroidism, and end-stage renal disease on dialysis.  Patient reports to the emergency department with complaints of leg pain and swelling.  The patient has been admitted for Limb ischemia [I99.8] Ischemic foot ulcer due to atherosclerosis of native artery of limb ([place]) [I70.25, L97.509]  [facility]/TTS/LUE AVG/71kg  End-stage renal disease on dialysis.  Patient is on Tuesday Thursday Saturday schedule as an outpatient We will maintain outpatient schedule during this admission, if possible.  Receiving dialysis today, UF goal 1-1.5L as tolerated. Next treatment scheduled for Thursday.   2. Anemia of chronic kidney disease  Lab Results  Component Value Date   HGB 8.5 (L) 08/09/2021    Hemoglobin is not at goal Mircera prescribed biweekly outpatient.  Last dose 07/29/2021.  Continue  Epogen 8000 units 3 times per week  3. Secondary Hyperparathyroidism:  Lab Results  Component Value Date   PTH 93 (H) 04/09/2019   CALCIUM 7.3 (L) 08/09/2021   PHOS 5.0 (H) 08/09/2021  Calcitriol also prescribed outpatient Continue sevelamer with meals  4.  Ischemic foot ulcer.   Patient is now s/p BKA  Vascular team and primary team  are following  5.Hyponatremia Likely secondary to pain and ESRD Will follow and correct with dialysis    LOS: 6 [place] 1/3/20232:04 PM

## 2021-08-11 NOTE — Progress Notes (Signed)
PROGRESS NOTE    Scott Castro  DPO:[id number] DOB: 04-23-1956 DOA: 08/05/2021 PCP: Pcp, No    Chief Complaint  Patient presents with   Foot Pain   Leg Swelling    Brief Narrative:  66 year old male with ESRD on TTS , s/p RENAL transplant, sec hyperparathyroidism, Barretts esophagus, PAF on eliquis, admitted for right foot pain, had critical limb ischemia, was seen by vascular surgeon , underwent right Right BKA, . Therapy eval recommending inpatient rehab.   Assessment & Plan:   Principal Problem:   Ischemic foot ulcer due to atherosclerosis of native artery of limb ([place]) Active Problems:   ESRD (end stage renal disease) (H[facility])   Hypoalbuminemia due to protein-calorie malnutrition (H[facility])   Anemia   PAD (peripheral artery disease) (H[facility])   GERD (gastroesophageal reflux disease)   Atrial fibrillation ([place])   RLE critical limb ischemia s/pBKA:  on 12/30. Incision site looking good.  Pain control with oral vicodin. IV dilaudid has been discontinued. Patient reports he hasn't had any IV pain meds so far today.  On gabapentin BID. Added robaxin for muscle spasms with some improvement.  Therapy eval recommending CIR.    ESRD on TTS.  Further management as per nephrology.    GERD Stable, on PPI.    PAF;  Rate controlled with cardizem and metoprolol 12.5 mg BID.  On eliquis for anti coagulation.    Anemia of chronic disease - transfuse to keep hemoglobin greater than 7. Currently hemoglobin around 8.5. anemia panel shows low iron levels. Folate and vit b12 are adequate.  Continue with epogen on the days of HD.  Continue with Renvela.    Moderate malnutrition:  Dietary on board.    Multiple lung Nodules:  - outpatient follow up with PCP and annual CT  Hyperlipidemia:  Resume lipitor.   Hyponatremia;  From ESRD.    DVT prophylaxis: eliquis.  Code Status:full code.  Family Communication: none at bedside.  Disposition:   Status is: Inpatient  Remains  inpatient appropriate because: inpatient rehab. Pt is medically stable for discharge.        Consultants:  Nephrology Vascular surgery.   Procedures:  s/pBKA:  on 12/30.  Antimicrobials:  Antibiotics Given (last 72 hours)     None         Subjective: Improving pain and muscle spasms.    Objective: Vitals:   08/11/21 1200 08/11/21 1215 08/11/21 1230 08/11/21 1245  BP: 129/75 119/80 137/76 128/72  Pulse:  (!) 121    Resp: 17 16 15 15   Temp:      TempSrc:      SpO2:  96%    Weight:      Height:        Intake/Output Summary (Last 24 hours) at 08/11/2021 1250 Last data filed at 08/10/2021 2100 Gross per 24 hour  Intake 240 ml  Output --  Net 240 ml   Filed Weights   08/08/21 0916 08/08/21 1340 08/11/21 0915  Weight: 75.5 kg 74.2 kg 71.1 kg    Examination:  General exam: Appears calm and comfortable  Respiratory system: Clear to auscultation. Respiratory effort normal. Cardiovascular system: S1 & S2 heard, RRR. No JVD.  Gastrointestinal system: Abdomen is nondistended, soft and nontender. Normal bowel sounds heard. Central nervous system: Alert and oriented. No focal neurological deficits. Extremities: right BKA.  Skin: No rashes, lesions or ulcers Psychiatry:Mood & affect appropriate.       Data Reviewed: I have personally reviewed following labs and  imaging studies  CBC: Recent Labs  Lab 08/05/21 1440 08/05/21 2258 08/06/21 1225 08/08/21 0516 08/09/21 0538  WBC 7.8 6.8 5.5 7.7 8.3  NEUTROABS 5.9  --  3.3  --  5.9  HGB 10.5* 9.1* 9.1* 8.2* 8.5*  HCT 33.9* 29.4* 29.1* 26.0* 27.7*  MCV 103.0* 102.1* 100.3* 102.4* 101.8*  PLT 283 248 255 199 176     Basic Metabolic Panel: Recent Labs  Lab 08/05/21 1440 08/05/21 2258 08/06/21 1725 08/08/21 0516 08/09/21 0538  NA 138  --  139 136 132*  K 5.1  --  4.3 5.1 4.8  CL 98  --  98 97* 94*  CO2 28  --  27 27 27   GLUCOSE 98  --  69* 91 86  BUN 40*  --  22 44* 32*  CREATININE 7.58* 7.69* 5.19*  7.64* 6.25*  CALCIUM 8.4*  --  8.4* 7.5* 7.3*  MG  --   --  1.8  --   --   PHOS  --   --  4.7*  --  5.0*     GFR: Estimated Creatinine Clearance: 11.6 mL/min (A) (by C-G formula based on SCr of 6.25 mg/dL (H)).  Liver Function Tests: Recent Labs  Lab 08/05/21 1440 08/06/21 1725 08/09/21 0538  AST 14*  --   --   ALT 9  --   --   ALKPHOS 92  --   --   BILITOT 0.7  --   --   PROT 7.6  --   --   ALBUMIN 2.8* 2.7* 2.3*     CBG: Recent Labs  Lab 08/07/21 2155  GLUCAP 117*      Recent Results (from the past 240 hour(s))  Culture, blood (routine x 2)     Status: None (Preliminary result)   Collection Time: 08/05/21  2:40 PM   Specimen: BLOOD  Result Value Ref Range Status   Specimen Description BLOOD RIGHT ANTECUBITAL  Final   Special Requests   Final    BOTTLES DRAWN AEROBIC AND ANAEROBIC Blood Culture results may not be optimal due to an inadequate volume of blood received in culture bottles   Culture   Final    NO GROWTH 4 DAYS Performed at [facility], [address], [place], [place] [address]    Report Status PENDING  Incomplete  Culture, blood (routine x 2)     Status: None (Preliminary result)   Collection Time: 08/05/21  3:30 PM   Specimen: BLOOD  Result Value Ref Range Status   Specimen Description BLOOD BLOOD RIGHT HAND  Final   Special Requests   Final    BOTTLES DRAWN AEROBIC AND ANAEROBIC Blood Culture adequate volume   Culture   Final    NO GROWTH 4 DAYS Performed at [facility], [address] [place], [place] [address]    Report Status PENDING  Incomplete  Resp Panel by RT-PCR (Flu A&B, Covid) Nasopharyngeal Swab     Status: None   Collection Time: 08/05/21  6:36 PM   Specimen: Nasopharyngeal Swab; Nasopharyngeal(NP) swabs in vial transport medium  Result Value Ref Range Status   SARS Coronavirus 2 by RT PCR NEGATIVE NEGATIVE Final    Comment: (NOTE) SARS-CoV-2 target nucleic acids are NOT DETECTED.  The SARS-CoV-2 RNA  is generally detectable in upper respiratory specimens during the acute phase of infection. The lowest concentration of SARS-CoV-2 viral copies this assay can detect is 138 copies/mL. A negative result does not preclude SARS-Cov-2 infection  and should not be used as the sole basis for treatment or other patient management decisions. A negative result may occur with  improper specimen collection/handling, submission of specimen other than nasopharyngeal swab, presence of viral mutation(s) within the areas targeted by this assay, and inadequate number of viral copies(<138 copies/mL). A negative result must be combined with clinical observations, patient history, and epidemiological information. The expected result is Negative.  Fact Sheet for Patients:  EntrepreneurPulse.com.au  Fact Sheet for Healthcare Providers:  IncredibleEmployment.be  This test is no t yet approved or cleared by the [place] FDA and  has been authorized for detection and/or diagnosis of SARS-CoV-2 by FDA under an Emergency Use Authorization (EUA). This EUA will remain  in effect (meaning this test can be used) for the duration of the COVID-19 declaration under Section 564(b)(1) of the Act, 21 U.S.C.section 360bbb-3(b)(1), unless the authorization is terminated  or revoked sooner.       Influenza A by PCR NEGATIVE NEGATIVE Final   Influenza B by PCR NEGATIVE NEGATIVE Final    Comment: (NOTE) The Xpert Xpress SARS-CoV-2/FLU/RSV plus assay is intended as an aid in the diagnosis of influenza from Nasopharyngeal swab specimens and should not be used as a sole basis for treatment. Nasal washings and aspirates are unacceptable for Xpert Xpress SARS-CoV-2/FLU/RSV testing.  Fact Sheet for Patients: EntrepreneurPulse.com.au  Fact Sheet for Healthcare Providers: IncredibleEmployment.be  This test is not yet approved or cleared by the  [place] FDA and has been authorized for detection and/or diagnosis of SARS-CoV-2 by FDA under an Emergency Use Authorization (EUA). This EUA will remain in effect (meaning this test can be used) for the duration of the COVID-19 declaration under Section 564(b)(1) of the Act, 21 U.S.C. section 360bbb-3(b)(1), unless the authorization is terminated or revoked.  Performed at [facility], [address] [place], [place] [address]   Surgical PCR screen     Status: None   Collection Time: 08/06/21 11:23 PM   Specimen: Nasal Mucosa; Nasal Swab  Result Value Ref Range Status   MRSA, PCR NEGATIVE NEGATIVE Final   Staphylococcus aureus NEGATIVE NEGATIVE Final    Comment: (NOTE) The Xpert SA Assay (FDA approved for NASAL specimens in patients 29 years of age and older), is one component of a comprehensive surveillance program. It is not intended to diagnose infection nor to guide or monitor treatment. Performed at [facility], [address], [place], [place] [address]           Radiology Studies: No results found.      Scheduled Meds:  apixaban  5 mg Oral BID   atorvastatin  10 mg Oral QHS   cinacalcet  60 mg Oral Q supper   diltiazem  180 mg Oral Daily   epoetin alfa       epoetin (EPOGEN/PROCRIT) injection  8,000 Units Intravenous Q T,Th,Sa-HD   feeding supplement  237 mL Oral BID BM   gabapentin  100 mg Oral BID   metaxalone  400 mg Oral TID   metoprolol tartrate  12.5 mg Oral BID   mupirocin ointment  1 application Nasal BID   pantoprazole  40 mg Oral Daily   polyethylene glycol  17 g Oral BID   senna-docusate  2 tablet Oral BID   sevelamer carbonate  2,400 mg Oral TID WC   Continuous Infusions:   LOS: 6 days        Hosie Poisson, MD [facility]   To contact the attending  provider between 7A-7P or the covering provider during after hours 7P-7A, please log into the web site [email or web address] and access using universal [facility] password for that web site. If you do not have the password, please call the hospital operator.  08/11/2021, 12:50 PM

## 2021-08-11 NOTE — Plan of Care (Signed)
  Problem: Clinical Measurements: Goal: Ability to maintain clinical measurements within normal limits will improve Outcome: Progressing   

## 2021-08-11 NOTE — Progress Notes (Signed)
[facility]  Daily Progress Note   Subjective  -   No major events overnight.  Pain control is reasonable.  Trying to get the right knee a little straighter.  Not completely contracted, but has difficulty getting it all the way straight.  Objective Vitals:   08/11/21 1230 08/11/21 1245 08/11/21 1300 08/11/21 1545  BP: 137/76 128/72 140/84 120/81  Pulse:    (!) 119  Resp: 15 15 (!) 22 18  Temp:   99.1 F (37.3 C) 98.7 F (37.1 C)  TempSrc:   Oral   SpO2:    100%  Weight:   68.4 kg   Height:        Intake/Output Summary (Last 24 hours) at 08/11/2021 1628 Last data filed at 08/11/2021 1300 Gross per 24 hour  Intake 240 ml  Output 1001 ml  Net -761 ml    PULM  CTAB CV  tachycardia VASC  dressing on right stump is clean, dry, and intact.  Laboratory CBC    Component Value Date/Time   WBC 8.3 08/09/2021 0538   HGB 8.5 (L) 08/09/2021 0538   HGB 11.4 (L) 06/11/2012 0501   HCT 27.7 (L) 08/09/2021 0538   HCT 34.6 (L) 06/11/2012 0501   PLT 176 08/09/2021 0538   PLT 251 06/11/2012 0501    BMET    Component Value Date/Time   NA 132 (L) 08/09/2021 0538   NA 142 06/10/2012 0924   K 4.8 08/09/2021 0538   K 4.5 06/16/2013 1315   CL 94 (L) 08/09/2021 0538   CL 102 06/10/2012 0924   CO2 27 08/09/2021 0538   CO2 29 06/10/2012 0924   GLUCOSE 86 08/09/2021 0538   GLUCOSE 83 06/10/2012 0924   BUN 32 (H) 08/09/2021 0538   BUN 35 (H) 06/10/2012 0924   CREATININE 6.25 (H) 08/09/2021 0538   CREATININE 10.29 (H) 06/10/2012 0924   CALCIUM 7.3 (L) 08/09/2021 0538   CALCIUM 9.2 06/10/2012 0924   GFRNONAA 9 (L) 08/09/2021 0538   GFRNONAA 5 (L) 06/10/2012 0924   GFRAA 9 (L) 09/18/2019 0430   GFRAA 6 (L) 06/10/2012 0924    Assessment/Planning: POD #4 s/p right BKA  Okay to discharge to rehab from my point of view Would keep the bandage on the wound for physical therapy to avoid traumatizing the area, but it can be open to air some Should follow-up in  our office in 2 to 3 weeks for wound check and staple removal Continue to work on straightening the knee is much as possible.   Leotis Pain  08/11/2021, 4:28 PM

## 2021-08-11 NOTE — Progress Notes (Signed)
Patient completes scheduled 3-hr. hemodialysis session via LUA AVF. Cannulation difficult due to stent placement, however, prescribed BFR maintained throughout treatment. Targeted UF met, without side effects of cramping, hypotension, or dizziness. Patient given Norco at the onset of treatment for moderate pain with good relief 6/10 to 2/10. Minimal bleeding from venous insertion site, more pronounced bleeding from arterial insertion site requiring additional hold time. Patient received dialysis related medication per MD order. Patient seeking clarity on discharge plan, anticipating a short stay at a rehab facility. Report given to primary nurse, patient returned to assigned room.

## 2021-08-11 NOTE — Progress Notes (Signed)
Occupational Therapy Treatment Patient Details Name: Scott Castro MRN: [id number] DOB: 08/13/1955 Today's Date: 08/11/2021   History of present illness Pt is a 66 y/o M admitted on 08/05/21. Pt with previous admisson on 12/10-12/14 2/2 to R foot pain & pt had ABI & angioplasty & was discharged home. Pt returned to the ED on 08/05/21 with c/o R foot & leg pain, critical limb ischemia requiring heparinization. Pt underwent R BKA on 08/07/21. 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 Upon arrival to room pt reclined in bed, agreeable to tx. Pt requesting OOB to chair, reports poor tolerance for keeping RLE knee straight but willing to try - educated on importance. Pt requires MIN A exit L side of bed. MOD A + RW for bed>chair t/f, unable to tolerate prolonged standing to trial standing grooming tasks. SETUP + SUPERVISION seated grooming. Left in chair with all needs met. Pt making good progress toward goals. Pt continues to benefit from skilled OT services to maximize return to PLOF and minimize risk of future falls, injury, caregiver burden, and readmission. Will continue to follow POC. Discharge recommendation remains appropriate.     Recommendations for follow up therapy are one component of a multi-disciplinary discharge planning process, led by the attending physician.  Recommendations may be updated based on patient status, additional functional criteria and insurance authorization.    Follow Up Recommendations  Acute inpatient rehab (3hours/day)    Assistance Recommended at Discharge Intermittent Supervision/Assistance  Patient can return home with the following  A lot of help with walking and/or transfers;A lot of help with bathing/dressing/bathroom;Help with stairs or ramp for entrance   Equipment Recommendations  Other (comment) (defer to next venue  of care)    Recommendations for Other Services      Precautions / Restrictions Precautions Precautions: Fall Restrictions Weight Bearing Restrictions: Yes RLE Weight Bearing: Non weight bearing       Mobility Bed Mobility Overal bed mobility: Needs Assistance Bed Mobility: Supine to Sit     Supine to sit: Min assist          Transfers Overall transfer level: Needs assistance Equipment used: Rolling walker (2 wheels) Transfers: Sit to/from Stand Sit to Stand: Mod assist Stand pivot transfers: Mod assist               Balance Overall balance assessment: Needs assistance Sitting-balance support: Single extremity supported Sitting balance-Leahy Scale: Fair     Standing balance support: Bilateral upper extremity supported;Reliant on assistive device for balance Standing balance-Leahy Scale: Poor                             ADL either performed or assessed with clinical judgement   ADL Overall ADL's : Needs assistance/impaired                                       General ADL Comments: MOD A + RW for bed>chair t/f, unable to tolerate prolonged standing to trial standing grooming tasks. SETUP + SUPERVISION seated grooming.      Cognition Arousal/Alertness: Awake/alert Behavior During Therapy: WFL for tasks assessed/performed Overall Cognitive Status: Within Functional Limits for tasks assessed  Pertinent Vitals/ Pain       Pain Assessment: 0-10 Pain Score: 6  Pain Location: R residual limb with mobility Pain Descriptors / Indicators: Discomfort;Grimacing Pain Intervention(s): Limited activity within patient's tolerance;Repositioned;Premedicated before session   Frequency  Min 5X/week        Progress Toward Goals  OT Goals(current goals can now be found in the care plan section)  Progress towards OT goals: Progressing toward goals  Acute  Rehab OT Goals Patient Stated Goal: to improve pain OT Goal Formulation: With patient Time For Goal Achievement: 08/23/21 Potential to Achieve Goals: Good ADL Goals Pt Will Perform Lower Body Dressing: with supervision;with set-up;sitting/lateral leans Pt Will Transfer to Toilet: stand pivot transfer;with supervision;with set-up;bedside commode Pt Will Perform Toileting - Clothing Manipulation and hygiene: with adaptive equipment;with supervision;with set-up;sit to/from stand  Plan Discharge plan remains appropriate;Frequency remains appropriate    Co-evaluation                 AM-PAC OT "6 Clicks" Daily Activity     Outcome Measure   Help from another person eating meals?: A Little Help from another person taking care of personal grooming?: A Little Help from another person toileting, which includes using toliet, bedpan, or urinal?: A Lot Help from another person bathing (including washing, rinsing, drying)?: A Lot Help from another person to put on and taking off regular upper body clothing?: A Little Help from another person to put on and taking off regular lower body clothing?: A Lot 6 Click Score: 15    End of Session Equipment Utilized During Treatment: Gait belt;Rolling walker (2 wheels)  OT Visit Diagnosis: Other abnormalities of gait and mobility (R26.89);Muscle weakness (generalized) (M62.81);Pain Pain - Right/Left: Right Pain - part of body: Knee;Leg;Hip   Activity Tolerance Patient tolerated treatment well   Patient Left in chair;with call bell/phone within reach;with chair alarm set   Nurse Communication          Time: 9767-3419 OT Time Calculation (min): 15 min  Charges: OT General Charges $OT Visit: 1 Visit OT Treatments $Self Care/Home Management : 8-22 mins  Dessie Coma, M.S. OTR/L  08/11/21, 3:49 PM  ascom [phone]

## 2021-08-11 NOTE — Progress Notes (Signed)
OT Cancellation Note  Patient Details Name: Scott Castro MRN: [id number] DOB: [birth year]   Cancelled Treatment:    Reason Eval/Treat Not Completed: Patient at procedure or test/ unavailable. Pt noted to be off the floor for HD, unavailable at this time. Will continue to follow POC later this date as pt available.   Dessie Coma, M.S. OTR/L  08/11/21, 9:21 AM  ascom [phone]

## 2021-08-12 ENCOUNTER — Encounter (HOSPITAL_COMMUNITY): Payer: Self-pay | Admitting: Physical Medicine and Rehabilitation

## 2021-08-12 ENCOUNTER — Other Ambulatory Visit: Payer: Self-pay

## 2021-08-12 ENCOUNTER — Inpatient Hospital Stay (HOSPITAL_COMMUNITY)
Admission: RE | Admit: 2021-08-12 | Discharge: 2021-08-30 | DRG: 559 | Disposition: A | Discharge status: Other Acute Inpatient Hospital | Payer: Medicare Other | Source: Other Acute Inpatient Hospital | Attending: Physical Medicine and Rehabilitation | Admitting: Physical Medicine and Rehabilitation

## 2021-08-12 DIAGNOSIS — Z7902 Long term (current) use of antithrombotics/antiplatelets: Secondary | ICD-10-CM | POA: Diagnosis not present

## 2021-08-12 DIAGNOSIS — I4811 Longstanding persistent atrial fibrillation: Secondary | ICD-10-CM | POA: Diagnosis not present

## 2021-08-12 DIAGNOSIS — E1151 Type 2 diabetes mellitus with diabetic peripheral angiopathy without gangrene: Secondary | ICD-10-CM | POA: Diagnosis present

## 2021-08-12 DIAGNOSIS — N2581 Secondary hyperparathyroidism of renal origin: Secondary | ICD-10-CM | POA: Diagnosis present

## 2021-08-12 DIAGNOSIS — I70222 Atherosclerosis of native arteries of extremities with rest pain, left leg: Secondary | ICD-10-CM | POA: Diagnosis not present

## 2021-08-12 DIAGNOSIS — M62838 Other muscle spasm: Secondary | ICD-10-CM | POA: Diagnosis not present

## 2021-08-12 DIAGNOSIS — S90821A Blister (nonthermal), right foot, initial encounter: Secondary | ICD-10-CM | POA: Diagnosis not present

## 2021-08-12 DIAGNOSIS — N250 Renal osteodystrophy: Secondary | ICD-10-CM | POA: Diagnosis present

## 2021-08-12 DIAGNOSIS — I1311 Hypertensive heart and chronic kidney disease without heart failure, with stage 5 chronic kidney disease, or end stage renal disease: Secondary | ICD-10-CM | POA: Diagnosis present

## 2021-08-12 DIAGNOSIS — G8918 Other acute postprocedural pain: Secondary | ICD-10-CM | POA: Diagnosis present

## 2021-08-12 DIAGNOSIS — Z9981 Dependence on supplemental oxygen: Secondary | ICD-10-CM

## 2021-08-12 DIAGNOSIS — I4891 Unspecified atrial fibrillation: Secondary | ICD-10-CM | POA: Diagnosis not present

## 2021-08-12 DIAGNOSIS — T8612 Kidney transplant failure: Secondary | ICD-10-CM | POA: Diagnosis present

## 2021-08-12 DIAGNOSIS — L89616 Pressure-induced deep tissue damage of right heel: Secondary | ICD-10-CM | POA: Diagnosis not present

## 2021-08-12 DIAGNOSIS — I10 Essential (primary) hypertension: Secondary | ICD-10-CM | POA: Diagnosis not present

## 2021-08-12 DIAGNOSIS — K2270 Barrett's esophagus without dysplasia: Secondary | ICD-10-CM | POA: Diagnosis present

## 2021-08-12 DIAGNOSIS — I4819 Other persistent atrial fibrillation: Secondary | ICD-10-CM | POA: Diagnosis not present

## 2021-08-12 DIAGNOSIS — Z4781 Encounter for orthopedic aftercare following surgical amputation: Secondary | ICD-10-CM | POA: Diagnosis present

## 2021-08-12 DIAGNOSIS — J9611 Chronic respiratory failure with hypoxia: Secondary | ICD-10-CM | POA: Diagnosis present

## 2021-08-12 DIAGNOSIS — Z515 Encounter for palliative care: Secondary | ICD-10-CM | POA: Diagnosis not present

## 2021-08-12 DIAGNOSIS — F329 Major depressive disorder, single episode, unspecified: Secondary | ICD-10-CM

## 2021-08-12 DIAGNOSIS — M79675 Pain in left toe(s): Secondary | ICD-10-CM | POA: Diagnosis not present

## 2021-08-12 DIAGNOSIS — R011 Cardiac murmur, unspecified: Secondary | ICD-10-CM | POA: Diagnosis not present

## 2021-08-12 DIAGNOSIS — Z89511 Acquired absence of right leg below knee: Secondary | ICD-10-CM | POA: Diagnosis not present

## 2021-08-12 DIAGNOSIS — Z7189 Other specified counseling: Secondary | ICD-10-CM | POA: Diagnosis not present

## 2021-08-12 DIAGNOSIS — K219 Gastro-esophageal reflux disease without esophagitis: Secondary | ICD-10-CM | POA: Diagnosis present

## 2021-08-12 DIAGNOSIS — I739 Peripheral vascular disease, unspecified: Secondary | ICD-10-CM | POA: Diagnosis present

## 2021-08-12 DIAGNOSIS — Z8616 Personal history of COVID-19: Secondary | ICD-10-CM

## 2021-08-12 DIAGNOSIS — D509 Iron deficiency anemia, unspecified: Secondary | ICD-10-CM | POA: Diagnosis present

## 2021-08-12 DIAGNOSIS — E1122 Type 2 diabetes mellitus with diabetic chronic kidney disease: Secondary | ICD-10-CM | POA: Diagnosis present

## 2021-08-12 DIAGNOSIS — R9389 Abnormal findings on diagnostic imaging of other specified body structures: Secondary | ICD-10-CM | POA: Diagnosis not present

## 2021-08-12 DIAGNOSIS — I480 Paroxysmal atrial fibrillation: Secondary | ICD-10-CM | POA: Diagnosis not present

## 2021-08-12 DIAGNOSIS — Z716 Tobacco abuse counseling: Secondary | ICD-10-CM

## 2021-08-12 DIAGNOSIS — I7025 Atherosclerosis of native arteries of other extremities with ulceration: Secondary | ICD-10-CM | POA: Diagnosis present

## 2021-08-12 DIAGNOSIS — N186 End stage renal disease: Secondary | ICD-10-CM | POA: Diagnosis present

## 2021-08-12 DIAGNOSIS — L97509 Non-pressure chronic ulcer of other part of unspecified foot with unspecified severity: Secondary | ICD-10-CM | POA: Diagnosis present

## 2021-08-12 DIAGNOSIS — I959 Hypotension, unspecified: Secondary | ICD-10-CM | POA: Diagnosis not present

## 2021-08-12 DIAGNOSIS — J449 Chronic obstructive pulmonary disease, unspecified: Secondary | ICD-10-CM | POA: Diagnosis present

## 2021-08-12 DIAGNOSIS — Z992 Dependence on renal dialysis: Secondary | ICD-10-CM | POA: Diagnosis not present

## 2021-08-12 DIAGNOSIS — Z8249 Family history of ischemic heart disease and other diseases of the circulatory system: Secondary | ICD-10-CM

## 2021-08-12 DIAGNOSIS — D631 Anemia in chronic kidney disease: Secondary | ICD-10-CM | POA: Diagnosis present

## 2021-08-12 DIAGNOSIS — G546 Phantom limb syndrome with pain: Secondary | ICD-10-CM | POA: Diagnosis present

## 2021-08-12 DIAGNOSIS — K5900 Constipation, unspecified: Secondary | ICD-10-CM | POA: Diagnosis not present

## 2021-08-12 DIAGNOSIS — Y830 Surgical operation with transplant of whole organ as the cause of abnormal reaction of the patient, or of later complication, without mention of misadventure at the time of the procedure: Secondary | ICD-10-CM | POA: Diagnosis present

## 2021-08-12 DIAGNOSIS — Z7982 Long term (current) use of aspirin: Secondary | ICD-10-CM | POA: Diagnosis not present

## 2021-08-12 DIAGNOSIS — Z79899 Other long term (current) drug therapy: Secondary | ICD-10-CM

## 2021-08-12 DIAGNOSIS — T8751 Necrosis of amputation stump, right upper extremity: Secondary | ICD-10-CM | POA: Diagnosis not present

## 2021-08-12 DIAGNOSIS — K649 Unspecified hemorrhoids: Secondary | ICD-10-CM | POA: Diagnosis present

## 2021-08-12 DIAGNOSIS — Z7901 Long term (current) use of anticoagulants: Secondary | ICD-10-CM

## 2021-08-12 DIAGNOSIS — R000 Tachycardia, unspecified: Secondary | ICD-10-CM | POA: Diagnosis not present

## 2021-08-12 MED ORDER — DIPHENHYDRAMINE HCL 12.5 MG/5ML PO ELIX
12.5000 mg | ORAL_SOLUTION | Freq: Four times a day (QID) | ORAL | Status: DC | PRN
  Administered 2021-08-16: 25 mg via ORAL
  Filled 2021-08-12: qty 10

## 2021-08-12 MED ORDER — DILTIAZEM HCL ER COATED BEADS 120 MG PO CP24: 120.0000 mg | ORAL_CAPSULE | Freq: Every day | ORAL | Status: DC

## 2021-08-12 MED ORDER — CHLORHEXIDINE GLUCONATE CLOTH 2 % EX PADS: 6.0000 | MEDICATED_PAD | Freq: Every day | CUTANEOUS | Status: DC

## 2021-08-12 MED ORDER — SEVELAMER CARBONATE 800 MG PO TABS
2400.0000 mg | ORAL_TABLET | Freq: Three times a day (TID) | ORAL | Status: DC
  Administered 2021-08-13 – 2021-08-23 (×30): 2400 mg via ORAL
  Filled 2021-08-12 (×32): qty 3

## 2021-08-12 MED ORDER — METOPROLOL TARTRATE 12.5 MG HALF TABLET
12.5000 mg | ORAL_TABLET | Freq: Two times a day (BID) | ORAL | Status: DC
  Administered 2021-08-12 – 2021-08-30 (×29): 12.5 mg via ORAL
  Filled 2021-08-12 (×33): qty 1

## 2021-08-12 MED ORDER — GABAPENTIN 100 MG PO CAPS: 100.0000 mg | ORAL_CAPSULE | Freq: Two times a day (BID) | ORAL | Status: DC

## 2021-08-12 MED ORDER — METOPROLOL TARTRATE 25 MG PO TABS: 12.5000 mg | ORAL_TABLET | Freq: Two times a day (BID) | ORAL | Status: DC

## 2021-08-12 MED ORDER — DILTIAZEM HCL ER COATED BEADS 180 MG PO CP24
180.0000 mg | ORAL_CAPSULE | Freq: Every day | ORAL | Status: DC
  Administered 2021-08-14 – 2021-08-25 (×8): 180 mg via ORAL
  Filled 2021-08-12 (×12): qty 1

## 2021-08-12 MED ORDER — POLYETHYLENE GLYCOL 3350 17 G PO PACK: 17.0000 g | PACK | Freq: Every day | ORAL | 0 refills | Status: DC | PRN

## 2021-08-12 MED ORDER — CINACALCET HCL 30 MG PO TABS
60.0000 mg | ORAL_TABLET | Freq: Every day | ORAL | Status: DC
  Administered 2021-08-12 – 2021-08-29 (×18): 60 mg via ORAL
  Filled 2021-08-12 (×21): qty 2

## 2021-08-12 MED ORDER — GUAIFENESIN-DM 100-10 MG/5ML PO SYRP: 5.0000 mL | ORAL_SOLUTION | Freq: Four times a day (QID) | ORAL | Status: DC | PRN

## 2021-08-12 MED ORDER — GABAPENTIN 100 MG PO CAPS
100.0000 mg | ORAL_CAPSULE | Freq: Two times a day (BID) | ORAL | Status: DC
  Administered 2021-08-12 – 2021-08-30 (×36): 100 mg via ORAL
  Filled 2021-08-12 (×36): qty 1

## 2021-08-12 MED ORDER — BISACODYL 10 MG RE SUPP: 10.0000 mg | Freq: Every day | RECTAL | Status: DC | PRN

## 2021-08-12 MED ORDER — CALCIUM CARBONATE ANTACID 500 MG PO CHEW: 1.0000 | CHEWABLE_TABLET | Freq: Four times a day (QID) | ORAL | Status: DC | PRN

## 2021-08-12 MED ORDER — ASPIRIN EC 81 MG PO TBEC
81.0000 mg | DELAYED_RELEASE_TABLET | Freq: Every day | ORAL | Status: DC
  Administered 2021-08-12 – 2021-08-30 (×19): 81 mg via ORAL
  Filled 2021-08-12 (×19): qty 1

## 2021-08-12 MED ORDER — HYDROCODONE-ACETAMINOPHEN 7.5-325 MG PO TABS
1.0000 | ORAL_TABLET | Freq: Three times a day (TID) | ORAL | Status: DC | PRN
  Administered 2021-08-12 (×2): 1 via ORAL
  Filled 2021-08-12 (×2): qty 1

## 2021-08-12 MED ORDER — METHOCARBAMOL 500 MG PO TABS: 500.0000 mg | ORAL_TABLET | Freq: Three times a day (TID) | ORAL | Status: DC | PRN

## 2021-08-12 MED ORDER — PANTOPRAZOLE SODIUM 40 MG PO TBEC
40.0000 mg | DELAYED_RELEASE_TABLET | Freq: Every day | ORAL | Status: DC
  Administered 2021-08-13 – 2021-08-30 (×18): 40 mg via ORAL
  Filled 2021-08-12 (×19): qty 1

## 2021-08-12 MED ORDER — DARBEPOETIN ALFA 60 MCG/0.3ML IJ SOSY
60.0000 ug | PREFILLED_SYRINGE | INTRAMUSCULAR | Status: DC
  Administered 2021-08-18: 60 ug via SUBCUTANEOUS
  Filled 2021-08-12 (×2): qty 0.3

## 2021-08-12 MED ORDER — SEVELAMER CARBONATE 800 MG PO TABS
1600.0000 mg | ORAL_TABLET | Freq: Three times a day (TID) | ORAL | Status: DC | PRN
  Filled 2021-08-12 (×3): qty 2

## 2021-08-12 MED ORDER — ENSURE ENLIVE PO LIQD: 237.0000 mL | Freq: Two times a day (BID) | ORAL | 12 refills | Status: DC

## 2021-08-12 MED ORDER — SENNOSIDES-DOCUSATE SODIUM 8.6-50 MG PO TABS: 2.0000 | ORAL_TABLET | Freq: Two times a day (BID) | ORAL | Status: DC

## 2021-08-12 MED ORDER — ALBUTEROL SULFATE (2.5 MG/3ML) 0.083% IN NEBU: 3.0000 mL | INHALATION_SOLUTION | Freq: Four times a day (QID) | RESPIRATORY_TRACT | Status: DC | PRN

## 2021-08-12 MED ORDER — ATORVASTATIN CALCIUM 10 MG PO TABS
10.0000 mg | ORAL_TABLET | Freq: Every day | ORAL | Status: DC
  Administered 2021-08-12 – 2021-08-29 (×18): 10 mg via ORAL
  Filled 2021-08-12 (×17): qty 1

## 2021-08-12 MED ORDER — SENNOSIDES-DOCUSATE SODIUM 8.6-50 MG PO TABS
1.0000 | ORAL_TABLET | Freq: Every evening | ORAL | Status: DC | PRN
  Administered 2021-08-20: 1 via ORAL

## 2021-08-12 MED ORDER — SENNOSIDES-DOCUSATE SODIUM 8.6-50 MG PO TABS
2.0000 | ORAL_TABLET | Freq: Two times a day (BID) | ORAL | Status: DC
  Administered 2021-08-12 – 2021-08-29 (×33): 2 via ORAL
  Filled 2021-08-12 (×34): qty 2

## 2021-08-12 MED ORDER — PROCHLORPERAZINE MALEATE 5 MG PO TABS
5.0000 mg | ORAL_TABLET | Freq: Four times a day (QID) | ORAL | Status: DC | PRN
  Administered 2021-08-30: 10 mg via ORAL
  Filled 2021-08-12: qty 2

## 2021-08-12 MED ORDER — PROCHLORPERAZINE 25 MG RE SUPP: 12.5000 mg | Freq: Four times a day (QID) | RECTAL | Status: DC | PRN

## 2021-08-12 MED ORDER — METHOCARBAMOL 500 MG PO TABS
500.0000 mg | ORAL_TABLET | Freq: Three times a day (TID) | ORAL | Status: DC | PRN
  Administered 2021-08-12 – 2021-08-13 (×2): 500 mg via ORAL
  Filled 2021-08-12 (×2): qty 1

## 2021-08-12 MED ORDER — PROCHLORPERAZINE EDISYLATE 10 MG/2ML IJ SOLN: 5.0000 mg | Freq: Four times a day (QID) | INTRAMUSCULAR | Status: DC | PRN

## 2021-08-12 MED ORDER — CHLORHEXIDINE GLUCONATE CLOTH 2 % EX PADS
6.0000 | MEDICATED_PAD | Freq: Once | CUTANEOUS | Status: AC
  Administered 2021-08-13: 6 via TOPICAL

## 2021-08-12 MED ORDER — APIXABAN 5 MG PO TABS
5.0000 mg | ORAL_TABLET | Freq: Two times a day (BID) | ORAL | Status: DC
  Administered 2021-08-12 – 2021-08-26 (×28): 5 mg via ORAL
  Filled 2021-08-12 (×28): qty 1

## 2021-08-12 MED ORDER — MILK AND MOLASSES ENEMA
1.0000 | RECTAL | Status: DC | PRN
  Filled 2021-08-12: qty 240

## 2021-08-12 MED ORDER — ACETAMINOPHEN 325 MG PO TABS
325.0000 mg | ORAL_TABLET | ORAL | Status: DC | PRN
  Administered 2021-08-12 – 2021-08-15 (×4): 650 mg via ORAL
  Administered 2021-08-16: 325 mg via ORAL
  Administered 2021-08-19 – 2021-08-27 (×3): 650 mg via ORAL
  Filled 2021-08-12 (×9): qty 2

## 2021-08-12 NOTE — Plan of Care (Signed)
°  Problem: Clinical Measurements: Goal: Ability to maintain clinical measurements within normal limits will improve Outcome: Adequate for Discharge Goal: Will remain free from infection Outcome: Adequate for Discharge Goal: Diagnostic test results will improve Outcome: Adequate for Discharge Goal: Respiratory complications will improve Outcome: Adequate for Discharge Goal: Cardiovascular complication will be avoided Outcome: Adequate for Discharge   Problem: Safety: Goal: Ability to remain free from injury will improve Outcome: Adequate for Discharge   Problem: Education: Goal: Knowledge of General Education information will improve Description: Including pain rating scale, medication(s)/side effects and non-pharmacologic comfort measures Outcome: Adequate for Discharge   Problem: Health Behavior/Discharge Planning: Goal: Ability to manage health-related needs will improve Outcome: Adequate for Discharge   Problem: Clinical Measurements: Goal: Ability to maintain clinical measurements within normal limits will improve Outcome: Adequate for Discharge Goal: Will remain free from infection Outcome: Adequate for Discharge Goal: Diagnostic test results will improve Outcome: Adequate for Discharge Goal: Respiratory complications will improve Outcome: Adequate for Discharge Goal: Cardiovascular complication will be avoided Outcome: Adequate for Discharge   Problem: Activity: Goal: Risk for activity intolerance will decrease Outcome: Adequate for Discharge   Problem: Nutrition: Goal: Adequate nutrition will be maintained Outcome: Adequate for Discharge   Problem: Coping: Goal: Level of anxiety will decrease Outcome: Adequate for Discharge   Problem: Elimination: Goal: Will not experience complications related to bowel motility Outcome: Adequate for Discharge Goal: Will not experience complications related to urinary retention Outcome: Adequate for Discharge   Problem:  Pain Managment: Goal: General experience of comfort will improve Outcome: Adequate for Discharge   Problem: Safety: Goal: Ability to remain free from injury will improve Outcome: Adequate for Discharge   Problem: Skin Integrity: Goal: Risk for impaired skin integrity will decrease Outcome: Adequate for Discharge

## 2021-08-12 NOTE — Progress Notes (Signed)
[facility]  ROUNDING NOTE   Subjective:   Scott Castro is a 66 year old male with a past medical history of Barrett's esophagus, hyperparathyroidism, and end-stage renal disease on dialysis.  Patient reports to the emergency department with complaints of leg pain and swelling.  The patient has been admitted for Limb ischemia [I99.8] Ischemic foot ulcer due to atherosclerosis of native artery of limb ([place]) [I70.25, L97.509]  Patient is known to our practice through previous hospitalizations.  Patient receives outpatient dialysis treatments at [facility], supervised by [facility] physicians.    Patient sitting up in bed Pain well managed on prescribed medications Dialysis yesterday, tolerated well Denies shortness of breath, nausea and vomiting   Objective:  Vital signs in last 24 hours:  Temp:  [98.5 F (36.9 C)-99.8 F (37.7 C)] 99.1 F (37.3 C) (01/04 0734) Pulse Rate:  [89-121] 93 (01/04 0734) Resp:  [15-22] 17 (01/04 0734) BP: (81-140)/(58-84) 97/72 (01/04 0734) SpO2:  [96 %-100 %] 100 % (01/04 0734) Weight:  [68.4 kg] 68.4 kg (01/03 1300)  Weight change:  Filed Weights   08/08/21 1340 08/11/21 0915 08/11/21 1300  Weight: 74.2 kg 71.1 kg 68.4 kg    Intake/Output: I/O last 3 completed shifts: In: 240 [Other:240] Out: 1001 [Other:1001]   Intake/Output this shift:  Total I/O In: 180 [P.O.:180] Out: -   Physical Exam: General: NAD, resting on bed  Head: Normocephalic, atraumatic. Moist oral mucosal membranes  Eyes: Anicteric  Lungs:  Clear to auscultation, normal effort, O2 via [place]  Heart: Irregular rate and rhythm, crescendo murmur  Abdomen:  Soft, nontender  Extremities: No peripheral edema.right BKA  Neurologic: Nonfocal, moving all four extremities     Access: LUE AVG     Basic Metabolic Panel: Recent Labs  Lab 08/05/21 1440 08/05/21 2258 08/06/21 1725 08/08/21 0516 08/09/21 0538  NA 138  --  139 136 132*  K 5.1  --  4.3 5.1 4.8  CL  98  --  98 97* 94*  CO2 28  --  27 27 27   GLUCOSE 98  --  69* 91 86  BUN 40*  --  22 44* 32*  CREATININE 7.58* 7.69* 5.19* 7.64* 6.25*  CALCIUM 8.4*  --  8.4* 7.5* 7.3*  MG  --   --  1.8  --   --   PHOS  --   --  4.7*  --  5.0*     Liver Function Tests: Recent Labs  Lab 08/05/21 1440 08/06/21 1725 08/09/21 0538  AST 14*  --   --   ALT 9  --   --   ALKPHOS 92  --   --   BILITOT 0.7  --   --   PROT 7.6  --   --   ALBUMIN 2.8* 2.7* 2.3*    No results for input(s): LIPASE, AMYLASE in the last 168 hours. No results for input(s): AMMONIA in the last 168 hours.  CBC: Recent Labs  Lab 08/05/21 1440 08/05/21 2258 08/06/21 1225 08/08/21 0516 08/09/21 0538  WBC 7.8 6.8 5.5 7.7 8.3  NEUTROABS 5.9  --  3.3  --  5.9  HGB 10.5* 9.1* 9.1* 8.2* 8.5*  HCT 33.9* 29.4* 29.1* 26.0* 27.7*  MCV 103.0* 102.1* 100.3* 102.4* 101.8*  PLT 283 248 255 199 176     Cardiac Enzymes: No results for input(s): CKTOTAL, CKMB, CKMBINDEX, TROPONINI in the last 168 hours.  BNP: Invalid input(s): POCBNP  CBG: Recent Labs  Lab 08/07/21 2155  GLUCAP 117*     Microbiology: Results for orders placed or performed during the hospital encounter of 08/05/21  Culture, blood (routine x 2)     Status: None   Collection Time: 08/05/21  2:40 PM   Specimen: BLOOD  Result Value Ref Range Status   Specimen Description BLOOD RIGHT ANTECUBITAL  Final   Special Requests   Final    BOTTLES DRAWN AEROBIC AND ANAEROBIC Blood Culture results may not be optimal due to an inadequate volume of blood received in culture bottles   Culture   Final    NO GROWTH 6 DAYS Performed at [facility], [address], [place], [place] [address]    Report Status 08/11/2021 FINAL  Final  Culture, blood (routine x 2)     Status: None   Collection Time: 08/05/21  3:30 PM   Specimen: BLOOD  Result Value Ref Range Status   Specimen Description BLOOD BLOOD RIGHT HAND  Final   Special Requests   Final    BOTTLES  DRAWN AEROBIC AND ANAEROBIC Blood Culture adequate volume   Culture   Final    NO GROWTH 6 DAYS Performed at [facility], [address] [place], [place] [address]    Report Status 08/11/2021 FINAL  Final  Resp Panel by RT-PCR (Flu A&B, Covid) Nasopharyngeal Swab     Status: None   Collection Time: 08/05/21  6:36 PM   Specimen: Nasopharyngeal Swab; Nasopharyngeal(NP) swabs in vial transport medium  Result Value Ref Range Status   SARS Coronavirus 2 by RT PCR NEGATIVE NEGATIVE Final    Comment: (NOTE) SARS-CoV-2 target nucleic acids are NOT DETECTED.  The SARS-CoV-2 RNA is generally detectable in upper respiratory specimens during the acute phase of infection. The lowest concentration of SARS-CoV-2 viral copies this assay can detect is 138 copies/mL. A negative result does not preclude SARS-Cov-2 infection and should not be used as the sole basis for treatment or other patient management decisions. A negative result may occur with  improper specimen collection/handling, submission of specimen other than nasopharyngeal swab, presence of viral mutation(s) within the areas targeted by this assay, and inadequate number of viral copies(<138 copies/mL). A negative result must be combined with clinical observations, patient history, and epidemiological information. The expected result is Negative.  Fact Sheet for Patients:  EntrepreneurPulse.com.au  Fact Sheet for Healthcare Providers:  IncredibleEmployment.be  This test is no t yet approved or cleared by the [place] FDA and  has been authorized for detection and/or diagnosis of SARS-CoV-2 by FDA under an Emergency Use Authorization (EUA). This EUA will remain  in effect (meaning this test can be used) for the duration of the COVID-19 declaration under Section 564(b)(1) of the Act, 21 U.S.C.section 360bbb-3(b)(1), unless the authorization is terminated  or revoked sooner.        Influenza A by PCR NEGATIVE NEGATIVE Final   Influenza B by PCR NEGATIVE NEGATIVE Final    Comment: (NOTE) The Xpert Xpress SARS-CoV-2/FLU/RSV plus assay is intended as an aid in the diagnosis of influenza from Nasopharyngeal swab specimens and should not be used as a sole basis for treatment. Nasal washings and aspirates are unacceptable for Xpert Xpress SARS-CoV-2/FLU/RSV testing.  Fact Sheet for Patients: EntrepreneurPulse.com.au  Fact Sheet for Healthcare Providers: IncredibleEmployment.be  This test is not yet approved or cleared by the [place] FDA and has been authorized for detection and/or diagnosis of SARS-CoV-2 by FDA under an Emergency Use Authorization (EUA). This EUA will remain in effect (  meaning this test can be used) for the duration of the COVID-19 declaration under Section 564(b)(1) of the Act, 21 U.S.C. section 360bbb-3(b)(1), unless the authorization is terminated or revoked.  Performed at [facility], [address] [place], [place] [address]   Surgical PCR screen     Status: None   Collection Time: 08/06/21 11:23 PM   Specimen: Nasal Mucosa; Nasal Swab  Result Value Ref Range Status   MRSA, PCR NEGATIVE NEGATIVE Final   Staphylococcus aureus NEGATIVE NEGATIVE Final    Comment: (NOTE) The Xpert SA Assay (FDA approved for NASAL specimens in patients 17 years of age and older), is one component of a comprehensive surveillance program. It is not intended to diagnose infection nor to guide or monitor treatment. Performed at [facility], [address] [place], [place] [address]     Coagulation Studies: No results for input(s): LABPROT, INR in the last 72 hours.   Urinalysis: No results for input(s): COLORURINE, LABSPEC, PHURINE, GLUCOSEU, HGBUR, BILIRUBINUR, KETONESUR, PROTEINUR, UROBILINOGEN, NITRITE, LEUKOCYTESUR in the last 72 hours.  Invalid input(s): APPERANCEUR    Imaging: No  results found.   Medications:      apixaban  5 mg Oral BID   atorvastatin  10 mg Oral QHS   cinacalcet  60 mg Oral Q supper   diltiazem  180 mg Oral Daily   epoetin (EPOGEN/PROCRIT) injection  8,000 Units Intravenous Q T,Th,Sa-HD   feeding supplement  237 mL Oral BID BM   gabapentin  100 mg Oral BID   metaxalone  400 mg Oral TID   metoprolol tartrate  12.5 mg Oral BID   pantoprazole  40 mg Oral Daily   polyethylene glycol  17 g Oral BID   senna-docusate  2 tablet Oral BID   sevelamer carbonate  2,400 mg Oral TID WC   albuterol, HYDROcodone-acetaminophen, methocarbamol  Assessment/ Plan:  Scott Castro is a 66 y.o.  male with a past medical history of Barrett's esophagus, hyperparathyroidism, and end-stage renal disease on dialysis.  Patient reports to the emergency department with complaints of leg pain and swelling.  The patient has been admitted for Limb ischemia [I99.8] Ischemic foot ulcer due to atherosclerosis of native artery of limb ([place]) [I70.25, L97.509]  [facility]/TTS/LUE AVG/71kg  End-stage renal disease on dialysis.  Patient is on Tuesday Thursday Saturday schedule as an outpatient We will maintain outpatient schedule during this admission, if possible.  Received dialysis yesterday, UF goal 1L achieved. Next treatment scheduled for Thursday.   2. Anemia of chronic kidney disease  Lab Results  Component Value Date   HGB 8.5 (L) 08/09/2021    Mircera prescribed biweekly outpatient.  Last dose 07/29/2021.  Continue Epogen 8000 units with treatments  3. Secondary Hyperparathyroidism:  Lab Results  Component Value Date   PTH 93 (H) 04/09/2019   CALCIUM 7.3 (L) 08/09/2021   PHOS 5.0 (H) 08/09/2021  Calcitriol also prescribed outpatient Continue sevelamer with meals  4.  Ischemic foot ulcer.   Patient is now s/p BKA  Vascular team and primary team are following and have cleared patient for discharge to rehab  5.Hyponatremia Likely  secondary to pain and ESRD Will follow and correct with dialysis    LOS: 7 [place] 1/4/202312:14 PM

## 2021-08-12 NOTE — TOC Progression Note (Signed)
Transition of Care [facility]) - Progression Note    Patient Details  Name: Scott Castro MRN: [id number] Date of Birth: 1956/08/09  Transition of Care [facility]) CM/SW [place], RN Phone Number: 08/12/2021, 9:57 AM  Clinical Narrative:   TOC continues to follow the patient, I reached out to [facility] to inquire about the DC plan and is they are accepting the patient and when, They stated that they have a bed for him when he is ready to DC, I reached out to the physician to inquire, awaiting a response    Expected Discharge Plan: [place] Barriers to Discharge: Continued Medical Work up  Expected Discharge Plan and Services Expected Discharge Plan: [place] arrangements for the past 2 months: Single Family Home                                       Social Determinants of Health (SDOH) Interventions    Readmission Risk Interventions Readmission Risk Prevention Plan 07/22/2021 04/10/2019  Transportation Screening Complete Complete  PCP or Specialist Appt within 3-5 Days Complete Complete  HRI or Home Care Consult Complete Complete  Social Work Consult for [place] Planning/Counseling Complete Complete  Palliative Care Screening Not Applicable -  Medication Review Press photographer) Complete Complete  Some recent data might be hidden

## 2021-08-12 NOTE — Consult Note (Signed)
Reason for Consult: ESRD Referring Physician: Dr. Dagoberto Ligas  Chief Complaint: leg pain  Dialysis at [facility] with [facility] EDW 68.4kg? EDW before the amputation was 72kg   Elisio 16  Assessment/Plan: ESRD - TTS at [facility] w/ [facility] last HD treatment on 12/31 -> 1/3 post dialysis weight 68.4 kg Renal osteodystrophy - last Phos 1/1 was 5; will recheck Saturday and adjust binders if needed. [name] 3 tabs TIDM. [name] 60mg  qdaily. Anemia - will dose ESA on Aranesp 58mcg qTues; 10% sats on 1/1.He has not received IV iron. Given he's already had the BKA will dose with Venofer.  PAD with critical limb ischemia s/p rt BKA POD#5 for gangrene PAF - rate controlled on Eliquis GERD COPD   HPI: Scott Castro is an 66 y.o. male PMH COPD Barrett's, hyperparathyroidism, ESRD, failed renal transplant, PAF on Eliquis initially presenting to the ED with leg pain and swelling noted to have an ischemic foot ulcer. He recently had angioplasty to the right AT and tibial peroneal trunk and popliteal artery but unfortunately he continued to progress with  worsening redness, pain and blisters to the right foot and toes occurring over a few days prior to hospitalization. POD #5 s/p rt BKA on 12/30.  Patient receives his dialysis treatments at [place] with [facility].    ROS Pertinent items are noted in HPI.  Chemistry and CBC: Creatinine  Date/Time Value Ref Range Status  06/10/2012 09:24 AM 10.29 (H) 0.60 - 1.30 mg/dL Final  06/07/2012 05:02 AM 8.51 (H) 0.60 - 1.30 mg/dL Final  06/06/2012 10:46 AM 13.84 (H) 0.60 - 1.30 mg/dL Final   Creatinine, Ser  Date/Time Value Ref Range Status  08/09/2021 05:38 AM 6.25 (H) 0.61 - 1.24 mg/dL Final  08/08/2021 05:16 AM 7.64 (H) 0.61 - 1.24 mg/dL Final  08/06/2021 05:25 PM 5.19 (H) 0.61 - 1.24 mg/dL Final  08/05/2021 10:58 PM 7.69 (H) 0.61 - 1.24 mg/dL Final  08/05/2021 02:40 PM 7.58 (H) 0.61 - 1.24 mg/dL Final  07/23/2021 03:37 AM 8.25 (H) 0.61  - 1.24 mg/dL Final  07/22/2021 07:08 AM 6.59 (H) 0.61 - 1.24 mg/dL Final  07/21/2021 05:39 AM 8.85 (H) 0.61 - 1.24 mg/dL Final  07/20/2021 06:58 AM 7.45 (H) 0.61 - 1.24 mg/dL Final  07/19/2021 07:37 AM 5.26 (H) 0.61 - 1.24 mg/dL Final  07/18/2021 04:22 PM 3.74 (H) 0.61 - 1.24 mg/dL Final  07/08/2021 02:53 AM 7.69 (H) 0.61 - 1.24 mg/dL Final  07/07/2021 04:35 AM 10.15 (H) 0.61 - 1.24 mg/dL Final  07/06/2021 06:10 AM 8.67 (H) 0.61 - 1.24 mg/dL Final  07/05/2021 06:11 AM 7.37 (H) 0.61 - 1.24 mg/dL Final  07/04/2021 06:41 AM 10.59 (H) 0.61 - 1.24 mg/dL Final  07/03/2021 04:23 PM 9.56 (H) 0.61 - 1.24 mg/dL Final  12/14/2020 04:43 AM 6.55 (H) 0.61 - 1.24 mg/dL Final  12/13/2020 04:12 AM 8.91 (H) 0.61 - 1.24 mg/dL Final  12/12/2020 11:32 AM 6.97 (H) 0.61 - 1.24 mg/dL Final  09/18/2019 04:30 AM 7.01 (H) 0.61 - 1.24 mg/dL Final  09/17/2019 05:23 PM 9.48 (H) 0.61 - 1.24 mg/dL Final  04/09/2019 08:22 AM 8.16 (H) 0.61 - 1.24 mg/dL Final  04/09/2019 01:55 AM 7.70 (H) 0.61 - 1.24 mg/dL Final  03/20/2016 02:34 PM 6.28 (H) 0.61 - 1.24 mg/dL Final   Recent Labs  Lab 08/05/21 1440 08/05/21 2258 08/06/21 1725 08/08/21 0516 08/09/21 0538  NA 138  --  139 136 132*  K 5.1  --  4.3 5.1  4.8  CL 98  --  98 97* 94*  CO2 28  --  27 27 27   GLUCOSE 98  --  69* 91 86  BUN 40*  --  22 44* 32*  CREATININE 7.58* 7.69* 5.19* 7.64* 6.25*  CALCIUM 8.4*  --  8.4* 7.5* 7.3*  PHOS  --   --  4.7*  --  5.0*   Recent Labs  Lab 08/05/21 1440 08/05/21 2258 08/06/21 1225 08/08/21 0516 08/09/21 0538  WBC 7.8 6.8 5.5 7.7 8.3  NEUTROABS 5.9  --  3.3  --  5.9  HGB 10.5* 9.1* 9.1* 8.2* 8.5*  HCT 33.9* 29.4* 29.1* 26.0* 27.7*  MCV 103.0* 102.1* 100.3* 102.4* 101.8*  PLT 283 248 255 199 176   Liver Function Tests: Recent Labs  Lab 08/05/21 1440 08/06/21 1725 08/09/21 0538  AST 14*  --   --   ALT 9  --   --   ALKPHOS 92  --   --   BILITOT 0.7  --   --   PROT 7.6  --   --   ALBUMIN 2.8* 2.7* 2.3*   No  results for input(s): LIPASE, AMYLASE in the last 168 hours. No results for input(s): AMMONIA in the last 168 hours. Cardiac Enzymes: No results for input(s): CKTOTAL, CKMB, CKMBINDEX, TROPONINI in the last 168 hours. Iron Studies: No results for input(s): IRON, TIBC, TRANSFERRIN, FERRITIN in the last 72 hours. PT/INR: @LABRCNTIP (inr:5)  Xrays/Other Studies: )No results found for this or any previous visit (from the past 48 hour(s)). No results found.  PMH:   Past Medical History:  Diagnosis Date   Barrett's esophagus    Chronic kidney disease    CHRONIC\   Glomerulosclerosis, focal    Hyperparathyroidism due to renal insufficiency (HCC)     PSH:   Past Surgical History:  Procedure Laterality Date   AMPUTATION Right 08/07/2021   Procedure: AMPUTATION BELOW KNEE;  Surgeon: Algernon Huxley, MD;  Location: [facility] ORS;  Service: Vascular;  Laterality: Right;   AV FISTULA PLACEMENT     COLONOSCOPY N/A 03/22/2016   Procedure: COLONOSCOPY;  Surgeon: Manya Silvas, MD;  Location: [facility] ENDOSCOPY;  Service: Endoscopy;  Laterality: N/A;   DG AV DIALYSIS GRAFT DECLOT OR     ESOPHAGOGASTRODUODENOSCOPY (EGD) WITH PROPOFOL  03/22/2016   Procedure: ESOPHAGOGASTRODUODENOSCOPY (EGD) WITH PROPOFOL;  Surgeon: Manya Silvas, MD;  Location: [facility] ENDOSCOPY;  Service: Endoscopy;;   FLEXIBLE BRONCHOSCOPY     KIDNEY TRANSPLANT Right 1985   LOWER EXTREMITY ANGIOGRAPHY Right 07/20/2021   Procedure: Lower Extremity Angiography;  Surgeon: Algernon Huxley, MD;  Location: [place] CV LAB;  Service: Cardiovascular;  Laterality: Right;   LOWER EXTREMITY ANGIOGRAPHY Right 08/06/2021   Procedure: Lower Extremity Angiography;  Surgeon: Algernon Huxley, MD;  Location: [place] CV LAB;  Service: Cardiovascular;  Laterality: Right;   REMOVAL TENCKHOFF CATH      Allergies: No Known Allergies  Medications:   Prior to Admission medications   Medication Sig Start Date End Date Taking? Authorizing Provider   albuterol (VENTOLIN HFA) 108 (90 Base) MCG/ACT inhaler Inhale 2 puffs into the lungs every 6 (six) hours as needed for wheezing or shortness of breath. 07/08/21   Enzo Bi, MD  apixaban (ELIQUIS) 5 MG TABS tablet Take 1 tablet (5 mg total) by mouth 2 (two) times daily. 07/22/21   Sharen Hones, MD  atorvastatin (LIPITOR) 10 MG tablet Take 1 tablet (10 mg total) by mouth at bedtime. 07/22/21  Sharen Hones, MD  cinacalcet (SENSIPAR) 60 MG tablet Take 60 mg by mouth daily.    [provider]  diclofenac Sodium (VOLTAREN) 1 % GEL Apply 2 g topically 4 (four) times daily. Use on feet    [provider]  diltiazem (CARDIZEM CD) 120 MG 24 hr capsule Take 1 capsule (120 mg total) by mouth daily. Hold for sbp < 100 08/12/21   Antonieta Pert, MD  feeding supplement (ENSURE ENLIVE / ENSURE PLUS) LIQD Take 237 mLs by mouth 2 (two) times daily between meals. 08/12/21   Antonieta Pert, MD  gabapentin (NEURONTIN) 100 MG capsule Take 1 capsule (100 mg total) by mouth 2 (two) times daily. 08/12/21   Antonieta Pert, MD  HYDROcodone-acetaminophen (NORCO) 7.5-325 MG tablet Take 1 tablet by mouth 3 (three) times daily as needed. 08/05/21   [provider]  methocarbamol (ROBAXIN) 500 MG tablet Take 1 tablet (500 mg total) by mouth every 8 (eight) hours as needed for muscle spasms. 08/12/21   Antonieta Pert, MD  metoprolol tartrate (LOPRESSOR) 25 MG tablet Take 0.5 tablets (12.5 mg total) by mouth 2 (two) times daily. 08/12/21   Antonieta Pert, MD  pantoprazole (PROTONIX) 40 MG tablet Take 40 mg by mouth daily. 03/27/19   [provider]  polyethylene glycol (MIRALAX / GLYCOLAX) 17 g packet Take 17 g by mouth daily as needed. 08/12/21   Antonieta Pert, MD  senna-docusate (SENOKOT-S) 8.6-50 MG tablet Take 2 tablets by mouth 2 (two) times daily. 08/12/21   Antonieta Pert, MD  sevelamer carbonate (RENVELA) 800 MG tablet Take by mouth See admin instructions. Take 2400 mg by mouth three times daily, 1600 mg with snacks    [provider]    Discontinued Meds:  There are no discontinued medications.  Social History:  reports that he has never smoked. He has never used smokeless tobacco. He reports that he does not currently use alcohol. He reports that he does not use drugs.  Family History:   Family History  Problem Relation Age of Onset   Heart disease Mother    Heart disease Father     Blood pressure 97/71, pulse 90, temperature 98.5 F (36.9 C), resp. rate 16, height 5\' 9"  (1.753 m), weight 74.6 kg, SpO2 100 %. GEN: NAD, A&Ox3, NCAT HEENT: No conjunctival pallor, EOMI NECK: Supple, no thyromegaly LUNGS: CTA B/L no rales, rhonchi or wheezing CV: irregular irregular, crescendo murmur ABD: SNDNT +BS  EXT: right BKA ACCESS: Lt BBT with stents along outflow        Maliki Gignac, Hunt Oris, MD 08/12/2021, 2:20 PM

## 2021-08-12 NOTE — Progress Notes (Signed)
Inpatient Rehab Admissions Coordinator:  ? ?I have a bed for this Pt. On CIR today. RN may call report to [phone]. ? ?Nyasha Rahilly, MS, CCC-SLP ?Rehab Admissions Coordinator  ?[phone] (celll) ?[phone] (office) ?

## 2021-08-12 NOTE — H&P (Incomplete)
Physical Medicine and Rehabilitation Admission H&P    Chief Complaint  Patient presents with   Foot Pain   Leg Swelling  : HPI: Scott Castro is a 66 year old male with a history of peripheral vascular disease followed by Dr. Daylene Katayama for ongoing right foot pain. He initially presented to [facility] emergency department on 07/18/2021 with intractable right foot pain. ABIs performed on 07/14/2021 showed 0 right great toe pressure and an ABI of 0.5. He was admitted and started on heparin infusion for criticial limb ischemia. A vascular surgery consultation was obtained by Dr. Trula Slade and he underwent arteriogram with intervention on 07/20/2021 by Dr. Lucky Cowboy. He underwent right TP trunk, proximal peroneal and right popliteal artery angioplasty. A cardiology consultation was obtained on 07/19/2021 for history of atrial fibrillation and elevated heart rate to 144 bpm. He was started on Cardizem infusion. He had no chest pain and blood pressure was soft so metoprolol and enalapril were held.  History of ESRD on chronic iHD on Tuesdays, Thursdays and Saturdays followed by [facility] nephrology, Dr. Radene Knee.  He has a left upper arm arteriovenous fistula.  Nephrology was consulted for routine dialysis orders.  The heparin infusion was discontinued and he was started on Plavix and statin.  Aspirin therapy was deferred due to initiation of apixaban for his atrial fibrillation.  This was initiated on 12/13.  His metoprolol was continued that day as well.  [name] infusion was discontinued and he was ready for discharge home on 12/15 with these medications as well as diltiazem.  He represented to [facility] emergency department on 07/2821 with persistent right foot pain and right calf swelling.  On physical examination he had acute ischemic changes to his right great toe and second toes.  There was swelling and erythema of the dorsum of the right foot.  Heparin infusion was started. He was admitted and vascular surgery re-consulted.   His foot was ruborous and somewhat swollen on exam nation.  Cool to touch and no palpable pulses.  He underwent arteriogram on 12/29 by Dr. Lucky Cowboy with right TP trunk and posterior tibial artery angioplasty as well as stent placement to the right external iliac artery.  Right lower extremity venous Doppler ultrasound negative for DVT.  On 12/30, he was taken to the operating room and underwent right below the knee amputation by Dr. Lucky Cowboy.  His heparin infusion continued postoperatively.  This was discontinued on 1231 and his Eliquis was resumed.  Significant past medical history: Admitted on 07/03/2021 with SOB and treated for Covid-19 related pneumonia with history of history of chronic obstructive pulmonary disease/asthma.  Currently on 3 L oxygen supplement mentation via nasal cannula.  He does not use home oxygen.  He underwent CT scan of the chest on 12/21 with findings of loculated left pleural effusion, subpleural scarring noted along the peripheral right chest, increase in size of chest lymph nodes as compared to CT on 11/26/2020.  14-month follow-up suggested.  SARS corona virus PCR -12/10 and 12/28. History of Barrett's esophagus, renal osteodystrophy, cardiomegaly, secondary hyperparathyroidism. Outpatient dialysis treatments at [facility], supervised by [facility] physicians.  Past surgical history: Prior kidney transplant approximately 1985.  Kidney failed in the early 2000's.  He underwent peritoneal dialysis catheter placement and peritoneal dialysis.  Social history: Patient denies tobacco use present or prior.  He lives alone in single story home.  Review of Systems  Constitutional:  Negative for chills and fever.  Cardiovascular:  Positive for leg swelling. Negative for  chest pain.  Gastrointestinal:  Negative for nausea and vomiting.  Genitourinary:        Anuric  Musculoskeletal:        Mild to moderate right residual limb postoperative pain.  He states his right knee has had a mild  chronic flexion contracture.  Past Medical History:  Diagnosis Date   Barrett's esophagus    Chronic kidney disease    CHRONIC\   Glomerulosclerosis, focal    Hyperparathyroidism due to renal insufficiency [facility])    Past Surgical History:  Procedure Laterality Date   AMPUTATION Right 08/07/2021   Procedure: AMPUTATION BELOW KNEE;  Surgeon: Algernon Huxley, MD;  Location: [facility] ORS;  Service: Vascular;  Laterality: Right;   AV FISTULA PLACEMENT     COLONOSCOPY N/A 03/22/2016   Procedure: COLONOSCOPY;  Surgeon: Manya Silvas, MD;  Location: [facility] ENDOSCOPY;  Service: Endoscopy;  Laterality: N/A;   DG AV DIALYSIS GRAFT DECLOT OR     ESOPHAGOGASTRODUODENOSCOPY (EGD) WITH PROPOFOL  03/22/2016   Procedure: ESOPHAGOGASTRODUODENOSCOPY (EGD) WITH PROPOFOL;  Surgeon: Manya Silvas, MD;  Location: [facility] ENDOSCOPY;  Service: Endoscopy;;   FLEXIBLE BRONCHOSCOPY     KIDNEY TRANSPLANT Right 1985   LOWER EXTREMITY ANGIOGRAPHY Right 07/20/2021   Procedure: Lower Extremity Angiography;  Surgeon: Algernon Huxley, MD;  Location: [place] CV LAB;  Service: Cardiovascular;  Laterality: Right;   LOWER EXTREMITY ANGIOGRAPHY Right 08/06/2021   Procedure: Lower Extremity Angiography;  Surgeon: Algernon Huxley, MD;  Location: [place] CV LAB;  Service: Cardiovascular;  Laterality: Right;   REMOVAL TENCKHOFF CATH     Family History  Problem Relation Age of Onset   Heart disease Mother    Heart disease Father    Allergies: No Known Allergies Medications Prior to Admission  Medication Sig Dispense Refill   albuterol (VENTOLIN HFA) 108 (90 Base) MCG/ACT inhaler Inhale 2 puffs into the lungs every 6 (six) hours as needed for wheezing or shortness of breath.     apixaban (ELIQUIS) 5 MG TABS tablet Take 1 tablet (5 mg total) by mouth 2 (two) times daily. 60 tablet 0   atorvastatin (LIPITOR) 10 MG tablet Take 1 tablet (10 mg total) by mouth at bedtime. 30 tablet 0   cinacalcet (SENSIPAR) 60 MG tablet Take 60 mg  by mouth daily.     HYDROcodone-acetaminophen (NORCO) 7.5-325 MG tablet Take 1 tablet by mouth 3 (three) times daily as needed.     metoprolol (LOPRESSOR) 100 MG tablet Take 100 mg by mouth 2 (two) times daily.     pantoprazole (PROTONIX) 40 MG tablet Take 40 mg by mouth daily.     sevelamer carbonate (RENVELA) 800 MG tablet Take by mouth See admin instructions. Take 2400 mg by mouth three times daily, 1600 mg with snacks     clopidogrel (PLAVIX) 75 MG tablet Take 1 tablet (75 mg total) by mouth daily. 30 tablet 0   diclofenac Sodium (VOLTAREN) 1 % GEL Apply 2 g topically 4 (four) times daily. Use on feet     diltiazem (CARDIZEM CD) 180 MG 24 hr capsule Take 1 capsule (180 mg total) by mouth daily. (Patient not taking: Reported on 08/05/2021) 30 capsule 0    Drug Regimen Review  Drug regimen was reviewed and remains appropriate with no significant issues identified  Home: Home Living Family/patient expects to be discharged to:: Private residence Living Arrangements: Alone Available Help at Discharge: Friend(s), Neighbor, Available PRN/intermittently Type of Home: House Home Access: Stairs to enter Entrance  Stairs-Number of Steps: 1 Entrance Stairs-Rails: None Home Layout: One level Bathroom Shower/Tub: Chiropodist: Standard Bathroom Accessibility: Yes Home Equipment: None   Functional History: Prior Function Prior Level of Function : Independent/Modified Independent Mobility Comments: Independent, 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[employer], driving  Functional Status:  Mobility: Bed Mobility Overal bed mobility: Needs Assistance Bed Mobility: Supine to Sit Supine to sit: Min assist Sit to supine: Min assist, HOB elevated General bed mobility comments: assist for R residual limb; increased effort/time to perform on own Transfers Overall transfer level: Needs assistance Equipment used: Rolling walker (2 wheels) Transfers: Sit to/from Stand Sit to Stand: Mod  assist Bed to/from chair/wheelchair/BSC transfer type:: Stand pivot Stand pivot transfers: Mod assist  Lateral/Scoot Transfers: Min assist General transfer comment: Pt able to attain full upright standing posture at RW. Ambulation/Gait General Gait Details: not appropriate at this time    ADL: ADL Overall ADL's : Needs assistance/impaired General ADL Comments: MOD A + RW for bed>chair t/f, unable to tolerate prolonged standing to trial standing grooming tasks. SETUP + SUPERVISION seated grooming.  Cognition: Cognition Overall Cognitive Status: Within Functional Limits for tasks assessed Orientation Level: Oriented X4 Cognition Arousal/Alertness: Awake/alert Behavior During Therapy: WFL for tasks assessed/performed Overall Cognitive Status: Within Functional Limits for tasks assessed General Comments: Pt feeling a little more optomistic today  Physical Exam: Blood pressure 97/72, pulse 93, temperature 99.1 F (37.3 C), resp. rate 17, height 5\' 9"  (1.753 m), weight 68.4 kg, SpO2 100 %. Physical Exam Constitutional:      Comments: Chronically ill-appearing male, thin with muscle wasting.  HENT:     Mouth/Throat:     Mouth: Mucous membranes are dry.  Eyes:     Pupils: Pupils are equal, round, and reactive to light.  Cardiovascular:     Rate and Rhythm: Normal rate and regular rhythm.  Pulmonary:     Effort: Pulmonary effort is normal.     Breath sounds: Normal breath sounds.     Comments: Increased AP chest diameter Abdominal:     General: Abdomen is flat. Bowel sounds are normal.     Palpations: Abdomen is soft.  Musculoskeletal:     Left lower leg: Edema present.     Comments: Right BKA incision is well approximated.  Flaps are warm and well perfused.  Left foot sensorimotor intact.  No ischemic skin changes.    No results found for this or any previous visit (from the past 48 hour(s)). No results found.     Medical Problem List and Plan: 1. Functional deficits  secondary to recent right below the knee amputation secondary to critical limb ischemia, peripheral arterial disease, severe right lower extremity tibial artery disease.  -patient may *** shower  -ELOS/Goals: *** 2.  Antithrombotics: -DVT/anticoagulation:  Mechanical: Sequential compression devices, below knee Left lower extremity Pharmaceutical: Other (comment) apixaban  -antiplatelet therapy: aspirin  3. Pain Management: Hydrocodone 4. Mood: LCSW to evaluate and provide emotional support  -antipsychotic agents: Not applicable 5. Neuropsych: This patient is capable of making decisions on his own behalf. 6. Skin/Wound Care: Routine skin checks.  Routine incisional care.  Dry gauze and Ace wrap to right residual limb. 7. Fluids/Electrolytes/Nutrition: Fluid restriction, monitor intake and daily weight 8.  End-stage renal disease on chronic intermittent hemodialysis.  T/T/S --Consult nephrology 9: Atrial fibrillation: Continue Lopressor, Eliquis 10: Hyperlipidemia: Continue Lipitor 11: COPD/asthma: albuterol neb PRN 12: Renal osteodystrophy: Renvela and Sensipar as per nephrology 13: Esophagus: Continue PPI 14: Anemia, iron deficiency/chronic disease:  Aransep and Venofer per nephrology. 15: Peripheral arterial disease s/p right BKA on 12/30.  --Discussed with Dr. Delana Meyer (on call vascular surgeon for Dr. Lucky Cowboy) regarding antiplatelet therapy>>recommends aspirin 81 mg daily --Follow-up with Dr. Lucky Cowboy as outpatient   ***  Barbie Banner, PA-C 08/12/2021

## 2021-08-12 NOTE — Progress Notes (Signed)
PMR Admission Coordinator Pre-Admission Assessment   Patient: Scott Castro is an 66 y.o., male MRN: [id number] DOB: 1955/11/15 Height: 5\' 9"  (175.3 cm) Weight: 74.2 kg   Insurance Information HMO:     PPO:      PCP:      IPA:      80/20: yes     OTHER:  PRIMARY: Medicare A & B      Policy#: [id number]      Subscriber: patient CM Name:       Phone#:      Fax#:  Pre-Cert#:       Employer:  Benefits:  Phone #: verified eligibility via [employer] on 08/10/21     Name:  Eff. Date: Part A & B effective 06/09/02     Deduct: $1,600      Out of Pocket Max: NA      Life Max: NA CIR: 100% coverage      SNF: 100% days 1-20, 80% days 21-100 Outpatient: 80% coverage     Co-Pay: 20%  Home Health: 100% coverage      Co-Pay:  DME: 80% coverage     Co-Pay:  Providers: pt's choice SECONDARY: BCBS COMM PPO      Policy#: [id number]     Phone#: [phone]   Financial Counselor:       Phone#:    The Data Collection Information Summary for patients in Inpatient Rehabilitation Facilities with attached Privacy Act Register Records was provided and verbally reviewed with: Patient   Emergency Contact Information Contact Information       Name Relation Home Work Mobile    cox,kathy Relative     [phone]           Current Medical History  Patient Admitting Diagnosis: ischemic foot ulcer s/p R BKA History of Present Illness: Pt is a 67 year old male with medical hx significant for: PAD, ESRD on HD T,T,S, COPD on 2-3L [place], Barrett's esophagus, h/o multiple lung nodules, paroxysmal A-fib, ABI and angioplasty to right anterior tibial artery, tibial peroneal trunk and popliteal artery (07/20/21).   On 08/05/21, pt presented to [facility] d/t right foot pain and leg swelling. Pt  had worsening redness, blisters on right foot and toes. Venus ultrasound of RLE negative for DVT and x-ray of foot shows severe soft tissue swelling. Vascular surgery performed angioplasty and PTCA  right tibial peroneal trunk and right external iliac stenting on 08/06/21. Pt had right BKA on 08/07/21. Therapy evaluations completed and CIR recommended d/t pt's deficits in funtional mobility and ability to complete ADLs independently.    Patient's medical record from [facility] has been reviewed by the rehabilitation admission coordinator and physician.   Past Medical History      Past Medical History:  Diagnosis Date   Barrett's esophagus     Chronic kidney disease      CHRONIC\   Glomerulosclerosis, focal     Hyperparathyroidism due to renal insufficiency (HCC)        Has the patient had major surgery during 100 days prior to admission? Yes   Family History   family history includes Heart disease in his father and mother.   Current Medications   Current Facility-Administered Medications:    albuterol (PROVENTIL) (2.5 MG/3ML) 0.083% nebulizer solution 3 mL, 3 mL, Inhalation, Q6H PRN, Dew, Erskine Squibb, MD   apixaban (ELIQUIS) tablet 5 mg, 5 mg, Oral, BID, Dew, Erskine Squibb, MD, 5 mg at 08/10/21 1026  atorvastatin (LIPITOR) tablet 10 mg, 10 mg, Oral, QHS, Dew, Erskine Squibb, MD, 10 mg at 08/09/21 2102   cinacalcet (SENSIPAR) tablet 60 mg, 60 mg, Oral, Q supper, Dew, Erskine Squibb, MD, 60 mg at 08/09/21 1725   COVID-19 mRNA bivalent vaccine (Moderna) injection 0.5 mL, 0.5 mL, Intramuscular, ONCE-1600, Karleen Hampshire, Vijaya, MD   diltiazem (CARDIZEM CD) 24 hr capsule 180 mg, 180 mg, Oral, Daily, Dew, Erskine Squibb, MD, 180 mg at 08/10/21 1028   epoetin alfa (EPOGEN) injection 8,000 Units, 8,000 Units, Intravenous, Q T,Th,Sa-HD, Bhutani, Manpreet S, MD, 8,000 Units at 08/08/21 1227   feeding supplement (ENSURE ENLIVE / ENSURE PLUS) liquid 237 mL, 237 mL, Oral, BID BM, Algernon Huxley, MD, 237 mL at 08/10/21 1029   gabapentin (NEURONTIN) capsule 100 mg, 100 mg, Oral, BID, Samtani, Jai-Gurmukh, MD, 100 mg at 08/10/21 1026   HYDROcodone-acetaminophen (NORCO) 7.5-325 MG per tablet 1 tablet, 1 tablet,  Oral, TID PRN, Algernon Huxley, MD, 1 tablet at 08/09/21 1725   HYDROmorphone (DILAUDID) injection 1 mg, 1 mg, Intravenous, Q4H PRN, Nita Sells, MD, 1 mg at 08/10/21 0214   metaxalone (SKELAXIN) tablet 400 mg, 400 mg, Oral, TID, Verlon Au, Jai-Gurmukh, MD, 400 mg at 08/10/21 1027   methocarbamol (ROBAXIN) tablet 500 mg, 500 mg, Oral, Q8H PRN, Hosie Poisson, MD   metoprolol tartrate (LOPRESSOR) tablet 12.5 mg, 12.5 mg, Oral, BID, Samtani, Jai-Gurmukh, MD, 12.5 mg at 08/10/21 1026   mupirocin ointment (BACTROBAN) 2 % 1 application, 1 application, Nasal, BID, Dew, Erskine Squibb, MD, 1 application at 56/31/49 1030   pantoprazole (PROTONIX) EC tablet 40 mg, 40 mg, Oral, Daily, Dew, Erskine Squibb, MD, 40 mg at 08/10/21 1026   sevelamer carbonate (RENVELA) tablet 2,400 mg, 2,400 mg, Oral, TID WC, Algernon Huxley, MD, 2,400 mg at 08/10/21 7026   Patients Current Diet:  Diet Order                  Diet renal with fluid restriction Fluid restriction: 1200 mL Fluid; Room service appropriate? Yes; Fluid consistency: Thin  Diet effective now                         Precautions / Restrictions Precautions Precautions: Fall Restrictions Weight Bearing Restrictions: Yes RLE Weight Bearing: Non weight bearing Other Position/Activity Restrictions: s/p R BKA    Has the patient had 2 or more falls or a fall with injury in the past year? Yes   Prior Activity Level Community (5-7x/wk): works full-time, drives, gets out of house daily   Prior Functional Level Self Care: Did the patient need help bathing, dressing, using the toilet or eating? Independent   Indoor Mobility: Did the patient need assistance with walking from room to room (with or without device)? Independent   Stairs: Did the patient need assistance with internal or external stairs (with or without device)? Independent   Functional Cognition: Did the patient need help planning regular tasks such as shopping or remembering to take medications?  Independent   Patient Information Are you of Hispanic, Latino/a,or Spanish origin?: A. No, not of Hispanic, Latino/a, or Spanish origin What is your race?: A. White Do you need or want an interpreter to communicate with a doctor or health care staff?: 0. No   Patient's Response To:  Health Literacy and Transportation Is the patient able to respond to health literacy and transportation needs?: Yes Health Literacy - How often do you need to have someone help you when  you read instructions, pamphlets, or other written material from your doctor or pharmacy?: Never In the past 12 months, has lack of transportation kept you from medical appointments or from getting medications?: No In the past 12 months, has lack of transportation kept you from meetings, work, or from getting things needed for daily living?: No   Home Assistive Devices / [place] Devices/Equipment: Eyeglasses, Environmental consultant (specify type) [employer] wheel walker) Home Equipment: None   Prior Device Use: Indicate devices/aids used by the patient prior to current illness, exacerbation or injury? None of the above   Current Functional Level Cognition   Overall Cognitive Status: Within Functional Limits for tasks assessed Orientation Level: Oriented X4 General Comments: Pt emotional regarding BKA and wondering if it could have been prevented.    Extremity Assessment (includes Sensation/Coordination)   Upper Extremity Assessment: Generalized weakness (BUE grossly weak, unable to push up from EOB for standing. No focal weakness appreciated. Dialysis port LUE.)  Lower Extremity Assessment: Defer to PT evaluation, RLE deficits/detail RLE Deficits / Details: s/p RBKA - Endorses phantom limb sensation/pain. Automatcially attempts to move "R foot" out of the way "so it won't get stepped on" aware R foot is no longer present.     ADLs   Overall ADL's : Needs assistance/impaired General ADL Comments: Pt is functionally limited by  increased pain in his RLE, impaired balance, generalized weakness  (BUE & LLE), and decreased activity tolerance. He requires MIN A for bed mobility. He attempts STS x2 during session with MAX assist is unable to clear hips from EOB. Anticipate would benefit from +2 assist for STS attempts. MOD A for LB dressing/bathing, MOD-MAX A for bed-level peri-care, SET UP for UB ADL management.     Mobility   Overal bed mobility: Needs Assistance Bed Mobility: Supine to Sit Supine to sit: Min assist, HOB elevated Sit to supine: Min assist, HOB elevated General bed mobility comments: assist for R residual limb; increased effort/time to perform on own     Transfers   Overall transfer level: Needs assistance Equipment used: Rolling walker (2 wheels) Transfers: Sit to/from Stand, Bed to chair/wheelchair/BSC Sit to Stand: Total assist Bed to/from chair/wheelchair/BSC transfer type:: Lateral/scoot transfer  Lateral/Scoot Transfers: Min assist General transfer comment: unable to clear pt's bottom from bed attempting to stand up to RW x1 trial; laterally scooting to L (x3 trials) then to R (x3 trials) and then again to L (x3 trials); vc's required for positioning and overall technique with transfers     Ambulation / Gait / Stairs / Wheelchair Mobility   Ambulation/Gait General Gait Details: not appropriate at this time     Posture / Balance Dynamic Sitting Balance Sitting balance - Comments: steady static sitting Balance Overall balance assessment: Needs assistance Sitting-balance support: Single extremity supported (L LE supported) Sitting balance-Leahy Scale: Fair Sitting balance - Comments: steady static sitting Standing balance-Leahy Scale: Zero Standing balance comment: unable to assess at this time     Special needs/care consideration Dialysis: Hemodialysis Tuesday, Thursday, and Saturday, Oxygen 4L nasal cannula, and Skin Amputation: leg/right; Ecchymosis: arm/bilateral; Surgical incision:  leg/right    Previous Home Environment (from acute therapy documentation) Living Arrangements: Alone Available Help at Discharge: Friend(s), Neighbor, Available PRN/intermittently Type of Home: House Home Layout: One level Home Access: Stairs to enter Entrance Stairs-Rails: None Entrance Stairs-Number of Steps: 1 Bathroom Shower/Tub: Chiropodist: Standard Bathroom Accessibility: Yes How Accessible: Accessible via walker [place]: Yes Type of Home Care Services: Home  RN, [place] (if known): [facility]   Discharge Living Setting Plans for Discharge Living Setting: Patient's home Type of Home at Discharge: House Discharge Home Layout: One level Discharge Home Access: Stairs to enter Entrance Stairs-Rails: None Entrance Stairs-Number of Steps: 1 Discharge Bathroom Shower/Tub: Tub/shower unit Discharge Bathroom Toilet: Standard Discharge Bathroom Accessibility: Yes How Accessible: Accessible via walker Does the patient have any problems obtaining your medications?: No   Social/Family/Support Systems Anticipated Caregiver: [employer], friend; neighbors and other friends Anticipated Caregiver's Contact Information: Thayer Jew: [phone] Caregiver Availability: Intermittent Discharge Plan Discussed with Primary Caregiver: Yes Is Caregiver In Agreement with Plan?: Yes Does Caregiver/Family have Issues with Lodging/Transportation while Pt is in Rehab?: No   Goals Patient/Family Goal for Rehab: PT/OT Mod I  Expected length of stay: 12-14 days Pt/Family Agrees to Admission and willing to participate: Yes Program Orientation Provided & Reviewed with Pt/Caregiver Including Roles  & Responsibilities: Yes   Decrease burden of Care through IP rehab admission: NA   Possible need for SNF placement upon discharge: Not anticipated   Patient Condition: I have reviewed medical records from [facility], spoken with  CM, and patient. I discussed via phone for inpatient rehabilitation assessment.  Patient will benefit from ongoing PT and OT, can actively participate in 3 hours of therapy a day 5 days of the week, and can make measurable gains during the admission.  Patient will also benefit from the coordinated team approach during an Inpatient Acute Rehabilitation admission.  The patient will receive intensive therapy as well as Rehabilitation physician, nursing, social worker, and care management interventions.  Due to safety, skin/wound care, disease management, medication administration, pain management, and patient education the patient requires 24 hour a day rehabilitation nursing.  The patient is currently min-mod A  with mobility and basic ADLs.  Discharge setting and therapy post discharge at home with home health is anticipated.  Patient has agreed to participate in the Acute Inpatient Rehabilitation Program and will admit today.   Preadmission Screen Completed By:  Bethel Born, 08/10/2021 10:55 AM ______________________________________________________________________   Discussed status with Dr. Ranell Patrick  on 08/12/20 at 23 and received approval for admission today.   Admission Coordinator:  Bethel Born, CCC-SLP, time 1030/Date 08/12/20    Assessment/Plan: Diagnosis: RLE BKA Does the need for close, 24 hr/day Medical supervision in concert with the patient's rehab needs make it unreasonable for this patient to be served in a less intensive setting? Yes Co-Morbidities requiring supervision/potential complications: HLD, hyponatremia, HTN, multiple lung nodules, PAF Due to bladder management, bowel management, safety, skin/wound care, disease management, medication administration, pain management, and patient education, does the patient require 24 hr/day rehab nursing? Yes Does the patient require coordinated care of a physician, rehab nurse, PT, OT to address physical and functional deficits in  the context of the above medical diagnosis(es)? Yes Addressing deficits in the following areas: balance, endurance, locomotion, strength, transferring, bowel/bladder control, bathing, dressing, feeding, grooming, toileting, and psychosocial support Can the patient actively participate in an intensive therapy program of at least 3 hrs of therapy 5 days a week? Yes The potential for patient to make measurable gains while on inpatient rehab is excellent Anticipated functional outcomes upon discharge from inpatient rehab: supervision PT, supervision OT, independent SLP Estimated rehab length of stay to reach the above functional goals is: 10-14 days Anticipated discharge destination: Home 10. Overall Rehab/Functional Prognosis: excellent     MD Signature: Leeroy Cha,  MD

## 2021-08-12 NOTE — Progress Notes (Signed)
Orthopedic Tech Progress Note Patient Details:  ANDERS HOHMANN 25-Oct-1955 [id number]  Called in order to [facility] for an AMPUSHIELD    Patient ID: [name], male   DOB: 02-08-1956, 66 y.o.   MRN: [id number]  [name] 08/12/2021, 2:10 PM

## 2021-08-12 NOTE — Progress Notes (Signed)
Patient ID: Scott Castro, male   DOB: [birth year], 66 y.o.   MRN: [id number]   Pt arrived to 5C06 per PTAR. Pt oriented to rehab and policies reviewed. Pt in agreement. Pt a+ox4, vitals obtained, assessment completed. See flowsheets. Blanchable redness noted to pt bottom, prophylactic foam applied to sacrum. Pt repositioned to side. Pt call light in reach, bed in lowest position. Sheela Stack, LPN

## 2021-08-12 NOTE — Discharge Summary (Signed)
Physician Discharge Summary  Scott Castro IRS:[id number] DOB: 26-Jun-1956 DOA: 08/05/2021  PCP: Pcp, No  Admit date: 08/05/2021 Discharge date: 08/12/2021  Admitted From: home Disposition:  CIR  Recommendations for Outpatient Follow-up:  Follow up with PCP in 1-2 weeks Please obtain BMP/CBC in one week   Home Health:NO  Equipment/Devices: NONE  Discharge Condition: Stable Code Status:   Code Status: Full Code Diet recommendation:  Diet Order             Diet renal with fluid restriction Fluid restriction: 1200 mL Fluid; Room service appropriate? Yes; Fluid consistency: Thin  Diet effective now                    Brief/Interim Summary:  ESRD on TTS , s/p RENAL transplant, sec hyperparathyroidism, Barretts esophagus, PAF on eliquis, admitted for right foot pain, had critical limb ischemia, was seen by vascular surgeon , underwent right Right BKA, . Therapy eval recommending inpatient rehab. Hospital course is summarized as below in problem list  Discharge Diagnoses:   RLE critical limb ischemia status post BKA by vascular on 12/30 incisions are looking good continue dressing continue pain control with oral and IV opiates.  Continue gabapentin, Robaxin PT OT.  PT OT has recommended SAR and was waiting for a bed, available today and is being discharged   ESRD on TTS/history of renal transplant: Continue dialysis per schedule per nephrology Metabolic bone disease monitor calcium and Phos level, continue Renvela per nephrology Anemia of renal disease/chronic disease: Monitor hemoglobin transfuse if less than 7 g continue Epogen GERD on PPI   PAF: Rate controlled on p.o. Cardizem and metoprolol given soft blood pressure decrease Cardizem to 12 mg-can stop st CIR if BP remains low. It appears patient was probably not taking at home continue Eliquis .    Moderate malnutrition: Augment nutrition Multiple lung Nodules:outpatient follow up with PCP and annual CT Hyperlipidemia:  Continue Lipitor Hyponatremia: In setting of ESRD.  Monitor Hypertension blood pressure soft: Continue Cardizem metoprolol for A. fib reports he was on multiple antihypertensive which has been discontinued.  Consults: Nephrology, vascular continue to rehab  Subjective: Alert awake oriented x3 blood pressure soft this morning but asymptomatic.  No complaints.  Waiting for CIR Discharge Exam: Vitals:   08/12/21 0411 08/12/21 0734  BP: 98/60 97/72  Pulse: 91 93  Resp:  17  Temp:  99.1 F (37.3 C)  SpO2:  100%   General: Pt is alert, awake, not in acute distress Cardiovascular: RRR, S1/S2 +, no rubs, no gallops Respiratory: CTA bilaterally, no wheezing, no rhonchi Abdominal: Soft, NT, ND, bowel sounds + Extremities: no edema, no cyanosis  Discharge Instructions  Discharge Instructions     Discharge instructions   Complete by: As directed    Monitor blood pressure if soft less than 110 can hold Cardizem Please call call MD or return to ER for similar or worsening recurring problem that brought you to hospital or if any fever,nausea/vomiting,abdominal pain, uncontrolled pain, chest pain,  shortness of breath or any other alarming symptoms.  Please follow-up your doctor as instructed in a week time and call the office for appointment.  Please avoid alcohol, smoking, or any other illicit substance and maintain healthy habits including taking your regular medications as prescribed.  You were cared for by a hospitalist during your hospital stay. If you have any questions about your discharge medications or the care you received while you were in the hospital after you are discharged,  you can call the unit and ask to speak with the hospitalist on call if the hospitalist that took care of you is not available.  Once you are discharged, your primary care physician will handle any further medical issues. Please note that NO REFILLS for any discharge medications will be authorized once you  are discharged, as it is imperative that you return to your primary care physician (or establish a relationship with a primary care physician if you do not have one) for your aftercare needs so that they can reassess your need for medications and monitor your lab values   You will need annual CT scan for lung nodules to make sure it does not become cancerous   Discharge wound care:   Complete by: As directed    Continue dressing change as per vascular   Increase activity slowly   Complete by: As directed       Allergies as of 08/12/2021   No Known Allergies      Medication List     STOP taking these medications    clopidogrel 75 MG tablet Commonly known as: PLAVIX       TAKE these medications    albuterol 108 (90 Base) MCG/ACT inhaler Commonly known as: VENTOLIN HFA Inhale 2 puffs into the lungs every 6 (six) hours as needed for wheezing or shortness of breath.   apixaban 5 MG Tabs tablet Commonly known as: ELIQUIS Take 1 tablet (5 mg total) by mouth 2 (two) times daily.   atorvastatin 10 MG tablet Commonly known as: LIPITOR Take 1 tablet (10 mg total) by mouth at bedtime.   cinacalcet 60 MG tablet Commonly known as: SENSIPAR Take 60 mg by mouth daily.   diclofenac Sodium 1 % Gel Commonly known as: VOLTAREN Apply 2 g topically 4 (four) times daily. Use on feet   diltiazem 120 MG 24 hr capsule Commonly known as: CARDIZEM CD Take 1 capsule (120 mg total) by mouth daily. Hold for sbp < 100 What changed:  medication strength how much to take additional instructions   feeding supplement Liqd Take 237 mLs by mouth 2 (two) times daily between meals.   gabapentin 100 MG capsule Commonly known as: NEURONTIN Take 1 capsule (100 mg total) by mouth 2 (two) times daily.   HYDROcodone-acetaminophen 7.5-325 MG tablet Commonly known as: NORCO Take 1 tablet by mouth 3 (three) times daily as needed.   methocarbamol 500 MG tablet Commonly known as: ROBAXIN Take 1  tablet (500 mg total) by mouth every 8 (eight) hours as needed for muscle spasms.   metoprolol tartrate 25 MG tablet Commonly known as: LOPRESSOR Take 0.5 tablets (12.5 mg total) by mouth 2 (two) times daily. What changed:  medication strength how much to take   pantoprazole 40 MG tablet Commonly known as: PROTONIX Take 40 mg by mouth daily.   polyethylene glycol 17 g packet Commonly known as: MIRALAX / GLYCOLAX Take 17 g by mouth daily as needed.   senna-docusate 8.6-50 MG tablet Commonly known as: Senokot-S Take 2 tablets by mouth 2 (two) times daily.   sevelamer carbonate 800 MG tablet Commonly known as: RENVELA Take by mouth See admin instructions. Take 2400 mg by mouth three times daily, 1600 mg with snacks               Discharge Care Instructions  (From admission, onward)           Start     Ordered   08/12/21 0000  Discharge  wound care:       Comments: Continue dressing change as per vascular   08/12/21 1030            Follow-up Information     Kris Hartmann, NP Follow up in 2 week(s).   Specialty: Vascular Surgery Why: For wound re-check Contact information: [place] [address] [phone]                No Known Allergies  The results of significant diagnostics from this hospitalization (including imaging, microbiology, ancillary and laboratory) are listed below for reference.    Microbiology: Recent Results (from the past 240 hour(s))  Culture, blood (routine x 2)     Status: None   Collection Time: 08/05/21  2:40 PM   Specimen: BLOOD  Result Value Ref Range Status   Specimen Description BLOOD RIGHT ANTECUBITAL  Final   Special Requests   Final    BOTTLES DRAWN AEROBIC AND ANAEROBIC Blood Culture results may not be optimal due to an inadequate volume of blood received in culture bottles   Culture   Final    NO GROWTH 6 DAYS Performed at [facility], [address] [place], [place] [address]     Report Status 08/11/2021 FINAL  Final  Culture, blood (routine x 2)     Status: None   Collection Time: 08/05/21  3:30 PM   Specimen: BLOOD  Result Value Ref Range Status   Specimen Description BLOOD BLOOD RIGHT HAND  Final   Special Requests   Final    BOTTLES DRAWN AEROBIC AND ANAEROBIC Blood Culture adequate volume   Culture   Final    NO GROWTH 6 DAYS Performed at [facility], [address] [place], [place] [address]    Report Status 08/11/2021 FINAL  Final  Resp Panel by RT-PCR (Flu A&B, Covid) Nasopharyngeal Swab     Status: None   Collection Time: 08/05/21  6:36 PM   Specimen: Nasopharyngeal Swab; Nasopharyngeal(NP) swabs in vial transport medium  Result Value Ref Range Status   SARS Coronavirus 2 by RT PCR NEGATIVE NEGATIVE Final    Comment: (NOTE) SARS-CoV-2 target nucleic acids are NOT DETECTED.  The SARS-CoV-2 RNA is generally detectable in upper respiratory specimens during the acute phase of infection. The lowest concentration of SARS-CoV-2 viral copies this assay can detect is 138 copies/mL. A negative result does not preclude SARS-Cov-2 infection and should not be used as the sole basis for treatment or other patient management decisions. A negative result may occur with  improper specimen collection/handling, submission of specimen other than nasopharyngeal swab, presence of viral mutation(s) within the areas targeted by this assay, and inadequate number of viral copies(<138 copies/mL). A negative result must be combined with clinical observations, patient history, and epidemiological information. The expected result is Negative.  Fact Sheet for Patients:  EntrepreneurPulse.com.au  Fact Sheet for Healthcare Providers:  IncredibleEmployment.be  This test is no t yet approved or cleared by the [place] FDA and  has been authorized for detection and/or diagnosis of SARS-CoV-2 by FDA under an Emergency Use  Authorization (EUA). This EUA will remain  in effect (meaning this test can be used) for the duration of the COVID-19 declaration under Section 564(b)(1) of the Act, 21 U.S.C.section 360bbb-3(b)(1), unless the authorization is terminated  or revoked sooner.       Influenza A by PCR NEGATIVE NEGATIVE Final   Influenza B by PCR NEGATIVE NEGATIVE Final    Comment: (NOTE)  The Xpert Xpress SARS-CoV-2/FLU/RSV plus assay is intended as an aid in the diagnosis of influenza from Nasopharyngeal swab specimens and should not be used as a sole basis for treatment. Nasal washings and aspirates are unacceptable for Xpert Xpress SARS-CoV-2/FLU/RSV testing.  Fact Sheet for Patients: EntrepreneurPulse.com.au  Fact Sheet for Healthcare Providers: IncredibleEmployment.be  This test is not yet approved or cleared by the [place] FDA and has been authorized for detection and/or diagnosis of SARS-CoV-2 by FDA under an Emergency Use Authorization (EUA). This EUA will remain in effect (meaning this test can be used) for the duration of the COVID-19 declaration under Section 564(b)(1) of the Act, 21 U.S.C. section 360bbb-3(b)(1), unless the authorization is terminated or revoked.  Performed at [facility], [address] [place], [place] [address]   Surgical PCR screen     Status: None   Collection Time: 08/06/21 11:23 PM   Specimen: Nasal Mucosa; Nasal Swab  Result Value Ref Range Status   MRSA, PCR NEGATIVE NEGATIVE Final   Staphylococcus aureus NEGATIVE NEGATIVE Final    Comment: (NOTE) The Xpert SA Assay (FDA approved for NASAL specimens in patients 71 years of age and older), is one component of a comprehensive surveillance program. It is not intended to diagnose infection nor to guide or monitor treatment. Performed at [facility], [address] [place], [place] [address]     Procedures/Studies: CT CHEST WO  CONTRAST  Result Date: 07/29/2021 CLINICAL DATA:  History of cough in a 66 year old male. EXAM: CT CHEST WITHOUT CONTRAST TECHNIQUE: Multidetector CT imaging of the chest was performed following the standard protocol without IV contrast. COMPARISON:  November 26, 2020 and chest x-ray from November twenty-fifth 2022. FINDINGS: Cardiovascular: Calcified atheromatous plaque of the thoracic aorta. No signs of aneurysmal dilation. Marked cardiac enlargement is increased from previous imaging. Small to moderate pericardial effusion. Three-vessel coronary artery calcification. Mitral annular calcification. Central pulmonary vasculature is engorged to 3.2 cm. Mediastinum/Nodes: Mild to moderately enlarged lymph nodes throughout the chest largest is a RIGHT paratracheal node measuring 16 mm (image 58/2) scattered lymph nodes elsewhere in the chest without signs of pathologic enlargement. Top-normal subcarinal lymph node at 14 mm. No hilar adenopathy. No axillary adenopathy. Small lymph nodes track into the neck. Lungs/Pleura: Signs of rounded atelectasis at the RIGHT lung base associated with chronic small RIGHT-sided pleural effusion also with mild chronic non nodular appearing pleural thickening. Pleural fluid is mildly loculated and mildly increased from previous imaging in the RIGHT chest. Loculated fluid in the major fissure in the LEFT chest measuring 10.8 x 5.2 cm. This is contiguous with small chronic appearing LEFT-sided pleural effusion with small to moderate sub pulmonic component as well. Pleural fluid is also loculated along the medial LEFT chest. Developing rounded atelectasis is present at the LEFT lung base. Micro nodular pattern with calcification in the anterior LEFT chest is compatible with sequela of prior infection and is unchanged. Bandlike subpleural scarring along the peripheral RIGHT chest has developed since more remote imaging from 2020. No signs of focal ground-glass or lobar level consolidation.  Upper Abdomen: Extensive calcification of visceral branches in the upper abdomen. No acute upper abdominal process. Small volume ascites. Musculoskeletal: Renal osteodystrophy and spinal degenerative changes. No destructive or acute bone process. IMPRESSION: Increase in loculated pleural fluid particularly in the LEFT chest since more remote imaging. This is associated with small to moderate pericardial effusion and may be related to underlying volume overload or heart failure and is associated with  rounded atelectasis at the lung bases. Loculated appearance more likely related to chronicity but can be seen in the setting of chronic effusion or empyema, no definitive signs infection currently such as associated gas locules. Bandlike subpleural scarring along the peripheral RIGHT chest has developed since more remote imaging from 2020 perhaps related to recent infection. Increase in size and lymph nodes in the chest largest at 16 mm. These may be reactive or related to underlying cardiac dysfunction. Suggest 3 month follow-up to assess for any changes. Marked cardiac enlargement and coronary artery disease. Cardiac enlargement is increased compared to previous imaging. Small volume ascites further supporting the possibility of volume overload or heart failure. Renal osteodystrophy and spinal degenerative changes. Aortic atherosclerosis. Aortic Atherosclerosis (ICD10-I70.0). Electronically Signed   By: Zetta Bills M.D.   On: 07/29/2021 17:36   MR FOOT RIGHT WO CONTRAST  Result Date: 08/07/2021 CLINICAL DATA:  Right foot and leg pain. History of dialysis. Foot swelling. EXAM: MRI OF THE RIGHT FOREFOOT WITHOUT CONTRAST TECHNIQUE: Multiplanar, multisequence MR imaging of the right foot was performed. No intravenous contrast was administered. COMPARISON:  None. FINDINGS: Bones/Joint/Cartilage Soft tissue wound at the tip of the great toe. Bone marrow edema in the first proximal and distal phalanx, second middle  and distal phalanx, third distal phalanx, fourth middle and distal phalanx and fifth distal phalanx concerning for osteomyelitis. Mild marrow edema in the shaft of the fifth metatarsal and fifth proximal phalanx concerning for stress reaction versus osteomyelitis. Normal alignment. No joint effusion. Ligaments Collateral ligaments are intact.  Lisfranc ligament is intact. Muscles and Tendons Flexor, peroneal and extensor compartment tendons are intact. Muscles are normal. Soft tissue No fluid collection or hematoma. No soft tissue mass. Severe soft tissue edema involving the foot and ankle which may be reactive versus secondary to osteomyelitis. IMPRESSION: 1. Soft tissue wound at the tip of the great toe. Bone marrow edema in the first proximal and distal phalanx, second middle and distal phalanx, third distal phalanx, fourth middle and distal phalanx and fifth distal phalanx concerning for osteomyelitis. 2. Mild marrow edema in the shaft of the fifth metatarsal and fifth proximal phalanx concerning for stress reaction versus osteomyelitis. 3. Severe soft tissue edema involving the foot and ankle which may be reactive versus secondary to osteomyelitis. Electronically Signed   By: Kathreen Devoid M.D.   On: 08/07/2021 08:01   PERIPHERAL VASCULAR CATHETERIZATION  Result Date: 08/06/2021 See surgical note for result.  PERIPHERAL VASCULAR CATHETERIZATION  Result Date: 07/20/2021 See surgical note for result.  US Venous Img Lower Right (DVT Study)  Result Date: 08/05/2021 CLINICAL DATA:  Calf swelling. EXAM: RIGHT LOWER EXTREMITY VENOUS DOPPLER ULTRASOUND TECHNIQUE: Gray-scale sonography with compression, as well as color and duplex ultrasound, were performed to evaluate the deep venous system(s) from the level of the common femoral vein through the popliteal and proximal calf veins. COMPARISON:  RIGHT lower extremity venous duplex, 07/04/2021. RIGHT foot XRs, 08/05/2021. FINDINGS: VENOUS Normal  compressibility of the RIGHT common femoral, superficial femoral, and popliteal veins, as well as the visualized calf veins. Visualized portions of profunda femoral vein and great saphenous vein unremarkable. No filling defects to suggest DVT on grayscale or color Doppler imaging. Doppler waveforms show normal direction of venous flow, normal respiratory plasticity and response to augmentation. Limited views of the contralateral common femoral vein are unremarkable. OTHER No evidence of superficial thrombophlebitis or abnormal fluid collection. Subcutaneous edema within the distal RIGHT lower extremity. Limitations: none IMPRESSION: No evidence of femoropopliteal  DVT within the RIGHT lower extremity. Michaelle Birks, MD [facility] Electronically Signed   By: Michaelle Birks M.D.   On: 08/05/2021 16:16   DG Foot Complete Right  Result Date: 08/05/2021 CLINICAL DATA:  Infection and pain in a 66 year old male. Discoloration to the tips of the first, second and third toe. EXAM: RIGHT FOOT COMPLETE - 3+ VIEW COMPARISON:  Jan 02, 2021. FINDINGS: Vascular calcifications in the soft tissues. No discrete soft tissue ulceration visible on radiograph No bony changes to suggest osteomyelitis or acute bone finding. Osteopenia. Diffuse soft tissue swelling is however noted over the dorsum of the foot and plantar aspect of the foot. This is worse in the forefoot. Diffuse soft tissue swelling also in the visualized portions of the LEFT lower extremity. IMPRESSION: 1. No acute bone finding. No discrete soft tissue ulceration visible on radiograph. 2. Diffuse severe soft tissue swelling over the dorsum of the foot and plantar aspect of the foot and extending into the LEFT lower extremity. Findings may represent severe edema and or infection. Electronically Signed   By: Zetta Bills M.D.   On: 08/05/2021 15:13   VAS [name] ABI WITH/WO TBI  Result Date: 07/17/2021  LOWER  EXTREMITY DOPPLER STUDY Patient Name:  NOVAH GOZA  Date of Exam:   07/14/2021 Medical Rec #: [id number]     Accession #:    [id number] Date of Birth: 11-18-1955     Patient Gender: M Patient Age:   66 years Exam Location:  [facility] Procedure:      VAS [name] ABI WITH/WO TBI Referring Phys: Leotis Pain --------------------------------------------------------------------------------  Indications: Rest pain, and Rt Great Toe Pain.  Performing Technologist: Almira Coaster RVS  Examination Guidelines: A complete evaluation includes at minimum, Doppler waveform signals and systolic blood pressure reading at the level of bilateral brachial, anterior tibial, and posterior tibial arteries, when vessel segments are accessible. Bilateral testing is considered an integral part of a complete examination. Photoelectric Plethysmograph (PPG) waveforms and toe systolic pressure readings are included as required and additional duplex testing as needed. Limited examinations for reoccurring indications may be performed as noted.  ABI Findings: +---------+------------------+-----+----------+--------+  Right     Rt Pressure (mmHg) Index Waveform   Comment   +---------+------------------+-----+----------+--------+  Brachial  153                                           +---------+------------------+-----+----------+--------+  ATA       61                       monophasic .40       +---------+------------------+-----+----------+--------+  PTA       83                 0.54  monophasic           +---------+------------------+-----+----------+--------+  Great Toe 0                  0.00  Absent               +---------+------------------+-----+----------+--------+ +---------+------------------+-----+--------+-------+  Left      Lt Pressure (mmHg) Index Waveform Comment  +---------+------------------+-----+--------+-------+  ATA       250  biphasic [place]       +---------+------------------+-----+--------+-------+   PTA       120                0.78  biphasic          +---------+------------------+-----+--------+-------+  Great Toe 65                 0.42  Abnormal          +---------+------------------+-----+--------+-------+ +-------+-----------+-----------+------------+------------+  ABI/TBI Today's ABI Today's TBI Previous ABI Previous TBI  +-------+-----------+-----------+------------+------------+  Right   .54         0                                      +-------+-----------+-----------+------------+------------+  Left    >1.0 [place]     .42                                    +-------+-----------+-----------+------------+------------+  Summary: Right: Resting right ankle-brachial index indicates moderate right lower extremity arterial disease. The right toe-brachial index is abnormal. Left: Resting left ankle-brachial index indicates noncompressible left lower extremity arteries. The left toe-brachial index is abnormal.  *See table(s) above for measurements and observations.  Electronically signed by Leotis Pain MD on 07/17/2021 at 9:03:08 AM.    Final     Labs: BNP (last 3 results) Recent Labs    07/18/21 1622  BNP 9,163.8*   Basic Metabolic Panel: Recent Labs  Lab 08/05/21 1440 08/05/21 2258 08/06/21 1725 08/08/21 0516 08/09/21 0538  NA 138  --  139 136 132*  K 5.1  --  4.3 5.1 4.8  CL 98  --  98 97* 94*  CO2 28  --  27 27 27   GLUCOSE 98  --  69* 91 86  BUN 40*  --  22 44* 32*  CREATININE 7.58* 7.69* 5.19* 7.64* 6.25*  CALCIUM 8.4*  --  8.4* 7.5* 7.3*  MG  --   --  1.8  --   --   PHOS  --   --  4.7*  --  5.0*   Liver Function Tests: Recent Labs  Lab 08/05/21 1440 08/06/21 1725 08/09/21 0538  AST 14*  --   --   ALT 9  --   --   ALKPHOS 92  --   --   BILITOT 0.7  --   --   PROT 7.6  --   --   ALBUMIN 2.8* 2.7* 2.3*   No results for input(s): LIPASE, AMYLASE in the last 168 hours. No results for input(s): AMMONIA in the last 168 hours. CBC: Recent Labs  Lab 08/05/21 1440  08/05/21 2258 08/06/21 1225 08/08/21 0516 08/09/21 0538  WBC 7.8 6.8 5.5 7.7 8.3  NEUTROABS 5.9  --  3.3  --  5.9  HGB 10.5* 9.1* 9.1* 8.2* 8.5*  HCT 33.9* 29.4* 29.1* 26.0* 27.7*  MCV 103.0* 102.1* 100.3* 102.4* 101.8*  PLT 283 248 255 199 176   Cardiac Enzymes: No results for input(s): CKTOTAL, CKMB, CKMBINDEX, TROPONINI in the last 168 hours. BNP: Invalid input(s): POCBNP CBG: Recent Labs  Lab 08/07/21 2155  GLUCAP 117*   D-Dimer No results for input(s): DDIMER in the last 72 hours. Hgb A1c No results for input(s): HGBA1C in the last 72 hours. Lipid Profile No results  for input(s): CHOL, HDL, LDLCALC, TRIG, CHOLHDL, LDLDIRECT in the last 72 hours. Thyroid function studies No results for input(s): TSH, T4TOTAL, T3FREE, THYROIDAB in the last 72 hours.  Invalid input(s): FREET3 Anemia work up No results for input(s): VITAMINB12, FOLATE, FERRITIN, TIBC, IRON, RETICCTPCT in the last 72 hours. Urinalysis No results found for: COLORURINE, APPEARANCEUR, [place], [place], [place], [place], [place], [place], Brenda, UROBILINOGEN, NITRITE, LEUKOCYTESUR Sepsis [facility] Invalid input(s): PROCALCITONIN,  WBC,  LACTICIDVEN Microbiology Recent Results (from the past 240 hour(s))  Culture, blood (routine x 2)     Status: None   Collection Time: 08/05/21  2:40 PM   Specimen: BLOOD  Result Value Ref Range Status   Specimen Description BLOOD RIGHT ANTECUBITAL  Final   Special Requests   Final    BOTTLES DRAWN AEROBIC AND ANAEROBIC Blood Culture results may not be optimal due to an inadequate volume of blood received in culture bottles   Culture   Final    NO GROWTH 6 DAYS Performed at [facility], [address] [place], [place] [address]    Report Status 08/11/2021 FINAL  Final  Culture, blood (routine x 2)     Status: None   Collection Time: 08/05/21  3:30 PM   Specimen: BLOOD  Result Value Ref Range Status   Specimen Description BLOOD BLOOD RIGHT HAND  Final    Special Requests   Final    BOTTLES DRAWN AEROBIC AND ANAEROBIC Blood Culture adequate volume   Culture   Final    NO GROWTH 6 DAYS Performed at [facility], [address] [place], [place] [address]    Report Status 08/11/2021 FINAL  Final  Resp Panel by RT-PCR (Flu A&B, Covid) Nasopharyngeal Swab     Status: None   Collection Time: 08/05/21  6:36 PM   Specimen: Nasopharyngeal Swab; Nasopharyngeal(NP) swabs in vial transport medium  Result Value Ref Range Status   SARS Coronavirus 2 by RT PCR NEGATIVE NEGATIVE Final    Comment: (NOTE) SARS-CoV-2 target nucleic acids are NOT DETECTED.  The SARS-CoV-2 RNA is generally detectable in upper respiratory specimens during the acute phase of infection. The lowest concentration of SARS-CoV-2 viral copies this assay can detect is 138 copies/mL. A negative result does not preclude SARS-Cov-2 infection and should not be used as the sole basis for treatment or other patient management decisions. A negative result may occur with  improper specimen collection/handling, submission of specimen other than nasopharyngeal swab, presence of viral mutation(s) within the areas targeted by this assay, and inadequate number of viral copies(<138 copies/mL). A negative result must be combined with clinical observations, patient history, and epidemiological information. The expected result is Negative.  Fact Sheet for Patients:  EntrepreneurPulse.com.au  Fact Sheet for Healthcare Providers:  IncredibleEmployment.be  This test is no t yet approved or cleared by the [place] FDA and  has been authorized for detection and/or diagnosis of SARS-CoV-2 by FDA under an Emergency Use Authorization (EUA). This EUA will remain  in effect (meaning this test can be used) for the duration of the COVID-19 declaration under Section 564(b)(1) of the Act, 21 U.S.C.section 360bbb-3(b)(1), unless the authorization is  terminated  or revoked sooner.       Influenza A by PCR NEGATIVE NEGATIVE Final   Influenza B by PCR NEGATIVE NEGATIVE Final    Comment: (NOTE) The Xpert Xpress SARS-CoV-2/FLU/RSV plus assay is intended as an aid in the diagnosis of influenza from Nasopharyngeal swab specimens and should not be used as a  sole basis for treatment. Nasal washings and aspirates are unacceptable for Xpert Xpress SARS-CoV-2/FLU/RSV testing.  Fact Sheet for Patients: EntrepreneurPulse.com.au  Fact Sheet for Healthcare Providers: IncredibleEmployment.be  This test is not yet approved or cleared by the [place] FDA and has been authorized for detection and/or diagnosis of SARS-CoV-2 by FDA under an Emergency Use Authorization (EUA). This EUA will remain in effect (meaning this test can be used) for the duration of the COVID-19 declaration under Section 564(b)(1) of the Act, 21 U.S.C. section 360bbb-3(b)(1), unless the authorization is terminated or revoked.  Performed at [facility], [address], [place], [place] [address]   Surgical PCR screen     Status: None   Collection Time: 08/06/21 11:23 PM   Specimen: Nasal Mucosa; Nasal Swab  Result Value Ref Range Status   MRSA, PCR NEGATIVE NEGATIVE Final   Staphylococcus aureus NEGATIVE NEGATIVE Final    Comment: (NOTE) The Xpert SA Assay (FDA approved for NASAL specimens in patients 37 years of age and older), is one component of a comprehensive surveillance program. It is not intended to diagnose infection nor to guide or monitor treatment. Performed at [facility], [address] [place], [place] [address]      Time coordinating discharge: 25 minutes  SIGNED: Antonieta Pert, MD  [facility] 08/12/2021, 10:30 AM  If 7PM-7AM, please contact night-coverage [email or web address]

## 2021-08-12 NOTE — Progress Notes (Signed)
Inpatient Rehabilitation Discharge Medication Review by a Pharmacist  A complete drug regimen review was completed for this patient to identify any potential clinically significant medication issues.  High Risk Drug Classes Is patient taking? Indication by Medication  Antipsychotic No   Anticoagulant Yes Eliquis for Afib  Antibiotic No   Opioid Yes Vicodin prn pain  Antiplatelet No   Hypoglycemics/insulin No   Vasoactive Medication Yes Metoprolol, diltiazem for BP, Afib  Chemotherapy No   Other Yes Gabapentin for pain Protonix for GERD     Type of Medication Issue Identified Description of Issue Recommendation(s)  Drug Interaction(s) (clinically significant)     Duplicate Therapy     Allergy     No Medication Administration End Date     Incorrect Dose     Additional Drug Therapy Needed     Significant med changes from prior encounter (inform family/care partners about these prior to discharge).    Other       Clinically significant medication issues were identified that warrant physician communication and completion of prescribed/recommended actions by midnight of the next day:  No    Pharmacist comments: ESRD - medications resumed  Time spent performing this drug regimen review (minutes):  20 minutes   Tad Moore 08/12/2021 1:32 PM

## 2021-08-12 NOTE — H&P (Signed)
Physical Medicine and Rehabilitation Admission H&P    CC: s/p R BKA  HPI: Scott Castro is a 66 year old male with a history of peripheral vascular disease followed by Dr. Daylene Katayama for ongoing right foot pain. He initially presented to [facility] emergency department on 07/18/2021 with intractable right foot pain. ABIs performed on 07/14/2021 showed 0 right great toe pressure and an ABI of 0.5. He was admitted and started on heparin infusion for criticial limb ischemia. A vascular surgery consultation was obtained by Dr. Trula Slade and he underwent arteriogram with intervention on 07/20/2021 by Dr. Lucky Cowboy. He underwent right TP trunk, proximal peroneal and right popliteal artery angioplasty. A cardiology consultation was obtained on 07/19/2021 for history of atrial fibrillation and elevated heart rate to 144 bpm. He was started on Cardizem infusion. He had no chest pain and blood pressure was soft so metoprolol and enalapril were held.  History of ESRD on chronic iHD on Tuesdays, Thursdays and Saturdays followed by [facility] nephrology, Dr. Radene Knee.  He has a left upper arm arteriovenous fistula.  Nephrology was consulted for routine dialysis orders.  The heparin infusion was discontinued and he was started on Plavix and statin.  Aspirin therapy was deferred due to initiation of apixaban for his atrial fibrillation.  This was initiated on 12/13.  His metoprolol was continued that day as well.  [name] infusion was discontinued and he was ready for discharge home on 12/15 with these medications as well as diltiazem.  He represented to [facility] emergency department on 07/2821 with persistent right foot pain and right calf swelling.  On physical examination he had acute ischemic changes to his right great toe and second toes.  There was swelling and erythema of the dorsum of the right foot.  Heparin infusion was started. He was admitted and vascular surgery re-consulted.  His foot was ruborous and somewhat swollen on exam  nation.  Cool to touch and no palpable pulses.  He underwent arteriogram on 12/29 by Dr. Lucky Cowboy with right TP trunk and posterior tibial artery angioplasty as well as stent placement to the right external iliac artery.  Right lower extremity venous Doppler ultrasound negative for DVT.  On 12/30, he was taken to the operating room and underwent right below the knee amputation by Dr. Lucky Cowboy.  His heparin infusion continued postoperatively.  This was discontinued on 1231 and his Eliquis was resumed.  Significant past medical history: Admitted on 07/03/2021 with SOB and treated for Covid-19 related pneumonia with history of history of chronic obstructive pulmonary disease/asthma.  Currently on 3 L oxygen supplement mentation via nasal cannula.  He does not use home oxygen.  He underwent CT scan of the chest on 12/21 with findings of loculated left pleural effusion, subpleural scarring noted along the peripheral right chest, increase in size of chest lymph nodes as compared to CT on 11/26/2020.  18-month follow-up suggested.  SARS corona virus PCR -12/10 and 12/28. History of Barrett's esophagus, renal osteodystrophy, cardiomegaly, secondary hyperparathyroidism. Outpatient dialysis treatments at [facility], supervised by [facility] physicians.  Past surgical history: Prior kidney transplant approximately 1985.  Kidney failed in the early 2000's.  He underwent peritoneal dialysis catheter placement and peritoneal dialysis.  Social history: Patient denies tobacco use present or prior.  He lives alone in single story home.  He currently complains of residual limb and phantom pain.   Review of Systems  Constitutional:  Negative for chills and fever.  Cardiovascular:  Positive for leg swelling. Negative for  chest pain.  Gastrointestinal:  Negative for nausea and vomiting.  Genitourinary:        Anuric  Musculoskeletal:        Mild to moderate right residual limb postoperative pain.  He states his right knee has  had a mild chronic flexion contracture.  Past Medical History:  Diagnosis Date   Barrett's esophagus    Chronic kidney disease    CHRONIC\   Glomerulosclerosis, focal    Hyperparathyroidism due to renal insufficiency [facility])    Past Surgical History:  Procedure Laterality Date   AMPUTATION Right 08/07/2021   Procedure: AMPUTATION BELOW KNEE;  Surgeon: Algernon Huxley, MD;  Location: [facility] ORS;  Service: Vascular;  Laterality: Right;   AV FISTULA PLACEMENT     COLONOSCOPY N/A 03/22/2016   Procedure: COLONOSCOPY;  Surgeon: Manya Silvas, MD;  Location: [facility] ENDOSCOPY;  Service: Endoscopy;  Laterality: N/A;   DG AV DIALYSIS GRAFT DECLOT OR     ESOPHAGOGASTRODUODENOSCOPY (EGD) WITH PROPOFOL  03/22/2016   Procedure: ESOPHAGOGASTRODUODENOSCOPY (EGD) WITH PROPOFOL;  Surgeon: Manya Silvas, MD;  Location: [facility] ENDOSCOPY;  Service: Endoscopy;;   FLEXIBLE BRONCHOSCOPY     KIDNEY TRANSPLANT Right 1985   LOWER EXTREMITY ANGIOGRAPHY Right 07/20/2021   Procedure: Lower Extremity Angiography;  Surgeon: Algernon Huxley, MD;  Location: [place] CV LAB;  Service: Cardiovascular;  Laterality: Right;   LOWER EXTREMITY ANGIOGRAPHY Right 08/06/2021   Procedure: Lower Extremity Angiography;  Surgeon: Algernon Huxley, MD;  Location: [place] CV LAB;  Service: Cardiovascular;  Laterality: Right;   REMOVAL TENCKHOFF CATH     Family History  Problem Relation Age of Onset   Heart disease Mother    Heart disease Father    Allergies: No Known Allergies Medications Prior to Admission  Medication Sig Dispense Refill   albuterol (VENTOLIN HFA) 108 (90 Base) MCG/ACT inhaler Inhale 2 puffs into the lungs every 6 (six) hours as needed for wheezing or shortness of breath.     apixaban (ELIQUIS) 5 MG TABS tablet Take 1 tablet (5 mg total) by mouth 2 (two) times daily. 60 tablet 0   atorvastatin (LIPITOR) 10 MG tablet Take 1 tablet (10 mg total) by mouth at bedtime. 30 tablet 0   cinacalcet (SENSIPAR) 60 MG tablet  Take 60 mg by mouth daily.     diclofenac Sodium (VOLTAREN) 1 % GEL Apply 2 g topically 4 (four) times daily. Use on feet     diltiazem (CARDIZEM CD) 120 MG 24 hr capsule Take 1 capsule (120 mg total) by mouth daily. Hold for sbp < 100     feeding supplement (ENSURE ENLIVE / ENSURE PLUS) LIQD Take 237 mLs by mouth 2 (two) times daily between meals. 237 mL 12   gabapentin (NEURONTIN) 100 MG capsule Take 1 capsule (100 mg total) by mouth 2 (two) times daily.     HYDROcodone-acetaminophen (NORCO) 7.5-325 MG tablet Take 1 tablet by mouth 3 (three) times daily as needed.     methocarbamol (ROBAXIN) 500 MG tablet Take 1 tablet (500 mg total) by mouth every 8 (eight) hours as needed for muscle spasms.     metoprolol tartrate (LOPRESSOR) 25 MG tablet Take 0.5 tablets (12.5 mg total) by mouth 2 (two) times daily.     pantoprazole (PROTONIX) 40 MG tablet Take 40 mg by mouth daily.     polyethylene glycol (MIRALAX / GLYCOLAX) 17 g packet Take 17 g by mouth daily as needed. 14 each 0   senna-docusate (SENOKOT-S) 8.6-50  MG tablet Take 2 tablets by mouth 2 (two) times daily.     sevelamer carbonate (RENVELA) 800 MG tablet Take by mouth See admin instructions. Take 2400 mg by mouth three times daily, 1600 mg with snacks      Drug Regimen Review  Drug regimen was reviewed and remains appropriate with no significant issues identified  Home: Home Living Family/patient expects to be discharged to:: Private residence Living Arrangements: Alone Available Help at Discharge: Friend(s), Neighbor, Available PRN/intermittently Type of Home: House Home Access: Stairs to enter Technical brewer of Steps: 1 Entrance Stairs-Rails: None Home Layout: One level Bathroom Shower/Tub: Chiropodist: Standard Bathroom Accessibility: Yes Home Equipment: None   Functional History: Prior Function Prior Level of Function : Independent/Modified Independent Mobility Comments: Independent, working 3rd  shift at [employer], driving   Functional Status:  Mobility: Bed Mobility Overal bed mobility: Needs Assistance Bed Mobility: Supine to Sit Supine to sit: Min assist Sit to supine: Min assist, HOB elevated General bed mobility comments: assist for R residual limb; increased effort/time to perform on own Transfers Overall transfer level: Needs assistance Equipment used: Rolling walker (2 wheels) Transfers: Sit to/from Stand Sit to Stand: Mod assist Bed to/from chair/wheelchair/BSC transfer type:: Stand pivot Stand pivot transfers: Mod assist  Lateral/Scoot Transfers: Min assist General transfer comment: Pt able to attain full upright standing posture at RW. Ambulation/Gait General Gait Details: not appropriate at this time   ADL: ADL Overall ADL's : Needs assistance/impaired General ADL Comments: MOD A + RW for bed>chair t/f, unable to tolerate prolonged standing to trial standing grooming tasks. SETUP + SUPERVISION seated grooming.   Cognition: Cognition Overall Cognitive Status: Within Functional Limits for tasks assessed Orientation Level: Oriented X4 Cognition Arousal/Alertness: Awake/alert Behavior During Therapy: WFL for tasks assessed/performed Overall Cognitive Status: Within Functional Limits for tasks assessed General Comments: Pt feeling a little more optomistic today  Physical Exam: Blood pressure 93/68, pulse 95, temperature 99.7 F (37.6 C), temperature source Oral, resp. rate 18, height 5\' 9"  (1.753 m), weight 74.6 kg, SpO2 93 %. Physical Exam Constitutional:      Comments: Chronically ill-appearing male, thin with muscle wasting.  HENT:     Mouth/Throat:     Mouth: Mucous membranes are dry.  Eyes:     Pupils: Pupils are equal, round, and reactive to light.  Cardiovascular:     Rate and Rhythm: Normal rate and regular rhythm.  Pulmonary:     Effort: Pulmonary effort is normal.     Breath sounds: Normal breath sounds.     Comments: Increased AP chest  diameter Abdominal:     General: Abdomen is flat. Bowel sounds are normal.     Palpations: Abdomen is soft.  Musculoskeletal:     Left lower leg: Edema present.     Comments: Right BKA incision is well approximated.  Flaps are warm and well perfused.  Left foot sensorimotor intact.  No ischemic skin changes.  Skin:    No results found for this or any previous visit (from the past 48 hour(s)). No results found.     Medical Problem List and Plan: 1. Functional deficits secondary to recent right below the knee amputation secondary to critical limb ischemia, peripheral arterial disease, severe right lower extremity tibial artery disease.  -patient may shower but incision must be covered.   -ELOS/Goals: 10-14 days S  -ordered shrinker and limb guard.  2.  Antithrombotics: -DVT/anticoagulation:  Mechanical: Sequential compression devices, below knee Left lower extremity Pharmaceutical: Other (  comment) apixaban  -antiplatelet therapy: aspirin  3. Pain Management: Hydrocodone. Discussed trying mirror therapy.  4. Mood: LCSW to evaluate and provide emotional support  -antipsychotic agents: Not applicable 5. Neuropsych: This patient is capable of making decisions on his own behalf. 6. Skin/Wound Care: Routine skin checks.  Routine incisional care.  Dry gauze and Ace wrap to right residual limb. 7. Fluids/Electrolytes/Nutrition: Fluid restriction, monitor intake and daily weight 8.  End-stage renal disease on chronic intermittent hemodialysis.  T/T/S --Consult nephrology 9: Atrial fibrillation: Continue Lopressor, Eliquis 10: Hyperlipidemia: Continue Lipitor 11: COPD/asthma: albuterol neb PRN 12: Renal osteodystrophy: Renvela and Sensipar as per nephrology 13: Esophagus: Continue PPI 14: Anemia, iron deficiency/chronic disease: Aransep and Venofer per nephrology. 15: Peripheral arterial disease s/p right BKA on 12/30.  --Discussed with Dr. Delana Meyer (on call vascular surgeon for Dr. Lucky Cowboy)  regarding antiplatelet therapy>>recommends aspirin 81 mg daily --Follow-up with Dr. Lucky Cowboy as outpatient   I have personally performed a face to face diagnostic evaluation, including, but not limited to relevant history and physical exam findings, of this patient and developed relevant assessment and plan.  Additionally, I have reviewed and concur with the physician assistant's documentation above.  Risa Grill, PA  Izora Ribas, MD 08/12/2021

## 2021-08-13 DIAGNOSIS — Z89511 Acquired absence of right leg below knee: Secondary | ICD-10-CM | POA: Diagnosis not present

## 2021-08-13 LAB — CBC
HCT: 28.1 % — ABNORMAL LOW (ref 39.0–52.0)
Hemoglobin: 8.7 g/dL — ABNORMAL LOW (ref 13.0–17.0)
MCH: 30.9 pg (ref 26.0–34.0)
MCHC: 31 g/dL (ref 30.0–36.0)
MCV: 99.6 fL (ref 80.0–100.0)
Platelets: 251 10*3/uL (ref 150–400)
RBC: 2.82 MIL/uL — ABNORMAL LOW (ref 4.22–5.81)
RDW: 15.9 % — ABNORMAL HIGH (ref 11.5–15.5)
WBC: 10 10*3/uL (ref 4.0–10.5)
nRBC: 0 % (ref 0.0–0.2)

## 2021-08-13 LAB — RENAL FUNCTION PANEL
Albumin: 2 g/dL — ABNORMAL LOW (ref 3.5–5.0)
Anion gap: 13 (ref 5–15)
BUN: 59 mg/dL — ABNORMAL HIGH (ref 8–23)
CO2: 25 mmol/L (ref 22–32)
Calcium: 7 mg/dL — ABNORMAL LOW (ref 8.9–10.3)
Chloride: 91 mmol/L — ABNORMAL LOW (ref 98–111)
Creatinine, Ser: 8.35 mg/dL — ABNORMAL HIGH (ref 0.61–1.24)
GFR, Estimated: 7 mL/min — ABNORMAL LOW (ref >60)
Glucose, Bld: 86 mg/dL (ref 70–99)
Phosphorus: 5.2 mg/dL — ABNORMAL HIGH (ref 2.5–4.6)
Potassium: 5.2 mmol/L — ABNORMAL HIGH (ref 3.5–5.1)
Sodium: 129 mmol/L — ABNORMAL LOW (ref 135–145)

## 2021-08-13 MED ORDER — OXYCODONE HCL 5 MG PO TABS
5.0000 mg | ORAL_TABLET | ORAL | Status: DC | PRN
  Administered 2021-08-13 – 2021-08-15 (×6): 10 mg via ORAL
  Administered 2021-08-15: 5 mg via ORAL
  Administered 2021-08-17 – 2021-08-18 (×2): 10 mg via ORAL
  Administered 2021-08-19 – 2021-08-25 (×8): 5 mg via ORAL
  Administered 2021-08-25 – 2021-08-26 (×3): 10 mg via ORAL
  Administered 2021-08-27: 5 mg via ORAL
  Administered 2021-08-27: 10 mg via ORAL
  Administered 2021-08-27 (×2): 5 mg via ORAL
  Administered 2021-08-28 – 2021-08-29 (×3): 10 mg via ORAL
  Filled 2021-08-13: qty 1
  Filled 2021-08-13: qty 2
  Filled 2021-08-13: qty 1
  Filled 2021-08-13 (×6): qty 2
  Filled 2021-08-13 (×5): qty 1
  Filled 2021-08-13 (×2): qty 2
  Filled 2021-08-13: qty 1
  Filled 2021-08-13 (×5): qty 2
  Filled 2021-08-13: qty 1
  Filled 2021-08-13 (×3): qty 2
  Filled 2021-08-13: qty 1
  Filled 2021-08-13: qty 2

## 2021-08-13 MED ORDER — SERTRALINE HCL 50 MG PO TABS
50.0000 mg | ORAL_TABLET | Freq: Every day | ORAL | Status: DC
  Administered 2021-08-13 – 2021-08-25 (×13): 50 mg via ORAL
  Filled 2021-08-13 (×13): qty 1

## 2021-08-13 NOTE — Progress Notes (Signed)
Informed consent obtained for dialysis. Consent placed in pt chart. Sheela Stack, LPN

## 2021-08-13 NOTE — Progress Notes (Signed)
Occupational Therapy Session Note  Patient Details  Name: Scott Castro MRN: [id number] Date of Birth: 10-Nov-1955  Today's Date: 08/14/2021 OT Individual Time: 1435-1540 OT Individual Time Calculation (min): 65 min   Short Term Goals: Week 1:  OT Short Term Goal 1 (Week 1): Pt will complete stand pivot to toilet using RW with min A. OT Short Term Goal 2 (Week 1): Pt will  sit to stand to RW with S to prep for LB self care., OT Short Term Goal 3 (Week 1): Pt will stand with CGA as he manages clothing over hips during self care tasks. OT Short Term Goal 4 (Week 1): Pt will complete simulated tub bench transfer with CGA.  Skilled Therapeutic Interventions/Progress Updates:    Pt greeted in bed, premedicated for pain. He was able to doff his gripper sock and apply lotion to foot, note that pt required a rest break after he donned his gripper sock again due to [place]. Pt on 1L 02 throughout tx. Supine<sit from flat bed without bedrail completed with setup assistance. Min A for lateral scoot<w/c and Mod A for squat pivot<BSC over toilet. Pt able to utilize leans to lower clothing. He then had multiple BM voids. Able to complete hygiene himself using leans with setup and significantly increased time. Pt had some bleeding on wash cloths during hygiene due to hemorrhoid flareup. Notified RN. Pt doffed soiled LB garments and then threaded LEs into underwear and pants with supervision. Pt able to perform small push ups using the toilet bars in order for OT to assist elevate clothing. He needed multiple rest breaks and increased time to complete stated toileting tasks at max level of independence. Mod A to return to w/c where pt opted to remain sitting up. Left him with all needs within reach. Tx focus placed on adaptive self care skills, activity tolerance, and functional transfers.   Therapy Documentation Precautions:  Precautions Precautions: Fall Restrictions Weight Bearing Restrictions: Yes RLE Weight  Bearing: Non weight bearing Other Position/Activity Restrictions: s/p R BKA:   Vital Signs:   Pain: Pain Assessment Pain Score: 5  ADL: ADL Eating: Independent Grooming: Setup Upper Body Bathing: Setup Where Assessed-Upper Body Bathing: Sitting at sink Lower Body Bathing: Minimal assistance Where Assessed-Lower Body Bathing: Sitting at sink Upper Body Dressing: Setup Where Assessed-Upper Body Dressing: Wheelchair Lower Body Dressing: Moderate assistance Where Assessed-Lower Body Dressing: Wheelchair Toileting: Moderate assistance Where Assessed-Toileting: Glass blower/designer: Moderate assistance Toilet Transfer Method: Squat pivot Toilet Transfer Equipment: Raised toilet seat, Grab bars           :  :     Therapy/Group: Individual Therapy  Lucretia Pendley A Tymber Stallings 08/14/2021, 4:16 PM

## 2021-08-13 NOTE — Progress Notes (Signed)
PROGRESS NOTE   Subjective/Complaints:  Doesn't form urine anymore- maybe a few drops every few months.   Feels like pain is awful- wants stornger pain meds- perseverated on this topic- since pain so bad, was "Crying" last night.   LBM last night.  HD scheduled for today per nephrology.   Also having phantom pain in addition to post op pain.  ROS:  Pt denies SOB, abd pain, CP, N/V/C/D, and vision changes   Objective:   No results found. No results for input(s): WBC, HGB, HCT, PLT in the last 72 hours. No results for input(s): NA, K, CL, CO2, GLUCOSE, BUN, CREATININE, CALCIUM in the last 72 hours.  Intake/Output Summary (Last 24 hours) at 08/13/2021 1007 Last data filed at 08/13/2021 0731 Gross per 24 hour  Intake 480 ml  Output --  Net 480 ml        Physical Exam: Vital Signs Blood pressure 113/77, pulse 92, temperature 98.2 F (36.8 C), temperature source Oral, resp. rate 18, height 5\' 9"  (1.753 m), weight 74.6 kg, SpO2 91 %.   General: awake, alert, appropriate, cachetic appearing/frail but BMI 24; sitting up in bed; wearing 1L O2 by [place];  NAD HENT: conjugate gaze; oropharynx moist CV: regular rate; no JVD Pulmonary: coarse, but not specific W/R/R- decreased at bases B?L  GI: soft, NT, ND, (+)BS Psychiatric: very irritable and perseverating on pain Neurological: Ox3 Musculoskeletal:     Left lower leg: Edema present.     Comments: Right BKA incision is well approximated.  Flaps are warm and well perfused.  Left foot sensorimotor intact.  No ischemic skin changes.  Skin:     Assessment/Plan: 1. Functional deficits which require 3+ hours per day of interdisciplinary therapy in a comprehensive inpatient rehab setting. Physiatrist is providing close team supervision and 24 hour management of active medical problems listed below. Physiatrist and rehab team continue to assess barriers to discharge/monitor patient  progress toward functional and medical goals  Care Tool:  Bathing              Bathing assist       Upper Body Dressing/Undressing Upper body dressing        Upper body assist      Lower Body Dressing/Undressing Lower body dressing      What is the patient wearing?: Ace wrap/stump shrinker     Lower body assist Assist for lower body dressing: Maximal Assistance - Patient 25 - 49%     Toileting Toileting    Toileting assist Assist for toileting: Minimal Assistance - Patient > 75%     Transfers Chair/bed transfer  Transfers assist           Locomotion Ambulation   Ambulation assist              Walk 10 feet activity   Assist           Walk 50 feet activity   Assist           Walk 150 feet activity   Assist           Walk 10 feet on uneven surface  activity   Assist  Wheelchair     Assist               Wheelchair 50 feet with 2 turns activity    Assist            Wheelchair 150 feet activity     Assist          Blood pressure 113/77, pulse 92, temperature 98.2 F (36.8 C), temperature source Oral, resp. rate 18, height 5\' 9"  (1.753 m), weight 74.6 kg, SpO2 91 %.  Medical Problem List and Plan: 1. Functional deficits secondary to recent right below the knee amputation secondary to critical limb ischemia, peripheral arterial disease, severe right lower extremity tibial artery disease.             -patient may shower but incision must be covered.              -ELOS/Goals: 10-14 days S             -ordered shrinker and limb guard.   First day of evaluations- Con't CIR- PT and OT 2.  Antithrombotics: -DVT/anticoagulation:  Mechanical: Sequential compression devices, below knee Left lower extremity Pharmaceutical: Other (comment) apixaban             -antiplatelet therapy: aspirin  3. Pain Management: Hydrocodone. Discussed trying mirror therapy.  1/5- will switch Norco to  Oxycodone 5-10 mg q4 hours prn- since pt is out of control. Hopefully, will be able to get better control with increase in meds, but will eval daily.  4. Mood: LCSW to evaluate and provide emotional support             -antipsychotic agents: Not applicable 5. Neuropsych: This patient is capable of making decisions on his own behalf. 6. Skin/Wound Care: Routine skin checks.  Routine incisional care.  Dry gauze and Ace wrap to right residual limb.  1/5- will order shrinker to help with shaping/edema control.  7. Fluids/Electrolytes/Nutrition: Fluid restriction, monitor intake and daily weight 8.  End-stage renal disease on chronic intermittent hemodialysis.  T/T/S --Consult nephrology 9: Atrial fibrillation: Continue Lopressor, Eliquis  1/5- rate controlled- con't regimen 10: Hyperlipidemia: Continue Lipitor 11: COPD/asthma: albuterol neb PRN 12: Renal osteodystrophy: Renvela and Sensipar as per nephrology 13: Barrett's Esophagus: Continue PPI 14: Anemia, iron deficiency/chronic disease: Aransep and Venofer per nephrology. 15: Peripheral arterial disease s/p right BKA on 12/30.  --Discussed with Dr. Delana Meyer (on call vascular surgeon for Dr. Lucky Cowboy) regarding antiplatelet therapy>>recommends aspirin 81 mg daily --Follow-up with Dr. Lucky Cowboy as outpatient  25. Depression 1/5- will start Zoloft 50 mg daily since pt admits his mood is "very poor".      LOS: 1 days A FACE TO FACE EVALUATION WAS PERFORMED  Tyriek Hofman 08/13/2021, 10:07 AM

## 2021-08-13 NOTE — Evaluation (Signed)
Occupational Therapy Assessment and Plan  Patient Details  Name: Scott Castro MRN: [id number] Date of Birth: [birth year]  OT Diagnosis: acute pain and muscle weakness (generalized) Rehab Potential: Rehab Potential (ACUTE ONLY): Excellent ELOS: 10-14 days   Today's Date: 08/13/2021 OT Individual Time: 4765-4650 OT Individual Time Calculation (min): 65 min     Hospital Problem: Principal Problem:   S/P BKA (below knee amputation), right ([place]) Active Problems:   Ischemic foot ulcer due to atherosclerosis of native artery of limb ([place])   Past Medical History:  Past Medical History:  Diagnosis Date   Barrett's esophagus    Chronic kidney disease    CHRONIC\   Glomerulosclerosis, focal    Hyperparathyroidism due to renal insufficiency [facility])    Past Surgical History:  Past Surgical History:  Procedure Laterality Date   AMPUTATION Right 08/07/2021   Procedure: AMPUTATION BELOW KNEE;  Surgeon: Algernon Huxley, MD;  Location: [facility] ORS;  Service: Vascular;  Laterality: Right;   AV FISTULA PLACEMENT     COLONOSCOPY N/A 03/22/2016   Procedure: COLONOSCOPY;  Surgeon: Manya Silvas, MD;  Location: [facility];  Service: Endoscopy;  Laterality: N/A;   DG AV DIALYSIS GRAFT DECLOT OR     ESOPHAGOGASTRODUODENOSCOPY (EGD) WITH PROPOFOL  03/22/2016   Procedure: ESOPHAGOGASTRODUODENOSCOPY (EGD) WITH PROPOFOL;  Surgeon: Manya Silvas, MD;  Location: [facility] ENDOSCOPY;  Service: Endoscopy;;   FLEXIBLE BRONCHOSCOPY     KIDNEY TRANSPLANT Right 1985   LOWER EXTREMITY ANGIOGRAPHY Right 07/20/2021   Procedure: Lower Extremity Angiography;  Surgeon: Algernon Huxley, MD;  Location: [place] CV LAB;  Service: Cardiovascular;  Laterality: Right;   LOWER EXTREMITY ANGIOGRAPHY Right 08/06/2021   Procedure: Lower Extremity Angiography;  Surgeon: Algernon Huxley, MD;  Location: [place] CV LAB;  Service: Cardiovascular;  Laterality: Right;   REMOVAL TENCKHOFF CATH      Assessment & Plan Clinical  Impression: Nathen Castro is a 66 year old male with a history of peripheral vascular disease followed by Dr. Daylene Katayama for ongoing right foot pain. He initially presented to [facility] emergency department on 07/18/2021 with intractable right foot pain. ABIs performed on 07/14/2021 showed 0 right great toe pressure and an ABI of 0.5. He was admitted and started on heparin infusion for criticial limb ischemia. A vascular surgery consultation was obtained by Dr. Trula Slade and he underwent arteriogram with intervention on 07/20/2021 by Dr. Lucky Cowboy. He underwent right TP trunk, proximal peroneal and right popliteal artery angioplasty. A cardiology consultation was obtained on 07/19/2021 for history of atrial fibrillation and elevated heart rate to 144 bpm. He was started on Cardizem infusion. He had no chest pain and blood pressure was soft so metoprolol and enalapril were held.  History of ESRD on chronic iHD on Tuesdays, Thursdays and Saturdays followed by [facility] nephrology, Dr. Radene Knee.  He has a left upper arm arteriovenous fistula.  Nephrology was consulted for routine dialysis orders.  The heparin infusion was discontinued and he was started on Plavix and statin.  Aspirin therapy was deferred due to initiation of apixaban for his atrial fibrillation.  This was initiated on 12/13.  His metoprolol was continued that day as well.  [name] infusion was discontinued and he was ready for discharge home on 12/15 with these medications as well as diltiazem.   He represented to [facility] emergency department on 07/2821 with persistent right foot pain and right calf swelling.  On physical examination he had acute ischemic changes to his right great toe and  second toes.  There was swelling and erythema of the dorsum of the right foot.  Heparin infusion was started. He was admitted and vascular surgery re-consulted.  His foot was ruborous and somewhat swollen on exam nation.  Cool to touch and no palpable pulses.  He underwent arteriogram on  12/29 by Dr. Lucky Cowboy with right TP trunk and posterior tibial artery angioplasty as well as stent placement to the right external iliac artery.  Right lower extremity venous Doppler ultrasound negative for DVT.  On 12/30, he was taken to the operating room and underwent right below the knee amputation by Dr. Lucky Cowboy.  His heparin infusion continued postoperatively.  This was discontinued on 1231 and his Eliquis was resumed.   Significant past medical history: Admitted on 07/03/2021 with SOB and treated for Covid-19 related pneumonia with history of history of chronic obstructive pulmonary disease/asthma.  Currently on 3 L oxygen supplement mentation via nasal cannula.  He does not use home oxygen.  He underwent CT scan of the chest on 12/21 with findings of loculated left pleural effusion, subpleural scarring noted along the peripheral right chest, increase in size of chest lymph nodes as compared to CT on 11/26/2020.  78-monthfollow-up suggested.  SARS corona virus PCR -12/10 and 12/28. History of Barrett's esophagus, renal osteodystrophy, cardiomegaly, secondary hyperparathyroidism. [facility]   Past surgical history: Prior kidney transplant approximately 1985.  Kidney failed in the early 2000's.  He underwent peritoneal dialysis catheter placement and peritoneal dialysis.   Patient transferred to CIR on 08/12/2021 .    Patient currently requires mod with basic self-care skills secondary to muscle weakness, decreased cardiorespiratoy endurance and decreased oxygen support, and decreased standing balance and decreased balance strategies.  Prior to hospitalization, patient could complete ADLs with modified independent .  Patient will benefit from skilled intervention to increase independence with basic self-care skills prior to discharge home independently.  Anticipate patient will require intermittent supervision and follow up home  health.  OT - End of Session Activity Tolerance: Tolerates < 10 min activity with changes in vital signs Endurance Deficit: Yes Endurance Deficit Description: attempted seated bathing on room air but O2 decreased to 85%, [id number] to need supplemental oxygen OT Assessment Rehab Potential (ACUTE ONLY): Excellent OT Barriers to Discharge: Decreased caregiver support OT Barriers to Discharge Comments: pt lives alone, cousins live 1 hour away OT Patient demonstrates impairments in the following area(s): Balance;Endurance;Pain;Motor OT Basic ADL's Functional Problem(s): Bathing;Dressing;Toileting OT Advanced ADL's Functional Problem(s): Simple Meal Preparation;Light Housekeeping OT Transfers Functional Problem(s): Toilet;Tub/Shower OT Additional Impairment(s): None OT Plan OT Intensity: Minimum of 1-2 x/day, 45 to 90 minutes OT Frequency: 5 out of 7 days OT Duration/Estimated Length of Stay: 10-14 days OT Treatment/Interventions: Balance/vestibular training;Discharge planning;DME/adaptive equipment instruction;Functional mobility training;Pain management;Patient/family education;Psychosocial support;Self Care/advanced ADL retraining;UE/LE Strength taining/ROM;Therapeutic Exercise;Therapeutic Activities OT Self Feeding Anticipated Outcome(s): no goal, pt is independent OT Basic Self-Care Anticipated Outcome(s): Mod I OT Toileting Anticipated Outcome(s): Mod I OT Bathroom Transfers Anticipated Outcome(s): Mod I OT Recommendation Patient destination: Home Follow Up Recommendations: Home health OT Equipment Recommended: 3 in 1 bedside comode;Tub/shower bench   OT Evaluation Precautions/Restrictions  Precautions Precautions: Fall Restrictions Weight Bearing Restrictions: Yes RLE Weight Bearing: Non weight bearing Other Position/Activity Restrictions: s/p R BKA   Pain Pain Assessment Pain Scale: 0-10 Pain Score: 5  Pain Location: Leg Pain Orientation: Right Pain Intervention(s):  Medication (See eMAR) Home Living/Prior Functioning Home Living Family/patient expects to be discharged  to:: Private residence Living Arrangements: Alone Available Help at Discharge: Friend(s), Neighbor, Available PRN/intermittently Type of Home: House Home Access: Stairs to enter Technical brewer of Steps: 1 Entrance Stairs-Rails: None Home Layout: One level Bathroom Shower/Tub: Tub/shower unit  Lives With: Alone Prior Function Level of Independence: Independent with gait, Independent with homemaking with ambulation, Other (comment) (short distance gait only, limited by pain and endurance)  Able to Take Stairs?: Yes Driving: Yes Vocation: Full time employment (has not worked since October) Vocation Requirements: intermittently active, running samples Vision Baseline Vision/History: 1 Wears glasses Ability to See in Adequate Light: 0 Adequate Patient Visual Report: No change from baseline Vision Assessment?: No apparent visual deficits Perception  Perception: Within Functional Limits Praxis Praxis: Intact Cognition Overall Cognitive Status: Within Functional Limits for tasks assessed Arousal/Alertness: Awake/alert Orientation Level: Person;Place;Situation Person: Oriented Place: Oriented Situation: Oriented Year: 2023 Month: January Day of Week: Correct Memory: Appears intact Immediate Memory Recall: Sock;Blue;Bed Memory Recall Sock: Without Cue Memory Recall Blue: Without Cue Memory Recall Bed: Without Cue Awareness: Appears intact Problem Solving: Appears intact Safety/Judgment: Appears intact Sensation Sensation Light Touch: Appears Intact Hot/Cold: Appears Intact Proprioception: Appears Intact Stereognosis: Appears Intact Coordination Gross Motor Movements are Fluid and Coordinated: No Fine Motor Movements are Fluid and Coordinated: Yes Coordination and Movement Description: grossly uncoordinated d/t recent amputation Motor  Motor Motor: Within  Functional Limits Motor - Skilled Clinical Observations: altered d/t recent amputation  Trunk/Postural Assessment  Postural Control Postural Control: Within Functional Limits  Balance Dynamic Sitting Balance Dynamic Sitting - Level of Assistance: 5: Stand by assistance Static Standing Balance Static Standing - Level of Assistance: 4: Min assist Dynamic Standing Balance Dynamic Standing - Level of Assistance: 3: Mod assist Extremity/Trunk Assessment RUE Assessment General Strength Comments: 4/5 LUE Assessment General Strength Comments: 4/5  Care Tool Care Tool Self Care Eating   Eating Assist Level: Independent    Oral Care    Oral Care Assist Level: Set up assist    Bathing   Body parts bathed by patient: Right arm;Left arm;Abdomen;Chest;Front perineal area;Right upper leg;Left upper leg;Face Body parts bathed by helper: Buttocks;Left lower leg Body parts n/a: Right lower leg Assist Level: Minimal Assistance - Patient > 75%    Upper Body Dressing(including orthotics)   What is the patient wearing?: Pull over shirt   Assist Level: Set up assist    Lower Body Dressing (excluding footwear)   What is the patient wearing?: Pants Assist for lower body dressing: Moderate Assistance - Patient 50 - 74%    Putting on/Taking off footwear   What is the patient wearing?: Non-skid slipper socks Assist for footwear: Moderate Assistance - Patient 50 - 74%       Care Tool Toileting Toileting activity   Assist for toileting: Moderate Assistance - Patient 50 - 74%     Care Tool Bed Mobility Roll left and right activity   Roll left and right assist level: Supervision/Verbal cueing    Sit to lying activity        Lying to sitting on side of bed activity   Lying to sitting on side of bed assist level: the ability to move from lying on the back to sitting on the side of the bed with no back support.: Supervision/Verbal cueing     Care Tool Transfers Sit to stand transfer    Sit to stand assist level: Minimal Assistance - Patient > 75%    Chair/bed transfer   Chair/bed transfer assist level: Moderate Assistance -  Patient 50 - 74%     Toilet transfer   Assist Level: Moderate Assistance - Patient 50 - 74%     Care Tool Cognition  Expression of Ideas and Wants Expression of Ideas and Wants: 4. Without difficulty (complex and basic) - expresses complex messages without difficulty and with speech that is clear and easy to understand  Understanding Verbal and Non-Verbal Content Understanding Verbal and Non-Verbal Content: 4. Understands (complex and basic) - clear comprehension without cues or repetitions   Memory/Recall Ability Memory/Recall Ability : Current season;Location of own room;Staff names and faces;That he or she is in a hospital/hospital unit   Refer to Care Plan for [place] OT Short Term Goal 1 (Week 1): Pt will complete stand pivot to toilet using RW with min A. OT Short Term Goal 2 (Week 1): Pt will  sit to stand to RW with S to prep for LB self care., OT Short Term Goal 3 (Week 1): Pt will stand with CGA as he manages clothing over hips during self care tasks. OT Short Term Goal 4 (Week 1): Pt will complete simulated tub bench transfer with CGA.  Recommendations for other services: Therapeutic Recreation  Other      Skilled Therapeutic Intervention ADL ADL Eating: Independent Grooming: Setup Upper Body Bathing: Setup Where Assessed-Upper Body Bathing: Sitting at sink Lower Body Bathing: Minimal assistance Where Assessed-Lower Body Bathing: Sitting at sink Upper Body Dressing: Setup Where Assessed-Upper Body Dressing: Wheelchair Lower Body Dressing: Moderate assistance Where Assessed-Lower Body Dressing: Wheelchair Toileting: Moderate assistance Where Assessed-Toileting: Glass blower/designer: Moderate assistance Toilet Transfer Method: Squat pivot Toilet Transfer Equipment: Raised toilet seat;Grab  bars Mobility  Transfers Sit to Stand: Minimal Assistance - Patient > 75% Stand to Sit: Minimal Assistance - Patient > 75%  Pt seen for initial evaluation and ADL training with a focus on functional mobility. Pt received in bed and agreeable to a sponge bath.  Due to the need to retrieve equipment (w/c, portable O2, set up bathroom equipment) did not have enough time for a shower.  Pt did complete sit to stand from wc to RW with cues and min A.  Squat pivot transfers bed to wc and to toilet more of a mod A due to limited UE strength.  Reviewed role of OT, discussed pt's goals,  POC.  Pt resting in wc at end of session talking with SW.   Discharge Criteria: Patient will be discharged from OT if patient refuses treatment 3 consecutive times without medical reason, if treatment goals not met, if there is a change in medical status, if patient makes no progress towards goals or if patient is discharged from hospital.  The above assessment, treatment plan, treatment alternatives and goals were discussed and mutually agreed upon: by patient  [facility] 08/13/2021, 10:58 AM

## 2021-08-13 NOTE — Progress Notes (Signed)
Inpatient Rehabilitation Care Coordinator Assessment and Plan Patient Details  Name: Scott Castro MRN: [id number] Date of Birth: [birth year]  Today's Date: 08/13/2021  Hospital Problems: Principal Problem:   S/P BKA (below knee amputation), right ([place]) Active Problems:   Ischemic foot ulcer due to atherosclerosis of native artery of limb [facility])  Past Medical History:  Past Medical History:  Diagnosis Date   Barrett's esophagus    Chronic kidney disease    CHRONIC\   Glomerulosclerosis, focal    Hyperparathyroidism due to renal insufficiency [facility])    Past Surgical History:  Past Surgical History:  Procedure Laterality Date   AMPUTATION Right 08/07/2021   Procedure: AMPUTATION BELOW KNEE;  Surgeon: Algernon Huxley, MD;  Location: [facility] ORS;  Service: Vascular;  Laterality: Right;   AV FISTULA PLACEMENT     COLONOSCOPY N/A 03/22/2016   Procedure: COLONOSCOPY;  Surgeon: Manya Silvas, MD;  Location: [place];  Service: Endoscopy;  Laterality: N/A;   DG AV DIALYSIS GRAFT DECLOT OR     ESOPHAGOGASTRODUODENOSCOPY (EGD) WITH PROPOFOL  03/22/2016   Procedure: ESOPHAGOGASTRODUODENOSCOPY (EGD) WITH PROPOFOL;  Surgeon: Manya Silvas, MD;  Location: [facility] ENDOSCOPY;  Service: Endoscopy;;   FLEXIBLE BRONCHOSCOPY     KIDNEY TRANSPLANT Right 1985   LOWER EXTREMITY ANGIOGRAPHY Right 07/20/2021   Procedure: Lower Extremity Angiography;  Surgeon: Algernon Huxley, MD;  Location: [place] CV LAB;  Service: Cardiovascular;  Laterality: Right;   LOWER EXTREMITY ANGIOGRAPHY Right 08/06/2021   Procedure: Lower Extremity Angiography;  Surgeon: Algernon Huxley, MD;  Location: [place] CV LAB;  Service: Cardiovascular;  Laterality: Right;   REMOVAL TENCKHOFF CATH     Social History:  reports that he has never smoked. He has never used smokeless tobacco. He reports that he does not currently use alcohol. He reports that he does not use drugs.  Family / Support Systems Marital Status:  Single Patient Roles: Other (Comment) (cousin and friend) Other Supports: Juliann Pulse Cox-first cousin [phone]  Tana Conch [phone] Anticipated Caregiver: Self Ability/Limitations of Caregiver: Only has intermittent assist via cousin and neighbor-chekcing on him Caregiver Availability: Intermittent Family Dynamics: Close with first cousin but she and husband live in [place] they do work from home, but not sure if can come to pt. He has friends and co-worjers who are supportive, but will need to be mod/i to go home safely  Social History Preferred language: English Religion: None Cultural Background: No issues Education: Tiro - How often do you need to have someone help you when you read instructions, pamphlets, or other written material from your doctor or pharmacy?: Never Writes: Yes Employment Status: Employed Name of Employer: [employer]-3rd shift Length of Employment: 34 Return to Work Plans: He reports he has 182 days to return to work befor ehe goes into [employer]-he could retire with full benefits but really likes his job Public relations account executive Issues: No issues Guardian/Conservator: None-according to MD pt is capable of making his own decisions while here   Abuse/Neglect Abuse/Neglect Assessment Can Be Completed: Yes Physical Abuse: Denies Verbal Abuse: Denies Sexual Abuse: Denies Exploitation of patient/patient's resources: Denies Self-Neglect: Denies  Patient response to: Social Isolation - How often do you feel lonely or isolated from those around you?: Sometimes  Emotional Status Pt's affect, behavior and adjustment status: Pt is very independent and was doing well up until a few months ago when he started having pain in his foot and went to the MD and he was diagnosed right.  He has always taken care of himself and did what he wanted to do. Recent Psychosocial Issues: health issues since 05/2021-foot issue and COVID started requring O2  then Psychiatric History: No history due to all that pt has been through he would benefit from seeing neuro-psych while here. Will place him on the list Substance Abuse History: No issues  Patient / Family Perceptions, Expectations & Goals Pt/Family understanding of illness & functional limitations: Pt is able to explain his amputation and does talk with the MD daily and feels he has a good understanding of his treatment plan moving forward. He wants to know what is going on even if not good news. He feels he was told misinformation and is not happy about this Premorbid pt/family roles/activities: Cousin, employee, nieghbor, friend, etc Anticipated changes in roles/activities/participation: resume Pt/family expectations/goals: Pt states: " I hope to do well I need to be able to get myself around to be able to go home safely."  [employer]: Other (Comment) ([facility] in [place] [address] T,TH Sat) Premorbid Home Care/DME Agencies: Other (Comment) (Active with [facility] and has rw and home O2 from [employer]) Transportation available at discharge: Pt drove will need to sign up for transport for HD until he can drive again Is the patient able to respond to transportation needs?: Yes In the past 12 months, has lack of transportation kept you from medical appointments or from getting medications?: No In the past 12 months, has lack of transportation kept you from meetings, work, or from getting things needed for daily living?: No Resource referrals recommended: Neuropsychology  Discharge Planning Living Arrangements: Alone Support Systems: Other relatives, Friends/neighbors Type of Residence: Private residence Insurance Resources: Multimedia programmer (specify), Medicare Nurse, mental health) Financial Resources: Employment Financial Screen Referred: No Living Expenses: Own Money Management: Patient Does the patient have any problems obtaining your medications?: No Home Management:  Self Patient/Family Preliminary Plans: Return home with intermittent assist from cousin and neighbors/friends. He will need to be mod/i level to be safe home alone. He was very independent prior to admission and a few months ago still worked full time and went to the gym along with driving himself to HD. Care Coordinator Barriers to Discharge: Lack of/limited family support Care Coordinator Anticipated Follow Up Needs: HH/OP, Support Group  Clinical Impression Pleasant gentleman who unfortunately did what he was suppose to do and still lost his leg. He is motivated to recover and do for himself again. He has limited supports via cousin, neighbors and friends. He will need to be mod/I at discharge to be safe to be alone. Would benefit from seeing neuro-psych while here for coping since much has happened to him in the past few months. Will await therapy team evaluations and work on discharge needs.  Elease Hashimoto 08/13/2021, 10:28 AM

## 2021-08-13 NOTE — H&P (Signed)
Inpatient Rehabilitation  Patient information reviewed and entered into eRehab system by Ingra Rother Lisha Vitale, OTR/L.   Information including medical coding, functional ability and quality indicators will be reviewed and updated through discharge.    

## 2021-08-13 NOTE — Evaluation (Signed)
Physical Therapy Assessment and Plan  Patient Details  Name: Scott Castro MRN: [id number] Date of Birth: 13-Apr-1956  PT Diagnosis: Abnormality of gait, Contracture of joint: knees, Difficulty walking, Edema, and Pain in residual limb Rehab Potential: Good ELOS: 12-14 days   Today's Date: 08/13/2021 PT Individual Time: 5916-3846 PT Individual Time Calculation (min): 66 min    Hospital Problem: Principal Problem:   S/P BKA (below knee amputation), right ([place]) Active Problems:   Ischemic foot ulcer due to atherosclerosis of native artery of limb ([place])   Past Medical History:  Past Medical History:  Diagnosis Date   Barrett's esophagus    Chronic kidney disease    CHRONIC\   Glomerulosclerosis, focal    Hyperparathyroidism due to renal insufficiency [facility])    Past Surgical History:  Past Surgical History:  Procedure Laterality Date   AMPUTATION Right 08/07/2021   Procedure: AMPUTATION BELOW KNEE;  Surgeon: Algernon Huxley, MD;  Location: [facility] ORS;  Service: Vascular;  Laterality: Right;   AV FISTULA PLACEMENT     COLONOSCOPY N/A 03/22/2016   Procedure: COLONOSCOPY;  Surgeon: Manya Silvas, MD;  Location: [place];  Service: Endoscopy;  Laterality: N/A;   DG AV DIALYSIS GRAFT DECLOT OR     ESOPHAGOGASTRODUODENOSCOPY (EGD) WITH PROPOFOL  03/22/2016   Procedure: ESOPHAGOGASTRODUODENOSCOPY (EGD) WITH PROPOFOL;  Surgeon: Manya Silvas, MD;  Location: [facility] ENDOSCOPY;  Service: Endoscopy;;   FLEXIBLE BRONCHOSCOPY     KIDNEY TRANSPLANT Right 1985   LOWER EXTREMITY ANGIOGRAPHY Right 07/20/2021   Procedure: Lower Extremity Angiography;  Surgeon: Algernon Huxley, MD;  Location: [place] CV LAB;  Service: Cardiovascular;  Laterality: Right;   LOWER EXTREMITY ANGIOGRAPHY Right 08/06/2021   Procedure: Lower Extremity Angiography;  Surgeon: Algernon Huxley, MD;  Location: [place] CV LAB;  Service: Cardiovascular;  Laterality: Right;   REMOVAL TENCKHOFF CATH      Assessment &  Plan Clinical Impression: Scott Castro is a 66 year old male with a history of peripheral vascular disease followed by Dr. Daylene Katayama for ongoing right foot pain. He initially presented to [facility] emergency department on 07/18/2021 with intractable right foot pain. ABIs performed on 07/14/2021 showed 0 right great toe pressure and an ABI of 0.5. He was admitted and started on heparin infusion for criticial limb ischemia. A vascular surgery consultation was obtained by Dr. Trula Slade and he underwent arteriogram with intervention on 07/20/2021 by Dr. Lucky Cowboy. He underwent right TP trunk, proximal peroneal and right popliteal artery angioplasty. A cardiology consultation was obtained on 07/19/2021 for history of atrial fibrillation and elevated heart rate to 144 bpm. He was started on Cardizem infusion. He had no chest pain and blood pressure was soft so metoprolol and enalapril were held.  History of ESRD on chronic iHD on Tuesdays, Thursdays and Saturdays followed by [facility] nephrology, Dr. Radene Knee.  He has a left upper arm arteriovenous fistula.  Nephrology was consulted for routine dialysis orders.  The heparin infusion was discontinued and he was started on Plavix and statin.  Aspirin therapy was deferred due to initiation of apixaban for his atrial fibrillation.  This was initiated on 12/13.  His metoprolol was continued that day as well.  [name] infusion was discontinued and he was ready for discharge home on 12/15 with these medications as well as diltiazem.   He represented to [facility] emergency department on 07/2821 with persistent right foot pain and right calf swelling.  On physical examination he had acute ischemic changes to his  right great toe and second toes.  There was swelling and erythema of the dorsum of the right foot.  Heparin infusion was started. He was admitted and vascular surgery re-consulted.  His foot was ruborous and somewhat swollen on exam nation.  Cool to touch and no palpable pulses.  He underwent  arteriogram on 12/29 by Dr. Lucky Cowboy with right TP trunk and posterior tibial artery angioplasty as well as stent placement to the right external iliac artery.  Right lower extremity venous Doppler ultrasound negative for DVT.  On 12/30, he was taken to the operating room and underwent right below the knee amputation by Dr. Lucky Cowboy.  His heparin infusion continued postoperatively.  This was discontinued on 1231 and his Eliquis was resumed.   Significant past medical history: Admitted on 07/03/2021 with SOB and treated for Covid-19 related pneumonia with history of history of chronic obstructive pulmonary disease/asthma.  Currently on 3 L oxygen supplement mentation via nasal cannula.  He does not use home oxygen.  He underwent CT scan of the chest on 12/21 with findings of loculated left pleural effusion, subpleural scarring noted along the peripheral right chest, increase in size of chest lymph nodes as compared to CT on 11/26/2020.  38-monthfollow-up suggested.  SARS corona virus PCR -12/10 and 12/28. History of Barrett's esophagus, renal osteodystrophy, cardiomegaly, secondary hyperparathyroidism. [facility]   Past surgical history: Prior kidney transplant approximately 1985.  Kidney failed in the early 2000's.  He underwent peritoneal dialysis catheter placement and peritoneal dialysis.   Patient transferred to CIR on 08/12/2021 .    Patient currently requires min with mobility secondary to muscle weakness and muscle joint tightness and decreased standing balance and decreased balance strategies.  Prior to hospitalization, patient was independent  with mobility and lived with Alone in a House home.  Home access is 1Stairs to enter.  Patient will benefit from skilled PT intervention to maximize safe functional mobility, minimize fall risk, and decrease caregiver burden for planned discharge home with 24 hour supervision.  Anticipate patient  will benefit from follow up HBlakelyat discharge.  PT - End of Session Activity Tolerance: Tolerates 30+ min activity with multiple rests Endurance Deficit: Yes Endurance Deficit Description: Pt fatigues quickly, continues to require supp O2 PT Assessment Rehab Potential (ACUTE/IP ONLY): Good PT Barriers to Discharge: [place];Decreased caregiver support;Home environment access/layout;Wound Care;Lack of/limited family support;Hemodialysis PT Patient demonstrates impairments in the following area(s): Balance;Safety;Edema PT Transfers Functional Problem(s): Bed Mobility;Bed to Chair;Car PT Locomotion Functional Problem(s): Wheelchair Mobility;Ambulation;Stairs PT Plan PT Intensity: Minimum of 1-2 x/day ,45 to 90 minutes PT Frequency: 5 out of 7 days PT Duration Estimated Length of Stay: 12-14 days PT Treatment/Interventions: Ambulation/gait training;Cognitive remediation/compensation;Discharge planning;DME/adaptive equipment instruction;Functional mobility training;Pain management;Psychosocial support;Splinting/orthotics;Therapeutic Activities;UE/LE Strength taining/ROM;Balance/vestibular training;Community reintegration;Disease management/prevention;Neuromuscular re-education;Patient/family education;Skin care/wound management;Stair training;Therapeutic Exercise;UE/LE Coordination activities;Wheelchair propulsion/positioning PT Transfers Anticipated Outcome(s): mod I PT Locomotion Anticipated Outcome(s): mod I PT Recommendation Recommendations for Other Services: Neuropsych consult (pt expressed feeling overwhelmed with physical changes) Follow Up Recommendations: Home health PT Patient destination: Home Equipment Recommended: Wheelchair cushion (measurements);Wheelchair (measurements) Equipment Details: 18x18   PT Evaluation Precautions/Restrictions Precautions Precautions: Fall Restrictions Weight Bearing Restrictions: Yes RLE Weight Bearing: Non weight  bearing Other Position/Activity Restrictions: s/p R BKA  Pain Interference Pain Interference Pain Effect on Sleep: 1. Rarely or not at all Pain Interference with Therapy Activities: 1. Rarely or not at all Pain Interference with Day-to-Day Activities: 1. Rarely or not  at all [place]: Alone Available Help at Discharge: Friend(s);Neighbor;Available PRN/intermittently Type of Home: House Home Access: Stairs to enter [employer] of Steps: 1 Entrance Stairs-Rails: None Home Layout: One level Bathroom Shower/Tub: Tub/shower unit  Lives With: Alone Prior Function Level of Independence: Independent with gait;Independent with homemaking with ambulation;Other (comment) (short distance gait only, limited by pain and endurance)  Able to Take Stairs?: Yes Driving: Yes Vocation: Full time employment (has not worked since October) Vocation Requirements: intermittently active, running samples Vision/Perception  Vision - History Ability to See in Adequate Light: 0 Adequate Perception Perception: Within Functional Limits Praxis Praxis: Intact  Cognition Overall Cognitive Status: Within Functional Limits for tasks assessed Arousal/Alertness: Awake/alert Orientation Level: Oriented X4 Year: 2023 Month: January Day of Week: Correct Memory: Appears intact Immediate Memory Recall: Sock;Blue;Bed Memory Recall Sock: Without Cue Memory Recall Blue: Without Cue Memory Recall Bed: Without Cue Awareness: Appears intact Problem Solving: Appears intact Safety/Judgment: Appears intact Sensation Sensation Light Touch: Appears Intact Hot/Cold: Appears Intact Proprioception: Appears Intact Stereognosis: Appears Intact Coordination Gross Motor Movements are Fluid and Coordinated: No Fine Motor Movements are Fluid and Coordinated: Yes Coordination and Movement Description: grossly uncoordinated d/t recent amputation Motor   Motor Motor: Within Functional Limits Motor - Skilled Clinical Observations: altered d/t recent amputation   Trunk/Postural Assessment  Cervical Assessment Cervical Assessment: Within Functional Limits Thoracic Assessment Thoracic Assessment: Within Functional Limits Lumbar Assessment Lumbar Assessment: Within Functional Limits Postural Control Postural Control: Within Functional Limits  Balance Balance Balance Assessed: Yes Static Sitting Balance Static Sitting - Balance Support: No upper extremity supported Static Sitting - Level of Assistance: 5: Stand by assistance Dynamic Sitting Balance Dynamic Sitting - Level of Assistance: 5: Stand by assistance Static Standing Balance Static Standing - Level of Assistance: 4: Min assist Dynamic Standing Balance Dynamic Standing - Level of Assistance: 3: Mod assist Extremity Assessment  RUE Assessment General Strength Comments: 4/5 LUE Assessment General Strength Comments: 4/5 RLE Assessment RLE Assessment: Exceptions to [facility] Passive Range of Motion (PROM) Comments: lacking ~10 deg knee extension General Strength Comments: at least 3/5 knee flexion/extension, no resistance d/t pain RLE Strength Right Hip Flexion: 3+/5 LLE Assessment LLE Assessment: Within Functional Limits Passive Range of Motion (PROM) Comments: lacking ~10 deg knee extension General Strength Comments: grossly 4+/5  Care Tool Care Tool Bed Mobility Roll left and right activity   Roll left and right assist level: Supervision/Verbal cueing    Sit to lying activity   Sit to lying assist level: Supervision/Verbal cueing    Lying to sitting on side of bed activity   Lying to sitting on side of bed assist level: the ability to move from lying on the back to sitting on the side of the bed with no back support.: Supervision/Verbal cueing     Care Tool Transfers Sit to stand transfer   Sit to stand assist level: Minimal Assistance - Patient > 75%    Chair/bed  transfer   Chair/bed transfer assist level: Moderate Assistance - Patient 50 - 74%     Toilet transfer   Assist Level: Moderate Assistance - Patient 50 - 74%    Car transfer   Car transfer assist level: Minimal Assistance - Patient > 75%      Care Tool Locomotion Ambulation Ambulation activity did not occur: Safety/medical concerns        Walk 10 feet activity Walk 10 feet activity did not occur: Safety/medical concerns  Walk 50 feet with 2 turns activity Walk 50 feet with 2 turns activity did not occur: Safety/medical concerns      Walk 150 feet activity Walk 150 feet activity did not occur: Safety/medical concerns      Walk 10 feet on uneven surfaces activity Walk 10 feet on uneven surfaces activity did not occur: Safety/medical concerns      Stairs   Assist level: Minimal Assistance - Patient > 75% Stairs assistive device: Other (comment) (// bars) Max number of stairs: 1 (3 3/4")  Walk up/down 1 step activity   Walk up/down 1 step (curb) assist level: Minimal Assistance - Patient > 75% Walk up/down 1 step or curb assistive device: Other (comment) (// bars)  Walk up/down 4 steps activity Walk up/down 4 steps activity did not occur: Safety/medical concerns      Walk up/down 12 steps activity Walk up/down 12 steps activity did not occur: Safety/medical concerns      Pick up small objects from floor Pick up small object from the floor (from standing position) activity did not occur: Safety/medical concerns      Wheelchair Is the patient using a wheelchair?: Yes Type of Wheelchair: Manual   Wheelchair assist level: Supervision/Verbal cueing Max wheelchair distance: 250 ft  Wheel 50 feet with 2 turns activity   Assist Level: Supervision/Verbal cueing  Wheel 150 feet activity   Assist Level: Supervision/Verbal cueing    Refer to Care Plan for Long Term Goals  SHORT TERM GOAL WEEK 1 PT Short Term Goal 1 (Week 1): Pt will transfer with supervision and  LRAD PT Short Term Goal 2 (Week 1): Pt will navigate 6" curb step with RW PT Short Term Goal 3 (Week 1): Pt will ambulate 20 ft with LRAD  Recommendations for other services: Neuropsych  Skilled Therapeutic Intervention Evaluation completed (see details above and below) with education on PT POC and goals and individual treatment initiated with focus on  transfers, wheelchair mobility, and amputee education. Pt seated in w/c on arrival and agreeable to therapy. Pt reports 2/10 residual limb pain, premedicated. Rest and positioning provided as needed. Subjective exam, MMT and sensation testing performed as documented above and below. Pt propelled w/c with BUE x 250 ft slowly, navigating elevator and multiple turns. Pt was instructed on and performed min a Stand pivot transfer with RW <> EOM. Pt then performed car transfer with Stand pivot transfer technique with RW, min A. Transported to main gym and discussed pt's home set up and steps to enter. Pt then navigated 3 3/4" step with //bars and min A. Pt returned to room and remained in w/c, was left with all needs in reach and alarm active.   Mobility Transfers Transfers: Sit to Stand;Stand to Sit Sit to Stand: Minimal Assistance - Patient > 75% Stand to Sit: Minimal Assistance - Patient > 75% Transfer (Assistive device): Rolling walker Locomotion  Gait Ambulation: No Gait Gait: No Stairs / Additional Locomotion Stairs: No Wheelchair Mobility Wheelchair Mobility: Yes Wheelchair Assistance: Chartered loss adjuster: Both upper extremities Wheelchair Parts Management: Needs assistance Distance: 250 ft   Discharge Criteria: Patient will be discharged from PT if patient refuses treatment 3 consecutive times without medical reason, if treatment goals not met, if there is a change in medical status, if patient makes no progress towards goals or if patient is discharged from hospital.  The above assessment, treatment plan,  treatment alternatives and goals were discussed and mutually agreed upon: by patient  [name]  08/13/2021, 12:44 PM

## 2021-08-13 NOTE — Progress Notes (Signed)
Patient ID: Scott Castro, male   DOB: 09/18/1955, 66 y.o.   MRN: [id number]  Met with patient in room, introduce myself, and explained my role in his care. Stated that I gave him additional educational handouts on Chronic Kidney Disease, End-Stage Kidney Disease, Food Basics for Chronic Kidney Disease, Peripheral Vascular Disease, Barrett's Esophagus, Hyperparathyroidism, Living With COPD, and Understanding Your Risk for Falls. I also stated that I will continue to follow and monitor his progress.  Dorthula Nettles, RN3, BSN, CBIS, [place], [facility], Inpatient Rehabilitation Office [phone] Cell [phone]

## 2021-08-13 NOTE — Progress Notes (Signed)
Physical Therapy Session Note  Patient Details  Name: Scott Castro MRN: [id number] Date of Birth: 09/02/1955  Today's Date: 08/13/2021 PT Individual Time: 1305-1340 PT Individual Time Calculation (min): 35 min   Short Term Goals: Week 1:  PT Short Term Goal 1 (Week 1): Pt will transfer with supervision and LRAD PT Short Term Goal 2 (Week 1): Pt will navigate 6" curb step with RW PT Short Term Goal 3 (Week 1): Pt will ambulate 20 ft with LRAD  Skilled Therapeutic Interventions/Progress Updates:  Pt presented in w/c agreeable to therapy. Pt states pain "feels ok" at start of session. Nsg arrived to provide scheduled pain meds. PTA aware pt scheduled to leave for HD therefore session in room and focused on transfers, standing balance, and residual limb therex. Pt noted to have pillow under R knee, discussed pillow placement so as to encourage TKE and minimize knee contracture. Pt performed LAQ WLP x 10 with pt noted to be unable to reach full extension. Pt performed stand pivot to bed with minA overall. Pt performed x 5 Sit to stand from elevated bed with minA but requiring modA on 5th attempt due to fatigue. Pt demonstrating good hand placement with each trial and use of LUE for controlled descent. While in standing pt performed hip flexion and abd 5-8 reps during x 2 reps standing. Transport arrived and pt performed sit to supine with supervision and use of bed rail with pt using BUE to manage RLE onto bed. Pt positioned to comfort and left in hands of transport to be taken to HD. Pt missed 40 min skilled PT due to HD.   Therapy Documentation Precautions:  Precautions Precautions: Fall Restrictions Weight Bearing Restrictions: Yes RLE Weight Bearing: Non weight bearing Other Position/Activity Restrictions: s/p R BKA General: PT Amount of Missed Time (min): 40 Minutes PT Missed Treatment Reason: Other (Comment) (HD) Vital Signs:   Pain: Pain Assessment Pain Scale: 0-10 Pain Score: 6   Pain Location: Leg Pain Orientation: Right Pain Intervention(s): Medication (See eMAR) Exercises:   Other Treatments:      Therapy/Group: Individual Therapy  Nur Rabold 08/13/2021, 1:50 PM

## 2021-08-13 NOTE — Progress Notes (Signed)
°  [facility] Progress Note   66 y.o. male PMH COPD Barrett's, hyperparathyroidism, ESRD, failed renal transplant, PAF on Eliquis p/w ischemic foot ulcer -> BKA on 12/30.  Patient receives his dialysis treatments at [place] with [facility].   Dialysis at [facility] with [facility] EDW 68.4kg? EDW before the amputation was 72kg   Elisio 16  Assessment/ Plan:   ESRD - TTS at [facility] w/ [facility] last HD treatment on 12/31 -> 1/3 post dialysis weight 68.4 kg - Plan for HD today Renal osteodystrophy - last Phos 1/1 was 5; will recheck Saturday and adjust binders if needed. [name] 3 tabs TIDM. [name] 60mg  qdaily. Anemia - will dose ESA on Aranesp 37mcg qTues; 10% sats on 1/1.He has not received IV iron. Given he's already had the BKA will dose with Venofer.  PAD with critical limb ischemia s/p rt BKA POD#5 for gangrene PAF - rate controlled on Eliquis GERD COPD  Subjective:   Leg pain especially after manipulation. Denies f/c/n/dyspnea.   Objective:   BP 113/77 (BP Location: Right Arm)    Pulse 92    Temp 98.2 F (36.8 C) (Oral)    Resp 18    Ht 5\' 9"  (1.753 m)    Wt 74.6 kg    SpO2 91%    BMI 24.29 kg/m   Intake/Output Summary (Last 24 hours) at 08/13/2021 0738 Last data filed at 08/13/2021 0731 Gross per 24 hour  Intake 480 ml  Output --  Net 480 ml   Weight change:   Physical Exam: GEN: NAD, A&Ox3, NCAT HEENT: No conjunctival pallor, EOMI NECK: Supple, no thyromegaly LUNGS: CTA B/L no rales, rhonchi or wheezing CV: irregular irregular, crescendo murmur ABD: SNDNT +BS  EXT: right BKA ACCESS: Lt BBT with stents along outflow, pulsatile mid body  Imaging: No results found.  Labs: BMET Recent Labs  Lab 08/06/21 1725 08/08/21 0516 08/09/21 0538  NA 139 136 132*  K 4.3 5.1 4.8  CL 98 97* 94*  CO2 27 27 27   GLUCOSE 69* 91 86  BUN 22 44* 32*  CREATININE 5.19* 7.64* 6.25*  CALCIUM 8.4* 7.5* 7.3*  PHOS 4.7*  --  5.0*   CBC Recent  Labs  Lab 08/06/21 1225 08/08/21 0516 08/09/21 0538  WBC 5.5 7.7 8.3  NEUTROABS 3.3  --  5.9  HGB 9.1* 8.2* 8.5*  HCT 29.1* 26.0* 27.7*  MCV 100.3* 102.4* 101.8*  PLT 255 199 176    Medications:     apixaban  5 mg Oral BID   aspirin EC  81 mg Oral Daily   atorvastatin  10 mg Oral QHS   Chlorhexidine Gluconate Cloth  6 each Topical Once   cinacalcet  60 mg Oral Q supper   [START ON 08/18/2021] Darbepoetin Alfa  60 mcg Subcutaneous Q Tue-1800   diltiazem  180 mg Oral Daily   gabapentin  100 mg Oral BID   metoprolol tartrate  12.5 mg Oral BID   pantoprazole  40 mg Oral Daily   senna-docusate  2 tablet Oral BID   sevelamer carbonate  2,400 mg Oral TID WC      Otelia Santee, MD 08/13/2021, 7:38 AM

## 2021-08-13 NOTE — Progress Notes (Signed)
Inpatient [facility] Individual Statement of Services  Patient Name:  Scott Castro  Date:  08/13/2021  Welcome to the [facility].  Our goal is to provide you with an individualized program based on your diagnosis and situation, designed to meet your specific needs.  With this comprehensive rehabilitation program, you will be expected to participate in at least 3 hours of rehabilitation therapies Monday-Friday, with modified therapy programming on the weekends.  Your rehabilitation program will include the following services:  Physical Therapy (PT), Occupational Therapy (OT), 24 hour per day rehabilitation nursing, Therapeutic Recreaction (TR), Neuropsychology, Care Coordinator, Rehabilitation Medicine, Nutrition Services, and Pharmacy Services  Weekly team conferences will be held on Tuesday to discuss your progress.  Your Inpatient Rehabilitation Care Coordinator will talk with you frequently to get your input and to update you on team discussions.  Team conferences with you and your family in attendance may also be held.  Expected length of stay: 12-14 days  Overall anticipated outcome: Independent with device  Depending on your progress and recovery, your program may change. Your Inpatient Rehabilitation Care Coordinator will coordinate services and will keep you informed of any changes. Your Inpatient Rehabilitation Care Coordinator's name and contact numbers are listed  below.  The following services may also be recommended but are not provided by the [place] will be made to provide these services after discharge if needed.  Arrangements include referral to agencies that provide these services.  Your insurance has been verified to be:  [employer] Your primary doctor is:  Dr Smith Mince  Pertinent  information will be shared with your doctor and your insurance company.  Inpatient Rehabilitation Care Coordinator:  Ovidio Kin, [place] or Emilia Beck  Information discussed with and copy given to patient by: Elease Hashimoto, 08/13/2021, 10:32 AM

## 2021-08-14 DIAGNOSIS — F329 Major depressive disorder, single episode, unspecified: Secondary | ICD-10-CM

## 2021-08-14 DIAGNOSIS — Z89511 Acquired absence of right leg below knee: Secondary | ICD-10-CM | POA: Diagnosis not present

## 2021-08-14 MED ORDER — HYDROCORTISONE ACETATE 25 MG RE SUPP
25.0000 mg | Freq: Two times a day (BID) | RECTAL | Status: DC
  Administered 2021-08-14 – 2021-08-30 (×32): 25 mg via RECTAL
  Filled 2021-08-14 (×32): qty 1

## 2021-08-14 MED ORDER — SODIUM CHLORIDE 0.9 % IV BOLUS
250.0000 mL | Freq: Once | INTRAVENOUS | Status: AC
Start: 2021-08-14 — End: 2021-08-14
  Administered 2021-08-14: 250 mL via INTRAVENOUS

## 2021-08-14 MED ORDER — METHOCARBAMOL 500 MG PO TABS
500.0000 mg | ORAL_TABLET | Freq: Three times a day (TID) | ORAL | Status: DC
  Administered 2021-08-14: 500 mg via ORAL
  Filled 2021-08-14: qty 1

## 2021-08-14 NOTE — Consult Note (Signed)
Neuropsychological Consultation   Patient:   Scott Castro   DOB:   February 03, 1956  MR Number:  [id number]  Location:  [place] [facility] [place] [id number] [place] [address] Dept: [place]: [phone]           Date of Service:   08/14/2021  Start Time:   8 AM End Time:   9 AM  Provider/Observer:  Ilean Skill, Psy.D.       Clinical Neuropsychologist       Billing Code/Service: [phone]  Chief Complaint:    Scott Castro is a 66 year old male with a past history of peripheral vascular disease.  Patient initially presented to [facility] emergency department on 07/18/2021 with intractable right foot pain.  Patient was admitted and started on heparin infusions for critical limb ischemia.  Vascular surgery consulted and patient underwent angiogram with intervention on 07/20/2021.  Patient underwent right below the knee amputation after attempts to salvage limb were unsuccessful.  Patient is now been referred to the comprehensive inpatient rehabilitation unit due to functional deficits in mobility.  Reason for Service:  Patient was referred for neuropsychological consultation due to coping and adjustment issues following right BKA and descriptions of coping difficulties with some depressive type symptoms.  Below see HPI for the current admission.  HPI: Scott Castro is a 66 year old male with a history of peripheral vascular disease followed by Dr. Daylene Katayama for ongoing right foot pain. He initially presented to [facility] emergency department on 07/18/2021 with intractable right foot pain. ABIs performed on 07/14/2021 showed 0 right great toe pressure and an ABI of 0.5. He was admitted and started on heparin infusion for criticial limb ischemia. A vascular surgery consultation was obtained by Dr. Trula Slade and he underwent arteriogram with intervention on 07/20/2021 by Dr. Lucky Cowboy. He underwent right TP  trunk, proximal peroneal and right popliteal artery angioplasty. A cardiology consultation was obtained on 07/19/2021 for history of atrial fibrillation and elevated heart rate to 144 bpm. He was started on Cardizem infusion. He had no chest pain and blood pressure was soft so metoprolol and enalapril were held.  History of ESRD on chronic iHD on Tuesdays, Thursdays and Saturdays followed by [facility] nephrology, Dr. Radene Knee.  He has a left upper arm arteriovenous fistula.  Nephrology was consulted for routine dialysis orders.  The heparin infusion was discontinued and he was started on Plavix and statin.  Aspirin therapy was deferred due to initiation of apixaban for his atrial fibrillation.  This was initiated on 12/13.  His metoprolol was continued that day as well.  Scott Castro infusion was discontinued and he was ready for discharge home on 12/15 with these medications as well as diltiazem.   He represented to [facility] emergency department on 07/2821 with persistent right foot pain and right calf swelling.  On physical examination he had acute ischemic changes to his right great toe and second toes.  There was swelling and erythema of the dorsum of the right foot.  Heparin infusion was started. He was admitted and vascular surgery re-consulted.  His foot was ruborous and somewhat swollen on exam nation.  Cool to touch and no palpable pulses.  He underwent arteriogram on 12/29 by Dr. Lucky Cowboy with right TP trunk and posterior tibial artery angioplasty as well as stent placement to the right external iliac artery.  Right lower extremity venous Doppler ultrasound negative for DVT.  On 12/30, he  was taken to the operating room and underwent right below the knee amputation by Dr. Lucky Cowboy.  His heparin infusion continued postoperatively.  This was discontinued on 1231 and his Eliquis was resumed.  Current Status:  Patient was sitting upright in his bed as I entered the room awake and alert.  Patient reports that he was recently started  on some medication but was unsure what it was and with previous conversations I had with Dr. Alice Rieger the patient was started on Zoloft the day previously.  He acknowledged some difficulty coping and concerned about how his life will change with these medical issues and his BKA.  We worked on coping and adjustment issues with Scott Castro and talked about what to expect going forward and alleviate some of his concerns and fears that were unrealistic at some level.  Behavioral Observation: Scott Castro  presents as a 66 y.o.-year-old Right Caucasian Male who appeared his stated age. his dress was Appropriate and he was Well Groomed and his manners were Appropriate to the situation.  his participation was indicative of Appropriate behaviors.  There were physical disabilities noted.  he displayed an appropriate level of cooperation and motivation.     Interactions:    Active Appropriate  Attention:   within normal limits and attention span and concentration were age appropriate  Memory:   within normal limits; recent and remote memory intact  Visuo-spatial:  not examined  Speech (Volume):  normal  Speech:   normal; normal  Thought Process:  Coherent and Relevant  Though Content:  WNL; not suicidal and not homicidal  Orientation:   person, place, time/date, and situation  Judgment:   Good  Planning:   Fair  Affect:    Depressed  Mood:    Dysphoric  Insight:   Good  Intelligence:   normal  Medical History:   Past Medical History:  Diagnosis Date   Barrett's esophagus    Chronic kidney disease    CHRONIC\   Glomerulosclerosis, focal    Hyperparathyroidism due to renal insufficiency [facility])          Patient Active Problem List   Diagnosis Date Noted   S/P BKA (below knee amputation), right ([place]) 08/12/2021   Ischemic foot ulcer due to atherosclerosis of native artery of limb ([place]) 08/05/2021   Atrial fibrillation ([place]) 08/05/2021   Lower limb ischemia 07/18/2021   Atrial fibrillation  with RVR ([place]) 07/18/2021   Hypoalbuminemia due to protein-calorie malnutrition ([place]) 07/03/2021   Anemia 07/03/2021   PAD (peripheral artery disease) ([place]) 07/03/2021   GERD (gastroesophageal reflux disease) 07/03/2021   Mixed hyperlipidemia 07/03/2021   Bicipital tenosynovitis 12/04/2020   Localized, primary osteoarthritis of hand 12/04/2020   Hyperkalemia    ESRD (end stage renal disease) ([place]) 09/18/2019   COPD (chronic obstructive pulmonary disease) ([place]) 04/10/2019   Nonintractable epilepsy with complex partial seizures ([place]) 08/20/2014   Essential hypertension 01/09/2013   Lung nodule, multiple 07/28/2012   Arteriovenous fistula ([place]) 01/21/2012   At risk for dental problems 01/21/2012   Barrett esophagus 01/21/2012   FSGS (focal segmental glomerulosclerosis) 01/21/2012   Kidney transplanted 01/21/2012     Psychiatric History:  No prior psychiatric history but has experienced difficulties with coping following his right BKA.  Family Med/Psych History:  Family History  Problem Relation Age of Onset   Heart disease Mother    Heart disease Father     Impression/DX:  Scott Castro is a 66 year old male with a past history of peripheral vascular  disease.  Patient initially presented to [facility] emergency department on 07/18/2021 with intractable right foot pain.  Patient was admitted and started on heparin infusions for critical limb ischemia.  Vascular surgery consulted and patient underwent angiogram with intervention on 07/20/2021.  Patient underwent right below the knee amputation after attempts to salvage limb were unsuccessful.  Patient is now been referred to the comprehensive inpatient rehabilitation unit due to functional deficits in mobility.  Patient was sitting upright in his bed as I entered the room awake and alert.  Patient reports that he was recently started on some medication but was unsure what it was and with previous conversations I had  with Dr. Alice Rieger the patient was started on Zoloft the day previously.  He acknowledged some difficulty coping and concerned about how his life will change with these medical issues and his BKA.  We worked on coping and adjustment issues with Scott Castro and talked about what to expect going forward and alleviate some of his concerns and fears that were unrealistic at some level.  Disposition/Plan:  Today we worked on coping and adjustment issues and the patient was open and we got through a number of important topics regarding expectations going forward and his potential inability to return to many activities that he was fearful he would not be able to do.  Diagnosis:    Right BKA with the development of depressive reaction type responses.         Electronically Signed   _______________________ Ilean Skill, Psy.D. Clinical Neuropsychologist

## 2021-08-14 NOTE — Progress Notes (Signed)
°  [facility] Progress Note   66 y.o. male PMH COPD Barrett's, hyperparathyroidism, ESRD, failed renal transplant, PAF on Eliquis p/w ischemic foot ulcer -> BKA on 12/30.  Patient receives his dialysis treatments at [place] with [facility].   Dialysis at [facility] with [facility] EDW 68.4kg? EDW before the amputation was 72kg   Elisio 16  Assessment/ Plan:   ESRD - TTS at [facility] w/ [facility] last HD treatment on 12/31 -> 1/3 post dialysis weight 68.4 kg - Tolerated HD on Thur with net UF 1.5L with no cramping.  - Next HD tomorrow; still need to establish a new EDW post BKA.  Renal osteodystrophy - last Phos 1/1 was 5; will recheck Saturday and adjust binders if needed. [name] 3 tabs TIDM. [name] 60mg  qdaily. Anemia - will dose ESA on Aranesp 38mcg qTues; 10% sats on 1/1.He has not received IV iron. Given he's already had the BKA will dose with Venofer.  PAD with critical limb ischemia s/p rt BKA POD#5 for gangrene PAF - rate controlled on Eliquis GERD COPD  Subjective:   Leg pain especially after manipulation. Denies f/c/n/dyspnea.   Objective:   BP 105/70 (BP Location: Right Arm)    Pulse 91    Temp 98.2 F (36.8 C) (Oral)    Resp 14    Ht 5\' 9"  (1.753 m)    Wt 71.4 kg Comment: wt obtained by mh   SpO2 98%    BMI 23.25 kg/m   Intake/Output Summary (Last 24 hours) at 08/14/2021 0803 Last data filed at 08/13/2021 2111 Gross per 24 hour  Intake 360 ml  Output 1525 ml  Net -1165 ml   Weight change: -0.6 kg  Physical Exam: GEN: NAD, A&Ox3, NCAT HEENT: No conjunctival pallor, EOMI NECK: Supple, no thyromegaly LUNGS: CTA B/L no rales, rhonchi or wheezing CV: irregular irregular, crescendo murmur ABD: SNDNT +BS  EXT: right BKA ACCESS: Lt BBT with stents along outflow, pulsatile mid body  Imaging: No results found.  Labs: BMET Recent Labs  Lab 08/08/21 0516 08/09/21 0538 08/13/21 1240  NA 136 132* 129*  K 5.1 4.8 5.2*  CL 97* 94*  91*  CO2 27 27 25   GLUCOSE 91 86 86  BUN 44* 32* 59*  CREATININE 7.64* 6.25* 8.35*  CALCIUM 7.5* 7.3* 7.0*  PHOS  --  5.0* 5.2*   CBC Recent Labs  Lab 08/08/21 0516 08/09/21 0538 08/13/21 1240  WBC 7.7 8.3 10.0  NEUTROABS  --  5.9  --   HGB 8.2* 8.5* 8.7*  HCT 26.0* 27.7* 28.1*  MCV 102.4* 101.8* 99.6  PLT 199 176 251    Medications:     apixaban  5 mg Oral BID   aspirin EC  81 mg Oral Daily   atorvastatin  10 mg Oral QHS   cinacalcet  60 mg Oral Q supper   [START ON 08/18/2021] Darbepoetin Alfa  60 mcg Subcutaneous Q Tue-1800   diltiazem  180 mg Oral Daily   gabapentin  100 mg Oral BID   metoprolol tartrate  12.5 mg Oral BID   pantoprazole  40 mg Oral Daily   senna-docusate  2 tablet Oral BID   sertraline  50 mg Oral Daily   sevelamer carbonate  2,400 mg Oral TID WC      Ot[place] Santee, MD 08/14/2021, 8:03 AM

## 2021-08-14 NOTE — Progress Notes (Signed)
Pt receives out-pt HD at [facility] on TTS schedule. Pt arrives at 11:30 for 11:45 chair time. Clinic advised pt in inpt rehab at [facility] and will advise clinic of d/c date once known. Will assist as needed.  Melven Sartorius Renal Navigator [phone]

## 2021-08-14 NOTE — Discharge Summary (Signed)
Physician Discharge Summary  Patient ID: MERLYN CONLEY MRN: [id number] DOB/AGE: 67/20/1957 67 y.o.  Admit date: 08/12/2021 Discharge date:   Discharge Diagnoses:  Principal Problem:   S/P BKA (below knee amputation), right ([place]) Active Problems:   ESRD (end stage renal disease) ([place])   PAD (peripheral artery disease) ([place])   Critical limb ischemia of left lower extremity/PAD    Ischemic foot ulcer due to atherosclerosis of native artery of limb (HCC)   Atrial fibrillation (HCC)   Depressive reaction ESRD on hemodialysis Atrial fibrillation Hyperlipidemia COPD Renal osteodystrophy Barrett's esophagus Anemia PAD Hypotension Hemorrhoids Hypoxia   Discharged Condition: fair  Significant Diagnostic Studies: MR FOOT RIGHT WO CONTRAST  Result Date: 08/07/2021 CLINICAL DATA:  Right foot and leg pain. History of dialysis. Foot swelling. EXAM: MRI OF THE RIGHT FOREFOOT WITHOUT CONTRAST TECHNIQUE: Multiplanar, multisequence MR imaging of the right foot was performed. No intravenous contrast was administered. COMPARISON:  None. FINDINGS: Bones/Joint/Cartilage Soft tissue wound at the tip of the great toe. Bone marrow edema in the first proximal and distal phalanx, second middle and distal phalanx, third distal phalanx, fourth middle and distal phalanx and fifth distal phalanx concerning for osteomyelitis. Mild marrow edema in the shaft of the fifth metatarsal and fifth proximal phalanx concerning for stress reaction versus osteomyelitis. Normal alignment. No joint effusion. Ligaments Collateral ligaments are intact.  Lisfranc ligament is intact. Muscles and Tendons Flexor, peroneal and extensor compartment tendons are intact. Muscles are normal. Soft tissue No fluid collection or hematoma. No soft tissue mass. Severe soft tissue edema involving the foot and ankle which may be reactive versus secondary to osteomyelitis. IMPRESSION: 1. Soft tissue wound at the tip of the great toe. Bone marrow  edema in the first proximal and distal phalanx, second middle and distal phalanx, third distal phalanx, fourth middle and distal phalanx and fifth distal phalanx concerning for osteomyelitis. 2. Mild marrow edema in the shaft of the fifth metatarsal and fifth proximal phalanx concerning for stress reaction versus osteomyelitis. 3. Severe soft tissue edema involving the foot and ankle which may be reactive versus secondary to osteomyelitis. Electronically Signed   By: Kathreen Devoid M.D.   On: 08/07/2021 08:01   PERIPHERAL VASCULAR CATHETERIZATION  Result Date: 08/28/2021 DATE OF SERVICE: 08/28/2021  PATIENT:  [name]  66 y.o. male  PRE-OPERATIVE DIAGNOSIS:  Atherosclerosis of native arteries of left lower extremity causing ischemic skin changes  POST-OPERATIVE DIAGNOSIS:  Same  PROCEDURE:  1) US guided right common femoral artery access 2) Aortogram 3) Left lower extremity angiogram with second order cannulation (36mL total contrast) 4) Conscious sedation (31 minutes)  SURGEON:  [name], MD  ASSISTANT: none  ANESTHESIA:   local and IV sedation  ESTIMATED BLOOD LOSS: minimal  LOCAL MEDICATIONS USED:  LIDOCAINE  COUNTS: confirmed correct.  PATIENT DISPOSITION:  PACU - hemodynamically stable.  Delay start of Pharmacological VTE agent (>24hrs) due to surgical blood loss or risk of bleeding: no  INDICATION FOR PROCEDURE: MYCHAEL SMOCK is a 66 y.o. male with ischemic skin changes about the left foot, and non-invasive evidence of peripheral arterial disease. After careful discussion of risks, benefits, and alternatives the patient was offered angiography. The patient understood and wished to proceed.  OPERATIVE FINDINGS: Terminal aorta and iliac arteries: Aorta and iliacs widely patent Renals occluded  Left lower extremity: Common femoral artery: patent without stenosis Profunda femoris artery: patent without stenosis  Superficial femoral artery: patent without stenosis Popliteal artery: patent without  stenosis  Anterior tibial artery: only tibial vessel; occludes at the ankle. Tibioperoneal trunk: occluded Peroneal artery: occluded Posterior tibial artery: occluded Pedal circulation: fills via collaterals  DESCRIPTION OF PROCEDURE: After identification of the patient in the pre-operative holding area, the patient was transferred to the operating room. The patient was positioned supine on the operating room table. Anesthesia was induced. The groins was prepped and draped in standard fashion. A surgical pause was performed confirming correct patient, procedure, and operative location.  The right groin was anesthetized with subcutaneous injection of 1% lidocaine. Using ultrasound guidance, the right common femoral artery was accessed with micropuncture technique. Fluoroscopy was used to confirm cannulation over the femoral head. The 38F sheath was upsized to 77F.  A Benson wire was advanced into the distal aorta. Over the wire an omni flush catheter was advanced to the level of L2. Aortogram was performed - see above for details.  The left common iliac artery was selected with a benson guidewire. The wire was advanced into the common femoral artery. Over the wire the omni flush catheter was advanced into the external iliac artery. Selective angiography was performed - see above for details.  Conscious sedation was administered with the use of IV fentanyl and midazolam under continuous physician and nurse monitoring.  Heart rate, blood pressure, and oxygen saturation were continuously monitored.  Total sedation time was 31 minutes.  Upon completion of the case instrument and sharps counts were confirmed correct. The patient was transferred to the PACU in good condition. I was present for all portions of the procedure.  PLAN: No options for revascularization. Will need a left below knee amputation which is not urgent. He is depressed, and wants to speak to a chaplain and palliative care to discuss options and to help  with spiritual care. Maximal medical therapy for PAD.  [name], MD [facility] Phone Number: [phone] 08/28/2021 1:34 PM    PERIPHERAL VASCULAR CATHETERIZATION  Result Date: 08/06/2021 See surgical note for result.  US Venous Img Lower Right (DVT Study)  Result Date: 08/05/2021 CLINICAL DATA:  Calf swelling. EXAM: RIGHT LOWER EXTREMITY VENOUS DOPPLER ULTRASOUND TECHNIQUE: Gray-scale sonography with compression, as well as color and duplex ultrasound, were performed to evaluate the deep venous system(s) from the level of the common femoral vein through the popliteal and proximal calf veins. COMPARISON:  RIGHT lower extremity venous duplex, 07/04/2021. RIGHT foot XRs, 08/05/2021. FINDINGS: VENOUS Normal compressibility of the RIGHT common femoral, superficial femoral, and popliteal veins, as well as the visualized calf veins. Visualized portions of profunda femoral vein and great saphenous vein unremarkable. No filling defects to suggest DVT on grayscale or color Doppler imaging. Doppler waveforms show normal direction of venous flow, normal respiratory plasticity and response to augmentation. Limited views of the contralateral common femoral vein are unremarkable. OTHER No evidence of superficial thrombophlebitis or abnormal fluid collection. Subcutaneous edema within the distal RIGHT lower extremity. Limitations: none IMPRESSION: No evidence of femoropopliteal DVT within the RIGHT lower extremity. Michaelle Birks, MD [facility] Radiology Electronically Signed   By: Michaelle Birks M.D.   On: 08/05/2021 16:16   DG Foot Complete Right  Result Date: 08/05/2021 CLINICAL DATA:  Infection and pain in a 66 year old male. Discoloration to the tips of the first, second and third toe. EXAM: RIGHT FOOT COMPLETE - 3+ VIEW COMPARISON:  Jan 02, 2021. FINDINGS: Vascular calcifications in the soft tissues. No discrete soft  tissue ulceration visible on  radiograph No bony changes to suggest osteomyelitis or acute bone finding. Osteopenia. Diffuse soft tissue swelling is however noted over the dorsum of the foot and plantar aspect of the foot. This is worse in the forefoot. Diffuse soft tissue swelling also in the visualized portions of the LEFT lower extremity. IMPRESSION: 1. No acute bone finding. No discrete soft tissue ulceration visible on radiograph. 2. Diffuse severe soft tissue swelling over the dorsum of the foot and plantar aspect of the foot and extending into the LEFT lower extremity. Findings may represent severe edema and or infection. Electronically Signed   By: Zetta Bills M.D.   On: 08/05/2021 15:13    Labs:  Basic Metabolic Panel: Recent Labs  Lab 08/25/21 1639 08/26/21 1004 08/27/21 1830 08/29/21 1629 08/31/21 0145  NA 126* 131* 128* 133* 131*  K 4.2 3.6 4.1 3.7 3.9  CL 93* 95* 92* 98 94*  CO2 24 27 23 24 24   GLUCOSE 112* 134* 104* 144* 96  BUN 56* 31* 49* 38* 30*  CREATININE 7.80* 4.82* 6.45* 5.82* 5.01*  CALCIUM 6.4* 7.3* 7.2* 7.3* 7.6*  PHOS  --  2.7 3.5 3.2  --     CBC: Recent Labs  Lab 08/26/21 1004 08/27/21 0515 08/28/21 0441 08/29/21 0451 08/30/21 0652  WBC 7.5   < > 5.4 6.6 8.4  NEUTROABS 5.7  --   --   --   --   HGB 7.2*   < > 8.4* 8.0* 10.4*  HCT 23.1*   < > 26.5* 26.0* 33.1*  MCV 99.1   < > 95.3 96.7 97.6  PLT 276   < > 317 294 287   < > = values in this interval not displayed.    CBG: Recent Labs  Lab 08/26/21 0811  GLUCAP 162*    Brief HPI:   MAXIMIANO LOTT is a 67 y.o. male is a 66 year old male with a history of PAD who initially presented to the [facility] ED with intractable right foot pain. He had no right toe pressure on ABI and underwent arteriogram with angioplasty of right tibial and popliteal arteries. He has history of atrial fibrillation and had elevated heart rate , Cardizem infusion was started. Heparin infusion was initially started then placed on  aspirin, statin and Plavix. Aspirin therapy was then deferred due to his a fib. He was discharged home on 07/23/2021.  He presented again to the [facility] ED with right foot pain. He acute ischemic changes to his right 1-2 toes. Heparin infusion started. He underwent another arteriogram with angioplasty of the right tibial vessels and placement of right EIA stent. On 12/30 he was taken to the operating room and underwent right BKA by Dr. Lucky Cowboy. Heparin was discontinued and his Eliquis was resumed. History of ESRD on HD.   Hospital Course: GRAYSON PFEFFERLE was admitted to rehab 08/12/2021 for inpatient therapies to consist of PT and OT at least three hours five days a week. Past admission physiatrist, therapy team and rehab RN have worked together to provide customized collaborative inpatient rehab. Attention was turned to adequate post-op and phantom pain control. This improved. Nephrology was consulted and his HD continued on Tuesdays, Thursdays and Fridays.  On 1/5 his Norco was changed to oxycodone 5 to 10 mg every 4 hours as needed.  Shrinker stocking was placed.  He had persistent hypoxia by pulse oximetry.  Arterial blood gas obtained on 1/12 after discontinuing supplemental oxygen.  pO2 61.3 The patient states he was  using home supplemental oxygen. His incision continued to heal without signs of infection.  On 1/16, patient reported left great toe pain.  Foot was warm with intact Doppler signals.  Sensorimotor intact.  Vascular consultation obtained. Eliquis was discontinued and the patient was placed on heparin infusion with plans for right lower extremity arteriogram on 1/20.  Developed tachycardia overnight 1/19 and anemia persisted. Given one unit PRBCs in during HD treatment.  The patient underwent aortogram with left lower extremity runoff on 1/20 with findings of severe tibial disease with only anterior tibial artery patent to the level of the ankle.  No revascularization options.  Counseled regarding need for  left below-knee amputation. Cardiology consulted regarding persistent, intermittent tachycardia. With inevitable surgery planned, the patient was transferred to the hospitalist service on  telemetry on 1/22.  Blood pressures were monitored on TID basis and on 1/6 he was noted to have BP of 88/60. His robaxin was held and nephrology notified. He was given NS 250 cc bolus.  On 1/12, he was again noted to be hypotensive and nephrology notified.  He was given albumin IV with good results.  Again noted to be hypotensive on 1/18.  Patient given p.o. liquids and monitored.  Nephrology notified. Started midodrine 10 mg TID.  Rehab course: During patient's stay in rehab weekly team conferences were held to monitor patient's progress, set goals and discuss barriers to discharge. At admission, patient required min A with transfers, sit to stand  He has had improvement in activity tolerance, balance, postural control as well as ability to compensate for deficits. He has had improvement in functional use of RLE as well as improvement in awareness  Disposition: Acute care   Discharge disposition: 02-Transferred to [facility]      Diet: Renal  Special Instructions:  No driving, alcohol consumption or tobacco use.   30-35 minutes were spent on discharge planning and discharge summary.   Allergies as of 08/30/2021   No Known Allergies      Medication List     ASK your doctor about these medications    albuterol 108 (90 Base) MCG/ACT inhaler Commonly known as: VENTOLIN HFA Inhale 2 puffs into the lungs every 6 (six) hours as needed for wheezing or shortness of breath.   apixaban 5 MG Tabs tablet Commonly known as: ELIQUIS Take 1 tablet (5 mg total) by mouth 2 (two) times daily.   atorvastatin 10 MG tablet Commonly known as: LIPITOR Take 1 tablet (10 mg total) by mouth at bedtime.   cinacalcet 60 MG tablet Commonly known as: SENSIPAR Take 60 mg by mouth daily.   diclofenac  Sodium 1 % Gel Commonly known as: VOLTAREN Apply 2 g topically 4 (four) times daily. Use on feet   diltiazem 120 MG 24 hr capsule Commonly known as: CARDIZEM CD Take 1 capsule (120 mg total) by mouth daily. Hold for sbp < 100   feeding supplement Liqd Take 237 mLs by mouth 2 (two) times daily between meals.   gabapentin 100 MG capsule Commonly known as: NEURONTIN Take 1 capsule (100 mg total) by mouth 2 (two) times daily.   HYDROcodone-acetaminophen 7.5-325 MG tablet Commonly known as: NORCO Take 1 tablet by mouth 3 (three) times daily as needed.   methocarbamol 500 MG tablet Commonly known as: ROBAXIN Take 1 tablet (500 mg total) by mouth every 8 (eight) hours as needed for muscle spasms.   metoprolol tartrate 25 MG tablet Commonly known as: LOPRESSOR Take 0.5 tablets (12.5 mg  total) by mouth 2 (two) times daily.   pantoprazole 40 MG tablet Commonly known as: PROTONIX Take 40 mg by mouth daily.   polyethylene glycol 17 g packet Commonly known as: MIRALAX / GLYCOLAX Take 17 g by mouth daily as needed.   senna-docusate 8.6-50 MG tablet Commonly known as: Senokot-S Take 2 tablets by mouth 2 (two) times daily.   sevelamer carbonate 800 MG tablet Commonly known as: RENVELA Take by mouth See admin instructions. Take 2400 mg by mouth three times daily, 1600 mg with snacks        Follow-up Information     Dew, Erskine Squibb, MD. Schedule an appointment as soon as possible for a visit.   Specialties: Vascular Surgery, Radiology, Interventional Cardiology Why: Call office tomorrow for follow-up hospitalization/surgery appointment Contact information: [place] [address] [phone]         Franciso Bend, MD. Schedule an appointment as soon as possible for a visit.   Specialty: Nephrology Why: Call tomorrow for follow-up hospitalization appointment Contact information: [place] [address] [phone]         [place],  [facility] Follow up.   Why: Schedule is Monday,Wednesday, Friday with 10:35 chair time.  Patient needs to arrive at 9:50 for first appointment to complete paperwork. Contact information: [place] [address] [phone]                 Signed: Barbie Banner 08/31/2021, 8:17 AM

## 2021-08-14 NOTE — Progress Notes (Signed)
Physical Therapy Session Note  Patient Details  Name: Scott Castro MRN: [id number] Date of Birth: August 09, 1956  Today's Date: 08/14/2021 PT Individual Time: 0903-1017 PT Individual Time Calculation (min): 74 min   Short Term Goals: Week 1:  PT Short Term Goal 1 (Week 1): Pt will transfer with supervision and LRAD PT Short Term Goal 2 (Week 1): Pt will navigate 6" curb step with RW PT Short Term Goal 3 (Week 1): Pt will ambulate 20 ft with LRAD  Skilled Therapeutic Interventions/Progress Updates:  Patient supine in bed on entrance to room. Patient alert and agreeable to PT session. RN providing morning medications including pain medications as pt experiencing some phantom limb pains and pain with positioning into gravity dependent positioning.  Patient with overall mild pain complaint throughout session.  Therapeutic Activity: Bed Mobility: Patient performed supine --> sit with supervision requiring extra time and use of bedrails to complete. VC/ tc required for min technique. At end of session, pt is able to return to supine with supervision/ CGA. While seated EOB, pt's LLE wrapped with short stretch wrap at 25% stretch for edema mgmt.   Pt also educated on limb protector and donning when OOB. Demonstrated how to don. Pt relates comfort with wear.   Transfers: Patient performed sit<>stand and stand pivot transfer EOB--> w/c using RW with CGA. Bed height elevated and pt initiating with split UE positioning and LUE on walker with RUE pushing from w/c armrest.  Provided verbal cues for technique including forward lean and effort in BLE. Pt also demos uncontrolled sit after 50% descent to sit.   Attempt during session for pt to step up onto 1.5" step using RW. RW placed over step and pt unable to use arm to assist in pushing up UB to upright stance. Pt cued verbally and visually on what therapist is performing including increased forward lean/ hip hinge. Unable to perform after multiple attempts.  Adjustment of session to therex.   Wheelchair Mobility:  Patient propelled wheelchair 100 feet with supervision. Provided vc for slower turns and maneuvering "parking" as well. .  Therapeutic Exercise: Patient performed LAQs with 3# aw to LLE 3x10 and SLR with RLE 3x10. Focus on holds at max range and slow eccentric returns.   Patient supine  in bed at end of session with brakes locked, bed alarm set, and all needs within reach.  Therapy Documentation Precautions:  Precautions Precautions: Fall Restrictions Weight Bearing Restrictions: Yes RLE Weight Bearing: Non weight bearing Other Position/Activity Restrictions: s/p R BKA General:   Pain: Pain Assessment Pain Scale: 0-10 Pain Score: 3  Pain Type: Phantom pain Pain Location: Leg Pain Orientation: Right  Therapy/Group: Individual Therapy  Alger Simons PT, DPT 08/14/2021, 10:25 AM

## 2021-08-14 NOTE — Progress Notes (Signed)
Occupational Therapy Session Note  Patient Details  Name: Scott Castro MRN: [id number] Date of Birth: 1956-04-12  Today's Date: 08/14/2021 OT Individual Time: 3220-2542 OT Individual Time Calculation (min): 68 min    Short Term Goals: Week 1:  OT Short Term Goal 1 (Week 1): Pt will complete stand pivot to toilet using RW with min A. OT Short Term Goal 2 (Week 1): Pt will  sit to stand to RW with S to prep for LB self care., OT Short Term Goal 3 (Week 1): Pt will stand with CGA as he manages clothing over hips during self care tasks. OT Short Term Goal 4 (Week 1): Pt will complete simulated tub bench transfer with CGA.  Skilled Therapeutic Interventions/Progress Updates:  Skilled OT intervention completed with focus on residual limb care, skin inspection/pain management education, pain management, mirror therapy education, and BUE strengthen/endurance. Pt received semi-supine in bed, reporting 9/10 pain in residual limb, with some phantom pain with RN aware and pt not due for meds yet. Pt requesting to stay in bed for therapy to let pain ease. Issued pt limb loss educational packet, with therapist going through critical topics with pt. Pt educated on mirror therapy technique for phantom pain management, with pt reporting that applying tactile input on intact leg improved pain in "toes" on pt's amputated side. Education provided to pt on desensitization for tapping/rubbing, as well as skin inspection using inspection mirror (issued pt a personal mirror) with pt return demonstrating method. Education provided on preventing contractures, and methods to support limb when in bed, with pt able to teach back what contracture is. Pt expressing emotional challenges with having the amputation with several comments made throughout session by pt with pt self-stating "I'm a cripple, and having my butt wiped is humiliating." Therapist provided emotional support for pt through education/discussion of rehab process,  goals for independence with self-care through OT, as well as stages of healing after limb loss. Pt participated in bed level (in long sitting) BUE strengthening/endurance exercises to promote strength needed for functional transfers including the following with a 2 pound dowel:  Bicep flexion 3x20 Chest presses 3x20 Shoulder flexion 3x20  Cues needed for form and positioning throughout. Pt reporting improvement in pain, and was left in long sitting in bed, with bed alarm on and all needs in reach at end of session.   Therapy Documentation Precautions:  Precautions Precautions: Fall Restrictions Weight Bearing Restrictions: Yes RLE Weight Bearing: Non weight bearing Other Position/Activity Restrictions: s/p R BKA  Pain: 9/10 pain in residual limb, improved with mirror therapy, RN aware- pt not due for meds  Therapy/Group: Individual Therapy  Lanitra Battaglini E Ammara Raj 08/14/2021, 7:40 AM

## 2021-08-14 NOTE — Progress Notes (Signed)
PROGRESS NOTE   Subjective/Complaints:  Pt reports good therapy day yesterday, although cut short for HD- Stood 4-5x and got into and out of rehab car.  Needs to have BM today. Also c/o BKA leg jumping a lot- had 10-20 muscle spasms overnight- Pain much better today than yesterday- happy with regimen.   ROS:  Pt denies SOB, abd pain, CP, N/V/C/D, and vision changes   Objective:   No results found. Recent Labs    08/13/21 1240  WBC 10.0  HGB 8.7*  HCT 28.1*  PLT 251   Recent Labs    08/13/21 1240  NA 129*  K 5.2*  CL 91*  CO2 25  GLUCOSE 86  BUN 59*  CREATININE 8.35*  CALCIUM 7.0*    Intake/Output Summary (Last 24 hours) at 08/14/2021 0807 Last data filed at 08/13/2021 2111 Gross per 24 hour  Intake 360 ml  Output 1525 ml  Net -1165 ml        Physical Exam: Vital Signs Blood pressure 105/70, pulse 91, temperature 98.2 F (36.8 C), temperature source Oral, resp. rate 14, height 5\' 9"  (1.753 m), weight 71.4 kg, SpO2 98 %.    General: awake, alert, appropriate, sitting up in bed finished breakfast; Wearing 1L O2 by [place]; frail appearing; NAD HENT: conjugate gaze; oropharynx moist CV: regular rate; no JVD Pulmonary: CTA B/L; no W/R/R- good air movement- decreased at bases B/L  GI: soft, NT, ND, (+)BS Psychiatric: appropriate- much less irritable Neurological: Ox3-  Musculoskeletal:  LLE a few muscle spasms seen- not spasticity based on no increased tone    Left lower leg: Edema present.     Comments: Right BKA incision is well approximated.  Flaps are warm and well perfused.  Left foot sensorimotor intact.  No ischemic skin changes.  Skin:   some mild/trace drainage through kerlex.   Assessment/Plan: 1. Functional deficits which require 3+ hours per day of interdisciplinary therapy in a comprehensive inpatient rehab setting. Physiatrist is providing close team supervision and 24 hour management of  active medical problems listed below. Physiatrist and rehab team continue to assess barriers to discharge/monitor patient progress toward functional and medical goals  Care Tool:  Bathing  Bathing activity did not occur: Safety/medical concerns Body parts bathed by patient: Right arm, Left arm, Abdomen, Chest, Front perineal area, Right upper leg, Left upper leg, Face   Body parts bathed by helper: Buttocks, Left lower leg Body parts n/a: Right lower leg   Bathing assist Assist Level: Minimal Assistance - Patient > 75%     Upper Body Dressing/Undressing Upper body dressing Upper body dressing/undressing activity did not occur (including orthotics): Safety/medical concerns What is the patient wearing?: Pull over shirt    Upper body assist Assist Level: Set up assist    Lower Body Dressing/Undressing Lower body dressing      What is the patient wearing?: Pants     Lower body assist Assist for lower body dressing: Moderate Assistance - Patient 50 - 74%     Toileting Toileting    Toileting assist Assist for toileting: Moderate Assistance - Patient 50 - 74%     Transfers Chair/bed transfer  Transfers assist  Chair/bed transfer assist level: Moderate Assistance - Patient 50 - 74%     Locomotion Ambulation   Ambulation assist   Ambulation activity did not occur: Safety/medical concerns          Walk 10 feet activity   Assist  Walk 10 feet activity did not occur: Safety/medical concerns        Walk 50 feet activity   Assist Walk 50 feet with 2 turns activity did not occur: Safety/medical concerns         Walk 150 feet activity   Assist Walk 150 feet activity did not occur: Safety/medical concerns         Walk 10 feet on uneven surface  activity   Assist Walk 10 feet on uneven surfaces activity did not occur: Safety/medical concerns         Wheelchair     Assist Is the patient using a wheelchair?: Yes Type of Wheelchair:  Manual    Wheelchair assist level: Supervision/Verbal cueing Max wheelchair distance: 250 ft    Wheelchair 50 feet with 2 turns activity    Assist        Assist Level: Supervision/Verbal cueing   Wheelchair 150 feet activity     Assist      Assist Level: Supervision/Verbal cueing   Blood pressure 105/70, pulse 91, temperature 98.2 F (36.8 C), temperature source Oral, resp. rate 14, height 5\' 9"  (1.753 m), weight 71.4 kg, SpO2 98 %.  Medical Problem List and Plan: 1. Functional deficits secondary to recent right below the knee amputation secondary to critical limb ischemia, peripheral arterial disease, severe right lower extremity tibial artery disease.             -patient may shower but incision must be covered.              -ELOS/Goals: 10-14 days S             -ordered shrinker and limb guard.   Con't CIR_ PT and OT- will determine length of stay next Tuesday. If progressing faster, will d/w therapy.  2.  Antithrombotics: -DVT/anticoagulation:  Mechanical: Sequential compression devices, below knee Left lower extremity Pharmaceutical: Other (comment) apixaban             -antiplatelet therapy: aspirin  3. Pain Management: Hydrocodone. Discussed trying mirror therapy.  1/5- will switch Norco to Oxycodone 5-10 mg q4 hours prn- since pt is out of control. Hopefully, will be able to get better control with increase in meds, but will eval daily. 1/6- pin much better controlled- con't regimen  4. Mood: LCSW to evaluate and provide emotional support             -antipsychotic agents: Not applicable 5. Neuropsych: This patient is capable of making decisions on his own behalf. 6. Skin/Wound Care: Routine skin checks.  Routine incisional care.  Dry gauze and Ace wrap to right residual limb.  1/5- will order shrinker to help with shaping/edema control.  1/6- wearing shrinker and kerlex OVER shrinker as per Dr Sharol Given.  7. Fluids/Electrolytes/Nutrition: Fluid restriction,  monitor intake and daily weight 8.  End-stage renal disease on chronic intermittent hemodialysis.  T/T/S --Consult nephrology 9: Atrial fibrillation: Continue Lopressor, Eliquis  1/5- rate controlled- con't regimen 10: Hyperlipidemia: Continue Lipitor 11: COPD/asthma: albuterol neb PRN 12: Renal osteodystrophy: Renvela and Sensipar as per nephrology 13: Barrett's Esophagus: Continue PPI 14: Anemia, iron deficiency/chronic disease: Aransep and Venofer per nephrology. 15: Peripheral arterial disease s/p right BKA on 12/30.  --  Discussed with Dr. Delana Meyer (on call vascular surgeon for Dr. Lucky Cowboy) regarding antiplatelet therapy>>recommends aspirin 81 mg daily --Follow-up with Dr. Lucky Cowboy as outpatient  82. Depression 1/5- will start Zoloft 50 mg daily since pt admits his mood is "very poor".  17. Muscle spasms 1/6- will make sure muscle relaxants scheduled.      LOS: 2 days A FACE TO FACE EVALUATION WAS PERFORMED  Scott Castro 08/14/2021, 8:07 AM

## 2021-08-14 NOTE — Progress Notes (Addendum)
Notified by nursing staff of low BP reading at 4 pm. Rechecked with manual cuff and remains low at 88/60. He received dose of Robaxin at approximately 1:40 pm. He dialyzed yesterday and is scheduled to go tomorrow after 12 noon. 480 ml of intake charted today. Dinner tray just arrived.  Patient resting in bed without complaints. Alert and oriented. His SaO2 is 100% on 1 L O2 via [place].  His oral mucosa is dry. Notifed Dr. Royce Macadamia and she recommends mini-bolus of NS. (She will order 250 cc.) Will hold Robaxin for now.  Additionally, he had some minor bleeding from hemorrhoid flare as documented by OT. Will start hydrocortisone suppository.

## 2021-08-15 NOTE — Progress Notes (Signed)
Occupational Therapy Session Note  Patient Details  Name: Scott Castro MRN: [id number] Date of Birth: 02-Oct-1955  Today's Date: 08/15/2021 OT Individual Time: 2162-4469 OT Individual Time Calculation (min): 76 min    Short Term Goals: Week 1:  OT Short Term Goal 1 (Week 1): Pt will complete stand pivot to toilet using RW with min A. OT Short Term Goal 2 (Week 1): Pt will  sit to stand to RW with S to prep for LB self care., OT Short Term Goal 3 (Week 1): Pt will stand with CGA as he manages clothing over hips during self care tasks. OT Short Term Goal 4 (Week 1): Pt will complete simulated tub bench transfer with CGA.  Skilled Therapeutic Interventions/Progress Updates:    Pt received seated in w/c, no c/o pain and, agreeable to therapy. Session focus on self-care retraining, activity tolerance, transfer retraining, BUE strengthening in prep for improved ADL/IADL/func mobility performance + decreased caregiver burden. Sat O2 at 98% on 1L via [place] pre activity, at 94% on 1L via [place] post activity.   Pt able to complete oral care with set-up A at sink and light min A to adjust placement of w/c. Completed UB bathing and dressing seated with set-up and LB bathing and dressing with light min A for sit to stand. Washed hair at sink with total A to use hair washing tray, but pt able to assist by holding tray appropriately.  Self-propelled w/c > 5C gym with close S and increased time.   Completed 1x15 of the following: Triceps dips, w/c push-ups, cross body twists with 4 kg medicine ball.  Pt left seated in w/c awaiting following PT session, call bell in reach, and all immediate needs met.    Therapy Documentation Precautions:  Precautions Precautions: Fall Restrictions Weight Bearing Restrictions: Yes RLE Weight Bearing: Non weight bearing Other Position/Activity Restrictions: s/p R BKA  Pain:  Reports mild increase in R residual limb pain, declined intervention and did not rate ADL: See  Care Tool for more details.  Therapy/Group: Individual Therapy  Volanda Napoleon MS, OTR/L  08/15/2021, 6:59 AM

## 2021-08-15 NOTE — Progress Notes (Signed)
°  [facility] Progress Note   66 y.o. male PMH COPD Barrett's, hyperparathyroidism, ESRD, failed renal transplant, PAF on Eliquis p/w ischemic foot ulcer -> BKA on 12/30.  Patient receives his dialysis treatments at [place] with [facility].   Dialysis at [facility] with [facility] EDW 68.4kg? EDW before the amputation was 72kg   Elisio 16  Assessment/ Plan:   ESRD - TTS at [facility] w/ [facility] last HD treatment on 12/31 -> 1/3 post dialysis weight 68.4 kg - Tolerated HD on Thur with net UF 1.5L with no cramping.  - On for  HD today but he will do PT 1st; still need to establish a new EDW post BKA.  Renal osteodystrophy - last Phos 1/1 was 5; will recheck Saturday and adjust binders if needed. [name] 3 tabs TIDM. [name] 60mg  qdaily. Anemia - will dose ESA on Aranesp 74mcg qTues; 10% sats on 1/1.He has not received IV iron. Given he's already had the BKA will dose with Venofer.  PAD with critical limb ischemia s/p rt BKA POD#5 for gangrene PAF - rate controlled on Eliquis GERD COPD  Subjective:   Leg pain especially after manipulation but better this am. Denies f/c/n/dyspnea.   Objective:   BP 106/80    Pulse 92    Temp 97.9 F (36.6 C)    Resp 16    Ht 5\' 9"  (1.753 m)    Wt 71.8 kg    SpO2 94%    BMI 23.38 kg/m   Intake/Output Summary (Last 24 hours) at 08/15/2021 0804 Last data filed at 08/15/2021 0735 Gross per 24 hour  Intake 1078 ml  Output --  Net 1078 ml   Weight change: -2.2 kg  Physical Exam: GEN: NAD, A&Ox3, NCAT HEENT: No conjunctival pallor, EOMI NECK: Supple, no thyromegaly LUNGS: CTA B/L no rales, rhonchi or wheezing CV: irregular irregular, crescendo murmur ABD: SNDNT +BS  EXT: right BKA ACCESS: Lt BBT with stents along outflow, pulsatile mid body  Imaging: No results found.  Labs: BMET Recent Labs  Lab 08/09/21 0538 08/13/21 1240  NA 132* 129*  K 4.8 5.2*  CL 94* 91*  CO2 27 25  GLUCOSE 86 86  BUN 32* 59*   CREATININE 6.25* 8.35*  CALCIUM 7.3* 7.0*  PHOS 5.0* 5.2*   CBC Recent Labs  Lab 08/09/21 0538 08/13/21 1240  WBC 8.3 10.0  NEUTROABS 5.9  --   HGB 8.5* 8.7*  HCT 27.7* 28.1*  MCV 101.8* 99.6  PLT 176 251    Medications:     apixaban  5 mg Oral BID   aspirin EC  81 mg Oral Daily   atorvastatin  10 mg Oral QHS   cinacalcet  60 mg Oral Q supper   [START ON 08/18/2021] Darbepoetin Alfa  60 mcg Subcutaneous Q Tue-1800   diltiazem  180 mg Oral Daily   gabapentin  100 mg Oral BID   hydrocortisone  25 mg Rectal BID   metoprolol tartrate  12.5 mg Oral BID   pantoprazole  40 mg Oral Daily   senna-docusate  2 tablet Oral BID   sertraline  50 mg Oral Daily   sevelamer carbonate  2,400 mg Oral TID WC      Otelia Santee, MD 08/15/2021, 8:04 AM

## 2021-08-15 NOTE — Progress Notes (Signed)
BP this eveining 88/62, HR 100. Irregular HR with hx of afibb. Patient asymptomatic. MD Lovorn notified. Order to hold HS Metoprolol and push fluids. Patient able to drink 2 cups of fluid. Upon recheck BP 98/73. Continue with plan of care.

## 2021-08-15 NOTE — IPOC Note (Signed)
Overall Plan of Care [facility]) Patient Details Name: KALLAN MERRICK MRN: [id number] DOB: 01/07/1956  Admitting Diagnosis: S/P BKA (below knee amputation), right [facility])  Hospital Problems: Principal Problem:   S/P BKA (below knee amputation), right ([place]) Active Problems:   Ischemic foot ulcer due to atherosclerosis of native artery of limb ([place])   Depressive reaction     Functional Problem List: Nursing Edema, Endurance, Medication Management, Pain, Safety, Skin Integrity  PT Balance, Safety, Edema  OT Balance, Endurance, Pain, Motor  SLP    TR         Basic ADLs: OT Bathing, Dressing, Toileting     Advanced  ADLs: OT Simple Meal Preparation, Light Housekeeping     Transfers: PT Bed Mobility, Bed to Chair, Teacher, early years/pre, Tub/Shower     Locomotion: PT Wheelchair Mobility, Ambulation, Stairs     Additional Impairments: OT None  SLP        TR      Anticipated Outcomes Item Anticipated Outcome  Self Feeding no goal, pt is independent  Swallowing      Basic self-care  Mod I  Toileting  Mod I   Bathroom Transfers Mod I  Bowel/Bladder  n/a  Transfers  mod I  Locomotion  mod I  Communication     Cognition     Pain  <3  Safety/Judgment  mod i   Therapy Plan: PT Intensity: Minimum of 1-2 x/day ,45 to 90 minutes PT Frequency: 5 out of 7 days PT Duration Estimated Length of Stay: 12-14 days OT Intensity: Minimum of 1-2 x/day, 45 to 90 minutes OT Frequency: 5 out of 7 days OT Duration/Estimated Length of Stay: 10-14 days     Due to the current state of emergency, patients may not be receiving their 3-hours of Medicare-mandated therapy.   Team Interventions: Nursing Interventions Patient/Family Education, Disease Management/Prevention, Pain Management, Medication Management, Skin Care/Wound Management, Discharge Planning, Psychosocial Support  PT interventions Ambulation/gait training, Cognitive remediation/compensation, Discharge planning, DME/adaptive  equipment instruction, Functional mobility training, Pain management, Psychosocial support, Splinting/orthotics, Therapeutic Activities, UE/LE Strength taining/ROM, Training and development officer, Community reintegration, Disease management/prevention, Neuromuscular re-education, Patient/family education, Skin care/wound management, Stair training, Therapeutic Exercise, UE/LE Coordination activities, Wheelchair propulsion/positioning  OT Interventions Training and development officer, Discharge planning, DME/adaptive equipment instruction, Functional mobility training, Pain management, Patient/family education, Psychosocial support, Self Care/advanced ADL retraining, UE/LE Strength taining/ROM, Therapeutic Exercise, Therapeutic Activities  SLP Interventions    TR Interventions    SW/CM Interventions Discharge Planning, Psychosocial Support, Patient/Family Education   Barriers to Discharge MD  Medical stability, Home enviroment access/loayout, Wound care, Lack of/limited family support, Weight, Hemodialysis, and Weight bearing restrictions  Nursing Decreased caregiver support, Home environment access/layout, Wound Care, Lack of/limited family support, Weight bearing restrictions, Medication compliance Lives in 1 level home with 1 step to enter. Friends, neighbors able to provide intermittent supervision at discharge.  PT Inaccessible home environment, Decreased caregiver support, Home environment access/layout, Wound Care, Lack of/limited family support, Hemodialysis    OT Decreased caregiver support pt lives alone, cousins live 1 hour away  SLP      SW Lack of/limited family support     Team Discharge Planning: Destination: PT-Home ,OT- Home , SLP-  Projected Follow-up: PT-Home health PT, OT-  Home health OT, SLP-  Projected Equipment Needs: PT-Wheelchair cushion (measurements), Wheelchair (measurements), OT- 3 in 1 bedside comode, Tub/shower bench, SLP-  Equipment Details: PT-18x18, OT-   Patient/family involved in discharge planning: PT- Patient,  OT-Patient, SLP-   MD ELOS:  12-14 days Medical Rehab Prognosis:  Good Assessment: Pt is a 66 yr old male with ESRD, Afib,  and R BKA due to severe PAD; also has COPD on 2L home O2-  Started meds for depression and using Oxycodone for pain.   Goals mod I by d.c    See Team Conference Notes for weekly updates to the plan of care

## 2021-08-15 NOTE — Progress Notes (Signed)
PROGRESS NOTE   Subjective/Complaints:  Pt reports doing "great" -just finished with Am therapy and went well- Will need ramp - explained ramp is not covered by insurance, but can speak with SW about other options.  On 2L O2 at home- 1L here.     ROS:  Pt denies SOB, abd pain, CP, N/V/C/D, and vision changes    Objective:   No results found. Recent Labs    08/13/21 1240  WBC 10.0  HGB 8.7*  HCT 28.1*  PLT 251   Recent Labs    08/13/21 1240  NA 129*  K 5.2*  CL 91*  CO2 25  GLUCOSE 86  BUN 59*  CREATININE 8.35*  CALCIUM 7.0*    Intake/Output Summary (Last 24 hours) at 08/15/2021 1355 Last data filed at 08/15/2021 1310 Gross per 24 hour  Intake 775 ml  Output --  Net 775 ml        Physical Exam: Vital Signs Blood pressure 94/65, pulse 98, temperature 98 F (36.7 C), temperature source Oral, resp. rate 18, height 5\' 9"  (1.753 m), weight 71.8 kg, SpO2 100 %.     General: awake, alert, appropriate, frail appearing; sitting up in w/c- just finished therapy; NAD HENT: conjugate gaze; oropharynx moist CV: regular rate in 90s; ; no JVD Pulmonary: CTA B/L; no W/R/R- good air movement- 1L O2- home O2; - decreased at bases GI: soft, NT, ND, (+)BS Psychiatric: appropriate Neurological: Ox3 Psychiatric: appropriate- much less irritable Neurological: Ox3-  Musculoskeletal:  LLE a few muscle spasms seen- not spasticity based on no increased tone    Left lower leg: Edema present.     Comments: Right BKA incision is well approximated.  Flaps are warm and well perfused.  Left foot sensorimotor intact.  No ischemic skin changes.  Skin:   some mild/trace drainage through kerlex.   Assessment/Plan: 1. Functional deficits which require 3+ hours per day of interdisciplinary therapy in a comprehensive inpatient rehab setting. Physiatrist is providing close team supervision and 24 hour management of active medical  problems listed below. Physiatrist and rehab team continue to assess barriers to discharge/monitor patient progress toward functional and medical goals  Care Tool:  Bathing  Bathing activity did not occur: Safety/medical concerns Body parts bathed by patient: Right arm, Left arm, Abdomen, Chest, Right upper leg, Left upper leg, Face   Body parts bathed by helper: Buttocks, Left lower leg Body parts n/a: Right lower leg   Bathing assist Assist Level: Minimal Assistance - Patient > 75%     Upper Body Dressing/Undressing Upper body dressing Upper body dressing/undressing activity did not occur (including orthotics): Safety/medical concerns What is the patient wearing?: Pull over shirt    Upper body assist Assist Level: Set up assist    Lower Body Dressing/Undressing Lower body dressing      What is the patient wearing?: Pants     Lower body assist Assist for lower body dressing: Minimal Assistance - Patient > 75%     Toileting Toileting    Toileting assist Assist for toileting: Minimal Assistance - Patient > 75%     Transfers Chair/bed transfer  Transfers assist     Chair/bed transfer  assist level: Moderate Assistance - Patient 50 - 74%     Locomotion Ambulation   Ambulation assist   Ambulation activity did not occur: Safety/medical concerns  Assist level: Moderate Assistance - Patient 50 - 74% Assistive device: Walker-rolling Max distance: 15   Walk 10 feet activity   Assist  Walk 10 feet activity did not occur: Safety/medical concerns  Assist level: Moderate Assistance - Patient - 50 - 74% Assistive device: Walker-rolling   Walk 50 feet activity   Assist Walk 50 feet with 2 turns activity did not occur: Safety/medical concerns         Walk 150 feet activity   Assist Walk 150 feet activity did not occur: Safety/medical concerns         Walk 10 feet on uneven surface  activity   Assist Walk 10 feet on uneven surfaces activity did  not occur: Safety/medical concerns         Wheelchair     Assist Is the patient using a wheelchair?: Yes Type of Wheelchair: Manual    Wheelchair assist level: Supervision/Verbal cueing Max wheelchair distance: 150    Wheelchair 50 feet with 2 turns activity    Assist        Assist Level: Supervision/Verbal cueing   Wheelchair 150 feet activity     Assist      Assist Level: Supervision/Verbal cueing   Blood pressure 94/65, pulse 98, temperature 98 F (36.7 C), temperature source Oral, resp. rate 18, height 5\' 9"  (1.753 m), weight 71.8 kg, SpO2 100 %.  Medical Problem List and Plan: 1. Functional deficits secondary to recent right below the knee amputation secondary to critical limb ischemia, peripheral arterial disease, severe right lower extremity tibial artery disease.             -patient may shower but incision must be covered.              -ELOS/Goals: 10-14 days S             -ordered shrinker and limb guard.   Continue CIR- PT, OT -   2.  Antithrombotics: -DVT/anticoagulation:  Mechanical: Sequential compression devices, below knee Left lower extremity Pharmaceutical: Other (comment) apixaban             -antiplatelet therapy: aspirin  3. Pain Management: Hydrocodone. Discussed trying mirror therapy.  1/5- will switch Norco to Oxycodone 5-10 mg q4 hours prn- since pt is out of control. Hopefully, will be able to get better control with increase in meds, but will eval daily. 1/7- good pain control- con't regimen  4. Mood: LCSW to evaluate and provide emotional support             -antipsychotic agents: Not applicable 5. Neuropsych: This patient is capable of making decisions on his own behalf. 6. Skin/Wound Care: Routine skin checks.  Routine incisional care.  Dry gauze and Ace wrap to right residual limb.  1/5- will order shrinker to help with shaping/edema control.  1/6- wearing shrinker and kerlex OVER shrinker as per Dr Sharol Given.  7.  Fluids/Electrolytes/Nutrition: Fluid restriction, monitor intake and daily weight 8.  End-stage renal disease on chronic intermittent hemodialysis.  T/T/S --Consult nephrology  1/7- HD today- required IVFs last night due to low BP per PA- likely the muscle relaxant- will stop them 9: Atrial fibrillation: Continue Lopressor, Eliquis  1/7- rate controlled- con't regimen 10: Hyperlipidemia: Continue Lipitor 11: COPD/asthma: albuterol neb PRN 12: Renal osteodystrophy: Renvela and Sensipar as per nephrology 13: Barrett's  Esophagus: Continue PPI 14: Anemia, iron deficiency/chronic disease: Aransep and Venofer per nephrology. 15: Peripheral arterial disease s/p right BKA on 12/30.  --Discussed with Dr. Delana Meyer (on call vascular surgeon for Dr. Lucky Cowboy) regarding antiplatelet therapy>>recommends aspirin 81 mg daily --Follow-up with Dr. Lucky Cowboy as outpatient  46. Depression 1/5- will start Zoloft 50 mg daily since pt admits his mood is "very poor".  17. Muscle spasms 1/6- will make sure muscle relaxants scheduled.    1/7- BP low yesterday- was stopped    LOS: 3 days A FACE TO FACE EVALUATION WAS PERFORMED  Scott Castro 08/15/2021, 1:55 PM

## 2021-08-15 NOTE — Progress Notes (Signed)
Physical Therapy Session Note  Patient Details  Name: Scott Castro MRN: [id number] Date of Birth: 07-14-1956  Today's Date: 08/15/2021 PT Individual Time: 0800-0900 and 1102-1200 PT Individual Time Calculation (min): 60 min and 58 min.  Short Term Goals: Week 1:  PT Short Term Goal 1 (Week 1): Pt will transfer with supervision and LRAD PT Short Term Goal 2 (Week 1): Pt will navigate 6" curb step with RW PT Short Term Goal 3 (Week 1): Pt will ambulate 20 ft with LRAD  Skilled Therapeutic Interventions/Progress Updates:   First session:  Pt presents semi-reclined in bed and agreeable to therapy.Pt required CGA for sup to sit and use of siderail.  Pt sat EOB for donning of limb protector.  Pt transfers sit to stand throughout session w/ min A and verbal cues for initiation.  Pt required min A and RW for pivot bed > w/c and w/c<> mat table. Pt  wheeled x 100' w/ B Ues before fatiguing and required seated rest.  Pt completed w/c mobility into 5th floor gym.  Pt performed LE there ex w/o trunk support for LAQ, hip flexion 3 x 10.  Pt required extended seated rest breaks 2/2 fatigue.  Manual assistance and resistance for R knee flex/ext, w/ statements from pt "that's the most it has moved in awhile."  Pt returned to w/c w/ min A and wheeled to room w/o rest break and supervision.  Pt remained sitting in w/c w/ all needs in reach.  Second session: Pt presents sitting in w/c and agreeable to therapy.  Pt w/ O2 removed ashe had just taken it off.  O2 monitored on RA and 80%.  O2 placed on and sats increased to > 90%.  Monitored HR and 43 bpm, but on other hand 143 bpm.  Fingers cold and warmed w/ HR at 107 bpm.  Nursing in room.  No change in status from pt.  Pt wheeled self to 5th floor gym w/ supervision and occasional verbal cues for negotiation.  Pt performed sit to stand transfer w/ mod to min A w/ cueing for sequencing, unable to lean forward enough.  Pt amb 15' including turn, 8' and 6' w/ extended  seated breaks between trials 2/2 fatigue.  Pt educated on initiation of transfers during rest breaks.  Pt returned to bed after rx for preparation for dialysis.  Pt amb x 5' w/ RW and verbal cues for foot clearance, and walker management, especially during turns.  Pt transfers sit to supine w/ supervision.  Bed alarm on and all needs in reach.  Handed off to nursing.     Therapy Documentation Precautions:  Precautions Precautions: Fall Restrictions Weight Bearing Restrictions: Yes RLE Weight Bearing: Non weight bearing Other Position/Activity Restrictions: s/p R BKA General:   Vital Signs:   Pain: states tiny bit, received pain meds. Pain Assessment Pain Scale: 0-10 Pain Score: 7  Pain Type: Phantom pain Pain Location: Leg Pain Orientation: Right Pain Descriptors / Indicators: Constant Pain Frequency: Constant Pain Onset: On-going Patients Stated Pain Goal: 4 Pain Intervention(s): Medication (See eMAR) Mobility:      Therapy/Group: Individual Therapy  Ladoris Gene 08/15/2021, 9:03 AM

## 2021-08-16 NOTE — Progress Notes (Signed)
Nurse reported that has not been able to reach dialysis today and was supposed to go yesterday. Informed I would attempt to make contact. Called several times without an answer [phone]. Verified that was seen by nephrology this am. Called On call Dr Joelyn Oms. Aware that pt to be seen.  Informed that have been trying to reach dialysis throughout day with no contact. Verified contact number to be correct. Notified nurse of conversation.

## 2021-08-16 NOTE — Progress Notes (Signed)
°   08/16/21 2015  Assess: MEWS Score  Temp 98.1 F (36.7 C)  BP 104/83  Pulse Rate (!) 116  Resp 18  SpO2 97 %  O2 Device Room Air  Assess: MEWS Score  MEWS Temp 0  MEWS Systolic 0  MEWS Pulse 2  MEWS RR 0  MEWS LOC 0  MEWS Score 2  MEWS Score Color Yellow  Assess: if the MEWS score is Yellow or Red  Were vital signs taken at a resting state? Yes  Focused Assessment No change from prior assessment  Does the patient meet 2 or more of the SIRS criteria? No  Does the patient have a confirmed or suspected source of infection? No  Early Detection of Sepsis Score *See Row Information* Low  MEWS guidelines implemented *See Row Information* Yes  Treat  Pain Scale 0-10  Pain Score 0  Take Vital Signs  Increase Vital Sign Frequency  Yellow: Q 2hr X 2 then Q 4hr X 2, if remains yellow, continue Q 4hrs  Escalate  MEWS: Escalate Yellow: discuss with charge nurse/RN and consider discussing with provider and RRT  Notify: Charge Nurse/RN  Name of Charge Nurse/RN Notified Patrici Ranks RN  Date Charge Nurse/RN Notified 08/16/21  Time Charge Nurse/RN Notified 2030  Notify: Provider  Provider Name/Title MD Lovorn  Date Provider Notified 08/16/21  Time Provider Notified 2031  Notification Type Call  Notification Reason Change in status  Provider response No new orders (Advised to given metoprolol if dialysis has not come by 2200)  Date of Provider Response 08/16/21  Time of Provider Response 2032

## 2021-08-16 NOTE — Progress Notes (Signed)
°  [facility] Progress Note   66 y.o. male PMH COPD Barrett's, hyperparathyroidism, ESRD, failed renal transplant, PAF on Eliquis p/w ischemic foot ulcer -> BKA on 12/30.  Patient receives his dialysis treatments at [place] with [facility].   Dialysis at [facility] with [facility] EDW 68.4kg? EDW before the amputation was 72kg   Elisio 16  Assessment/ Plan:   ESRD - TTS at [facility] w/ [facility] last HD treatment on 12/31 -> 1/3 post dialysis weight 68.4 kg - Tolerated HD on Thur with net UF 1.5L with no cramping.  - Unfortunately we were not able to dialyze him on Sat; he is on  for  HD today. Patient has been very understanding. Still need to establish a new EDW post BKA.  Renal osteodystrophy - last Phos 1/1 was 5; will recheck Sunday and adjust binders if needed. [name] 3 tabs TIDM. [name] 60mg  qdaily. Anemia - will dose ESA on Aranesp 42mcg qTues; 10% sats on 1/1.He has not received IV iron. Given he's already had the BKA will dose with Venofer.  PAD with critical limb ischemia s/p rt BKA POD#5 for gangrene PAF - rate controlled on Eliquis GERD COPD  Subjective:   Leg pain especially after manipulation but getting better. Denies f/c/n/dyspnea.   Objective:   BP 109/80 (BP Location: Right Arm)    Pulse (!) 105    Temp 97.7 F (36.5 C) (Oral)    Resp 18    Ht 5\' 9"  (1.753 m)    Wt 71.3 kg    SpO2 96%    BMI 23.21 kg/m   Intake/Output Summary (Last 24 hours) at 08/16/2021 0752 Last data filed at 08/16/2021 3825 Gross per 24 hour  Intake 1314 ml  Output --  Net 1314 ml   Weight change: -0.5 kg  Physical Exam: GEN: NAD, A&Ox3, NCAT HEENT: No conjunctival pallor, EOMI NECK: Supple, no thyromegaly LUNGS: CTA B/L no rales, rhonchi or wheezing CV: irregular irregular, crescendo murmur ABD: SNDNT +BS  EXT: right BKA ACCESS: Lt BBT with stents along outflow, pulsatile mid body  Imaging: No results found.  Labs: BMET Recent Labs  Lab  08/13/21 1240  NA 129*  K 5.2*  CL 91*  CO2 25  GLUCOSE 86  BUN 59*  CREATININE 8.35*  CALCIUM 7.0*  PHOS 5.2*   CBC Recent Labs  Lab 08/13/21 1240  WBC 10.0  HGB 8.7*  HCT 28.1*  MCV 99.6  PLT 251    Medications:     apixaban  5 mg Oral BID   aspirin EC  81 mg Oral Daily   atorvastatin  10 mg Oral QHS   cinacalcet  60 mg Oral Q supper   [START ON 08/18/2021] Darbepoetin Alfa  60 mcg Subcutaneous Q Tue-1800   diltiazem  180 mg Oral Daily   gabapentin  100 mg Oral BID   hydrocortisone  25 mg Rectal BID   metoprolol tartrate  12.5 mg Oral BID   pantoprazole  40 mg Oral Daily   senna-docusate  2 tablet Oral BID   sertraline  50 mg Oral Daily   sevelamer carbonate  2,400 mg Oral TID WC      Otelia Santee, MD 08/16/2021, 7:52 AM

## 2021-08-17 NOTE — Progress Notes (Signed)
Occupational Therapy Session Note  Patient Details  Name: Scott Castro MRN: [id number] Date of Birth: September 16, 1955  Today's Date: 08/17/2021 OT Individual Time: 5277-8242 OT Individual Time Calculation (min): 87 min    Short Term Goals: Week 1:  OT Short Term Goal 1 (Week 1): Pt will complete stand pivot to toilet using RW with min A. OT Short Term Goal 2 (Week 1): Pt will  sit to stand to RW with S to prep for LB self care., OT Short Term Goal 3 (Week 1): Pt will stand with CGA as he manages clothing over hips during self care tasks. OT Short Term Goal 4 (Week 1): Pt will complete simulated tub bench transfer with CGA.  Skilled Therapeutic Interventions/Progress Updates:    Pt greeted at time of session early but agreeable to OT session. Pt c/o RLE residual limb pain at 8/10 at worst point which improved to 3-4/10 during session. Upon inspection of residual limb, noted to bleed through bandage on to pillow and nursing aware, approved OT to wrap with Kerlex which was performed to reinforce limb. Assisted with donning limb guard 2/2 pain bed level and pt performing bed mob supine > sit with Min/Mod A. Sit > stand at [place] with Min/Mod and stand pivot to wheelchair with Min/Mod A and extended time, cues when to weight shift and pivot on LLE. Self propel to sink and set up for oral hygiene and UB bathing. When exiting room, family member/visitor present and remained for session for family ed and training. Pt transported to 4th floor ADL apartment and demonstrated TTB transfer, use of detachable shower head, and other AE in order to shower at home. Extensive discussion regarding home measurements to ensure w/c will fit in bathroom and other areas, provided home measurement sheet as well. Remainder of session focused on BUE strength with 3x5 wheelchair push ups with cues for anterior weight shift, 1x15-20 of the following with 3# bar: bicep curl, chest press, overhead press, FWD circle. Back in room,  scoot/lateral transfer w/c > bed as pt said he got "scared" when trying to stand, performed with Min A. Alarm on call bell in reach.   Therapy Documentation Precautions:  Precautions Precautions: Fall Restrictions Weight Bearing Restrictions: Yes RLE Weight Bearing: Non weight bearing Other Position/Activity Restrictions: s/p R BKA     Therapy/Group: Individual Therapy  [name] 08/17/2021, 10:51 AM

## 2021-08-17 NOTE — Progress Notes (Signed)
Physical Therapy Session Note  Patient Details  Name: [name] REIER MRN: [id number] Date of Birth: 01/28/1956  Today's Date: 08/17/2021 PT Individual Time: 0800-0900, 4503-8882 PT Individual Time Calculation (min): 60 min, 72 min   Short Term Goals: Week 1:  PT Short Term Goal 1 (Week 1): Pt will transfer with supervision and LRAD PT Short Term Goal 2 (Week 1): Pt will navigate 6" curb step with RW PT Short Term Goal 3 (Week 1): Pt will ambulate 20 ft with LRAD  Skilled Therapeutic Interventions/Progress Updates:  Session 1: Pt received in bed, having just returned from HD. Nsg administered morning meds during session. Pt with 4/10 pain at rest, up to 7/10 with activity as pain medication had not had time to work. Rest breaks and distraction for pain management. Sit to stand and Stand pivot transfer with min A EOB<>w/c. Sit to stand to RW at sink with min A. Pt changed pants and shirt with set up assist at w/c level. Therapist changed shrinker sock, providing education on how to apply. Pt then propelled w/c to sink and stood with RW to wash dirty sock with UUE support and instruction throughout. Pt then returned to bed at end of scheduled time, stating pain was too much and he was fatigued. Pt returned to bed with supervision and was left with all needs in reach and alarm active.   Session 2: pt received in bed and agreeable to therapy. Pt reported 4/10 pain on arrival, premedicated. Rest and positioning provided as needed. Stand pivot transfer to W/c with min A from elevated bed. Pt propelled w/c with BUE x 100 ft to therapy gym for endurance and functional mobility. Squat pivot w/c>mat table with CGA. Min A to roll supine<> prone. Sit<>supine with supervision. Pt directed in amputee exercises as follows with VC/TC for technique throughout: -prone hip extension -sidelying hip abduction -mirror therapy to combat phantom pain, with moderate success -Laq to maintain knee ROM -standing 3 way hip    CGA Stand pivot transfer back to w/c pt returned to room and to bed in the same manner and was left with all needs in reach and alarm active.    Therapy Documentation Precautions:  Precautions Precautions: Fall Restrictions Weight Bearing Restrictions: Yes RLE Weight Bearing: Non weight bearing Other Position/Activity Restrictions: s/p R BKA General:       Therapy/Group: Individual Therapy  [name] 08/17/2021, 8:25 AM

## 2021-08-17 NOTE — Progress Notes (Signed)
Patient ID: Scott Castro, male   DOB: 24-Sep-1955, 66 y.o.   MRN: [id number]  Met with pt who had several questions regarding resources at discharge, ramps, transport, etc. Discussed had spoken with HD-SW regarding transport to OP-HD. They do not [place] county lines and pt lives in [place] but goes to HD in [place]. Will get him handicapped [facility] transport resources. Discussed home health services and private duty services. Will make referral for [facility] for MD follow up appointments. He will have his work fax STD forms to this worker so MD can complete them since his other MD is out of the picture now. Will continue to work on discharge needs.

## 2021-08-17 NOTE — Progress Notes (Signed)
°  Scott Castro Progress Note   66 y.o. male PMH COPD Barrett's, hyperparathyroidism, ESRD, failed renal transplant, PAF on Eliquis p/w ischemic foot ulcer -> BKA on 12/30.  Patient receives his dialysis treatments at [place] with [facility].   Dialysis at [facility] with [facility] EDW 68.4kg? EDW before the amputation was 72kg   Elisio 16  Assessment/ Plan:   ESRD - TTS at [facility] w/ [facility] last HD treatment on 12/31 -> 1/3 post dialysis weight 68.4 kg - HD off schedule wee hours Sun AM, getting back on Schedule tomorrow 1/20.23  Renal osteodystrophy - last Phos 1/1 was 5; will recheck Sunday and adjust binders if needed. [name] 3 tabs TIDM. [name] 60mg  qdaily. Anemia - will dose ESA on Aranesp 73mcg qTues; 10% sats on 1/1.He has not received IV iron. Given he's already had the BKA will dose with Venofer.  PAD with critical limb ischemia s/p rt BKA POD#5 for gangrene PAF - rate controlled on Eliquis GERD COPD  Subjective:    Doing OK- finished HD off schedule in wee hours of this AM.  Working with therapies.     Objective:   BP 97/68 (BP Location: Right Arm)    Pulse (!) 117    Temp 98.8 F (37.1 C) (Oral)    Resp 19    Ht 5\' 9"  (1.753 m)    Wt 71.6 kg    SpO2 99%    BMI 23.31 kg/m   Intake/Output Summary (Last 24 hours) at 08/17/2021 1553 Last data filed at 08/17/2021 0730 Gross per 24 hour  Intake 100 ml  Output 2200 ml  Net -2100 ml   Weight change: 2.4 kg  Physical Exam: GEN: NAD, A&Ox3, NCAT HEENT:  NECK: Supple, no thyromegaly LUNGS: CTA B/L no rales, rhonchi or wheezing CV: irregular irregular, crescendo murmur ABD: SNDNT +BS  EXT: right BKA ACCESS: Lt BBT--> distal portion is aneurysmal  Imaging: No results found.  Labs: BMET Recent Labs  Lab 08/13/21 1240  NA 129*  K 5.2*  CL 91*  CO2 25  GLUCOSE 86  BUN 59*  CREATININE 8.35*  CALCIUM 7.0*  PHOS 5.2*   CBC Recent Labs  Lab 08/13/21 1240  WBC 10.0  HGB  8.7*  HCT 28.1*  MCV 99.6  PLT 251    Medications:     apixaban  5 mg Oral BID   aspirin EC  81 mg Oral Daily   atorvastatin  10 mg Oral QHS   cinacalcet  60 mg Oral Q supper   [START ON 08/18/2021] Darbepoetin Alfa  60 mcg Subcutaneous Q Tue-1800   diltiazem  180 mg Oral Daily   gabapentin  100 mg Oral BID   hydrocortisone  25 mg Rectal BID   metoprolol tartrate  12.5 mg Oral BID   pantoprazole  40 mg Oral Daily   senna-docusate  2 tablet Oral BID   sertraline  50 mg Oral Daily   sevelamer carbonate  2,400 mg Oral TID WC      Madelon Lips, MD 08/17/2021, 3:53 PM

## 2021-08-17 NOTE — Progress Notes (Signed)
Patient in hemodialysis at 0300.

## 2021-08-18 DIAGNOSIS — F329 Major depressive disorder, single episode, unspecified: Secondary | ICD-10-CM

## 2021-08-18 DIAGNOSIS — N186 End stage renal disease: Secondary | ICD-10-CM

## 2021-08-18 DIAGNOSIS — Z992 Dependence on renal dialysis: Secondary | ICD-10-CM

## 2021-08-18 DIAGNOSIS — I739 Peripheral vascular disease, unspecified: Secondary | ICD-10-CM

## 2021-08-18 LAB — CBC
HCT: 24.5 % — ABNORMAL LOW (ref 39.0–52.0)
Hemoglobin: 7.7 g/dL — ABNORMAL LOW (ref 13.0–17.0)
MCH: 31.3 pg (ref 26.0–34.0)
MCHC: 31.4 g/dL (ref 30.0–36.0)
MCV: 99.6 fL (ref 80.0–100.0)
Platelets: 359 10*3/uL (ref 150–400)
RBC: 2.46 MIL/uL — ABNORMAL LOW (ref 4.22–5.81)
RDW: 15.7 % — ABNORMAL HIGH (ref 11.5–15.5)
WBC: 7.7 10*3/uL (ref 4.0–10.5)
nRBC: 0 % (ref 0.0–0.2)

## 2021-08-18 LAB — RENAL FUNCTION PANEL
Albumin: 1.8 g/dL — ABNORMAL LOW (ref 3.5–5.0)
Anion gap: 12 (ref 5–15)
BUN: 38 mg/dL — ABNORMAL HIGH (ref 8–23)
CO2: 26 mmol/L (ref 22–32)
Calcium: 7.4 mg/dL — ABNORMAL LOW (ref 8.9–10.3)
Chloride: 94 mmol/L — ABNORMAL LOW (ref 98–111)
Creatinine, Ser: 6.08 mg/dL — ABNORMAL HIGH (ref 0.61–1.24)
GFR, Estimated: 10 mL/min — ABNORMAL LOW (ref >60)
Glucose, Bld: 97 mg/dL (ref 70–99)
Phosphorus: 4.5 mg/dL (ref 2.5–4.6)
Potassium: 4.3 mmol/L (ref 3.5–5.1)
Sodium: 132 mmol/L — ABNORMAL LOW (ref 135–145)

## 2021-08-18 MED ORDER — ALBUMIN HUMAN 25 % IV SOLN
INTRAVENOUS | Status: AC
  Filled 2021-08-18: qty 100

## 2021-08-18 MED ORDER — ALBUMIN HUMAN 25 % IV SOLN
25.0000 g | Freq: Once | INTRAVENOUS | Status: AC
  Administered 2021-08-18: 25 g via INTRAVENOUS

## 2021-08-18 MED ORDER — ALBUMIN HUMAN 25 % IV SOLN: 25.0000 g | Freq: Once | INTRAVENOUS | Status: DC

## 2021-08-18 NOTE — Progress Notes (Signed)
Occupational Therapy Session Note  Patient Details  Name: Scott Castro MRN: [id number] Date of Birth: [birth year]  Today's Date: 08/18/2021 OT Individual Time: 7510-2585 OT Individual Time Calculation (min): 70 min    Short Term Goals: Week 1:  OT Short Term Goal 1 (Week 1): Pt will complete stand pivot to toilet using RW with min A. OT Short Term Goal 2 (Week 1): Pt will  sit to stand to RW with S to prep for LB self care., OT Short Term Goal 3 (Week 1): Pt will stand with CGA as he manages clothing over hips during self care tasks. OT Short Term Goal 4 (Week 1): Pt will complete simulated tub bench transfer with CGA.   Skilled Therapeutic Interventions/Progress Updates:   Pt greeted at time of session  sitting up in wheelchair from previous therapy sessions this am, agreeable to OT session and did not c/o pain throughout. Pt wanting to complete ADL routine, self propelling throughout room/bathroom with occasional assist in small spaces. Pt performing UB/LB bathing at sink level, sit <> stand for washing periarea and buttocks in standing with unilateral support on AD. UB dress set up, LB dress for underwear and pants with pt able to anterior weight shift to thread LLE and residual limb, donned over hips in standing. Focused on BSC/toilet transfers with pt performing wheelchair <> BSC over toilet with Min A and hopping on LLE. Extensive discussion regarding home set up and bathroom set up, pt to have friends/family take measurements and pictures for DC planning. Assisted with hair washing at sink level per pt request and for comfort, pt drying and combing hair for grooming tasks. Squat pivot wheelchair > bed with Min A. Alarm on call bell in reach.   Therapy Documentation Precautions:  Precautions Precautions: Fall Restrictions Weight Bearing Restrictions: Yes RLE Weight Bearing: Non weight bearing Other Position/Activity Restrictions: s/p R BKA     Therapy/Group: Individual  Therapy  [name] 08/18/2021, 7:30 AM

## 2021-08-18 NOTE — Progress Notes (Signed)
Physical Therapy Session Note  Patient Details  Name: Scott Castro MRN: [id number] Date of Birth: Mar 26, 1956  Today's Date: 08/18/2021 PT Individual Time: 0800-0908 PT Individual Time Calculation (min): 68 min   Short Term Goals: Week 1:  PT Short Term Goal 1 (Week 1): Pt will transfer with supervision and LRAD PT Short Term Goal 2 (Week 1): Pt will navigate 6" curb step with RW PT Short Term Goal 3 (Week 1): Pt will ambulate 20 ft with LRAD  Skilled Therapeutic Interventions/Progress Updates:    pt received in bed and agreeable to therapy. Nursing present for meds pass, pt reporting 3/10 pain at rest and did not note extreme exacerbation with activity. Rest and positioning as needed. Pt expressed feeling like he will not be able to return to work or life, as well as several depressed statements about others "with 2 working legs." Therapist attempted to provide emotional support and encouragement but pt was not receptive. Sup<>sit with supervision. Sit to stand from elevated bed/w/c min A, Stand pivot transfer with RW min A throughout session. Pt propelled w/c with BUE to 4 th floor therapy gym for endurance and functional mobility. Sit to stand to RW x 5 and 3 x 3 with cueing and instruction throughout, min A overall for strength and technique. Pt had one instance of becoming SOB, O2=~83% and rose within 90 sec with cues for deep breaths. Irregular HR so unable to obtain. Transitioned to //bars to practice curb navigation. Pt navigated 3.75" step with min A, forward and back. On second attempt, pt's R elbow gave out and knee buckled. Pt assisted to sitting on step and returned to chair with max A x 3 for safety. Pt transported back to room. Discussed what was learned from incident: pt's worst fear, need more endurance, etc. Provided emotional support throughout as pt repeatedly states "I feel like I failed." Pt returned to bed with min A and was left with all needs in reach and alarm active.    Therapy Documentation Precautions:  Precautions Precautions: Fall Restrictions Weight Bearing Restrictions: Yes RLE Weight Bearing: Non weight bearing Other Position/Activity Restrictions: s/p R BKA    Therapy/Group: Individual Therapy  [name] 08/18/2021, 9:09 AM

## 2021-08-18 NOTE — Progress Notes (Signed)
Physical Therapy Session Note  Patient Details  Name: Scott Castro MRN: [id number] Date of Birth: [birth year]  Today's Date: 08/18/2021 PT Individual Time: 0930-0958 PT Individual Time Calculation (min): 28 min   Short Term Goals: Week 1:  PT Short Term Goal 1 (Week 1): Pt will transfer with supervision and LRAD PT Short Term Goal 2 (Week 1): Pt will navigate 6" curb step with RW PT Short Term Goal 3 (Week 1): Pt will ambulate 20 ft with LRAD  Skilled Therapeutic Interventions/Progress Updates:     Pt supine in bed on arrival. On 0.5L supplemental oxygen via [place]. Pt agreeable to PT tx, reporting 5/10 pain in residual limb at rest and 3/10 during session. Pt reporting his earlier fall during prior PT session - reporting no injuries but did scare him. Pt still wanting to participate in this therapy session. Supine<>sit completed with supervision with HOB slightly raised and use of bed rail. Completed lateral scoot transfer with minA from EOB to w/c, towards his L side - required x2 scoots in order to achieve full transfer. Pt propelled himself ~174ft in w/c with supervision using BUE's to propel. In [facility] rehab gym, completed UE there-ex as pt requesting to complete activities in sitting. With 3lb dowel rod, completed bicep curls, shldr press, unilateral hammer curls 1x20 while seated in w/c. Rest breaks as needed. Discussed amputee ed with pt inquiring on prosthetic and his LTG for returning to life mod I. Propelled himself ~30ft with supervision towards his room and then transported remaining distance for time management. Pt requesting to remain seated in w/c for upcoming OT session. All needs within reach, reconnected to wall O2 0.5L. Informed of upcoming therapy schedule.   Therapy Documentation Precautions:  Precautions Precautions: Fall Restrictions Weight Bearing Restrictions: Yes RLE Weight Bearing: Non weight bearing Other Position/Activity Restrictions: s/p R BKA General:     Therapy/Group: Individual Therapy  Alger Simons 08/18/2021, 7:41 AM

## 2021-08-18 NOTE — Progress Notes (Signed)
PROGRESS NOTE   Subjective/Complaints:  Pt states that he "gave out" when up on parallel bars. Left knee buckled and he bumped right leg/limb guard. No adverse sequelae.   ROS: Patient denies fever, rash, sore throat, blurred vision, nausea, vomiting, diarrhea, cough, shortness of breath or chest pain, joint or back pain, headache, or mood change.    Objective:   No results found. No results for input(s): WBC, HGB, HCT, PLT in the last 72 hours.  No results for input(s): NA, K, CL, CO2, GLUCOSE, BUN, CREATININE, CALCIUM in the last 72 hours.   Intake/Output Summary (Last 24 hours) at 08/18/2021 1400 Last data filed at 08/18/2021 1307 Gross per 24 hour  Intake 577 ml  Output --  Net 577 ml        Physical Exam: Vital Signs Blood pressure (!) 103/58, pulse (!) 123, temperature 97.8 F (36.6 C), temperature source Oral, resp. rate (!) 21, height 5\' 9"  (1.753 m), weight 70.4 kg, SpO2 100 %.     Constitutional: No distress . Vital signs reviewed. Frail appearing HEENT: NCAT, EOMI, oral membranes moist Neck: supple Cardiovascular: RRR without murmur. No JVD    Respiratory/Chest: CTA Bilaterally without wheezes or rales. Normal effort    GI/Abdomen: BS +, non-tender, non-distended Ext: no clubbing, cyanosis, or edema Psych: pleasant and cooperative  Musculoskeletal:  LLE a few muscle spasms seen- not spasticity based on no increased tone    Left lower leg: Edema present but improving.   Skin: right stump incision well approximated. Minimal drainage.   Assessment/Plan: 1. Functional deficits which require 3+ hours per day of interdisciplinary therapy in a comprehensive inpatient rehab setting. Physiatrist is providing close team supervision and 24 hour management of active medical problems listed below. Physiatrist and rehab team continue to assess barriers to discharge/monitor patient progress toward functional and  medical goals  Care Tool:  Bathing  Bathing activity did not occur: Safety/medical concerns Body parts bathed by patient: Right arm, Left arm, Abdomen, Chest, Right upper leg, Left upper leg, Face, Front perineal area, Buttocks   Body parts bathed by helper: Left lower leg Body parts n/a: Right lower leg   Bathing assist Assist Level: Minimal Assistance - Patient > 75%     Upper Body Dressing/Undressing Upper body dressing Upper body dressing/undressing activity did not occur (including orthotics): Safety/medical concerns What is the patient wearing?: Pull over shirt    Upper body assist Assist Level: Set up assist    Lower Body Dressing/Undressing Lower body dressing      What is the patient wearing?: Pants, Underwear/pull up     Lower body assist Assist for lower body dressing: Minimal Assistance - Patient > 75%     Toileting Toileting    Toileting assist Assist for toileting: Minimal Assistance - Patient > 75%     Transfers Chair/bed transfer  Transfers assist     Chair/bed transfer assist level: Moderate Assistance - Patient 50 - 74%     Locomotion Ambulation   Ambulation assist   Ambulation activity did not occur: Safety/medical concerns  Assist level: Moderate Assistance - Patient 50 - 74% Assistive device: Walker-rolling Max distance: 15   Walk 10  feet activity   Assist  Walk 10 feet activity did not occur: Safety/medical concerns  Assist level: Moderate Assistance - Patient - 50 - 74% Assistive device: Walker-rolling   Walk 50 feet activity   Assist Walk 50 feet with 2 turns activity did not occur: Safety/medical concerns         Walk 150 feet activity   Assist Walk 150 feet activity did not occur: Safety/medical concerns         Walk 10 feet on uneven surface  activity   Assist Walk 10 feet on uneven surfaces activity did not occur: Safety/medical concerns         Wheelchair     Assist Is the patient using a  wheelchair?: Yes Type of Wheelchair: Manual    Wheelchair assist level: Supervision/Verbal cueing Max wheelchair distance: 150    Wheelchair 50 feet with 2 turns activity    Assist        Assist Level: Supervision/Verbal cueing   Wheelchair 150 feet activity     Assist      Assist Level: Supervision/Verbal cueing   Blood pressure (!) 103/58, pulse (!) 123, temperature 97.8 F (36.6 C), temperature source Oral, resp. rate (!) 21, height 5\' 9"  (1.753 m), weight 70.4 kg, SpO2 100 %.  Medical Problem List and Plan: 1. Functional deficits secondary to recent right below the knee amputation secondary to critical limb ischemia, peripheral arterial disease, severe right lower extremity tibial artery disease.             -patient may shower but incision must be covered.              -ELOS/Goals: 10-14 days S             -continue shrinker and limb guard.   -Continue CIR therapies including PT and OT. Interdisciplinary team conference today to discuss goals, barriers to discharge, and dc planning.   2.  Antithrombotics: -DVT/anticoagulation:  Mechanical: Sequential compression devices, below knee Left lower extremity Pharmaceutical: Other (comment) apixaban             -antiplatelet therapy: aspirin  3. Pain Management: Hydrocodone. Discussed trying mirror therapy.  1/5- will switch Norco to Oxycodone 5-10 mg q4 hours prn- since pt is out of control. Hopefully, will be able to get better control with increase in meds, but will eval daily. 1/10- good pain control- con't regimen  4. Mood: LCSW to evaluate and provide emotional support             -antipsychotic agents: Not applicable 5. Neuropsych: This patient is capable of making decisions on his own behalf. 6. Skin/Wound Care: tolerating limb guard  -wearing shrinker, minimal drainage. Limb edema decreased 7. Fluids/Electrolytes/Nutrition: Fluid restriction, monitor intake and daily weight 8.  End-stage renal disease on  chronic intermittent hemodialysis.  T/T/S -HD at end of day to avoid conflict with therapy 9: Atrial fibrillation: Continue Lopressor, Eliquis  1/10- rate controlled- con't regimen 10: Hyperlipidemia: Continue Lipitor 11: COPD/asthma: albuterol neb PRN 12: Renal osteodystrophy: Renvela and Sensipar as per nephrology 13: Barrett's Esophagus: Continue PPI 14: Anemia, iron deficiency/chronic disease: Aransep and Venofer per nephrology. 15: Peripheral arterial disease s/p right BKA on 12/30.  --Discussed with Dr. Delana Meyer (on call vascular surgeon for Dr. Lucky Cowboy) regarding antiplatelet therapy>>recommends aspirin 81 mg daily --Follow-up with Dr. Lucky Cowboy as outpatient  7. Depression 1/5- will start Zoloft 50 mg daily since pt admits his mood is "very poor".     LOS: 6 days  A FACE TO FACE EVALUATION WAS PERFORMED  Meredith Staggers 08/18/2021, 2:00 PM

## 2021-08-18 NOTE — Progress Notes (Signed)
Patient ID: Scott Castro, male   DOB: 12/07/1955, 66 y.o.   MRN: [id number]  Met with pt regarding team conference update with goals of mod/I level and target discharge date of 1/18. He reports his cousin is coming tomorrow and will be able to get the pictures the therapy team need of his house. He reports his cousin is also looking into a ramp and will need to get someone to do it for him since he does feel he can. Gave pt resource information on transportation, ramps, private duty and independent living referral. He reports he fell in the parallel bars today due to his right arm giving out but had his limb protector on, but is scared him to death. He reports since this time his day has gotten better. Aware staff to try to wean his O2 but difficult to check his O2 on his fingers. He agrees with this. He would like to be able to not have O2 if not needed. Will await equipment needs and continue to work on discharge needs. Aware target discharge date is 1/18. Also received his STD forms from work and have given to [place] to complete. Pt is aware of this.

## 2021-08-18 NOTE — Progress Notes (Signed)
°   [facility] Note   66 y.o. male PMH COPD Barrett's, hyperparathyroidism, ESRD, failed renal transplant, PAF on Eliquis p/w ischemic foot ulcer -> BKA on 12/30.  Patient receives his dialysis treatments at [place] with [facility].   Dialysis at [facility] with [facility] EDW 68.4kg? EDW before the amputation was 72kg   Elisio 16  Assessment/ Plan:   ESRD - TTS at [facility] w/ [facility] last HD treatment on 12/31 -> 1/3 post dialysis weight 68.4 kg - HD off schedule wee hours Sun AM, getting back on Schedule today 1/10.23  Renal osteodystrophy - last Phos 1/1 was 5; will recheck Sunday and adjust binders if needed. [name] 3 tabs TIDM. [name] 60mg  qdaily. Anemia - will dose ESA on Aranesp 85mcg qTues; 10% sats on 1/1.He has not received IV iron. Given he's already had the BKA will dose with Venofer.  PAD with critical limb ischemia s/p rt BKA for gangrene PAF - rate controlled on Eliquis GERD COPD  Subjective:    Feeling well today.  Worked with therapies.  Finished for the day and awaiting dialysis.     Objective:   BP 110/88 (BP Location: Right Arm)    Pulse (!) 108    Temp 97.7 F (36.5 C)    Resp 18    Ht 5\' 9"  (1.753 m)    Wt 72.7 kg    SpO2 100%    BMI 23.67 kg/m   Intake/Output Summary (Last 24 hours) at 08/18/2021 1334 Last data filed at 08/18/2021 1307 Gross per 24 hour  Intake 577 ml  Output --  Net 577 ml   Weight change: -2.1 kg  Physical Exam: GEN: NAD, A&Ox3, NCAT HEENT:  NECK: Supple, no thyromegaly LUNGS: CTA B/L no rales, rhonchi or wheezing CV: irregular irregular, crescendo murmur ABD: SNDNT +BS  EXT:R BKA, 1+ LE edema ACCESS: Lt BBT--> distal portion is aneurysmal  Imaging: No results found.  Labs: BMET Recent Labs  Lab 08/13/21 1240  NA 129*  K 5.2*  CL 91*  CO2 25  GLUCOSE 86  BUN 59*  CREATININE 8.35*  CALCIUM 7.0*  PHOS 5.2*   CBC Recent Labs  Lab 08/13/21 1240  WBC 10.0  HGB 8.7*   HCT 28.1*  MCV 99.6  PLT 251    Medications:     apixaban  5 mg Oral BID   aspirin EC  81 mg Oral Daily   atorvastatin  10 mg Oral QHS   cinacalcet  60 mg Oral Q supper   Darbepoetin Alfa  60 mcg Subcutaneous Q Tue-1800   diltiazem  180 mg Oral Daily   gabapentin  100 mg Oral BID   hydrocortisone  25 mg Rectal BID   metoprolol tartrate  12.5 mg Oral BID   pantoprazole  40 mg Oral Daily   senna-docusate  2 tablet Oral BID   sertraline  50 mg Oral Daily   sevelamer carbonate  2,400 mg Oral TID WC      Madelon Lips, MD 08/18/2021, 1:34 PM

## 2021-08-19 MED ORDER — ALBUMIN HUMAN 25 % IV SOLN
25.0000 g | Freq: Once | INTRAVENOUS | Status: AC
  Administered 2021-08-19: 25 g via INTRAVENOUS
  Filled 2021-08-19: qty 100

## 2021-08-19 NOTE — Progress Notes (Signed)
Occupational Therapy Session Note  Patient Details  Name: Scott Castro MRN: [id number] Date of Birth: 1955-12-01  Today's Date: 08/19/2021 OT Individual Time: 1132-1200 (AM) OT Individual Time Calculation (min): 28 min (AM) Missed 75 mins in PM session per nursing request as pt with low BP   Short Term Goals: Week 1:  OT Short Term Goal 1 (Week 1): Pt will complete stand pivot to toilet using RW with min A. OT Short Term Goal 2 (Week 1): Pt will  sit to stand to RW with S to prep for LB self care., OT Short Term Goal 3 (Week 1): Pt will stand with CGA as he manages clothing over hips during self care tasks. OT Short Term Goal 4 (Week 1): Pt will complete simulated tub bench transfer with CGA.   Skilled Therapeutic Interventions/Progress Updates:    Session 1: Pt greeted at time of additional OT session bed level with visitor leaving, agreeable to additional OT session despite not being scheduled. No pain. Performed bed mobility supervision and donned limb guard with assist 2/2 time constraints. Squat pivot/scoot bed <> wheelchair CGA during session with cues for anterior weight shift. Transported to 4th floor ADL apartment and reviewed DME for home, TTB transfer, and requesting photos and/or dimensions for home bathroom. Transitioned to kitchen and pt education on IADL retraining tips for setting up kitchen at an accessible height, accessing microwave, fridge, etc and pt receptive. Back in room, squat pivot same as above, alarm on call bell in reach.   Session 2: Pt greeted at time of session bed level resting with nursing starting albumin drip, and per nursing requesting to hold therapy at this time 2/2 very low BP. Pt left in nursing care, missed 75 mins of OT.  Therapy Documentation Precautions:  Precautions Precautions: Fall Restrictions Weight Bearing Restrictions: Yes RLE Weight Bearing: Non weight bearing Other Position/Activity Restrictions: s/p R BKA     Therapy/Group:  Individual Therapy  [name] 08/19/2021, 7:38 AM

## 2021-08-19 NOTE — Progress Notes (Signed)
[place] to advise clinic of pt's d/c date from CIR. Clinic aware pt should resume care on 1/19. Will confirm d/c with clinic closer to d/c date. Will assist as needed.   Melven Sartorius Renal Navigator [phone]

## 2021-08-19 NOTE — Progress Notes (Signed)
PROGRESS NOTE   Subjective/Complaints:  No issues overnight. Slept until 0500. Pain seems controlled.  ROS: Patient denies fever, rash, sore throat, blurred vision, nausea, vomiting, diarrhea, cough, shortness of breath or chest pain,   headache, or mood change.    Objective:   No results found. Recent Labs    08/18/21 1413  WBC 7.7  HGB 7.7*  HCT 24.5*  PLT 359    Recent Labs    08/18/21 1412  NA 132*  K 4.3  CL 94*  CO2 26  GLUCOSE 97  BUN 38*  CREATININE 6.08*  CALCIUM 7.4*     Intake/Output Summary (Last 24 hours) at 08/19/2021 0847 Last data filed at 08/19/2021 0700 Gross per 24 hour  Intake 500 ml  Output 2004 ml  Net -1504 ml        Physical Exam: Vital Signs Blood pressure 114/82, pulse 73, temperature 98.5 F (36.9 C), resp. rate 17, height 5\' 9"  (1.753 m), weight 69.8 kg, SpO2 100 %.     Constitutional: No distress . Vital signs reviewed. HEENT: NCAT, EOMI, oral membranes moist Neck: supple Cardiovascular: RRR without murmur. No JVD    Respiratory/Chest: CTA Bilaterally without wheezes or rales. Normal effort    GI/Abdomen: BS +, non-tender, non-distended Ext: no clubbing, cyanosis, or edema Psych: pleasant and cooperative  Musculoskeletal:  LLE a few muscle spasms seen- not spasticity based on no increased tone    Left lower leg: Edema present but improving.   Skin:      Assessment/Plan: 1. Functional deficits which require 3+ hours per day of interdisciplinary therapy in a comprehensive inpatient rehab setting. Physiatrist is providing close team supervision and 24 hour management of active medical problems listed below. Physiatrist and rehab team continue to assess barriers to discharge/monitor patient progress toward functional and medical goals  Care Tool:  Bathing  Bathing activity did not occur: Safety/medical concerns Body parts bathed by patient: Right arm, Left arm,  Abdomen, Chest, Right upper leg, Left upper leg, Face, Front perineal area, Buttocks   Body parts bathed by helper: Left lower leg Body parts n/a: Right lower leg   Bathing assist Assist Level: Minimal Assistance - Patient > 75%     Upper Body Dressing/Undressing Upper body dressing Upper body dressing/undressing activity did not occur (including orthotics): Safety/medical concerns What is the patient wearing?: Pull over shirt    Upper body assist Assist Level: Set up assist    Lower Body Dressing/Undressing Lower body dressing      What is the patient wearing?: Pants, Underwear/pull up     Lower body assist Assist for lower body dressing: Minimal Assistance - Patient > 75%     Toileting Toileting    Toileting assist Assist for toileting: Minimal Assistance - Patient > 75%     Transfers Chair/bed transfer  Transfers assist     Chair/bed transfer assist level: Moderate Assistance - Patient 50 - 74%     Locomotion Ambulation   Ambulation assist   Ambulation activity did not occur: Safety/medical concerns  Assist level: Moderate Assistance - Patient 50 - 74% Assistive device: Walker-rolling Max distance: 15   Walk 10 feet activity  Assist  Walk 10 feet activity did not occur: Safety/medical concerns  Assist level: Moderate Assistance - Patient - 50 - 74% Assistive device: Walker-rolling   Walk 50 feet activity   Assist Walk 50 feet with 2 turns activity did not occur: Safety/medical concerns         Walk 150 feet activity   Assist Walk 150 feet activity did not occur: Safety/medical concerns         Walk 10 feet on uneven surface  activity   Assist Walk 10 feet on uneven surfaces activity did not occur: Safety/medical concerns         Wheelchair     Assist Is the patient using a wheelchair?: Yes Type of Wheelchair: Manual    Wheelchair assist level: Supervision/Verbal cueing Max wheelchair distance: 150    Wheelchair  50 feet with 2 turns activity    Assist        Assist Level: Supervision/Verbal cueing   Wheelchair 150 feet activity     Assist      Assist Level: Supervision/Verbal cueing   Blood pressure 114/82, pulse 73, temperature 98.5 F (36.9 C), resp. rate 17, height 5\' 9"  (1.753 m), weight 69.8 kg, SpO2 100 %.  Medical Problem List and Plan: 1. Functional deficits secondary to recent right below the knee amputation secondary to critical limb ischemia, peripheral arterial disease, severe right lower extremity tibial artery disease.             -patient may shower but incision must be covered.              -ELOS/Goals: 1/18,   Supervision             -Continue CIR therapies including PT, OT   2.  Antithrombotics: -DVT/anticoagulation:  Mechanical: Sequential compression devices, below knee Left lower extremity Pharmaceutical: Other (comment) apixaban             -antiplatelet therapy: aspirin  3. Pain Management:  .  1/5-changed Norco to Oxycodone 5-10 mg q4 hours prn- s  1/11- pain seems controlled. Discussed massage, vision rx  4. Mood: LCSW to evaluate and provide emotional support             -antipsychotic agents: Not applicable 5. Neuropsych: This patient is capable of making decisions on his own behalf. 6. Skin/Wound Care: tolerating shrinker ,using limb guard  -reapplied fresh shrinker today. May need some exterior gauze around shrinker but wound appeared dry today. 7. Fluids/Electrolytes/Nutrition: Fluid restriction, monitor intake and daily weight 8.  End-stage renal disease on chronic intermittent hemodialysis.  T/T/S -HD at end of day to avoid conflicting with therapy 9: Atrial fibrillation: Continue Lopressor, Eliquis  1/11- rate controlled- con't regimen 10: Hyperlipidemia: Continue Lipitor 11: COPD/asthma: albuterol neb PRN 12: Renal osteodystrophy: Renvela and Sensipar as per nephrology 13: Barrett's Esophagus: Continue PPI 14: Anemia, iron  deficiency/chronic disease: Aransep and Venofer per nephrology. 15: Peripheral arterial disease s/p right BKA on 12/30.  --Discussed with Dr. Delana Meyer (on call vascular surgeon for Dr. Lucky Cowboy) regarding antiplatelet therapy>>recommends aspirin 81 mg daily --Follow-up with Dr. Lucky Cowboy as outpatient  85. Depression 1/5- started Zoloft 50 mg daily for reactive depression 1/11 pt fairly up beat today.     LOS: 7 days A FACE TO FACE EVALUATION WAS PERFORMED  Meredith Staggers 08/19/2021, 8:47 AM

## 2021-08-19 NOTE — Progress Notes (Signed)
Call from attendant RN regarding low-grade pressure.  Automated his blood pressure was 73/61 and rechecked with manual cuff 70/52.  The patient is asymptomatic.  He dialyzed yesterday.  Consulted with Dr. Hollie Salk who will order albumin IV.  Continue to monitor.

## 2021-08-19 NOTE — Progress Notes (Signed)
°  [facility] Progress Note   66 y.o. male PMH COPD Barrett's, hyperparathyroidism, ESRD, failed renal transplant, PAF on Eliquis p/w ischemic foot ulcer -> BKA on 12/30.  Patient receives his dialysis treatments at [place] with [facility].   Dialysis at [facility] with [facility] EDW 68.4kg? EDW before the amputation was 72kg   Elisio 16  Assessment/ Plan:   ESRD - TTS at [facility] w/ [facility] - establishing new EDW - back on TTS schedule - next HD 08/20/21  [name]- last Phos 1/1 was 5; will recheck Sunday and adjust binders if needed. [name] 3 tabs TIDM. [name] 60mg  qdaily. Anemia - will dose ESA on Aranesp 49mcg qTues; 10% sats on 1/1.He has not received IV iron. Given he's already had the BKA will dose with Venofer.  PAD with critical limb ischemia s/p rt BKA for gangrene PAF - rate controlled on Eliquis GERD COPD  Subjective:    Feeling well today.  Worked with therapies.  Finished for the day and awaiting dialysis.     Objective:   BP 114/82 (BP Location: Left Arm)    Pulse 73    Temp 98.5 F (36.9 C)    Resp 17    Ht 5\' 9"  (1.753 m)    Wt 69.8 kg    SpO2 100%    BMI 22.72 kg/m   Intake/Output Summary (Last 24 hours) at 08/19/2021 1335 Last data filed at 08/19/2021 1315 Gross per 24 hour  Intake 460 ml  Output 2004 ml  Net -1544 ml   Weight change: -1.2 kg  Physical Exam: GEN: NAD, A&Ox3, NCAT HEENT:  NECK: Supple, no thyromegaly LUNGS: CTA B/L no rales, rhonchi or wheezing CV: irregular irregular, crescendo murmur ABD: SNDNT +BS  EXT:R BKA, 1+ LE edema ACCESS: Lt BBT--> distal portion is aneurysmal  Imaging: No results found.  Labs: BMET Recent Labs  Lab 08/13/21 1240 08/18/21 1412  NA 129* 132*  K 5.2* 4.3  CL 91* 94*  CO2 25 26  GLUCOSE 86 97  BUN 59* 38*  CREATININE 8.35* 6.08*  CALCIUM 7.0* 7.4*  PHOS 5.2* 4.5   CBC Recent Labs  Lab 08/13/21 1240 08/18/21 1413  WBC 10.0 7.7  HGB 8.7* 7.7*  HCT 28.1*  24.5*  MCV 99.6 99.6  PLT 251 359    Medications:     apixaban  5 mg Oral BID   aspirin EC  81 mg Oral Daily   atorvastatin  10 mg Oral QHS   cinacalcet  60 mg Oral Q supper   Darbepoetin Alfa  60 mcg Subcutaneous Q Tue-1800   diltiazem  180 mg Oral Daily   gabapentin  100 mg Oral BID   hydrocortisone  25 mg Rectal BID   metoprolol tartrate  12.5 mg Oral BID   pantoprazole  40 mg Oral Daily   senna-docusate  2 tablet Oral BID   sertraline  50 mg Oral Daily   sevelamer carbonate  2,400 mg Oral TID WC      Madelon Lips, MD 08/19/2021, 1:35 PM

## 2021-08-19 NOTE — Patient Care Conference (Signed)
Inpatient RehabilitationTeam Conference and Plan of Care Update Date: 08/18/2021   Time: 11:39 AM    Patient Name: Scott Castro      Medical Record Number: [id number]  Date of Birth: November 15, 1955 Sex: Male         Room/Bed: 5C06C/5C06C-01 Payor Info: Payor: MEDICARE / Plan: MEDICARE PART A AND B / Product Type: *No Product type* /    Admit Date/Time:  08/12/2021  1:20 PM  Primary Diagnosis:  S/P BKA (below knee amputation), right [facility])  Hospital Problems: Principal Problem:   S/P BKA (below knee amputation), right ([place]) Active Problems:   Ischemic foot ulcer due to atherosclerosis of native artery of limb [facility])   Depressive reaction    Expected Discharge Date: Expected Discharge Date: 08/26/21  Team Members Present: Physician leading conference: Dr. Alger Simons Social Worker Present: Ovidio Kin, LCSW Nurse Present: Dorthula Nettles, RN PT Present: Ailene Rud, PT OT Present: Lillia Corporal, OT PPS Coordinator present : Gunnar Fusi, SLP     Current Status/Progress Goal Weekly Team Focus  Bowel/Bladder   anuric on HD, Continent Bowel-LBM 08/16/21  Remain continent  Assist with toileting needs PRN. offer addional softner PRn   Swallow/Nutrition/ Hydration             ADL's   LB bathe/dress Min A, On O2 but trying to wean, sit <> stands Min/Mod, stand pivot Min A, squat pivots Min A  Mod I  ADL retraining, sit <> stands, stand pivot transfers, IADL retraining, home set up   Mobility   min A sts from elevated surface and w/c, mod/max from low bed, min transfers, supervision bed mobility  mod I, pt lives alone with very little support  transfers, gait, pain management   Communication             Safety/Cognition/ Behavioral Observations            Pain   5 out of 10 at times. Oxy PRN effective  less than 3 out of 10  Assess pain q shift and PRN treat accordingly   Skin   RLE BKA-staples approximated and shrinker. General brusing. some red/purple discoloration noted to  buttocks  no new skin breakdown  assess skin q shift and PRN. Continue to educate on s/s of infections when providing incision care     Discharge Planning:  Home alone with some intermittent checks by friends and neighbors. Pt needs to be mod/i to go home safely. Will need ramp to get back and forth to OP-HD treatments   Team Discussion: Right BKA, incision looks good. Hemodialysis TTS. Depressed. Tachycardic due to hemodialysis. Nursing reports some bleeding at edge of incision, dressed with non-adherent dressing and shrinker. Lives alone, will need a ramp installed. Min/mod assist ADL's, needs assist standing. Need home measurements. Discontinue oxygen, on room air and doing well. Min assist with mobility, working on stairs. Flat affect and negative attitude at times.  Patient on target to meet rehab goals: yes, mod I goals.  *See Care Plan and progress notes for long and short-term goals.   Revisions to Treatment Plan:  None at this time.  Teaching Needs: Family education, medication management, skin/wound care, residual limb care, safety awareness, transfer/gait training, etc.  Current Barriers to Discharge: Decreased caregiver support, Home enviroment access/layout, Wound care, Lack of/limited family support, Hemodialysis, Weight bearing restrictions, and Medication compliance  Possible Resolutions to Barriers: Family education Ramp installation Order recommended DME Outpatient hemodialysis Follow-up Outpatient PT/OT therapy  Medical Summary Current Status: Left BKA---shrinker. mild bleeding on incision. ESRD on HD TTS. flat affect/depressed. pain control reasonable  Barriers to Discharge: Medical stability   Possible Resolutions to [employer] Focus: daily assessment of labs and pt data, ego support, pain control. wound care/shrinker education   Continued Need for Acute Rehabilitation Level of Care: The patient requires daily medical management by a physician with  specialized training in physical medicine and rehabilitation for the following reasons: Direction of a multidisciplinary physical rehabilitation program to maximize functional independence : Yes Medical management of patient stability for increased activity during participation in an intensive rehabilitation regime.: Yes Analysis of laboratory values and/or radiology reports with any subsequent need for medication adjustment and/or medical intervention. : Yes   I attest that I was present, lead the team conference, and concur with the assessment and plan of the team.   Cristi Loron 08/19/2021, 7:36 AM

## 2021-08-19 NOTE — Progress Notes (Signed)
°   08/19/21 1430  Assess: MEWS Score  Temp 98.4 F (36.9 C)  BP (!) 70/52  Pulse Rate 98  Resp 16  Level of Consciousness Alert  SpO2 99 %  O2 Device Room Air  O2 Flow Rate (L/min) 2 L/min  Assess: MEWS Score  MEWS Temp 0  MEWS Systolic 2  MEWS Pulse 0  MEWS RR 0  MEWS LOC 0  MEWS Score 2  MEWS Score Color Yellow  Assess: if the MEWS score is Yellow or Red  Were vital signs taken at a resting state? Yes  Focused Assessment No change from prior assessment  Does the patient meet 2 or more of the SIRS criteria? No  Does the patient have a confirmed or suspected source of infection? No  Early Detection of Sepsis Score *See Row Information* Low  MEWS guidelines implemented *See Row Information* Yes  Treat  MEWS Interventions Other (Comment) (patient baseline)  Pain Scale 0-10  Pain Score 0  Take Vital Signs  Increase Vital Sign Frequency  Yellow: Q 2hr X 2 then Q 4hr X 2, if remains yellow, continue Q 4hrs  Escalate  MEWS: Escalate Yellow: discuss with charge nurse/RN and consider discussing with provider and RRT  Notify: Charge Nurse/RN  Name of Charge Nurse/RN Notified Hilary RN  Date Charge Nurse/RN Notified 08/19/21  Time Charge Nurse/RN Notified 1430  Notify: Provider  Provider Name/Title  (md aware)  Document  Patient Outcome Stabilized after interventions  Progress note created (see row info) Yes    Dayna Ramus

## 2021-08-19 NOTE — Progress Notes (Signed)
Patient blood pressure low at routine vitals. PA aware and orders given, MEWS protocol followed. Patient states he is tired but no other acute changes at this time.

## 2021-08-19 NOTE — Progress Notes (Signed)
Physical Therapy Session Note  Patient Details  Name: Scott Castro MRN: [id number] Date of Birth: [birth year]  Today's Date: 08/19/2021 PT Individual Time: 0801-0917, 1300-13-58 PT Individual Time Calculation (min): 76 min, 58 min   Short Term Goals: Week 1:  PT Short Term Goal 1 (Week 1): Pt will transfer with supervision and LRAD PT Short Term Goal 2 (Week 1): Pt will navigate 6" curb step with RW PT Short Term Goal 3 (Week 1): Pt will ambulate 20 ft with LRAD  Skilled Therapeutic Interventions/Progress Updates:    Session 1:  pt received in bed and agreeable to therapy. Pt reported 4/10 pain with activity, premedicated. Rest and positioning provided as needed. Sup>sit with supervision from flat bed. Stand pivot transfer to w/c with RW min A. Pt on .5L O2 for first part of session. Pt propelled to sink and performed oral hygiene and washed shrinker with set up. Pt propelled w/c with BUE to 4th floor gym with rest breaks. At this time pt reported feeling like O2 is low. Difficult to get reading, but read as low as 75%. Rose to 97 on 2 L. Maintained on 2 L rest of session. Squat pivot to mat table with CGA, VC for set up of transfer. Bed mobility on mat with VC for technique and encouragement Pt performed the following exercises to promote LE strength and endurance: hip abduction, hip extension, supine bridges with bolster. Pt returned to chair in the same manner as above. Pt was directed in donning leg rests and then propelled back to room. VC for improved propulsion efficiency. Pt returned to bed with CGA squat pivot and was left with all needs in reach and alarm active.   Session 2: pt received in bed and agreeable to therapy. NT present for vitals. Noted yellow MEWS BP=89/54 then 77/57 With HOB elevated. RN consulted, who recommended assessing with activity. BP in sitting EOB=89/65. Pt requested to toilet. Squat pivot EOB<>w/c with CGA and VC for sequencing and technique. Stand pivot transfer  w/c<>commode with min A for safety d/t BP concerns. Pt required an extended time for bowel void, min A for clothing management, supervision hygiene. Discussed d/c planning, avoiding valsalva maneuver while having bowel movement d/t blood pressure concerns and hemorrhoids, and safety with transfers. Pt returned to bed with squat pivot. Continues to need cues for sequencing and to lock/unlock w/c brakes. Pt remained in bed, NT notified for vitals. Pt was left with all needs in reach and alarm active.     Therapy Documentation Precautions:  Precautions Precautions: Fall Restrictions Weight Bearing Restrictions: Yes RLE Weight Bearing: Non weight bearing Other Position/Activity Restrictions: s/p R BKA General:   Vital Signs: Therapy Vitals Temp: 98.5 F (36.9 C) Pulse Rate: 73 Resp: 17 BP: 114/82 Patient Position (if appropriate): Lying Oxygen Therapy SpO2: 100 % Pain: Pain Assessment Pain Scale: 0-10 Pain Score: 0-No pain Faces Pain Scale: Hurts a little bit Pain Type: Surgical pain Patients Stated Pain Goal: 4 Pain Intervention(s): Medication (See eMAR) PAINAD (Pain Assessment in Advanced Dementia) Breathing: normal Negative Vocalization: none Facial Expression: smiling or inexpressive Body Language: relaxed Consolability: no need to console PAINAD Score: 0 Mobility:   Locomotion :    Trunk/Postural Assessment :    Balance:   Exercises:   Other Treatments:      Therapy/Group: Individual Therapy  [name] 08/19/2021, 8:49 AM

## 2021-08-19 NOTE — Progress Notes (Signed)
Pt given dose of albumin 25% human solution 25g @60mL /hr via IV infusion. Tolerated well, new vital signs taken. Continuing MEWs protocol.   Dayna Ramus

## 2021-08-20 LAB — CBC
HCT: 25.8 % — ABNORMAL LOW (ref 39.0–52.0)
Hemoglobin: 7.7 g/dL — ABNORMAL LOW (ref 13.0–17.0)
MCH: 29.7 pg (ref 26.0–34.0)
MCHC: 29.8 g/dL — ABNORMAL LOW (ref 30.0–36.0)
MCV: 99.6 fL (ref 80.0–100.0)
Platelets: 430 10*3/uL — ABNORMAL HIGH (ref 150–400)
RBC: 2.59 MIL/uL — ABNORMAL LOW (ref 4.22–5.81)
RDW: 15.8 % — ABNORMAL HIGH (ref 11.5–15.5)
WBC: 9.4 10*3/uL (ref 4.0–10.5)
nRBC: 0 % (ref 0.0–0.2)

## 2021-08-20 LAB — GLUCOSE, CAPILLARY: Glucose-Capillary: 99 mg/dL (ref 70–99)

## 2021-08-20 LAB — RENAL FUNCTION PANEL
Albumin: 2.2 g/dL — ABNORMAL LOW (ref 3.5–5.0)
Anion gap: 12 (ref 5–15)
BUN: 38 mg/dL — ABNORMAL HIGH (ref 8–23)
CO2: 26 mmol/L (ref 22–32)
Calcium: 7.5 mg/dL — ABNORMAL LOW (ref 8.9–10.3)
Chloride: 94 mmol/L — ABNORMAL LOW (ref 98–111)
Creatinine, Ser: 6.16 mg/dL — ABNORMAL HIGH (ref 0.61–1.24)
GFR, Estimated: 9 mL/min — ABNORMAL LOW (ref >60)
Glucose, Bld: 115 mg/dL — ABNORMAL HIGH (ref 70–99)
Phosphorus: 3.4 mg/dL (ref 2.5–4.6)
Potassium: 3.8 mmol/L (ref 3.5–5.1)
Sodium: 132 mmol/L — ABNORMAL LOW (ref 135–145)

## 2021-08-20 LAB — BLOOD GAS, ARTERIAL
Acid-Base Excess: 2.6 mmol/L — ABNORMAL HIGH (ref 0.0–2.0)
Bicarbonate: 26 mmol/L (ref 20.0–28.0)
Drawn by: 35849
FIO2: 21
O2 Saturation: 92 %
Patient temperature: 37
pCO2 arterial: 35.8 mmHg (ref 32.0–48.0)
pH, Arterial: 7.474 — ABNORMAL HIGH (ref 7.350–7.450)
pO2, Arterial: 61.3 mmHg — ABNORMAL LOW (ref 83.0–108.0)

## 2021-08-20 NOTE — Progress Notes (Signed)
Occupational Therapy Weekly Progress Note  Patient Details  Name: Scott Castro MRN: [id number] Date of Birth: [birth year]  Beginning of progress report period: August 13, 2021 End of progress report period: August 20, 2021  Today's Date: 08/20/2021 OT Individual Time: 3903-0092 OT Individual Time Calculation (min): 55 min    Patient has met 3 of 4 short term goals.  Pt is steadily progressing toward OT goals performing stand pivot transfers with Min A, standing balance with CGA during LB ADL tasks, shower level bathing with Supervision/CGA and utilizing lateral leans, and self propelling with Supervision for short distances. Pt ed and training ongoing for energy conservation and task simplification. Extensive discussion with pt and ongoing for DC planning home set up and DME needs.  Patient continues to demonstrate the following deficits: muscle weakness, decreased cardiorespiratoy endurance, decreased coordination and decreased motor planning, and decreased sitting balance, decreased standing balance, decreased postural control, and decreased balance strategies and therefore will continue to benefit from skilled OT intervention to enhance overall performance with BADL, iADL, and Reduce care partner burden.  Patient progressing toward long term goals..  Continue plan of care.  OT Short Term Goals Week 1:  OT Short Term Goal 1 (Week 1): Pt will complete stand pivot to toilet using RW with min A. OT Short Term Goal 1 - Progress (Week 1): Met OT Short Term Goal 2 (Week 1): Pt will  sit to stand to RW with S to prep for LB self care., OT Short Term Goal 2 - Progress (Week 1): Progressing toward goal OT Short Term Goal 3 (Week 1): Pt will stand with CGA as he manages clothing over hips during self care tasks. OT Short Term Goal 3 - Progress (Week 1): Met OT Short Term Goal 4 (Week 1): Pt will complete simulated tub bench transfer with CGA. OT Short Term Goal 4 - Progress (Week 1): Met Week 2:   OT Short Term Goal 1 (Week 2): STG = LTG at Mod I  Skilled Therapeutic Interventions/Progress Updates:    Pt greeted at time of session sitting up in wheelchair agreeable to OT session, no pain at rest and only minimal pain in residual limb during shower. Pt self propel to doorway of bathroom and set up with RW, stand pivot wheelchair > shower bench and simulated for home tub shower transfer, pt performing with CGA. OT covered residual limb and IV site for waterproofing. Pt performed UB/LB bathing CGA/supervision seated on bench, educated on lateral leans for donning/doffing clothes and washing buttocks with pt demonstrating. Dried off same manner seated. UB dress set up, LB dress underwear Min A only to help thread L foot but CGA to don pants. Applied limb guard seated and stand pivot bench > wheelchair CGA/Min. Note pt needs cues for anterior weight shift, pushing up from surface, and cues to fully turn prior to sitting down. Self propel to sink and performed grooming tasks Mod I. Ended session with discussion on DC planning, home set up, etc. Up in chair for group with call bell in reach and all needs met.     Therapy Documentation Precautions:  Precautions Precautions: Fall Restrictions Weight Bearing Restrictions: Yes RLE Weight Bearing: Non weight bearing Other Position/Activity Restrictions: s/p R BKA     Therapy/Group: Individual Therapy  [name] 08/20/2021, 7:12 AM

## 2021-08-20 NOTE — Progress Notes (Signed)
[facility]) Hospital Liaison note:  This is a pending outpatient-based Palliative Care patient. Will continue to follow for disposition.  Please call with any outpatient palliative questions or concerns.  Thank you, Lorelee Market, LPN [facility] Liaison [phone]

## 2021-08-20 NOTE — Progress Notes (Addendum)
RN assessed pt during Q4 vitals. Mews 3 Yellow. BP continues to run low 90/52. On call provider called to report findings. During assessment Pt noted to have a net total PO fluid intake of 320 ml. Pt states he has been sipping on water and reports having less than 400 ml intake today. RN provided 320 ml of water PO, waited 30 minutes and rechecked his vitals both manually 104/78 HR 100 BPM, and with the machine 105/76 HR 98. Vitals reported to on call Provider. Metoprolol was given at this time Per MD. RN did not give dose at 2000. Dose was rescheduled to 2300. RN will continue to monitor vitals Q4 hours. Pt is alert and oriented, Resp even and unlabored. O2 is WNL room air. Pt denies any acute distress.   0230 Q4 vitals has improved and is green in the MEWS charting. Pt is resting well without any distress. Purposeful rounding completed every hour to assess needs, and repositioning. RN will continue to assess and monitor.

## 2021-08-20 NOTE — Progress Notes (Signed)
Occupational Therapy Session Note  Patient Details  Name: Scott Castro MRN: [id number] Date of Birth: 09-Jun-1956  Today's Date: 08/20/2021 OT Group Time: 1100-1200 OT Group Time Calculation (min): 60 min   Short Term Goals: Week 2:  OT Short Term Goal 1 (Week 2): STG = LTG at Mod I  Skilled Therapeutic Interventions/Progress Updates:  Pt participated in group session with a focus on stress mgmt, education on healthy coping strategies, and social interaction. Focus of session on providing coping strategies to manage new current level of function as a result of new diagnosis.  Session focus on breaking down stressors into daily hassles, major life stressors and life circumstances in an effort to allow pts to chunk their stressors into groups. Pt actively sharing stressors and contributing to group conversation. Provided active listening, emotional support and therapeutic use of self. Offered education on factors that protect [name] against stress such as daily uplifts, healthy coping strategies and protective factors. Encouraged all group members to make an effort to actively recall one event from their day that was a daily uplift in an effort to protect their mindset from stressors as well as sharing this information with their caregivers to facilitate improved caregiver communication and decrease overall burden of care.  Issued pt handouts on healthy coping strategies to implement into routine. Pt transported back to room by RT.  Therapy Documentation Precautions:  Precautions Precautions: Fall Restrictions Weight Bearing Restrictions: Yes RLE Weight Bearing: Non weight bearing Other Position/Activity Restrictions: s/p R BKA  Pain: no pain reported during session     Therapy/Group: Group Therapy  Precious Haws 08/20/2021, 12:28 PM

## 2021-08-20 NOTE — Progress Notes (Signed)
Occupational Therapy Session Note  Patient Details  Name: GEARY RUFO MRN: [id number] Date of Birth: [birth year]  Today's Date: 08/21/2021 OT Individual Time: 1300-1358 OT Individual Time Calculation (min): 58 min   Skilled Therapeutic Interventions/Progress Updates:    Pt greeted in the w/c with pain manageable for tx. ADL needs were met, pt on 2L 02 throughout session. He self propelled to the therapy apartment on the 4th floor. Discussed TTB transfers using RW, OT first demonstrating and then pt practicing himself. He needed Mod A to boost into standing after given vcs and increased time to attempt himself. CGA for short distance hopping using device. Discussed purchasing nonslip treads for the shower floor and curtain placement to minimize water spillage. After he returned to the w/c, pt reported feeling "exhausted." Worked on increasing functional independence with leg rest and limb rest management, pt requiring Min A and vcs to adjust himself during transfers today. Transitioned to kitchen mobility w/c level, pt requiring vcs for w/c placement and locking brakes when opening doors and stooping to lower shelves. Discussed placing most needed items within easy reach and asking cousin Cecille Rubin to rearrange kitchen appropriately in prep for pts d/c. Advised waiting to cook on stovetop until first practicing with [place]. Pt concerned about [employer] delivering food and placing on porch. Advised pt to place a small table near his door with a note to place food on elevated table to increase safety. Pt then self propelled back to the room. Unable to assess pts 02 due to cold hands. Informed RN. Pt reported feeling fine. He remained sitting up, all needs within reach, chair alarm set. Tx focus placed on d/c planning, functional transfers, and w/c level IADL retraining.   Therapy Documentation Precautions:  Precautions Precautions: Fall Restrictions Weight Bearing Restrictions: Yes RLE Weight Bearing: Non  weight bearing Other Position/Activity Restrictions: s/p R BKA Vital Signs: Therapy Vitals Temp: 98.3 F (36.8 C) Pulse Rate: 72 Resp: 19 BP: 96/65 Patient Position (if appropriate): Sitting Oxygen Therapy SpO2: (!) 82 % O2 Device: Room Air ADL: ADL Eating: Independent Grooming: Setup Upper Body Bathing: Setup Where Assessed-Upper Body Bathing: Sitting at sink Lower Body Bathing: Minimal assistance Where Assessed-Lower Body Bathing: Sitting at sink Upper Body Dressing: Setup Where Assessed-Upper Body Dressing: Wheelchair Lower Body Dressing: Moderate assistance Where Assessed-Lower Body Dressing: Wheelchair Toileting: Moderate assistance Where Assessed-Toileting: Glass blower/designer: Moderate assistance Toilet Transfer Method: Squat pivot Toilet Transfer Equipment: Raised toilet seat, Grab bars   Therapy/Group: Individual Therapy  Gerber Penza A Shaunn Tackitt 08/21/2021, 3:22 PM

## 2021-08-20 NOTE — Progress Notes (Signed)
Received a call from nurse reporting Scott Castro was hypotensive, she states she had held his Metoprolol. Vitals was reviewed, his pulse was 111. She was asked to check his Vitals: Apical Pulse and Manual Blood Pressure.  Apical Pulse: 100     Blood Pressure: 105/76. She will give Scott Castro his Metoprolol, she verbalizes understanding.

## 2021-08-20 NOTE — Progress Notes (Signed)
PROGRESS NOTE   Subjective/Complaints:  Pt without new issues. Therapy is telling me he is still demonstrating low O2 sats. Pt denies any symptoms. His digits are always cool to cold. BP's running low post HD, received albumin yesterday. Pt reports reasonable pain control.  ROS: Patient denies fever, rash, sore throat, blurred vision, nausea, vomiting, diarrhea, cough, shortness of breath or chest pain,   headache, or mood change.    Objective:   No results found. Recent Labs    08/18/21 1413  WBC 7.7  HGB 7.7*  HCT 24.5*  PLT 359    Recent Labs    08/18/21 1412  NA 132*  K 4.3  CL 94*  CO2 26  GLUCOSE 97  BUN 38*  CREATININE 6.08*  CALCIUM 7.4*     Intake/Output Summary (Last 24 hours) at 08/20/2021 1123 Last data filed at 08/20/2021 0749 Gross per 24 hour  Intake 320 ml  Output --  Net 320 ml        Physical Exam: Vital Signs Blood pressure 126/83, pulse 84, temperature 97.9 F (36.6 C), temperature source Oral, resp. rate 19, height 5\' 9"  (1.753 m), weight 73.5 kg, SpO2 (!) 81 %.     Constitutional: No distress . Vital signs reviewed. HEENT: NCAT, EOMI, oral membranes moist Neck: supple Cardiovascular: RRR without murmur. No JVD    Respiratory/Chest: CTA Bilaterally without wheezes or rales. Normal effort    GI/Abdomen: BS +, non-tender, non-distended Ext: no clubbing, cyanosis, fingers are cool Psych: pleasant and cooperative  Musculoskeletal:  LLE a few muscle spasms seen- not spasticity based on no increased tone    Left lower leg: Edema present but improving.   Skin:    Incision stable in appearance  Assessment/Plan: 1. Functional deficits which require 3+ hours per day of interdisciplinary therapy in a comprehensive inpatient rehab setting. Physiatrist is providing close team supervision and 24 hour management of active medical problems listed below. Physiatrist and rehab team continue  to assess barriers to discharge/monitor patient progress toward functional and medical goals  Care Tool:  Bathing  Bathing activity did not occur: Safety/medical concerns Body parts bathed by patient: Right arm, Left arm, Abdomen, Chest, Right upper leg, Left upper leg, Face, Front perineal area, Buttocks   Body parts bathed by helper: Left lower leg Body parts n/a: Right lower leg   Bathing assist Assist Level: Minimal Assistance - Patient > 75%     Upper Body Dressing/Undressing Upper body dressing Upper body dressing/undressing activity did not occur (including orthotics): Safety/medical concerns What is the patient wearing?: Pull over shirt    Upper body assist Assist Level: Set up assist    Lower Body Dressing/Undressing Lower body dressing      What is the patient wearing?: Pants, Underwear/pull up     Lower body assist Assist for lower body dressing: Minimal Assistance - Patient > 75%     Toileting Toileting    Toileting assist Assist for toileting: Minimal Assistance - Patient > 75%     Transfers Chair/bed transfer  Transfers assist     Chair/bed transfer assist level: Moderate Assistance - Patient 50 - 74%     Locomotion Ambulation  Ambulation assist   Ambulation activity did not occur: Safety/medical concerns  Assist level: Moderate Assistance - Patient 50 - 74% Assistive device: Walker-rolling Max distance: 15   Walk 10 feet activity   Assist  Walk 10 feet activity did not occur: Safety/medical concerns  Assist level: Moderate Assistance - Patient - 50 - 74% Assistive device: Walker-rolling   Walk 50 feet activity   Assist Walk 50 feet with 2 turns activity did not occur: Safety/medical concerns         Walk 150 feet activity   Assist Walk 150 feet activity did not occur: Safety/medical concerns         Walk 10 feet on uneven surface  activity   Assist Walk 10 feet on uneven surfaces activity did not occur:  Safety/medical concerns         Wheelchair     Assist Is the patient using a wheelchair?: Yes Type of Wheelchair: Manual    Wheelchair assist level: Supervision/Verbal cueing Max wheelchair distance: 150    Wheelchair 50 feet with 2 turns activity    Assist        Assist Level: Supervision/Verbal cueing   Wheelchair 150 feet activity     Assist      Assist Level: Supervision/Verbal cueing   Blood pressure 126/83, pulse 84, temperature 97.9 F (36.6 C), temperature source Oral, resp. rate 19, height 5\' 9"  (1.753 m), weight 73.5 kg, SpO2 (!) 81 %.  Medical Problem List and Plan: 1. Functional deficits secondary to recent right below the knee amputation secondary to critical limb ischemia, peripheral arterial disease, severe right lower extremity tibial artery disease.             -patient may shower but incision must be covered.              -ELOS/Goals: 1/18,   Supervision            -Continue CIR therapies including PT, OT    2.  Antithrombotics: -DVT/anticoagulation:  Mechanical: Sequential compression devices, below knee Left lower extremity Pharmaceutical: Other (comment) apixaban             -antiplatelet therapy: aspirin  3. Pain Management:  .  1/5-changed Norco to Oxycodone 5-10 mg q4 hours prn- s  1/12- pain seems controlled. Have discussed massage, vision rx   -he's tolerating shrinker 4. Mood: LCSW to evaluate and provide emotional support             -antipsychotic agents: Not applicable 5. Neuropsych: This patient is capable of making decisions on his own behalf. 6. Skin/Wound Care: tolerating shrinker ,using limb guard  -continue shrinker, +dressing if needed 7. Fluids/Electrolytes/Nutrition: Fluid restriction, monitor intake and daily weight 8.  End-stage renal disease on chronic intermittent hemodialysis.  T/T/S -HD at end of day to avoid conflicting with therapy 9: Atrial fibrillation: Continue Lopressor, Eliquis  1/11- rate  controlled- con't regimen 10: Hyperlipidemia: Continue Lipitor 11: COPD/asthma: albuterol neb PRN  -pt with persistent hypoxia. I'm not sure this is real as he's asymptomatic and his fingers are always cool to cold.  -can try checking pulse ox on ear lobes  -1/12 will check abg today off of oxygen today to see if he's truly hypoxic 12: Renal osteodystrophy: Renvela and Sensipar as per nephrology 13: Barrett's Esophagus: Continue PPI 14: Anemia, iron deficiency/chronic disease: Aransep and Venofer per nephrology. 15: Peripheral arterial disease s/p right BKA on 12/30.  --Discussed with Dr. Delana Meyer (on call vascular surgeon for Dr. Lucky Cowboy)  regarding antiplatelet therapy>>recommends aspirin 81 mg daily --Follow-up with Dr. Lucky Cowboy as outpatient  110. Depression 1/5- started Zoloft 50 mg daily for reactive depression 1/12 pt fairly up beat currently.      LOS: 8 days A FACE TO FACE EVALUATION WAS PERFORMED  Meredith Staggers 08/20/2021, 11:23 AM

## 2021-08-20 NOTE — Progress Notes (Signed)
Physical Therapy Weekly Progress Note  Patient Details  Name: Scott Castro MRN: [id number] Date of Birth: May 20, 1956  Beginning of progress report period: August 13, 2021 End of progress report period: August 20, 2021  Today's Date: 08/20/2021 PT Individual Time: 0802-0915 PT Individual Time Calculation (min): 73 min   Patient has met 1 of 3 short term goals.  Pt continues to require min A overall for transfers, mod A short distance gait. Propels w/c with BUE with supervision, including managing w/c parts. Requires cueing for locking brakes at times.   Patient continues to demonstrate the following deficits muscle weakness, decreased cardiorespiratoy endurance, and decreased standing balance, decreased balance strategies, and difficulty maintaining precautions and therefore will continue to benefit from skilled PT intervention to increase functional independence with mobility.  Patient progressing toward long term goals..  Continue plan of care.  PT Short Term Goals Week 1:  PT Short Term Goal 1 (Week 1): Pt will transfer with supervision and LRAD PT Short Term Goal 1 - Progress (Week 1): Progressing toward goal PT Short Term Goal 2 (Week 1): Pt will navigate 6" curb step with RW PT Short Term Goal 2 - Progress (Week 1): Not met PT Short Term Goal 3 (Week 1): Pt will ambulate 20 ft with LRAD PT Short Term Goal 3 - Progress (Week 1): Met Week 2:  PT Short Term Goal 1 (Week 2): =LTGs d/t ELOS  Skilled Therapeutic Interventions/Progress Updates:    pt received in bed and agreeable to therapy. Pt reports mild (unrated) pain in his residual limb and sore throat, nsg notified. Pt doffed sock with supervision, donned clean shrinker with tot A for time. Pt maintained on 1L O2 throughout session. Min A Sit to stand and Stand pivot transfer with RW to w/c. Pt propelled w/c with BUE to 4th floor gym with assist to manage supp O2. Pt directed in squats in // bars x 10, x 6, x 8 for strength and LLE  endurance. Pt then directed in hops onto 2" step for  endurance and renewed confidence in step navigation, x 3 bouts. Pt then ambulated with hop to pattern x 20 ft. Pt returned to room and remained in chair, was left with all needs in reach and alarm active.   Therapy Documentation Precautions:  Precautions Precautions: Fall Restrictions Weight Bearing Restrictions: Yes RLE Weight Bearing: Non weight bearing Other Position/Activity Restrictions: s/p R BKA General:   Vital Signs: Therapy Vitals Temp: 98.3 F (36.8 C) Pulse Rate: 77 (pulse immed rechecked, number valid) Resp: 14 BP: 114/83 Patient Position (if appropriate): Lying Oxygen Therapy SpO2: 100 % O2 Device: Room Air Pain:   Vision/Perception     Mobility:   Locomotion :    Trunk/Postural Assessment :    Balance:   Exercises:   Other Treatments:     Therapy/Group: Individual Therapy  [name] 08/20/2021, 8:44 AM

## 2021-08-20 NOTE — Progress Notes (Signed)
°  [facility] Progress Note   66 y.o. male PMH COPD Barrett's, hyperparathyroidism, ESRD, failed renal transplant, PAF on Eliquis p/w ischemic foot ulcer -> BKA on 12/30.  Patient receives his dialysis treatments at [place] with [facility].   Dialysis at [facility] with [facility] EDW 68.4kg? EDW before the amputation was 72kg   Elisio 16  Assessment/ Plan:   ESRD - TTS at [facility] w/ [facility] - establishing new EDW - back on TTS schedule - next HD 08/20/21--> decreased UF goal for today  [name]- last Phos 1/1 was 5; will recheck Sunday and adjust binders if needed. [name] 3 tabs TIDM. [name] 60mg  qdaily. Anemia - will dose ESA on Aranesp 57mcg qTues; 10% sats on 1/1, getting venofer PAD with critical limb ischemia s/p rt BKA for gangrene PAF - rate controlled on Eliquis.  ON dilt and metop. GERD COPD  Subjective:    Had lower BP yesterday, got albumin 25% with improved BP.    BP 126/83 (BP Location: Right Arm)    Pulse 84    Temp 97.9 F (36.6 C) (Oral)    Resp 19    Ht 5\' 9"  (1.753 m)    Wt 73.5 kg    SpO2 (!) 81% Comment: RN notified   BMI 23.93 kg/m   Intake/Output Summary (Last 24 hours) at 08/20/2021 1103 Last data filed at 08/20/2021 0749 Gross per 24 hour  Intake 320 ml  Output --  Net 320 ml   Weight change: 3.1 kg  Physical Exam: GEN: NAD, A&Ox3, NCAT HEENT:  NECK: Supple, no thyromegaly LUNGS: CTA B/L no rales, rhonchi or wheezing CV: irregular irregular, crescendo murmur ABD: SNDNT +BS  EXT:R BKA, 1+ LE edema ACCESS: Lt BBT--> distal portion is aneurysmal  Imaging: No results found.  Labs: BMET Recent Labs  Lab 08/13/21 1240 08/18/21 1412  NA 129* 132*  K 5.2* 4.3  CL 91* 94*  CO2 25 26  GLUCOSE 86 97  BUN 59* 38*  CREATININE 8.35* 6.08*  CALCIUM 7.0* 7.4*  PHOS 5.2* 4.5   CBC Recent Labs  Lab 08/13/21 1240 08/18/21 1413  WBC 10.0 7.7  HGB 8.7* 7.7*  HCT 28.1* 24.5*  MCV 99.6 99.6  PLT 251 359     Medications:     apixaban  5 mg Oral BID   aspirin EC  81 mg Oral Daily   atorvastatin  10 mg Oral QHS   cinacalcet  60 mg Oral Q supper   Darbepoetin Alfa  60 mcg Subcutaneous Q Tue-1800   diltiazem  180 mg Oral Daily   gabapentin  100 mg Oral BID   hydrocortisone  25 mg Rectal BID   metoprolol tartrate  12.5 mg Oral BID   pantoprazole  40 mg Oral Daily   senna-docusate  2 tablet Oral BID   sertraline  50 mg Oral Daily   sevelamer carbonate  2,400 mg Oral TID WC      Madelon Lips, MD 08/20/2021, 11:03 AM

## 2021-08-21 MED ORDER — SODIUM CHLORIDE 0.9 % IV SOLN
125.0000 mg | INTRAVENOUS | Status: DC
  Administered 2021-08-22 – 2021-08-29 (×2): 125 mg via INTRAVENOUS
  Filled 2021-08-21 (×5): qty 10

## 2021-08-21 NOTE — Progress Notes (Addendum)
PROGRESS NOTE   Subjective/Complaints:  BP low last night, metoprolol held initially. Pt pushed fluids. O2 sats low yesterday as well. Pt tells me he has 2 oxygen takes and O2 concentrator at home  ROS: Patient denies fever, rash, sore throat, blurred vision, nausea, vomiting, diarrhea, cough, shortness of breath or chest pain, headache, or mood change.    Objective:   No results found. Recent Labs    08/18/21 1413 08/20/21 1312  WBC 7.7 9.4  HGB 7.7* 7.7*  HCT 24.5* 25.8*  PLT 359 430*    Recent Labs    08/18/21 1412 08/20/21 1312  NA 132* 132*  K 4.3 3.8  CL 94* 94*  CO2 26 26  GLUCOSE 97 115*  BUN 38* 38*  CREATININE 6.08* 6.16*  CALCIUM 7.4* 7.5*     Intake/Output Summary (Last 24 hours) at 08/21/2021 1153 Last data filed at 08/21/2021 0700 Gross per 24 hour  Intake 870 ml  Output 2400 ml  Net -1530 ml        Physical Exam: Vital Signs Blood pressure 121/80, pulse (!) 103, temperature 98 F (36.7 C), resp. rate 19, height 5\' 9"  (1.753 m), weight 73.2 kg, SpO2 (!) 81 %.     Constitutional: No distress . Vital signs reviewed. HEENT: NCAT, EOMI, oral membranes moist, O2 [place]  Neck: supple Cardiovascular: RRR without murmur. No JVD    Respiratory/Chest: CTA Bilaterally without wheezes or rales. Normal effort. Good air movement    GI/Abdomen: BS +, non-tender, non-distended Ext: no clubbing, cyanosis, or edema Psych: pleasant and cooperative  Musculoskeletal:  LLE a few muscle spasms seen- not spasticity based on no increased tone    Left lower leg: Edema present but improving.   Skin:    Stump is consistent with picture. Small drop of blood along right lateral aspect of incision. Central blister very superficial, healng  Assessment/Plan: 1. Functional deficits which require 3+ hours per day of interdisciplinary therapy in a comprehensive inpatient rehab setting. Physiatrist is providing close  team supervision and 24 hour management of active medical problems listed below. Physiatrist and rehab team continue to assess barriers to discharge/monitor patient progress toward functional and medical goals  Care Tool:  Bathing  Bathing activity did not occur: Safety/medical concerns Body parts bathed by patient: Right arm, Left arm, Abdomen, Chest, Right upper leg, Left upper leg, Face, Front perineal area, Buttocks   Body parts bathed by helper: Left lower leg Body parts n/a: Right lower leg   Bathing assist Assist Level: Minimal Assistance - Patient > 75%     Upper Body Dressing/Undressing Upper body dressing Upper body dressing/undressing activity did not occur (including orthotics): Safety/medical concerns What is the patient wearing?: Pull over shirt    Upper body assist Assist Level: Set up assist    Lower Body Dressing/Undressing Lower body dressing      What is the patient wearing?: Pants, Underwear/pull up     Lower body assist Assist for lower body dressing: Minimal Assistance - Patient > 75%     Toileting Toileting    Toileting assist Assist for toileting: Minimal Assistance - Patient > 75%     Transfers Chair/bed transfer  Transfers assist     Chair/bed transfer assist level: Minimal Assistance - Patient > 75%     Locomotion Ambulation   Ambulation assist   Ambulation activity did not occur: Safety/medical concerns  Assist level: Moderate Assistance - Patient 50 - 74% Assistive device: Walker-rolling Max distance: 20   Walk 10 feet activity   Assist  Walk 10 feet activity did not occur: Safety/medical concerns  Assist level: Moderate Assistance - Patient - 50 - 74% Assistive device: Walker-rolling   Walk 50 feet activity   Assist Walk 50 feet with 2 turns activity did not occur: Safety/medical concerns         Walk 150 feet activity   Assist Walk 150 feet activity did not occur: Safety/medical concerns         Walk  10 feet on uneven surface  activity   Assist Walk 10 feet on uneven surfaces activity did not occur: Safety/medical concerns         Wheelchair     Assist Is the patient using a wheelchair?: Yes Type of Wheelchair: Manual    Wheelchair assist level: Supervision/Verbal cueing Max wheelchair distance: 250    Wheelchair 50 feet with 2 turns activity    Assist        Assist Level: Supervision/Verbal cueing   Wheelchair 150 feet activity     Assist      Assist Level: Supervision/Verbal cueing   Blood pressure 121/80, pulse (!) 103, temperature 98 F (36.7 C), resp. rate 19, height 5\' 9"  (1.753 m), weight 73.2 kg, SpO2 (!) 81 %.  Medical Problem List and Plan: 1. Functional deficits secondary to recent right below the knee amputation secondary to critical limb ischemia, peripheral arterial disease, severe right lower extremity tibial artery disease.             -patient may shower but incision must be covered.              -ELOS/Goals: 1/18,   Supervision           -Continue CIR therapies including PT, OT    2.  Antithrombotics: -DVT/anticoagulation:  Mechanical: Sequential compression devices, below knee Left lower extremity Pharmaceutical: Other (comment) apixaban             -antiplatelet therapy: aspirin  3. Pain Management:  .  1/5-changed Norco to Oxycodone 5-10 mg q4 hours prn- s  1/13- pain seems controlled. Have discussed massage, vision rx   -he's tolerating shrinker 4. Mood: LCSW to evaluate and provide emotional support             -antipsychotic agents: Not applicable 5. Neuropsych: This patient is capable of making decisions on his own behalf. 6. Skin/Wound Care: tolerating shrinker ,using limb guard  -continue shrinker, +dressing if needed as he has occasional drainage 7. Fluids/Electrolytes/Nutrition: Fluid restriction, monitor intake and daily weight 8.  End-stage renal disease on chronic intermittent hemodialysis.  T/T/S -HD at end of  day to avoid conflicting with therapy -volume mgt per nephrology team, has had issues with hypotension a few times after HD 9: Atrial fibrillation: Continue Lopressor, Eliquis  1/11- rate controlled- con't regimen 10: Hyperlipidemia: Continue Lipitor 11: COPD/asthma: albuterol neb PRN  -1/13 pO2 61 on ABG yesterday   -will need to go home on oxygen   -apparently has some equipment per above 12: Renal osteodystrophy: Renvela and Sensipar as per nephrology 13: Barrett's Esophagus: Continue PPI 14: Anemia, iron deficiency/chronic disease: Aransep and Venofer per nephrology. 15: Peripheral arterial  disease s/p right BKA on 12/30.  --Was discussed with Dr. Delana Meyer (on call vascular surgeon for Dr. Lucky Cowboy) regarding antiplatelet therapy>>recommends aspirin 81 mg daily --Follow-up with Dr. Lucky Cowboy as outpatient  51. Depression 1/5- started Zoloft 50 mg daily for reactive depression 1/13 pt fairly up beat currently.      LOS: 9 days A FACE TO FACE EVALUATION WAS PERFORMED  Meredith Staggers 08/21/2021, 11:53 AM

## 2021-08-21 NOTE — Progress Notes (Signed)
Occupational Therapy Session Note  Patient Details  Name: Scott Castro MRN: [id number] Date of Birth: [birth year]  Today's Date: 08/21/2021 OT Individual Time: 3817-7116 OT Individual Time Calculation (min): 28 min    Short Term Goals: Week 2:  OT Short Term Goal 1 (Week 2): STG = LTG at Mod I  Skilled Therapeutic Interventions/Progress Updates:    Pt resting in w/c upon arrival. Resting HR 124. O2 97%. Pt with flat affect at beginning of session but more interactive near end of session. Discussed discharge plans. Pt concerned about ramp for home. Emotional support provided and therapeutic listening employed. HR>110. Physical activity not initiated. Pt remained seated in w/c with all needs within reach.   Therapy Documentation Precautions:  Precautions Precautions: Fall Restrictions Weight Bearing Restrictions: Yes RLE Weight Bearing: Non weight bearing Other Position/Activity Restrictions: s/p R BKA     Therapy/Group: Individual Therapy  Leroy Libman 08/21/2021, 10:03 AM

## 2021-08-21 NOTE — Progress Notes (Signed)
°  [facility] Progress Note   66 y.o. male PMH COPD Barrett's, hyperparathyroidism, ESRD, failed renal transplant, PAF on Eliquis p/w ischemic foot ulcer -> BKA on 12/30.  Patient receives his dialysis treatments at [place] with [facility].   Dialysis at [facility] with [facility] EDW 68.4kg? EDW before the amputation was 72kg   Elisio 16  Assessment/ Plan:   ESRD - TTS at [facility] w/ [facility] - establishing new EDW - back on TTS schedule - next HD 08/20/21--> decreased UF goal for today  [name]- last Phos 1/1 was 5; will recheck Sunday and adjust binders if needed. [name] 3 tabs TIDM. [name] 60mg  qdaily. Anemia - will dose ESA on Aranesp 55mcg qTues; 10% sats on 1/1--> supposed to get iron but ? If order fell off, will give PAD with critical limb ischemia s/p rt BKA for gangrene PAF - rate controlled on Eliquis.  ON dilt and metop. GERD COPD  Subjective:    Had lower BP yesterday, got albumin 25% with improved BP.    BP 96/65 (BP Location: Right Arm)    Pulse 72    Temp 98.3 F (36.8 C)    Resp 19    Ht 5\' 9"  (1.753 m)    Wt 73.2 kg    SpO2 (!) 82%    BMI 23.83 kg/m   Intake/Output Summary (Last 24 hours) at 08/21/2021 1527 Last data filed at 08/21/2021 0700 Gross per 24 hour  Intake 810 ml  Output 2400 ml  Net -1590 ml   Weight change: -0.3 kg  Physical Exam: GEN: NAD, A&Ox3, NCAT HEENT:  NECK: Supple, no thyromegaly LUNGS: CTA B/L no rales, rhonchi or wheezing CV: irregular irregular, crescendo murmur ABD: SNDNT +BS  EXT:R BKA, 1+ LE edema ACCESS: Lt BBT--> distal portion is aneurysmal  Imaging: No results found.  Labs: BMET Recent Labs  Lab 08/18/21 1412 08/20/21 1312  NA 132* 132*  K 4.3 3.8  CL 94* 94*  CO2 26 26  GLUCOSE 97 115*  BUN 38* 38*  CREATININE 6.08* 6.16*  CALCIUM 7.4* 7.5*  PHOS 4.5 3.4   CBC Recent Labs  Lab 08/18/21 1413 08/20/21 1312  WBC 7.7 9.4  HGB 7.7* 7.7*  HCT 24.5* 25.8*  MCV 99.6 99.6   PLT 359 430*    Medications:     apixaban  5 mg Oral BID   aspirin EC  81 mg Oral Daily   atorvastatin  10 mg Oral QHS   cinacalcet  60 mg Oral Q supper   Darbepoetin Alfa  60 mcg Subcutaneous Q Tue-1800   diltiazem  180 mg Oral Daily   gabapentin  100 mg Oral BID   hydrocortisone  25 mg Rectal BID   metoprolol tartrate  12.5 mg Oral BID   pantoprazole  40 mg Oral Daily   senna-docusate  2 tablet Oral BID   sertraline  50 mg Oral Daily   sevelamer carbonate  2,400 mg Oral TID WC      Madelon Lips, MD 08/21/2021, 3:27 PM

## 2021-08-21 NOTE — Progress Notes (Signed)
Physical Therapy Session Note  Patient Details  Name: Scott Castro MRN: [id number] Date of Birth: [birth year]  Today's Date: 08/21/2021 PT Individual Time: 0807-0900 PT Individual Time Calculation (min): 53 min   Short Term Goals: Week 2:  PT Short Term Goal 1 (Week 2): =LTGs d/t ELOS  Skilled Therapeutic Interventions/Progress Updates: Pt presented in bed agreeable to therapy. Pt requesting to use bathroom prior to leaving room. Performed supine to sit with supervision and use of bed features. Performed squat pivot to w/c with CGA and propelled to bathroom door where pt performed stand pivot transfer to toilet with CGA and RW. Pt was CGA for clothing management (+BM). Pt noted to have rectal bleeding while performing peri-care at supervision level. Per pt is normal as he "helps it get out sometimes". Bleeding decreased after a couple of minutes and pt was able to complete peri-care, and performed stand pivot transfer back to w/c in same manner as prior. Pt propelled w/c to sink and SpO2 checked after hand hygiene noted to be at 82% on RA. PTA set up pt with 1L supplemental O2 and resolved to 96%. Pt transported to [place] and SpO2 checked prior to ambulation. SpO2 100% but HR 138. PTA checked with dynamap and HR initially in 70s then jumped to 130s/140s, therefore ambulation deferred. Pt transported back to room and full vitals checked with all WNL (BP 104/78 MAP87 HR 101). Pt agreeable to remain in w/c with seat alarm on, call bell within reach and current needs met. Once completed PTA notified Elmyra Ricks, LPN of pt's vitals within session.        Therapy Documentation Precautions:  Precautions Precautions: Fall Restrictions Weight Bearing Restrictions: Yes RLE Weight Bearing: Non weight bearing Other Position/Activity Restrictions: s/p R BKA General:   Vital Signs: Therapy Vitals Temp: 98.3 F (36.8 C) Pulse Rate: 72 Resp: 19 BP: 96/65 Patient Position (if appropriate):  Sitting Oxygen Therapy SpO2: (!) 82 % O2 Device: Room Air Pain:   Mobility:   Locomotion :    Trunk/Postural Assessment :    Balance:   Exercises:   Other Treatments:      Therapy/Group: Individual Therapy  Andromeda Poppen 08/21/2021, 4:02 PM

## 2021-08-21 NOTE — Progress Notes (Signed)
Physical Therapy Session Note  Patient Details  Name: Scott Castro MRN: [id number] Date of Birth: 07-31-1956  Today's Date: 08/21/2021 PT Individual Time: 1031-1129 PT Individual Time Calculation (min): 58 min   Short Term Goals: Week 1:  PT Short Term Goal 1 (Week 1): Pt will transfer with supervision and LRAD PT Short Term Goal 1 - Progress (Week 1): Progressing toward goal PT Short Term Goal 2 (Week 1): Pt will navigate 6" curb step with RW PT Short Term Goal 2 - Progress (Week 1): Not met PT Short Term Goal 3 (Week 1): Pt will ambulate 20 ft with LRAD PT Short Term Goal 3 - Progress (Week 1): Met Week 2:  PT Short Term Goal 1 (Week 2): =LTGs d/t ELOS  Skilled Therapeutic Interventions/Progress Updates:    Pt seated in w/c on arrival and agreeable to therapy. Attempted to take O2/HR. HR=110 bpm on dynamap, unable to get manual reading. O2=92% and pt asymptomatic. Maintained on 1L O2 throughout. Pt propelled w/c with BUE throughout session with session. Car transfer with min A to power up and for balance. Gait x 13 ft and x 23 ft with min A and RW. Extended rest breaks requireed after activities. Pt requested UE work. Pt performed the following exercises with 3 lb to promote UE strength and endurance 2 x 20: OH press, chest press, bicep curl. Pt was transported back to room and remained in w/c, was left with all needs in reach and alarm active.   Therapy Documentation Precautions:  Precautions Precautions: Fall Restrictions Weight Bearing Restrictions: Yes RLE Weight Bearing: Non weight bearing Other Position/Activity Restrictions: s/p R BKA General:   Vital Signs: Therapy Vitals Pulse Rate:  (pt unavailable for Q4 vitals will be done post-therapy) Resp:  (pt unavailable for Q4 vitals will be done post-therapy) BP:  (pt unavailable for Q4 vitals will be done post-therapy) Oxygen Therapy SpO2:  (pt unavailable for Q4 vitals will be done post-therapy) Pain:   Mobility:    Locomotion :    Trunk/Postural Assessment :    Balance:   Exercises:   Other Treatments:      Therapy/Group: Individual Therapy  [name] 08/21/2021, 11:14 AM

## 2021-08-21 NOTE — Progress Notes (Addendum)
Patient ID: Scott Castro, male   DOB: [birth year], 66 y.o.   MRN: [id number]  Met with pt again to follow up with what he needs to be doing. His main issue is getting a ramp into his home so he can go back and forth to HD. Do not think he has made any phone calls regarding this even though gave him a list of places that build ramps/ Checking with person whose church builds ramps and the will know something on Monday. Pt seems to be overwhelmed with being here and what lies ahead for him. Have given him resources for handicapped transport to get back and [place] to HD [place]. Also private duty if he feels this is needed. Honestly do not feel the ramp will be in place by next Wed when his target discharge date is. Pt reports he is back on his oxygen and is having heart issues now. He feels staff does not tell him anything and bedside RN was in the room and asked her to answer his questions, which she was happy to do. Gave him the number to call [employer] to discuss his co-pays for equipment, which he needs to call maybe an option is to have him go to SNF until he can get all of the things in place-ie ramp, transport, etc. Will await feedback Monday to see if can get ramp built by church, then will move forward with options. Pt will need to be pushed to do what he can for himself also. Pt already has home O2 from when he had COVID and has been on it for the past two months.

## 2021-08-22 NOTE — Progress Notes (Signed)
°  [facility] Progress Note   66 y.o. male PMH COPD Barrett's, hyperparathyroidism, ESRD, failed renal transplant, PAF on Eliquis p/w ischemic foot ulcer -> BKA on 12/30.  Patient receives his dialysis treatments at [place] with [facility].   Dialysis at [facility] with [facility] EDW 68.4kg? EDW before the amputation was 72kg   Elisio 16  Assessment/ Plan:   ESRD - TTS at [facility] w/ [facility] - establishing new EDW - back on TTS schedule - next HD today 08/22/21  [name]- last Phos 1/1 was 5; will recheck Sunday and adjust binders if needed. [name] 3 tabs TIDM. [name] 60mg  qdaily. Anemia - will dose ESA on Aranesp 9mcg qTues; 10% sats on 1/1--> supposed to get iron but ? If order fell off, will give.  Hbg 7.7 PAD with critical limb ischemia s/p rt BKA for gangrene PAF - rate controlled on Eliquis.  ON dilt and metop. GERD COPD  Subjective:    No issues overnight.  Working with PT  BP (!) 87/59 (BP Location: Right Arm)    Pulse 88    Temp 97.8 F (36.6 C)    Resp 18    Ht 5\' 9"  (1.753 m)    Wt 73.2 kg    SpO2 100%    BMI 23.83 kg/m   Intake/Output Summary (Last 24 hours) at 08/22/2021 1154 Last data filed at 08/22/2021 0847 Gross per 24 hour  Intake 880 ml  Output --  Net 880 ml   Weight change: 0 kg  Physical Exam: GEN: NAD, A&Ox3, NCAT HEENT:  NECK: Supple, no thyromegaly LUNGS: CTA B/L no rales, rhonchi or wheezing CV: irregular irregular, crescendo murmur ABD: SNDNT +BS  EXT:R BKA, 1+ LE edema ACCESS: Lt BBT--> distal portion is aneurysmal  Imaging: No results found.  Labs: BMET Recent Labs  Lab 08/18/21 1412 08/20/21 1312  NA 132* 132*  K 4.3 3.8  CL 94* 94*  CO2 26 26  GLUCOSE 97 115*  BUN 38* 38*  CREATININE 6.08* 6.16*  CALCIUM 7.4* 7.5*  PHOS 4.5 3.4   CBC Recent Labs  Lab 08/18/21 1413 08/20/21 1312  WBC 7.7 9.4  HGB 7.7* 7.7*  HCT 24.5* 25.8*  MCV 99.6 99.6  PLT 359 430*    Medications:      apixaban  5 mg Oral BID   aspirin EC  81 mg Oral Daily   atorvastatin  10 mg Oral QHS   cinacalcet  60 mg Oral Q supper   Darbepoetin Alfa  60 mcg Subcutaneous Q Tue-1800   diltiazem  180 mg Oral Daily   gabapentin  100 mg Oral BID   hydrocortisone  25 mg Rectal BID   metoprolol tartrate  12.5 mg Oral BID   pantoprazole  40 mg Oral Daily   senna-docusate  2 tablet Oral BID   sertraline  50 mg Oral Daily   sevelamer carbonate  2,400 mg Oral TID WC      Madelon Lips, MD 08/22/2021, 11:54 AM

## 2021-08-22 NOTE — Progress Notes (Signed)
PROGRESS NOTE   Subjective/Complaints:  Pt without therapy today , enjoying the rest , has HD later  R BKA pain subsiding   ROS: Patient denies CP, SOB, N/V/D  Objective:   No results found. Recent Labs    08/20/21 1312  WBC 9.4  HGB 7.7*  HCT 25.8*  PLT 430*     Recent Labs    08/20/21 1312  NA 132*  K 3.8  CL 94*  CO2 26  GLUCOSE 115*  BUN 38*  CREATININE 6.16*  CALCIUM 7.5*      Intake/Output Summary (Last 24 hours) at 08/22/2021 0726 Last data filed at 08/22/2021 7972 Gross per 24 hour  Intake 540 ml  Output --  Net 540 ml         Physical Exam: Vital Signs Blood pressure 97/65, pulse 97, temperature 98.3 F (36.8 C), temperature source Oral, resp. rate 18, height 5\' 9"  (1.753 m), weight 73.2 kg, SpO2 99 %.     Constitutional: No distress . Vital signs reviewed. HEENT: NCAT, EOMI, oral membranes moist, O2 [place]  Neck: supple Cardiovascular: RRR without murmur. No JVD    Respiratory/Chest: CTA Bilaterally without wheezes or rales. Normal effort. Good air movement    GI/Abdomen: BS +, non-tender, non-distended Ext: no clubbing, cyanosis, or edema Psych: pleasant and cooperative  Musculoskeletal:  LLE a few muscle spasms seen- not spasticity based on no increased tone    Left lower leg: Edema present but improving.   Skin:      08/22/21 image    Assessment/Plan: 1. Functional deficits which require 3+ hours per day of interdisciplinary therapy in a comprehensive inpatient rehab setting. Physiatrist is providing close team supervision and 24 hour management of active medical problems listed below. Physiatrist and rehab team continue to assess barriers to discharge/monitor patient progress toward functional and medical goals  Care Tool:  Bathing  Bathing activity did not occur: Safety/medical concerns Body parts bathed by patient: Right arm, Left arm, Abdomen, Chest, Right upper leg,  Left upper leg, Face, Front perineal area, Buttocks   Body parts bathed by helper: Left lower leg Body parts n/a: Right lower leg   Bathing assist Assist Level: Minimal Assistance - Patient > 75%     Upper Body Dressing/Undressing Upper body dressing Upper body dressing/undressing activity did not occur (including orthotics): Safety/medical concerns What is the patient wearing?: Pull over shirt    Upper body assist Assist Level: Set up assist    Lower Body Dressing/Undressing Lower body dressing      What is the patient wearing?: Pants, Underwear/pull up     Lower body assist Assist for lower body dressing: Minimal Assistance - Patient > 75%     Toileting Toileting    Toileting assist Assist for toileting: Minimal Assistance - Patient > 75%     Transfers Chair/bed transfer  Transfers assist     Chair/bed transfer assist level: Minimal Assistance - Patient > 75%     Locomotion Ambulation   Ambulation assist   Ambulation activity did not occur: Safety/medical concerns  Assist level: Moderate Assistance - Patient 50 - 74% Assistive device: Walker-rolling Max distance: 20   Walk 10 feet  activity   Assist  Walk 10 feet activity did not occur: Safety/medical concerns  Assist level: Moderate Assistance - Patient - 50 - 74% Assistive device: Walker-rolling   Walk 50 feet activity   Assist Walk 50 feet with 2 turns activity did not occur: Safety/medical concerns         Walk 150 feet activity   Assist Walk 150 feet activity did not occur: Safety/medical concerns         Walk 10 feet on uneven surface  activity   Assist Walk 10 feet on uneven surfaces activity did not occur: Safety/medical concerns         Wheelchair     Assist Is the patient using a wheelchair?: Yes Type of Wheelchair: Manual    Wheelchair assist level: Supervision/Verbal cueing Max wheelchair distance: 250    Wheelchair 50 feet with 2 turns  activity    Assist        Assist Level: Supervision/Verbal cueing   Wheelchair 150 feet activity     Assist      Assist Level: Supervision/Verbal cueing   Blood pressure 97/65, pulse 97, temperature 98.3 F (36.8 C), temperature source Oral, resp. rate 18, height 5\' 9"  (1.753 m), weight 73.2 kg, SpO2 99 %.  Medical Problem List and Plan: 1. Functional deficits secondary to recent right below the knee amputation secondary to critical limb ischemia, peripheral arterial disease, severe right lower extremity tibial artery disease.             -patient may shower but incision must be covered.              -ELOS/Goals: 1/18,   Supervision           -Continue CIR therapies including PT, OT    2.  Antithrombotics: -DVT/anticoagulation:  Mechanical: Sequential compression devices, below knee Left lower extremity Pharmaceutical: Other (comment) apixaban             -antiplatelet therapy: aspirin  3. Pain Management:  .  1/5-changed Norco to Oxycodone 5-10 mg q4 hours prn- s  1/13- pain seems controlled. Have discussed massage, vision rx   -he's tolerating shrinker 4. Mood: LCSW to evaluate and provide emotional support             -antipsychotic agents: Not applicable 5. Neuropsych: This patient is capable of making decisions on his own behalf. 6. Skin/Wound Care: tolerating shrinker ,using limb guard  -continue shrinker, +dressing if needed as he has occasional drainage No drainage, + edema, ecchymosis unchanged  7. Fluids/Electrolytes/Nutrition: Fluid restriction, monitor intake and daily weight 8.  End-stage renal disease on chronic intermittent hemodialysis.  T/T/S -HD at end of day to avoid conflicting with therapy -volume mgt per nephrology team, has had issues with hypotension a few times after HD 9: Atrial fibrillation: Continue Lopressor, Eliquis  1/11- rate controlled- con't regimen 10: Hyperlipidemia: Continue Lipitor 11: COPD/asthma: albuterol neb PRN  -1/13 pO2  61 on ABG yesterday   -will need to go home on oxygen   -apparently has some equipment per above 12: Renal osteodystrophy: Renvela and Sensipar as per nephrology 13: Barrett's Esophagus: Continue PPI 14: Anemia, iron deficiency/chronic disease: Aransep and Venofer per nephrology. 15: Peripheral arterial disease s/p right BKA on 12/30.  --Was discussed with Dr. Delana Meyer (on call vascular surgeon for Dr. Lucky Cowboy) regarding antiplatelet therapy>>recommends aspirin 81 mg daily --Follow-up with Dr. Lucky Cowboy as outpatient  74. Depression 1/5- started Zoloft 50 mg daily for reactive depression 1/13 pt fairly up  beat currently.      LOS: 10 days A FACE TO FACE EVALUATION WAS PERFORMED  Scott Castro 08/22/2021, 7:26 AM

## 2021-08-23 NOTE — Progress Notes (Signed)
°  [facility] Progress Note   66 y.o. male PMH COPD Barrett's, hyperparathyroidism, ESRD, failed renal transplant, PAF on Eliquis p/w ischemic foot ulcer -> BKA on 12/30.  Patient receives his dialysis treatments at [place] with [facility].   Dialysis at [facility] with [facility] EDW 68.4kg? EDW before the amputation was 72kg   Elisio 16  Assessment/ Plan:   ESRD - TTS at [facility] w/ [facility] - establishing new EDW - back on TTS schedule - next HD 08/25/21  [name]- last Phos 1/1 was 5; will recheck Sunday and adjust binders if needed. [name] 3 tabs TIDM. [name] 60mg  qdaily. Anemia - will dose ESA on Aranesp 16mcg qTues; 10% sats on 1/1--> supposed to get iron but ? If order fell off, will give.  Hbg 7.7 PAD with critical limb ischemia s/p rt BKA for gangrene PAF - rate controlled on Eliquis.  ON dilt and metop. GERD COPD Dispo: planned d/c 1/18 per notes  Subjective:    1 L off with HD yesterday.  Having a bad day today- frustrated and upset about the loss of his leg.    BP 103/76 (BP Location: Right Arm)    Pulse 63    Temp 98.5 F (36.9 C)    Resp 16    Ht 5\' 9"  (1.753 m)    Wt 73.8 kg    SpO2 98%    BMI 24.03 kg/m   Intake/Output Summary (Last 24 hours) at 08/23/2021 1211 Last data filed at 08/23/2021 0825 Gross per 24 hour  Intake 756 ml  Output 1000 ml  Net -244 ml   Weight change: 2 kg  Physical Exam: GEN: NAD, A&Ox3, NCAT HEENT:  NECK: Supple, no thyromegaly LUNGS: CTA B/L no rales, rhonchi or wheezing CV: irregular irregular, crescendo murmur ABD: SNDNT +BS  EXT:R BKA, 1+ LE edema, improved ACCESS: Lt BBT--> distal portion is aneurysmal  Imaging: No results found.  Labs: BMET Recent Labs  Lab 08/18/21 1412 08/20/21 1312  NA 132* 132*  K 4.3 3.8  CL 94* 94*  CO2 26 26  GLUCOSE 97 115*  BUN 38* 38*  CREATININE 6.08* 6.16*  CALCIUM 7.4* 7.5*  PHOS 4.5 3.4   CBC Recent Labs  Lab 08/18/21 1413 08/20/21 1312   WBC 7.7 9.4  HGB 7.7* 7.7*  HCT 24.5* 25.8*  MCV 99.6 99.6  PLT 359 430*    Medications:     apixaban  5 mg Oral BID   aspirin EC  81 mg Oral Daily   atorvastatin  10 mg Oral QHS   cinacalcet  60 mg Oral Q supper   Darbepoetin Alfa  60 mcg Subcutaneous Q Tue-1800   diltiazem  180 mg Oral Daily   gabapentin  100 mg Oral BID   hydrocortisone  25 mg Rectal BID   metoprolol tartrate  12.5 mg Oral BID   pantoprazole  40 mg Oral Daily   senna-docusate  2 tablet Oral BID   sertraline  50 mg Oral Daily   sevelamer carbonate  2,400 mg Oral TID WC      Madelon Lips, MD 08/23/2021, 12:11 PM

## 2021-08-23 NOTE — Progress Notes (Signed)
Occupational Therapy Session Note  Patient Details  Name: MONTARIO ZILKA MRN: [id number] Date of Birth: 1956-02-10  Today's Date: 08/23/2021 OT Individual Time: 1300-1417 OT Individual Time Calculation (min): 77 min  Skilled Therapeutic Interventions/Progress Updates:    Pt greeted in the w/c with pain manageable for tx. He wanted to shower. Oral care first completed at the sink with setup assistance. CGA for lateral scoot<TTB. OT wrapped pts IV and also residual limb. Educated pt on limb wrapping technique and he assisted with double wrapping using trash bags. Pt then bathed at seated level using leans for perihygiene. He would benefit from [facility] sponge, unable to reach his Lt foot to wash but did not want assistance from therapist. Afterwards pt completed another lateral scoot transfer<w/c. Dressing completed w/c level, pt using small push ups so OT could assist with elevating LB garments over hips. Pt able to don Lt gripper sock. He remained sitting up at close of session, all needs within reach. Tx focus placed on functional transfers, adaptive self care skills, dynamic balance, and activity tolerance.   Pt on 2L 02 throughout tx, door ajar of bathroom during bathing today for ventilation purposes.   Therapy Documentation Precautions:  Precautions Precautions: Fall Restrictions Weight Bearing Restrictions: Yes RLE Weight Bearing: Non weight bearing Other Position/Activity Restrictions: s/p R BKA Vital Signs: Therapy Vitals Temp: 97.6 F (36.4 C) Temp Source: Oral Pulse Rate: 72 Resp: 18 BP: 107/78 Patient Position (if appropriate): Sitting Oxygen Therapy SpO2: 93 % O2 Device: Nasal Cannula O2 Flow Rate (L/min): 2 L/min ADL: ADL Eating: Independent Grooming: Setup Upper Body Bathing: Setup Where Assessed-Upper Body Bathing: Sitting at sink Lower Body Bathing: Minimal assistance Where Assessed-Lower Body Bathing: Sitting at sink Upper Body Dressing: Setup Where Assessed-Upper  Body Dressing: Wheelchair Lower Body Dressing: Moderate assistance Where Assessed-Lower Body Dressing: Wheelchair Toileting: Moderate assistance Where Assessed-Toileting: Glass blower/designer: Moderate assistance Toilet Transfer Method: Squat pivot Toilet Transfer Equipment: Raised toilet seat, Grab bars   Therapy/Group: Individual Therapy  Alonah Lineback A Galdino Hinchman 08/23/2021, 4:16 PM

## 2021-08-23 NOTE — Progress Notes (Signed)
Physical Therapy Session Note  Patient Details  Name: Scott Castro MRN: [id number] Date of Birth: 08/28/1955  Today's Date: 08/23/2021 PT Individual Time: 0900-0955 PT Individual Time Calculation (min): 55 min   Short Term Goals: Week 1:  PT Short Term Goal 1 (Week 1): Pt will transfer with supervision and LRAD PT Short Term Goal 1 - Progress (Week 1): Progressing toward goal PT Short Term Goal 2 (Week 1): Pt will navigate 6" curb step with RW PT Short Term Goal 2 - Progress (Week 1): Not met PT Short Term Goal 3 (Week 1): Pt will ambulate 20 ft with LRAD PT Short Term Goal 3 - Progress (Week 1): Met  Skilled Therapeutic Interventions/Progress Updates:  Chart reviewed and pt agreeable to therapy. Pt received semi-reclined in bed on 2L O2 via [place] and 2/10 c/o pain at surgical site. Session focused on progression of ambulation with RW to promote independent mobility.Pt initiated session donning guard. Pt then completed lateral scoot transfer from EOB to [facility] using supervision. Pt SpO2 assessed as 95%. Pt propelled WC under supervision for >369f to therapy gym. In gym, pt practiced sit to stand with ModA + RW. Pt noted to be shifting weight towards affected side with transfer, and was thus educated on switching sides for transfer to promote balance. Pt attempted sit to stand with weight shift away from affected side with MinA + RW. Pt then amb 19fwith MinA + RW. Pt reported fatigue and returned to chair with [place] active recovery, pt noted to have SpO2 80% with good waveform, thus O2 titrated to 3L for continued activity. Pt again ambulated 2558fith MinA + RW. Pt remained mindful to shift weight away from affective side, and displayed less unsteadiness during sit to stand transfer. Pt then noting potential need for toileting, so pt returned to room with self-propulsion of WC >300f80fder supervision. In room, pt completed sit to stand again with minA + RW and weight shift awy from affected  side. At end of session, pt SpO2 95% on 2L O2 via [place]. Pt was left [facility], nurse call bell and all needs in reach.   Therapy Documentation Precautions:  Precautions Precautions: Fall Restrictions Weight Bearing Restrictions: Yes RLE Weight Bearing: Non weight bearing Other Position/Activity Restrictions: s/p R BKA  Vital Signs: Therapy Vitals Temp: 98.5 F (36.9 C) Pulse Rate: 63 Resp: 16 BP: 103/76 Patient Position (if appropriate): Sitting Oxygen Therapy SpO2: 98 % O2 Device: Nasal Cannula O2 Flow Rate (L/min): 2 L/min   Therapy/Group: Individual Therapy  KiraMarquette Saa, DPT 08/23/2021, 10:42 AM

## 2021-08-24 MED ORDER — SEVELAMER CARBONATE 2.4 G PO PACK
2.4000 g | PACK | Freq: Three times a day (TID) | ORAL | Status: DC
  Administered 2021-08-24 – 2021-08-30 (×17): 2.4 g via ORAL
  Filled 2021-08-24 (×22): qty 1

## 2021-08-24 NOTE — Progress Notes (Addendum)
Patient ID: Scott Castro, male   DOB: [birth year], 66 y.o.   MRN: [id number] Met with pt to discuss [employer] is willing to install the ramp at his home. No sure of time line for this. Have left message with [employer] B[employer] to try to schedule for them to look at his home. Awaiting return call. Pt has called [employer] and paid his co-pays for his equipment. Do not feel the ramp will be in place by this week therefore feel pt will need to be extended and also not sur ept has reached his mod/I level goals. Will discuss in team conference tomorrow. Encouraged him to call the private handicapped [facility] service to get him back and forth to OP-HD when home. He will do this sometime today. His cousin did get to his house over the weekend and took pictures of the lay out of his house. Will await return call from [place] and continue to work toward discharge.  1:25 PM Spoke with [employer] regarding ramp and she will touch base with pt this afternoon. Have let pot know she will be calling him. She reports can not be done until early next week. Will await her call to pt and work on discharge needs. Cousin may come and stay with him the first week but will need to now discharge date. Will discuss in team conference tomorrow.

## 2021-08-24 NOTE — Progress Notes (Signed)
°  [facility] Progress Note   66 y.o. male PMH COPD Barrett's, hyperparathyroidism, ESRD, failed renal transplant, PAF on Eliquis p/w ischemic foot ulcer -> BKA on 12/30.  Patient receives his dialysis treatments at [place] with [facility].   Dialysis at [facility] with [facility] EDW 68.4kg? EDW before the amputation was 72kg   Elisio 16  Assessment/ Plan:   ESRD - TTS at [facility] w/ [facility] - establishing new EDW - back on TTS schedule - next HD 08/25/21  [name]- last Phos 1/1 was 5; 1/16 phos 3.4 [name] 3 tabs TIDM. [name] 60mg  qdaily. Anemia - will dose ESA on Aranesp 58mcg qTues; 10% sats on 1/1--> getting IV iron load with HD. Hbg 7.7. Transfuse PRN.  PAD with critical limb ischemia s/p rt BKA for gangrene PAF - rate controlled on Eliquis.  ON dilt and metop. GERD COPD Dispo: planned d/c 1/18 per notes - issue with transportation to HD, lives in [place] in [place].  Does not wish to change HD units.  SW working on it.  Ultimately plans to drive again but tells me this will be 6+ mo from now.   Subjective:    Doing fine today.  Making d/c arrangements   BP (!) 85/71 (BP Location: Right Arm)    Pulse 92    Temp 97.7 F (36.5 C) (Oral)    Resp 14    Ht 5\' 9"  (1.753 m)    Wt 74.4 kg    SpO2 100%    BMI 24.22 kg/m   Intake/Output Summary (Last 24 hours) at 08/24/2021 1041 Last data filed at 08/24/2021 0830 Gross per 24 hour  Intake 653 ml  Output --  Net 653 ml    Weight change: -0.8 kg  Physical Exam: GEN: NAD, A&Ox3, NCAT HEENT:  NECK: Supple, no thyromegaly LUNGS: CTA B/L no rales, rhonchi or wheezing CV: irregular irregular, crescendo murmur ABD: SNDNT +BS  EXT:R BKA, 1+ LE edema, improved ACCESS: Lt BBT--> distal portion is aneurysmal  Imaging: No results found.  Labs: BMET Recent Labs  Lab 08/18/21 1412 08/20/21 1312  NA 132* 132*  K 4.3 3.8  CL 94* 94*  CO2 26 26  GLUCOSE 97 115*  BUN 38* 38*   CREATININE 6.08* 6.16*  CALCIUM 7.4* 7.5*  PHOS 4.5 3.4    CBC Recent Labs  Lab 08/18/21 1413 08/20/21 1312  WBC 7.7 9.4  HGB 7.7* 7.7*  HCT 24.5* 25.8*  MCV 99.6 99.6  PLT 359 430*     Medications:     apixaban  5 mg Oral BID   aspirin EC  81 mg Oral Daily   atorvastatin  10 mg Oral QHS   cinacalcet  60 mg Oral Q supper   Darbepoetin Alfa  60 mcg Subcutaneous Q Tue-1800   diltiazem  180 mg Oral Daily   gabapentin  100 mg Oral BID   hydrocortisone  25 mg Rectal BID   metoprolol tartrate  12.5 mg Oral BID   pantoprazole  40 mg Oral Daily   senna-docusate  2 tablet Oral BID   sertraline  50 mg Oral Daily   sevelamer carbonate  2.4 g Oral TID WC      Madelon Lips, MD 08/24/2021, 10:41 AM

## 2021-08-24 NOTE — Progress Notes (Signed)
PROGRESS NOTE   Subjective/Complaints:  Pt reports no issues Been having low BP- 90s-100s, however denies any lighthheadedness or dizziness.  Waiting to have BM last 12 hours or so- but said not quite ready to go yet- LBM Saturday.   Asking to change Renvella to powder-  will check with pharmacy.   ROS:  Pt denies SOB, abd pain, CP, N/V/C/D, and vision changes   Objective:   No results found. No results for input(s): WBC, HGB, HCT, PLT in the last 72 hours.  No results for input(s): NA, K, CL, CO2, GLUCOSE, BUN, CREATININE, CALCIUM in the last 72 hours.   Intake/Output Summary (Last 24 hours) at 08/24/2021 1342 Last data filed at 08/24/2021 0830 Gross per 24 hour  Intake 476 ml  Output --  Net 476 ml        Physical Exam: Vital Signs Blood pressure (!) 84/56, pulse 86, temperature 98.2 F (36.8 C), temperature source Oral, resp. rate 14, height 5\' 9"  (1.753 m), weight 74.4 kg, SpO2 (!) 60 %.    When saw pt this AM, appeared comfortable and no SOB- wearing 2L O2 by [place] which is home O2.  General: awake, alert, appropriate, sitting up in bed; initially asleep; NAD HENT: conjugate gaze; oropharynx moist CV: regular rate; no JVD Pulmonary: CTA B/L; no W/R/R- good air movement GI: soft, NT, ND, (+)BS Psychiatric: appropriate Neurological: Ox3  Ext: no clubbing, cyanosis- LLE BKA. Psych: pleasant and cooperative  Musculoskeletal:  LLE a few muscle spasms seen- not spasticity based on no increased tone    Left lower leg: Edema present but improving.   Skin:      08/22/21 image    Assessment/Plan: 1. Functional deficits which require 3+ hours per day of interdisciplinary therapy in a comprehensive inpatient rehab setting. Physiatrist is providing close team supervision and 24 hour management of active medical problems listed below. Physiatrist and rehab team continue to assess barriers to discharge/monitor  patient progress toward functional and medical goals  Care Tool:  Bathing  Bathing activity did not occur: Safety/medical concerns Body parts bathed by patient: Right arm, Left arm, Abdomen, Chest, Right upper leg, Left upper leg, Face, Front perineal area, Buttocks   Body parts bathed by helper: Left lower leg Body parts n/a: Right lower leg   Bathing assist Assist Level: Minimal Assistance - Patient > 75%     Upper Body Dressing/Undressing Upper body dressing Upper body dressing/undressing activity did not occur (including orthotics): Safety/medical concerns What is the patient wearing?: Pull over shirt    Upper body assist Assist Level: Set up assist    Lower Body Dressing/Undressing Lower body dressing      What is the patient wearing?: Pants, Underwear/pull up     Lower body assist Assist for lower body dressing: Minimal Assistance - Patient > 75%     Toileting Toileting    Toileting assist Assist for toileting: Contact Guard/Touching assist     Transfers Chair/bed transfer  Transfers assist     Chair/bed transfer assist level: Minimal Assistance - Patient > 75%     Locomotion Ambulation   Ambulation assist   Ambulation activity did not occur: Safety/medical concerns  Assist level: Moderate Assistance - Patient 50 - 74% Assistive device: Walker-rolling Max distance: 20   Walk 10 feet activity   Assist  Walk 10 feet activity did not occur: Safety/medical concerns  Assist level: Moderate Assistance - Patient - 50 - 74% Assistive device: Walker-rolling   Walk 50 feet activity   Assist Walk 50 feet with 2 turns activity did not occur: Safety/medical concerns         Walk 150 feet activity   Assist Walk 150 feet activity did not occur: Safety/medical concerns         Walk 10 feet on uneven surface  activity   Assist Walk 10 feet on uneven surfaces activity did not occur: Safety/medical concerns          Wheelchair     Assist Is the patient using a wheelchair?: Yes Type of Wheelchair: Manual    Wheelchair assist level: Supervision/Verbal cueing Max wheelchair distance: 250    Wheelchair 50 feet with 2 turns activity    Assist        Assist Level: Supervision/Verbal cueing   Wheelchair 150 feet activity     Assist      Assist Level: Supervision/Verbal cueing   Blood pressure (!) 84/56, pulse 86, temperature 98.2 F (36.8 C), temperature source Oral, resp. rate 14, height 5\' 9"  (1.753 m), weight 74.4 kg, SpO2 (!) 60 %.  Medical Problem List and Plan: 1. Functional deficits secondary to recent right below the knee amputation secondary to critical limb ischemia, peripheral arterial disease, severe right lower extremity tibial artery disease.             -patient may shower but incision must be covered.              -ELOS/Goals: 1/18,   Supervision          con't CIR- PT and OT  2.  Antithrombotics: -DVT/anticoagulation:  Mechanical: Sequential compression devices, below knee Left lower extremity Pharmaceutical: Other (comment) apixaban             -antiplatelet therapy: aspirin  3. Pain Management:  .  1/5-changed Norco to Oxycodone 5-10 mg q4 hours prn- s  1/13- pain seems controlled. Have discussed massage, vision rx   -he's tolerating shrinker 1/16- pain controlled- con't regimen 4. Mood: LCSW to evaluate and provide emotional support             -antipsychotic agents: Not applicable 5. Neuropsych: This patient is capable of making decisions on his own behalf. 6. Skin/Wound Care: tolerating shrinker ,using limb guard  -continue shrinker, +dressing if needed as he has occasional drainage No drainage, + edema, ecchymosis unchanged   1/16- leg healing very slowly- con't reigmen 7. Fluids/Electrolytes/Nutrition: Fluid restriction, monitor intake and daily weight 8.  End-stage renal disease on chronic intermittent hemodialysis.  T/T/S -HD at end of day to  avoid conflicting with therapy -volume mgt per nephrology team, has had issues with hypotension a few times after HD 9: Atrial fibrillation: Continue Lopressor, Eliquis  1/11- rate controlled- con't regimen 10: Hyperlipidemia: Continue Lipitor 11: COPD/asthma: albuterol neb PRN  -1/13 pO2 61 on ABG yesterday   -will need to go home on oxygen   -apparently has some equipment per above  1/16- Is on Home O2- will need to go home on Home O2- concerned about documentation of O2 sats of 60% this afternoon- on RA_ asked nursing to go eval pt and make sure if it's correct or not- and call me back  if any issues 12: Renal osteodystrophy: Renvela and Sensipar as per nephrology 13: Barrett's Esophagus: Continue PPI 14: Anemia, iron deficiency/chronic disease: Aransep and Venofer per nephrology. 15: Peripheral arterial disease s/p right BKA on 12/30.  --Was discussed with Dr. Delana Meyer (on call vascular surgeon for Dr. Lucky Cowboy) regarding antiplatelet therapy>>recommends aspirin 81 mg daily --Follow-up with Dr. Lucky Cowboy as outpatient  17. Depression 1/5- started Zoloft 50 mg daily for reactive depression 1/13 pt fairly up beat currently.    I spent a total of 41 minutes on total care today- d/w pharmacy about renvella powder as well as nursing and then claled nursing about concern for Sats of 60% documented in chart-     LOS: 12 days A FACE TO FACE EVALUATION WAS PERFORMED  Scott Castro 08/24/2021, 1:42 PM

## 2021-08-24 NOTE — Progress Notes (Signed)
Physical Therapy Session Note  Patient Details  Name: Scott Castro MRN: [id number] Date of Birth: Oct 23, 1955  Today's Date: 08/24/2021 PT Individual Time: 0076-2263 PT Individual Time Calculation (min): 73 min   Short Term Goals: Week 1:  PT Short Term Goal 1 (Week 1): Pt will transfer with supervision and LRAD PT Short Term Goal 1 - Progress (Week 1): Progressing toward goal PT Short Term Goal 2 (Week 1): Pt will navigate 6" curb step with RW PT Short Term Goal 2 - Progress (Week 1): Not met PT Short Term Goal 3 (Week 1): Pt will ambulate 20 ft with LRAD PT Short Term Goal 3 - Progress (Week 1): Met Week 2:  PT Short Term Goal 1 (Week 2): =LTGs d/t ELOS  Skilled Therapeutic Interventions/Progress Updates:    Pt seated in w/c on arrival and agreeable to therapy. Pt on room air throughout session. Pt reported pain well controlled on medication this session, positioning as needed. Pt propelled w/c with BUE to/from gym and throughout session. Pt directed in managing w/c parts throughout session, min VC for sequencing. Stand pivot transfer to mat table with CGA. Pt then directed in 4 x 6 Sit to stand to RW, occ cueing for technique, but pt largely able to self correct. Rest breaks throughout. After first set, pt reported feeling need for O2. Provided .5L for several breaths and instructed in deep breathing to regain O2 levels. Unable to get good reading d/t poor circulation. On following sets, instructed on breathing throughout and pacing with no need for supp O2 during rest of session. Pt then ambulated x 25 ft and x 30 ft with CGA and RW. Seated rest break between bouts. Pt then returned to room and reported he has had some pain at his L heel. This therapist inspected skin and noted redness and small dark area on heel. Nursing notified and left pt with heel floating on leg rest. Pt was left with all needs in reach and alarm active.   Therapy Documentation Precautions:  Precautions Precautions:  Fall Restrictions Weight Bearing Restrictions: Yes RLE Weight Bearing: Non weight bearing Other Position/Activity Restrictions: s/p R BKA    Therapy/Group: Individual Therapy  [name] 08/24/2021, 12:13 PM

## 2021-08-24 NOTE — Discharge Instructions (Addendum)
Inpatient Rehab Discharge Instructions  Scott Castro Discharge date and time:   Activities/Precautions/ Functional Status: Activity: no lifting, driving, or strenuous exercise until clear by MD Diet: renal diet Wound Care: keep wound clean and dry Functional status:  ___ No restrictions     ___ Walk up steps independently ___ 24/7 supervision/assistance   ___ Walk up steps with assistance ___ Intermittent supervision/assistance  ___ Bathe/dress independently ___ Walk with walker     __x_ Bathe/dress with assistance ___ Walk Independently    ___ Shower independently ___ Walk with assistance    ___ Shower with assistance _x__ No alcohol     ___ Return to work/school ________  Special Instructions:  No driving, alcohol consumption or tobacco use.   Resume home oxygen supplementation.  My questions have been answered and I understand these instructions. I will adhere to these goals and the provided educational materials after my discharge from the hospital.  Patient/Caregiver Signature _______________________________ Date __________  Clinician Signature _______________________________________ Date __________  Please bring this form and your medication list with you to all your follow-up doctor's appointments.    Information on my medicine - ELIQUIS (apixaban)  This medication education was reviewed with me or my healthcare representative as part of my discharge preparation.  The pharmacist that spoke with me during my hospital stay was:    Why was Eliquis prescribed for you? Eliquis was prescribed for you to reduce the risk of a blood clot forming that can cause a stroke if you have a medical condition called atrial fibrillation (a type of irregular heartbeat).  What do You need to know about Eliquis ? Take your Eliquis TWICE DAILY - one tablet in the morning and one tablet in the evening with or without food. If you have difficulty swallowing the tablet whole please discuss  with your pharmacist how to take the medication safely.  Take Eliquis exactly as prescribed by your doctor and DO NOT stop taking Eliquis without talking to the doctor who prescribed the medication.  Stopping may increase your risk of developing a stroke.  Refill your prescription before you run out.  After discharge, you should have regular check-up appointments with your healthcare provider that is prescribing your Eliquis.  In the future your dose may need to be changed if your kidney function or weight changes by a significant amount or as you get older.  What do you do if you miss a dose? If you miss a dose, take it as soon as you remember on the same day and resume taking twice daily.  Do not take more than one dose of ELIQUIS at the same time to make up a missed dose.  Important Safety Information A possible side effect of Eliquis is bleeding. You should call your healthcare provider right away if you experience any of the following: Bleeding from an injury or your nose that does not stop. Unusual colored urine (red or dark brown) or unusual colored stools (red or black). Unusual bruising for unknown reasons. A serious fall or if you hit your head (even if there is no bleeding).  Some medicines may interact with Eliquis and might increase your risk of bleeding or clotting while on Eliquis. To help avoid this, consult your healthcare provider or pharmacist prior to using any new prescription or non-prescription medications, including herbals, vitamins, non-steroidal anti-inflammatory drugs (NSAIDs) and supplements.  This website has more information on Eliquis (apixaban): http://www.eliquis.com/eliquis/home

## 2021-08-24 NOTE — Progress Notes (Signed)
Occupational Therapy Session Note  Patient Details  Name: Scott Castro MRN: [id number] Date of Birth: [birth year]  Today's Date: 08/24/2021 OT Individual Time: 1419-1530 OT Individual Time Calculation (min): 71 min    Short Term Goals: Week 2:  OT Short Term Goal 1 (Week 2): STG = LTG at Mod I  Skilled Therapeutic Interventions/Progress Updates:  Skilled OT intervention completed with focus on BUE strengthening/endurance, dynamic balance, functional transfers. Pt received supine in bed, asleep but easily woken and agreeable to session. Pt completed bed mobility with supervision, then lateral scoot to w/c with CGA. Pt able to swing leg rests around and position BLE with supervision once leg rests put on w/c. Pt self-propelled in w/c for BUE endurance <> therapy gym with supervision, then lateral scoot to EOM with supervision, with cues needed for setting up w/c close/at angle to mat prior to scooting, with pt able to doff leg rests with supervision. Issued pt an orange band (& green for when ready to progress at home), and HEP for BUE strengthening to promote BUE endurance needed for self-care and functional transfers including the following:  Horizontal shoulder abduction 2x10  Bicep flexion 2x10 each arm Alternating chest presses 2x20 Shoulder flexion 2x10 each arm Shoulder diagonal pulls 2x10 each arm Shoulder external rotation 2x10 Shoulder extension 2x10 arm  Multimodal cues required for form and technique, as well as intermittent rest breaks due to fatigue and back pain due to sitting unsupported. Pt educated on postural control required for sitting without back rest. While seated EOM, pt mass practiced sit > stand with CGA with mat elevated. Task upgraded to pt then participating in dynamic balance task during horse shoe toss while using RW and CGA for balance. Pt educated on releasing L hand during functional tasks to maximize stability on amputated side, with pt able to tolerate being  in standing for about 2 mins at a time. Pt left upright in bed, therapist doffed limb guard per pt request with total A, bed alarm on and all needs in reach at end of session.   Therapy Documentation Precautions:  Precautions Precautions: Fall Restrictions Weight Bearing Restrictions: Yes RLE Weight Bearing: Non weight bearing Other Position/Activity Restrictions: s/p R BKA  Pain: Back pain, unrated, while sitting unsupported, BUE propped on mat for seated rest breaks with improvement reported   Therapy/Group: Individual Therapy  Scott Castro Scott Castro 08/24/2021, 7:59 AM

## 2021-08-24 NOTE — Progress Notes (Signed)
Occupational Therapy Session Note  Patient Details  Name: Scott Castro MRN: [id number] Date of Birth: 10-11-1955  Today's Date: 08/24/2021 OT Individual Time: 1438-8875 OT Individual Time Calculation (min): 53 min    Short Term Goals: Week 1:  OT Short Term Goal 1 (Week 1): Pt will complete stand pivot to toilet using RW with min A. OT Short Term Goal 1 - Progress (Week 1): Met OT Short Term Goal 2 (Week 1): Pt will  sit to stand to RW with S to prep for LB self care., OT Short Term Goal 2 - Progress (Week 1): Progressing toward goal OT Short Term Goal 3 (Week 1): Pt will stand with CGA as he manages clothing over hips during self care tasks. OT Short Term Goal 3 - Progress (Week 1): Met OT Short Term Goal 4 (Week 1): Pt will complete simulated tub bench transfer with CGA. OT Short Term Goal 4 - Progress (Week 1): Met Week 2:  OT Short Term Goal 1 (Week 2): STG = LTG at Mod I   Skilled Therapeutic Interventions/Progress Updates:    Pt greeted at time of session semireclined in bed resting agreeable to OT session, no c/o pain. Pt stating he urgently needed to use the bathroom and educated on calling nursing staff when urgent. Supine > sit Mod I and donned limb guard with assist for time conservation, squat pivot bed > wheelchair Supervision with cues for hand placement. Set up at bathroom door and sit > stand at [place] before stand pivot to commode CGA. Doffed clothing with CGA before sitting on commode. Hygiene on commode with anterior weight shift and reaching behind self with set up. Simulated home environment with placing wheelchair approx 3-4 feet away from commode for pt to hop forward and turn to sit on wheelchair, performed with Min guard. Pt ed/training on wheelchair management for RLE residual limb leg rest before self propelling to sink for oral hygiene and face washing. Pt providing pictures from home and problem solve home set up and accessibility. Up in chair alarm on call bell  in reach.   Therapy Documentation Precautions:  Precautions Precautions: Fall Restrictions Weight Bearing Restrictions: Yes RLE Weight Bearing: Non weight bearing Other Position/Activity Restrictions: s/p R BKA     Therapy/Group: Individual Therapy  [name] 08/24/2021, 7:19 AM

## 2021-08-25 ENCOUNTER — Ambulatory Visit (INDEPENDENT_AMBULATORY_CARE_PROVIDER_SITE_OTHER): Payer: Medicare Other | Admitting: Vascular Surgery

## 2021-08-25 ENCOUNTER — Encounter (INDEPENDENT_AMBULATORY_CARE_PROVIDER_SITE_OTHER): Payer: Medicare Other

## 2021-08-25 LAB — BASIC METABOLIC PANEL
Anion gap: 9 (ref 5–15)
BUN: 56 mg/dL — ABNORMAL HIGH (ref 8–23)
CO2: 24 mmol/L (ref 22–32)
Calcium: 6.4 mg/dL — CL (ref 8.9–10.3)
Chloride: 93 mmol/L — ABNORMAL LOW (ref 98–111)
Creatinine, Ser: 7.8 mg/dL — ABNORMAL HIGH (ref 0.61–1.24)
GFR, Estimated: 7 mL/min — ABNORMAL LOW (ref >60)
Glucose, Bld: 112 mg/dL — ABNORMAL HIGH (ref 70–99)
Potassium: 4.2 mmol/L (ref 3.5–5.1)
Sodium: 126 mmol/L — ABNORMAL LOW (ref 135–145)

## 2021-08-25 LAB — CBC
HCT: 23.8 % — ABNORMAL LOW (ref 39.0–52.0)
Hemoglobin: 7.4 g/dL — ABNORMAL LOW (ref 13.0–17.0)
MCH: 30.8 pg (ref 26.0–34.0)
MCHC: 31.1 g/dL (ref 30.0–36.0)
MCV: 99.2 fL (ref 80.0–100.0)
Platelets: 346 10*3/uL (ref 150–400)
RBC: 2.4 MIL/uL — ABNORMAL LOW (ref 4.22–5.81)
RDW: 16.2 % — ABNORMAL HIGH (ref 11.5–15.5)
WBC: 7 10*3/uL (ref 4.0–10.5)
nRBC: 0 % (ref 0.0–0.2)

## 2021-08-25 MED ORDER — DARBEPOETIN ALFA 60 MCG/0.3ML IJ SOSY
60.0000 ug | PREFILLED_SYRINGE | INTRAMUSCULAR | Status: DC
  Administered 2021-08-25: 60 ug via INTRAVENOUS
  Filled 2021-08-25 (×2): qty 0.3

## 2021-08-25 MED ORDER — SERTRALINE HCL 100 MG PO TABS
100.0000 mg | ORAL_TABLET | Freq: Every day | ORAL | Status: DC
  Administered 2021-08-26 – 2021-08-30 (×5): 100 mg via ORAL
  Filled 2021-08-25 (×5): qty 1

## 2021-08-25 NOTE — Progress Notes (Signed)
°   08/17/21 0814  Assess: MEWS Score  Temp 99.2 F (37.3 C)  BP 122/88  Pulse Rate (!) 111 (apical)  Resp 20  SpO2 100 %  O2 Device Nasal Cannula  O2 Flow Rate (L/min) 3 L/min  Assess: MEWS Score  MEWS Temp 0  MEWS Systolic 0  MEWS Pulse 2  MEWS RR 0  MEWS LOC 0  MEWS Score 2  MEWS Score Color Yellow  Assess: if the MEWS score is Yellow or Red  Were vital signs taken at a resting state? Yes  Focused Assessment No change from prior assessment  Does the patient meet 2 or more of the SIRS criteria? No  Does the patient have a confirmed or suspected source of infection? No  Early Detection of Sepsis Score *See Row Information* Low  MEWS guidelines implemented *See Row Information* No, previously yellow, continue vital signs every 4 hours  Treat  MEWS Interventions Other (Comment) (continue Q4 checks)  Escalate  MEWS: Escalate Yellow: discuss with charge nurse/RN and consider discussing with provider and RRT  Notify: Charge Nurse/RN  Name of Charge Nurse/RN Notified Kaanapali, RN  Date Charge Nurse/RN Notified 08/17/21  Time Charge Nurse/RN Notified 1814 (Charge nurse taking vitals)  Document  Patient Outcome Stabilized after interventions  Progress note created (see row info) Yes

## 2021-08-25 NOTE — Patient Care Conference (Signed)
Inpatient RehabilitationTeam Conference and Plan of Care Update Date: 08/25/2021   Time: 11:34 AM    Patient Name: Scott Castro      Medical Record Number: [id number]  Date of Birth: 14-Feb-1956 Sex: Male         Room/Bed: 5C06C/5C06C-01 Payor Info: Payor: MEDICARE / Plan: MEDICARE PART A AND B / Product Type: *No Product type* /    Admit Date/Time:  08/12/2021  1:20 PM  Primary Diagnosis:  S/P BKA (below knee amputation), right [facility])  Hospital Problems: Principal Problem:   S/P BKA (below knee amputation), right ([place]) Active Problems:   Ischemic foot ulcer due to atherosclerosis of native artery of limb ([place])   Depressive reaction    Expected Discharge Date: Expected Discharge Date: 08/31/21  Team Members Present: Physician leading conference: Dr. Courtney Heys Social Worker Present: Ovidio Kin, LCSW Nurse Present: Dorien Chihuahua, RN PT Present: Ailene Rud, PT OT Present: Lillia Corporal, OT SLP Present: Charolett Bumpers, SLP PPS Coordinator present : Gunnar Fusi, SLP     Current Status/Progress Goal Weekly Team Focus  Bowel/Bladder     HD; T, H, Sa; cont of bowel   HD; T, H, Sa; cont of bowel     Swallow/Nutrition/ Hydration             ADL's   Supervision bathing at seated level in shower, Setup UB dressing, Min A LB dressing, CGA-Min A stand pivot transfers, CGA lateral scoot transfers  Mod I but most likely will downgrade to supervision prior to d/c  ADL/IADL retraining, sit<stands, stand pivot transfers, d/c planning   Mobility   CGA sts, gait up to 30 ft, supervision bed mobility. Unable to navigate 6" step, and has not gotten ramp despite being told since eval he would need one  mod I, pt lives alone with very little support  transfers, gait, endurance   Communication             Safety/Cognition/ Behavioral Observations            Pain     Occasional prn med; and Zoloft   Pain managed with prn meds   Monitor need for and effectiveness of medication   Skin     Staples to amputation site; ecchymosis worsening. Dressing to heel   Skin healing; ecchymosis stable   Monitor skin q shift    Discharge Planning:  Working on ramp for home-possibly early next week to be complete- can not go home until this is completed due to goes to OP-HD. Cousin to stay with one week at DC. Pt will need to hire private trasnport to HD due to county transport will not cross [employer]. DME ordered and HH in place   Team Discussion: Patient is easily overwhelmed and remains depressed about situation however Zoloft has helped.  Patient on target to meet rehab goals: Currently remains on oxygen, but able to shower with supervision. Completes squat pivot transfers with supervision. Needs min assist for a stand pivot transfer.  Can hop from the chair to the [facility] with CGA.   *See Care Plan and progress notes for long and short-term goals.   Revisions to Treatment Plan:  Education for seated and lateral leans for dressing solo Extended LOS for ramp installation due to limited assist and HD   Teaching Needs: Safety, skin care, medication management, transfers, toileting, etc.  Current Barriers to Discharge: Home enviroment access/layout, Wound care, Lack of/limited family support, and Hemodialysis  Possible Resolutions to Barriers: Patient  and family education HH follow up services in place DME: DA-BSC, TTB, w/c with right amputee pad ordered Ramp installation scheduled SW assisting patient to arrange private pay transportation for HD     Medical Summary Current Status: pain controlled- only taking pain meds 1-2x/day- shrinker on R BKA; ESRD T/H/S  Barriers to Discharge: Decreased family/caregiver support;Hemodialysis;Home enviroment access/layout;Medical stability;Weight bearing restrictions;Weight;Wound care;Medication compliance  Barriers to Discharge Comments: home O2; social issues with ramp Possible Resolutions to [employer] Focus: needs to arrange  HD transport; overwhelmed easily-still  depressed per OT/PT- but is a little better- will increase Zoloft-  d/c- 1/23 Monday   Continued Need for Acute Rehabilitation Level of Care: The patient requires daily medical management by a physician with specialized training in physical medicine and rehabilitation for the following reasons: Direction of a multidisciplinary physical rehabilitation program to maximize functional independence : Yes Medical management of patient stability for increased activity during participation in an intensive rehabilitation regime.: Yes Analysis of laboratory values and/or radiology reports with any subsequent need for medication adjustment and/or medical intervention. : Yes   I attest that I was present, lead the team conference, and concur with the assessment and plan of the team.   Dorien Chihuahua B 08/25/2021, 3:25 PM

## 2021-08-25 NOTE — Progress Notes (Signed)
Date and time results received: 08/25/21 17:41   Test: Calcium Critical Value: 6.4  Name of Provider Notified: Jethro Bolus, [place]  Orders Received? Or Actions Taken?:  No new orders at this time.   Yehuda Mao, LPN

## 2021-08-25 NOTE — Progress Notes (Signed)
Physical Therapy Session Note  Patient Details  Name: Scott Castro MRN: [id number] Date of Birth: 15-Aug-1955  Today's Date: 08/25/2021 PT Individual Time: 0826-0930, [id number] PT Individual Time Calculation (min): 64 min, 56 min   Short Term Goals: Week 1:  PT Short Term Goal 1 (Week 1): Pt will transfer with supervision and LRAD PT Short Term Goal 1 - Progress (Week 1): Progressing toward goal PT Short Term Goal 2 (Week 1): Pt will navigate 6" curb step with RW PT Short Term Goal 2 - Progress (Week 1): Not met PT Short Term Goal 3 (Week 1): Pt will ambulate 20 ft with LRAD PT Short Term Goal 3 - Progress (Week 1): Met Week 2:  PT Short Term Goal 1 (Week 2): =LTGs d/t ELOS  Skilled Therapeutic Interventions/Progress Updates:    Session 1: Pt recd as hand off at end of OT session. Several minutes spent discussing home set up and d/c plan with pt and OT. It is possible that a ramp cannot be in place before early next week, so called his cousin about family training to bump up the stairs. She will let him know when she checks her availability. Nursing provided medication for 5/10 pain. Discussed need to call about adaptive transport after kidney specialist consult. Pt propelled w/c with BUE to therapy gym. Pt participated in playing horseshoes, passing horseshoe from one hand to the other for moment of no UE support, min A. CGA with UUE support on RW.  Pt with depressed affect throughout session. Extensive education on expectations of caring for himself at d/c (changing sock daily) and process/prognosis for receiving and using a prosthetic. Pt does not believe he will be able to return to full life at any time, even with prosthetic, despite extensive education. Pt propelled chair back to room and was left with all needs in reach and alarm active.   Session 2: Pt received in w/c with nursing present to take vitals. Assisted nursing d/t difficulty getting O2 reading and provided pt and friend [name]  with education throughout. Session heavily focused on education d/t pt's mood and increased pain in L great toe Instructed pt on performing skin inspections and how to preserve function and skin integrity. Continued education on prognosis and plan of care. Pt propelled w/c to/from room, including management of w/c parts with incr time. Pt participated in game of giant connect four in standing for dynamic balance training and improved participation. Pt returned to room and prepared to return to bed, but received phone call about dialysis transport. Notified NT to assist back to bed after pt finished.   Therapy Documentation Precautions:  Precautions Precautions: Fall Restrictions Weight Bearing Restrictions: Yes RLE Weight Bearing: Non weight bearing Other Position/Activity Restrictions: s/p R BKA     Therapy/Group: Individual Therapy  [name] 08/25/2021, 8:38 AM

## 2021-08-25 NOTE — Progress Notes (Signed)
PROGRESS NOTE   Subjective/Complaints:  Pt reports had good therapy yesterday- stood 30 times from chair; walked 30 ft x2 with RW- plans on ordering 3 Rw's total- also got trained on therabands- using them well.  Needs to make changes at home for being handicapped.  Also mentioned will NEVER get another amputation- doesn't care about if it causes death, doesn't want another one.   Per nursing, doesn't have O2 order- is on Home O2.   ROS:   Pt denies SOB, abd pain, CP, N/V/C/D, and vision changes   Objective:   No results found. No results for input(s): WBC, HGB, HCT, PLT in the last 72 hours.  No results for input(s): NA, K, CL, CO2, GLUCOSE, BUN, CREATININE, CALCIUM in the last 72 hours.   Intake/Output Summary (Last 24 hours) at 08/25/2021 0800 Last data filed at 08/25/2021 0300 Gross per 24 hour  Intake 587 ml  Output --  Net 587 ml        Physical Exam: Vital Signs Blood pressure 93/65, pulse 99, temperature 97.7 F (36.5 C), temperature source Oral, resp. rate 18, height 5\' 9"  (1.753 m), weight 73.7 kg, SpO2 99 %.      General: awake, alert, appropriate, sitting up in bed; finished breakfast; NAD HENT: conjugate gaze; oropharynx moist CV: regular rate; no JVD Pulmonary: CTA B/L; no W/R/R- good air movement GI: soft, NT, ND, (+)BS Psychiatric: appropriate Neurological: Ox3- alert  Ext: no clubbing, cyanosis- LLE BKA. Psych: pleasant and cooperative  Musculoskeletal:  LLE a few muscle spasms seen- not spasticity based on no increased tone    Left lower leg: Edema present but improving.   Skin:      08/22/21 image    1/17- Pic not taken- however has worsening bruising/ecchymoses on top of leg between patella and incision- dog ears haven't come in yet- staples in tact; no drainage.  Assessment/Plan: 1. Functional deficits which require 3+ hours per day of interdisciplinary therapy in a comprehensive  inpatient rehab setting. Physiatrist is providing close team supervision and 24 hour management of active medical problems listed below. Physiatrist and rehab team continue to assess barriers to discharge/monitor patient progress toward functional and medical goals  Care Tool:  Bathing  Bathing activity did not occur: Safety/medical concerns Body parts bathed by patient: Right arm, Left arm, Abdomen, Chest, Right upper leg, Left upper leg, Face, Front perineal area, Buttocks   Body parts bathed by helper: Left lower leg Body parts n/a: Right lower leg   Bathing assist Assist Level: Minimal Assistance - Patient > 75%     Upper Body Dressing/Undressing Upper body dressing Upper body dressing/undressing activity did not occur (including orthotics): Safety/medical concerns What is the patient wearing?: Pull over shirt    Upper body assist Assist Level: Set up assist    Lower Body Dressing/Undressing Lower body dressing      What is the patient wearing?: Pants, Underwear/pull up     Lower body assist Assist for lower body dressing: Minimal Assistance - Patient > 75%     Toileting Toileting    Toileting assist Assist for toileting: Contact Guard/Touching assist     Transfers Chair/bed transfer  Transfers  assist     Chair/bed transfer assist level: Minimal Assistance - Patient > 75%     Locomotion Ambulation   Ambulation assist   Ambulation activity did not occur: Safety/medical concerns  Assist level: Moderate Assistance - Patient 50 - 74% Assistive device: Walker-rolling Max distance: 20   Walk 10 feet activity   Assist  Walk 10 feet activity did not occur: Safety/medical concerns  Assist level: Moderate Assistance - Patient - 50 - 74% Assistive device: Walker-rolling   Walk 50 feet activity   Assist Walk 50 feet with 2 turns activity did not occur: Safety/medical concerns         Walk 150 feet activity   Assist Walk 150 feet activity did not  occur: Safety/medical concerns         Walk 10 feet on uneven surface  activity   Assist Walk 10 feet on uneven surfaces activity did not occur: Safety/medical concerns         Wheelchair     Assist Is the patient using a wheelchair?: Yes Type of Wheelchair: Manual    Wheelchair assist level: Supervision/Verbal cueing Max wheelchair distance: 250    Wheelchair 50 feet with 2 turns activity    Assist        Assist Level: Supervision/Verbal cueing   Wheelchair 150 feet activity     Assist      Assist Level: Supervision/Verbal cueing   Blood pressure 93/65, pulse 99, temperature 97.7 F (36.5 C), temperature source Oral, resp. rate 18, height 5\' 9"  (1.753 m), weight 73.7 kg, SpO2 99 %.  Medical Problem List and Plan: 1. Functional deficits secondary to recent right below the knee amputation secondary to critical limb ischemia, peripheral arterial disease, severe right lower extremity tibial artery disease.             -patient may shower but incision must be covered.              -ELOS/Goals: 1/18,   Supervision        con't CIR- PT and OT- pt says cannot get ramp by 1/18 and needs to extend stay- will d/w team at team conference today 2.  Antithrombotics: -DVT/anticoagulation:  Mechanical: Sequential compression devices, below knee Left lower extremity Pharmaceutical: Other (comment) apixaban             -antiplatelet therapy: aspirin  3. Pain Management:  .  1/5-changed Norco to Oxycodone 5-10 mg q4 hours prn- s  1/13- pain seems controlled. Have discussed massage, vision rx   -he's tolerating shrinker 11/17- only taking 1-2x/day- con't regimen 4. Mood: LCSW to evaluate and provide emotional support             -antipsychotic agents: Not applicable 5. Neuropsych: This patient is capable of making decisions on his own behalf. 6. Skin/Wound Care: tolerating shrinker ,using limb guard  -continue shrinker, +dressing if needed as he has occasional  drainage No drainage, + edema, ecchymosis unchanged   1/17- more bruising/ecchymoses under patella, but incision looks good-  7. Fluids/Electrolytes/Nutrition: Fluid restriction, monitor intake and daily weight 8.  End-stage renal disease on chronic intermittent hemodialysis.  T/T/S -HD at end of day to avoid conflicting with therapy -volume mgt per nephrology team, has had issues with hypotension a few times after HD  1/17- HD today- after therapy.  9: Atrial fibrillation: Continue Lopressor, Eliquis  1/17- rate controlled- con't regimen 10: Hyperlipidemia: Continue Lipitor 11: COPD/asthma: albuterol neb PRN  -1/13 pO2 61 on ABG yesterday   -  will need to go home on oxygen   -apparently has some equipment per above  1/16- Is on Home O2- will need to go home on Home O2- concerned about documentation of O2 sats of 60% this afternoon- on RA_ asked nursing to go eval pt and make sure if it's correct or not- and call me back if any issues  1/17- O2 drop yesterday was due to cold hands- sats in 90s- rewrote orders for home O2.  12: Renal osteodystrophy: Renvela and Sensipar as per nephrology 13: Barrett's Esophagus: Continue PPI 14: Anemia, iron deficiency/chronic disease: Aransep and Venofer per nephrology. 15: Peripheral arterial disease s/p right BKA on 12/30.  --Was discussed with Dr. Delana Meyer (on call vascular surgeon for Dr. Lucky Cowboy) regarding antiplatelet therapy>>recommends aspirin 81 mg daily --Follow-up with Dr. Lucky Cowboy as outpatient  45. Depression 1/5- started Zoloft 50 mg daily for reactive depression 1/17- much brighter affect, but did mention will NOT go through another amputation.   I spent a total of 36 minutes on total care- >50% coordination of care with team conference and d/w nursing.      LOS: 13 days A FACE TO FACE EVALUATION WAS PERFORMED  Vicki Pasqual 08/25/2021, 8:00 AM

## 2021-08-25 NOTE — Progress Notes (Addendum)
Patient ID: [name], male   DOB: 1956-08-01, 66 y.o.   MRN: [id number]  Met with pt to discuss team conference progress toward his goals and not at goal level yet will ned to extend due to this and not having a ramp in place yet. He is down today due to his left big toe is hurting just like his other foot started. Vascular MD to come see today and evaluate it. He does not feel he can go through losing another leg and feels it may just not be worth it. He confirms he is a glass half empty person and hopes it is not bad news. Talked with his cousin-Scott Castro via telephone to discuss scheduling education prior to pt's discharge. Now I=his discharge has been moved to 1/23. She will talk with her husband and get back with this worker to let know what time Friday they can come. Pt has called a private handicapped company to transport him to HD but it is expensive-$375.00 per week. Will call a few others to see if this is a comparable price. Will work on discharge for Monday.  2:47 PM Scott Castro-cousin left message she and her husband can be here Friday from 1:00-3:00 for education. Called her back to confirm this

## 2021-08-25 NOTE — Progress Notes (Signed)
Occupational Therapy Session Note  Patient Details  Name: Scott Castro MRN: [id number] Date of Birth: 1956/01/18  Today's Date: 08/25/2021 OT Individual Time: 6712-4580 OT Individual Time Calculation (min): 54 min    Short Term Goals: Week 1:  OT Short Term Goal 1 (Week 1): Pt will complete stand pivot to toilet using RW with min A. OT Short Term Goal 1 - Progress (Week 1): Met OT Short Term Goal 2 (Week 1): Pt will  sit to stand to RW with S to prep for LB self care., OT Short Term Goal 2 - Progress (Week 1): Progressing toward goal OT Short Term Goal 3 (Week 1): Pt will stand with CGA as he manages clothing over hips during self care tasks. OT Short Term Goal 3 - Progress (Week 1): Met OT Short Term Goal 4 (Week 1): Pt will complete simulated tub bench transfer with CGA. OT Short Term Goal 4 - Progress (Week 1): Met Week 2:  OT Short Term Goal 1 (Week 2): STG = LTG at Mod I   Skilled Therapeutic Interventions/Progress Updates:    Pt greeted at time of session semreclined in bed resting agreeable to OT session, pt with mild pain in residual limb after changing compression sock today but able to participate. Rest breaks PRN. Pt performed bed mob supine > sit Supervision and donned limb guard with cues Supervision. Squat pivot to R side bed > wheelchair Supervision/CGA. Self propel to sink and performed oral hygiene Mod I. Continued discussion for DC planning for home layout, assist at home, 1 step entry, ramp to be installed, etc. Pt transported to 4th floor ADL apartment and reviewed IADL techniques for accessing microwave and fridge from wheelchair level, able to demonstrate that he can use with Supervision only for cues to problem solve. Pt sit > stand at counter top CGA to retrieve various food items from shelving to simulate home environment. Transported back to room, alarm on call bell in reach.     Therapy Documentation Precautions:  Precautions Precautions:  Fall Restrictions Weight Bearing Restrictions: Yes RLE Weight Bearing: Non weight bearing Other Position/Activity Restrictions: s/p R BKA    Therapy/Group: Individual Therapy  [name] 08/25/2021, 7:20 AM

## 2021-08-25 NOTE — Progress Notes (Addendum)
Morning regimen of medications given; dialysis today. BP Soft Notified Risa Grill , [place]. Per PA check blood pressure Q hour until dialysis. Patient asymptomatic at this time. VSS. Notified Tacy Learn, RN-Charge Nurse Colletta Maryland, RN-Charge Nurse/Hemodialysis Clarise Cruz, RN-Hemodialysis     Patient returned from dialysis with no issues. VSS.

## 2021-08-25 NOTE — Progress Notes (Signed)
°  [facility] Progress Note   66 y.o. male PMH COPD Barrett's, hyperparathyroidism, ESRD, failed renal transplant, PAF on Eliquis p/w ischemic foot ulcer -> BKA on 12/30.  Patient receives his dialysis treatments at [place] with [facility].   Dialysis at [facility] with [facility] EDW 68.4kg? EDW before the amputation was 72kg   Elisio 16  Assessment/ Plan:   ESRD - TTS at [name] w/ [facility] - establishing new EDW - back on TTS schedule - next HD today  [name]- last Phos 1/1 was 5; 1/16 phos 3.4 [name] 3 tabs TIDM. [name] 60mg  qdaily. Anemia - will dose ESA on Aranesp 31mcg qTues; 10% sats on 1/1--> getting IV iron load with HD. Hbg 7.7. Transfuse PRN.  PAD with critical limb ischemia s/p rt BKA for gangrene PAF - rate controlled on Eliquis.  ON dilt and metop. GERD COPD Dispo: planned d/c 1/18 per notes - issue with transportation to HD, lives in [place] in [place].  Does not wish to change HD units.  SW working on it.  Ultimately plans to drive again but tells me this will be 6+ mo from now.   Subjective:    Doing fine today.  Making d/c arrangements - may be delayed until Friday based on PT in room.  BP 93/65 (BP Location: Right Arm)    Pulse 99    Temp 97.7 F (36.5 C) (Oral)    Resp 18    Ht 5\' 9"  (1.753 m)    Wt 73.7 kg    SpO2 99%    BMI 23.99 kg/m   Intake/Output Summary (Last 24 hours) at 08/25/2021 0914 Last data filed at 08/25/2021 0825 Gross per 24 hour  Intake 824 ml  Output --  Net 824 ml    Weight change: -0.7 kg  Physical Exam: GEN: NAD, A&Ox3, NCAT HEENT:  LUNGS: CTA B/L no rales, rhonchi or wheezing CV: irregular irregular, crescendo murmur ABD: SNDNT +BS  EXT:R BKA, Trace LE edema, improved ACCESS: Lt BBT--> distal portion is aneurysmal +t/b  Imaging: No results found.  Labs: BMET Recent Labs  Lab 08/18/21 1412 08/20/21 1312  NA 132* 132*  K 4.3 3.8  CL 94* 94*  CO2 26 26  GLUCOSE 97  115*  BUN 38* 38*  CREATININE 6.08* 6.16*  CALCIUM 7.4* 7.5*  PHOS 4.5 3.4    CBC Recent Labs  Lab 08/18/21 1413 08/20/21 1312  WBC 7.7 9.4  HGB 7.7* 7.7*  HCT 24.5* 25.8*  MCV 99.6 99.6  PLT 359 430*     Medications:     apixaban  5 mg Oral BID   aspirin EC  81 mg Oral Daily   atorvastatin  10 mg Oral QHS   cinacalcet  60 mg Oral Q supper   Darbepoetin Alfa  60 mcg Subcutaneous Q Tue-1800   diltiazem  180 mg Oral Daily   gabapentin  100 mg Oral BID   hydrocortisone  25 mg Rectal BID   metoprolol tartrate  12.5 mg Oral BID   pantoprazole  40 mg Oral Daily   senna-docusate  2 tablet Oral BID   sertraline  50 mg Oral Daily   sevelamer carbonate  2.4 g Oral TID WC    Jannifer Hick MD [facility] Pager [phone]

## 2021-08-26 ENCOUNTER — Inpatient Hospital Stay (HOSPITAL_COMMUNITY): Payer: Medicare Other

## 2021-08-26 DIAGNOSIS — Z992 Dependence on renal dialysis: Secondary | ICD-10-CM

## 2021-08-26 DIAGNOSIS — Z7902 Long term (current) use of antithrombotics/antiplatelets: Secondary | ICD-10-CM

## 2021-08-26 DIAGNOSIS — N186 End stage renal disease: Secondary | ICD-10-CM

## 2021-08-26 DIAGNOSIS — Z8249 Family history of ischemic heart disease and other diseases of the circulatory system: Secondary | ICD-10-CM

## 2021-08-26 DIAGNOSIS — K2270 Barrett's esophagus without dysplasia: Secondary | ICD-10-CM

## 2021-08-26 DIAGNOSIS — I70222 Atherosclerosis of native arteries of extremities with rest pain, left leg: Secondary | ICD-10-CM

## 2021-08-26 DIAGNOSIS — Z7982 Long term (current) use of aspirin: Secondary | ICD-10-CM

## 2021-08-26 DIAGNOSIS — J449 Chronic obstructive pulmonary disease, unspecified: Secondary | ICD-10-CM

## 2021-08-26 DIAGNOSIS — Z89511 Acquired absence of right leg below knee: Secondary | ICD-10-CM

## 2021-08-26 LAB — CBC WITH DIFFERENTIAL/PLATELET
Abs Immature Granulocytes: 0.04 10*3/uL (ref 0.00–0.07)
Basophils Absolute: 0 10*3/uL (ref 0.0–0.1)
Basophils Relative: 0 %
Eosinophils Absolute: 0 10*3/uL (ref 0.0–0.5)
Eosinophils Relative: 0 %
HCT: 23.1 % — ABNORMAL LOW (ref 39.0–52.0)
Hemoglobin: 7.2 g/dL — ABNORMAL LOW (ref 13.0–17.0)
Immature Granulocytes: 1 %
Lymphocytes Relative: 11 %
Lymphs Abs: 0.8 10*3/uL (ref 0.7–4.0)
MCH: 30.9 pg (ref 26.0–34.0)
MCHC: 31.2 g/dL (ref 30.0–36.0)
MCV: 99.1 fL (ref 80.0–100.0)
Monocytes Absolute: 0.9 10*3/uL (ref 0.1–1.0)
Monocytes Relative: 12 %
Neutro Abs: 5.7 10*3/uL (ref 1.7–7.7)
Neutrophils Relative %: 76 %
Platelets: 276 10*3/uL (ref 150–400)
RBC: 2.33 MIL/uL — ABNORMAL LOW (ref 4.22–5.81)
RDW: 16 % — ABNORMAL HIGH (ref 11.5–15.5)
WBC: 7.5 10*3/uL (ref 4.0–10.5)
nRBC: 0 % (ref 0.0–0.2)

## 2021-08-26 LAB — RENAL FUNCTION PANEL
Albumin: 1.8 g/dL — ABNORMAL LOW (ref 3.5–5.0)
Anion gap: 9 (ref 5–15)
BUN: 31 mg/dL — ABNORMAL HIGH (ref 8–23)
CO2: 27 mmol/L (ref 22–32)
Calcium: 7.3 mg/dL — ABNORMAL LOW (ref 8.9–10.3)
Chloride: 95 mmol/L — ABNORMAL LOW (ref 98–111)
Creatinine, Ser: 4.82 mg/dL — ABNORMAL HIGH (ref 0.61–1.24)
GFR, Estimated: 13 mL/min — ABNORMAL LOW (ref >60)
Glucose, Bld: 134 mg/dL — ABNORMAL HIGH (ref 70–99)
Phosphorus: 2.7 mg/dL (ref 2.5–4.6)
Potassium: 3.6 mmol/L (ref 3.5–5.1)
Sodium: 131 mmol/L — ABNORMAL LOW (ref 135–145)

## 2021-08-26 LAB — GLUCOSE, CAPILLARY: Glucose-Capillary: 162 mg/dL — ABNORMAL HIGH (ref 70–99)

## 2021-08-26 MED ORDER — MIDODRINE HCL 5 MG PO TABS
10.0000 mg | ORAL_TABLET | Freq: Three times a day (TID) | ORAL | Status: DC
  Administered 2021-08-26 – 2021-08-30 (×14): 10 mg via ORAL
  Filled 2021-08-26 (×14): qty 2

## 2021-08-26 MED ORDER — HEPARIN (PORCINE) 25000 UT/250ML-% IV SOLN
1800.0000 [IU]/h | INTRAVENOUS | Status: DC
  Administered 2021-08-26: 1100 [IU]/h via INTRAVENOUS
  Administered 2021-08-28: 1800 [IU]/h via INTRAVENOUS
  Filled 2021-08-26 (×3): qty 250

## 2021-08-26 MED ORDER — DILTIAZEM HCL ER COATED BEADS 120 MG PO CP24
120.0000 mg | ORAL_CAPSULE | Freq: Every day | ORAL | Status: DC
  Administered 2021-08-27 – 2021-08-30 (×4): 120 mg via ORAL
  Filled 2021-08-26 (×4): qty 1

## 2021-08-26 MED ORDER — HEPARIN (PORCINE) 25000 UT/250ML-% IV SOLN
1100.0000 [IU]/h | INTRAVENOUS | Status: DC
  Filled 2021-08-26: qty 250

## 2021-08-26 NOTE — Progress Notes (Signed)
ANTICOAGULATION CONSULT NOTE - Initial Consult  Pharmacy Consult for apixaban>>heparin Indication: atrial fibrillation  No Known Allergies  Patient Measurements: Height: 5\' 9"  (175.3 cm) Weight: 73.1 kg (161 lb 2.5 oz) IBW/kg (Calculated) : 70.7 Heparin Dosing Weight: 73kg  Vital Signs: Temp: 100 F (37.8 C) (01/18 0817) Temp Source: Oral (01/18 0817) BP: 88/52 (01/18 0825) Pulse Rate: 110 (01/18 0817)  Labs: Recent Labs    08/25/21 1639 08/26/21 1004  HGB 7.4* 7.2*  HCT 23.8* 23.1*  PLT 346 276  CREATININE 7.80*  --     Estimated Creatinine Clearance: 9.3 mL/min (A) (by C-G formula based on SCr of 7.8 mg/dL (H)).   Medical History: Past Medical History:  Diagnosis Date   Barrett's esophagus    Chronic kidney disease    CHRONIC\   Glomerulosclerosis, focal    Hyperparathyroidism due to renal insufficiency (HCC)     Medications:  Medications Prior to Admission  Medication Sig Dispense Refill Last Dose   albuterol (VENTOLIN HFA) 108 (90 Base) MCG/ACT inhaler Inhale 2 puffs into the lungs every 6 (six) hours as needed for wheezing or shortness of breath.      apixaban (ELIQUIS) 5 MG TABS tablet Take 1 tablet (5 mg total) by mouth 2 (two) times daily. 60 tablet 0    atorvastatin (LIPITOR) 10 MG tablet Take 1 tablet (10 mg total) by mouth at bedtime. 30 tablet 0    cinacalcet (SENSIPAR) 60 MG tablet Take 60 mg by mouth daily.      diclofenac Sodium (VOLTAREN) 1 % GEL Apply 2 g topically 4 (four) times daily. Use on feet      diltiazem (CARDIZEM CD) 120 MG 24 hr capsule Take 1 capsule (120 mg total) by mouth daily. Hold for sbp < 100      feeding supplement (ENSURE ENLIVE / ENSURE PLUS) LIQD Take 237 mLs by mouth 2 (two) times daily between meals. 237 mL 12    gabapentin (NEURONTIN) 100 MG capsule Take 1 capsule (100 mg total) by mouth 2 (two) times daily.      HYDROcodone-acetaminophen (NORCO) 7.5-325 MG tablet Take 1 tablet by mouth 3 (three) times daily as needed.       methocarbamol (ROBAXIN) 500 MG tablet Take 1 tablet (500 mg total) by mouth every 8 (eight) hours as needed for muscle spasms.      metoprolol tartrate (LOPRESSOR) 25 MG tablet Take 0.5 tablets (12.5 mg total) by mouth 2 (two) times daily.      pantoprazole (PROTONIX) 40 MG tablet Take 40 mg by mouth daily.      polyethylene glycol (MIRALAX / GLYCOLAX) 17 g packet Take 17 g by mouth daily as needed. 14 each 0    senna-docusate (SENOKOT-S) 8.6-50 MG tablet Take 2 tablets by mouth 2 (two) times daily.      sevelamer carbonate (RENVELA) 800 MG tablet Take by mouth See admin instructions. Take 2400 mg by mouth three times daily, 1600 mg with snacks      Scheduled:   aspirin EC  81 mg Oral Daily   atorvastatin  10 mg Oral QHS   cinacalcet  60 mg Oral Q supper   Darbepoetin Alfa  60 mcg Intravenous Q Tue-HD   [START ON 08/27/2021] diltiazem  120 mg Oral Daily   gabapentin  100 mg Oral BID   hydrocortisone  25 mg Rectal BID   metoprolol tartrate  12.5 mg Oral BID   midodrine  10 mg Oral TID WC   pantoprazole  40 mg Oral Daily   senna-docusate  2 tablet Oral BID   sertraline  100 mg Oral Daily   sevelamer carbonate  2.4 g Oral TID WC   Infusions:   ferric gluconate (FERRLECIT) IVPB 125 mg (08/22/21 2111)    Assessment: Pt recent got right BKA for PAD. He has been on apixaban her for his AF. Pt will left angiogram now so apixaban has been ordered to be change IV heparin. His last dose was 0827 this AM. We will monitor with PTT for now.   Hgb 7.2, plt wnl Scr - ESRD  Goal of Therapy:  Heparin level 0.3-0.7 units/ml PTT 66-102 Monitor platelets by anticoagulation protocol: Yes   Plan:  Heparin infusion at 1100 units/hr 8 hr PTT tonight Daily PTT and HL, CBC  Onnie Boer, PharmD, BCIDP, AAHIVP, CPP Infectious Disease Pharmacist 08/26/2021 10:28 AM

## 2021-08-26 NOTE — Progress Notes (Signed)
Physical Therapy Session Note  Patient Details  Name: Scott Castro MRN: [id number] Date of Birth: 1956/04/18  Today's Date: 08/26/2021 PT Individual Time: 0110-0349 PT Individual Time Calculation (min): 55 min   Short Term Goals: Week 1:  PT Short Term Goal 1 (Week 1): Pt will transfer with supervision and LRAD PT Short Term Goal 1 - Progress (Week 1): Progressing toward goal PT Short Term Goal 2 (Week 1): Pt will navigate 6" curb step with RW PT Short Term Goal 2 - Progress (Week 1): Not met PT Short Term Goal 3 (Week 1): Pt will ambulate 20 ft with LRAD PT Short Term Goal 3 - Progress (Week 1): Met Week 2:  PT Short Term Goal 1 (Week 2): =LTGs d/t ELOS  Skilled Therapeutic Interventions/Progress Updates:    Pt seated in w/c on arrival and agreeable to therapy. Pt reports pain managed on medication at this time. Monitored vitals throughout, stable throughout. Pt did desat with activity, as low as 73%, but resaturated quickly with VC for breathing/pacing and/or seated rest break. Pt propelled w/c with BUE throughout session, >250 ft, including managing leg rests and brakes. Pt ambulated with RW and hop to pattern, largely CGA x 25 steps, x 16, and x 25 with seated rest breaks. Extended rest breaks required to maintain O2 saturation. Pt requested horseshoes, and participated in 2 rounds of standing horseshoes for balance and standing tolerance. Pt returned to room and remained in w/c, was left with all needs in reach and alarm active.   Therapy Documentation Precautions:  Precautions Precautions: Fall Restrictions Weight Bearing Restrictions: Yes RLE Weight Bearing: Non weight bearing Other Position/Activity Restrictions: s/p R BKA General: PT Amount of Missed Time (min): 15 Minutes PT Missed Treatment Reason: Patient fatigue;Patient ill (Comment)    Therapy/Group: Individual Therapy  [name] 08/26/2021, 12:00 PM

## 2021-08-26 NOTE — Significant Event (Addendum)
Rapid Response Event Note   Reason for Call :  Hypotension  Initial Focused Assessment:  Pt lying in bed, AO. Lung sounds are clear. No distress. Abnormal heart sound, irregular rate. Skin is warm, dry, non-tenting. Mucous membranes are dry. Pt endorses pain to his left leg, petechia noted- MD aware and further work-up underway. Otherwise, no complaints.   VS: T 100, BP 85/58, HR 110, RR 16, SpO2 100% on 2LNC CBG: 162  Interventions:  -Oral intake -Manual VS check -CBC, renal panel ordered   Plan of Care:  -Encourage oral intake of 500 cc water- increase today's fluid restriction to 1700 cc -Increase frequency of VS checks -MD to consider Midodrine if hypotension continues follow oral hydration  Event Summary:  MD Notified: Dr. Dagoberto Ligas Call Time: 0808 Arrival Time: 9728 End Time: [place], RN

## 2021-08-26 NOTE — Progress Notes (Signed)
Orthopedic Tech Progress Note Patient Details:  Scott Castro September 20, 1955 [id number]  Called in order to [employer] for a PRAFO BOOT  Patient ID: Scott Castro, male   DOB: May 27, 1956, 66 y.o.   MRN: [id number]  [name] 08/26/2021, 4:08 PM

## 2021-08-26 NOTE — Progress Notes (Signed)
[facility] Progress Note   66 y.o. male PMH COPD Barrett's, hyperparathyroidism, ESRD, failed renal transplant, PAF on Eliquis p/w ischemic foot ulcer -> BKA on 12/30.  Patient receives his dialysis treatments at [place] with [facility].   Dialysis at [facility] with [facility] EDW 68.4kg? EDW before the amputation was 72kg   Elisio 17  Assessment/ Plan:   ESRD - TTS at [facility] w/ [facility] - establishing new EDW - tolerated UF with HD yesterday but BP is down this AM - reassess EDW tomorrow - back on TTS schedule - next HD tomorrow  [name]- last Phos 1/1 was 5; 1/16 phos 3.4 [name] 3 tabs TIDM. [name] 60mg  qdaily. Anemia - will dose ESA on Aranesp 84mcg qTues; 10% sats on 1/1--> getting IV iron load with HD. Hbg 7.7. Transfuse PRN.  PAD with critical limb ischemia s/p rt BKA for gangrene - now with new L foot change and plans for eval prior to d/c VVS consulted today PAF - rate controlled on Eliquis.  ON dilt and metop - in setting of low BP today dec dilt from 180 to 120, cont metop.  GERD COPD 8.  Hypotension: asymptomatic low BP this am - dec CCB dose, give midodrine 10 TID for now.  Primary doing infection w/u.   9. Dispo: private transport to [place] HD unit from his [place] home too costly, asked to transfer to [place] - notified DW of change  Subjective:    Low BP this AM - he feels fine.  T 100 overnight too.  Mild sore throat for a few days.   L foot worse - VVS going to do angiogram while here.   BP (!) 80/62 (BP Location: Right Arm)    Pulse 98    Temp 98 F (36.7 C) (Oral)    Resp 16    Ht 5\' 9"  (1.753 m)    Wt 73.1 kg    SpO2 100%    BMI 23.80 kg/m   Intake/Output Summary (Last 24 hours) at 08/26/2021 1155 Last data filed at 08/25/2021 1737 Gross per 24 hour  Intake --  Output 1100 ml  Net -1100 ml    Weight change: 0.1 kg  Physical Exam: GEN: NAD, A&Ox3, NCAT HEENT:  LUNGS: CTA B/L no rales, rhonchi or  wheezing CV: irregular irregular, crescendo murmur ABD: SNDNT +BS  EXT:R BKA, pic of L foot in VVS note  ACCESS: Lt BBT--> distal portion is aneurysmal +t/b  Imaging: No results found.  Labs: BMET Recent Labs  Lab 08/20/21 1312 08/25/21 1639 08/26/21 1004  NA 132* 126* 131*  K 3.8 4.2 3.6  CL 94* 93* 95*  CO2 26 24 27   GLUCOSE 115* 112* 134*  BUN 38* 56* 31*  CREATININE 6.16* 7.80* 4.82*  CALCIUM 7.5* 6.4* 7.3*  PHOS 3.4  --  2.7    CBC Recent Labs  Lab 08/20/21 1312 08/25/21 1639 08/26/21 1004  WBC 9.4 7.0 7.5  NEUTROABS  --   --  5.7  HGB 7.7* 7.4* 7.2*  HCT 25.8* 23.8* 23.1*  MCV 99.6 99.2 99.1  PLT 430* 346 276     Medications:     aspirin EC  81 mg Oral Daily   atorvastatin  10 mg Oral QHS   cinacalcet  60 mg Oral Q supper   Darbepoetin Alfa  60 mcg Intravenous Q Tue-HD   [START ON 08/27/2021] diltiazem  120 mg Oral Daily   gabapentin  100 mg Oral BID   hydrocortisone  25 mg Rectal BID   metoprolol tartrate  12.5 mg Oral BID   midodrine  10 mg Oral TID WC   pantoprazole  40 mg Oral Daily   senna-docusate  2 tablet Oral BID   sertraline  100 mg Oral Daily   sevelamer carbonate  2.4 g Oral TID WC    Jannifer Hick MD [facility] Pager [phone]

## 2021-08-26 NOTE — Progress Notes (Addendum)
Patient complaining of new onset left great toe pain with petechiae, slightly warm to the touch. Temp 100.3 rechecked 98.6. Discussed with  Dr Dagoberto Ligas at nurses station. No orders given at this time.

## 2021-08-26 NOTE — Progress Notes (Signed)
Patient continues to be alert and oriented x4, no signs or symptoms of distress noted. monitored closely throughout the shift. Risa Grill, PA aware.

## 2021-08-26 NOTE — Progress Notes (Signed)
Occupational Therapy Session Note  Patient Details  Name: OSHER OETTINGER MRN: [id number] Date of Birth: 1956/06/17  Today's Date: 08/26/2021 OT Missed Time: 63 Minutes Missed Time Reason: Patient ill (comment);Nursing care   Short Term Goals: Week 1:  OT Short Term Goal 1 (Week 1): Pt will complete stand pivot to toilet using RW with min A. OT Short Term Goal 1 - Progress (Week 1): Met OT Short Term Goal 2 (Week 1): Pt will  sit to stand to RW with S to prep for LB self care., OT Short Term Goal 2 - Progress (Week 1): Progressing toward goal OT Short Term Goal 3 (Week 1): Pt will stand with CGA as he manages clothing over hips during self care tasks. OT Short Term Goal 3 - Progress (Week 1): Met OT Short Term Goal 4 (Week 1): Pt will complete simulated tub bench transfer with CGA. OT Short Term Goal 4 - Progress (Week 1): Met Week 2:  OT Short Term Goal 1 (Week 2): STG = LTG at Mod I   Skilled Therapeutic Interventions/Progress Updates:    Pt greeted at time of session bed level resting with PA and nursing in room, MD has been called for red mews, low BP, vital signs. Recommended to skip OT session this morning 2/2 medical instability. Missed 45 mins of OT. Will attempt to make up time as able.  Therapy Documentation Precautions:  Precautions Precautions: Fall Restrictions Weight Bearing Restrictions: Yes RLE Weight Bearing: Non weight bearing Other Position/Activity Restrictions: s/p R BKA     Therapy/Group: Individual Therapy  [name] 08/26/2021, 7:18 AM

## 2021-08-26 NOTE — Progress Notes (Addendum)
Alert and oriented x4, lying in bed.  Change in vital signs noted. VS: T 100, BP 85/58, HR 110, RR16, SP02 100% via 2LNC. Notified Dr. Trilby Leaver Nurse Pillsbury Odom,RN-Rapid Reponse Risa Grill, [place] New orders placed.

## 2021-08-26 NOTE — Progress Notes (Signed)
Physical Therapy Session Note  Patient Details  Name: JAQUAWN SAFFRAN MRN: [id number] Date of Birth: 1956/04/07  Today's Date: 08/26/2021 PT Individual Time: 1000-1050 and 1410-1435 PT Individual Time Calculation (min): 50 min and 25 min  Short Term Goals: Week 2:  PT Short Term Goal 1 (Week 2): =LTGs d/t ELOS  Skilled Therapeutic Interventions/Progress Updates: Pt presented in bed agreeable to therapy. Pt states some mild pain in L foot, denies pain in residual limb at this time. PTA donned larger bari sock on L foot and pt performed bed mobility with supervision and use of bed features. BP checked in supine prior to bed mobility and in sitting with minimal change as noted below therefore PTA advised to have pt notify if symptomatic. Pt stood EOB requiring x 3 attempting due to fatigue and ambulated ~10f at supervision level with RW and w/c follow. Pt then propelled remaining distance with supervision to therapy room >1060f In room pt participate din UE therex for general conditioning. Pt performed chest press, shoulder circles, and shoulder flexion to 90 2 x 10 with 4lb dowel. Pt required increased breaks during second set due to fatigue. Pt transported back to room at end of session and requested to remain in w/c. Pt left with call bell within reach and needs met.   BP supine 84/60 (68) HR 102 BP standing 80/62 (67) HR 97   Tx2: Pt presented in w/c with friend MaLinton Rumpresent agreeable to therapy. Pt states some mild pain at L foot. Pt requesting to use bathroom. Pt proplled self to bathroom door and performed stand pivot transfer to toilet with CGA. Pt was CGA to close S for clothing management. Pt required increased time for BM and was able to perform peri-care mod I. Once completed pt was able to stand with close S and pull up pants with close S. Pt then returned to w/c in same manner as prior. Pt propelled to sink and was handed off to LeFriersonT for vitals check.      Therapy  Documentation Precautions:  Precautions Precautions: Fall Restrictions Weight Bearing Restrictions: Yes RLE Weight Bearing: Non weight bearing Other Position/Activity Restrictions: s/p R BKA General: PT Amount of Missed Time (min): 15 Minutes PT Missed Treatment Reason: Other (Comment) Vital Signs: Therapy Vitals Temp: 99.9 F (37.7 C) Pulse Rate: (!) 101 Resp: 17 BP: 102/70 Patient Position (if appropriate): Sitting Oxygen Therapy SpO2: 91 % O2 Device: Nasal Cannula O2 Flow Rate (L/min): 2 L/min Pain:   Mobility:   Locomotion :    Trunk/Postural Assessment :    Balance:   Exercises:   Other Treatments:      Therapy/Group: Individual Therapy  Shayne Diguglielmo 08/26/2021, 4:14 PM

## 2021-08-26 NOTE — Progress Notes (Signed)
Physical Therapy Session Note  Patient Details  Name: Scott Castro MRN: [id number] Date of Birth: [birth year]  Today's Date: 08/26/2021 PT Individual Time: 0905-0920 PT Individual Time Calculation (min): 15 min   Short Term Goals:  Week 2:  PT Short Term Goal 1 (Week 2): =LTGs d/t ELOS  Skilled Therapeutic Interventions/Progress Updates:   Pt received supine in bed. PA present in room for assessment of RLE. Pt reported pain in the L great toe, 2/10. Deep ache. PT performed VS assessment. 80/57, HR 97. RN aware of low BP with orders for pt to increase fluid prior to PT treatment. PT assisted pt in scooting towards HOB to decreased pressure of LLE on foot board, with mod assist, and only minimal assist from pt. Pt reporting increased fatigue as well as need to speek to DME company about delivery of equipment to house. Left supine in bed with call bell in reach and all needs met.      Therapy Documentation Precautions:  Precautions Precautions: Fall Restrictions Weight Bearing Restrictions: Yes RLE Weight Bearing: Non weight bearing Other Position/Activity Restrictions: s/p R BKA General: PT Amount of Missed Time (min): 15 Minutes PT Missed Treatment Reason: Patient fatigue;Patient ill (Comment) Vital Signs: Therapy Vitals Temp: 100 F (37.8 C) Temp Source: Oral Pulse Rate: (!) 110 Resp: 16 BP: (!) 80/57 Patient Position (if appropriate): Lying Oxygen Therapy SpO2: 100 % O2 Device: Nasal Cannula O2 Flow Rate (L/min): 2 L/min    Therapy/Group: Individual Therapy  Lorie Phenix 08/26/2021, 9:33 AM

## 2021-08-26 NOTE — Consult Note (Signed)
[facility] [place] CONSULT NOTE   MRN : [id number]  Reason for Consult: Left GT pain and ischemic skin changes to left heel.   Referring Physician: Risa Grill PA-C   History of Present Illness: 66 y/o male admitted to CIR from [place] s/p right  BKA by Dr. Lucky Cowboy.  The right LE started with a GT wound.  He underwent arteriogram on 12/29 by Dr. Lucky Cowboy with right TP trunk and posterior tibial artery angioplasty as well as stent placement to the right external iliac artery.  No further intervention was available and he ultimately ended up with right BKA.  We have been consulted for left GT toe pain and heel wound/ischemic changes.  He denies intervention on the left LE.  He denies history of rest pain or claudication on the left LE.  Sensation decreased with intact motor of left toes and ankle.    Past medical history: COVID pneumonia 11/22, COPD, Barett's esophagus, ESRD on HD [place].         Current Facility-Administered Medications  Medication Dose Route Frequency Provider Last Rate Last Admin   acetaminophen (TYLENOL) tablet 325-650 mg  325-650 mg Oral Q4H PRN Barbie Banner, PA-C   650 mg at 08/19/21 0929   albuterol (PROVENTIL) (2.5 MG/3ML) 0.083% nebulizer solution 3 mL  3 mL Inhalation Q6H PRN Setzer, Edman Circle, PA-C       apixaban (ELIQUIS) tablet 5 mg  5 mg Oral BID Barbie Banner, PA-C   5 mg at 08/26/21 0827   aspirin EC tablet 81 mg  81 mg Oral Daily Barbie Banner, PA-C   81 mg at 08/26/21 0827   atorvastatin (LIPITOR) tablet 10 mg  10 mg Oral QHS Barbie Banner, PA-C   10 mg at 08/25/21 2100   bisacodyl (DULCOLAX) suppository 10 mg  10 mg Rectal Daily PRN Barbie Banner, PA-C       calcium carbonate (TUMS - dosed in mg elemental calcium) chewable tablet 200 mg of elemental calcium  1 tablet Oral QID PRN Barbie Banner, PA-C       cinacalcet (SENSIPAR) tablet 60 mg  60 mg Oral Q supper Barbie Banner, PA-C   60 mg at 08/25/21 1835    Darbepoetin Alfa (ARANESP) injection 60 mcg  60 mcg Intravenous Q Tue-HD Pham, Minh Q, RPH-CPP   60 mcg at 08/25/21 1645   diltiazem (CARDIZEM CD) 24 hr capsule 180 mg  180 mg Oral Daily Barbie Banner, PA-C   180 mg at 08/25/21 0843   diphenhydrAMINE (BENADRYL) 12.5 MG/5ML elixir 12.5-25 mg  12.5-25 mg Oral Q6H PRN Barbie Banner, PA-C   25 mg at 08/16/21 1743   ferric gluconate (FERRLECIT) 125 mg in sodium chloride 0.9 % 100 mL IVPB  125 mg Intravenous Q Steward Ros, MD 110 mL/hr at 08/22/21 2111 125 mg at 08/22/21 2111   gabapentin (NEURONTIN) capsule 100 mg  100 mg Oral BID Barbie Banner, PA-C   100 mg at 08/26/21 2637   guaiFENesin-dextromethorphan (ROBITUSSIN DM) 100-10 MG/5ML syrup 5-10 mL  5-10 mL Oral Q6H PRN Barbie Banner, PA-C       hydrocortisone (ANUSOL-HC) suppository 25 mg  25 mg Rectal BID Barbie Banner, PA-C   25 mg at 08/26/21 8588   metoprolol tartrate (LOPRESSOR) tablet 12.5 mg  12.5 mg Oral BID Barbie Banner, PA-C   12.5 mg at 08/26/21 0827   milk and molasses enema  1  enema Rectal PRN Barbie Banner, PA-C       oxyCODONE (Oxy IR/ROXICODONE) immediate release tablet 5-10 mg  5-10 mg Oral Q4H PRN Lovorn, Megan, MD   10 mg at 08/25/21 2103   pantoprazole (PROTONIX) EC tablet 40 mg  40 mg Oral Daily Barbie Banner, PA-C   40 mg at 08/26/21 0827   prochlorperazine (COMPAZINE) tablet 5-10 mg  5-10 mg Oral Q6H PRN Barbie Banner, PA-C       Or   prochlorperazine (COMPAZINE) injection 5-10 mg  5-10 mg Intramuscular Q6H PRN Barbie Banner, PA-C       Or   prochlorperazine (COMPAZINE) suppository 12.5 mg  12.5 mg Rectal Q6H PRN Setzer, Edman Circle, PA-C       senna-docusate (Senokot-S) tablet 1 tablet  1 tablet Oral QHS PRN Barbie Banner, PA-C   1 tablet at 08/20/21 2001   senna-docusate (Senokot-S) tablet 2 tablet  2 tablet Oral BID Barbie Banner, PA-C   2 tablet at 08/26/21 4098   sertraline (ZOLOFT) tablet 100 mg  100 mg Oral Daily Lovorn,  Megan, MD   100 mg at 08/26/21 0828   sevelamer carbonate (RENVELA) powder PACK 2.4 g  2.4 g Oral TID WC Rozann Lesches, RPH   2.4 g at 08/26/21 0827   sevelamer carbonate (RENVELA) tablet 1,600 mg  1,600 mg Oral TID PRN Rozann Lesches, RPH        Pt meds include: Statin :Yes Betablocker: Yes ASA: Yes Other anticoagulants/antiplatelets: Eliquis Afib  Past Medical History:  Diagnosis Date   Barrett's esophagus    Chronic kidney disease    CHRONIC\   Glomerulosclerosis, focal    Hyperparathyroidism due to renal insufficiency [facility])     Past Surgical History:  Procedure Laterality Date   AMPUTATION Right 08/07/2021   Procedure: AMPUTATION BELOW KNEE;  Surgeon: Algernon Huxley, MD;  Location: [facility] ORS;  Service: Vascular;  Laterality: Right;   AV FISTULA PLACEMENT     COLONOSCOPY N/A 03/22/2016   Procedure: COLONOSCOPY;  Surgeon: Manya Silvas, MD;  Location: [facility] ENDOSCOPY;  Service: Endoscopy;  Laterality: N/A;   DG AV DIALYSIS GRAFT DECLOT OR     ESOPHAGOGASTRODUODENOSCOPY (EGD) WITH PROPOFOL  03/22/2016   Procedure: ESOPHAGOGASTRODUODENOSCOPY (EGD) WITH PROPOFOL;  Surgeon: Manya Silvas, MD;  Location: [facility] ENDOSCOPY;  Service: Endoscopy;;   FLEXIBLE BRONCHOSCOPY     KIDNEY TRANSPLANT Right 1985   LOWER EXTREMITY ANGIOGRAPHY Right 07/20/2021   Procedure: Lower Extremity Angiography;  Surgeon: Algernon Huxley, MD;  Location: [place] CV LAB;  Service: Cardiovascular;  Laterality: Right;   LOWER EXTREMITY ANGIOGRAPHY Right 08/06/2021   Procedure: Lower Extremity Angiography;  Surgeon: Algernon Huxley, MD;  Location: [place] CV LAB;  Service: Cardiovascular;  Laterality: Right;   REMOVAL TENCKHOFF CATH      Social History Social History   Tobacco Use   Smoking status: Never   Smokeless tobacco: Never  Substance Use Topics   Alcohol use: Not Currently    Comment: very little   Drug use: No    Family History Family History  Problem Relation Age of Onset   Heart  disease Mother    Heart disease Father     No Known Allergies   REVIEW OF SYSTEMS  General: [ ]  Weight loss, [ ]  Fever, [ ]  chills Neurologic: [ ]  Dizziness, [ ]  Blackouts, [ ]  Seizure [ ]  Stroke, [ ]  "Mini stroke", [ ]  Slurred speech, [ ]   Temporary blindness; [ ]  weakness in arms or legs, [ ]  Hoarseness [ ]  Dysphagia Cardiac: [ ]  Chest pain/pressure, [ ]  Shortness of breath at rest [ ]  Shortness of breath with exertion, [ ]  Atrial fibrillation or irregular heartbeat  Vascular: [ ]  Pain in legs with walking, [x ] Pain in legs at rest, [ ]  Pain in legs at night,  [ x] Non-healing ulcer, [ ]  Blood clot in vein/DVT,   Pulmonary: [ ]  Home oxygen, [ ]  Productive cough, [ ]  Coughing up blood, [ ]  Asthma,  [ ]  Wheezing [ ]  COPD Musculoskeletal:  [ ]  Arthritis, [ ]  Low back pain, [ ]  Joint pain Hematologic: [ ]  Easy Bruising, [ ]  Anemia; [ ]  Hepatitis Gastrointestinal: [ ]  Blood in stool, [ ]  Gastroesophageal Reflux/heartburn, Urinary: [ ]  chronic Kidney disease, [ x] on HD - [ ]  MWF or [ ]  TTHS, [ ]  Burning with urination, [ ]  Difficulty urinating Skin: [ ]  Rashes, [ ]  Wounds Psychological: [ ]  Anxiety, [ ]  Depression  Physical Examination Vitals:   08/26/21 0807 08/26/21 0816 08/26/21 0817 08/26/21 0825  BP: (!) 80/47 (!) 85/58 (!) 85/58 (!) 88/52  Pulse: (!) 102 (!) 117 (!) 110   Resp: 16 16 16    Temp: 100 F (37.8 C) 100 F (37.8 C) 100 F (37.8 C)   TempSrc: Oral Oral Oral   SpO2: 100% 100% 100%   Weight:      Height:       Body mass index is 23.8 kg/m.  General:  WDWN in NAD HENT: WNL Eyes: Pupils equal Pulmonary: normal non-labored breathing , without Rales, rhonchi,  wheezing Cardiac: RRR, without  Murmurs, rubs or gallops; No carotid bruits Abdomen: soft, NT, no masses        Vascular Exam/Pulses:Radial, weak femoral pulses B, left Popliteal pulse, non palpable pedal pulses.  Ischemic lateral GT, tip second toe, lateral third toe and heel  wound.   Musculoskeletal: no muscle wasting or atrophy; no edema, right BKA compression in place.  Neurologic: A&O X 3; Appropriate Affect ;  SENSATION: normal; MOTOR FUNCTION: grossly intact left LE Speech is fluent/normal   Significant Diagnostic Studies: CBC Lab Results  Component Value Date   WBC 7.0 08/25/2021   HGB 7.4 (L) 08/25/2021   HCT 23.8 (L) 08/25/2021   MCV 99.2 08/25/2021   PLT 346 08/25/2021    BMET    Component Value Date/Time   NA 126 (L) 08/25/2021 1639   NA 142 06/10/2012 0924   K 4.2 08/25/2021 1639   K 4.5 06/16/2013 1315   CL 93 (L) 08/25/2021 1639   CL 102 06/10/2012 0924   CO2 24 08/25/2021 1639   CO2 29 06/10/2012 0924   GLUCOSE 112 (H) 08/25/2021 1639   GLUCOSE 83 06/10/2012 0924   BUN 56 (H) 08/25/2021 1639   BUN 35 (H) 06/10/2012 0924   CREATININE 7.80 (H) 08/25/2021 1639   CREATININE 10.29 (H) 06/10/2012 0924   CALCIUM 6.4 (LL) 08/25/2021 1639   CALCIUM 9.2 06/10/2012 0924   GFRNONAA 7 (L) 08/25/2021 1639   GFRNONAA 5 (L) 06/10/2012 0924   GFRAA 9 (L) 09/18/2019 0430   GFRAA 6 (L) 06/10/2012 0924   Estimated Creatinine Clearance: 9.3 mL/min (A) (by C-G formula based on SCr of 7.8 mg/dL (H)).  COAG Lab Results  Component Value Date   INR 1.7 (H) 08/05/2021   INR 1.1 07/18/2021   INR 1.2 07/03/2021     Non-Invasive Vascular Imaging:  ABI ordered on left LE Left ABI performed 07/18/21  +---------+------------------+-----+--------+-------+   Left      Lt Pressure (mmHg) Index Waveform Comment   +---------+------------------+-----+--------+-------+   ATA       250                      biphasic [place]        +---------+------------------+-----+--------+-------+   PTA       120                0.78  biphasic           +---------+------------------+-----+--------+-------+   Great Toe 65                 0.42  Abnormal           +---------+------------------+-----+--------+-------+  ASSESSMENT/PLAN:  PAD with history of right LE  intervention by Dr. Lucky Cowboy s/p right BKA Left LE rest pain left GT with mild ischemic changes noted in pictures above.    He will need left LE angiogram with runoff and possible intervention.  Previous right LE angiogram demonstrated The aorta was widely patent.  The left common and external iliac artery widely patent.  I did not have an available doppler on 5C, how ever I could palpate a popliteal pulse.    His Eliquis will need to be held starting today and will start Heparin.  Plan for angiogram with Dr. [name] 08/28/21.     Roxy Horseman 08/26/2021 8:53 AM

## 2021-08-26 NOTE — Progress Notes (Addendum)
Patient reports new pain to left great toe pain.  Prn pain medication given per md orders, off loaded left heel. Notified Risa Grill, PA. PA made face to face visit.    Scott Castro

## 2021-08-26 NOTE — Progress Notes (Signed)
Occupational Therapy Weekly Progress Note  Patient Details  Name: Scott Castro MRN: [id number] Date of Birth: September 13, 1955  Beginning of progress report period: August 20, 2021 End of progress report period: August 26, 2021    The pt did not have STGs for this reporting period as he is staying longer than originally planned 2/2 fluctuating BP, medical issues, and limited accessibility/lack of a ramp to get in/out of the house for dialysis. Pt is currently at [facility] for ADL transfers, Supervision for bed mobility, Supervision - Min A for LB self care tasks. Note pt is also self limiting at times and needs encouragement.   Patient continues to demonstrate the following deficits: muscle weakness, decreased cardiorespiratoy endurance, decreased coordination and decreased motor planning, decreased motor planning, and decreased sitting balance, decreased standing balance, decreased postural control, and decreased balance strategies and therefore will continue to benefit from skilled OT intervention to enhance overall performance with BADL, iADL, and Reduce care partner burden.  Patient progressing toward long term goals..  Continue plan of care.  OT Short Term Goals Week 2:  OT Short Term Goal 1 (Week 2): STG = LTG at Mod I OT Short Term Goal 1 - Progress (Week 2): Progressing toward goal Week 3:  OT Short Term Goal 1 (Week 3): STG = LTG 2/2 LOS at Mod I    Therapy Documentation Precautions:  Precautions Precautions: Fall Restrictions Weight Bearing Restrictions: Yes RLE Weight Bearing: Non weight bearing Other Position/Activity Restrictions: s/p R BKA     Therapy/Group: Individual Therapy  [name] 08/26/2021, 7:18 AM

## 2021-08-26 NOTE — Progress Notes (Addendum)
Patient reporting new onset of left great toe pain over the last several days.  Sensorimotor intact.  Dampened DP and PT Doppler signals, brisk peroneal signal.  Petechial skin changes.  Deep tissue injury of the skin of left heel.  No skin breakdown. Underwent arteriogram with Dr. Lucky Cowboy x 2 last month.  Right lower extremity evaluated: tibial artery angioplasty and right external iliac artery stent placed.  Patient has been on aspirin 81 mg daily.  Maintained on Eliquis for atrial fibrillation.  Contacted Dr. [name] for consultation.

## 2021-08-26 NOTE — Progress Notes (Signed)
Notified by Dr Johnney Ou that pt is interested in [facility] clinic due to transportation needs. Pt lives in [place] but current HD clinic is in [place]. Met with pt at bedside. Pt confirms he is agreeable to change clinics and prefers closet clinic to home. Discussed that [place] is the closest [facility] clinic. Pt agreeable to proceed with referral to [facility]. Pt is indifferent to days/times. Will start referral today. Contacted CIR SW to make her aware of clinic change so she can assist with transportation needs.Will assist.   Melven Sartorius Renal Navigator [phone]

## 2021-08-26 NOTE — Progress Notes (Signed)
PROGRESS NOTE   Subjective/Complaints:  Pt reports is having new onset pain in L great toe- and feels like it did before R leg amputated- said won't have another amputation- "would die first".   Not hurting as much this AM, but last night "really bad".  Also looks worse to him.   Also per nurse, spiked temp to 100.3 overnight, but repeat without tylenol was 98.6- this AM running 99-100 degrees- variable-  Of note, hasn't voided in months.   Checked CBC/diff and renal function panel to make sure no infection- was OK.   Had vascular called- and renal about low BP this AM when called back to room for BP of 78/40s- to see if midodrine was appropriate for pt.    ROS:   Pt denies SOB, abd pain, CP, N/V/C/D, and vision changes (+) L foot pain  Objective:   No results found. Recent Labs    08/25/21 1639 08/26/21 1004  WBC 7.0 7.5  HGB 7.4* 7.2*  HCT 23.8* 23.1*  PLT 346 276    Recent Labs    08/25/21 1639 08/26/21 1004  NA 126* 131*  K 4.2 3.6  CL 93* 95*  CO2 24 27  GLUCOSE 112* 134*  BUN 56* 31*  CREATININE 7.80* 4.82*  CALCIUM 6.4* 7.3*     Intake/Output Summary (Last 24 hours) at 08/26/2021 1544 Last data filed at 08/26/2021 1300 Gross per 24 hour  Intake 740 ml  Output 1100 ml  Net -360 ml        Physical Exam: Vital Signs Blood pressure (!) 105/58, pulse (!) 107, temperature (!) 100.5 F (38.1 C), resp. rate 16, height 5\' 9"  (1.753 m), weight 73.1 kg, SpO2 97 %.       General: awake, alert, appropriate, sitting up in bed; appears comfortable; 2nd time, staff at bedside, but pt on phone; again comfortable; NAD HENT: conjugate gaze; oropharynx moist CV: tachycardic rate; no JVD Pulmonary: CTA B/L; no W/R/R- good air movement GI: soft, NT, ND, (+)BS Psychiatric: appropriate Neurological: Ox3  Skin: mottled appearance of L foot from arch to toes and also on bottom of foot to base of  MTPs Ext: no clubbing, cyanosis- LLE BKA. Psych: pleasant and cooperative  Musculoskeletal:  LLE a few muscle spasms seen- not spasticity based on no increased tone    Left lower leg: Edema present but improving.        1/17- Pic not taken- however has worsening bruising/ecchymoses on top of leg between patella and incision- dog ears haven't come in yet- staples in tact; no drainage.  Assessment/Plan: 1. Functional deficits which require 3+ hours per day of interdisciplinary therapy in a comprehensive inpatient rehab setting. Physiatrist is providing close team supervision and 24 hour management of active medical problems listed below. Physiatrist and rehab team continue to assess barriers to discharge/monitor patient progress toward functional and medical goals  Care Tool:  Bathing  Bathing activity did not occur: Safety/medical concerns Body parts bathed by patient: Right arm, Left arm, Abdomen, Chest, Right upper leg, Left upper leg, Face, Front perineal area, Buttocks   Body parts bathed by helper: Left lower leg Body parts n/a: Right lower leg  Bathing assist Assist Level: Minimal Assistance - Patient > 75%     Upper Body Dressing/Undressing Upper body dressing Upper body dressing/undressing activity did not occur (including orthotics): Safety/medical concerns What is the patient wearing?: Pull over shirt    Upper body assist Assist Level: Set up assist    Lower Body Dressing/Undressing Lower body dressing      What is the patient wearing?: Pants, Underwear/pull up     Lower body assist Assist for lower body dressing: Minimal Assistance - Patient > 75%     Toileting Toileting    Toileting assist Assist for toileting: Contact Guard/Touching assist     Transfers Chair/bed transfer  Transfers assist     Chair/bed transfer assist level: Minimal Assistance - Patient > 75%     Locomotion Ambulation   Ambulation assist   Ambulation activity did not  occur: Safety/medical concerns  Assist level: Moderate Assistance - Patient 50 - 74% Assistive device: Walker-rolling Max distance: 20   Walk 10 feet activity   Assist  Walk 10 feet activity did not occur: Safety/medical concerns  Assist level: Moderate Assistance - Patient - 50 - 74% Assistive device: Walker-rolling   Walk 50 feet activity   Assist Walk 50 feet with 2 turns activity did not occur: Safety/medical concerns         Walk 150 feet activity   Assist Walk 150 feet activity did not occur: Safety/medical concerns         Walk 10 feet on uneven surface  activity   Assist Walk 10 feet on uneven surfaces activity did not occur: Safety/medical concerns         Wheelchair     Assist Is the patient using a wheelchair?: Yes Type of Wheelchair: Manual    Wheelchair assist level: Supervision/Verbal cueing Max wheelchair distance: 250    Wheelchair 50 feet with 2 turns activity    Assist        Assist Level: Supervision/Verbal cueing   Wheelchair 150 feet activity     Assist      Assist Level: Supervision/Verbal cueing   Blood pressure (!) 105/58, pulse (!) 107, temperature (!) 100.5 F (38.1 C), resp. rate 16, height 5\' 9"  (1.753 m), weight 73.1 kg, SpO2 97 %.  Medical Problem List and Plan: 1. Functional deficits secondary to recent right below the knee amputation secondary to critical limb ischemia, peripheral arterial disease, severe right lower extremity tibial artery disease.             -patient may shower but incision must be covered.              -ELOS/Goals: 1/18,   Supervision        con't CIR- PT and OT- pt says cannot get ramp by 1/18 and needs to extend stay- will d/w team at team conference today  1/18- d/c moved to 1/23- con't CIR- PT and OT 2.  Antithrombotics: -DVT/anticoagulation:  Mechanical: Sequential compression devices, below knee Left lower extremity Pharmaceutical: Other (comment) apixaban              -antiplatelet therapy: aspirin  3. Pain Management:  .  1/5-changed Norco to Oxycodone 5-10 mg q4 hours prn- s  1/13- pain seems controlled. Have discussed massage, vision rx   -he's tolerating shrinker 11/17- only taking 1-2x/day- con't regimen 4. Mood: LCSW to evaluate and provide emotional support             -antipsychotic agents: Not applicable 5. Neuropsych: This patient  is capable of making decisions on his own behalf. 6. Skin/Wound Care: tolerating shrinker ,using limb guard  -continue shrinker, +dressing if needed as he has occasional drainage No drainage, + edema, ecchymosis unchanged   1/17- more bruising/ecchymoses under patella, but incision looks good-  7. Fluids/Electrolytes/Nutrition: Fluid restriction, monitor intake and daily weight 8.  End-stage renal disease on chronic intermittent hemodialysis.  T/T/S -HD at end of day to avoid conflicting with therapy -volume mgt per nephrology team, has had issues with hypotension a few times after HD  1/17- HD today- after therapy.  9: Atrial fibrillation: Continue Lopressor, Eliquis  1/17- rate controlled- con't regimen 10: Hyperlipidemia: Continue Lipitor 11: COPD/asthma: albuterol neb PRN  -1/13 pO2 61 on ABG yesterday   -will need to go home on oxygen   -apparently has some equipment per above  1/16- Is on Home O2- will need to go home on Home O2- concerned about documentation of O2 sats of 60% this afternoon- on RA_ asked nursing to go eval pt and make sure if it's correct or not- and call me back if any issues  1/17- O2 drop yesterday was due to cold hands- sats in 90s- rewrote orders for home O2.  12: Renal osteodystrophy: Renvela and Sensipar as per nephrology 13: Barrett's Esophagus: Continue PPI 14: Anemia, iron deficiency/chronic disease: Aransep and Venofer per nephrology. 15: Peripheral arterial disease s/p right BKA on 12/30.  --Was discussed with Dr. Delana Meyer (on call vascular surgeon for Dr. Lucky Cowboy) regarding  antiplatelet therapy>>recommends aspirin 81 mg daily --Follow-up with Dr. Lucky Cowboy as outpatient  81. Depression 1/5- started Zoloft 50 mg daily for reactive depression 1/17- much brighter affect, but did mention will NOT go through another amputation.  17. Low grade temp-   1/18- Labs look ok, no leukocytosis 18. Mottling of L foot due to PAD  1/18- vascualr taking for arteriogram Friday- on Heparin gtt- cannot do tx dose lovenox due to ESRD 19. Hypotension  1/18- asking Renal if can do midodrine?   I spent a total of 54 minutes on total care- >50% coordination of care- going to see pt again during rapid response; calling PA to discuss need for midodrine possibly and for labs as discussed above- also call for vascular- to do arteriogram Friday per notes.       LOS: 14 days A FACE TO FACE EVALUATION WAS PERFORMED  Scott Castro 08/26/2021, 3:44 PM

## 2021-08-26 NOTE — Progress Notes (Addendum)
Patient ID: Scott Castro, male   DOB: 1956/06/26, 66 y.o.   MRN: [id number] Met with pt who has a friend present to discuss ramp and he reports he has a guy who will do his ramp along with making the inside handicapped accessible. He has asked to move OP-HD to [facility] and Scott Castro navigator is working on this, trying to get him into [place]. Will work on transportation resources and have had him sign the [employer] so he can utilize this for appointments. He reports having angiogram on Friday and along with his cousin and her husband coming in for therapies that day. Work on discharge needs.

## 2021-08-27 LAB — CBC
HCT: 25.9 % — ABNORMAL LOW (ref 39.0–52.0)
HCT: 24 % — ABNORMAL LOW (ref 39.0–52.0)
Hemoglobin: 7.9 g/dL — ABNORMAL LOW (ref 13.0–17.0)
Hemoglobin: 7.5 g/dL — ABNORMAL LOW (ref 13.0–17.0)
MCH: 29.6 pg (ref 26.0–34.0)
MCH: 30.9 pg (ref 26.0–34.0)
MCHC: 30.5 g/dL (ref 30.0–36.0)
MCHC: 31.3 g/dL (ref 30.0–36.0)
MCV: 97 fL (ref 80.0–100.0)
MCV: 98.8 fL (ref 80.0–100.0)
Platelets: 332 10*3/uL (ref 150–400)
Platelets: 368 10*3/uL (ref 150–400)
RBC: 2.67 MIL/uL — ABNORMAL LOW (ref 4.22–5.81)
RBC: 2.43 MIL/uL — ABNORMAL LOW (ref 4.22–5.81)
RDW: 16 % — ABNORMAL HIGH (ref 11.5–15.5)
RDW: 16 % — ABNORMAL HIGH (ref 11.5–15.5)
WBC: 6.1 10*3/uL (ref 4.0–10.5)
WBC: 6.8 10*3/uL (ref 4.0–10.5)
nRBC: 0 % (ref 0.0–0.2)
nRBC: 0 % (ref 0.0–0.2)

## 2021-08-27 LAB — RENAL FUNCTION PANEL
Albumin: 1.8 g/dL — ABNORMAL LOW (ref 3.5–5.0)
Anion gap: 13 (ref 5–15)
BUN: 49 mg/dL — ABNORMAL HIGH (ref 8–23)
CO2: 23 mmol/L (ref 22–32)
Calcium: 7.2 mg/dL — ABNORMAL LOW (ref 8.9–10.3)
Chloride: 92 mmol/L — ABNORMAL LOW (ref 98–111)
Creatinine, Ser: 6.45 mg/dL — ABNORMAL HIGH (ref 0.61–1.24)
GFR, Estimated: 9 mL/min — ABNORMAL LOW (ref >60)
Glucose, Bld: 104 mg/dL — ABNORMAL HIGH (ref 70–99)
Phosphorus: 3.5 mg/dL (ref 2.5–4.6)
Potassium: 4.1 mmol/L (ref 3.5–5.1)
Sodium: 128 mmol/L — ABNORMAL LOW (ref 135–145)

## 2021-08-27 LAB — PREPARE RBC (CROSSMATCH)

## 2021-08-27 LAB — HEPARIN LEVEL (UNFRACTIONATED): Heparin Unfractionated: >1.1 IU/mL — ABNORMAL HIGH (ref 0.30–0.70)

## 2021-08-27 LAB — APTT
aPTT: 49 seconds — ABNORMAL HIGH (ref 24–36)
aPTT: 46 seconds — ABNORMAL HIGH (ref 24–36)

## 2021-08-27 MED ORDER — SODIUM CHLORIDE 0.9% IV SOLUTION: Freq: Once | INTRAVENOUS | Status: DC

## 2021-08-27 NOTE — Progress Notes (Signed)
[place] for apixaban>>heparin Indication: atrial fibrillation  No Known Allergies  Patient Measurements: Height: 5\' 9"  (175.3 cm) Weight: 73.1 kg (161 lb 2.5 oz) IBW/kg (Calculated) : 70.7 Heparin Dosing Weight: 73kg  Vital Signs: Temp: 97.6 F (36.4 C) (01/19 1529) Temp Source: Temporal (01/19 1529) BP: 117/69 (01/19 1530) Pulse Rate: 113 (01/19 1530)  Labs: Recent Labs    08/25/21 1639 08/26/21 1004 08/27/21 0515 08/27/21 1514  HGB 7.4* 7.2* 7.9*  --   HCT 23.8* 23.1* 25.9*  --   PLT 346 276 332  --   APTT  --   --  49* 46*  HEPARINUNFRC  --   --  >1.10*  --   CREATININE 7.80* 4.82*  --   --      Estimated Creatinine Clearance: 15.1 mL/min (A) (by C-G formula based on SCr of 4.82 mg/dL (H)).   Medical History: Past Medical History:  Diagnosis Date   Barrett's esophagus    Chronic kidney disease    CHRONIC\   Glomerulosclerosis, focal    Hyperparathyroidism due to renal insufficiency (HCC)     Medications:  Medications Prior to Admission  Medication Sig Dispense Refill Last Dose   albuterol (VENTOLIN HFA) 108 (90 Base) MCG/ACT inhaler Inhale 2 puffs into the lungs every 6 (six) hours as needed for wheezing or shortness of breath.      apixaban (ELIQUIS) 5 MG TABS tablet Take 1 tablet (5 mg total) by mouth 2 (two) times daily. 60 tablet 0    atorvastatin (LIPITOR) 10 MG tablet Take 1 tablet (10 mg total) by mouth at bedtime. 30 tablet 0    cinacalcet (SENSIPAR) 60 MG tablet Take 60 mg by mouth daily.      diclofenac Sodium (VOLTAREN) 1 % GEL Apply 2 g topically 4 (four) times daily. Use on feet      diltiazem (CARDIZEM CD) 120 MG 24 hr capsule Take 1 capsule (120 mg total) by mouth daily. Hold for sbp < 100      feeding supplement (ENSURE ENLIVE / ENSURE PLUS) LIQD Take 237 mLs by mouth 2 (two) times daily between meals. 237 mL 12    gabapentin (NEURONTIN) 100 MG capsule Take 1 capsule (100 mg total) by mouth 2 (two)  times daily.      HYDROcodone-acetaminophen (NORCO) 7.5-325 MG tablet Take 1 tablet by mouth 3 (three) times daily as needed.      methocarbamol (ROBAXIN) 500 MG tablet Take 1 tablet (500 mg total) by mouth every 8 (eight) hours as needed for muscle spasms.      metoprolol tartrate (LOPRESSOR) 25 MG tablet Take 0.5 tablets (12.5 mg total) by mouth 2 (two) times daily.      pantoprazole (PROTONIX) 40 MG tablet Take 40 mg by mouth daily.      polyethylene glycol (MIRALAX / GLYCOLAX) 17 g packet Take 17 g by mouth daily as needed. 14 each 0    senna-docusate (SENOKOT-S) 8.6-50 MG tablet Take 2 tablets by mouth 2 (two) times daily.      sevelamer carbonate (RENVELA) 800 MG tablet Take by mouth See admin instructions. Take 2400 mg by mouth three times daily, 1600 mg with snacks      Scheduled:   sodium chloride   Intravenous Once   aspirin EC  81 mg Oral Daily   atorvastatin  10 mg Oral QHS   cinacalcet  60 mg Oral Q supper   Darbepoetin Alfa  60 mcg Intravenous Q  Tue-HD   diltiazem  120 mg Oral Daily   gabapentin  100 mg Oral BID   hydrocortisone  25 mg Rectal BID   metoprolol tartrate  12.5 mg Oral BID   midodrine  10 mg Oral TID WC   pantoprazole  40 mg Oral Daily   senna-docusate  2 tablet Oral BID   sertraline  100 mg Oral Daily   sevelamer carbonate  2.4 g Oral TID WC   Infusions:   ferric gluconate (FERRLECIT) IVPB 125 mg (08/22/21 2111)   heparin 1,300 Units/hr (08/27/21 0654)    Assessment: Pt recent got right BKA for PAD. He has been on apixaban her for his AF. Pt will left angiogram now so apixaban was ordered to be changed to IV heparin. His last dose was on 1/18.   Initial HL and aPTT do not correlate due to apixaban effect. aPTT is still subtherapeutic this PM. We will increase the rate and check again.    Scr - ESRD  Goal of Therapy:  Heparin level 0.3-0.7 units/ml PTT 66-102 Monitor platelets by anticoagulation protocol: Yes   Plan:  Increase heparin infusion to  1500 units/hr 8 hr PTT  Daily PTT and HL, CBC  Onnie Boer, PharmD, BCIDP, AAHIVP, CPP Infectious Disease Pharmacist 08/27/2021 3:48 PM

## 2021-08-27 NOTE — Progress Notes (Incomplete Revision)
Physical Therapy Weekly Progress Note ° °Patient Details  °Name: Scott Castro °MRN: [id number] °Date of Birth: 11/06/1955 ° °Beginning of progress report period: August 20, 2021 °End of progress report period: August 27, 2021 ° °Today's Date: 08/27/2021 °PT Individual Time: 0800-0912, 1114-1158 °PT Individual Time Calculation (min): 72 min, 44 min  ° °Patient has met 1 of 1 short term goals.  Pt able to achieve CGA overall inconsistently.  ° °Patient continues to demonstrate the following deficits muscle weakness, decreased cardiorespiratoy endurance and decreased oxygen support, and decreased standing balance and decreased balance strategies and therefore will continue to benefit from skilled PT intervention to increase functional independence with mobility. ° °Patient not progressing toward long term goals.  See goal revision..  Plan of care revisions: d/c stair goal.  ° °PT Short Term Goals °Week 2:  PT Short Term Goal 1 (Week 2): =LTGs d/t ELOS °Week 3:  PT Short Term Goal 1 (Week 3): =LTGs d/t extended LOS ° °Skilled Therapeutic Interventions/Progress Updates:  °  Session 1: °pt received in bed and agreeable to therapy. Pain well controlled on medication this AM. Nsg in/out for meds pass. Donned shrinker tock tot A  for time and limb guard min A while sitting EOB. Bed mobility supervision. Stand pivot transfer with min A VC for technique and AD use. Pt maintained on 2L portable O2 and IV line, therapist managing through out. Session focused on w/c mobility and endurance while monitoring vital signs  with continuous monitor. O2 dropped at times during session, but rose quickly with seated rest or when directed to stop talking. Pt hyperverbal and perseverating on his condition/the possibility of LLE amputation. Pt propelled w/c through unit, >300 ft, to retrieve and adjust elevating leg rest. Continued education on importance of elevating LLE to mitigate vascular symptoms. Pt continued to propel chair with rest  breaks as need to maintain O2 levels. Pt returned to room and was handed off for nursing care at end of session.  ° °Session 2: °Pt seated in w/c on arrival and agreeable to therapy. Pt reports 2/10 pain in the residual limb and , premedicated. Pt Rest and positioning provided as needed. Maintained on wall O2 and IV, continuous O2 monitoring throughout. O2 largely stable throughout, with occ that rose quickly. Session focused on activity tolerance and balance using game of horseshoes for improved participation. First round retrieved from IV pole for balance challenge with OH reaching. Progressed to standing between each toss for incr difficulty for activity tolerance. Pt able to stand with CGA~ 80% of attempts, with failed attempts and min A other attempts. One posterior LOB with uncontrolled sitting. Discussed corrections to technique. Pt remained in w/c at end of session and was left with all needs in reach and alarm active.  ° °Therapy Documentation °Precautions:  °Precautions °Precautions: Fall °Restrictions °Weight Bearing Restrictions: Yes °RLE Weight Bearing: Non weight bearing °Other Position/Activity Restrictions: s/p R BKA ° ° °Therapy/Group: Individual Therapy ° °[name] °08/27/2021, 10:46 AM  °

## 2021-08-27 NOTE — Progress Notes (Signed)
[place] for apixaban>>heparin Indication: atrial fibrillation  No Known Allergies  Patient Measurements: Height: 5\' 9"  (175.3 cm) Weight: 73.1 kg (161 lb 2.5 oz) IBW/kg (Calculated) : 70.7 Heparin Dosing Weight: 73kg  Vital Signs: Temp: 98.5 F (36.9 C) (01/19 0407) Temp Source: Oral (01/19 0407) BP: 91/68 (01/19 0407) Pulse Rate: 125 (01/19 0407)  Labs: Recent Labs    08/25/21 1639 08/26/21 1004 08/27/21 0515  HGB 7.4* 7.2* 7.9*  HCT 23.8* 23.1* 25.9*  PLT 346 276 332  APTT  --   --  49*  HEPARINUNFRC  --   --  >1.10*  CREATININE 7.80* 4.82*  --      Estimated Creatinine Clearance: 15.1 mL/min (A) (by C-G formula based on SCr of 4.82 mg/dL (H)).   Medical History: Past Medical History:  Diagnosis Date   Barrett's esophagus    Chronic kidney disease    CHRONIC\   Glomerulosclerosis, focal    Hyperparathyroidism due to renal insufficiency (HCC)     Medications:  Medications Prior to Admission  Medication Sig Dispense Refill Last Dose   albuterol (VENTOLIN HFA) 108 (90 Base) MCG/ACT inhaler Inhale 2 puffs into the lungs every 6 (six) hours as needed for wheezing or shortness of breath.      apixaban (ELIQUIS) 5 MG TABS tablet Take 1 tablet (5 mg total) by mouth 2 (two) times daily. 60 tablet 0    atorvastatin (LIPITOR) 10 MG tablet Take 1 tablet (10 mg total) by mouth at bedtime. 30 tablet 0    cinacalcet (SENSIPAR) 60 MG tablet Take 60 mg by mouth daily.      diclofenac Sodium (VOLTAREN) 1 % GEL Apply 2 g topically 4 (four) times daily. Use on feet      diltiazem (CARDIZEM CD) 120 MG 24 hr capsule Take 1 capsule (120 mg total) by mouth daily. Hold for sbp < 100      feeding supplement (ENSURE ENLIVE / ENSURE PLUS) LIQD Take 237 mLs by mouth 2 (two) times daily between meals. 237 mL 12    gabapentin (NEURONTIN) 100 MG capsule Take 1 capsule (100 mg total) by mouth 2 (two) times daily.      HYDROcodone-acetaminophen  (NORCO) 7.5-325 MG tablet Take 1 tablet by mouth 3 (three) times daily as needed.      methocarbamol (ROBAXIN) 500 MG tablet Take 1 tablet (500 mg total) by mouth every 8 (eight) hours as needed for muscle spasms.      metoprolol tartrate (LOPRESSOR) 25 MG tablet Take 0.5 tablets (12.5 mg total) by mouth 2 (two) times daily.      pantoprazole (PROTONIX) 40 MG tablet Take 40 mg by mouth daily.      polyethylene glycol (MIRALAX / GLYCOLAX) 17 g packet Take 17 g by mouth daily as needed. 14 each 0    senna-docusate (SENOKOT-S) 8.6-50 MG tablet Take 2 tablets by mouth 2 (two) times daily.      sevelamer carbonate (RENVELA) 800 MG tablet Take by mouth See admin instructions. Take 2400 mg by mouth three times daily, 1600 mg with snacks      Scheduled:   aspirin EC  81 mg Oral Daily   atorvastatin  10 mg Oral QHS   cinacalcet  60 mg Oral Q supper   Darbepoetin Alfa  60 mcg Intravenous Q Tue-HD   diltiazem  120 mg Oral Daily   gabapentin  100 mg Oral BID   hydrocortisone  25 mg Rectal BID  metoprolol tartrate  12.5 mg Oral BID   midodrine  10 mg Oral TID WC   pantoprazole  40 mg Oral Daily   senna-docusate  2 tablet Oral BID   sertraline  100 mg Oral Daily   sevelamer carbonate  2.4 g Oral TID WC   Infusions:   ferric gluconate (FERRLECIT) IVPB 125 mg (08/22/21 2111)   heparin 1,100 Units/hr (08/26/21 2119)    Assessment: Pt recent got right BKA for PAD. He has been on apixaban her for his AF. Pt will left angiogram now so apixaban was ordered to be changed to IV heparin. His last dose was on 1/18.   Initial HL and aPTT do not correlate due to apixaban effect. aPTT is subtherapeutic on 1100 units/hr. H/H low stable, Plt wnl. RN reports no s/s of bleeding   Scr - ESRD  Goal of Therapy:  Heparin level 0.3-0.7 units/ml PTT 66-102 Monitor platelets by anticoagulation protocol: Yes   Plan:  Increase heparin infusion to 1300 units/hr 8 hr PTT this afternoon  Daily PTT and HL,  CBC  Albertina Parr, PharmD., BCPS, BCCCP Clinical Pharmacist Please refer to [facility] for unit-specific pharmacist

## 2021-08-27 NOTE — Progress Notes (Addendum)
Physical Therapy Weekly Progress Note  Patient Details  Name: Scott Castro MRN: [id number] Date of Birth: July 28, 1956  Beginning of progress report period: August 20, 2021 End of progress report period: August 27, 2021  Today's Date: 08/27/2021 PT Individual Time: 6301-6010, 9323-5573 PT Individual Time Calculation (min): 72 min, 44 min   Patient has met 1 of 1 short term goals.  Pt able to achieve CGA overall inconsistently.   Patient continues to demonstrate the following deficits muscle weakness, decreased cardiorespiratoy endurance and decreased oxygen support, and decreased standing balance and decreased balance strategies and therefore will continue to benefit from skilled PT intervention to increase functional independence with mobility.  Patient not progressing toward long term goals.  See goal revision..  Plan of care revisions: d/c stair goal.   PT Short Term Goals Week 2:  PT Short Term Goal 1 (Week 2): =LTGs d/t ELOS Week 3:  PT Short Term Goal 1 (Week 3): =LTGs d/t extended LOS  Skilled Therapeutic Interventions/Progress Updates:    Session 1: pt received in bed and agreeable to therapy. Pain well controlled on medication this AM. Nsg in/out for meds pass. Donned shrinker tock tot A  for time and limb guard min A while sitting EOB. Bed mobility supervision. Stand pivot transfer with min A VC for technique and AD use. Pt maintained on 2L portable O2 and IV line, therapist managing through out. Session focused on w/c mobility and endurance while monitoring vital signs  with continuous monitor. O2 dropped at times during session, but rose quickly with seated rest or when directed to stop talking. Pt hyperverbal and perseverating on his condition/the possibility of LLE amputation. Pt propelled w/c through unit, >300 ft, to retrieve and adjust elevating leg rest. Continued education on importance of elevating LLE to mitigate vascular symptoms. Pt continued to propel chair with rest  breaks as need to maintain O2 levels. Pt returned to room and was handed off for nursing care at end of session.   Session 2: Pt seated in w/c on arrival and agreeable to therapy. Pt reports 2/10 pain in the residual limb and , premedicated. Pt Rest and positioning provided as needed. Maintained on wall O2 and IV, continuous O2 monitoring throughout. O2 largely stable throughout, with occ that rose quickly. Session focused on activity tolerance and balance using game of horseshoes for improved participation. First round retrieved from IV pole for balance challenge with OH reaching. Progressed to standing between each toss for incr difficulty for activity tolerance. Pt able to stand with CGA~ 80% of attempts, with failed attempts and min A other attempts. One posterior LOB with uncontrolled sitting. Discussed corrections to technique. Pt remained in w/c at end of session and was left with all needs in reach and alarm active.   Therapy Documentation Precautions:  Precautions Precautions: Fall Restrictions Weight Bearing Restrictions: Yes RLE Weight Bearing: Non weight bearing Other Position/Activity Restrictions: s/p R BKA   Therapy/Group: Individual Therapy  [name] 08/27/2021, 10:46 AM

## 2021-08-27 NOTE — Progress Notes (Signed)
[facility] Progress Note   66 y.o. male PMH COPD Barrett's, hyperparathyroidism, ESRD, failed renal transplant, PAF on Eliquis p/w ischemic foot ulcer -> BKA on 12/30.  Patient receives his dialysis treatments at [place] with [facility].   Dialysis at [facility] with [facility] EDW 68.4kg? EDW before the amputation was 72kg   Elisio 16  Assessment/ Plan:   ESRD - TTS at [facility] w/ [facility] - establishing new EDW  - back on TTS schedule - next HD today - BP on low side/ HR , try UF 1-1.5L as tolerated, tolerated 1L last tx. PO intake isn't robust.  [name]- last Phos 1/1 was 5; 1/16 phos 3.4 [name] 3 tabs TIDM. [name] 60mg  qdaily. Anemia - will dose ESA on Aranesp 26mcg qTues - ^ dose; 10% sats on 1/1--> getting IV iron load with HD. Hbg 7s. In setting of worsening hemodynamics will give 1u pRBC with HD today.  PAD with critical limb ischemia s/p rt BKA for gangrene - now with new L foot change and plans for eval prior to d/c VVS planning angio tomorrow.  PAF - rate controlled on Eliquis typically. On dilt and metop - HR into 130s this AM, give both meds now.  If ongoing issues with hypotension and RVR will need to involve cardiology.    GERD COPD 8.  Hypotension: started midodrine 1/18.  Infection eval per primary.  Asymptomatic.    9. Dispo: private transport to [facility] unit from his [place] home too costly, asked to transfer to [employer] - notified SW of change- working on arrangements  Subjective:    Low BP this AM - he feels fine.  HR into 130s this AM. L foot worse yet today - VVS going to do angiogram tomorrow   BP 91/68 (BP Location: Right Arm)    Pulse (!) 125    Temp 98.5 F (36.9 C) (Oral)    Resp 18    Ht 5\' 9"  (1.753 m)    Wt 73.1 kg    SpO2 100%    BMI 23.80 kg/m   Intake/Output Summary (Last 24 hours) at 08/27/2021 0928 Last data filed at 08/27/2021 0825 Gross per 24 hour  Intake 480 ml  Output --  Net 480 ml     Weight change: -0.7 kg  Physical Exam: GEN: NAD, A&Ox3, NCAT HEENT: MMM LUNGS: CTA B/L no rales, rhonchi or wheezing CV: irregular irregular tachycardic, crescendo murmur ABD: SNDNT +BS  EXT:R BKA, pic of L foot in VVS note. Trace L ankle edema ACCESS: Lt BBT--> distal portion is aneurysmal +t/b  Imaging: No results found.  Labs: BMET Recent Labs  Lab 08/20/21 1312 08/25/21 1639 08/26/21 1004  NA 132* 126* 131*  K 3.8 4.2 3.6  CL 94* 93* 95*  CO2 26 24 27   GLUCOSE 115* 112* 134*  BUN 38* 56* 31*  CREATININE 6.16* 7.80* 4.82*  CALCIUM 7.5* 6.4* 7.3*  PHOS 3.4  --  2.7    CBC Recent Labs  Lab 08/20/21 1312 08/25/21 1639 08/26/21 1004 08/27/21 0515  WBC 9.4 7.0 7.5 6.1  NEUTROABS  --   --  5.7  --   HGB 7.7* 7.4* 7.2* 7.9*  HCT 25.8* 23.8* 23.1* 25.9*  MCV 99.6 99.2 99.1 97.0  PLT 430* 346 276 332     Medications:     aspirin EC  81 mg Oral Daily   atorvastatin  10 mg Oral QHS   cinacalcet  60 mg Oral Q supper   Darbepoetin Alfa  60 mcg Intravenous Q Tue-HD   diltiazem  120 mg Oral Daily   gabapentin  100 mg Oral BID   hydrocortisone  25 mg Rectal BID   metoprolol tartrate  12.5 mg Oral BID   midodrine  10 mg Oral TID WC   pantoprazole  40 mg Oral Daily   senna-docusate  2 tablet Oral BID   sertraline  100 mg Oral Daily   sevelamer carbonate  2.4 g Oral TID WC    Jannifer Hick MD [facility] Pager [phone]

## 2021-08-27 NOTE — Progress Notes (Signed)
[facility] [place] PROGRESS NOTE  ASSESSMENT / PLAN: Scott Castro is a 66 y.o. male with atherosclerosis of native arteries of left lower extremity causing ischemic rest pain and ischemic skin changes.  Recommend the following which can slow the progression of atherosclerosis and reduce the risk of major adverse cardiac / limb events:  Complete cessation from all tobacco products. Blood glucose control with goal A1c < 7%. Blood pressure control with goal blood pressure < 140/90 mmHg. Lipid reduction therapy with goal LDL-C <100 mg/dL (<70 if symptomatic from PAD).  Aspirin 81mg  PO QD.  Atorvastatin 40-80mg  PO QD (or other "high intensity" statin therapy).  Plan left lower extremity angiogram with possible intervention via right common femoral artery approach in cath lab 08/28/21.   SUBJECTIVE: No complaints. Understanding of plan.   OBJECTIVE: BP 103/64 (BP Location: Right Arm)    Pulse (!) 115    Temp 97.9 F (36.6 C)    Resp 17    Ht 5\' 9"  (1.753 m)    Wt 73.1 kg    SpO2 100%    BMI 23.80 kg/m   Intake/Output Summary (Last 24 hours) at 08/27/2021 1344 Last data filed at 08/27/2021 1300 Gross per 24 hour  Intake 440 ml  Output --  Net 440 ml    Constitutional: chronically ill appearing. no acute distress. Cardiac: RRR. Pulmonary: unlabored Abdomen: soft, not tender. Vascular: unchanged appearance of left foot; doppler flow in foot.  CBC Latest Ref Rng & Units 08/27/2021 08/26/2021 08/25/2021  WBC 4.0 - 10.5 K/uL 6.1 7.5 7.0  Hemoglobin 13.0 - 17.0 g/dL 7.9(L) 7.2(L) 7.4(L)  Hematocrit 39.0 - 52.0 % 25.9(L) 23.1(L) 23.8(L)  Platelets 150 - 400 K/uL 332 276 346     CMP Latest Ref Rng & Units 08/26/2021 08/25/2021 08/20/2021  Glucose 70 - 99 mg/dL 134(H) 112(H) 115(H)  BUN 8 - 23 mg/dL 31(H) 56(H) 38(H)  Creatinine 0.61 - 1.24 mg/dL 4.82(H) 7.80(H) 6.16(H)  Sodium 135 - 145 mmol/L 131(L) 126(L) 132(L)  Potassium 3.5 - 5.1 mmol/L 3.6 4.2 3.8  Chloride 98  - 111 mmol/L 95(L) 93(L) 94(L)  CO2 22 - 32 mmol/L 27 24 26   Calcium 8.9 - 10.3 mg/dL 7.3(L) 6.4(LL) 7.5(L)  Total Protein 6.5 - 8.1 g/dL - - -  Total Bilirubin 0.3 - 1.2 mg/dL - - -  Alkaline Phos 38 - 126 U/L - - -  AST 15 - 41 U/L - - -  ALT 0 - 44 U/L - - -    Estimated Creatinine Clearance: 15.1 mL/min (A) (by C-G formula based on SCr of 4.82 mg/dL (H)).  Scott Castro. Scott Breed, MD [facility] Phone Number: [phone] 08/27/2021 1:44 PM

## 2021-08-27 NOTE — Progress Notes (Signed)
PROGRESS NOTE   Subjective/Complaints:  Pt reports very scared will need another amputation of LLE- said "it came on so fast".  Per staff, once I left room, pt crying- hard.   Per staff, getting a lot of new sloughing skin/skin tears on R BKA and LLE.   Will hold BP meds except metoprolol- due to BP still being low 97C-16L systolic this AM.      ROS:    Pt denies SOB, abd pain, CP, N/V/C/D, and vision changes Very depressed and scared; HR elevated  Objective:   No results found. Recent Labs    08/26/21 1004 08/27/21 0515  WBC 7.5 6.1  HGB 7.2* 7.9*  HCT 23.1* 25.9*  PLT 276 332    Recent Labs    08/25/21 1639 08/26/21 1004  NA 126* 131*  K 4.2 3.6  CL 93* 95*  CO2 24 27  GLUCOSE 112* 134*  BUN 56* 31*  CREATININE 7.80* 4.82*  CALCIUM 6.4* 7.3*     Intake/Output Summary (Last 24 hours) at 08/27/2021 0859 Last data filed at 08/27/2021 0825 Gross per 24 hour  Intake 480 ml  Output --  Net 480 ml     Pressure Injury 08/26/21 Heel Right Deep Tissue Pressure Injury - Purple or maroon localized area of discolored intact skin or blood-filled blister due to damage of underlying soft tissue from pressure and/or shear. (Active)  08/26/21 1826  Location: Heel  Location Orientation: Right  Staging: Deep Tissue Pressure Injury - Purple or maroon localized area of discolored intact skin or blood-filled blister due to damage of underlying soft tissue from pressure and/or shear.  Wound Description (Comments):   Present on Admission: No    Physical Exam: Vital Signs Blood pressure 91/68, pulse (!) 125, temperature 98.5 F (36.9 C), temperature source Oral, resp. rate 18, height 5\' 9"  (1.753 m), weight 73.1 kg, SpO2 100 %.        General: awake, alert, appropriate, NAD HENT: conjugate gaze; oropharynx moist CV: tachycardic; irregular rhythm;  no JVD Pulmonary: CTA B/L; no W/R/R- good air movement GI:  soft, NT, ND, (+)BS Psychiatric: very depressed; tearful Neurological: Ox3  Skin: mottled appearance of L foot from arch to toes and also on bottom of foot to base of MTPs -looks much worse today with sloughing of skin- even on R BKA- above incision. -doesn't appear to be just skin tear.          Musculoskeletal:  LLE a few muscle spasms seen- not spasticity based on no increased tone  Extremities- LLE more swollen this AM- likely from keeping off side of bed/vascular changes      Assessment/Plan: 1. Functional deficits which require 3+ hours per day of interdisciplinary therapy in a comprehensive inpatient rehab setting. Physiatrist is providing close team supervision and 24 hour management of active medical problems listed below. Physiatrist and rehab team continue to assess barriers to discharge/monitor patient progress toward functional and medical goals  Care Tool:  Bathing  Bathing activity did not occur: Safety/medical concerns Body parts bathed by patient: Right arm, Left arm, Abdomen, Chest, Right upper leg, Left upper leg, Face, Front perineal area, Buttocks   Body parts  bathed by helper: Left lower leg Body parts n/a: Right lower leg   Bathing assist Assist Level: Minimal Assistance - Patient > 75%     Upper Body Dressing/Undressing Upper body dressing Upper body dressing/undressing activity did not occur (including orthotics): Safety/medical concerns What is the patient wearing?: Pull over shirt    Upper body assist Assist Level: Set up assist    Lower Body Dressing/Undressing Lower body dressing      What is the patient wearing?: Pants, Underwear/pull up     Lower body assist Assist for lower body dressing: Minimal Assistance - Patient > 75%     Toileting Toileting    Toileting assist Assist for toileting: Contact Guard/Touching assist     Transfers Chair/bed transfer  Transfers assist     Chair/bed transfer assist level: Minimal  Assistance - Patient > 75%     Locomotion Ambulation   Ambulation assist   Ambulation activity did not occur: Safety/medical concerns  Assist level: Moderate Assistance - Patient 50 - 74% Assistive device: Walker-rolling Max distance: 20   Walk 10 feet activity   Assist  Walk 10 feet activity did not occur: Safety/medical concerns  Assist level: Moderate Assistance - Patient - 50 - 74% Assistive device: Walker-rolling   Walk 50 feet activity   Assist Walk 50 feet with 2 turns activity did not occur: Safety/medical concerns         Walk 150 feet activity   Assist Walk 150 feet activity did not occur: Safety/medical concerns         Walk 10 feet on uneven surface  activity   Assist Walk 10 feet on uneven surfaces activity did not occur: Safety/medical concerns         Wheelchair     Assist Is the patient using a wheelchair?: Yes Type of Wheelchair: Manual    Wheelchair assist level: Supervision/Verbal cueing Max wheelchair distance: 250    Wheelchair 50 feet with 2 turns activity    Assist        Assist Level: Supervision/Verbal cueing   Wheelchair 150 feet activity     Assist      Assist Level: Supervision/Verbal cueing   Blood pressure 91/68, pulse (!) 125, temperature 98.5 F (36.9 C), temperature source Oral, resp. rate 18, height 5\' 9"  (1.753 m), weight 73.1 kg, SpO2 100 %.  Medical Problem List and Plan: 1. Functional deficits secondary to recent right below the knee amputation secondary to critical limb ischemia, peripheral arterial disease, severe right lower extremity tibial artery disease.             -patient may shower but incision must be covered.              -ELOS/Goals: 1/18,   Supervision        con't CIR- PT and OT- pt says cannot get ramp by 1/18 and needs to extend stay- will d/w team at team conference today  1/19- Con't CIR- PT, OT- however has arteriogram tomorrow- isn't diabetic, so doesn't need  D5NS/since will be NPO.  2.  Antithrombotics: -DVT/anticoagulation:  Mechanical: Sequential compression devices, below knee Left lower extremity  1/19- on Heparin gtt- Eliquis held for arteriogram.  Pharmaceutical: Other (comment) apixaban             -antiplatelet therapy: aspirin  3. Pain Management:  .  1/5-changed Norco to Oxycodone 5-10 mg q4 hours prn- s  1/13- pain seems controlled. Have discussed massage, vision rx   -he's tolerating shrinker 11/17-  only taking 1-2x/day- con't regimen  1/19- having more pain- was resting LLE off bed- more swollen- asked him not to.  4. Mood: LCSW to evaluate and provide emotional support             -antipsychotic agents: Not applicable 5. Neuropsych: This patient is capable of making decisions on his own behalf. 6. Skin/Wound Care: tolerating shrinker ,using limb guard  -continue shrinker, +dressing if needed as he has occasional drainage No drainage, + edema, ecchymosis unchanged   1/19- R BKA having skin slough- also LLE looks worse- going for arteriogram tomorrow 7. Fluids/Electrolytes/Nutrition: Fluid restriction, monitor intake and daily weight 8.  End-stage renal disease on chronic intermittent hemodialysis.  T/T/S -HD at end of day to avoid conflicting with therapy -volume mgt per nephrology team, has had issues with hypotension a few times after HD 1/19- HD today- will see if can get transfusion of 1 unit- HR 120s- could be be made worse by anemia.  9: Atrial fibrillation: Continue Lopressor, Eliquis  1/17- rate controlled- con't regimen 10: Hyperlipidemia: Continue Lipitor 11: COPD/asthma: albuterol neb PRN  -1/13 pO2 61 on ABG yesterday   -will need to go home on oxygen   -apparently has some equipment per above  1/16- Is on Home O2- will need to go home on Home O2- concerned about documentation of O2 sats of 60% this afternoon- on RA_ asked nursing to go eval pt and make sure if it's correct or not- and call me back if any  issues  1/17- O2 drop yesterday was due to cold hands- sats in 90s- rewrote orders for home O2.  12: Renal osteodystrophy: Renvela and Sensipar as per nephrology 13: Barrett's Esophagus: Continue PPI 14: Anemia, iron deficiency/chronic disease: Aransep and Venofer per nephrology. 15: Peripheral arterial disease s/p right BKA on 12/30.  --Was discussed with Dr. Delana Meyer (on call vascular surgeon for Dr. Lucky Cowboy) regarding antiplatelet therapy>>recommends aspirin 81 mg daily --Follow-up with Dr. Lucky Cowboy as outpatient  30. Depression 1/5- started Zoloft 50 mg daily for reactive depression 1/17- much brighter affect, but did mention will NOT go through another amputation.   1/19- Tearful- increased Zoloft to 100 mg daily 17. Low grade temp-   1/18- Labs look ok, no leukocytosis 18. Mottling of L foot due to PAD  1/18- vascualr taking for arteriogram Friday- on Heparin gtt- cannot do tx dose lovenox due to ESRD  1/19- arteriogram tomorrow- looking worse daily-  19. Hypotension  1/18- asking Renal if can do midodrine? 1/19- midodrine started yesterday- 10 mg TID_ working better  I spent a total of 57 minutes on total care- >50% coordination of care- - d/w renal, PA, getting pics of pt; and d/w nursing and NT.  Asked nurse to give Diltiazem regardless of BP- think that's why HR has gotten worse.     LOS: 15 days A FACE TO FACE EVALUATION WAS PERFORMED  Scott Castro 08/27/2021, 8:59 AM

## 2021-08-27 NOTE — Progress Notes (Signed)
Occupational Therapy Session Note  Patient Details  Name: Scott Castro MRN: [id number] Date of Birth: November 28, 1955  Today's Date: 08/27/2021 OT Individual Time: 7948-0165 OT Individual Time Calculation (min): 73 min    Short Term Goals: Week 1:  OT Short Term Goal 1 (Week 1): Pt will complete stand pivot to toilet using RW with min A. OT Short Term Goal 1 - Progress (Week 1): Met OT Short Term Goal 2 (Week 1): Pt will  sit to stand to RW with S to prep for LB self care., OT Short Term Goal 2 - Progress (Week 1): Progressing toward goal OT Short Term Goal 3 (Week 1): Pt will stand with CGA as he manages clothing over hips during self care tasks. OT Short Term Goal 3 - Progress (Week 1): Met OT Short Term Goal 4 (Week 1): Pt will complete simulated tub bench transfer with CGA. OT Short Term Goal 4 - Progress (Week 1): Met Week 2:  OT Short Term Goal 1 (Week 2): STG = LTG at Mod I OT Short Term Goal 1 - Progress (Week 2): Progressing toward goal Week 3:  OT Short Term Goal 1 (Week 3): STG = LTG 2/2 LOS at Mod I   Skilled Therapeutic Interventions/Progress Updates:    Pt greeted at time of session semireclined bed level on O2 at 2L, remained on throughout session. IV running as well, OT assist with managing IV pole throughout mobility and session needing extended time for lines and leads. NT checking vitals at beginning of session, per NT vitals WNL. Supine > sit Supervision and squat pivot to wheelchair Supervision/CGA. Pt self propel to sink and performed UB bathing with set up, needing time for RN to thread/unthread IV through old shirt and into new one, otherwise pt is set up for UB dressing. Declined LB ADL this session. Extensive discussion during session regarding family ed, upcoming procedure tomorrow, limb loss education, and DC planning. Pt performing standing balance activity for 2 rounds for reaching behind self for item and aiming for target with horseshoes, CGA throughout. Pt up in  wheelchair at end of session IV plugged in to wall, O2 on 2L, and all needs met.   Therapy Documentation Precautions:  Precautions Precautions: Fall Restrictions Weight Bearing Restrictions: Yes RLE Weight Bearing: Non weight bearing Other Position/Activity Restrictions: s/p R BKA      Therapy/Group: Individual Therapy  [name] 08/27/2021, 7:26 AM

## 2021-08-27 NOTE — Progress Notes (Signed)
°   08/26/21 0803  Assess: MEWS Score  Temp 100 F (37.8 C)  BP (!) 72/50  Pulse Rate 98  Resp 16  SpO2 100 %  O2 Device Nasal Cannula  O2 Flow Rate (L/min) 2 L/min  Assess: MEWS Score  MEWS Temp 0  MEWS Systolic 2  MEWS Pulse 0  MEWS RR 0  MEWS LOC 0  MEWS Score 2  MEWS Score Color Yellow  Assess: if the MEWS score is Yellow or Red  Were vital signs taken at a resting state? Yes  Focused Assessment No change from prior assessment  Does the patient meet 2 or more of the SIRS criteria? No  Does the patient have a confirmed or suspected source of infection? No  Early Detection of Sepsis Score *See Row Information* Low  MEWS guidelines implemented *See Row Information* Yes  Treat  MEWS Interventions Administered scheduled meds/treatments  Pain Scale 0-10  Pain Score 8  Pain Type Acute pain  Pain Location Toe (Comment which one) (Great)  Pain Orientation Left  Pain Descriptors / Indicators Aching  Pain Frequency Intermittent  Pain Onset Gradual  Patients Stated Pain Goal 0  Pain Intervention(s) Medication (See eMAR);MD notified (Comment);Elevated extremity;Emotional support;Guided imagery;Repositioned  Multiple Pain Sites No  Breathing 0  Negative Vocalization 1  Facial Expression 1  Body Language 0  Consolability 1  PAINAD Score 3  Constipation interventions Stool Softener  Patients response to intervention Effective  Take Vital Signs  Increase Vital Sign Frequency  Yellow: Q 2hr X 2 then Q 4hr X 2, if remains yellow, continue Q 4hrs  Escalate  MEWS: Escalate Yellow: discuss with charge nurse/RN and consider discussing with provider and RRT  Notify: Charge Nurse/RN  Name of Charge Nurse/RN Notified Angelina  Date Charge Nurse/RN Notified 08/26/21  Time Charge Nurse/RN Notified [phone]  Notify: Provider  Provider Name/Title Courtney Heys  Date Provider Notified 08/26/21  Time Provider Notified [phone]  Notification Type Call  Notification Reason Change in status   Provider response At bedside;See new orders;In department  Date of Provider Response 08/26/21  Time of Provider Response 0804  Notify: Rapid Response  Name of Rapid Response RN Notified Kennis Carina  Date Rapid Response Notified 08/26/21  Time Rapid Response Notified 0804  Document  Patient Outcome Other (Comment) (Patient remained on unit, continued protocol.)  Progress note created (see row info) Yes

## 2021-08-27 NOTE — Progress Notes (Signed)
Physical Therapy Discharge Summary  Patient Details  Name: Scott Castro MRN: [id number] Date of Birth: 06/25/1956  Patient has met 0 of 4 long term goals due to improved activity tolerance, improved balance, and ability to compensate for deficits.  Patient to discharge at a wheelchair level Supervision.   Patient's care partner is independent to provide the necessary physical assistance at discharge.  Reasons goals not met: D/c stair goal as pt installed a ramp for home access. Pt did not meet other goals set at mod I level as he remains CGA overall. He also did not meet w/c mobility goal of mod I as he remained at Supervision level per last recorded instance of w/c mobility performed.  Recommendation:  Patient will benefit from ongoing skilled PT services in  acute care setting  to continue to advance safe functional mobility, address ongoing impairments in balance, weakness, and minimize fall risk.  Equipment: 16x 18 w/c   Reasons for discharge: change in medical status, lack of progress toward goals, and discharge back to acute care for further cardiac monitoring and LLE BKA.  Patient/family agrees with progress made and goals achieved: Yes  PT Discharge Precautions/Restrictions Precautions Precautions: Fall Restrictions Weight Bearing Restrictions: Yes RLE Weight Bearing: Non weight bearing Pain Interference Pain Interference Pain Effect on Sleep: 1. Rarely or not at all Pain Interference with Therapy Activities: 1. Rarely or not at all Pain Interference with Day-to-Day Activities: 1. Rarely or not at all Vision/Perception  Perception Perception: Within Functional Limits Praxis Praxis: Intact  Cognition Overall Cognitive Status: Within Functional Limits for tasks assessed Arousal/Alertness: Awake/alert Orientation Level: Oriented X4 Year: 2023 Attention: Focused;Sustained Focused Attention: Appears intact Sustained Attention: Appears intact Memory: Appears  intact Awareness: Appears intact Problem Solving: Appears intact Safety/Judgment: Appears intact Sensation Sensation Light Touch: Impaired Detail Light Touch Impaired Details: Impaired LLE Proprioception: Appears Intact Coordination Gross Motor Movements are Fluid and Coordinated: No Fine Motor Movements are Fluid and Coordinated: Yes Coordination and Movement Description: grossly uncoordinated d/t recent amputation Motor  Motor Motor: Within Functional Limits Motor - Skilled Clinical Observations: altered d/t recent amputation Motor - Discharge Observations: altered d/c recent amputation  Mobility Bed Mobility Bed Mobility: Rolling Right;Rolling Left;Supine to Sit;Sit to Supine Rolling Right: Independent with assistive device Rolling Left: Independent with assistive device Supine to Sit: Independent with assistive device Sit to Supine: Independent with assistive device Transfers Transfers: Sit to Stand;Stand Pivot Transfers Sit to Stand: Contact Guard/Touching assist Stand to Sit: Contact Guard/Touching assist Stand Pivot Transfers: Minimal Assistance - Patient > 75% Transfer (Assistive device): Rolling walker Locomotion  Gait Ambulation: Yes Gait Assistance: Contact Guard/Touching assist Gait Distance (Feet): 20 Feet Assistive device: Rolling walker Gait Gait: Yes Gait Pattern: Impaired Gait Pattern:  ("hopping" due to R BKA) Gait velocity: decreased Stairs / Additional Locomotion Stairs: Yes Stairs Assistance: Minimal Assistance - Patient > 75% Stair Management Technique: Other (comment) (in // bars) Number of Stairs: 1 Height of Stairs: 3.75 Pick up small object from the floor assist level: Contact Guard/Touching assist Pick up small object from the floor assistive device: reacher and RW Wheelchair Mobility Wheelchair Mobility: Yes Wheelchair Assistance: Chartered loss adjuster: Both upper extremities Wheelchair Parts Management: Needs  assistance Distance: 250  Trunk/Postural Assessment  Cervical Assessment Cervical Assessment: Within Functional Limits Thoracic Assessment Thoracic Assessment: Within Functional Limits Lumbar Assessment Lumbar Assessment: Within Functional Limits Postural Control Postural Control: Within Functional Limits  Balance Balance Balance Assessed: Yes Static Sitting Balance Static Sitting - Balance Support:  No upper extremity supported Static Sitting - Level of Assistance: 5: Stand by assistance Dynamic Sitting Balance Dynamic Sitting - Balance Support: No upper extremity supported;During functional activity Dynamic Sitting - Level of Assistance: 5: Stand by assistance Static Standing Balance Static Standing - Balance Support: Bilateral upper extremity supported;During functional activity Static Standing - Level of Assistance: 4: Min assist Dynamic Standing Balance Dynamic Standing - Balance Support: Bilateral upper extremity supported;During functional activity Dynamic Standing - Level of Assistance: 4: Min assist Extremity Assessment   RLE Assessment RLE Assessment: Exceptions to [facility] Passive Range of Motion (PROM) Comments: lacking ~10 deg knee extension General Strength Comments: at least 3/5 knee flexion/extension, no resistance d/t pain LLE Assessment LLE Assessment: Exceptions to [facility] Passive Range of Motion (PROM) Comments: lacking ~10 deg knee extension General Strength Comments: not formally tested 2/2 pain     Excell Seltzer, PT, DPT, CSRS 08/30/2021, 5:07 PM

## 2021-08-27 NOTE — Progress Notes (Signed)
Pt's heart rate continues to stay in the 130s even while pt is sleeping.  Pt is not showing any signs nor symptoms of distress.  Will continue to monitor pt throughout the night.  Linna Hoff, [place], made aware.

## 2021-08-27 NOTE — Progress Notes (Addendum)
Patient ID: Scott Castro, male   DOB: Jul 03, 1956, 66 y.o.   MRN: [id number]  Met with pt to see how he is doing. He hopes his leg can be saved, he is aware of his test tomorrow and MD reports his therapies will be on hold. Called cousin-Lori to let her know and will need to re-schedule family education. Will await her return call. Have had pt complete [place] transportation form and will fax in. Still waiting to see if slot at [place] for [place] working on. Pt reports his ramp is in place, gentleman to come see him today that completed it. Provided support to pt and will continue to provide counseling to pt. Waiver emailed for [employer] to use for follow up appointments.  2:00 PM Message left or Lori-cousin regarding pt's therapies being held tomorrow for test. Will need to re-schedule his family education. Await return call

## 2021-08-28 ENCOUNTER — Encounter (HOSPITAL_COMMUNITY)
Admission: RE | Disposition: A | Discharge status: Other Acute Inpatient Hospital | Payer: Self-pay | Source: Other Acute Inpatient Hospital | Attending: Physical Medicine and Rehabilitation

## 2021-08-28 DIAGNOSIS — I4811 Longstanding persistent atrial fibrillation: Secondary | ICD-10-CM

## 2021-08-28 HISTORY — PX: ABDOMINAL AORTOGRAM W/LOWER EXTREMITY: CATH118223

## 2021-08-28 LAB — CBC
HCT: 26.5 % — ABNORMAL LOW (ref 39.0–52.0)
Hemoglobin: 8.4 g/dL — ABNORMAL LOW (ref 13.0–17.0)
MCH: 30.2 pg (ref 26.0–34.0)
MCHC: 31.7 g/dL (ref 30.0–36.0)
MCV: 95.3 fL (ref 80.0–100.0)
Platelets: 317 10*3/uL (ref 150–400)
RBC: 2.78 MIL/uL — ABNORMAL LOW (ref 4.22–5.81)
RDW: 17 % — ABNORMAL HIGH (ref 11.5–15.5)
WBC: 5.4 10*3/uL (ref 4.0–10.5)
nRBC: 0 % (ref 0.0–0.2)

## 2021-08-28 LAB — BPAM RBC
Blood Product Expiration Date: 202301262359
ISSUE DATE / TIME: 202301191512
Unit Type and Rh: 5100

## 2021-08-28 LAB — TYPE AND SCREEN
ABO/RH(D): O POS
Antibody Screen: NEGATIVE
Unit division: 0

## 2021-08-28 LAB — APTT: aPTT: 47 seconds — ABNORMAL HIGH (ref 24–36)

## 2021-08-28 LAB — HEPARIN LEVEL (UNFRACTIONATED): Heparin Unfractionated: >1.1 IU/mL — ABNORMAL HIGH (ref 0.30–0.70)

## 2021-08-28 SURGERY — ABDOMINAL AORTOGRAM W/LOWER EXTREMITY
Anesthesia: LOCAL

## 2021-08-28 MED ORDER — IODIXANOL 320 MG/ML IV SOLN
INTRAVENOUS | Status: DC | PRN
  Administered 2021-08-28: 75 mL

## 2021-08-28 MED ORDER — SODIUM CHLORIDE 0.9 % IV SOLN: 250.0000 mL | INTRAVENOUS | Status: DC | PRN

## 2021-08-28 MED ORDER — HEPARIN (PORCINE) IN NACL 1000-0.9 UT/500ML-% IV SOLN
INTRAVENOUS | Status: AC
  Filled 2021-08-28: qty 500

## 2021-08-28 MED ORDER — LIDOCAINE HCL (PF) 1 % IJ SOLN
INTRAMUSCULAR | Status: AC
  Filled 2021-08-28: qty 30

## 2021-08-28 MED ORDER — MIDAZOLAM HCL 2 MG/2ML IJ SOLN
INTRAMUSCULAR | Status: DC | PRN
  Administered 2021-08-28: 1 mg via INTRAVENOUS

## 2021-08-28 MED ORDER — HEPARIN (PORCINE) IN NACL 1000-0.9 UT/500ML-% IV SOLN
INTRAVENOUS | Status: DC | PRN
  Administered 2021-08-28 (×2): 500 mL

## 2021-08-28 MED ORDER — SODIUM CHLORIDE 0.9 % IV SOLN: INTRAVENOUS | Status: DC

## 2021-08-28 MED ORDER — FENTANYL CITRATE (PF) 100 MCG/2ML IJ SOLN
INTRAMUSCULAR | Status: DC | PRN
  Administered 2021-08-28: 50 ug via INTRAVENOUS

## 2021-08-28 MED ORDER — HEPARIN (PORCINE) 25000 UT/250ML-% IV SOLN
1950.0000 [IU]/h | INTRAVENOUS | Status: DC
  Administered 2021-08-28: 1800 [IU]/h via INTRAVENOUS
  Administered 2021-08-29 (×2): 1950 [IU]/h via INTRAVENOUS
  Filled 2021-08-28 (×4): qty 250

## 2021-08-28 MED ORDER — FENTANYL CITRATE (PF) 100 MCG/2ML IJ SOLN
INTRAMUSCULAR | Status: AC
  Filled 2021-08-28: qty 2

## 2021-08-28 MED ORDER — MIDAZOLAM HCL 2 MG/2ML IJ SOLN
INTRAMUSCULAR | Status: AC
  Filled 2021-08-28: qty 2

## 2021-08-28 MED ORDER — LIDOCAINE HCL (PF) 1 % IJ SOLN
INTRAMUSCULAR | Status: DC | PRN
  Administered 2021-08-28: 12 mL

## 2021-08-28 SURGICAL SUPPLY — 9 items
CATH OMNI FLUSH 5F 65CM (CATHETERS) ×1 IMPLANT
DEVICE CLOSURE MYNXGRIP 5F (Vascular Products) ×1 IMPLANT
KIT MICROPUNCTURE NIT STIFF (SHEATH) ×1 IMPLANT
KIT PV (KITS) ×2 IMPLANT
SHEATH PINNACLE 5F 10CM (SHEATH) ×1 IMPLANT
SYR MEDRAD MARK 7 150ML (SYRINGE) ×2 IMPLANT
TRANSDUCER W/STOPCOCK (MISCELLANEOUS) ×2 IMPLANT
TRAY PV CATH (CUSTOM PROCEDURE TRAY) ×2 IMPLANT
WIRE STARTER BENTSON 035X150 (WIRE) ×1 IMPLANT

## 2021-08-28 NOTE — Progress Notes (Signed)
0.9NS infusing at 10cc/hr to rt forearm

## 2021-08-28 NOTE — Op Note (Signed)
DATE OF SERVICE: 08/28/2021  PATIENT:  Scott Castro  66 y.o. male  PRE-OPERATIVE DIAGNOSIS:  Atherosclerosis of native arteries of left lower extremity causing ischemic skin changes  POST-OPERATIVE DIAGNOSIS:  Same  PROCEDURE:   1) US guided right common femoral artery access 2) Aortogram 3) Left lower extremity angiogram with second order cannulation (72mL total contrast) 4) Conscious sedation (31 minutes)  SURGEON:  [name], MD  ASSISTANT: none  ANESTHESIA:   local and IV sedation  ESTIMATED BLOOD LOSS: minimal  LOCAL MEDICATIONS USED:  LIDOCAINE   COUNTS: confirmed correct.  PATIENT DISPOSITION:  PACU - hemodynamically stable.   Delay start of Pharmacological VTE agent (>24hrs) due to surgical blood loss or risk of bleeding: no  INDICATION FOR PROCEDURE: Scott Castro is a 66 y.o. male with ischemic skin changes about the left foot, and non-invasive evidence of peripheral arterial disease. After careful discussion of risks, benefits, and alternatives the patient was offered angiography. The patient understood and wished to proceed.  OPERATIVE FINDINGS:  Terminal aorta and iliac arteries: Aorta and iliacs widely patent Renals occluded  Left lower extremity: Common femoral artery: patent without stenosis  Profunda femoris artery: patent without stenosis   Superficial femoral artery: patent without stenosis  Popliteal artery: patent without stenosis  Anterior tibial artery: only tibial vessel; occludes at the ankle.  Tibioperoneal trunk: occluded Peroneal artery: occluded Posterior tibial artery: occluded Pedal circulation: fills via collaterals  DESCRIPTION OF PROCEDURE: After identification of the patient in the pre-operative holding area, the patient was transferred to the operating room. The patient was positioned supine on the operating room table. Anesthesia was induced. The groins was prepped and draped in standard fashion. A surgical pause was performed  confirming correct patient, procedure, and operative location.  The right groin was anesthetized with subcutaneous injection of 1% lidocaine. Using ultrasound guidance, the right common femoral artery was accessed with micropuncture technique. Fluoroscopy was used to confirm cannulation over the femoral head. The 31F sheath was upsized to 64F.   A Benson wire was advanced into the distal aorta. Over the wire an omni flush catheter was advanced to the level of L2. Aortogram was performed - see above for details.   The left common iliac artery was selected with a benson guidewire. The wire was advanced into the common femoral artery. Over the wire the omni flush catheter was advanced into the external iliac artery. Selective angiography was performed - see above for details.   Conscious sedation was administered with the use of IV fentanyl and midazolam under continuous physician and nurse monitoring.  Heart rate, blood pressure, and oxygen saturation were continuously monitored.  Total sedation time was 31 minutes.  Upon completion of the case instrument and sharps counts were confirmed correct. The patient was transferred to the PACU in good condition. I was present for all portions of the procedure.  PLAN: No options for revascularization. Will need a left below knee amputation which is not urgent. He is depressed, and wants to speak to a chaplain and palliative care to discuss options and to help with spiritual care. Maximal medical therapy for PAD.   [name], MD [facility] Phone Number: [phone] 08/28/2021 1:34 PM

## 2021-08-28 NOTE — Progress Notes (Addendum)
Pt has been accepted at [facility] on MWF when pt is stable for d/c. Pt will have a 10:35 chair time and will need to arrive at 9:50 for first appt to complete paperwork. Attempted to meet with pt at bedside to discuss the above but pt was off the unit for a procedure. Will continue efforts to meet with pt. Will place arrangements on pt's AVS. Update provided to nephrologist. Will assist as needed.   Melven Sartorius Renal Navigator [phone]

## 2021-08-28 NOTE — Progress Notes (Signed)
PROGRESS NOTE   Subjective/Complaints:  Pt reports feeling OK- HR 115- doesn't notice it- has no symptoms of low BP or high HR.   - and o2 sats dropped to 75-80% when took O2 off in room- asked him to put back on- it bounced up to 96%, but then back down- increased O2 temporarily to 3L- came back up to 85% or so, but d/w nursing- not sure measuring right, since was loose on ear.   Hanging L foot off bed- says due to pain being so bad wants to cry.   Has arteriogram at 11:30am.    ROS:   Pt denies SOB, abd pain, CP, N/V/C/D, and vision changes   Objective:   No results found. Recent Labs    08/27/21 1830 08/28/21 0441  WBC 6.8 5.4  HGB 7.5* 8.4*  HCT 24.0* 26.5*  PLT 368 317    Recent Labs    08/26/21 1004 08/27/21 1830  NA 131* 128*  K 3.6 4.1  CL 95* 92*  CO2 27 23  GLUCOSE 134* 104*  BUN 31* 49*  CREATININE 4.82* 6.45*  CALCIUM 7.3* 7.2*     Intake/Output Summary (Last 24 hours) at 08/28/2021 0801 Last data filed at 08/27/2021 1831 Gross per 24 hour  Intake 950 ml  Output 1500 ml  Net -550 ml     Pressure Injury 08/26/21 Heel Right Deep Tissue Pressure Injury - Purple or maroon localized area of discolored intact skin or blood-filled blister due to damage of underlying soft tissue from pressure and/or shear. (Active)  08/26/21 1826  Location: Heel  Location Orientation: Right  Staging: Deep Tissue Pressure Injury - Purple or maroon localized area of discolored intact skin or blood-filled blister due to damage of underlying soft tissue from pressure and/or shear.  Wound Description (Comments):   Present on Admission: No    Physical Exam: Vital Signs Blood pressure 108/71, pulse (!) 113, temperature 98.6 F (37 C), resp. rate 18, height 5\' 9"  (1.753 m), weight 70.4 kg, SpO2 100 %.        Based on review of chart, got diltiazem last 2 days.  General: awake, alert, appropriate, NAD HENT:  conjugate gaze; oropharynx moist CV: in afib- HR in 110s- RVR, ; no JVD Pulmonary: CTA B/L; no W/R/R- good air movement- O2 2-3L O2 by [facility]: soft, NT, ND, (+)BS Psychiatric: appropriate- brighter affect- pt "said so".  Neurological: Ox3  Skin: mottled appearance of L foot from arch to toes and also on bottom of foot to base of MTPs- looks worse with significant blisters rasing up on dorsum of L foot-  R BKA- ecchymoses is spreading to around incision somewhat on RLE. Looks similar to yesterday.         Musculoskeletal:  LLE a few muscle spasms seen- not spasticity based on no increased tone  Extremities- LLE more swollen this AM- 2+ LLE edema      Assessment/Plan: 1. Functional deficits which require 3+ hours per day of interdisciplinary therapy in a comprehensive inpatient rehab setting. Physiatrist is providing close team supervision and 24 hour management of active medical problems listed below. Physiatrist and rehab team continue to assess barriers  to discharge/monitor patient progress toward functional and medical goals  Care Tool:  Bathing  Bathing activity did not occur: Safety/medical concerns Body parts bathed by patient: Right arm, Left arm, Abdomen, Chest, Right upper leg, Left upper leg, Face, Front perineal area, Buttocks   Body parts bathed by helper: Left lower leg Body parts n/a: Right lower leg   Bathing assist Assist Level: Minimal Assistance - Patient > 75%     Upper Body Dressing/Undressing Upper body dressing Upper body dressing/undressing activity did not occur (including orthotics): Safety/medical concerns What is the patient wearing?: Pull over shirt    Upper body assist Assist Level: Set up assist    Lower Body Dressing/Undressing Lower body dressing      What is the patient wearing?: Pants, Underwear/pull up     Lower body assist Assist for lower body dressing: Minimal Assistance - Patient > 75%     Toileting Toileting     Toileting assist Assist for toileting: Contact Guard/Touching assist     Transfers Chair/bed transfer  Transfers assist     Chair/bed transfer assist level: Contact Guard/Touching assist     Locomotion Ambulation   Ambulation assist   Ambulation activity did not occur: Safety/medical concerns  Assist level: Contact Guard/Touching assist Assistive device: Walker-rolling Max distance: 20   Walk 10 feet activity   Assist  Walk 10 feet activity did not occur: Safety/medical concerns  Assist level: Contact Guard/Touching assist Assistive device: Walker-rolling   Walk 50 feet activity   Assist Walk 50 feet with 2 turns activity did not occur: Safety/medical concerns         Walk 150 feet activity   Assist Walk 150 feet activity did not occur: Safety/medical concerns         Walk 10 feet on uneven surface  activity   Assist Walk 10 feet on uneven surfaces activity did not occur: Safety/medical concerns         Wheelchair     Assist Is the patient using a wheelchair?: Yes Type of Wheelchair: Manual    Wheelchair assist level: Supervision/Verbal cueing Max wheelchair distance: 250    Wheelchair 50 feet with 2 turns activity    Assist        Assist Level: Supervision/Verbal cueing   Wheelchair 150 feet activity     Assist      Assist Level: Supervision/Verbal cueing   Blood pressure 108/71, pulse (!) 113, temperature 98.6 F (37 C), resp. rate 18, height 5\' 9"  (1.753 m), weight 70.4 kg, SpO2 100 %.  Medical Problem List and Plan: 1. Functional deficits secondary to recent right below the knee amputation secondary to critical limb ischemia, peripheral arterial disease, severe right lower extremity tibial artery disease.             -patient may shower but incision must be covered.              -ELOS/Goals: 1/18,   Supervision        con't CIR- PT and OT- pt says cannot get ramp by 1/18 and needs to extend stay- will d/w  team at team conference today  1/20- con't CIR_ on hold today- has ramp now- arteriogram today.  2.  Antithrombotics: -DVT/anticoagulation:  Mechanical: Sequential compression devices, below knee Left lower extremity  1/19- on Heparin gtt- Eliquis held for arteriogram.  Pharmaceutical: Other (comment) apixaban             -antiplatelet therapy: aspirin  3. Pain Management:  .  1/5-changed Norco to Oxycodone 5-10 mg q4 hours prn- s  1/13- pain seems controlled. Have discussed massage, vision rx   -he's tolerating shrinker 11/17- only taking 1-2x/day- con't regimen  1/19- having more pain- was resting LLE off bed- more swollen- asked him not to.   1/20- hadn't taken pain meds yet this Am- encouraged pt to do so.  4. Mood: LCSW to evaluate and provide emotional support             -antipsychotic agents: Not applicable 5. Neuropsych: This patient is capable of making decisions on his own behalf. 6. Skin/Wound Care: tolerating shrinker ,using limb guard  -continue shrinker, +dressing if needed as he has occasional drainage No drainage, + edema, ecchymosis unchanged   1/19- R BKA having skin slough- also LLE looks worse- going for arteriogram tomorrow  1/20- arteriogram today- skin looks worse than yesterday 7. Fluids/Electrolytes/Nutrition: Fluid restriction, monitor intake and daily weight 8.  End-stage renal disease on chronic intermittent hemodialysis.  T/T/S -HD at end of day to avoid conflicting with therapy -volume mgt per nephrology team, has had issues with hypotension a few times after HD 1/19- HD today- will see if can get transfusion of 1 unit- HR 120s- could be be made worse by anemia.  9: Atrial fibrillation: Continue Lopressor, Eliquis  1/17- rate controlled- con't regimen  1/20- HR elevated- in RVR slightly- has received diltiazem for last 24 hours- will call Cards.  10: Hyperlipidemia: Continue Lipitor 11: COPD/asthma: albuterol neb PRN  -1/13 pO2 61 on ABG  yesterday   -will need to go home on oxygen   -apparently has some equipment per above  1/16- Is on Home O2- will need to go home on Home O2- concerned about documentation of O2 sats of 60% this afternoon- on RA_ asked nursing to go eval pt and make sure if it's correct or not- and call me back if any issues  1/17- O2 drop yesterday was due to cold hands- sats in 90s- rewrote orders for home O2. 1/20- O2 levels dropping when takes O2 off.   12: Renal osteodystrophy: Renvela and Sensipar as per nephrology 13: Barrett's Esophagus: Continue PPI 14: Anemia, iron deficiency/chronic disease: Aransep and Venofer per nephrology. 15: Peripheral arterial disease s/p right BKA on 12/30.  --Was discussed with Dr. Delana Meyer (on call vascular surgeon for Dr. Lucky Cowboy) regarding antiplatelet therapy>>recommends aspirin 81 mg daily --Follow-up with Dr. Lucky Cowboy as outpatient  12. Depression 1/5- started Zoloft 50 mg daily for reactive depression 1/17- much brighter affect, but did mention will NOT go through another amputation.   1/19- Tearful- increased Zoloft to 100 mg daily 17. Low grade temp-   1/18- Labs look ok, no leukocytosis 18. Mottling of L foot due to PAD  1/18- vascualr taking for arteriogram Friday- on Heparin gtt- cannot do tx dose lovenox due to ESRD  1/19- arteriogram tomorrow- looking worse daily-  19. Hypotension  1/18- asking Renal if can do midodrine? 1/19- midodrine started yesterday- 10 mg TID_ working better  I spent a total of 38 minutes on total care- >50% coordination of care- d/w nursing and PA about afib.    LOS: 16 days A FACE TO FACE EVALUATION WAS PERFORMED  Whit Bruni 08/28/2021, 8:01 AM

## 2021-08-28 NOTE — Consult Note (Signed)
Cardiology Consultation:   Patient ID: Scott Castro MRN: [id number]; DOB: 17-Mar-1956  Admit date: 08/12/2021 Date of Consult: 08/28/2021  PCP:  Franciso Bend, MD   [facility] Providers Cardiologist: Dr Aris Georgia [facility] clinic) Click here to update MD or APP on Care Team, Refresh:1}     Patient Profile:   Scott Castro is a 66 y.o. male with a hx of atrial fibrillation who is being seen 08/28/2021 for the evaluation of increased heart rate at the request of Dr. Dagoberto Ligas..  History of Present Illness:   Scott Castro is an unfortunate 66 year old thin appearing single Caucasian male who lives alone.  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  He has a history of COPD, hypertension, hyperlipidemia, PAD with critical limb ischemia, atrial fibrillation and end-stage renal disease on hemodialysis for the last 16 years.  We are asked to see him because of tachycardia.  His cardiologist is Dr.Parachos Crary.  He has 2D echo that was performed 04/09/2019 that revealed normal LV function.  He apparently had COVID several months ago.  He saw podiatrist in [place] (Dr. Daylene Katayama) for a sore on his right foot.  He was told that he had gout.  He came back several months later was diagnosed with PAD.  He underwent right BKA by Dr. Lucky Cowboy at [facility] 08/07/2021.  Because of progressive critical limb ischemia on the left he underwent angiography by Dr. Standley Dakins this morning revealing severe diffuse infrapopliteal disease not amenable to intervention and apparently he will require a left BKA as well.   Past Medical History:  Diagnosis Date   Barrett's esophagus    Chronic kidney disease    CHRONIC\   Glomerulosclerosis, focal    Hyperparathyroidism due to renal insufficiency [facility])     Past Surgical History:  Procedure Laterality Date   AMPUTATION Right 08/07/2021   Procedure: AMPUTATION BELOW KNEE;  Surgeon: Algernon Huxley, MD;  Location: [facility] ORS;  Service: Vascular;  Laterality: Right;    AV FISTULA PLACEMENT     COLONOSCOPY N/A 03/22/2016   Procedure: COLONOSCOPY;  Surgeon: Manya Silvas, MD;  Location: [facility] ENDOSCOPY;  Service: Endoscopy;  Laterality: N/A;   DG AV DIALYSIS GRAFT DECLOT OR     ESOPHAGOGASTRODUODENOSCOPY (EGD) WITH PROPOFOL  03/22/2016   Procedure: ESOPHAGOGASTRODUODENOSCOPY (EGD) WITH PROPOFOL;  Surgeon: Manya Silvas, MD;  Location: [facility] ENDOSCOPY;  Service: Endoscopy;;   FLEXIBLE BRONCHOSCOPY     KIDNEY TRANSPLANT Right 1985   LOWER EXTREMITY ANGIOGRAPHY Right 07/20/2021   Procedure: Lower Extremity Angiography;  Surgeon: Algernon Huxley, MD;  Location: [place] CV LAB;  Service: Cardiovascular;  Laterality: Right;   LOWER EXTREMITY ANGIOGRAPHY Right 08/06/2021   Procedure: Lower Extremity Angiography;  Surgeon: Algernon Huxley, MD;  Location: [place] CV LAB;  Service: Cardiovascular;  Laterality: Right;   REMOVAL TENCKHOFF CATH       Home Medications:  Prior to Admission medications   Medication Sig Start Date End Date Taking? Authorizing Provider  albuterol (VENTOLIN HFA) 108 (90 Base) MCG/ACT inhaler Inhale 2 puffs into the lungs every 6 (six) hours as needed for wheezing or shortness of breath. 07/08/21   Enzo Bi, MD  apixaban (ELIQUIS) 5 MG TABS tablet Take 1 tablet (5 mg total) by mouth 2 (two) times daily. 07/22/21   Sharen Hones, MD  atorvastatin (LIPITOR) 10 MG tablet Take 1 tablet (10 mg total) by mouth at bedtime. 07/22/21   Sharen Hones, MD  cinacalcet (  SENSIPAR) 60 MG tablet Take 60 mg by mouth daily.    [provider]  diclofenac Sodium (VOLTAREN) 1 % GEL Apply 2 g topically 4 (four) times daily. Use on feet    [provider]  diltiazem (CARDIZEM CD) 120 MG 24 hr capsule Take 1 capsule (120 mg total) by mouth daily. Hold for sbp < 100 08/12/21   Antonieta Pert, MD  feeding supplement (ENSURE ENLIVE / ENSURE PLUS) LIQD Take 237 mLs by mouth 2 (two) times daily between meals. 08/12/21   Antonieta Pert, MD  gabapentin  (NEURONTIN) 100 MG capsule Take 1 capsule (100 mg total) by mouth 2 (two) times daily. 08/12/21   Antonieta Pert, MD  HYDROcodone-acetaminophen (NORCO) 7.5-325 MG tablet Take 1 tablet by mouth 3 (three) times daily as needed. 08/05/21   [provider]  methocarbamol (ROBAXIN) 500 MG tablet Take 1 tablet (500 mg total) by mouth every 8 (eight) hours as needed for muscle spasms. 08/12/21   Antonieta Pert, MD  metoprolol tartrate (LOPRESSOR) 25 MG tablet Take 0.5 tablets (12.5 mg total) by mouth 2 (two) times daily. 08/12/21   Antonieta Pert, MD  pantoprazole (PROTONIX) 40 MG tablet Take 40 mg by mouth daily. 03/27/19   [provider]  polyethylene glycol (MIRALAX / GLYCOLAX) 17 g packet Take 17 g by mouth daily as needed. 08/12/21   Antonieta Pert, MD  senna-docusate (SENOKOT-S) 8.6-50 MG tablet Take 2 tablets by mouth 2 (two) times daily. 08/12/21   Antonieta Pert, MD  sevelamer carbonate (RENVELA) 800 MG tablet Take by mouth See admin instructions. Take 2400 mg by mouth three times daily, 1600 mg with snacks    [provider]    Inpatient Medications: Scheduled Meds:  sodium chloride   Intravenous Once   aspirin EC  81 mg Oral Daily   atorvastatin  10 mg Oral QHS   cinacalcet  60 mg Oral Q supper   Darbepoetin Alfa  60 mcg Intravenous Q Tue-HD   diltiazem  120 mg Oral Daily   gabapentin  100 mg Oral BID   hydrocortisone  25 mg Rectal BID   metoprolol tartrate  12.5 mg Oral BID   midodrine  10 mg Oral TID WC   pantoprazole  40 mg Oral Daily   senna-docusate  2 tablet Oral BID   sertraline  100 mg Oral Daily   sevelamer carbonate  2.4 g Oral TID WC   Continuous Infusions:  sodium chloride     [START ON 08/29/2021] sodium chloride     ferric gluconate (FERRLECIT) IVPB 125 mg (08/22/21 2111)   heparin 1,800 Units/hr (08/28/21 1113)   PRN Meds: [START ON 08/29/2021] sodium chloride, acetaminophen, albuterol, bisacodyl, calcium carbonate, diphenhydrAMINE, guaiFENesin-dextromethorphan, milk  and molasses, oxyCODONE, prochlorperazine **OR** prochlorperazine **OR** prochlorperazine, senna-docusate, sevelamer carbonate  Allergies:   No Known Allergies  Social History:   Social History   Socioeconomic History   Marital status: Single    Spouse name: Not on file   Number of children: Not on file   Years of education: Not on file   Highest education level: Not on file  Occupational History   Not on file  Tobacco Use   Smoking status: Never   Smokeless tobacco: Never  Substance and Sexual Activity   Alcohol use: Not Currently    Comment: very little   Drug use: No   Sexual activity: Not on file  Other Topics Concern   Not on file  Social History Narrative  Not on file   Social Determinants of Health   Financial Resource Strain: Not on file  Food Insecurity: Not on file  Transportation Needs: Not on file  Physical Activity: Not on file  Stress: Not on file  Social Connections: Not on file  Intimate Partner Violence: Not on file    Family History:    Family History  Problem Relation Age of Onset   Heart disease Mother    Heart disease Father      ROS:  Please see the history of present illness.   All other ROS reviewed and negative.     Physical Exam/Data:   Vitals:   08/28/21 1520 08/28/21 1545 08/28/21 1652 08/28/21 1655  BP: 132/72 114/73    Pulse: (!) 112 89 (!) 0   Resp: 10 15  (!) 0  Temp:      TempSrc:      SpO2: 100% 95%    Weight:      Height:        Intake/Output Summary (Last 24 hours) at 08/28/2021 1703 Last data filed at 08/27/2021 1831 Gross per 24 hour  Intake 315 ml  Output 1500 ml  Net -1185 ml   Last 3 Weights 08/27/2021 08/27/2021 08/27/2021  Weight (lbs) 155 lb 3.3 oz 158 lb 8.2 oz 161 lb 2.5 oz  Weight (kg) 70.4 kg 71.9 kg 73.1 kg     Body mass index is 22.92 kg/m.  General:  Well nourished, well developed, in no acute distress HEENT: normal Neck: no JVD Vascular: No carotid bruits; Distal pulses 2+  bilaterally Cardiac:  normal S1, S2; irregularly irregular; no murmur  Lungs:  clear to auscultation bilaterally, no wheezing, rhonchi or rales  Abd: soft, nontender, no hepatomegaly  Ext: Status post right BKA, cyanosis distal part of his left foot Musculoskeletal:  No deformities, BUE and BLE strength normal and equal Skin: warm and dry  Neuro:  CNs 2-12 intact, no focal abnormalities noted Psych:  Normal affect   EKG:  The EKG was personally reviewed and demonstrates: Atrial fibrillation with ventricular sponsor 144 on 07/19/2021. Telemetry:  Telemetry was personally reviewed and demonstrates: Patient is not on telemetry  Relevant CV Studies: None  Laboratory Data:  High Sensitivity Troponin:  No results for input(s): TROPONINIHS in the last 720 hours.   Chemistry Recent Labs  Lab 08/25/21 1639 08/26/21 1004 08/27/21 1830  NA 126* 131* 128*  K 4.2 3.6 4.1  CL 93* 95* 92*  CO2 24 27 23   GLUCOSE 112* 134* 104*  BUN 56* 31* 49*  CREATININE 7.80* 4.82* 6.45*  CALCIUM 6.4* 7.3* 7.2*  GFRNONAA 7* 13* 9*  ANIONGAP 9 9 13     Recent Labs  Lab 08/26/21 1004 08/27/21 1830  ALBUMIN 1.8* 1.8*   Lipids No results for input(s): CHOL, TRIG, HDL, LABVLDL, LDLCALC, CHOLHDL in the last 168 hours.  Hematology Recent Labs  Lab 08/27/21 0515 08/27/21 1830 08/28/21 0441  WBC 6.1 6.8 5.4  RBC 2.67* 2.43* 2.78*  HGB 7.9* 7.5* 8.4*  HCT 25.9* 24.0* 26.5*  MCV 97.0 98.8 95.3  MCH 29.6 30.9 30.2  MCHC 30.5 31.3 31.7  RDW 16.0* 16.0* 17.0*  PLT 332 368 317   Thyroid No results for input(s): TSH, FREET4 in the last 168 hours.  BNPNo results for input(s): BNP, PROBNP in the last 168 hours.  DDimer No results for input(s): DDIMER in the last 168 hours.   Radiology/Studies:  No results found.   Assessment and Plan:  Critical limb ischemia- status post right BKA on 08/07/2021 by Dr. Lucky Cowboy.  Angiography this morning by Dr. Percival Spanish revealed diffuse infrapopliteal disease on the  left with no revascularization options.  The patient will need a left BKA in the near future as well. Atrial fibrillation-history of A. fib on Eliquis.  He is on Cardizem CD 120 and metoprolol twice daily.  His heart rate is in the 100-116 range.  The issue will be managing his heart rate while not exacerbating hypotension.  We could titrate his metoprolol from 12.5 mg p.o. twice daily to 25 mg p.o. twice daily and follow his blood pressure carefully.  The patient is on Eliquis at home, currently on heparin because of recent angiogram. Hyperlipidemia-on low-dose atorvastatin COPD-on home O2 End-stage renal disease-on hemodialysis for the last 16 years.  Left upper extremity AV fistula.   Risk Assessment/Risk Scores:          CHA2DS2-VASc Score =   4  This indicates a  % annual risk of stroke. The patient's score is based upon:           For questions or updates, please contact [place] Please consult [email or web address] for contact info under    Signed, Quay Burow, MD  08/28/2021 5:03 PM

## 2021-08-28 NOTE — Progress Notes (Signed)
ANTICOAGULATION CONSULT NOTE   Pharmacy Consult for apixaban>>heparin Indication: atrial fibrillation  No Known Allergies  Patient Measurements: Height: 5\' 9"  (175.3 cm) Weight: 70.4 kg (155 lb 3.3 oz) IBW/kg (Calculated) : 70.7 Heparin Dosing Weight: 73kg  Vital Signs: Temp: 98.3 F (36.8 C) (01/20 1158) BP: 128/71 (01/20 1435) Pulse Rate: 112 (01/20 1435)  Labs: Recent Labs    08/25/21 1639 08/26/21 1004 08/27/21 0515 08/27/21 1514 08/27/21 1830 08/28/21 0441  HGB 7.4* 7.2* 7.9*  --  7.5* 8.4*  HCT 23.8* 23.1* 25.9*  --  24.0* 26.5*  PLT 346 276 332  --  368 317  APTT  --   --  49* 46*  --  47*  HEPARINUNFRC  --   --  >1.10*  --   --  >1.10*  CREATININE 7.80* 4.82*  --   --  6.45*  --      Estimated Creatinine Clearance: 11.2 mL/min (A) (by C-G formula based on SCr of 6.45 mg/dL (H)).  Assessment: Pt recent had right BKA for PAD. He has been on apixaban for his AF. Pt to have left angiogram now so apixaban was ordered to be changed to IV heparin. His last dose was on 1/18.   He is s/p left angiogram with no options for revascularization and will need a left BKA. Pharmacy consulted to resume IV heparin 4 hrs post sheath removal. Given recent apixaban use, will monitor anticoagulation using aPTT until aPTT and heparin levels correlate. Sheath out at 13:42. No bleeding noted.  Goal of Therapy:  Heparin level 0.3-0.7 units/ml PTT 66-102 Monitor platelets by anticoagulation protocol: Yes   Plan:  Resume heparin infusion at 1800 units/hr with no bolus at 17:45 8 hr PTT  Daily PTT, heparin level, CBC Monitor for s/sx of bleeding  Thank you for involving pharmacy in this patient's care.  Renold Genta, PharmD, BCPS Clinical Pharmacist Clinical phone for 08/28/2021 until 10p is [phone] 08/28/2021 3:27 PM  **Pharmacist phone directory can be found on [email or web address] listed under [place]**

## 2021-08-28 NOTE — Progress Notes (Signed)
[facility] [place] PROGRESS NOTE  ASSESSMENT / PLAN: Scott Castro is a 66 y.o. male with atherosclerosis of native arteries of left lower extremity causing ischemic rest pain and ischemic skin changes.  Recommend the following which can slow the progression of atherosclerosis and reduce the risk of major adverse cardiac / limb events:  Complete cessation from all tobacco products. Blood glucose control with goal A1c < 7%. Blood pressure control with goal blood pressure < 140/90 mmHg. Lipid reduction therapy with goal LDL-C <100 mg/dL (<70 if symptomatic from PAD).  Aspirin 81mg  PO QD.  Atorvastatin 40-80mg  PO QD (or other "high intensity" statin therapy).  Plan left lower extremity angiogram with possible intervention via right common femoral artery approach in cath lab today, 08/28/21.   SUBJECTIVE: No complaints. Understanding of plan.   OBJECTIVE: BP 104/71 (BP Location: Left Arm)    Pulse (!) 115    Temp 98.3 F (36.8 C)    Resp 17    Ht 5\' 9"  (1.753 m)    Wt 70.4 kg    SpO2 100%    BMI 22.92 kg/m   Intake/Output Summary (Last 24 hours) at 08/28/2021 1239 Last data filed at 08/27/2021 1831 Gross per 24 hour  Intake 830 ml  Output 1500 ml  Net -670 ml     Constitutional: chronically ill appearing. no acute distress. Cardiac: RRR. Pulmonary: unlabored Abdomen: soft, not tender. Vascular: worsening, blistering skin changes in left foot.  CBC Latest Ref Rng & Units 08/28/2021 08/27/2021 08/27/2021  WBC 4.0 - 10.5 K/uL 5.4 6.8 6.1  Hemoglobin 13.0 - 17.0 g/dL 8.4(L) 7.5(L) 7.9(L)  Hematocrit 39.0 - 52.0 % 26.5(L) 24.0(L) 25.9(L)  Platelets 150 - 400 K/uL 317 368 332     CMP Latest Ref Rng & Units 08/27/2021 08/26/2021 08/25/2021  Glucose 70 - 99 mg/dL 104(H) 134(H) 112(H)  BUN 8 - 23 mg/dL 49(H) 31(H) 56(H)  Creatinine 0.61 - 1.24 mg/dL 6.45(H) 4.82(H) 7.80(H)  Sodium 135 - 145 mmol/L 128(L) 131(L) 126(L)  Potassium 3.5 - 5.1 mmol/L 4.1 3.6 4.2   Chloride 98 - 111 mmol/L 92(L) 95(L) 93(L)  CO2 22 - 32 mmol/L 23 27 24   Calcium 8.9 - 10.3 mg/dL 7.2(L) 7.3(L) 6.4(LL)  Total Protein 6.5 - 8.1 g/dL - - -  Total Bilirubin 0.3 - 1.2 mg/dL - - -  Alkaline Phos 38 - 126 U/L - - -  AST 15 - 41 U/L - - -  ALT 0 - 44 U/L - - -    Estimated Creatinine Clearance: 11.2 mL/min (A) (by C-G formula based on SCr of 6.45 mg/dL (H)).  Scott Castro. [name], MD [facility] Phone Number: [phone] 08/28/2021 12:39 PM

## 2021-08-28 NOTE — Progress Notes (Signed)
Contacted cardiology [phone]) at approximately 0830 regarding consult for persistent, intermittent elevated heart rate; history of a. Fib, ESRD, PAD.

## 2021-08-28 NOTE — Progress Notes (Signed)
ANTICOAGULATION CONSULT NOTE - Follow Up Consult  Pharmacy Consult for heparin Indication: atrial fibrillation  Labs: Recent Labs    08/25/21 1639 08/26/21 1004 08/27/21 0515 08/27/21 1514 08/27/21 1830 08/28/21 0441  HGB 7.4* 7.2* 7.9*  --  7.5* 8.4*  HCT 23.8* 23.1* 25.9*  --  24.0* 26.5*  PLT 346 276 332  --  368 317  APTT  --   --  49* 46*  --  47*  HEPARINUNFRC  --   --  >1.10*  --   --  >1.10*  CREATININE 7.80* 4.82*  --   --  6.45*  --     Assessment: 66yo male subtherapeutic on heparin with virtually no change in PTT despite rate increases; no infusion issues or signs of bleeding per RN.  Goal of Therapy:  aPTT 66-102 seconds   Plan:  Will increase heparin infusion by 4 units/kg/hr to 1800 units/hr and check PTT in 8 hours.    Wynona Neat, PharmD, BCPS  08/28/2021,5:44 AM

## 2021-08-28 NOTE — Progress Notes (Signed)
[facility] Progress Note   66 y.o. male PMH COPD Barrett's, hyperparathyroidism, ESRD, failed renal transplant, PAF on Eliquis p/w ischemic foot ulcer -> BKA on 12/30.  Patient receives his dialysis treatments at [place] with [facility].   Dialysis at [facility] with [facility] EDW 68.4kg? EDW before the amputation was 72kg   Elisio 17  Assessment/ Plan:   ESRD - TTS at [facility]/ [facility] - establishing new EDW  - back on TTS schedule; next HD tomorrow - has been tolerating well despite marginally low BPs.  [name]- last Phos 1/1 was 5; 1/16 phos 3.4 [name] 3 tabs TIDM. [name] 60mg  qdaily. Anemia - will dose ESA on Aranesp 62mcg qTues - ^ dose; 10% sats on 1/1--> getting IV iron load with HD. Hbg 7s and was transfused 1/19 with improvement. PAD with critical limb ischemia s/p rt BKA for gangrene - now with new L foot change and plans for eval prior to d/c VVS planning angio today.  PAF - rate controlled on Eliquis typically. On dilt and metop - cont even prior to dialysis.  Was having some RVR yesterday but this is improved.  GERD COPD 8.  Hypotension: started midodrine 1/18.  Infection eval per primary.  Asymptomatic.    9. Dispo: private transport to [facility] HD unit from his [place] home too costly, asked to transfer to [employer] - notified SW of change- working on arrangements  Subjective:    Foot feels terrible, much pain.  Angiogram this AM planned.   Tolerated HD well yesterday - UF was 1.5L. No reported issues.  HR improvd.   BP 104/74 (BP Location: Left Arm)    Pulse (!) 110    Temp 98.7 F (37.1 C)    Resp 18    Ht 5\' 9"  (1.753 m)    Wt 70.4 kg    SpO2 100%    BMI 22.92 kg/m   Intake/Output Summary (Last 24 hours) at 08/28/2021 1147 Last data filed at 08/27/2021 1831 Gross per 24 hour  Intake 830 ml  Output 1500 ml  Net -670 ml    Weight change: -1.2 kg  Physical Exam: GEN: NAD, A&Ox3, NCAT HEENT: MMM LUNGS: CTA B/L  no rales, rhonchi or wheezing CV: irregular irregular HR low 100s, crescendo murmur ABD: SNDNT +BS  EXT:R BKA, L foot with purpura, blistering, partially wrapped ACCESS: Lt BBT--> distal portion is aneurysmal +t/b  Imaging: No results found.  Labs: BMET Recent Labs  Lab 08/25/21 1639 08/26/21 1004 08/27/21 1830  NA 126* 131* 128*  K 4.2 3.6 4.1  CL 93* 95* 92*  CO2 24 27 23   GLUCOSE 112* 134* 104*  BUN 56* 31* 49*  CREATININE 7.80* 4.82* 6.45*  CALCIUM 6.4* 7.3* 7.2*  PHOS  --  2.7 3.5    CBC Recent Labs  Lab 08/26/21 1004 08/27/21 0515 08/27/21 1830 08/28/21 0441  WBC 7.5 6.1 6.8 5.4  NEUTROABS 5.7  --   --   --   HGB 7.2* 7.9* 7.5* 8.4*  HCT 23.1* 25.9* 24.0* 26.5*  MCV 99.1 97.0 98.8 95.3  PLT 276 332 368 317     Medications:     sodium chloride   Intravenous Once   aspirin EC  81 mg Oral Daily   atorvastatin  10 mg Oral QHS   cinacalcet  60 mg Oral Q supper   Darbepoetin Alfa  60 mcg Intravenous Q Tue-HD   diltiazem  120 mg Oral  Daily   gabapentin  100 mg Oral BID   hydrocortisone  25 mg Rectal BID   metoprolol tartrate  12.5 mg Oral BID   midodrine  10 mg Oral TID WC   pantoprazole  40 mg Oral Daily   senna-docusate  2 tablet Oral BID   sertraline  100 mg Oral Daily   sevelamer carbonate  2.4 g Oral TID WC    Jannifer Hick MD [facility] Pager [phone]

## 2021-08-29 DIAGNOSIS — I4819 Other persistent atrial fibrillation: Secondary | ICD-10-CM

## 2021-08-29 DIAGNOSIS — Z7189 Other specified counseling: Secondary | ICD-10-CM

## 2021-08-29 DIAGNOSIS — Z515 Encounter for palliative care: Secondary | ICD-10-CM

## 2021-08-29 DIAGNOSIS — I70222 Atherosclerosis of native arteries of extremities with rest pain, left leg: Secondary | ICD-10-CM

## 2021-08-29 LAB — LIPID PANEL
Cholesterol: 78 mg/dL (ref 0–200)
HDL: 32 mg/dL — ABNORMAL LOW (ref >40)
LDL Cholesterol: 32 mg/dL (ref 0–99)
Total CHOL/HDL Ratio: 2.4 RATIO
Triglycerides: 68 mg/dL (ref <150)
VLDL: 14 mg/dL (ref 0–40)

## 2021-08-29 LAB — CBC
HCT: 26 % — ABNORMAL LOW (ref 39.0–52.0)
Hemoglobin: 8 g/dL — ABNORMAL LOW (ref 13.0–17.0)
MCH: 29.7 pg (ref 26.0–34.0)
MCHC: 30.8 g/dL (ref 30.0–36.0)
MCV: 96.7 fL (ref 80.0–100.0)
Platelets: 294 10*3/uL (ref 150–400)
RBC: 2.69 MIL/uL — ABNORMAL LOW (ref 4.22–5.81)
RDW: 17.2 % — ABNORMAL HIGH (ref 11.5–15.5)
WBC: 6.6 10*3/uL (ref 4.0–10.5)
nRBC: 0 % (ref 0.0–0.2)

## 2021-08-29 LAB — RENAL FUNCTION PANEL
Albumin: 1.9 g/dL — ABNORMAL LOW (ref 3.5–5.0)
Anion gap: 11 (ref 5–15)
BUN: 38 mg/dL — ABNORMAL HIGH (ref 8–23)
CO2: 24 mmol/L (ref 22–32)
Calcium: 7.3 mg/dL — ABNORMAL LOW (ref 8.9–10.3)
Chloride: 98 mmol/L (ref 98–111)
Creatinine, Ser: 5.82 mg/dL — ABNORMAL HIGH (ref 0.61–1.24)
GFR, Estimated: 10 mL/min — ABNORMAL LOW (ref >60)
Glucose, Bld: 144 mg/dL — ABNORMAL HIGH (ref 70–99)
Phosphorus: 3.2 mg/dL (ref 2.5–4.6)
Potassium: 3.7 mmol/L (ref 3.5–5.1)
Sodium: 133 mmol/L — ABNORMAL LOW (ref 135–145)

## 2021-08-29 LAB — APTT
aPTT: 57 seconds — ABNORMAL HIGH (ref 24–36)
aPTT: 86 seconds — ABNORMAL HIGH (ref 24–36)
aPTT: 81 seconds — ABNORMAL HIGH (ref 24–36)

## 2021-08-29 LAB — HEPARIN LEVEL (UNFRACTIONATED): Heparin Unfractionated: >1.1 IU/mL — ABNORMAL HIGH (ref 0.30–0.70)

## 2021-08-29 MED ORDER — DOCUSATE SODIUM 100 MG PO CAPS
100.0000 mg | ORAL_CAPSULE | Freq: Three times a day (TID) | ORAL | Status: DC
  Administered 2021-08-29 – 2021-08-30 (×3): 100 mg via ORAL
  Filled 2021-08-29 (×3): qty 1

## 2021-08-29 MED ORDER — SENNA 8.6 MG PO TABS
2.0000 | ORAL_TABLET | Freq: Every day | ORAL | Status: DC
  Administered 2021-08-29: 17.2 mg via ORAL
  Filled 2021-08-29: qty 2

## 2021-08-29 MED ORDER — OXYCODONE HCL 5 MG PO TABS
10.0000 mg | ORAL_TABLET | ORAL | Status: DC | PRN
Start: 2021-08-29 — End: 2021-08-30
  Administered 2021-08-29 – 2021-08-30 (×3): 10 mg via ORAL
  Filled 2021-08-29 (×3): qty 2

## 2021-08-29 NOTE — Progress Notes (Signed)
Occupational Therapy Session Note  Patient Details  Name: Scott Castro MRN: [id number] Date of Birth: August 24, 1955  Today's Date: 08/30/2021 OT Individual Time: 1100-1200 OT Individual Time Calculation (min): 60 min   Skilled Therapeutic Interventions/Progress Updates:    Per secure chat with MD, no WB/movement restrictions post therapy hold. Pt greeted in bed, motivated to participate. Asking OT for "the 4 pound bar." During bed mobility, we noticed that his blanket was saturated with blood. His IV site was bleeding. RN in during tx to change IV. Discussed pts OT goals at this time with pt reporting that he was motivated to participate in OT and accepting of the need for Lt BKA sometime in the next upcoming weeks. While EOB pt engaged in gentle back stretches to relieve pain with education on technique and importance of coordinating breath with movement. Guided him through gentle spinal twists, flexion/extension, and lateral bends. Afterwards pt participated in B UE therapeutic exercise using the 4# bar x10-15 reps. He needed multiple rest breaks due to fatigue/decreasing 02 sats. Pts sats bounced between 69-100% on supplemental 02 via ear monitor. Rest encouraged until sats increased into the 90s. He remained EOB at close of session, all needs within reach. Nursing made aware that his continuous pulse ox started saying that his sensor was off at time of OT departure.    Therapy Documentation Precautions:  Precautions Precautions: Fall Restrictions Weight Bearing Restrictions: Yes RLE Weight Bearing: Non weight bearing Other Position/Activity Restrictions: s/p R BKA ADL: ADL Eating: Independent Grooming: Setup Upper Body Bathing: Setup Where Assessed-Upper Body Bathing: Sitting at sink Lower Body Bathing: Minimal assistance Where Assessed-Lower Body Bathing: Sitting at sink Upper Body Dressing: Setup Where Assessed-Upper Body Dressing: Wheelchair Lower Body Dressing: Moderate  assistance Where Assessed-Lower Body Dressing: Wheelchair Toileting: Moderate assistance Where Assessed-Toileting: Glass blower/designer: Moderate assistance Toilet Transfer Method: Squat pivot Toilet Transfer Equipment: Raised toilet seat, Grab bars   Therapy/Group: Individual Therapy  Jailynn Lavalais A Rayleen Wyrick 08/30/2021, 1:00 PM

## 2021-08-29 NOTE — Progress Notes (Signed)
Progress Note  Patient Name: Scott Castro Date of Encounter: 08/29/2021  [facility] Cardiologist: None   Subjective   Denies chest pain or dyspnea.  Inpatient Medications    Scheduled Meds:  sodium chloride   Intravenous Once   aspirin EC  81 mg Oral Daily   atorvastatin  10 mg Oral QHS   cinacalcet  60 mg Oral Q supper   Darbepoetin Alfa  60 mcg Intravenous Q Tue-HD   diltiazem  120 mg Oral Daily   docusate sodium  100 mg Oral TID   gabapentin  100 mg Oral BID   hydrocortisone  25 mg Rectal BID   metoprolol tartrate  12.5 mg Oral BID   midodrine  10 mg Oral TID WC   pantoprazole  40 mg Oral Daily   senna  2 tablet Oral QHS   sertraline  100 mg Oral Daily   sevelamer carbonate  2.4 g Oral TID WC   Continuous Infusions:  sodium chloride     sodium chloride     ferric gluconate (FERRLECIT) IVPB 125 mg (08/22/21 2111)   heparin 1,950 Units/hr (08/29/21 0823)   PRN Meds: sodium chloride, acetaminophen, albuterol, bisacodyl, calcium carbonate, diphenhydrAMINE, guaiFENesin-dextromethorphan, milk and molasses, oxyCODONE, prochlorperazine **OR** prochlorperazine **OR** prochlorperazine, sevelamer carbonate   Vital Signs    Vitals:   08/29/21 0416 08/29/21 0815 08/29/21 1039 08/29/21 1204  BP:  95/60 104/83 104/89  Pulse:  (!) 110 (!) 112 (!) 105  Resp:  18 18 20   Temp:  98.2 F (36.8 C) 98.4 F (36.9 C) 98.2 F (36.8 C)  TempSrc:  Oral Oral   SpO2:  100% 100% 100%  Weight: 70.2 kg     Height:        Intake/Output Summary (Last 24 hours) at 08/29/2021 1317 Last data filed at 08/29/2021 0700 Gross per 24 hour  Intake 480 ml  Output --  Net 480 ml   Last 3 Weights 08/29/2021 08/27/2021 08/27/2021  Weight (lbs) 154 lb 12.2 oz 155 lb 3.3 oz 158 lb 8.2 oz  Weight (kg) 70.2 kg 70.4 kg 71.9 kg      Telemetry    Not on telemetry  ECG    No new ECG - Personally Reviewed  Physical Exam   GEN: No acute distress.   Neck: No JVD Cardiac: irregular, 3/6  systolic murmur Respiratory: Clear to auscultation bilaterally. GI: Soft, nontender, non-distended  MS: No edema; No deformity. Neuro:  Nonfocal  Psych: Normal affect   Labs    High Sensitivity Troponin:  No results for input(s): TROPONINIHS in the last 720 hours.   Chemistry Recent Labs  Lab 08/25/21 1639 08/26/21 1004 08/27/21 1830  NA 126* 131* 128*  K 4.2 3.6 4.1  CL 93* 95* 92*  CO2 24 27 23   GLUCOSE 112* 134* 104*  BUN 56* 31* 49*  CREATININE 7.80* 4.82* 6.45*  CALCIUM 6.4* 7.3* 7.2*  ALBUMIN  --  1.8* 1.8*  GFRNONAA 7* 13* 9*  ANIONGAP 9 9 13     Lipids  Recent Labs  Lab 08/29/21 0451  CHOL 78  TRIG 68  HDL 32*  LDLCALC 32  CHOLHDL 2.4    Hematology Recent Labs  Lab 08/27/21 1830 08/28/21 0441 08/29/21 0451  WBC 6.8 5.4 6.6  RBC 2.43* 2.78* 2.69*  HGB 7.5* 8.4* 8.0*  HCT 24.0* 26.5* 26.0*  MCV 98.8 95.3 96.7  MCH 30.9 30.2 29.7  MCHC 31.3 31.7 30.8  RDW 16.0* 17.0* 17.2*  PLT  368 317 294   Thyroid No results for input(s): TSH, FREET4 in the last 168 hours.  BNPNo results for input(s): BNP, PROBNP in the last 168 hours.  DDimer No results for input(s): DDIMER in the last 168 hours.   Radiology    PERIPHERAL VASCULAR CATHETERIZATION  Result Date: 08/28/2021 DATE OF SERVICE: 08/28/2021  PATIENT:  Scott Castro  66 y.o. male  PRE-OPERATIVE DIAGNOSIS:  Atherosclerosis of native arteries of left lower extremity causing ischemic skin changes  POST-OPERATIVE DIAGNOSIS:  Same  PROCEDURE:  1) US guided right common femoral artery access 2) Aortogram 3) Left lower extremity angiogram with second order cannulation (8mL total contrast) 4) Conscious sedation (31 minutes)  SURGEON:  [name], MD  ASSISTANT: none  ANESTHESIA:   local and IV sedation  ESTIMATED BLOOD LOSS: minimal  LOCAL MEDICATIONS USED:  LIDOCAINE  COUNTS: confirmed correct.  PATIENT DISPOSITION:  PACU - hemodynamically stable.  Delay start of Pharmacological VTE agent (>24hrs) due to  surgical blood loss or risk of bleeding: no  INDICATION FOR PROCEDURE: Scott Castro is a 66 y.o. male with ischemic skin changes about the left foot, and non-invasive evidence of peripheral arterial disease. After careful discussion of risks, benefits, and alternatives the patient was offered angiography. The patient understood and wished to proceed.  OPERATIVE FINDINGS: Terminal aorta and iliac arteries: Aorta and iliacs widely patent Renals occluded  Left lower extremity: Common femoral artery: patent without stenosis Profunda femoris artery: patent without stenosis  Superficial femoral artery: patent without stenosis Popliteal artery: patent without stenosis Anterior tibial artery: only tibial vessel; occludes at the ankle. Tibioperoneal trunk: occluded Peroneal artery: occluded Posterior tibial artery: occluded Pedal circulation: fills via collaterals  DESCRIPTION OF PROCEDURE: After identification of the patient in the pre-operative holding area, the patient was transferred to the operating room. The patient was positioned supine on the operating room table. Anesthesia was induced. The groins was prepped and draped in standard fashion. A surgical pause was performed confirming correct patient, procedure, and operative location.  The right groin was anesthetized with subcutaneous injection of 1% lidocaine. Using ultrasound guidance, the right common femoral artery was accessed with micropuncture technique. Fluoroscopy was used to confirm cannulation over the femoral head. The 31F sheath was upsized to 47F.  A Benson wire was advanced into the distal aorta. Over the wire an omni flush catheter was advanced to the level of L2. Aortogram was performed - see above for details.  The left common iliac artery was selected with a benson guidewire. The wire was advanced into the common femoral artery. Over the wire the omni flush catheter was advanced into the external iliac artery. Selective angiography was performed -  see above for details.  Conscious sedation was administered with the use of IV fentanyl and midazolam under continuous physician and nurse monitoring.  Heart rate, blood pressure, and oxygen saturation were continuously monitored.  Total sedation time was 31 minutes.  Upon completion of the case instrument and sharps counts were confirmed correct. The patient was transferred to the PACU in good condition. I was present for all portions of the procedure.  PLAN: No options for revascularization. Will need a left below knee amputation which is not urgent. He is depressed, and wants to speak to a chaplain and palliative care to discuss options and to help with spiritual care. Maximal medical therapy for PAD.  [name], MD [facility] Phone Number: [phone] 08/28/2021 1:34  PM     Cardiac Studies     Patient Profile     66 y.o. male with COPD, hypertension, hyperlipidemia, PAD with critical limb ischemia, atrial fibrillation and end-stage renal disease   Assessment & Plan    Critical limb ischemia- status post right BKA on 08/07/2021 by Dr. Lucky Cowboy.  Angiography 1/20 by Dr. Standley Dakins revealed diffuse infrapopliteal disease on the left with no revascularization options.  The patient will need a left BKA in the near future as well.  Vascular surgery planning left BKA early next week  Atrial fibrillation-history of A. fib on Eliquis.  He is on Cardizem CD 120 and metoprolol twice daily.  His heart rate is recorded in 100-110s range but not on telemetry.  Recommend telemetry. Systolic murmur on exam, will check echo.  The patient is on Eliquis at home, currently on heparin because of recent angiogram and surgery planned  Hyperlipidemia-on low-dose atorvastatin  COPD-on home O2  End-stage renal disease-on hemodialysis for the last 16 years.  Left upper extremity AV fistula.   For questions or updates, please contact [place] Please consult [email or web address]  for contact info under        Signed, Donato Heinz, MD  08/29/2021, 1:17 PM

## 2021-08-29 NOTE — Progress Notes (Addendum)
Progress Note    08/29/2021 10:26 AM 1 Day Post-Op  Subjective:  seems depressed about outcome of left lower extremity but expresses understanding of what is happening and need to move forward   Vitals:   08/29/21 0350 08/29/21 0815  BP: 120/73 95/60  Pulse: (!) 114 (!) 110  Resp: 18 18  Temp: 98.1 F (36.7 C) 98.2 F (36.8 C)  SpO2: 100% 100%   Physical Exam: Cardiac:  tachy Lungs:  non labored Extremities:  right femoral access site c/d/I without swelling or hematoma Neurologic: alert and oriented  CBC    Component Value Date/Time   WBC 6.6 08/29/2021 0451   RBC 2.69 (L) 08/29/2021 0451   HGB 8.0 (L) 08/29/2021 0451   HGB 11.4 (L) 06/11/2012 0501   HCT 26.0 (L) 08/29/2021 0451   HCT 34.6 (L) 06/11/2012 0501   PLT 294 08/29/2021 0451   PLT 251 06/11/2012 0501   MCV 96.7 08/29/2021 0451   MCV 95 06/11/2012 0501   MCH 29.7 08/29/2021 0451   MCHC 30.8 08/29/2021 0451   RDW 17.2 (H) 08/29/2021 0451   RDW 14.4 06/11/2012 0501   LYMPHSABS 0.8 08/26/2021 1004   LYMPHSABS 1.2 06/11/2012 0501   MONOABS 0.9 08/26/2021 1004   MONOABS 0.7 06/11/2012 0501   EOSABS 0.0 08/26/2021 1004   EOSABS 0.2 06/11/2012 0501   BASOSABS 0.0 08/26/2021 1004   BASOSABS 0.1 06/11/2012 0501    BMET    Component Value Date/Time   NA 128 (L) 08/27/2021 1830   NA 142 06/10/2012 0924   K 4.1 08/27/2021 1830   K 4.5 06/16/2013 1315   CL 92 (L) 08/27/2021 1830   CL 102 06/10/2012 0924   CO2 23 08/27/2021 1830   CO2 29 06/10/2012 0924   GLUCOSE 104 (H) 08/27/2021 1830   GLUCOSE 83 06/10/2012 0924   BUN 49 (H) 08/27/2021 1830   BUN 35 (H) 06/10/2012 0924   CREATININE 6.45 (H) 08/27/2021 1830   CREATININE 10.29 (H) 06/10/2012 0924   CALCIUM 7.2 (L) 08/27/2021 1830   CALCIUM 9.2 06/10/2012 0924   GFRNONAA 9 (L) 08/27/2021 1830   GFRNONAA 5 (L) 06/10/2012 0924   GFRAA 9 (L) 09/18/2019 0430   GFRAA 6 (L) 06/10/2012 0924    INR    Component Value Date/Time   INR 1.7 (H)  08/05/2021 1846     Intake/Output Summary (Last 24 hours) at 08/29/2021 1026 Last data filed at 08/29/2021 0700 Gross per 24 hour  Intake 480 ml  Output --  Net 480 ml     Assessment/Plan:  66 y.o. male is s/p Angiogram LLE 1 Day Post-Op   Unfortunately patient has very diminutive flow into his left lower extremity Right femoral access site is clean, dry and intact without swelling or hematoma He has non healing wounds and rest pain in left leg No revascularization options Continue medical therapy for PAD Dr. [name] discussed with patient that recommended amputation for pain control is not urgent Appreciate palliative care seeing patient Patient today expresses wishes to go ahead and move forward with amputation during this admission as he knows having recent RLE amputation that healing process is long and he wants to gain functionality sooner than later Will try to arrange for left BKA in the OR early next week when OR availability   Marval Regal [facility] [phone] 08/29/2021 10:26 AM  I have interviewed the patient and examined the patient. I agree with the findings by the PA.  He is agreeable to proceed with left lower extremity amputation next week.  No hematoma right groin.  Gae Gallop, MD

## 2021-08-29 NOTE — Progress Notes (Signed)
ANTICOAGULATION CONSULT NOTE - Follow Up Consult  Pharmacy Consult for Apixaban >> Heparin Indication: atrial fibrillation  No Known Allergies  Patient Measurements: Height: 5\' 9"  (175.3 cm) Weight: 71.8 kg (158 lb 4.6 oz) IBW/kg (Calculated) : 70.7 Heparin Dosing Weight: 74.6 kg  Vital Signs: Temp: 97.8 F (36.6 C) (01/21 1325) Temp Source: Oral (01/21 1344) BP: 109/68 (01/21 1430) Pulse Rate: 99 (01/21 1430)  Labs: Recent Labs    08/27/21 0515 08/27/21 1514 08/27/21 1830 08/28/21 0441 08/29/21 0451 08/29/21 1400  HGB 7.9*  --  7.5* 8.4* 8.0*  --   HCT 25.9*  --  24.0* 26.5* 26.0*  --   PLT 332  --  368 317 294  --   APTT 49*   < >  --  47* 57* 86*  HEPARINUNFRC >1.10*  --   --  >1.10* >1.10*  --   CREATININE  --   --  6.45*  --   --   --    < > = values in this interval not displayed.    Estimated Creatinine Clearance: 11.3 mL/min (A) (by C-G formula based on SCr of 6.45 mg/dL (H)).   Medications:  Scheduled:   sodium chloride   Intravenous Once   aspirin EC  81 mg Oral Daily   atorvastatin  10 mg Oral QHS   cinacalcet  60 mg Oral Q supper   Darbepoetin Alfa  60 mcg Intravenous Q Tue-HD   diltiazem  120 mg Oral Daily   docusate sodium  100 mg Oral TID   gabapentin  100 mg Oral BID   hydrocortisone  25 mg Rectal BID   metoprolol tartrate  12.5 mg Oral BID   midodrine  10 mg Oral TID WC   pantoprazole  40 mg Oral Daily   senna  2 tablet Oral QHS   sertraline  100 mg Oral Daily   sevelamer carbonate  2.4 g Oral TID WC   Infusions:   sodium chloride     sodium chloride     ferric gluconate (FERRLECIT) IVPB 125 mg (08/22/21 2111)   heparin 1,950 Units/hr (08/29/21 1950)    Assessment: 66 yo M recently had right BKA for PAD. He has been on apixaban for his AF.   He is s/p left angiogram on 1/20 with no options for revascularization and will need a left BKA. Pharmacy consulted to resume IV heparin 4 hrs post sheath removal. Sheath removed intact on 1/20  @ 1342.  Heparin resumed on 1/20 @ 1738. Given recent apixaban use, will monitor anticoagulation using aPTT until aPTT and heparin levels correlate.   aPTT today is therapeutic at 89 on 1950 units/hr. Heparin level is not correlating yet. Hgb 8.0, plt 294. No line issues or signs/symptoms of bleeding noted per RN.  Goal of Therapy:  Heparin level 0.3-0.7 units/ml aPTT 66-102 seconds Monitor platelets by anticoagulation protocol: Yes   Plan:  Continue heparin gtt @ 1950 units/hr. Check ~8 hr aPTT. Daily CBC, heparin level, aPTT. Monitor for signs/symptoms of bleeding.   Vance Peper, PharmD PGY1 Pharmacy Resident Phone [phone] 08/29/2021 3:04 PM   Please check AMION for all [place] phone numbers After 10:00 PM, call [place] [phone]

## 2021-08-29 NOTE — Progress Notes (Signed)
[place] for apixaban>>heparin Indication: atrial fibrillation  No Known Allergies  Patient Measurements: Height: 5\' 9"  (175.3 cm) Weight: 70.2 kg (154 lb 12.2 oz) IBW/kg (Calculated) : 70.7 Heparin Dosing Weight: 73kg  Vital Signs: Temp: 98.1 F (36.7 C) (01/21 0350) BP: 120/73 (01/21 0350) Pulse Rate: 114 (01/21 0350)  Labs: Recent Labs    08/26/21 1004 08/26/21 1004 08/27/21 0515 08/27/21 1514 08/27/21 1830 08/28/21 0441 08/29/21 0451  HGB 7.2*  --  7.9*  --  7.5* 8.4* 8.0*  HCT 23.1*  --  25.9*  --  24.0* 26.5* 26.0*  PLT 276  --  332  --  368 317 294  APTT  --    < > 49* 46*  --  47* 57*  HEPARINUNFRC  --   --  >1.10*  --   --  >1.10* >1.10*  CREATININE 4.82*  --   --   --  6.45*  --   --    < > = values in this interval not displayed.     Estimated Creatinine Clearance: 11.2 mL/min (A) (by C-G formula based on SCr of 6.45 mg/dL (H)).  Assessment: Pt recent had right BKA for PAD. He has been on apixaban for his AF.  He is s/p left angiogram on 1/20 with no options for revascularization and will need a left BKA. Pharmacy consulted to resume IV heparin 4 hrs post sheath removal. Given recent apixaban use, will monitor anticoagulation using aPTT until aPTT and heparin levels correlate.   aPTT remains subtherapeutic at 57 on 1800 units/hr. HL not correlating yet. H/H low stable. Plt wnl. RN reports no s/s of bleeding   Goal of Therapy:  Heparin level 0.3-0.7 units/ml PTT 66-102 Monitor platelets by anticoagulation protocol: Yes   Plan:  Increase heparin to 1950 units/hr  8 hr PTT  Daily PTT, heparin level, CBC Monitor for s/sx of bleeding  Thank you for involving pharmacy in this patient's care.  Albertina Parr, PharmD., BCPS, BCCCP Clinical Pharmacist Please refer to [facility] for unit-specific pharmacist

## 2021-08-29 NOTE — Progress Notes (Signed)
[facility] Progress Note   66 y.o. male PMH COPD Barrett's, hyperparathyroidism, ESRD, failed renal transplant, PAF on Eliquis p/w ischemic foot ulcer -> BKA on 12/30.  Patient receives his dialysis treatments at [place] with [facility].   Dialysis at [facility] with [facility] EDW 68.4kg? EDW before the amputation was 72kg   Elisio 16  Assessment/ Plan:   ESRD - TTS at [facility]/ [facility] - establishing new EDW  - HD today  - Has to move units to [facility] due to transportation s/p amputation - will be MWF there.  Plan is if remains admitted Mon will have HD 1st shift then ok to d/c.  Can d/c tomorrow and go to [facility] if CIR desires d/c.   [name]- last Phos 1/1 was 5; 1/16 phos 3.4 [name] 3 tabs TIDM. [name] 60mg  qdaily. Anemia - will dose ESA on Aranesp 85mcg qTues - ^ dose; 10% sats on 1/1--> getting IV iron load with HD. Hbg 7s and was transfused 1/19 with improvement. PAD with critical limb ischemia s/p rt BKA for gangrene - now will require LBKA s/p angiogram 1/20 showed no intervention possible. PAF - rate controlled and  Eliquis typically. On dilt and metop - cont even prior to dialysis.  Was having some RVR periodically during this admission and cardiology has been consulted - titrating BB and watching BP.  He's on midodrine now too.  GERD COPD 8.  Hypotension: started midodrine 1/18.  Infection eval per primary.  Asymptomatic.  Has been tolerating UF with HD ok to date.    9. Dispo: private transport to [place] HD unit from his [place] too costly, will switch from [name] to [facility].  Ok to start as early as Monday.   Subjective:    Angiogram LLE yesterday, no intervention possible; will need LBKA he tells me in a few weeks.  For HD today then will be MWF at new unit.   BP 104/83 (BP Location: Right Arm)    Pulse (!) 112    Temp 98.4 F (36.9 C) (Oral)    Resp 18    Ht 5\' 9"  (1.753 m)    Wt 70.2 kg    SpO2 100%    BMI 22.85  kg/m   Intake/Output Summary (Last 24 hours) at 08/29/2021 1047 Last data filed at 08/29/2021 0700 Gross per 24 hour  Intake 480 ml  Output --  Net 480 ml    Weight change: -1.7 kg  Physical Exam: GEN: NAD, A&Ox3, NCAT HEENT: MMM LUNGS: CTA B/L no rales, rhonchi or wheezing CV: irregular irregular HR low 100s, crescendo murmur ABD: SNDNT +BS  EXT:R BKA, L foot with purpura, blistering, partially wrapped ACCESS: Lt BBT--> distal portion is aneurysmal +t/b  Imaging: PERIPHERAL VASCULAR CATHETERIZATION  Result Date: 08/28/2021 DATE OF SERVICE: 08/28/2021  PATIENT:  Scott Castro  66 y.o. male  PRE-OPERATIVE DIAGNOSIS:  Atherosclerosis of native arteries of left lower extremity causing ischemic skin changes  POST-OPERATIVE DIAGNOSIS:  Same  PROCEDURE:  1) US guided right common femoral artery access 2) Aortogram 3) Left lower extremity angiogram with second order cannulation (68mL total contrast) 4) Conscious sedation (31 minutes)  SURGEON:  [name], MD  ASSISTANT: none  ANESTHESIA:   local and IV sedation  ESTIMATED BLOOD LOSS: minimal  LOCAL MEDICATIONS USED:  LIDOCAINE  COUNTS: confirmed correct.  PATIENT DISPOSITION:  PACU - hemodynamically stable.  Delay start of Pharmacological VTE agent (>24hrs) due  to surgical blood loss or risk of bleeding: no  INDICATION FOR PROCEDURE: Scott Castro is a 66 y.o. male with ischemic skin changes about the left foot, and non-invasive evidence of peripheral arterial disease. After careful discussion of risks, benefits, and alternatives the patient was offered angiography. The patient understood and wished to proceed.  OPERATIVE FINDINGS: Terminal aorta and iliac arteries: Aorta and iliacs widely patent Renals occluded  Left lower extremity: Common femoral artery: patent without stenosis Profunda femoris artery: patent without stenosis  Superficial femoral artery: patent without stenosis Popliteal artery: patent without stenosis Anterior tibial artery:  only tibial vessel; occludes at the ankle. Tibioperoneal trunk: occluded Peroneal artery: occluded Posterior tibial artery: occluded Pedal circulation: fills via collaterals  DESCRIPTION OF PROCEDURE: After identification of the patient in the pre-operative holding area, the patient was transferred to the operating room. The patient was positioned supine on the operating room table. Anesthesia was induced. The groins was prepped and draped in standard fashion. A surgical pause was performed confirming correct patient, procedure, and operative location.  The right groin was anesthetized with subcutaneous injection of 1% lidocaine. Using ultrasound guidance, the right common femoral artery was accessed with micropuncture technique. Fluoroscopy was used to confirm cannulation over the femoral head. The 51F sheath was upsized to 90F.  A Benson wire was advanced into the distal aorta. Over the wire an omni flush catheter was advanced to the level of L2. Aortogram was performed - see above for details.  The left common iliac artery was selected with a benson guidewire. The wire was advanced into the common femoral artery. Over the wire the omni flush catheter was advanced into the external iliac artery. Selective angiography was performed - see above for details.  Conscious sedation was administered with the use of IV fentanyl and midazolam under continuous physician and nurse monitoring.  Heart rate, blood pressure, and oxygen saturation were continuously monitored.  Total sedation time was 31 minutes.  Upon completion of the case instrument and sharps counts were confirmed correct. The patient was transferred to the PACU in good condition. I was present for all portions of the procedure.  PLAN: No options for revascularization. Will need a left below knee amputation which is not urgent. He is depressed, and wants to speak to a chaplain and palliative care to discuss options and to help with spiritual care. Maximal medical  therapy for PAD.  [name], MD [facility] Phone Number: [phone] 08/28/2021 1:34 PM     Labs: BMET Recent Labs  Lab 08/25/21 1639 08/26/21 1004 08/27/21 1830  NA 126* 131* 128*  K 4.2 3.6 4.1  CL 93* 95* 92*  CO2 24 27 23   GLUCOSE 112* 134* 104*  BUN 56* 31* 49*  CREATININE 7.80* 4.82* 6.45*  CALCIUM 6.4* 7.3* 7.2*  PHOS  --  2.7 3.5    CBC Recent Labs  Lab 08/26/21 1004 08/27/21 0515 08/27/21 1830 08/28/21 0441 08/29/21 0451  WBC 7.5 6.1 6.8 5.4 6.6  NEUTROABS 5.7  --   --   --   --   HGB 7.2* 7.9* 7.5* 8.4* 8.0*  HCT 23.1* 25.9* 24.0* 26.5* 26.0*  MCV 99.1 97.0 98.8 95.3 96.7  PLT 276 332 368 317 294     Medications:     sodium chloride   Intravenous Once   aspirin EC  81 mg Oral Daily   atorvastatin  10 mg Oral QHS   cinacalcet  60  mg Oral Q supper   Darbepoetin Alfa  60 mcg Intravenous Q Tue-HD   diltiazem  120 mg Oral Daily   docusate sodium  100 mg Oral TID   gabapentin  100 mg Oral BID   hydrocortisone  25 mg Rectal BID   metoprolol tartrate  12.5 mg Oral BID   midodrine  10 mg Oral TID WC   pantoprazole  40 mg Oral Daily   senna  2 tablet Oral QHS   sertraline  100 mg Oral Daily   sevelamer carbonate  2.4 g Oral TID WC    Jannifer Hick MD [facility] Pager [phone]

## 2021-08-29 NOTE — Consult Note (Signed)
Palliative Medicine Inpatient Consult Note  Consulting Provider: Cherre Robins, MD  Reason for consult:   [name] Palliative Medicine Consult  Reason for Consult? difficult decisions; patient needs left below knee amputation. He is having trouble adjusting, and wants help with decision making. he has no family.   HPI:  Per intake H&P --> Scott Castro is a 66 y.o. male with a history of ESRD on dialysis TThS, COPD on 2-3LNC, atrial fibrillation. Admitted for right lower extremity pain has had a right lower extremity BKA.  At this time patient has impaired vascular return to his left lower extremity and is being considered for a left BKA.  Palliative care has been asked to get involved to further define goals of care and identify if patient would like to proceed with the surgical option.  Clinical Assessment/Goals of Care:  *Please note that this is a verbal dictation therefore any spelling or grammatical errors are due to the "[place] One" system interpretation.  I have reviewed medical records including EPIC notes, labs and imaging, received report from bedside RN, assessed the patient who is sitting in bed in no acute distress.    I met with Scott Castro to further discuss diagnosis prognosis, GOC, EOL wishes, disposition and options.   I introduced Palliative Medicine as specialized medical care for people living with serious illness. It focuses on providing relief from the symptoms and stress of a serious illness. The goal is to improve quality of life for both the patient and the family.  Medical History Review and Understanding:  Scott Castro shares with me that he has a history of end-stage renal disease and is on dialysis through [facility] in [place].  We discussed his history of atrial fibrillation and COPD for which she is on oxygen.  Review Scott Castro peripheral vascular disease and his need for a recent right BKA.  He shares with me disappointment at the  rapid nature of his left lower extremities impairment.  He reviews that per discussion with vascular surgery he will now have to lose his left extremity.  He shares that this is a lot to internalize.  Social History:  Scott Castro is from [place] originally.  He has lived in the [place] area for the past 34 years.  He has never been married and does not have children.  He was working up until 3 weeks ago third shift at [employer].  He expresses that he loves working and finds meaning in the work that he does.  He also shares that his social circle is through his job.  He used to do like to gamble 1 time monthly and watch NFL games.  He does not go to church.  Functional and Nutritional State:  Scott Castro was fully functional prior to hospital admission living in a single-family home able to do and perform all B ADLs and IADLs without assistance.  He shares that he has a cousin who he is close with, a neighbor, and some friends from his job.  We reviewed the transition that his life is taken in such a short period of time in the setting of his amputation and now another encroaching amputation.  Tripton shares a feeling of loss and that he no longer will be able to work and he will need to go through a great deal of struggle to get to a point where he can be fitted for and mobilize on prosthetic devices.  Advance Directives: A detailed discussion was had  today regarding advanced directives.  Scott Castro shares that he has done a healthcare power of attorney and living well.  He expresses that this is at the [facility] in [place].  Code Status: At this time Scott Castro feels strongly that he would want all interventions to continue the pursuit of life.  He shares that he would not want to be on long-term measures if he had impairment to the degree where he would not be functional again.  Goals for the Future:  Ideally to be fitted for bilateral prosthetics and be able to mobilize,  drive, and engage with family and friends as he was prior.  Discussion:  I shared with Scott Castro the differences between pursuing aggressive measures and not.  Paymon is aware that if he does not pursue his left lower extremity amputation that he will end up having gangrene and then total body sepsis.  He realizes that that would be end-of-life for him.  He shares that he is not ready for his life to and and although he is very unhappy with the decision he knows he has to pursue the amputation.  Discussed the importance of continued conversation with family and their  medical providers regarding overall plan of care and treatment options, ensuring decisions are within the context of the patients values and GOCs.  Decision Maker: Scott Castro can make decisions for himself though if he were ever incapacitated for any reason he would rely on his loved one Scott Castro to be his primary Media planner.  SUMMARY OF RECOMMENDATIONS   Full Code/ Full Scope of Care  Although patient is not happy with the decision he realizes he must pursue his left lower extremity amputation  Goals are to be able to in the future be fitted for prosthetics and regain functionality  Will obtain a copy of patient's advanced directives from [facility] in [place] support given existential distress patient is experiencing  Appreciate transitions of care team helping patient with some of his navigational questions about what insurance will and will not cover  Ongoing palliative care support  Code Status/Advance Care Planning: FULL CODE  Palliative Prophylaxis:  Aspiration, Bowel Regimen, Delirium Protocol, Frequent Pain Assessment, Oral Care, Palliative Wound Care, and Turn Reposition  Additional Recommendations (Limitations, Scope, Preferences):  Full Scope Treatment,   Psycho-social/Spiritual:  Desire for further Chaplaincy support: Yes Additional Recommendations: Education on peripheral vascular  disease   Prognosis: Unclear  Discharge Planning: Discharge will likely be to a long-term care facility  Vitals:   08/29/21 0350 08/29/21 0815  BP: 120/73 95/60  Pulse: (!) 114 (!) 110  Resp: 18 18  Temp: 98.1 F (36.7 C) 98.2 F (36.8 C)  SpO2: 100% 100%    Intake/Output Summary (Last 24 hours) at 08/29/2021 0930 Last data filed at 08/29/2021 0700 Gross per 24 hour  Intake 480 ml  Output --  Net 480 ml   Last Weight  Most recent update: 08/29/2021  4:16 AM    Weight  70.2 kg (154 lb 12.2 oz)            Gen: 3 Caucasian male in no acute distress HEENT: moist mucous membranes CV: Irregular rate and rhythm PULM: On 2 L nasal cannula breathing is even and nonlabored ABD: soft/nontender EXT: Right BKA Neuro: Alert and oriented x3  PPS: 40-50%   This conversation/these recommendations were discussed with patient primary care team, Dr. Dagoberto Ligas and [name]  MDM High  Medical Decision Making: 4 #/Complex Problems: 4  Data Reviewed: 4                Management: 4 (1-Straightforward, 2-Low, 3-Moderate, 4-High) ______________________________________________________ [place] Team Team Cell Phone: [phone] Please utilize secure chat with additional questions, if there is no response within 30 minutes please call the above phone number  Palliative Medicine Team providers are available by phone from 7am to 7pm daily and can be reached through the team cell phone.  Should this patient require assistance outside of these hours, please call the patient's attending physician.

## 2021-08-29 NOTE — Progress Notes (Signed)
ANTICOAGULATION CONSULT NOTE - Follow Up Consult  Pharmacy Consult for Heparin (Apixaban on hold) Indication: atrial fibrillation  No Known Allergies  Patient Measurements: Height: 5\' 9"  (175.3 cm) Weight: 70.3 kg (154 lb 15.7 oz) IBW/kg (Calculated) : 70.7 Heparin Dosing Weight: 70 kg  Vital Signs: Temp: 98 F (36.7 C) (01/21 2002) Temp Source: Oral (01/21 2002) BP: 115/87 (01/21 2002) Pulse Rate: 97 (01/21 2002)  Labs: Recent Labs    08/27/21 0515 08/27/21 1514 08/27/21 1830 08/28/21 0441 08/29/21 0451 08/29/21 1400 08/29/21 1629 08/29/21 2132  HGB 7.9*  --  7.5* 8.4* 8.0*  --   --   --   HCT 25.9*  --  24.0* 26.5* 26.0*  --   --   --   PLT 332  --  368 317 294  --   --   --   APTT 49*   < >  --  47* 57* 86*  --  81*  HEPARINUNFRC >1.10*  --   --  >1.10* >1.10*  --   --   --   CREATININE  --   --  6.45*  --   --   --  5.82*  --    < > = values in this interval not displayed.   ESRD  Assessment: 66 yo M recently had right BKA for PAD. He has been on apixaban for atrial fibrillation.   He is s/p left angiogram on 1/20 with no options for revascularization and will need a left BKA. Pharmacy consulted to resume IV heparin 4 hrs post sheath removal on 1/20. Sheath removed intact on 1/20 @ 1342. Heparin resumed on 1/20 @ 1738. Given recent apixaban use, will monitor anticoagulation using aPTT until aPTT and heparin levels correlate.    - aPTT remains therapeutic (81 seconds) on 1950 units/hr.  Goal of Therapy:  Heparin level 0.3-0.7 units/ml aPTT 66-102 seconds Monitor platelets by anticoagulation protocol: Yes   Plan:  Continue heparin drip at 1950 units/hr Next aPTT, heparin level and CBC in am. Apixaban on hold for procedure  Arty Baumgartner, RPh 08/29/2021,10:30 PM

## 2021-08-29 NOTE — Progress Notes (Signed)
PROGRESS NOTE   Subjective/Complaints:  Alongside vascular discussed their recommendation of amputation with him, and his preferences regarding timeline of procedure- he prefers sooner rather than later to help minimize his pain   ROS:   Pt denies SOB, abd pain, CP, N/V/C/D, and vision changes, +lower extremity pain   Objective:   PERIPHERAL VASCULAR CATHETERIZATION  Result Date: 08/28/2021 DATE OF SERVICE: 08/28/2021  PATIENT:  Scott Castro  66 y.o. male  PRE-OPERATIVE DIAGNOSIS:  Atherosclerosis of native arteries of left lower extremity causing ischemic skin changes  POST-OPERATIVE DIAGNOSIS:  Same  PROCEDURE:  1) US guided right common femoral artery access 2) Aortogram 3) Left lower extremity angiogram with second order cannulation (62mL total contrast) 4) Conscious sedation (31 minutes)  SURGEON:  [name], MD  ASSISTANT: none  ANESTHESIA:   local and IV sedation  ESTIMATED BLOOD LOSS: minimal  LOCAL MEDICATIONS USED:  LIDOCAINE  COUNTS: confirmed correct.  PATIENT DISPOSITION:  PACU - hemodynamically stable.  Delay start of Pharmacological VTE agent (>24hrs) due to surgical blood loss or risk of bleeding: no  INDICATION FOR PROCEDURE: Scott Castro is a 66 y.o. male with ischemic skin changes about the left foot, and non-invasive evidence of peripheral arterial disease. After careful discussion of risks, benefits, and alternatives the patient was offered angiography. The patient understood and wished to proceed.  OPERATIVE FINDINGS: Terminal aorta and iliac arteries: Aorta and iliacs widely patent Renals occluded  Left lower extremity: Common femoral artery: patent without stenosis Profunda femoris artery: patent without stenosis  Superficial femoral artery: patent without stenosis Popliteal artery: patent without stenosis Anterior tibial artery: only tibial vessel; occludes at the ankle. Tibioperoneal trunk: occluded Peroneal  artery: occluded Posterior tibial artery: occluded Pedal circulation: fills via collaterals  DESCRIPTION OF PROCEDURE: After identification of the patient in the pre-operative holding area, the patient was transferred to the operating room. The patient was positioned supine on the operating room table. Anesthesia was induced. The groins was prepped and draped in standard fashion. A surgical pause was performed confirming correct patient, procedure, and operative location.  The right groin was anesthetized with subcutaneous injection of 1% lidocaine. Using ultrasound guidance, the right common femoral artery was accessed with micropuncture technique. Fluoroscopy was used to confirm cannulation over the femoral head. The 50F sheath was upsized to 16F.  A Benson wire was advanced into the distal aorta. Over the wire an omni flush catheter was advanced to the level of L2. Aortogram was performed - see above for details.  The left common iliac artery was selected with a benson guidewire. The wire was advanced into the common femoral artery. Over the wire the omni flush catheter was advanced into the external iliac artery. Selective angiography was performed - see above for details.  Conscious sedation was administered with the use of IV fentanyl and midazolam under continuous physician and nurse monitoring.  Heart rate, blood pressure, and oxygen saturation were continuously monitored.  Total sedation time was 31 minutes.  Upon completion of the case instrument and sharps counts were confirmed correct. The patient was transferred to the PACU in good condition. I was present for all portions of the  procedure.  PLAN: No options for revascularization. Will need a left below knee amputation which is not urgent. He is depressed, and wants to speak to a chaplain and palliative care to discuss options and to help with spiritual care. Maximal medical therapy for PAD.  [name], MD [facility] Phone Number: [phone] 08/28/2021 1:34 PM    Recent Labs    08/28/21 0441 08/29/21 0451  WBC 5.4 6.6  HGB 8.4* 8.0*  HCT 26.5* 26.0*  PLT 317 294    Recent Labs    08/27/21 1830 08/29/21 1629  NA 128* 133*  K 4.1 3.7  CL 92* 98  CO2 23 24  GLUCOSE 104* 144*  BUN 49* 38*  CREATININE 6.45* 5.82*  CALCIUM 7.2* 7.3*     Intake/Output Summary (Last 24 hours) at 08/29/2021 1907 Last data filed at 08/29/2021 1344 Gross per 24 hour  Intake 400 ml  Output --  Net 400 ml     Pressure Injury 08/26/21 Heel Left Deep Tissue Pressure Injury - Purple or maroon localized area of discolored intact skin or blood-filled blister due to damage of underlying soft tissue from pressure and/or shear. (Active)  08/26/21 1826  Location: Heel  Location Orientation: Left  Staging: Deep Tissue Pressure Injury - Purple or maroon localized area of discolored intact skin or blood-filled blister due to damage of underlying soft tissue from pressure and/or shear.  Wound Description (Comments):   Present on Admission: No    Physical Exam: Vital Signs Blood pressure 105/72, pulse 66, temperature 98.4 F (36.9 C), temperature source Oral, resp. rate 16, height 5\' 9"  (1.753 m), weight 71.8 kg, SpO2 100 %. Gen: no distress, normal appearing HEENT: oral mucosa pink and moist, NCAT Cardio: Reg rate Chest: normal effort, normal rate of breathing Abd: soft, non-distended Ext: no edema Psych: pleasant, normal affect Skin: intact  Skin: mottled appearance of L foot from arch to toes and also on bottom of foot to base of MTPs- looks worse with significant blisters rasing up on dorsum of L foot-  R BKA- ecchymoses is spreading to around incision somewhat on RLE. Looks similar to yesterday.         Musculoskeletal:  LLE a few muscle spasms seen- not spasticity based on no increased tone  Extremities- LLE more swollen this AM- 2+ LLE edema      Assessment/Plan: 1.  Functional deficits which require 3+ hours per day of interdisciplinary therapy in a comprehensive inpatient rehab setting. Physiatrist is providing close team supervision and 24 hour management of active medical problems listed below. Physiatrist and rehab team continue to assess barriers to discharge/monitor patient progress toward functional and medical goals  Care Tool:  Bathing  Bathing activity did not occur: Safety/medical concerns Body parts bathed by patient: Right arm, Left arm, Abdomen, Chest, Right upper leg, Left upper leg, Face, Front perineal area, Buttocks   Body parts bathed by helper: Left lower leg Body parts n/a: Right lower leg   Bathing assist Assist Level: Minimal Assistance - Patient > 75%     Upper Body Dressing/Undressing Upper body dressing Upper body dressing/undressing activity did not occur (including orthotics): Safety/medical concerns What is the patient wearing?: Pull over shirt    Upper body assist Assist Level: Set up assist    Lower Body Dressing/Undressing Lower body dressing      What is the patient wearing?: Pants, Underwear/pull up     Lower body assist Assist for  lower body dressing: Minimal Assistance - Patient > 75%     Toileting Toileting    Toileting assist Assist for toileting: Contact Guard/Touching assist     Transfers Chair/bed transfer  Transfers assist     Chair/bed transfer assist level: Contact Guard/Touching assist     Locomotion Ambulation   Ambulation assist   Ambulation activity did not occur: Safety/medical concerns  Assist level: Contact Guard/Touching assist Assistive device: Walker-rolling Max distance: 20   Walk 10 feet activity   Assist  Walk 10 feet activity did not occur: Safety/medical concerns  Assist level: Contact Guard/Touching assist Assistive device: Walker-rolling   Walk 50 feet activity   Assist Walk 50 feet with 2 turns activity did not occur: Safety/medical concerns          Walk 150 feet activity   Assist Walk 150 feet activity did not occur: Safety/medical concerns         Walk 10 feet on uneven surface  activity   Assist Walk 10 feet on uneven surfaces activity did not occur: Safety/medical concerns         Wheelchair     Assist Is the patient using a wheelchair?: Yes Type of Wheelchair: Manual    Wheelchair assist level: Supervision/Verbal cueing Max wheelchair distance: 250    Wheelchair 50 feet with 2 turns activity    Assist        Assist Level: Supervision/Verbal cueing   Wheelchair 150 feet activity     Assist      Assist Level: Supervision/Verbal cueing   Blood pressure 105/72, pulse 66, temperature 98.4 F (36.9 C), temperature source Oral, resp. rate 16, height 5\' 9"  (1.753 m), weight 71.8 kg, SpO2 100 %.  Medical Problem List and Plan: 1. Functional deficits secondary to recent right below the knee amputation secondary to critical limb ischemia, peripheral arterial disease, severe right lower extremity tibial artery disease.             -patient may shower but incision must be covered.              -ELOS/Goals: 1/18,   Supervision        con't CIR- PT and OT- pt says cannot get ramp by 1/18 and needs to extend stay- will d/w team at team conference today  1/20- con't CIR_ on hold today- has ramp now- arteriogram today.   1/21: patient will need amputation- discussed with vascular and patient and he prefers as soon as possible- plan for early next week 2.  Antithrombotics: -DVT/anticoagulation:  Mechanical: Sequential compression devices, below knee Left lower extremity  1/19- on Heparin gtt- Eliquis held for arteriogram.  Pharmaceutical: Other (comment) apixaban             -antiplatelet therapy: aspirin  3. Pain Management:  .  1/5-changed Norco to Oxycodone 5-10 mg q4 hours prn- s  1/13- pain seems controlled. Have discussed massage, vision rx   -he's tolerating shrinker 11/17- only taking  1-2x/day- con't regimen  1/19- having more pain- was resting LLE off bed- more swollen- asked him not to.   1/20- hadn't taken pain meds yet this Am- encouraged pt to do so.  1/21: changed oxycodone to 10mg  q4H prn as patient feels higher dose is more helpful to him.  4. Mood: LCSW to evaluate and provide emotional support             -antipsychotic agents: Not applicable 5. Neuropsych: This patient is capable of making decisions on his own  behalf. 6. Skin/Wound Care: tolerating shrinker ,using limb guard  -continue shrinker, +dressing if needed as he has occasional drainage No drainage, + edema, ecchymosis unchanged   1/19- R BKA having skin slough- also LLE looks worse- going for arteriogram tomorrow  1/20- arteriogram today- skin looks worse than yesterday 7. Fluids/Electrolytes/Nutrition: Fluid restriction, monitor intake and daily weight 8.  End-stage renal disease on chronic intermittent hemodialysis.  T/T/S -HD at end of day to avoid conflicting with therapy -volume mgt per nephrology team, has had issues with hypotension a few times after HD 1/19- HD today- will see if can get transfusion of 1 unit- HR 120s- could be be made worse by anemia.  9: Atrial fibrillation: Continue Lopressor, Eliquis  1/17- rate controlled- con't regimen  1/20- HR elevated- in RVR slightly- has received diltiazem for last 24 hours- will call Cards.  10: Hyperlipidemia: Continue Lipitor 11: COPD/asthma: albuterol neb PRN  -1/13 pO2 61 on ABG yesterday   -will need to go home on oxygen   -apparently has some equipment per above  1/16- Is on Home O2- will need to go home on Home O2- concerned about documentation of O2 sats of 60% this afternoon- on RA_ asked nursing to go eval pt and make sure if it's correct or not- and call me back if any issues  1/17- O2 drop yesterday was due to cold hands- sats in 90s- rewrote orders for home O2. 1/20- O2 levels dropping when takes O2 off.   12: Renal osteodystrophy:  Renvela and Sensipar as per nephrology 13: Barrett's Esophagus: Continue PPI 14: Anemia, iron deficiency/chronic disease: Aransep and Venofer per nephrology. 15: Peripheral arterial disease s/p right BKA on 12/30.  --Was discussed with Dr. Delana Meyer (on call vascular surgeon for Dr. Lucky Cowboy) regarding antiplatelet therapy>>recommends aspirin 81 mg daily --Follow-up with Dr. Lucky Cowboy as outpatient  59. Depression 1/5- started Zoloft 50 mg daily for reactive depression 1/17- much brighter affect, but did mention will NOT go through another amputation.   1/19- Tearful- increased Zoloft to 100 mg daily 17. Low grade temp-   1/18- Labs look ok, no leukocytosis 18. Mottling of L foot due to PAD  1/18- vascualr taking for arteriogram Friday- on Heparin gtt- cannot do tx dose lovenox due to ESRD  1/19- arteriogram tomorrow- looking worse daily-  19. Hypotension  1/18- asking Renal if can do midodrine? 1/19- midodrine started yesterday- 10 mg TID_ working better 1/21: BP now fluctuation: continue midodrine 20. Constipation: change colace to TID and senna to 2 tabs HS   LOS: 17 days A FACE TO FACE EVALUATION WAS PERFORMED  Martha Clan P Ziare Cryder 08/29/2021, 7:07 PM

## 2021-08-30 ENCOUNTER — Inpatient Hospital Stay (HOSPITAL_COMMUNITY)
Admission: RE | Admit: 2021-08-30 | Discharge: 2021-09-16 | DRG: 239 | Disposition: A | Discharge status: Skilled Nursing Facility | Payer: Medicare Other | Source: Intra-hospital | Attending: Internal Medicine | Admitting: Internal Medicine

## 2021-08-30 ENCOUNTER — Inpatient Hospital Stay (HOSPITAL_COMMUNITY): Payer: Medicare Other

## 2021-08-30 DIAGNOSIS — T8612 Kidney transplant failure: Secondary | ICD-10-CM | POA: Diagnosis present

## 2021-08-30 DIAGNOSIS — I471 Supraventricular tachycardia: Secondary | ICD-10-CM | POA: Diagnosis present

## 2021-08-30 DIAGNOSIS — I272 Pulmonary hypertension, unspecified: Secondary | ICD-10-CM | POA: Diagnosis present

## 2021-08-30 DIAGNOSIS — Z79899 Other long term (current) drug therapy: Secondary | ICD-10-CM

## 2021-08-30 DIAGNOSIS — N2581 Secondary hyperparathyroidism of renal origin: Secondary | ICD-10-CM | POA: Diagnosis present

## 2021-08-30 DIAGNOSIS — J9811 Atelectasis: Secondary | ICD-10-CM | POA: Diagnosis present

## 2021-08-30 DIAGNOSIS — J449 Chronic obstructive pulmonary disease, unspecified: Secondary | ICD-10-CM | POA: Diagnosis present

## 2021-08-30 DIAGNOSIS — J9611 Chronic respiratory failure with hypoxia: Secondary | ICD-10-CM | POA: Diagnosis not present

## 2021-08-30 DIAGNOSIS — Y830 Surgical operation with transplant of whole organ as the cause of abnormal reaction of the patient, or of later complication, without mention of misadventure at the time of the procedure: Secondary | ICD-10-CM | POA: Diagnosis present

## 2021-08-30 DIAGNOSIS — E43 Unspecified severe protein-calorie malnutrition: Secondary | ICD-10-CM | POA: Diagnosis present

## 2021-08-30 DIAGNOSIS — R230 Cyanosis: Secondary | ICD-10-CM | POA: Diagnosis present

## 2021-08-30 DIAGNOSIS — N186 End stage renal disease: Secondary | ICD-10-CM | POA: Diagnosis present

## 2021-08-30 DIAGNOSIS — K5900 Constipation, unspecified: Secondary | ICD-10-CM | POA: Diagnosis not present

## 2021-08-30 DIAGNOSIS — K22719 Barrett's esophagus with dysplasia, unspecified: Secondary | ICD-10-CM | POA: Diagnosis not present

## 2021-08-30 DIAGNOSIS — I4892 Unspecified atrial flutter: Secondary | ICD-10-CM | POA: Diagnosis present

## 2021-08-30 DIAGNOSIS — I4819 Other persistent atrial fibrillation: Secondary | ICD-10-CM | POA: Diagnosis not present

## 2021-08-30 DIAGNOSIS — I70222 Atherosclerosis of native arteries of extremities with rest pain, left leg: Secondary | ICD-10-CM | POA: Diagnosis not present

## 2021-08-30 DIAGNOSIS — K2270 Barrett's esophagus without dysplasia: Secondary | ICD-10-CM | POA: Diagnosis present

## 2021-08-30 DIAGNOSIS — J9621 Acute and chronic respiratory failure with hypoxia: Secondary | ICD-10-CM | POA: Diagnosis present

## 2021-08-30 DIAGNOSIS — I120 Hypertensive chronic kidney disease with stage 5 chronic kidney disease or end stage renal disease: Secondary | ICD-10-CM | POA: Diagnosis present

## 2021-08-30 DIAGNOSIS — D631 Anemia in chronic kidney disease: Secondary | ICD-10-CM | POA: Diagnosis present

## 2021-08-30 DIAGNOSIS — E889 Metabolic disorder, unspecified: Secondary | ICD-10-CM | POA: Diagnosis present

## 2021-08-30 DIAGNOSIS — K219 Gastro-esophageal reflux disease without esophagitis: Secondary | ICD-10-CM | POA: Diagnosis present

## 2021-08-30 DIAGNOSIS — I083 Combined rheumatic disorders of mitral, aortic and tricuspid valves: Secondary | ICD-10-CM | POA: Diagnosis present

## 2021-08-30 DIAGNOSIS — Z77090 Contact with and (suspected) exposure to asbestos: Secondary | ICD-10-CM | POA: Diagnosis present

## 2021-08-30 DIAGNOSIS — Z6820 Body mass index (BMI) 20.0-20.9, adult: Secondary | ICD-10-CM

## 2021-08-30 DIAGNOSIS — E871 Hypo-osmolality and hyponatremia: Secondary | ICD-10-CM | POA: Diagnosis present

## 2021-08-30 DIAGNOSIS — Y835 Amputation of limb(s) as the cause of abnormal reaction of the patient, or of later complication, without mention of misadventure at the time of the procedure: Secondary | ICD-10-CM | POA: Diagnosis present

## 2021-08-30 DIAGNOSIS — Z20822 Contact with and (suspected) exposure to covid-19: Secondary | ICD-10-CM | POA: Diagnosis present

## 2021-08-30 DIAGNOSIS — Z66 Do not resuscitate: Secondary | ICD-10-CM | POA: Diagnosis not present

## 2021-08-30 DIAGNOSIS — T8753 Necrosis of amputation stump, right lower extremity: Secondary | ICD-10-CM | POA: Diagnosis present

## 2021-08-30 DIAGNOSIS — I70223 Atherosclerosis of native arteries of extremities with rest pain, bilateral legs: Secondary | ICD-10-CM | POA: Diagnosis present

## 2021-08-30 DIAGNOSIS — E782 Mixed hyperlipidemia: Secondary | ICD-10-CM | POA: Diagnosis present

## 2021-08-30 DIAGNOSIS — I480 Paroxysmal atrial fibrillation: Secondary | ICD-10-CM | POA: Diagnosis present

## 2021-08-30 DIAGNOSIS — Z940 Kidney transplant status: Secondary | ICD-10-CM | POA: Diagnosis not present

## 2021-08-30 DIAGNOSIS — Z992 Dependence on renal dialysis: Secondary | ICD-10-CM

## 2021-08-30 DIAGNOSIS — Z89511 Acquired absence of right leg below knee: Secondary | ICD-10-CM

## 2021-08-30 DIAGNOSIS — Z7189 Other specified counseling: Secondary | ICD-10-CM | POA: Diagnosis not present

## 2021-08-30 DIAGNOSIS — I4891 Unspecified atrial fibrillation: Secondary | ICD-10-CM | POA: Diagnosis not present

## 2021-08-30 DIAGNOSIS — I3139 Other pericardial effusion (noninflammatory): Secondary | ICD-10-CM | POA: Diagnosis present

## 2021-08-30 DIAGNOSIS — J90 Pleural effusion, not elsewhere classified: Secondary | ICD-10-CM | POA: Diagnosis present

## 2021-08-30 DIAGNOSIS — T8789 Other complications of amputation stump: Secondary | ICD-10-CM | POA: Diagnosis present

## 2021-08-30 DIAGNOSIS — Z8616 Personal history of COVID-19: Secondary | ICD-10-CM | POA: Diagnosis not present

## 2021-08-30 DIAGNOSIS — R011 Cardiac murmur, unspecified: Secondary | ICD-10-CM | POA: Diagnosis not present

## 2021-08-30 DIAGNOSIS — Z8249 Family history of ischemic heart disease and other diseases of the circulatory system: Secondary | ICD-10-CM

## 2021-08-30 DIAGNOSIS — D649 Anemia, unspecified: Secondary | ICD-10-CM | POA: Diagnosis present

## 2021-08-30 DIAGNOSIS — F329 Major depressive disorder, single episode, unspecified: Secondary | ICD-10-CM | POA: Diagnosis present

## 2021-08-30 DIAGNOSIS — T8751 Necrosis of amputation stump, right upper extremity: Secondary | ICD-10-CM | POA: Diagnosis not present

## 2021-08-30 DIAGNOSIS — Z7901 Long term (current) use of anticoagulants: Secondary | ICD-10-CM

## 2021-08-30 DIAGNOSIS — I10 Essential (primary) hypertension: Secondary | ICD-10-CM | POA: Diagnosis not present

## 2021-08-30 DIAGNOSIS — R0902 Hypoxemia: Secondary | ICD-10-CM

## 2021-08-30 DIAGNOSIS — Z515 Encounter for palliative care: Secondary | ICD-10-CM | POA: Diagnosis not present

## 2021-08-30 DIAGNOSIS — I70262 Atherosclerosis of native arteries of extremities with gangrene, left leg: Secondary | ICD-10-CM | POA: Diagnosis not present

## 2021-08-30 DIAGNOSIS — E213 Hyperparathyroidism, unspecified: Secondary | ICD-10-CM | POA: Diagnosis present

## 2021-08-30 LAB — APTT
aPTT: 56 seconds — ABNORMAL HIGH (ref 24–36)
aPTT: 66 seconds — ABNORMAL HIGH (ref 24–36)

## 2021-08-30 LAB — CBC
HCT: 33.1 % — ABNORMAL LOW (ref 39.0–52.0)
Hemoglobin: 10.4 g/dL — ABNORMAL LOW (ref 13.0–17.0)
MCH: 30.7 pg (ref 26.0–34.0)
MCHC: 31.4 g/dL (ref 30.0–36.0)
MCV: 97.6 fL (ref 80.0–100.0)
Platelets: 287 10*3/uL (ref 150–400)
RBC: 3.39 MIL/uL — ABNORMAL LOW (ref 4.22–5.81)
RDW: 17.2 % — ABNORMAL HIGH (ref 11.5–15.5)
WBC: 8.4 10*3/uL (ref 4.0–10.5)
nRBC: 0 % (ref 0.0–0.2)

## 2021-08-30 LAB — HEPARIN LEVEL (UNFRACTIONATED): Heparin Unfractionated: >1.1 IU/mL — ABNORMAL HIGH (ref 0.30–0.70)

## 2021-08-30 MED ORDER — DILTIAZEM HCL ER COATED BEADS 120 MG PO CP24
120.0000 mg | ORAL_CAPSULE | Freq: Every day | ORAL | Status: DC
  Administered 2021-08-31 – 2021-09-15 (×13): 120 mg via ORAL
  Filled 2021-08-30 (×15): qty 1

## 2021-08-30 MED ORDER — SENNA 8.6 MG PO TABS
2.0000 | ORAL_TABLET | Freq: Every day | ORAL | Status: DC
  Administered 2021-08-30 – 2021-09-14 (×11): 17.2 mg via ORAL
  Filled 2021-08-30 (×15): qty 2

## 2021-08-30 MED ORDER — PANTOPRAZOLE SODIUM 40 MG PO TBEC
40.0000 mg | DELAYED_RELEASE_TABLET | Freq: Every day | ORAL | Status: DC
  Administered 2021-08-31 – 2021-09-15 (×16): 40 mg via ORAL
  Filled 2021-08-30 (×16): qty 1

## 2021-08-30 MED ORDER — ACETAMINOPHEN 325 MG PO TABS
325.0000 mg | ORAL_TABLET | ORAL | Status: DC | PRN
  Administered 2021-09-03 – 2021-09-07 (×2): 650 mg via ORAL
  Filled 2021-08-30 (×2): qty 2

## 2021-08-30 MED ORDER — PROCHLORPERAZINE MALEATE 5 MG PO TABS
5.0000 mg | ORAL_TABLET | Freq: Four times a day (QID) | ORAL | Status: DC | PRN
  Administered 2021-09-04 – 2021-09-14 (×2): 10 mg via ORAL
  Filled 2021-08-30 (×4): qty 2

## 2021-08-30 MED ORDER — CINACALCET HCL 30 MG PO TABS
60.0000 mg | ORAL_TABLET | Freq: Every day | ORAL | Status: DC
  Administered 2021-08-30 – 2021-09-12 (×12): 60 mg via ORAL
  Filled 2021-08-30 (×13): qty 2

## 2021-08-30 MED ORDER — POLYETHYLENE GLYCOL 3350 17 G PO PACK
17.0000 g | PACK | Freq: Every day | ORAL | Status: DC
  Administered 2021-08-31 – 2021-09-08 (×7): 17 g via ORAL
  Filled 2021-08-30 (×11): qty 1

## 2021-08-30 MED ORDER — GABAPENTIN 100 MG PO CAPS
100.0000 mg | ORAL_CAPSULE | Freq: Two times a day (BID) | ORAL | Status: DC
  Administered 2021-08-30 – 2021-09-15 (×32): 100 mg via ORAL
  Filled 2021-08-30 (×33): qty 1

## 2021-08-30 MED ORDER — SODIUM CHLORIDE 0.9 % IV SOLN: INTRAVENOUS | Status: DC

## 2021-08-30 MED ORDER — SEVELAMER CARBONATE 800 MG PO TABS: 1600.0000 mg | ORAL_TABLET | Freq: Three times a day (TID) | ORAL | Status: DC | PRN

## 2021-08-30 MED ORDER — WITCH HAZEL-GLYCERIN EX PADS
MEDICATED_PAD | CUTANEOUS | Status: DC | PRN
  Filled 2021-08-30: qty 100

## 2021-08-30 MED ORDER — HYDROCORTISONE ACETATE 25 MG RE SUPP
25.0000 mg | Freq: Two times a day (BID) | RECTAL | Status: DC
  Administered 2021-08-30 – 2021-09-14 (×8): 25 mg via RECTAL
  Filled 2021-08-30 (×38): qty 1

## 2021-08-30 MED ORDER — SEVELAMER CARBONATE 2.4 G PO PACK
2.4000 g | PACK | Freq: Three times a day (TID) | ORAL | Status: DC
  Administered 2021-08-30 – 2021-09-08 (×18): 2.4 g via ORAL
  Filled 2021-08-30 (×35): qty 1

## 2021-08-30 MED ORDER — ATORVASTATIN CALCIUM 10 MG PO TABS
10.0000 mg | ORAL_TABLET | Freq: Every day | ORAL | Status: DC
  Administered 2021-08-30 – 2021-09-15 (×16): 10 mg via ORAL
  Filled 2021-08-30 (×17): qty 1

## 2021-08-30 MED ORDER — SODIUM CHLORIDE 0.9 % IV SOLN
125.0000 mg | INTRAVENOUS | Status: DC
  Administered 2021-09-01 – 2021-09-05 (×3): 125 mg via INTRAVENOUS
  Filled 2021-08-30 (×5): qty 10

## 2021-08-30 MED ORDER — MILK AND MOLASSES ENEMA
1.0000 | RECTAL | Status: DC | PRN
  Filled 2021-08-30: qty 240

## 2021-08-30 MED ORDER — DIPHENHYDRAMINE HCL 12.5 MG/5ML PO ELIX
12.5000 mg | ORAL_SOLUTION | Freq: Four times a day (QID) | ORAL | Status: DC | PRN
  Filled 2021-08-30: qty 10

## 2021-08-30 MED ORDER — METOPROLOL TARTRATE 25 MG PO TABS
25.0000 mg | ORAL_TABLET | Freq: Two times a day (BID) | ORAL | Status: DC
  Administered 2021-08-30 – 2021-08-31 (×3): 25 mg via ORAL
  Filled 2021-08-30 (×4): qty 1

## 2021-08-30 MED ORDER — SODIUM CHLORIDE 0.9 % IV SOLN
250.0000 mL | INTRAVENOUS | Status: DC | PRN
  Administered 2021-09-07: 250 mL via INTRAVENOUS

## 2021-08-30 MED ORDER — PROCHLORPERAZINE 25 MG RE SUPP
12.5000 mg | Freq: Four times a day (QID) | RECTAL | Status: DC | PRN
  Filled 2021-08-30: qty 1

## 2021-08-30 MED ORDER — PROCHLORPERAZINE EDISYLATE 10 MG/2ML IJ SOLN: 5.0000 mg | Freq: Four times a day (QID) | INTRAMUSCULAR | Status: DC | PRN

## 2021-08-30 MED ORDER — DOCUSATE SODIUM 100 MG PO CAPS
100.0000 mg | ORAL_CAPSULE | Freq: Three times a day (TID) | ORAL | Status: DC
  Administered 2021-08-30 – 2021-09-16 (×34): 100 mg via ORAL
  Filled 2021-08-30 (×40): qty 1

## 2021-08-30 MED ORDER — ALBUTEROL SULFATE (2.5 MG/3ML) 0.083% IN NEBU: 3.0000 mL | INHALATION_SOLUTION | Freq: Four times a day (QID) | RESPIRATORY_TRACT | Status: DC | PRN

## 2021-08-30 MED ORDER — DARBEPOETIN ALFA 60 MCG/0.3ML IJ SOSY: 60.0000 ug | PREFILLED_SYRINGE | INTRAMUSCULAR | Status: DC

## 2021-08-30 MED ORDER — CALCIUM CARBONATE ANTACID 500 MG PO CHEW: 1.0000 | CHEWABLE_TABLET | Freq: Four times a day (QID) | ORAL | Status: DC | PRN

## 2021-08-30 MED ORDER — METOPROLOL TARTRATE 25 MG PO TABS: 25.0000 mg | ORAL_TABLET | Freq: Two times a day (BID) | ORAL | Status: DC

## 2021-08-30 MED ORDER — OXYCODONE HCL 5 MG PO TABS
10.0000 mg | ORAL_TABLET | ORAL | Status: DC | PRN
  Administered 2021-08-31 – 2021-09-15 (×15): 10 mg via ORAL
  Filled 2021-08-30 (×16): qty 2

## 2021-08-30 MED ORDER — HEPARIN (PORCINE) 25000 UT/250ML-% IV SOLN
1950.0000 [IU]/h | INTRAVENOUS | Status: DC
  Administered 2021-08-31 – 2021-09-02 (×4): 1950 [IU]/h via INTRAVENOUS
  Filled 2021-08-30 (×5): qty 250

## 2021-08-30 MED ORDER — SERTRALINE HCL 100 MG PO TABS
100.0000 mg | ORAL_TABLET | Freq: Every day | ORAL | Status: DC
  Administered 2021-08-31 – 2021-09-15 (×16): 100 mg via ORAL
  Filled 2021-08-30 (×16): qty 1

## 2021-08-30 MED ORDER — MIDODRINE HCL 5 MG PO TABS
10.0000 mg | ORAL_TABLET | Freq: Three times a day (TID) | ORAL | Status: DC
  Administered 2021-08-30 – 2021-09-16 (×43): 10 mg via ORAL
  Filled 2021-08-30 (×44): qty 2

## 2021-08-30 MED ORDER — POLYETHYLENE GLYCOL 3350 17 G PO PACK
17.0000 g | PACK | Freq: Every day | ORAL | Status: DC
  Administered 2021-08-30: 17 g via ORAL
  Filled 2021-08-30: qty 1

## 2021-08-30 MED ORDER — BISACODYL 10 MG RE SUPP: 10.0000 mg | Freq: Every day | RECTAL | Status: DC | PRN

## 2021-08-30 MED ORDER — GUAIFENESIN-DM 100-10 MG/5ML PO SYRP: 5.0000 mL | ORAL_SOLUTION | Freq: Four times a day (QID) | ORAL | Status: DC | PRN

## 2021-08-30 NOTE — Progress Notes (Signed)
Physical Therapy Session Note  Patient Details  Name: Scott Castro MRN: [id number] Date of Birth: June 10, 1956  Today's Date: 08/30/2021 PT Individual Time: 1315-1400 PT Individual Time Calculation (min): 45 min   Short Term Goals: Week 3:  PT Short Term Goal 1 (Week 3): =LTGs d/t extended LOS  Skilled Therapeutic Interventions/Progress Updates:    Pt received seated in bed having midline IV placed. Pt missed 15 min of scheduled therapy session to allow time for placement of IV. Pt then received seated in bed, initially declines participation in therapy session but with encouragement agreeable to bed level exercises. Pt's RUE then noted to be bleeding from site of his previous IV, placed mepilex over site and changed out soiled mepilex over other forearm wound. Pt's RUE continues to weep throughout session due to edema. Supine RLE SLR x 15 reps, hip abd x 15 reps; LLE heel slides x 10 reps, hip abd with AAROM x 15 reps. Pt left seated in bed with needs in reach, bed alarm in place at end of session.  Therapy Documentation Precautions:  Precautions Precautions: Fall Restrictions Weight Bearing Restrictions: Yes RLE Weight Bearing: Non weight bearing Other Position/Activity Restrictions: s/p R BKA General: PT Amount of Missed Time (min): 15 Minutes PT Missed Treatment Reason: Nursing care (midline placement)      Therapy/Group: Individual Therapy   Excell Seltzer, PT, DPT, CSRS  08/30/2021, 4:57 PM

## 2021-08-30 NOTE — Assessment & Plan Note (Signed)
Per therapy recommendation.

## 2021-08-30 NOTE — Progress Notes (Signed)
Inpatient Rehabilitation Discharge Medication Review by a Pharmacist  A complete drug regimen review was completed for this patient to identify any potential clinically significant medication issues.  High Risk Drug Classes Is patient taking? Indication by Medication  Antipsychotic No   Anticoagulant Yes Heparin infusion (holding PTA Apixaban and Aspirin)  Antibiotic No   Opioid Yes Oxycodone prn moderate or severe pain  Antiplatelet No   Hypoglycemics/insulin No   Vasoactive Medication Yes Diltiazem CD and Metoprolol for atrial fibrillation; midodrine for blood pressure  Chemotherapy No   Other Yes Atrovastatin for hyperlipidemia Gabapentin for neuropathic pain Sertraline for reactive depression. Sensipar and Renvela for calcium/phosphate balance Ferrlecit for iron supplementation Darbepoetin for anemia Pantoprazole for GERD Anusol HC suppositories for hemorrhoids. Miralax and senokot for constipation.     Type of Medication Issue Identified Description of Issue Recommendation(s)  Significant med changes from prior encounter (inform family/care partners about these prior to discharge). Sertraline was added 1/5 and dose increased 1/19.   Midodrine was added on 1/18. Metoprolol dose increasing with 1/22 pm dose.  Anusol HC new and laxatives changed. Norco prn changed to Oxycodone prn. Medication changes can be discussed prior to discharge from inpatient unit.    Clinically significant medication issues were identified that warrant physician communication and completion of prescribed/recommended actions by midnight of the next day:  No  Pharmacist comments:    Currently on IV heparin for anticoagulation.   PTA Eliquis has been on hold since 1/18 for upcoming procedure.   Has been on Aspirin 81 mg daily, discontinued on transfer to inpatient unit. Anticipate both resuming when appropriate post-procedure.   2 of 8 planned Ferrlecit doses were given while on CIR>  6 more doses to  be given.  Time spent performing this drug regimen review (minutes):  20   Arty Baumgartner, [place] 08/30/2021 5:39 PM

## 2021-08-30 NOTE — H&P (Signed)
History and Physical    [name]:[id number] DOB: 1955-09-10 DOA: 08/30/2021  PCP: Franciso Bend, MD Consultants:  cardiology, Dr. Gardiner Rhyme nephrology: dr.chang>dr. Johnney Ou, pulmonology: Dr. Raul Del  Patient coming from: inpatient rehab   Chief Complaint: inpatient transfer to telemetry   HPI: Scott Castro is a 66 y.o. male with medical history significant of ESRD on dialysis TTS, but changing to MWF, HTN, HLD, PAF, depression, chronic respiratory failure on 2-3L [place], PAD with recent right BKA on 08/07/21 secondary to critical limb ischemia discharged to rehab. He was seen by cardiology today who recommended he be transferred inpatient for telemetry monitoring to make sure atrial fib HR acceptable prior to surgery.   Recently hospitalized at [facility] from 12/28 - 08/12/2021 for right lower extremity critical limb ischemia status post BKA by vascular on 12/30.  Was just discharged to inpatient rehab here at [facility].  He is feeling well.  Denies any fever chills, headache or vision changes, chest pain or palpitations, shortness of breath or cough, stomach pain, nausea vomiting diarrhea, he does not make any urine.  He denies any smoking, alcohol or drug use.   Vitals on arrival to 4E Afebrile, bp: 107/64, HR: 113, RR: 20, oxygen 100% on 2L [place] Pertinent labs: hgb 10.4 (baseline around 10), sodium 133, BUN: 38, creatinine 5.82,   Review of Systems: As per HPI; otherwise review of systems reviewed and negative.   Ambulatory Status:  Ambulates without assistance prior to BKA    Past Medical History:  Diagnosis Date   Barrett's esophagus    Chronic kidney disease    CHRONIC\   Glomerulosclerosis, focal    Hyperparathyroidism due to renal insufficiency [facility])     Past Surgical History:  Procedure Laterality Date   AMPUTATION Right 08/07/2021   Procedure: AMPUTATION BELOW KNEE;  Surgeon: Algernon Huxley, MD;  Location: [facility] ORS;  Service: Vascular;  Laterality: Right;   AV FISTULA  PLACEMENT     COLONOSCOPY N/A 03/22/2016   Procedure: COLONOSCOPY;  Surgeon: Manya Silvas, MD;  Location: [facility] ENDOSCOPY;  Service: Endoscopy;  Laterality: N/A;   DG AV DIALYSIS GRAFT DECLOT OR     ESOPHAGOGASTRODUODENOSCOPY (EGD) WITH PROPOFOL  03/22/2016   Procedure: ESOPHAGOGASTRODUODENOSCOPY (EGD) WITH PROPOFOL;  Surgeon: Manya Silvas, MD;  Location: [facility] ENDOSCOPY;  Service: Endoscopy;;   FLEXIBLE BRONCHOSCOPY     KIDNEY TRANSPLANT Right 1985   LOWER EXTREMITY ANGIOGRAPHY Right 07/20/2021   Procedure: Lower Extremity Angiography;  Surgeon: Algernon Huxley, MD;  Location: [place] CV LAB;  Service: Cardiovascular;  Laterality: Right;   LOWER EXTREMITY ANGIOGRAPHY Right 08/06/2021   Procedure: Lower Extremity Angiography;  Surgeon: Algernon Huxley, MD;  Location: [place] CV LAB;  Service: Cardiovascular;  Laterality: Right;   REMOVAL TENCKHOFF CATH      Social History   Socioeconomic History   Marital status: Single    Spouse name: Not on file   Number of children: Not on file   Years of education: Not on file   Highest education level: Not on file  Occupational History   Not on file  Tobacco Use   Smoking status: Never   Smokeless tobacco: Never  Substance and Sexual Activity   Alcohol use: Not Currently    Comment: very little   Drug use: No   Sexual activity: Not on file  Other Topics Concern   Not on file  Social History Narrative   Not on file   Social Determinants of  Health   Financial Resource Strain: Not on file  Food Insecurity: Not on file  Transportation Needs: Not on file  Physical Activity: Not on file  Stress: Not on file  Social Connections: Not on file  Intimate Partner Violence: Not on file    No Known Allergies  Family History  Problem Relation Age of Onset   Heart disease Mother    Heart disease Father     Prior to Admission medications   Medication Sig Start Date End Date Taking? Authorizing Provider  albuterol (VENTOLIN  HFA) 108 (90 Base) MCG/ACT inhaler Inhale 2 puffs into the lungs every 6 (six) hours as needed for wheezing or shortness of breath. 07/08/21   Enzo Bi, MD  apixaban (ELIQUIS) 5 MG TABS tablet Take 1 tablet (5 mg total) by mouth 2 (two) times daily. 07/22/21   Sharen Hones, MD  atorvastatin (LIPITOR) 10 MG tablet Take 1 tablet (10 mg total) by mouth at bedtime. 07/22/21   Sharen Hones, MD  cinacalcet (SENSIPAR) 60 MG tablet Take 60 mg by mouth daily.    [provider]  diclofenac Sodium (VOLTAREN) 1 % GEL Apply 2 g topically 4 (four) times daily. Use on feet    [provider]  diltiazem (CARDIZEM CD) 120 MG 24 hr capsule Take 1 capsule (120 mg total) by mouth daily. Hold for sbp < 100 08/12/21   Antonieta Pert, MD  feeding supplement (ENSURE ENLIVE / ENSURE PLUS) LIQD Take 237 mLs by mouth 2 (two) times daily between meals. 08/12/21   Antonieta Pert, MD  gabapentin (NEURONTIN) 100 MG capsule Take 1 capsule (100 mg total) by mouth 2 (two) times daily. 08/12/21   Antonieta Pert, MD  HYDROcodone-acetaminophen (NORCO) 7.5-325 MG tablet Take 1 tablet by mouth 3 (three) times daily as needed. 08/05/21   [provider]  methocarbamol (ROBAXIN) 500 MG tablet Take 1 tablet (500 mg total) by mouth every 8 (eight) hours as needed for muscle spasms. 08/12/21   Antonieta Pert, MD  metoprolol tartrate (LOPRESSOR) 25 MG tablet Take 0.5 tablets (12.5 mg total) by mouth 2 (two) times daily. 08/12/21   Antonieta Pert, MD  pantoprazole (PROTONIX) 40 MG tablet Take 40 mg by mouth daily. 03/27/19   [provider]  polyethylene glycol (MIRALAX / GLYCOLAX) 17 g packet Take 17 g by mouth daily as needed. 08/12/21   Antonieta Pert, MD  senna-docusate (SENOKOT-S) 8.6-50 MG tablet Take 2 tablets by mouth 2 (two) times daily. 08/12/21   Antonieta Pert, MD  sevelamer carbonate (RENVELA) 800 MG tablet Take by mouth See admin instructions. Take 2400 mg by mouth three times daily, 1600 mg with snacks    [provider]     Physical Exam: Vitals:   08/30/21 1700  BP: 107/64  Pulse: (!) 113  Resp: 20  Temp: 98.6 F (37 C)  TempSrc: Oral  SpO2: 100%     General:  Appears calm and comfortable and is in NAD Eyes:  PERRL, EOMI, normal lids, iris ENT:  grossly normal hearing, lips & tongue, mmm; appropriate dentition Neck:  no LAD, masses or thyromegaly; no carotid bruits Cardiovascular:  RRR, systolic murmur  No LE edema.  Respiratory:   CTA bilaterally with no wheezes/rales/rhonchi.  Normal respiratory effort. Abdomen:  soft, NT, ND, NABS Back:   normal alignment, no CVAT Skin:  no rash or induration seen on limited exam. Fistula in LU bicep. Good thrill Musculoskeletal:  grossly normal tone BUE/right BKA, left LE with good movement,  strength. , good ROM, no bony abnormality Lower extremity: LLE edema, 2+ pitting.  Left foot with purple color and petechiae and bullous blisters. Cool to touch. Painful    Psychiatric:  grossly normal mood and affect, speech fluent and appropriate, AOx3 Neurologic:  CN 2-12 grossly intact, moves all extremities in coordinated fashion, sensation intact    Radiological Exams on Admission: Independently reviewed - see discussion in A/P where applicable  No results found.  Labs on Admission: I have personally reviewed the available labs and imaging studies at the time of the admission.   Assessment/Plan * Critical limb ischemia of left lower extremity/PAD - (present on admission) - Angiography 1/20 by Dr. [name] revealed diffuse infrapopliteal disease on the left with no revascularization options.  -currently plan of left BKA early next week -on IV heparin for a fib -statin   -ASA on hold     AF (paroxysmal atrial fibrillation) ([place])- (present on admission) Followed by cardiology Recommended transfer to floor for telemetry to make sure rate okay prior to surgery Continue metoprolol and cardizem Heparin gtt and can transition back to eliquis post BKA    ESRD (end stage renal disease) ([place])- (present on admission) ESRD with hx of renal transplant in 1985 Nephrology has been following while in rehab and Dr.Schertz made aware he has moved rooms to inpatient Continue dialysis per nephrology Intake/output, follow renal function   Chronic respiratory failure with hypoxia ([place])- (present on admission) Patient appears to have been placed on oxygen at 2-3L [place] from his hospital stay early in December, possibly after his surgery on 12/13, but no mention in progress note. On discharge summary stated patient evaluated for home oxygen -continue to be on 2-3L oxygen prn  -per rehab note will need home oxygen   COPD (chronic obstructive pulmonary disease) ([place])- (present on admission) Stable, continue albuterol prn   Barrett esophagus- (present on admission) Continue protonix daily   Anemia- (present on admission) Baseline around 10, continue to monitor IV iron per nephrology   Mixed hyperlipidemia- (present on admission) LDL checked yesterday and 34 Continue statin   S/P BKA (below knee amputation), right ([place]) Per therapy recommendation.   Depressive reaction- (present on admission) Stable, continue zoloft      There is no height or weight on file to calculate BMI.   Level of care: Telemetry Medical DVT prophylaxis:  heparin gtt Code Status:  Full - confirmed with patient Family Communication: None present  Disposition Plan:  The patient is from: home  Anticipated d/c is to: per day team, likely rehab   Requires inpatient hospitalization for critical limb ischemia and need for intervention, IV medication and fluids and is at significant risk of worsening, requires constant monitoring, assessment and MDM with specialists.     Patient is currently: stable  Consults called:  nephrology/cardiology/vascular following  Admission status:  inpatient    Orma Flaming MD [facility]   How to contact the [facility] Attending or  Consulting provider [place] or covering provider during after hours [place], for this patient?  Check the care team in [facility] and look for a) attending/consulting TR[facility] provider listed and b) the [facility] team listed Log into [email or web address] and use [place]'s universal password to access. If you do not have the password, please contact the hospital operator. Locate the [facility] provider you are looking for under [facility] and page to a number that you can be directly reached. If you still have difficulty reaching the provider, please page  the [facility] (Director on Call) for the Hospitalists listed on amion for assistance.   08/30/2021, 6:47 PM

## 2021-08-30 NOTE — Progress Notes (Signed)
[facility] Progress Note   66 y.o. male PMH COPD Barrett's, hyperparathyroidism, ESRD, failed renal transplant, PAF on Eliquis p/w ischemic foot ulcer -> BKA on 12/30.  Patient receives his dialysis treatments at [place] with [facility].   Dialysis at [facility] with [facility] EDW 67.4kg? EDW before the amputation was 72kg   Elisio 17  Assessment/ Plan:   ESRD - TTS at [facility] w/ [facility] - establishing new EDW  - HD Monday - set up for MWF outpt now, though this may further change, see below.  We'll go ahead and do Monday as it looks like his amputation may be Tues   -  Uses elisio dialyzer at [place] but has been tolerating our standard optiflux dialyzer here  [name]- last Phos 1/1 was 5; 1/16 phos 3.4 [name] 3 tabs TIDM. [name] 60mg  qdaily. Anemia - on ESA and s/p iron load.  Required transfusion.  ^ ESA dose.  PAD with critical limb ischemia s/p rt BKA for gangrene - now will require LBKA s/p angiogram 1/20 showed no intervention possible so plans for amputation while admitted.   PAF - rate controlled and  Eliquis typically. On dilt and metop - cont even prior to dialysis.  Was having some RVR periodically during this admission and cardiology has been consulted - titrating BB and watching BP.  He's on midodrine now too.  GERD COPD   9. Dispo: private transport to [place] HD unit from his [place] too costly, will switch from [name] to [facility].   Now that he's having a 2nd amputation he will likely go to SNF and plans may need to change further depending on location of SNF.   Subjective:    Continued rest pain of LLE that improves by dangling leg.  Has decided to have amputation while admitted.  He thinks it won't be Monday.   BP 109/71 (BP Location: Right Arm)    Pulse (!) 115    Temp 98 F (36.7 C)    Resp 17    Ht 5\' 9"  (1.753 m)    Wt 70.3 kg    SpO2 100%    BMI 22.89 kg/m   Intake/Output Summary (Last 24 hours) at  08/30/2021 1210 Last data filed at 08/29/2021 1731 Gross per 24 hour  Intake 160 ml  Output 1500 ml  Net -1340 ml    Weight change: 1.6 kg  Physical Exam: GEN: NAD, A&Ox3, NCAT HEENT: MMM LUNGS: CTA B/L no rales, rhonchi or wheezing CV: irregular irregular HR low 100s, crescendo murmur ABD: SNDNT +BS  EXT:R BKA, L foot with purpura, blistering, partially wrapped ACCESS: Lt BBT--> distal portion is aneurysmal +t/b  Imaging: PERIPHERAL VASCULAR CATHETERIZATION  Result Date: 08/28/2021 DATE OF SERVICE: 08/28/2021  PATIENT:  Scott Castro  66 y.o. male  PRE-OPERATIVE DIAGNOSIS:  Atherosclerosis of native arteries of left lower extremity causing ischemic skin changes  POST-OPERATIVE DIAGNOSIS:  Same  PROCEDURE:  1) US guided right common femoral artery access 2) Aortogram 3) Left lower extremity angiogram with second order cannulation (35mL total contrast) 4) Conscious sedation (31 minutes)  SURGEON:  [name], MD  ASSISTANT: none  ANESTHESIA:   local and IV sedation  ESTIMATED BLOOD LOSS: minimal  LOCAL MEDICATIONS USED:  LIDOCAINE  COUNTS: confirmed correct.  PATIENT DISPOSITION:  PACU - hemodynamically stable.  Delay start of Pharmacological VTE agent (>24hrs) due to surgical blood loss or risk of bleeding: no  INDICATION FOR PROCEDURE:  Scott Castro is a 66 y.o. male with ischemic skin changes about the left foot, and non-invasive evidence of peripheral arterial disease. After careful discussion of risks, benefits, and alternatives the patient was offered angiography. The patient understood and wished to proceed.  OPERATIVE FINDINGS: Terminal aorta and iliac arteries: Aorta and iliacs widely patent Renals occluded  Left lower extremity: Common femoral artery: patent without stenosis Profunda femoris artery: patent without stenosis  Superficial femoral artery: patent without stenosis Popliteal artery: patent without stenosis Anterior tibial artery: only tibial vessel; occludes at the ankle.  Tibioperoneal trunk: occluded Peroneal artery: occluded Posterior tibial artery: occluded Pedal circulation: fills via collaterals  DESCRIPTION OF PROCEDURE: After identification of the patient in the pre-operative holding area, the patient was transferred to the operating room. The patient was positioned supine on the operating room table. Anesthesia was induced. The groins was prepped and draped in standard fashion. A surgical pause was performed confirming correct patient, procedure, and operative location.  The right groin was anesthetized with subcutaneous injection of 1% lidocaine. Using ultrasound guidance, the right common femoral artery was accessed with micropuncture technique. Fluoroscopy was used to confirm cannulation over the femoral head. The 74F sheath was upsized to 70F.  A Benson wire was advanced into the distal aorta. Over the wire an omni flush catheter was advanced to the level of L2. Aortogram was performed - see above for details.  The left common iliac artery was selected with a benson guidewire. The wire was advanced into the common femoral artery. Over the wire the omni flush catheter was advanced into the external iliac artery. Selective angiography was performed - see above for details.  Conscious sedation was administered with the use of IV fentanyl and midazolam under continuous physician and nurse monitoring.  Heart rate, blood pressure, and oxygen saturation were continuously monitored.  Total sedation time was 31 minutes.  Upon completion of the case instrument and sharps counts were confirmed correct. The patient was transferred to the PACU in good condition. I was present for all portions of the procedure.  PLAN: No options for revascularization. Will need a left below knee amputation which is not urgent. He is depressed, and wants to speak to a chaplain and palliative care to discuss options and to help with spiritual care. Maximal medical therapy for PAD.  [name], MD  [facility] Phone Number: [phone] 08/28/2021 1:34 PM     Labs: BMET Recent Labs  Lab 08/25/21 1639 08/26/21 1004 08/27/21 1830 08/29/21 1629  NA 126* 131* 128* 133*  K 4.2 3.6 4.1 3.7  CL 93* 95* 92* 98  CO2 24 27 23 24   GLUCOSE 112* 134* 104* 144*  BUN 56* 31* 49* 38*  CREATININE 7.80* 4.82* 6.45* 5.82*  CALCIUM 6.4* 7.3* 7.2* 7.3*  PHOS  --  2.7 3.5 3.2    CBC Recent Labs  Lab 08/26/21 1004 08/27/21 0515 08/27/21 1830 08/28/21 0441 08/29/21 0451 08/30/21 0652  WBC 7.5   < > 6.8 5.4 6.6 8.4  NEUTROABS 5.7  --   --   --   --   --   HGB 7.2*   < > 7.5* 8.4* 8.0* 10.4*  HCT 23.1*   < > 24.0* 26.5* 26.0* 33.1*  MCV 99.1   < > 98.8 95.3 96.7 97.6  PLT 276   < > 368 317 294 287   < > = values in this interval not displayed.  Medications:     sodium chloride   Intravenous Once   aspirin EC  81 mg Oral Daily   atorvastatin  10 mg Oral QHS   cinacalcet  60 mg Oral Q supper   Darbepoetin Alfa  60 mcg Intravenous Q Tue-HD   diltiazem  120 mg Oral Daily   docusate sodium  100 mg Oral TID   gabapentin  100 mg Oral BID   hydrocortisone  25 mg Rectal BID   metoprolol tartrate  12.5 mg Oral BID   midodrine  10 mg Oral TID WC   pantoprazole  40 mg Oral Daily   senna  2 tablet Oral QHS   sertraline  100 mg Oral Daily   sevelamer carbonate  2.4 g Oral TID WC    Jannifer Hick MD [facility] Pager [phone]

## 2021-08-30 NOTE — Progress Notes (Signed)
Progress Note  Patient Name: Scott Castro Date of Encounter: 08/30/2021  [facility] Cardiologist: None   Subjective   HR recorded in 110s today, BP 101/73. Denies chest pain or dyspnea.  Inpatient Medications    Scheduled Meds:  sodium chloride   Intravenous Once   aspirin EC  81 mg Oral Daily   atorvastatin  10 mg Oral QHS   cinacalcet  60 mg Oral Q supper   Darbepoetin Alfa  60 mcg Intravenous Q Tue-HD   diltiazem  120 mg Oral Daily   docusate sodium  100 mg Oral TID   gabapentin  100 mg Oral BID   hydrocortisone  25 mg Rectal BID   metoprolol tartrate  12.5 mg Oral BID   midodrine  10 mg Oral TID WC   pantoprazole  40 mg Oral Daily   polyethylene glycol  17 g Oral Daily   senna  2 tablet Oral QHS   sertraline  100 mg Oral Daily   sevelamer carbonate  2.4 g Oral TID WC   Continuous Infusions:  sodium chloride     sodium chloride     ferric gluconate (FERRLECIT) IVPB 125 mg (08/29/21 1634)   heparin 1,950 Units/hr (08/29/21 2224)   PRN Meds: sodium chloride, acetaminophen, albuterol, bisacodyl, calcium carbonate, diphenhydrAMINE, guaiFENesin-dextromethorphan, milk and molasses, oxyCODONE, prochlorperazine **OR** prochlorperazine **OR** prochlorperazine, sevelamer carbonate, witch hazel-glycerin   Vital Signs    Vitals:   08/30/21 0406 08/30/21 0504 08/30/21 0818 08/30/21 1337  BP: 120/73  109/71 101/73  Pulse: 92  (!) 115 (!) 110  Resp: 18  17 18   Temp: 97.7 F (36.5 C)  98 F (36.7 C) 99.4 F (37.4 C)  TempSrc: Oral   Oral  SpO2: 91%  100% 100%  Weight:  70.3 kg    Height:        Intake/Output Summary (Last 24 hours) at 08/30/2021 1341 Last data filed at 08/29/2021 1731 Gross per 24 hour  Intake 160 ml  Output 1500 ml  Net -1340 ml    Last 3 Weights 08/30/2021 08/29/2021 08/29/2021  Weight (lbs) 154 lb 15.7 oz 154 lb 15.7 oz 158 lb 4.6 oz  Weight (kg) 70.3 kg 70.3 kg 71.8 kg      Telemetry    Not on telemetry  ECG    No new ECG -  Personally Reviewed  Physical Exam   GEN: No acute distress.   Neck: No JVD Cardiac: irregular, 3/6 systolic murmur Respiratory: Clear to auscultation bilaterally. GI: Soft, nontender, non-distended  MS: No edema; No deformity. Neuro:  Nonfocal  Psych: Normal affect   Labs    High Sensitivity Troponin:  No results for input(s): TROPONINIHS in the last 720 hours.   Chemistry Recent Labs  Lab 08/26/21 1004 08/27/21 1830 08/29/21 1629  NA 131* 128* 133*  K 3.6 4.1 3.7  CL 95* 92* 98  CO2 27 23 24   GLUCOSE 134* 104* 144*  BUN 31* 49* 38*  CREATININE 4.82* 6.45* 5.82*  CALCIUM 7.3* 7.2* 7.3*  ALBUMIN 1.8* 1.8* 1.9*  GFRNONAA 13* 9* 10*  ANIONGAP 9 13 11      Lipids  Recent Labs  Lab 08/29/21 0451  CHOL 78  TRIG 68  HDL 32*  LDLCALC 32  CHOLHDL 2.4     Hematology Recent Labs  Lab 08/28/21 0441 08/29/21 0451 08/30/21 0652  WBC 5.4 6.6 8.4  RBC 2.78* 2.69* 3.39*  HGB 8.4* 8.0* 10.4*  HCT 26.5* 26.0* 33.1*  MCV 95.3  96.7 97.6  MCH 30.2 29.7 30.7  MCHC 31.7 30.8 31.4  RDW 17.0* 17.2* 17.2*  PLT 317 294 287    Thyroid No results for input(s): TSH, FREET4 in the last 168 hours.  BNPNo results for input(s): BNP, PROBNP in the last 168 hours.  DDimer No results for input(s): DDIMER in the last 168 hours.   Radiology    No results found.  Cardiac Studies     Patient Profile     66 y.o. male with COPD, hypertension, hyperlipidemia, PAD with critical limb ischemia, atrial fibrillation and end-stage renal disease   Assessment & Plan    Atrial fibrillation-history of A. fib on Eliquis.  He is on Cardizem CD 120 and metoprolol 25 mg twice daily.  His heart rate is recorded in 110s range today but not on telemetry.   -I did discuss this with Dr Ranell Patrick today, would recommend transferring to different unit where he can be on telemetry so we can make sure rates are controlled prior to going for surgery -Systolic murmur on exam, will check  echocardiogram -Continue IV heparin, can switch back to Eliquis after surgery  Critical limb ischemia- status post right BKA on 08/07/2021.  Angiography 1/20 by Dr. [name] revealed diffuse infrapopliteal disease on the left with no revascularization options. Vascular surgery planning left BKA early next week  Hyperlipidemia-on low-dose atorvastatin  COPD-on home O2  End-stage renal disease-on hemodialysis for the last 16 years.  Left upper extremity AV fistula.   For questions or updates, please contact [place] Please consult [email or web address] for contact info under        Signed, Donato Heinz, MD  08/30/2021, 1:41 PM

## 2021-08-30 NOTE — Progress Notes (Signed)
ANTICOAGULATION CONSULT NOTE - Follow Up Consult  Pharmacy Consult for Apixaban >> Heparin Indication: atrial fibrillation  No Known Allergies  Patient Measurements:   Heparin Dosing Weight: 74.6 kg  Vital Signs: Temp: 99.4 F (37.4 C) (01/22 1337) Temp Source: Oral (01/22 1337) BP: 101/73 (01/22 1337) Pulse Rate: 110 (01/22 1337)  Labs: Recent Labs    08/27/21 1830 08/27/21 1830 08/28/21 0441 08/29/21 0451 08/29/21 1400 08/29/21 1629 08/29/21 2132 08/30/21 0652 08/30/21 1006  HGB 7.5*  --  8.4* 8.0*  --   --   --  10.4*  --   HCT 24.0*  --  26.5* 26.0*  --   --   --  33.1*  --   PLT 368  --  317 294  --   --   --  287  --   APTT  --    < > 47* 57*   < >  --  81* 56* 66*  HEPARINUNFRC  --   --  >1.10* >1.10*  --   --   --  >1.10*  --   CREATININE 6.45*  --   --   --   --  5.82*  --   --   --    < > = values in this interval not displayed.     Estimated Creatinine Clearance: 12.4 mL/min (A) (by C-G formula based on SCr of 5.82 mg/dL (H)).   Assessment: 66 yo M recently had right BKA for PAD. He has been on apixaban for his AF.   He is s/p left angiogram on 1/20 with no options for revascularization and will need a left BKA.  -He was transferred from Rehab to 4E for closer monitoring -aPTT was at goal this morning   Goal of Therapy:  Heparin level 0.3-0.7 units/ml aPTT 66-102 seconds Monitor platelets by anticoagulation protocol: Yes   Plan:  Continue heparin gtt at 1950 units/hr. Daily CBC, heparin level, aPTT.  Hildred Laser, PharmD Clinical Pharmacist **Pharmacist phone directory can now be found on [email or web address] ([email or web address]).  Listed under [place].

## 2021-08-30 NOTE — Progress Notes (Signed)
ANTICOAGULATION CONSULT NOTE - Follow Up Consult  Pharmacy Consult for Apixaban >> Heparin Indication: atrial fibrillation  No Known Allergies  Patient Measurements: Height: 5\' 9"  (175.3 cm) Weight: 70.3 kg (154 lb 15.7 oz) IBW/kg (Calculated) : 70.7 Heparin Dosing Weight: 74.6 kg  Vital Signs: Temp: 98 F (36.7 C) (01/22 0818) Temp Source: Oral (01/22 0406) BP: 109/71 (01/22 0818) Pulse Rate: 115 (01/22 0818)  Labs: Recent Labs    08/27/21 1830 08/28/21 0441 08/29/21 0451 08/29/21 1400 08/29/21 1629 08/29/21 2132 08/30/21 0652 08/30/21 1006  HGB 7.5* 8.4* 8.0*  --   --   --  10.4*  --   HCT 24.0* 26.5* 26.0*  --   --   --  33.1*  --   PLT 368 317 294  --   --   --  287  --   APTT  --  47* 57*   < >  --  81* 56* 66*  HEPARINUNFRC  --  >1.10* >1.10*  --   --   --  >1.10*  --   CREATININE 6.45*  --   --   --  5.82*  --   --   --    < > = values in this interval not displayed.    Estimated Creatinine Clearance: 12.4 mL/min (A) (by C-G formula based on SCr of 5.82 mg/dL (H)).   Medications:  Scheduled:   sodium chloride   Intravenous Once   aspirin EC  81 mg Oral Daily   atorvastatin  10 mg Oral QHS   cinacalcet  60 mg Oral Q supper   Darbepoetin Alfa  60 mcg Intravenous Q Tue-HD   diltiazem  120 mg Oral Daily   docusate sodium  100 mg Oral TID   gabapentin  100 mg Oral BID   hydrocortisone  25 mg Rectal BID   metoprolol tartrate  12.5 mg Oral BID   midodrine  10 mg Oral TID WC   pantoprazole  40 mg Oral Daily   senna  2 tablet Oral QHS   sertraline  100 mg Oral Daily   sevelamer carbonate  2.4 g Oral TID WC   Infusions:   sodium chloride     sodium chloride     ferric gluconate (FERRLECIT) IVPB 125 mg (08/29/21 1634)   heparin 1,950 Units/hr (08/29/21 2224)    Assessment: 66 yo M recently had right BKA for PAD. He has been on apixaban for his AF.   He is s/p left angiogram on 1/20 with no options for revascularization and will need a left BKA.  Pharmacy consulted to resume IV heparin 4 hrs post sheath removal. Sheath removed intact on 1/20 @ 1342.  Heparin resumed on 1/20 @ 1738. Given recent apixaban use, will monitor anticoagulation using aPTT until aPTT and heparin levels correlate.   aPTT this morning is subtherapeutic at 59 on 1950 units/hr. Heparin level is not correlating yet. Hgb 10.4, plt 287. No line issues or signs/symptoms of bleeding noted per RN.  Addendum:  Given aPTT was previously therapeutic x2 yesterday, ordered another aPTT stat for today. aPTT is therapeutic at 66 (low end of goal). Will continue heparin gtt at current rate and recheck aPTT/heparin level tomorrow morning.   Goal of Therapy:  Heparin level 0.3-0.7 units/ml aPTT 66-102 seconds Monitor platelets by anticoagulation protocol: Yes   Plan:  Continue heparin gtt at 1950 units/hr. Daily CBC, heparin level, aPTT. Monitor for signs/symptoms of bleeding.   Vance Peper, PharmD PGY1 Pharmacy Resident  Phone [phone] 08/30/2021 10:56 AM   Please check AMION for all [place] phone numbers After 10:00 PM, call [place] [phone]

## 2021-08-30 NOTE — Assessment & Plan Note (Signed)
Stable, continue zoloft.  

## 2021-08-30 NOTE — Progress Notes (Signed)
Pt arrived from rehab, VSS, CHG complete, tele initiated, oriented to unit, call light within reach.   Chrisandra Carota, RN 08/30/2021 5:06 PM

## 2021-08-30 NOTE — Progress Notes (Addendum)
PROGRESS NOTE   Subjective/Complaints: Midline ordered as IV blew our and patient is a very difficulty stick, needs continuous heparin Constipated Hemorrhoids   ROS:   Pt denies SOB, abd pain, CP, N/V/C/D, and vision changes, +lower extremity pain, +constipation   Objective:   No results found. Recent Labs    08/29/21 0451 08/30/21 0652  WBC 6.6 8.4  HGB 8.0* 10.4*  HCT 26.0* 33.1*  PLT 294 287    Recent Labs    08/27/21 1830 08/29/21 1629  NA 128* 133*  K 4.1 3.7  CL 92* 98  CO2 23 24  GLUCOSE 104* 144*  BUN 49* 38*  CREATININE 6.45* 5.82*  CALCIUM 7.2* 7.3*     Intake/Output Summary (Last 24 hours) at 08/30/2021 1335 Last data filed at 08/29/2021 1731 Gross per 24 hour  Intake 160 ml  Output 1500 ml  Net -1340 ml     Pressure Injury 08/26/21 Heel Left Deep Tissue Pressure Injury - Purple or maroon localized area of discolored intact skin or blood-filled blister due to damage of underlying soft tissue from pressure and/or shear. (Active)  08/26/21 1826  Location: Heel  Location Orientation: Left  Staging: Deep Tissue Pressure Injury - Purple or maroon localized area of discolored intact skin or blood-filled blister due to damage of underlying soft tissue from pressure and/or shear.  Wound Description (Comments):   Present on Admission: No    Physical Exam: Vital Signs Blood pressure 109/71, pulse (!) 115, temperature 98 F (36.7 C), resp. rate 17, height 5\' 9"  (1.753 m), weight 70.3 kg, SpO2 100 %. Gen: no distress, normal appearing HEENT: oral mucosa pink and moist, NCAT Cardio: Tachycardic Chest: normal effort, normal rate of breathing Abd: soft, non-distended Ext: no edema Psych: pleasant, normal affect Skin: intact  Skin: mottled appearance of L foot from arch to toes and also on bottom of foot to base of MTPs- looks worse with significant blisters rasing up on dorsum of L foot-  R  BKA- ecchymoses is spreading to around incision somewhat on RLE. Looks similar to yesterday.         Musculoskeletal:  LLE a few muscle spasms seen- not spasticity based on no increased tone  Extremities- LLE more swollen this AM- 2+ LLE edema      Assessment/Plan: 1. Functional deficits which require 3+ hours per day of interdisciplinary therapy in a comprehensive inpatient rehab setting. Physiatrist is providing close team supervision and 24 hour management of active medical problems listed below. Physiatrist and rehab team continue to assess barriers to discharge/monitor patient progress toward functional and medical goals  Care Tool:  Bathing  Bathing activity did not occur: Safety/medical concerns Body parts bathed by patient: Right arm, Left arm, Abdomen, Chest, Right upper leg, Left upper leg, Face, Front perineal area, Buttocks   Body parts bathed by helper: Left lower leg Body parts n/a: Right lower leg   Bathing assist Assist Level: Minimal Assistance - Patient > 75%     Upper Body Dressing/Undressing Upper body dressing Upper body dressing/undressing activity did not occur (including orthotics): Safety/medical concerns What is the patient wearing?: Pull over shirt    Upper body assist Assist  Level: Set up assist    Lower Body Dressing/Undressing Lower body dressing      What is the patient wearing?: Pants, Underwear/pull up     Lower body assist Assist for lower body dressing: Minimal Assistance - Patient > 75%     Toileting Toileting    Toileting assist Assist for toileting: Contact Guard/Touching assist     Transfers Chair/bed transfer  Transfers assist     Chair/bed transfer assist level: Contact Guard/Touching assist     Locomotion Ambulation   Ambulation assist   Ambulation activity did not occur: Safety/medical concerns  Assist level: Contact Guard/Touching assist Assistive device: Walker-rolling Max distance: 20   Walk 10  feet activity   Assist  Walk 10 feet activity did not occur: Safety/medical concerns  Assist level: Contact Guard/Touching assist Assistive device: Walker-rolling   Walk 50 feet activity   Assist Walk 50 feet with 2 turns activity did not occur: Safety/medical concerns         Walk 150 feet activity   Assist Walk 150 feet activity did not occur: Safety/medical concerns         Walk 10 feet on uneven surface  activity   Assist Walk 10 feet on uneven surfaces activity did not occur: Safety/medical concerns         Wheelchair     Assist Is the patient using a wheelchair?: Yes Type of Wheelchair: Manual    Wheelchair assist level: Supervision/Verbal cueing Max wheelchair distance: 250    Wheelchair 50 feet with 2 turns activity    Assist        Assist Level: Supervision/Verbal cueing   Wheelchair 150 feet activity     Assist      Assist Level: Supervision/Verbal cueing   Blood pressure 109/71, pulse (!) 115, temperature 98 F (36.7 C), resp. rate 17, height 5\' 9"  (1.753 m), weight 70.3 kg, SpO2 100 %.  Medical Problem List and Plan: 1. Functional deficits secondary to recent right below the knee amputation secondary to critical limb ischemia, peripheral arterial disease, severe right lower extremity tibial artery disease.             -patient may shower but incision must be covered.              -ELOS/Goals: 1/18,   Supervision        con't CIR- PT and OT- pt says cannot get ramp by 1/18 and needs to extend stay- will d/w team at team conference today  1/20- con't CIR_ on hold today- has ramp now- arteriogram today.   1/21: patient will need amputation- discussed with vascular and patient and he prefers as soon as possible- plan for early next week  1/22: reinforced plan above.  2.  Antithrombotics: -DVT/anticoagulation:  Mechanical: Sequential compression devices, below knee Left lower extremity  1/19- on Heparin gtt- Eliquis held for  arteriogram.  Pharmaceutical: Other (comment) apixaban             -antiplatelet therapy: aspirin  3. Pain Management:  .  1/5-changed Norco to Oxycodone 5-10 mg q4 hours prn- s  1/13- pain seems controlled. Have discussed massage, vision rx   -he's tolerating shrinker 11/17- only taking 1-2x/day- con't regimen  1/19- having more pain- was resting LLE off bed- more swollen- asked him not to.   1/20- hadn't taken pain meds yet this Am- encouraged pt to do so.  1/21: changed oxycodone to 10mg  q4H prn as patient feels higher dose is more helpful to  him.  4. Mood: LCSW to evaluate and provide emotional support             -antipsychotic agents: Not applicable 5. Neuropsych: This patient is capable of making decisions on his own behalf. 6. Skin/Wound Care: tolerating shrinker ,using limb guard  -continue shrinker, +dressing if needed as he has occasional drainage No drainage, + edema, ecchymosis unchanged   1/19- R BKA having skin slough- also LLE looks worse- going for arteriogram tomorrow  1/20- arteriogram today- skin looks worse than yesterday 7. Fluids/Electrolytes/Nutrition: Fluid restriction, monitor intake and daily weight 8.  End-stage renal disease on chronic intermittent hemodialysis.  T/T/S -HD at end of day to avoid conflicting with therapy -volume mgt per nephrology team, has had issues with hypotension a few times after HD 1/19- HD today- will see if can get transfusion of 1 unit- HR 120s- could be be made worse by anemia.  9: Atrial fibrillation: Continue Lopressor, Eliquis  1/17- rate controlled- con't regimen  1/20- HR elevated- in RVR slightly- has received diltiazem for last 24 hours- will call Cards.  10: Hyperlipidemia: Continue Lipitor 11: COPD/asthma: albuterol neb PRN  -1/13 pO2 61 on ABG yesterday   -will need to go home on oxygen   -apparently has some equipment per above  1/16- Is on Home O2- will need to go home on Home O2- concerned about documentation of O2  sats of 60% this afternoon- on RA_ asked nursing to go eval pt and make sure if it's correct or not- and call me back if any issues  1/17- O2 drop yesterday was due to cold hands- sats in 90s- rewrote orders for home O2. 1/20- O2 levels dropping when takes O2 off.   12: Renal osteodystrophy: Renvela and Sensipar as per nephrology 13: Barrett's Esophagus: Continue PPI 14: Anemia, iron deficiency/chronic disease: Aransep and Venofer per nephrology. 15: Peripheral arterial disease s/p right BKA on 12/30.  --Was discussed with Dr. Delana Meyer (on call vascular surgeon for Dr. Lucky Cowboy) regarding antiplatelet therapy>>recommends aspirin 81 mg daily --Follow-up with Dr. Lucky Cowboy as outpatient  10. Depression 1/5- started Zoloft 50 mg daily for reactive depression 1/17- much brighter affect, but did mention will NOT go through another amputation.   1/19- Tearful- increased Zoloft to 100 mg daily 17. Low grade temp-   1/18- Labs look ok, no leukocytosis 18. Mottling of L foot due to PAD  1/18- vascualr taking for arteriogram Friday- on Heparin gtt- cannot do tx dose lovenox due to ESRD  1/19- arteriogram tomorrow- looking worse daily-  19. Hypotension  1/18- asking Renal if can do midodrine? 1/19- midodrine started yesterday- 10 mg TID_ working better 1/21: BP now fluctuation: continue midodrine 20. Constipation: change colace to TID and senna to 2 tabs HS. Add miralax daily.  21. Tachycardia: continue to monitor HR TID, transfer to telemetry floor.    >30 minutes spent in discharge of patient including discussing with cardiology, admitting hospitalist, our nursing staff, discussing constipation/hemorrhoids/pain/upcoming surgery with patient and OT   LOS: 18 days A FACE TO FACE EVALUATION WAS [place] 08/30/2021, 1:35 PM

## 2021-08-30 NOTE — Progress Notes (Signed)
Patient's IV removed due to infiltration and he refused echocardiogram x2. Dr. Ranell Patrick notified. No new orders at this time. Patient discharged from [facility] and admitted to 4C76 with no complications.

## 2021-08-30 NOTE — Assessment & Plan Note (Signed)
Stable, continue albuterol prn

## 2021-08-30 NOTE — Assessment & Plan Note (Signed)
Continue protonix daily. 

## 2021-08-30 NOTE — Assessment & Plan Note (Addendum)
Patient appears to have been placed on oxygen at 2-3L [place] from his hospital stay early in December, possibly after his surgery on 12/13, but no mention in progress note. On discharge summary stated patient evaluated for home oxygen -continue to be on 2-3L oxygen prn  -per rehab note will need home oxygen

## 2021-08-30 NOTE — Progress Notes (Signed)
Pt has very poor vasculature. Left arm restriction d/t fistula. Stuck twice with second time a success.Very difficult stick. LPN made aware of vasculature, as 2 VAST RN's assessed. Nephrology will need to be consulted for further PIV access.

## 2021-08-30 NOTE — Assessment & Plan Note (Signed)
Followed by cardiology Recommended transfer to floor for telemetry to make sure rate okay prior to surgery Continue metoprolol and cardizem Heparin gtt and can transition back to eliquis post BKA

## 2021-08-30 NOTE — Assessment & Plan Note (Signed)
ESRD with hx of renal transplant in 1985 Nephrology has been following while in rehab and Dr.Schertz made aware he has moved rooms to inpatient Continue dialysis per nephrology Intake/output, follow renal function

## 2021-08-30 NOTE — Progress Notes (Signed)
Occupational Therapy Note  Patient Details  Name: Scott Castro MRN: [id number] Date of Birth: Dec 29, 1955  Occupational Therapy Discharge Note  This patient was unable to complete the inpatient rehab program due to medical reasons, therefore did not meet their long term goals. Pt left the program at a Min assist level for their functional ADLs. This patient is being discharged from OT services at this time.  BIMS at time of d/c  Pt unable to complete due to medical status  See CareTool for functional status details.  If the patient is able to return to inpatient rehabilitation within 3 midnights, this may be considered an interrupted stay and therapy services will resume as ordered. Modification and reinstatement of their goals will be made upon completion of therapy service reevaluations.    [name] 08/30/2021, 6:41 PM

## 2021-08-30 NOTE — Assessment & Plan Note (Signed)
LDL checked yesterday and 34 Continue statin

## 2021-08-30 NOTE — Assessment & Plan Note (Signed)
Baseline around 10, continue to monitor IV iron per nephrology

## 2021-08-30 NOTE — Assessment & Plan Note (Addendum)
-   Angiography 1/20 by Dr. [name] revealed diffuse infrapopliteal disease on the left with no revascularization options.  -currently plan of left BKA early next week -on IV heparin for a fib -statin   -ASA on hold

## 2021-08-31 ENCOUNTER — Encounter (HOSPITAL_COMMUNITY): Payer: Self-pay | Admitting: Vascular Surgery

## 2021-08-31 ENCOUNTER — Other Ambulatory Visit: Payer: Self-pay

## 2021-08-31 ENCOUNTER — Inpatient Hospital Stay (HOSPITAL_COMMUNITY): Payer: Medicare Other

## 2021-08-31 DIAGNOSIS — I10 Essential (primary) hypertension: Secondary | ICD-10-CM

## 2021-08-31 DIAGNOSIS — Z515 Encounter for palliative care: Secondary | ICD-10-CM

## 2021-08-31 DIAGNOSIS — N186 End stage renal disease: Secondary | ICD-10-CM

## 2021-08-31 DIAGNOSIS — I480 Paroxysmal atrial fibrillation: Secondary | ICD-10-CM

## 2021-08-31 DIAGNOSIS — I70222 Atherosclerosis of native arteries of extremities with rest pain, left leg: Secondary | ICD-10-CM

## 2021-08-31 DIAGNOSIS — R011 Cardiac murmur, unspecified: Secondary | ICD-10-CM

## 2021-08-31 LAB — HEPARIN LEVEL (UNFRACTIONATED): Heparin Unfractionated: 0.9 IU/mL — ABNORMAL HIGH (ref 0.30–0.70)

## 2021-08-31 LAB — BASIC METABOLIC PANEL
Anion gap: 13 (ref 5–15)
BUN: 30 mg/dL — ABNORMAL HIGH (ref 8–23)
CO2: 24 mmol/L (ref 22–32)
Calcium: 7.6 mg/dL — ABNORMAL LOW (ref 8.9–10.3)
Chloride: 94 mmol/L — ABNORMAL LOW (ref 98–111)
Creatinine, Ser: 5.01 mg/dL — ABNORMAL HIGH (ref 0.61–1.24)
GFR, Estimated: 12 mL/min — ABNORMAL LOW (ref >60)
Glucose, Bld: 96 mg/dL (ref 70–99)
Potassium: 3.9 mmol/L (ref 3.5–5.1)
Sodium: 131 mmol/L — ABNORMAL LOW (ref 135–145)

## 2021-08-31 LAB — ECHOCARDIOGRAM COMPLETE
AR max vel: 2.14 cm2
AV Area VTI: 2.21 cm2
AV Area mean vel: 1.93 cm2
AV Mean grad: 3 mmHg
AV Peak grad: 5.8 mmHg
Ao pk vel: 1.2 m/s
Area-P 1/2: 4.1 cm2
P 1/2 time: 629 msec
S' Lateral: 3.8 cm
Weight: 2479.73 oz

## 2021-08-31 LAB — APTT: aPTT: 96 seconds — ABNORMAL HIGH (ref 24–36)

## 2021-08-31 IMAGING — DX DG CHEST 1V PORT
1 series · 1 of 1 positions shown · non-contrast
Comparison: [DATE] and CT chest [DATE].

CLINICAL DATA: Hypoxemia.

EXAM:
PORTABLE CHEST 1 VIEW

[chest ap]
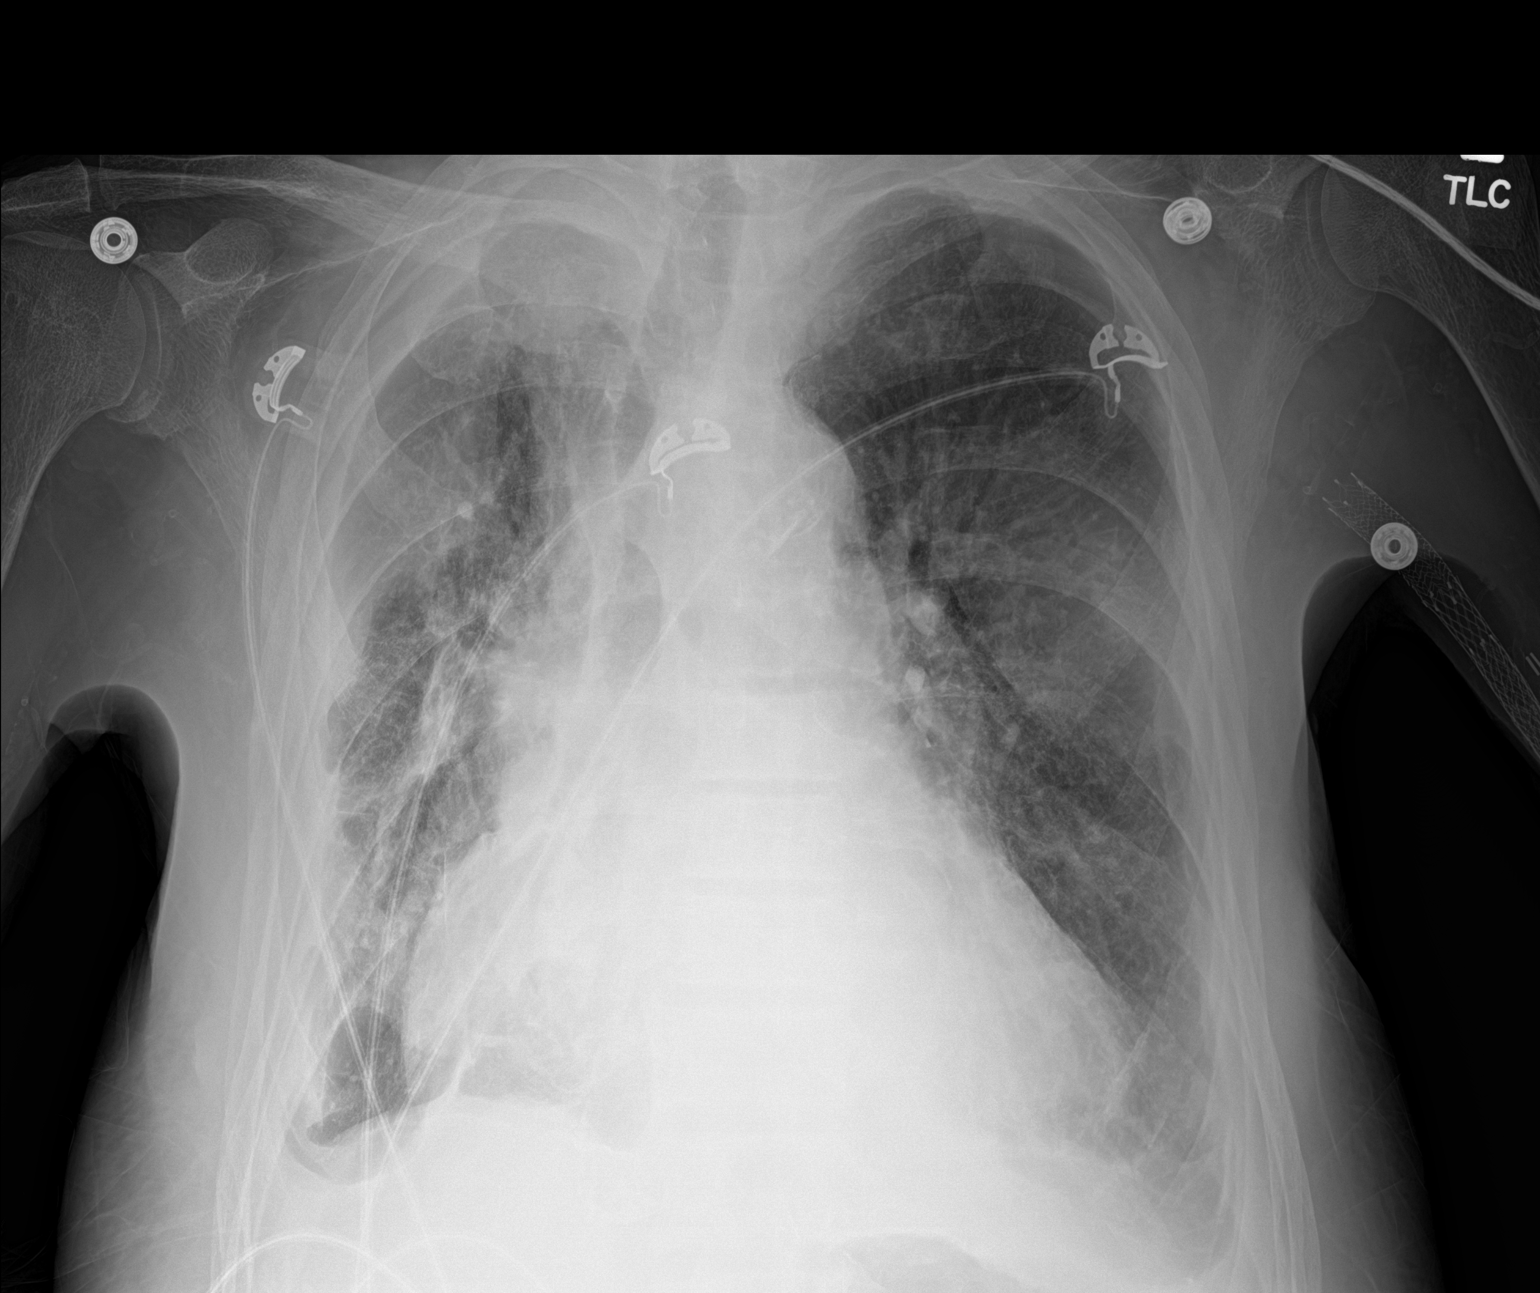

[1 of 1 positions shown; findings below may reference images not displayed]

FINDINGS: Trachea is midline. Heart is enlarged. Loculated pleural effusions
bilaterally with scattered airspace opacities, right greater than
left.
IMPRESSION: Moderate loculated bilateral pleural effusions with scattered
associated volume loss.

## 2021-08-31 IMAGING — CT CT CHEST W/O CM
2 of 3 series · 13 of 36 positions shown, 16 images · non-contrast
Comparison: [DATE] and high-resolution chest CT from
[DATE].

CLINICAL DATA: Loculated LEFT pleural effusion



[Series 4: chest w/o 2mm st · axial · non-contrast · 0.65mm/px · z∈[+1258,+1540]mm · 10 of 167 slices shown, 13 images]
[im 13/167  mediastinal]
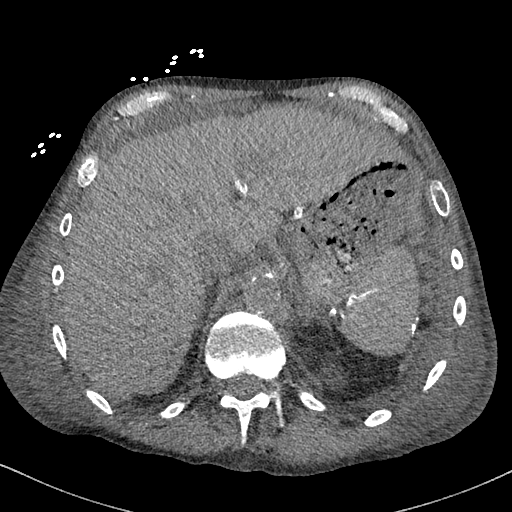
[im 13/167  lung]
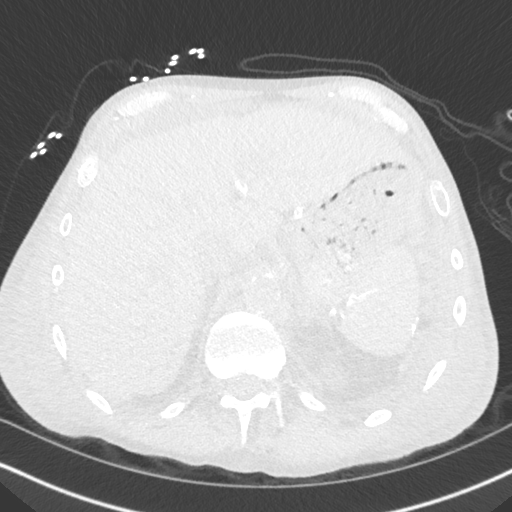
[im 25/167  lung]
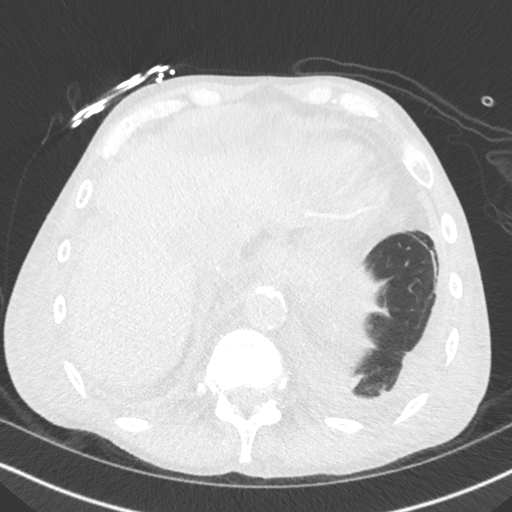
[im 44/167  lung]
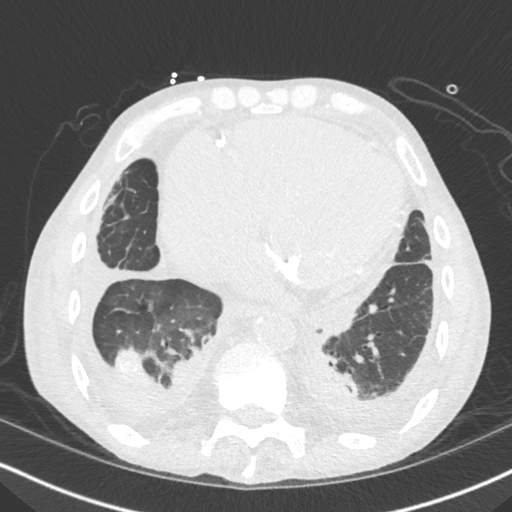
[im 62/167  lung]
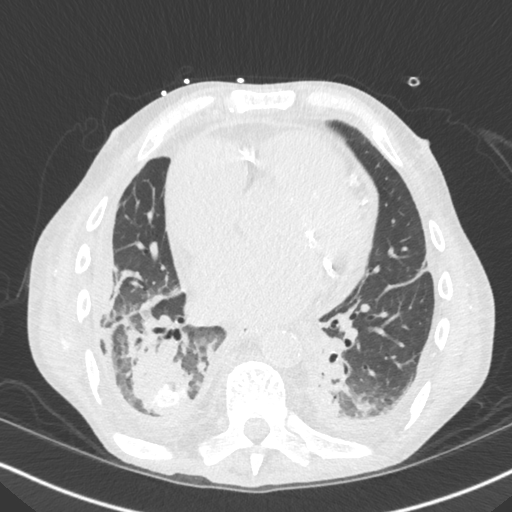
[im 74/167  mediastinal]
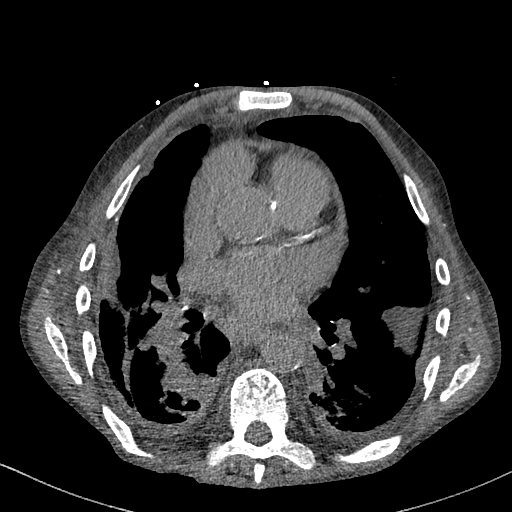
[im 74/167  lung]
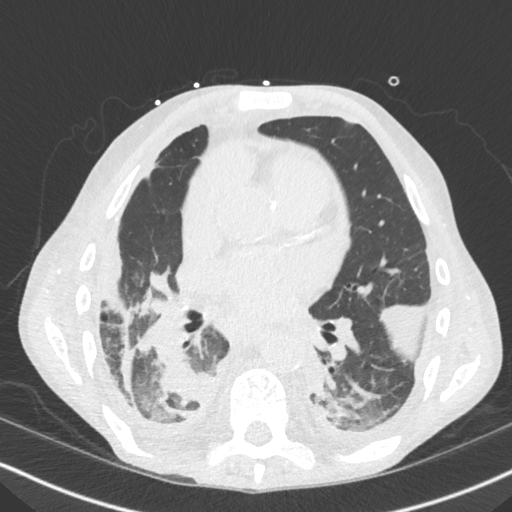
[im 93/167  lung]
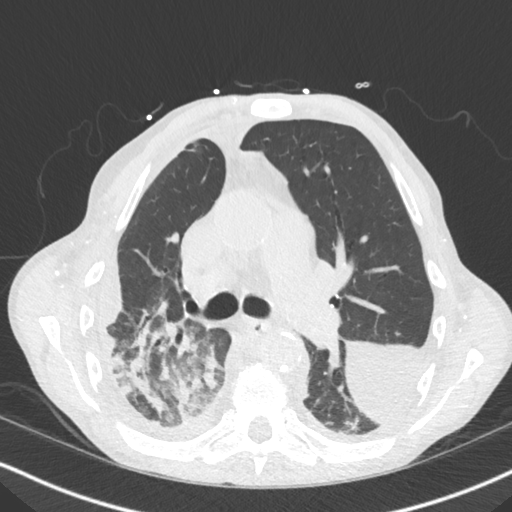
[im 105/167  lung]
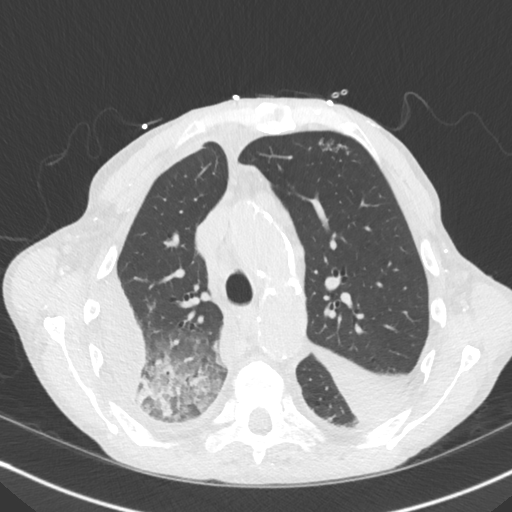
[im 123/167  lung]
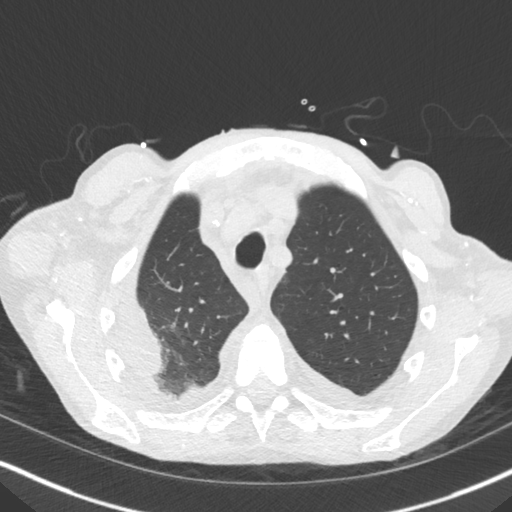
[im 142/167  mediastinal]
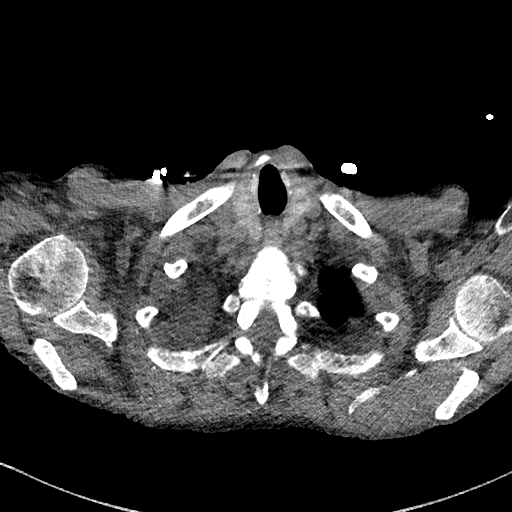
[im 142/167  lung]
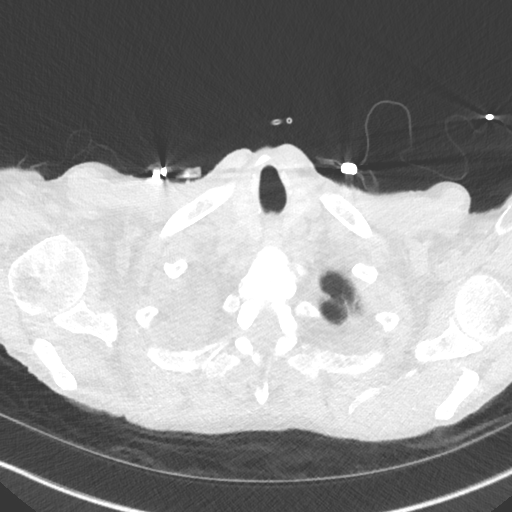
[im 154/167  lung]
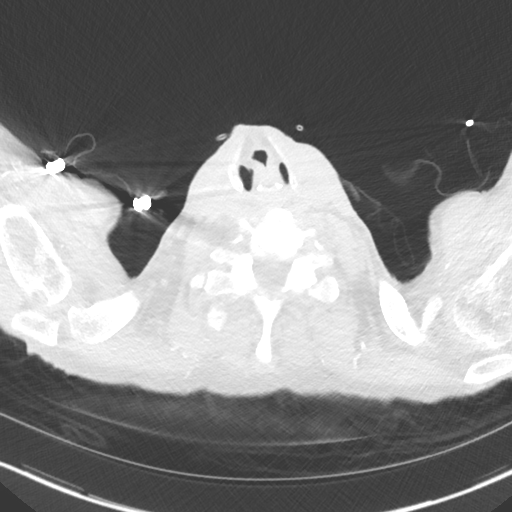

[Series 5: chest w/o 2mm st cor · coronal · non-contrast · 0.60mm/px · 3 of 124 slices shown]
[im 25/124  lung]
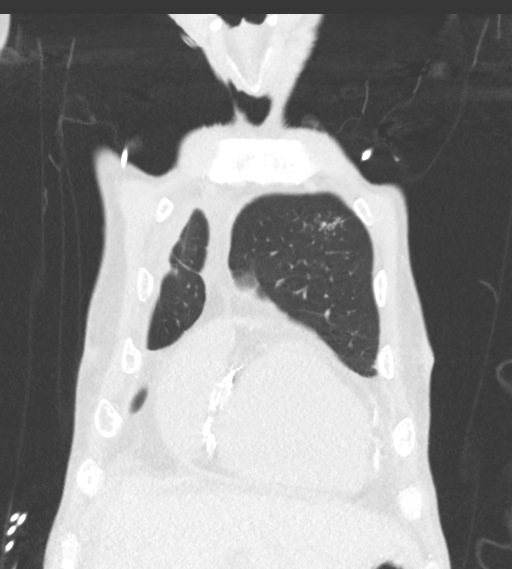
[im 50/124  lung]
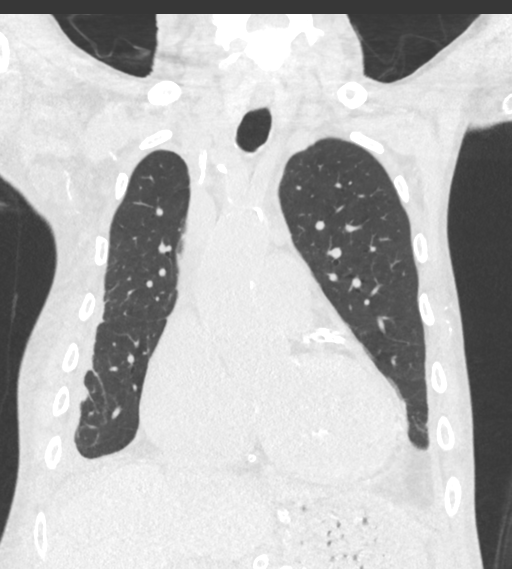
[im 74/124  lung]
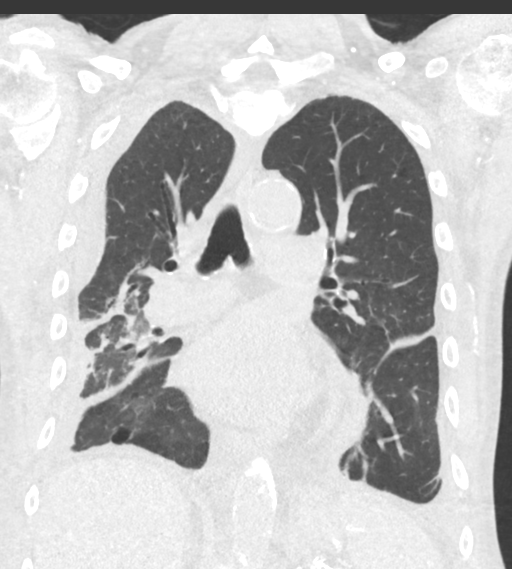

[13 of 36 positions shown; findings below may reference images not displayed]

FINDINGS: Cardiovascular: Calcified atheromatous plaque of the thoracic aorta
without aneurysmal dilation. Moderate cardiomegaly without
substantial change. Small amount of pericardial fluid is diminished
compared to previous imaging. Three-vessel coronary artery disease,
extensive coronary artery calcifications as before. Mitral annular
calcifications. Central pulmonary vasculature 3.3 cm similar to
previous imaging.

Mediastinum/Nodes: 18 mm RIGHT paratracheal lymph node, previously
16 mm (image 70/4)

Small pre-vascular lymph nodes less than a cm are stable, enlarged
compared to more remote studies.

Mildly enlarged paratracheal lymph nodes also with slight
previously 12 mm. Similar slight enlargement of other lymph nodes
throughout the RIGHT paratracheal chain.

Mild subcarinal nodal enlargement approximately 12 mm short axis is
similar to prior imaging. No hilar adenopathy to the extent
evaluated on noncontrast imaging. The esophagus is normal on limited
assessment. No axillary adenopathy.

Lungs/Pleura: Lenticular loculated RIGHT-sided pleural effusion
along the lateral RIGHT chest (image 56/4) minimal fluid in this
location before now measuring 7.2 x 2.9 cm greatest axial dimension,
contiguous with loculated pleural fluid in the inferior RIGHT chest.
Suspect mild underlying nodularity. Pleural fluid is circumferential
in the RIGHT chest and increased in the medial RIGHT upper chest
(image 36/4) 15 mm short axis, thickness of pleural fluid at the
RIGHT lung apex. Other areas of loculated pleural fluid in the
inferior RIGHT chest with similar appearance. Suspected rounded
atelectasis amidst chronic pleural fluid in the RIGHT lower chest is
similar. Loculated pleural fluid in the major fissure in the LEFT
chest is diminished in size (image 77/4) 5.8 x 5.1 cm as compared to
10.0 x 5.2 cm.

Chronic appearing volume loss at the LEFT lung base also similar.
Interstitial thickening and ground-glass with nodularity has
developed in the posterior RIGHT upper lobe since previous imaging
and generalized interstitial thickening and ground-glass also
present in the middle lobe as well as the LEFT lower lobe.
Subcentimeter areas of nodularity amidst these changes.

In addition a rounded atelectasis there is further increase in soft
tissue that tracks towards the RIGHT hilum, this area measuring
x 4.0 cm (image 101/3) RIGHT of atelectasis peripheral to this is
stable as outlined above.

Upper Abdomen: Intra-abdominal ascites partially imaged. Aortic
atherosclerosis tracks in the visceral branches in the upper
abdomen, partially evaluated. Signs of marked renal atrophy not well
assessed and incompletely imaged.

Musculoskeletal: Signs of renal osteodystrophy. No acute or
destructive bone finding.
IMPRESSION: 1. Patchy nodularity and ground-glass in the dependent RIGHT upper
lobe and bilateral lower lobes raising the question of pneumonitis
with distribution raising the question of sequela of aspiration.
2. More dense consolidative changes and irregular bronchial
structures in the RIGHT lower lobe central to an area of rounded
atelectasis is of uncertain significance. Given the more masslike
appearance in this area and increasing size of mediastinal lymph
nodes over time a possibility of neoplasm is considered. PET scan
may be helpful on follow-up in addition to contrasted imaging with
CT for further assessment.
3. Loculated and mildly nodular appearing pleural fluid in the RIGHT
chest with increase and with decreased pleural fluid in the LEFT
chest compared to prior imaging. This could also be further assessed
with contrasted CT of the chest or PET as warranted.
4. Signs of end-stage renal disease.

Aortic Atherosclerosis ([7K]-[7K]).

## 2021-08-31 MED ORDER — SEVELAMER CARBONATE 800 MG PO TABS
800.0000 mg | ORAL_TABLET | Freq: Three times a day (TID) | ORAL | Status: DC | PRN
  Administered 2021-09-03: 08:00:00 800 mg via ORAL
  Filled 2021-08-31: qty 1

## 2021-08-31 NOTE — Consult Note (Addendum)
NAME:  Scott Castro, MRN:  [id number], DOB:  October 28, 1955, LOS: 1 ADMISSION DATE:  08/30/2021, CONSULTATION DATE:  08/31/21 REFERRING MD:  Tyrell Antonio - TRH, CHIEF COMPLAINT:  Abnormal CXR    History of Present Illness:   66 yo M PMH ESRD, HTN, Afib on eliquis, COPD with chronic hypoxic respiratory failure on 2-3LNC, PAD/critical limb ischemia BLE s/p R BKA (08/07/21) during hospitalization at [facility] 12/28-1/4, who presented to [facility] from inpt rehab on 08/30/21 for L BKA. Admitted to [facility] and planned for L BKA 1/24. On 1/23 a CXR was obtained which revealed R>L pleural effusions. PCCM was consulted in this setting   In review of prior imaging and records: CT chest performed 07/29/21 which had increase in size of L sided loculated appearing pleural fluid, R sided loculated appearing pleural effusion, pleural thickening, R sided subpleural scarring, enlarged lymph nodes (16mmR paratracheal node, 26mm subcarinal, scattered throughout chest), small pericardial effusion.   Bilateral pleural effusions were noted on 2020 HRCT, at which time their appearance was felt c/w asbestosis   In review of pt outpatient pulm appointment 08/05/21, physician references pt was not inclined to pursue chest tube drainage or VATS.   Labs on admission: Na 131, K 3.9, Hgb 10.4, plt 287    Pertinent  Medical History  ESRD Chronic bilateral pleural effusion COPD Chronic hypoxic respiratory failure  Afib on eliquis  PAD Critical limb ischemia S/p R BKA  HTN  Pericardial effusion HLD  Significant Hospital Events: Including procedures, antibiotic start and stop dates in addition to other pertinent events   1/22 admitted to [facility] from [place] for treatment of L PAD / critical limb ischemia. Cardiology following for Afib  1/23 PCCM consulted for evaluation of bilateral pleural effusions. Palliative consulted.   Interim History / Subjective:  ECHO just completed   [place] weaned to 2L with SpO2 100%  Spoke with patient about pleural  effusions -- discussed previous decision not to pursue chest tube vs VATS. It seems this was largely due to pt not understanding these interventions (was under impression that both of these resulted in a permanent chest tube). He is amenable to further evaluation to determine possible intervention   Objective   Blood pressure 110/80, pulse (!) 112, temperature 98.3 F (36.8 C), temperature source Oral, resp. rate 19, SpO2 95 %.        Intake/Output Summary (Last 24 hours) at 08/31/2021 1357 Last data filed at 08/31/2021 0857 Gross per 24 hour  Intake 669.8 ml  Output 0 ml  Net 669.8 ml   There were no vitals filed for this visit.  Examination: General: Chronically ill appearing middle aged M reclined in bed NAD  HENT: NCAT pink mm anicteric sclera  Lungs: Diminished basilar sounds. Even and unlabored on [facility] Cardiovascular: tachycardic. Systolic murmur.  Abdomen: soft ndnt + bowel sounds  Extremities: R BKA. LLE pitting edema. L foot cyanotic and ischemic appearing. LUE fistula  Neuro: AAOx3 following commands GU: defer   Resolved Hospital Problem list     Assessment & Plan:   Loculated Right Pleural Effusion  Acute on Chronic Hypoxic Respiratory Failure  COPD Hx Left Loculated Effusion 07/2020 -Historically, small effusions felt to be c/w asbestosis sequelae though no known exposure  -Recent CT in 07/2021 with loculated effusion on left, seen at [facility] & pt elected to not drain with chest tube or VATS.   -On CXR this admission, L sided effusion appears somewhat improved compared to CT, and R sided effusion appears  worse  -is afebrile and without leukocytosis  -pt open to considering intervention  P -repeat CT chest before we can really assess possible intervention  -cont pulm hygiene, IS  -no role for abx at this time  -O2 88-92   Other problems: Critical limb ischemia / PAD LLE- will have L BKA this admission, timing per VVS.  Afib - on hep gtt acutely. Outpt on  eliquis Systolic murmur - follow up echo ESRD - HD per nephro  Barretts esophagus - PPI HLD - statin   Best Practice (right click and "Reselect all SmartList Selections" daily)   Per primary   Labs   CBC: Recent Labs  Lab 08/26/21 1004 08/27/21 0515 08/27/21 1830 08/28/21 0441 08/29/21 0451 08/30/21 0652  WBC 7.5 6.1 6.8 5.4 6.6 8.4  NEUTROABS 5.7  --   --   --   --   --   HGB 7.2* 7.9* 7.5* 8.4* 8.0* 10.4*  HCT 23.1* 25.9* 24.0* 26.5* 26.0* 33.1*  MCV 99.1 97.0 98.8 95.3 96.7 97.6  PLT 276 332 368 317 294 465    Basic Metabolic Panel: Recent Labs  Lab 08/25/21 1639 08/26/21 1004 08/27/21 1830 08/29/21 1629 08/31/21 0145  NA 126* 131* 128* 133* 131*  K 4.2 3.6 4.1 3.7 3.9  CL 93* 95* 92* 98 94*  CO2 24 27 23 24 24   GLUCOSE 112* 134* 104* 144* 96  BUN 56* 31* 49* 38* 30*  CREATININE 7.80* 4.82* 6.45* 5.82* 5.01*  CALCIUM 6.4* 7.3* 7.2* 7.3* 7.6*  PHOS  --  2.7 3.5 3.2  --    GFR: Estimated Creatinine Clearance: 14.4 mL/min (A) (by C-G formula based on SCr of 5.01 mg/dL (H)). Recent Labs  Lab 08/27/21 1830 08/28/21 0441 08/29/21 0451 08/30/21 0652  WBC 6.8 5.4 6.6 8.4    Liver Function Tests: Recent Labs  Lab 08/26/21 1004 08/27/21 1830 08/29/21 1629  ALBUMIN 1.8* 1.8* 1.9*   No results for input(s): LIPASE, AMYLASE in the last 168 hours. No results for input(s): AMMONIA in the last 168 hours.  ABG    Component Value Date/Time   PHART 7.474 (H) 08/20/2021 1217   PCO2ART 35.8 08/20/2021 1217   PO2ART 61.3 (L) 08/20/2021 1217   HCO3 26.0 08/20/2021 1217   O2SAT 92.0 08/20/2021 1217     Coagulation Profile: No results for input(s): INR, PROTIME in the last 168 hours.  Cardiac Enzymes: No results for input(s): CKTOTAL, CKMB, CKMBINDEX, TROPONINI in the last 168 hours.  HbA1C: No results found for: HGBA1C  CBG: Recent Labs  Lab 08/26/21 0811  GLUCAP 162*    Review of Systems:   Review of Systems  Constitutional: Negative.    HENT: Negative.    Eyes: Negative.   Respiratory: Negative.    Cardiovascular:  Negative for chest pain, palpitations and orthopnea.  Gastrointestinal: Negative.   Musculoskeletal:        LLE swelling  Skin:        L foot discoloration   Neurological: Negative.   Endo/Heme/Allergies: Negative.   Psychiatric/Behavioral:  Positive for depression.     Past Medical History:  He,  has a past medical history of Barrett's esophagus, Chronic kidney disease, Glomerulosclerosis, focal, and Hyperparathyroidism due to renal insufficiency ([place]).   Surgical History:   Past Surgical History:  Procedure Laterality Date   ABDOMINAL AORTOGRAM W/LOWER EXTREMITY N/A 08/28/2021   Procedure: ABDOMINAL AORTOGRAM W/LOWER EXTREMITY;  Surgeon: Cherre Robins, MD;  Location: [place] CV LAB;  Service: Cardiovascular;  Laterality: N/A;   AMPUTATION Right 08/07/2021   Procedure: AMPUTATION BELOW KNEE;  Surgeon: Algernon Huxley, MD;  Location: [facility] ORS;  Service: Vascular;  Laterality: Right;   AV FISTULA PLACEMENT     COLONOSCOPY N/A 03/22/2016   Procedure: COLONOSCOPY;  Surgeon: Manya Silvas, MD;  Location: [facility] ENDOSCOPY;  Service: Endoscopy;  Laterality: N/A;   DG AV DIALYSIS GRAFT DECLOT OR     ESOPHAGOGASTRODUODENOSCOPY (EGD) WITH PROPOFOL  03/22/2016   Procedure: ESOPHAGOGASTRODUODENOSCOPY (EGD) WITH PROPOFOL;  Surgeon: Manya Silvas, MD;  Location: [facility] ENDOSCOPY;  Service: Endoscopy;;   FLEXIBLE BRONCHOSCOPY     KIDNEY TRANSPLANT Right 1985   LOWER EXTREMITY ANGIOGRAPHY Right 07/20/2021   Procedure: Lower Extremity Angiography;  Surgeon: Algernon Huxley, MD;  Location: [place] CV LAB;  Service: Cardiovascular;  Laterality: Right;   LOWER EXTREMITY ANGIOGRAPHY Right 08/06/2021   Procedure: Lower Extremity Angiography;  Surgeon: Algernon Huxley, MD;  Location: [place] CV LAB;  Service: Cardiovascular;  Laterality: Right;   REMOVAL TENCKHOFF CATH       Social History:   reports that he  has never smoked. He has never used smokeless tobacco. He reports that he does not currently use alcohol. He reports that he does not use drugs.   Family History:  His family history includes Heart disease in his father and mother.   Allergies No Known Allergies   Home Medications  Prior to Admission medications   Medication Sig Start Date End Date Taking? Authorizing Provider  albuterol (VENTOLIN HFA) 108 (90 Base) MCG/ACT inhaler Inhale 2 puffs into the lungs every 6 (six) hours as needed for wheezing or shortness of breath. 07/08/21   Enzo Bi, MD  apixaban (ELIQUIS) 5 MG TABS tablet Take 1 tablet (5 mg total) by mouth 2 (two) times daily. 07/22/21   Sharen Hones, MD  atorvastatin (LIPITOR) 10 MG tablet Take 1 tablet (10 mg total) by mouth at bedtime. 07/22/21   Sharen Hones, MD  cinacalcet (SENSIPAR) 60 MG tablet Take 60 mg by mouth daily.    [provider]  diclofenac Sodium (VOLTAREN) 1 % GEL Apply 2 g topically 4 (four) times daily. Use on feet    [provider]  diltiazem (CARDIZEM CD) 120 MG 24 hr capsule Take 1 capsule (120 mg total) by mouth daily. Hold for sbp < 100 08/12/21   Antonieta Pert, MD  feeding supplement (ENSURE ENLIVE / ENSURE PLUS) LIQD Take 237 mLs by mouth 2 (two) times daily between meals. 08/12/21   Antonieta Pert, MD  gabapentin (NEURONTIN) 100 MG capsule Take 1 capsule (100 mg total) by mouth 2 (two) times daily. 08/12/21   Antonieta Pert, MD  HYDROcodone-acetaminophen (NORCO) 7.5-325 MG tablet Take 1 tablet by mouth 3 (three) times daily as needed. 08/05/21   [provider]  methocarbamol (ROBAXIN) 500 MG tablet Take 1 tablet (500 mg total) by mouth every 8 (eight) hours as needed for muscle spasms. 08/12/21   Antonieta Pert, MD  metoprolol tartrate (LOPRESSOR) 25 MG tablet Take 0.5 tablets (12.5 mg total) by mouth 2 (two) times daily. 08/12/21   Antonieta Pert, MD  pantoprazole (PROTONIX) 40 MG tablet Take 40 mg by mouth daily. 03/27/19   [provider]  polyethylene glycol (MIRALAX / GLYCOLAX) 17 g packet Take 17 g by mouth daily as needed. 08/12/21   Antonieta Pert, MD  senna-docusate (SENOKOT-S) 8.6-50 MG tablet Take 2 tablets by mouth 2 (two) times daily. 08/12/21   Antonieta Pert,  MD  sevelamer carbonate (RENVELA) 800 MG tablet Take by mouth See admin instructions. Take 2400 mg by mouth three times daily, 1600 mg with snacks    [provider]     Critical care time: n/a       Eliseo Gum MSN, AGACNP-BC [place] for pager  08/31/2021, 3:07 PM

## 2021-08-31 NOTE — Progress Notes (Signed)
Incentive spirometer given to patient MD requested. Pt demonstrated and was compliant. 750 is baseline   Chrisandra Carota, RN 08/31/2021 3:55 PM

## 2021-08-31 NOTE — Evaluation (Signed)
Physical Therapy Evaluation Patient Details Name: Scott Castro MRN: [id number] DOB: [birth year] Today's Date: 08/31/2021  History of Present Illness  The pt is a 66 yo male presenting on 1/20 from [facility] with bilateral diffuse infrapopliteal disease of the left lower extremity, vascular is recommending left BKA. Recent hospitalization at [facility] (08/05/21-08/12/21) for R BKA on 12/30 with d/c to [facility]. PMH including  ESRD on dialysis TTS, hypertension, hyperlipidemia, PAF, depression, and chronic respiratory failure 2 L of oxygen.   Clinical Impression  Pt in bed upon arrival of PT, agreeable to evaluation at this time. Prior to admission the pt was mobilizing with minA for pivot and lateral scoot transfers at acute inpatient rehab. The pt continues to present with good ability to complete lateral scoot transfers, and was educated on use of slide board in preparation for L BKA on 1/25. At this time recommend return to [facility] after planned amputation, but will continue to assess for pt mobility and ability to return home after surgery. Currently, he would require frequent assist and DME listed below to return home safely.         Recommendations for follow up therapy are one component of a multi-disciplinary discharge planning process, led by the attending physician.  Recommendations may be updated based on patient status, additional functional criteria and insurance authorization.  Follow Up Recommendations Acute inpatient rehab (3hours/day)    Assistance Recommended at Discharge Frequent or constant Supervision/Assistance  Patient can return home with the following  A little help with walking and/or transfers;A little help with bathing/dressing/bathroom;Assistance with cooking/housework;Assistance with feeding;Assist for transportation    Equipment Recommendations Wheelchair (measurements PT);Wheelchair cushion (measurements PT) (slide board)  Recommendations for Other Services  Rehab consult     Functional Status Assessment Patient has had a recent decline in their functional status and demonstrates the ability to make significant improvements in function in a reasonable and predictable amount of time.     Precautions / Restrictions Precautions Precautions: Fall Restrictions Weight Bearing Restrictions: Yes RLE Weight Bearing: Non weight bearing Other Position/Activity Restrictions: s/p R BKA      Mobility  Bed Mobility Overal bed mobility: Needs Assistance Bed Mobility: Supine to Sit     Supine to sit: Supervision, +2 for safety/equipment     General bed mobility comments: Supervision    Transfers Overall transfer level: Needs assistance Equipment used: Sliding board Transfers: Bed to chair/wheelchair/BSC            Lateral/Scoot Transfers: Min guard, +2 safety/equipment, With slide board General transfer comment: pt directed in use of slide board and was able to perform with minA to balance and adjust position.          Balance Overall balance assessment: Needs assistance Sitting-balance support: Feet supported, No upper extremity supported Sitting balance-Leahy Scale: Fair                                       Pertinent Vitals/Pain Pain Assessment Pain Assessment: Faces Faces Pain Scale: Hurts little more Pain Location: L foot Pain Descriptors / Indicators: Discomfort, Grimacing Pain Intervention(s): Monitored during session, Repositioned    Home Living Family/patient expects to be discharged to:: Private residence Living Arrangements: Alone Available Help at Discharge: Friend(s);Neighbor;Available PRN/intermittently Type of Home: House Home Access: Ramped entrance       Home Layout: One level        Prior Function Prior Level of  Function : Needs assist             Mobility Comments: Was practicing transfers with RW and therapy staff. ADLs Comments: Was working on ADLs with therapists and staff. Reaching Min A  level     Hand Dominance        Extremity/Trunk Assessment   Upper Extremity Assessment Upper Extremity Assessment: Defer to OT evaluation    Lower Extremity Assessment Lower Extremity Assessment: RLE deficits/detail;LLE deficits/detail RLE Deficits / Details: S/p R BKA LLE Deficits / Details: Planning for L amputation, multiple wounds covered in dressings on LLE, pt able to move against gravity    Cervical / Trunk Assessment Cervical / Trunk Assessment: Normal  Communication   Communication: No difficulties  Cognition Arousal/Alertness: Awake/alert Behavior During Therapy: WFL for tasks assessed/performed Overall Cognitive Status: Within Functional Limits for tasks assessed                                          General Comments General comments (skin integrity, edema, etc.): VSS on RA    Exercises     Assessment/Plan    PT Assessment Patient needs continued PT services  PT Problem List Decreased strength;Decreased mobility;Decreased safety awareness;Decreased balance;Decreased activity tolerance;Decreased knowledge of use of DME;Pain;Impaired sensation;Cardiopulmonary status limiting activity;Decreased skin integrity;Decreased range of motion;Decreased knowledge of precautions       PT Treatment Interventions DME instruction;Therapeutic exercise;Wheelchair mobility training;Gait training;Balance training;Manual techniques;Stair training;Neuromuscular re-education;Modalities;Functional mobility training;Therapeutic activities;Patient/family education    PT Goals (Current goals can be found in the Care Plan section)  Acute Rehab PT Goals Patient Stated Goal: go to acute rehab PT Goal Formulation: With patient Time For Goal Achievement: 09/14/21 Potential to Achieve Goals: Good    Frequency Min 3X/week     Co-evaluation PT/OT/SLP Co-Evaluation/Treatment: Yes Reason for Co-Treatment: To address functional/ADL transfers;For patient/therapist  safety PT goals addressed during session: Mobility/safety with mobility;Balance;Proper use of DME OT goals addressed during session: ADL's and self-care       AM-PAC PT "6 Clicks" Mobility  Outcome Measure Help needed turning from your back to your side while in a flat bed without using bedrails?: A Little Help needed moving from lying on your back to sitting on the side of a flat bed without using bedrails?: A Little Help needed moving to and from a bed to a chair (including a wheelchair)?: A Little Help needed standing up from a chair using your arms (e.g., wheelchair or bedside chair)?: A Lot Help needed to walk in hospital room?: Total Help needed climbing 3-5 steps with a railing? : Total 6 Click Score: 13    End of Session Equipment Utilized During Treatment: Oxygen Activity Tolerance: Patient tolerated treatment well Patient left: in chair;with call bell/phone within reach Nurse Communication: Mobility status PT Visit Diagnosis: Unsteadiness on feet (R26.81);Difficulty in walking, not elsewhere classified (R26.2);Muscle weakness (generalized) (M62.81);Other abnormalities of gait and mobility (R26.89)    Time: 2725-3664 PT Time Calculation (min) (ACUTE ONLY): 24 min   Charges:   PT Evaluation $PT Eval Low Complexity: 1 Low          West Carbo, PT, DPT   Acute Rehabilitation Department Pager #: [phone]  Sandra Cockayne 08/31/2021, 5:17 PM

## 2021-08-31 NOTE — Progress Notes (Signed)
PROGRESS NOTE    Scott Castro  XYI:[id number] DOB: [birth year] DOA: 08/30/2021 PCP: Franciso Bend, MD   Brief Narrative: 66 year old past medical history significant for ESRD on dialysis TTS, hypertension, hyperlipidemia, PAF, depression, chronic respiratory failure 2 L of oxygen, PAD recent right BKA on 08/07/2021 secondary to critical limb ischemia discharged to rehab.  He was seen by cardiology in inpatient rehab due to A. fib RVR and he was referred for inpatient telemetry admission.  Recent hospitalized at [facility] from 12/28 until 1//2023 right lower extremity critical limb ischemia status post BKA by vascular on 12/30.  Was just discharged to inpatient rehab here at [facility].  Found to be in RVR RVR admitted for further evaluation.  Patient  also had angiography 1/20 by Dr. [name] with BL diffuse infrapopliteal disease of the left lower extremity, vascular is recommending left BKA early this week.  Patient noted to be more hypoxic this morning, chest x-ray showed bilateral loculated pleural effusion, pulmonologist consulted for further evaluation  Assessment & Plan:   Principal Problem:   Critical limb ischemia of left lower extremity/PAD  Active Problems:   COPD (chronic obstructive pulmonary disease) (HCC)   Chronic respiratory failure with hypoxia (HCC)   ESRD (end stage renal disease) ([place])   Barrett esophagus   Anemia   Mixed hyperlipidemia   S/P BKA (below knee amputation), right (HCC)   Depressive reaction   AF (paroxysmal atrial fibrillation) (HCC)  1-Critical limb ischemia of left lower extremity/PAD -Angiography 1/20 by Dr. Luan Pulling revealed diffuse infrapopliteal disease of the left with no revascularization options -Plan for left BKA 1/25th -Currently on heparin drip for A. Fib -Recent BKA on the right 12/22   2-A. Fib Heparin drip Continue with  metoprolol and Cardizem.   3-Acute on chronic hypoxic respiratory failure Loculated bilateral moderate pleural  effusion COPD -Pulmonologist consulted -Incentive Spirometry ordered -Continue with nebulizer as needed  4-ESRD on hemodialysis -S/p renal transplant [place] with hemodialysis -Nephrology  following   Barrett esophagus: Continue with PPI  Anemia:  anemia of chronic disease: IV iron per nephrology  Hyperlipidemia: Continue with statins  Depression: Continue with Zoloft  Hyponatremia; fluid management with HD>     Pressure Injury 08/26/21 Heel Left Deep Tissue Pressure Injury - Purple or maroon localized area of discolored intact skin or blood-filled blister due to damage of underlying soft tissue from pressure and/or shear. (Active)  08/26/21 1826  Location: Heel  Location Orientation: Left  Staging: Deep Tissue Pressure Injury - Purple or maroon localized area of discolored intact skin or blood-filled blister due to damage of underlying soft tissue from pressure and/or shear.  Wound Description (Comments):   Present on Admission: No                  Estimated body mass index is 22.89 kg/m as calculated from the following:   Height as of 08/12/21: 5\' 9"  (1.753 m).   Weight as of an earlier encounter on 08/30/21: 70.3 kg.   DVT prophylaxis: Heparin Gtt Code Status: Full code Family Communication: care discussed with patient  Disposition Plan:  Status is: Inpatient  Remains inpatient appropriate because: management of critical limb ischemia. Resp failure        Consultants:  Nephrology Cardiology Vascular.  Pulmonologist   Procedures:    Antimicrobials:    Subjective: He report breathing better with oxygen on. Denies chest pain   Objective: Vitals:   08/30/21 1700 08/30/21 2245 08/31/21 2707 08/31/21 8675  BP: 107/64   119/79  Pulse: (!) 113   (!) 113  Resp: 20   18  Temp: 98.6 F (37 C) 98.2 F (36.8 C)  98.3 F (36.8 C)  TempSrc: Oral Oral  Oral  SpO2: 100%  97% 97%    Intake/Output Summary (Last 24 hours) at 08/31/2021  8144 Last data filed at 08/31/2021 0300 Gross per 24 hour  Intake 189.8 ml  Output --  Net 189.8 ml   There were no vitals filed for this visit.  Examination:  General exam: Chronic ill appearing.  Respiratory system: BL crackles.  Cardiovascular system: S1 & S2 heard,IRR, murmur Gastrointestinal system: Abdomen is nondistended, soft and nontender. No organomegaly or masses felt. Normal bowel sounds heard. Central nervous system: Alert and oriented. N Extremities: R BKA left LE foot with erythema, blister, purpura.     Data Reviewed: I have personally reviewed following labs and imaging studies  CBC: Recent Labs  Lab 08/26/21 1004 08/27/21 0515 08/27/21 1830 08/28/21 0441 08/29/21 0451 08/30/21 0652  WBC 7.5 6.1 6.8 5.4 6.6 8.4  NEUTROABS 5.7  --   --   --   --   --   HGB 7.2* 7.9* 7.5* 8.4* 8.0* 10.4*  HCT 23.1* 25.9* 24.0* 26.5* 26.0* 33.1*  MCV 99.1 97.0 98.8 95.3 96.7 97.6  PLT 276 332 368 317 294 818   Basic Metabolic Panel: Recent Labs  Lab 08/25/21 1639 08/26/21 1004 08/27/21 1830 08/29/21 1629 08/31/21 0145  NA 126* 131* 128* 133* 131*  K 4.2 3.6 4.1 3.7 3.9  CL 93* 95* 92* 98 94*  CO2 24 27 23 24 24   GLUCOSE 112* 134* 104* 144* 96  BUN 56* 31* 49* 38* 30*  CREATININE 7.80* 4.82* 6.45* 5.82* 5.01*  CALCIUM 6.4* 7.3* 7.2* 7.3* 7.6*  PHOS  --  2.7 3.5 3.2  --    GFR: Estimated Creatinine Clearance: 14.4 mL/min (A) (by C-G formula based on SCr of 5.01 mg/dL (H)). Liver Function Tests: Recent Labs  Lab 08/26/21 1004 08/27/21 1830 08/29/21 1629  ALBUMIN 1.8* 1.8* 1.9*   No results for input(s): LIPASE, AMYLASE in the last 168 hours. No results for input(s): AMMONIA in the last 168 hours. Coagulation Profile: No results for input(s): INR, PROTIME in the last 168 hours. Cardiac Enzymes: No results for input(s): CKTOTAL, CKMB, CKMBINDEX, TROPONINI in the last 168 hours. BNP (last 3 results) No results for input(s): PROBNP in the last 8760  hours. HbA1C: No results for input(s): HGBA1C in the last 72 hours. CBG: Recent Labs  Lab 08/26/21 0811  GLUCAP 162*   Lipid Profile: Recent Labs    08/29/21 0451  CHOL 78  HDL 32*  LDLCALC 32  TRIG 68  CHOLHDL 2.4   Thyroid Function Tests: No results for input(s): TSH, T4TOTAL, FREET4, T3FREE, THYROIDAB in the last 72 hours. Anemia Panel: No results for input(s): VITAMINB12, FOLATE, FERRITIN, TIBC, IRON, RETICCTPCT in the last 72 hours. Sepsis Labs: No results for input(s): PROCALCITON, LATICACIDVEN in the last 168 hours.  No results found for this or any previous visit (from the past 240 hour(s)).       Radiology Studies: No results found.      Scheduled Meds:  atorvastatin  10 mg Oral QHS   cinacalcet  60 mg Oral Q supper   [START ON 09/01/2021] Darbepoetin Alfa  60 mcg Intravenous Q Tue-HD   diltiazem  120 mg Oral Daily   docusate sodium  100 mg Oral TID  gabapentin  100 mg Oral BID   hydrocortisone  25 mg Rectal BID   metoprolol tartrate  25 mg Oral BID   midodrine  10 mg Oral TID WC   pantoprazole  40 mg Oral Daily   polyethylene glycol  17 g Oral Daily   senna  2 tablet Oral QHS   sertraline  100 mg Oral Daily   sevelamer carbonate  2.4 g Oral TID WC   Continuous Infusions:  sodium chloride     [START ON 09/01/2021] ferric gluconate (FERRLECIT) IVPB     heparin 1,950 Units/hr (08/31/21 0452)     LOS: 1 day    Time spent: 35 Minutes.     Elmarie Shiley, MD [facility]   If 7PM-7AM, please contact night-coverage [email or web address]  08/31/2021, 7:29 AM

## 2021-08-31 NOTE — Progress Notes (Signed)
ANTICOAGULATION CONSULT NOTE - Follow Up Consult  Pharmacy Consult for Apixaban >> Heparin Indication: atrial fibrillation  No Known Allergies  Patient Measurements:   Heparin Dosing Weight: 74.6 kg  Vital Signs: Temp: 98.3 F (36.8 C) (01/23 0711) Temp Source: Oral (01/23 0711) BP: 119/79 (01/23 0711) Pulse Rate: 113 (01/23 0711)  Labs: Recent Labs    08/29/21 0451 08/29/21 1400 08/29/21 1629 08/29/21 2132 08/30/21 0652 08/30/21 1006 08/31/21 0145  HGB 8.0*  --   --   --  10.4*  --   --   HCT 26.0*  --   --   --  33.1*  --   --   PLT 294  --   --   --  287  --   --   APTT 57*   < >  --    < > 56* 66* 96*  HEPARINUNFRC >1.10*  --   --   --  >1.10*  --  0.90*  CREATININE  --   --  5.82*  --   --   --  5.01*   < > = values in this interval not displayed.     Estimated Creatinine Clearance: 14.4 mL/min (A) (by C-G formula based on SCr of 5.01 mg/dL (H)).   Assessment: 66 yo M recently had right BKA for PAD. He has been on apixaban for his AF.   He is s/p left angiogram on 1/20 with no options for revascularization and will need a left BKA likely tomorrow 1/24.   He was transferred from Rehab to 4E for closer monitoring.  aPTT continues to be at goal this morning at 96s, heparin level not quite correlated at 0.9. No plans for rate adjustment.   Goal of Therapy:  Heparin level 0.3-0.7 units/ml aPTT 66-102 seconds Monitor platelets by anticoagulation protocol: Yes   Plan:  Continue heparin gtt at 1950 units/hr. Daily CBC, heparin level, aPTT.  Erin Hearing PharmD., BCPS Clinical Pharmacist 08/31/2021 10:45 AM

## 2021-08-31 NOTE — Progress Notes (Signed)
[facility] [place] PROGRESS NOTE  ASSESSMENT / PLAN: Scott Castro is a 66 y.o. male with chronic limb threatening ischemia of left lower extremity. Plan left below knee amputation 09/02/21. Reviewed this with the patient and he is understanding.   SUBJECTIVE: No complaints. Pain well controlled. Understanding of plan moving forward.  OBJECTIVE: BP 110/80 (BP Location: Right Arm)    Pulse (!) 112    Temp 98.3 F (36.8 C) (Oral)    Resp 19    SpO2 95%   Intake/Output Summary (Last 24 hours) at 08/31/2021 1510 Last data filed at 08/31/2021 0857 Gross per 24 hour  Intake 669.8 ml  Output 0 ml  Net 669.8 ml    No distress LLE dressing clean and dry. Toes ischemic.   CBC Latest Ref Rng & Units 08/30/2021 08/29/2021 08/28/2021  WBC 4.0 - 10.5 K/uL 8.4 6.6 5.4  Hemoglobin 13.0 - 17.0 g/dL 10.4(L) 8.0(L) 8.4(L)  Hematocrit 39.0 - 52.0 % 33.1(L) 26.0(L) 26.5(L)  Platelets 150 - 400 K/uL 287 294 317     CMP Latest Ref Rng & Units 08/31/2021 08/29/2021 08/27/2021  Glucose 70 - 99 mg/dL 96 144(H) 104(H)  BUN 8 - 23 mg/dL 30(H) 38(H) 49(H)  Creatinine 0.61 - 1.24 mg/dL 5.01(H) 5.82(H) 6.45(H)  Sodium 135 - 145 mmol/L 131(L) 133(L) 128(L)  Potassium 3.5 - 5.1 mmol/L 3.9 3.7 4.1  Chloride 98 - 111 mmol/L 94(L) 98 92(L)  CO2 22 - 32 mmol/L 24 24 23   Calcium 8.9 - 10.3 mg/dL 7.6(L) 7.3(L) 7.2(L)  Total Protein 6.5 - 8.1 g/dL - - -  Total Bilirubin 0.3 - 1.2 mg/dL - - -  Alkaline Phos 38 - 126 U/L - - -  AST 15 - 41 U/L - - -  ALT 0 - 44 U/L - - -    Estimated Creatinine Clearance: 14.4 mL/min (A) (by C-G formula based on SCr of 5.01 mg/dL (H)).  Scott Castro. [name], MD [facility] Phone Number: [phone] 08/31/2021 3:10 PM

## 2021-08-31 NOTE — Progress Notes (Signed)
OT Cancellation Note  Patient Details Name: [name] MRN: [id number] DOB: Jun 20, 1956   Cancelled Treatment:    Reason Eval/Treat Not Completed: Other (comment) Initiated OT eval this AM and noted bleeding through R BKA bandage. RN aware, will follow-up for OT eval completion after dressing change.   Layla Maw 08/31/2021, 8:08 AM

## 2021-08-31 NOTE — Progress Notes (Signed)
Inpatient Rehab Admissions Coordinator:    Pt. From [facility]. Await therapy recommendations to determine if Pt. Is appropriate to return.  Clemens Catholic, [place], [place] Admissions Coordinator  [phone] ([place]) [phone] (office)

## 2021-08-31 NOTE — Progress Notes (Signed)
Echocardiogram 2D Echocardiogram has been performed.  Arlyss Gandy 08/31/2021, 2:41 PM

## 2021-08-31 NOTE — Evaluation (Signed)
Occupational Therapy Evaluation Patient Details Name: Scott Castro MRN: [id number] DOB: 25-Jun-1956 Today's Date: 08/31/2021   History of Present Illness 66 yo male presenting on 1/20 from [facility] with BL diffuse infrapopliteal disease of the left lower extremity, vascular is recommending left BKA. Recent hospitalizeion at [facility] (08/05/21-08/12/21) for R BKA on 12/30 with dc to [facility]. PMH including  ESRD on dialysis TTS, hypertension, hyperlipidemia, PAF, depression, chronic respiratory failure 2 L of oxygen.   Clinical Impression   PTA, pt was at [facility] after R BKA and was working with therapy team on ADLs and functional transfers; reaching [place] A level before admission to [facility]. Pt currently requiring Mod A for LB ADLs and Min A for lateral scoot to recliner with sliding board. Pt planning for L BKA this week and will need re-assessment. Pt would benefit from further acute OT to facilitate safe dc. Pending functional status after L BKA, recommend dc to [facility] for further OT to optimize safety, independence with ADLs, and return to PLOF.      Recommendations for follow up therapy are one component of a multi-disciplinary discharge planning process, led by the attending physician.  Recommendations may be updated based on patient status, additional functional criteria and insurance authorization.   Follow Up Recommendations  Acute inpatient rehab (3hours/day) (Pending LLE amputation)    Assistance Recommended at Discharge Frequent or constant Supervision/Assistance  Patient can return home with the following A little help with walking and/or transfers;A little help with bathing/dressing/bathroom;Assist for transportation;Help with stairs or ramp for entrance;Assistance with cooking/housework    Functional Status Assessment  Patient has had a recent decline in their functional status and demonstrates the ability to make significant improvements in function in a reasonable and predictable amount of time.  Equipment  Recommendations  BSC/3in1;Wheelchair (measurements OT);Wheelchair cushion (measurements OT);Other (comment);Tub/shower bench (Sliding board)    Recommendations for Other Services       Precautions / Restrictions Precautions Precautions: Fall Restrictions RLE Weight Bearing: Non weight bearing LLE Weight Bearing: Weight bearing as tolerated Other Position/Activity Restrictions: s/p R BKA      Mobility Bed Mobility Overal bed mobility: Needs Assistance Bed Mobility: Supine to Sit     Supine to sit: Supervision, +2 for safety/equipment     General bed mobility comments: Supervision    Transfers Overall transfer level: Needs assistance Equipment used: Sliding board Transfers: Bed to chair/wheelchair/BSC            Lateral/Scoot Transfers: Min guard, +2 safety/equipment, With slide board        Balance Overall balance assessment: Needs assistance Sitting-balance support: Feet supported, No upper extremity supported Sitting balance-Leahy Scale: Fair                                     ADL either performed or assessed with clinical judgement   ADL Overall ADL's : Needs assistance/impaired Eating/Feeding: Set up;Sitting   Grooming: Set up;Sitting   Upper Body Bathing: Set up;Supervision/ safety;Sitting   Lower Body Bathing: Moderate assistance;Sitting/lateral leans   Upper Body Dressing : Supervision/safety;Set up;Sitting   Lower Body Dressing: Moderate assistance;Sitting/lateral leans   Toilet Transfer: Minimal assistance;+2 for safety/equipment;Transfer board (simluated to recliner)           Functional mobility during ADLs: Minimal assistance;+2 for safety/equipment (lateral scoot)       Vision Baseline Vision/History: 1 Wears glasses       Perception  Praxis      Pertinent Vitals/Pain Pain Assessment Pain Assessment: 0-10 Faces Pain Scale: Hurts little more Pain Location: L foot Pain Descriptors / Indicators:  Discomfort, Grimacing Pain Intervention(s): Monitored during session, Limited activity within patient's tolerance, Repositioned     Hand Dominance     Extremity/Trunk Assessment Upper Extremity Assessment Upper Extremity Assessment: Generalized weakness   Lower Extremity Assessment Lower Extremity Assessment: Defer to PT evaluation;RLE deficits/detail;LLE deficits/detail RLE Deficits / Details: S/p R BKA LLE Deficits / Details: Planning for L amputation   Cervical / Trunk Assessment Cervical / Trunk Assessment: Normal   Communication Communication Communication: No difficulties   Cognition Arousal/Alertness: Awake/alert Behavior During Therapy: WFL for tasks assessed/performed Overall Cognitive Status: Within Functional Limits for tasks assessed                                       General Comments       Exercises     Shoulder Instructions      Home Living Family/patient expects to be discharged to:: Private residence Living Arrangements: Alone Available Help at Discharge: Friend(s);Neighbor;Available PRN/intermittently Type of Home: House Home Access: Ramped entrance     Home Layout: One level     Bathroom Shower/Tub: Teacher, early years/pre: Standard                Prior Functioning/Environment Prior Level of Function : Needs assist             Mobility Comments: Was practicing transfers with RW and therapy staff. ADLs Comments: Was working on ADLs with therapists and staff. Reaching Min A level        OT Problem List: Decreased strength;Decreased coordination;Cardiopulmonary status limiting activity;Pain;Decreased range of motion;Decreased activity tolerance;Decreased safety awareness;Impaired balance (sitting and/or standing);Decreased knowledge of use of DME or AE      OT Treatment/Interventions: Self-care/ADL training;Therapeutic exercise;Therapeutic activities;DME and/or AE instruction;Balance  training;Patient/family education    OT Goals(Current goals can be found in the care plan section) Acute Rehab OT Goals Patient Stated Goal: Get stronger and go home OT Goal Formulation: With patient Time For Goal Achievement: 09/14/21 Potential to Achieve Goals: Good  OT Frequency: Min 3X/week    Co-evaluation PT/OT/SLP Co-Evaluation/Treatment: Yes Reason for Co-Treatment: To address functional/ADL transfers   OT goals addressed during session: ADL's and self-care      AM-PAC OT "6 Clicks" Daily Activity     Outcome Measure Help from another person eating meals?: A Little Help from another person taking care of personal grooming?: A Little Help from another person toileting, which includes using toliet, bedpan, or urinal?: A Little Help from another person bathing (including washing, rinsing, drying)?: A Little Help from another person to put on and taking off regular upper body clothing?: A Little Help from another person to put on and taking off regular lower body clothing?: A Little 6 Click Score: 18   End of Session Equipment Utilized During Treatment: Other (comment) (sliding board) Nurse Communication: Mobility status  Activity Tolerance: Patient tolerated treatment well Patient left: in chair;with call bell/phone within reach;with nursing/sitter in room  OT Visit Diagnosis: Other abnormalities of gait and mobility (R26.89);Muscle weakness (generalized) (M62.81);Pain Pain - Right/Left: Right Pain - part of body: Knee;Leg;Hip                Time: 5284-1324 OT Time Calculation (min): 25 min Charges:  OT General Charges $  OT Visit: 1 Visit OT Evaluation $OT Eval Moderate Complexity: 1 Mod  Michah Minton MSOT, OTR/L Acute Rehab Pager: [phone] Office: [place] 08/31/2021, 4:07 PM

## 2021-08-31 NOTE — Progress Notes (Signed)
OT Cancellation Note  Patient Details Name: ERYCK NEGRON MRN: [id number] DOB: 27-Apr-1956   Cancelled Treatment:    Reason Eval/Treat Not Completed: Other (comment) Checked back with pt and RN. Pt's R BKA incision open to air with bloody drainage. Pt reports they are waiting for MD or PA assessment prior to rewrapping. Will hold OT eval until R BKA incision rewrapped to protect incision site.   Layla Maw 08/31/2021, 10:57 AM

## 2021-08-31 NOTE — Progress Notes (Addendum)
The patient has been seen in conjunction with Vin Bhagat, PAC. All aspects of care have been considered and discussed. The patient has been personally interviewed, examined, and all clinical data has been reviewed.  No cardiac complaints. Reviewed the most recent EKGs.  Currently in sinus tachycardia with first-degree AV block.  Some earlier tracings suggest atrial flutter Continue current therapy. Echo is pending. Continue anticoagulation   Progress Note  Patient Name: Scott Castro Date of Encounter: 08/31/2021  [facility] Cardiologist: None Dr Aris [facility] clinic)  Subjective   Feeling well. No chest pain, sob or palpitations.    Inpatient Medications    Scheduled Meds:  atorvastatin  10 mg Oral QHS   cinacalcet  60 mg Oral Q supper   [START ON 09/01/2021] Darbepoetin Alfa  60 mcg Intravenous Q Tue-HD   diltiazem  120 mg Oral Daily   docusate sodium  100 mg Oral TID   gabapentin  100 mg Oral BID   hydrocortisone  25 mg Rectal BID   metoprolol tartrate  25 mg Oral BID   midodrine  10 mg Oral TID WC   pantoprazole  40 mg Oral Daily   polyethylene glycol  17 g Oral Daily   senna  2 tablet Oral QHS   sertraline  100 mg Oral Daily   sevelamer carbonate  2.4 g Oral TID WC   Continuous Infusions:  sodium chloride     [START ON 09/01/2021] ferric gluconate (FERRLECIT) IVPB     heparin 1,950 Units/hr (08/31/21 0452)   PRN Meds: sodium chloride, acetaminophen, albuterol, bisacodyl, calcium carbonate, diphenhydrAMINE, guaiFENesin-dextromethorphan, milk and molasses, oxyCODONE, prochlorperazine **OR** prochlorperazine **OR** prochlorperazine, sevelamer carbonate, witch hazel-glycerin   Vital Signs    Vitals:   08/30/21 2245 08/31/21 0643 08/31/21 0711 08/31/21 0800  BP:   119/79   Pulse:   (!) 113   Resp:   18   Temp: 98.2 F (36.8 C)  98.3 F (36.8 C)   TempSrc: Oral  Oral   SpO2:  97% 97% 97%    Intake/Output Summary (Last 24 hours) at 08/31/2021  1039 Last data filed at 08/31/2021 0857 Gross per 24 hour  Intake 669.8 ml  Output 0 ml  Net 669.8 ml   Last 3 Weights 08/30/2021 08/29/2021 08/29/2021  Weight (lbs) 154 lb 15.7 oz 154 lb 15.7 oz 158 lb 4.6 oz  Weight (kg) 70.3 kg 70.3 kg 71.8 kg      Telemetry    Sinus tachy 110s - Personally Reviewed  ECG    N/A  Physical Exam   GEN: No acute distress.   Neck: No JVD Cardiac: Regular tachycardia, + murmurs, rubs, or gallops.  Respiratory: Clear to auscultation bilaterally. GI: Soft, nontender, non-distended  MS: LLE erythema, S/p RBKA Neuro:  Nonfocal  Psych: Normal affect   Labs    High Sensitivity Troponin:  No results for input(s): TROPONINIHS in the last 720 hours.   Chemistry Recent Labs  Lab 08/26/21 1004 08/27/21 1830 08/29/21 1629 08/31/21 0145  NA 131* 128* 133* 131*  K 3.6 4.1 3.7 3.9  CL 95* 92* 98 94*  CO2 27 23 24 24   GLUCOSE 134* 104* 144* 96  BUN 31* 49* 38* 30*  CREATININE 4.82* 6.45* 5.82* 5.01*  CALCIUM 7.3* 7.2* 7.3* 7.6*  ALBUMIN 1.8* 1.8* 1.9*  --   GFRNONAA 13* 9* 10* 12*  ANIONGAP 9 13 11 13     Lipids  Recent Labs  Lab 08/29/21 0451  CHOL 78  TRIG 68  HDL 32*  LDLCALC 32  CHOLHDL 2.4    Hematology Recent Labs  Lab 08/28/21 0441 08/29/21 0451 08/30/21 0652  WBC 5.4 6.6 8.4  RBC 2.78* 2.69* 3.39*  HGB 8.4* 8.0* 10.4*  HCT 26.5* 26.0* 33.1*  MCV 95.3 96.7 97.6  MCH 30.2 29.7 30.7  MCHC 31.7 30.8 31.4  RDW 17.0* 17.2* 17.2*  PLT 317 294 287   Thyroid No results for input(s): TSH, FREET4 in the last 168 hours.  BNPNo results for input(s): BNP, PROBNP in the last 168 hours.  DDimer No results for input(s): DDIMER in the last 168 hours.   Radiology    DG CHEST PORT 1 VIEW  Result Date: 08/31/2021 CLINICAL DATA:  Hypoxemia. EXAM: PORTABLE CHEST 1 VIEW COMPARISON:  07/03/2021 and CT chest 07/29/2021. FINDINGS: Trachea is midline. Heart is enlarged. Loculated pleural effusions bilaterally with scattered airspace  opacities, right greater than left. IMPRESSION: Moderate loculated bilateral pleural effusions with scattered associated volume loss. Electronically Signed   By: Lorin Picket M.D.   On: 08/31/2021 10:26    Cardiac Studies   Pending echo   Patient Profile     66 y.o. male with hx of COPD, hypertension, hyperlipidemia, PAD with critical limb ischemia, atrial fibrillation and end-stage renal disease   Assessment & Plan    Atrial fibrillation - Rhythm looks like sinus tachy but no clear P wave. Will get EKG - HR in 100s. Continue Cardizem 120mg  qd and metoprolol 25mg  BID. Titrate as BP tolerated.   2. Murmur - Reported hx of rheumatic fever as child - Will update echo --Continue IV heparin, can switch back to Eliquis after surgery  3. Critical limb ischemia- status post right BKA on 08/07/2021.  Angiography 1/20 by Dr. [name] revealed diffuse infrapopliteal disease on the left with no revascularization options. Vascular surgery planning left BKA this week  4. ESRD on HD (MWF)    For questions or updates, please contact [place] Please consult [email or web address] for contact info under        SignedLeanor Kail, PA  08/31/2021, 10:39 AM

## 2021-08-31 NOTE — Progress Notes (Signed)
[facility] Progress Note   66 y.o. male PMH COPD Barrett's, hyperparathyroidism, ESRD, failed renal transplant, PAF on Eliquis p/w ischemic foot ulcer -> BKA on 12/30.  [name]   Dialysis at [facility] with [facility] EDW 68.4kg? EDW before the amputation was 72kg     Assessment/ Plan:   ESRD - TTS at [facility] w/ [facility] - establishing new EDW  - HD Monday - set up for MWF outpt now, though this may further change, see below.  We'll go ahead and do today as it looks like his amputation may be Tues   -  Uses elisio dialyzer at [place] but has been tolerating our standard optiflux dialyzer here  [name]- last Phos 1/1 was 5; 1/16 phos 3.4 [name] 2.4 grams packet TIDM and 1600 with snacks-  will decrease given phos of 3 . [name] 60mg  qdaily. Anemia - on ESA and s/p iron load.  Required transfusion.  ^ ESA dose. Says hgb 10 today ?  PAD with critical limb ischemia s/p rt BKA for gangrene - now will require LBKA s/p angiogram 1/20 showed no intervention possible so plans for amputation while admitted.   PAF - rate controlled and  Eliquis typically. On dilt and metop - cont even prior to dialysis.  Was having some RVR periodically during this admission and cardiology has been consulted - titrating BB and watching BP.  He's on midodrine now as well.  GERD COPD   9. Dispo: private transport to [place] HD unit from his [place] home too costly, will switch from [name] to [facility]- MWF-  has spot.   Now that he's having a 2nd amputation he will likely go to SNF and plans may need to change further depending on location of SNF.   Subjective:    Continued rest pain of LLE that improves by dangling leg.  Has decided to have amputation while admitted-  not sure of timing-  due for HD today   BP 119/79 (BP Location: Right Arm)    Pulse (!) 113    Temp 98.3 F (36.8 C) (Oral)    Resp 18    SpO2  97%   Intake/Output Summary (Last 24 hours) at 08/31/2021 1042 Last data filed at 08/31/2021 0857 Gross per 24 hour  Intake 669.8 ml  Output 0 ml  Net 669.8 ml   Weight change:   Physical Exam: GEN: NAD, A&Ox3, NCAT HEENT: MMM LUNGS: CTA B/L no rales, rhonchi or wheezing CV: irregular irregular HR low 100s, crescendo murmur ABD: SNDNT +BS  EXT:R BKA, L foot with purpura, blistering, partially wrapped ACCESS: Lt BBT--> distal portion is aneurysmal +t/b  Imaging: DG CHEST PORT 1 VIEW  Result Date: 08/31/2021 CLINICAL DATA:  Hypoxemia. EXAM: PORTABLE CHEST 1 VIEW COMPARISON:  07/03/2021 and CT chest 07/29/2021. FINDINGS: Trachea is midline. Heart is enlarged. Loculated pleural effusions bilaterally with scattered airspace opacities, right greater than left. IMPRESSION: Moderate loculated bilateral pleural effusions with scattered associated volume loss. Electronically Signed   By: Lorin Picket M.D.   On: 08/31/2021 10:26    Labs: BMET Recent Labs  Lab 08/25/21 1639 08/26/21 1004 08/27/21 1830 08/29/21 1629 08/31/21 0145  NA 126* 131* 128* 133* 131*  K 4.2 3.6 4.1 3.7 3.9  CL 93* 95* 92* 98 94*  CO2 24 27 23 24 24   GLUCOSE 112* 134* 104* 144* 96  BUN 56* 31* 49* 38* 30*  CREATININE 7.80* 4.82* 6.45* 5.82* 5.01*  CALCIUM 6.4* 7.3* 7.2* 7.3* 7.6*  PHOS  --  2.7 3.5 3.2  --    CBC Recent Labs  Lab 08/26/21 1004 08/27/21 0515 08/27/21 1830 08/28/21 0441 08/29/21 0451 08/30/21 0652  WBC 7.5   < > 6.8 5.4 6.6 8.4  NEUTROABS 5.7  --   --   --   --   --   HGB 7.2*   < > 7.5* 8.4* 8.0* 10.4*  HCT 23.1*   < > 24.0* 26.5* 26.0* 33.1*  MCV 99.1   < > 98.8 95.3 96.7 97.6  PLT 276   < > 368 317 294 287   < > = values in this interval not displayed.    Medications:     atorvastatin  10 mg Oral QHS   cinacalcet  60 mg Oral Q supper   [START ON 09/01/2021] Darbepoetin Alfa  60 mcg Intravenous Q Tue-HD   diltiazem  120 mg Oral Daily   docusate sodium  100 mg Oral TID    gabapentin  100 mg Oral BID   hydrocortisone  25 mg Rectal BID   metoprolol tartrate  25 mg Oral BID   midodrine  10 mg Oral TID WC   pantoprazole  40 mg Oral Daily   polyethylene glycol  17 g Oral Daily   senna  2 tablet Oral QHS   sertraline  100 mg Oral Daily   sevelamer carbonate  2.4 g Oral TID WC    Scott Castro [facility] Pager [phone]

## 2021-08-31 NOTE — Progress Notes (Signed)
PT Cancellation Note  Patient Details Name: Scott Castro MRN: [id number] DOB: 06/04/1956   Cancelled Treatment:    Reason Eval/Treat Not Completed: Medical issues which prohibited therapy at this time. The pt's BKA incision is open and actively draining this morning. Per RN, awaiting MD or PA assessment prior to re-wrapping, PT evaluation will hold until re-wrapped to protect incision site.   West Carbo, PT, DPT   Acute Rehabilitation Department Pager #: [phone]   Sandra Cockayne 08/31/2021, 11:09 AM

## 2021-08-31 NOTE — Consult Note (Signed)
Palliative Medicine Inpatient Consult Note  Consulting Provider: Cherre Robins, MD  Reason for consult:   [facility] Palliative Medicine Consult  Reason for Consult? difficult decisions; patient needs left below knee amputation. He is having trouble adjusting, and wants help with decision making. he has no family.   HPI:  Per intake H&P --> Scott Castro is a 66 y.o. male with a history of ESRD on dialysis TThS, COPD on 2-3LNC, atrial fibrillation. Admitted for right lower extremity pain has had a right lower extremity BKA.  At this time patient has impaired vascular return to his left lower extremity for which he will get a left BKA.  Patient seen on 1/21 in CIR for initial - we Have been re-consulted as [name] has transitioned to acute inpatient for ongoing support.  Clinical Assessment/Goals of Care:  *Please note that this is a verbal dictation therefore any spelling or grammatical errors are due to the "[place] [employer]" system interpretation.  I have reviewed medical records including EPIC notes, labs and imaging, received report from bedside RN, assessed the patient who is sitting in bed in no acute distress.    I met with Scott Castro to further discuss diagnosis prognosis, GOC, EOL wishes, disposition and options.   I introduced Palliative Medicine as specialized medical care for people living with serious illness. It focuses on providing relief from the symptoms and stress of a serious illness. The goal is to improve quality of life for both the patient and the family.  Discussion (08/31/2021):  I met with Scott Castro at bedside. He is in better spirits today. He shares that he realizes he will be a "double amputee" and is coming to grips with this reality. He shares that he has been struggling mentally in many ways and has the worry regarding how all of these interventions will be paid for. He has a plan in place in regards to his FMLA and disability insurance.    We discussed that his journey to recovery will be challenging though I highlighted the fact that he appears to have a strong will. He shares the plan for the day from his understanding is to receive dialysis and get an echocardiogram. He reviews that he already had an EKG done.  Discussed the importance of continued efforts with physical therapy. [name] shares that they came by this morning and plan on coming back tomorrow as well.   Decision Maker: [name] can make decisions for himself though if he were ever incapacitated for any reason he would rely on his loved one Earley Abide to be his primary Media planner.  SUMMARY OF RECOMMENDATIONS   Full Code/ Full Scope of Care  Plan for L BKA  Goals are to be able to in the future be fitted for prosthetics and regain functionality  Will obtain a copy of patient's advanced directives from [facility] in [place] --> Haved called [facility] in [place] and left messages as of present no one has returned the call regarding this  Appreciate chaplain support given existential distress patient is experiencing  Appreciate transitions of care team helping patient with some of his navigational questions about what insurance will and will not cover  Ongoing palliative care support  Code Status/Advance Care Planning: FULL CODE  Palliative Prophylaxis:  Aspiration, Bowel Regimen, Delirium Protocol, Frequent Pain Assessment, Oral Care, Palliative Wound Care, and Turn Reposition  Additional Recommendations (Limitations, Scope, Preferences):  Full Scope Treatment  Psycho-social/Spiritual:  Desire for further Chaplaincy support: Yes Additional  Recommendations: Education on peripheral vascular disease   Prognosis: Unclear  Discharge Planning: Discharge will likely be to a long-term care facility  Vitals:   08/31/21 1056 08/31/21 1200  BP: 112/81 110/80  Pulse: (!) 114 (!) 112  Resp: 20 19  Temp: 98.2 F (36.8 C) 98.3 F (36.8 C)  SpO2: 95% 95%     Intake/Output Summary (Last 24 hours) at 08/31/2021 1406 Last data filed at 08/31/2021 0857 Gross per 24 hour  Intake 669.8 ml  Output 0 ml  Net 669.8 ml   Gen: Middle-age Caucasian male in no acute distress HEENT: moist mucous membranes CV: Irregular rate and rhythm PULM: On 2 L nasal cannula breathing is even and nonlabored ABD: soft/nontender EXT: Right BKA  Neuro: Alert and oriented x3  PPS: 40-50%   This conversation/these recommendations were discussed with patient primary care team, [place]  MDM High  Medical Decision Making: 4 #/Complex Problems: 4                     Data Reviewed: 4                Management: 4 (1-Straightforward, 2-Low, 3-Moderate, 4-High) ______________________________________________________ [place] Palliative Medicine Team Team Cell Phone: [phone] Please utilize secure chat with additional questions, if there is no response within 30 minutes please call the above phone number  Palliative Medicine Team providers are available by phone from 7am to 7pm daily and can be reached through the team cell phone.  Should this patient require assistance outside of these hours, please call the patient's attending physician.

## 2021-08-31 NOTE — Progress Notes (Signed)
°  Transition of Care [facility]) Screening Note   Patient Details  Name: Scott Castro Date of Birth: 12/21/1955   Transition of [facility]) CM/SW Contact:    Dawayne Patricia, RN Phone Number: 08/31/2021, 2:28 PM    Transition of Care Department [facility]) has reviewed patient and note pt readmitted from [facility] CIR. New Left LE ischemia and plan for Left BKA sometime this week. CIR following for possible readmit to rehab once medically stable. We will continue to monitor patient advancement through interdisciplinary progression rounds. If new patient transition needs arise, please place a TOC consult.

## 2021-09-01 DIAGNOSIS — R9389 Abnormal findings on diagnostic imaging of other specified body structures: Secondary | ICD-10-CM

## 2021-09-01 DIAGNOSIS — J449 Chronic obstructive pulmonary disease, unspecified: Secondary | ICD-10-CM

## 2021-09-01 DIAGNOSIS — J9611 Chronic respiratory failure with hypoxia: Secondary | ICD-10-CM

## 2021-09-01 LAB — CBC
HCT: 26.4 % — ABNORMAL LOW (ref 39.0–52.0)
Hemoglobin: 8.3 g/dL — ABNORMAL LOW (ref 13.0–17.0)
MCH: 30.2 pg (ref 26.0–34.0)
MCHC: 31.4 g/dL (ref 30.0–36.0)
MCV: 96 fL (ref 80.0–100.0)
Platelets: 238 10*3/uL (ref 150–400)
RBC: 2.75 MIL/uL — ABNORMAL LOW (ref 4.22–5.81)
RDW: 17.2 % — ABNORMAL HIGH (ref 11.5–15.5)
WBC: 7.2 10*3/uL (ref 4.0–10.5)
nRBC: 0 % (ref 0.0–0.2)

## 2021-09-01 LAB — BASIC METABOLIC PANEL
Anion gap: 10 (ref 5–15)
BUN: 39 mg/dL — ABNORMAL HIGH (ref 8–23)
CO2: 24 mmol/L (ref 22–32)
Calcium: 7.7 mg/dL — ABNORMAL LOW (ref 8.9–10.3)
Chloride: 95 mmol/L — ABNORMAL LOW (ref 98–111)
Creatinine, Ser: 6.37 mg/dL — ABNORMAL HIGH (ref 0.61–1.24)
GFR, Estimated: 9 mL/min — ABNORMAL LOW (ref >60)
Glucose, Bld: 88 mg/dL (ref 70–99)
Potassium: 3.9 mmol/L (ref 3.5–5.1)
Sodium: 129 mmol/L — ABNORMAL LOW (ref 135–145)

## 2021-09-01 LAB — HEPARIN LEVEL (UNFRACTIONATED): Heparin Unfractionated: 0.6 IU/mL (ref 0.30–0.70)

## 2021-09-01 LAB — APTT: aPTT: 73 seconds — ABNORMAL HIGH (ref 24–36)

## 2021-09-01 MED ORDER — METOPROLOL TARTRATE 25 MG PO TABS
37.5000 mg | ORAL_TABLET | Freq: Two times a day (BID) | ORAL | Status: DC
  Administered 2021-09-02 – 2021-09-15 (×23): 37.5 mg via ORAL
  Filled 2021-09-01 (×26): qty 1

## 2021-09-01 MED ORDER — DARBEPOETIN ALFA 60 MCG/0.3ML IJ SOSY
100.0000 ug | PREFILLED_SYRINGE | INTRAMUSCULAR | Status: DC
  Administered 2021-09-01: 100 ug via INTRAVENOUS

## 2021-09-01 MED ORDER — SODIUM CHLORIDE 0.9 % IV SOLN
1.5000 g | INTRAVENOUS | Status: AC
  Administered 2021-09-02: 11:00:00 1.5 g via INTRAVENOUS
  Filled 2021-09-01: qty 1.5

## 2021-09-01 MED ORDER — DARBEPOETIN ALFA 100 MCG/0.5ML IJ SOSY
PREFILLED_SYRINGE | INTRAMUSCULAR | Status: AC
  Filled 2021-09-01: qty 0.5

## 2021-09-01 NOTE — Progress Notes (Signed)
Progress Note  Patient Name: Scott Castro Date of Encounter: 09/01/2021  [facility] Cardiologist: None   Subjective   No cardiac complaints  Inpatient Medications    Scheduled Meds:  atorvastatin  10 mg Oral QHS   cinacalcet  60 mg Oral Q supper   Darbepoetin Alfa  100 mcg Intravenous Q Tue-HD   diltiazem  120 mg Oral Daily   docusate sodium  100 mg Oral TID   gabapentin  100 mg Oral BID   hydrocortisone  25 mg Rectal BID   metoprolol tartrate  25 mg Oral BID   midodrine  10 mg Oral TID WC   pantoprazole  40 mg Oral Daily   polyethylene glycol  17 g Oral Daily   senna  2 tablet Oral QHS   sertraline  100 mg Oral Daily   sevelamer carbonate  2.4 g Oral TID WC   Continuous Infusions:  sodium chloride     [START ON 09/02/2021] cefUROXime (ZINACEF)  IV     ferric gluconate (FERRLECIT) IVPB Stopped (09/01/21 1126)   heparin 1,950 Units/hr (09/01/21 0510)   PRN Meds: sodium chloride, acetaminophen, albuterol, bisacodyl, calcium carbonate, diphenhydrAMINE, guaiFENesin-dextromethorphan, milk and molasses, oxyCODONE, prochlorperazine **OR** [DISCONTINUED] prochlorperazine **OR** [DISCONTINUED] prochlorperazine, sevelamer carbonate, witch hazel-glycerin   Vital Signs    Vitals:   09/01/21 1130 09/01/21 1133 09/01/21 1220 09/01/21 1221  BP: (!) 132/42 122/65 93/64 93/64   Pulse: (!) 108 (!) 105 100 100  Resp:  16 16 18   Temp:  (!) 97.5 F (36.4 C) 97.8 F (36.6 C)   TempSrc:  Temporal Oral   SpO2:  100% 100% 100%  Weight:  69.5 kg    Height:        Intake/Output Summary (Last 24 hours) at 09/01/2021 1521 Last data filed at 09/01/2021 1500 Gross per 24 hour  Intake 1052.01 ml  Output 1500 ml  Net -447.99 ml   Last 3 Weights 09/01/2021 09/01/2021 09/01/2021  Weight (lbs) 153 lb 3.5 oz 156 lb 8.4 oz 156 lb 8.4 oz  Weight (kg) 69.5 kg 71 kg 71 kg      Telemetry    Fixed heart rate without variability suggesting atrial flutter or atrial tachycardia with block.-  Personally Reviewed  ECG    Being repeated today.- Personally Reviewed  Physical Exam  Vital signs are stable Not examined  Labs    High Sensitivity Troponin:  No results for input(s): TROPONINIHS in the last 720 hours.   Chemistry Recent Labs  Lab 08/26/21 1004 08/27/21 1830 08/29/21 1629 08/31/21 0145 09/01/21 0159  NA 131* 128* 133* 131* 129*  K 3.6 4.1 3.7 3.9 3.9  CL 95* 92* 98 94* 95*  CO2 27 23 24 24 24   GLUCOSE 134* 104* 144* 96 88  BUN 31* 49* 38* 30* 39*  CREATININE 4.82* 6.45* 5.82* 5.01* 6.37*  CALCIUM 7.3* 7.2* 7.3* 7.6* 7.7*  ALBUMIN 1.8* 1.8* 1.9*  --   --   GFRNONAA 13* 9* 10* 12* 9*  ANIONGAP 9 13 11 13 10     Lipids  Recent Labs  Lab 08/29/21 0451  CHOL 78  TRIG 68  HDL 32*  LDLCALC 32  CHOLHDL 2.4    Hematology Recent Labs  Lab 08/29/21 0451 08/30/21 0652 09/01/21 0159  WBC 6.6 8.4 7.2  RBC 2.69* 3.39* 2.75*  HGB 8.0* 10.4* 8.3*  HCT 26.0* 33.1* 26.4*  MCV 96.7 97.6 96.0  MCH 29.7 30.7 30.2  MCHC 30.8 31.4 31.4  RDW 17.2*  17.2* 17.2*  PLT 294 287 238   Thyroid No results for input(s): TSH, FREET4 in the last 168 hours.  BNPNo results for input(s): BNP, PROBNP in the last 168 hours.  DDimer No results for input(s): DDIMER in the last 168 hours.   Radiology    CT CHEST WO CONTRAST  Result Date: 08/31/2021 CLINICAL DATA:  Loculated LEFT pleural effusion EXAM: CT CHEST WITHOUT CONTRAST TECHNIQUE: Multidetector CT imaging of the chest was performed following the standard protocol without IV contrast. RADIATION DOSE REDUCTION: This exam was performed according to the departmental dose-optimization program which includes automated exposure control, adjustment of the mA and/or kV according to patient size and/or use of iterative reconstruction technique. COMPARISON:  July 29, 2021 and high-resolution chest CT from April of 2020. FINDINGS: Cardiovascular: Calcified atheromatous plaque of the thoracic aorta without aneurysmal dilation.  Moderate cardiomegaly without substantial change. Small amount of pericardial fluid is diminished compared to previous imaging. Three-vessel coronary artery disease, extensive coronary artery calcifications as before. Mitral annular calcifications. Central pulmonary vasculature 3.3 cm similar to previous imaging. Mediastinum/Nodes: 18 mm RIGHT paratracheal lymph node, previously 16 mm (image 70/4) Small pre-vascular lymph nodes less than a cm are stable, enlarged compared to more remote studies. Mildly enlarged paratracheal lymph nodes also with slight enlargement since recent imaging (image 60/4) 15 mm short axis, previously 12 mm. Similar slight enlargement of other lymph nodes throughout the RIGHT paratracheal chain. Mild subcarinal nodal enlargement approximately 12 mm short axis is similar to prior imaging. No hilar adenopathy to the extent evaluated on noncontrast imaging. The esophagus is normal on limited assessment. No axillary adenopathy. Lungs/Pleura: Lenticular loculated RIGHT-sided pleural effusion along the lateral RIGHT chest (image 56/4) minimal fluid in this location before now measuring 7.2 x 2.9 cm greatest axial dimension, contiguous with loculated pleural fluid in the inferior RIGHT chest. Suspect mild underlying nodularity. Pleural fluid is circumferential in the RIGHT chest and increased in the medial RIGHT upper chest (image 36/4) 15 mm short axis, thickness of pleural fluid at the RIGHT lung apex. Other areas of loculated pleural fluid in the inferior RIGHT chest with similar appearance. Suspected rounded atelectasis amidst chronic pleural fluid in the RIGHT lower chest is similar. Loculated pleural fluid in the major fissure in the LEFT chest is diminished in size (image 77/4) 5.8 x 5.1 cm as compared to 10.0 x 5.2 cm. Chronic appearing volume loss at the LEFT lung base also similar. Interstitial thickening and ground-glass with nodularity has developed in the posterior RIGHT upper lobe  since previous imaging and generalized interstitial thickening and ground-glass also present in the middle lobe as well as the LEFT lower lobe. Subcentimeter areas of nodularity amidst these changes. In addition a rounded atelectasis there is further increase in soft tissue that tracks towards the RIGHT hilum, this area measuring 4.5 x 4.0 cm (image 101/3) RIGHT of atelectasis peripheral to this is stable as outlined above. Upper Abdomen: Intra-abdominal ascites partially imaged. Aortic atherosclerosis tracks in the visceral branches in the upper abdomen, partially evaluated. Signs of marked renal atrophy not well assessed and incompletely imaged. Musculoskeletal: Signs of renal osteodystrophy. No acute or destructive bone finding. IMPRESSION: 1. Patchy nodularity and ground-glass in the dependent RIGHT upper lobe and bilateral lower lobes raising the question of pneumonitis with distribution raising the question of sequela of aspiration. 2. More dense consolidative changes and irregular bronchial structures in the RIGHT lower lobe central to an area of rounded atelectasis is of uncertain significance. Given the  more masslike appearance in this area and increasing size of mediastinal lymph nodes over time a possibility of neoplasm is considered. PET scan may be helpful on follow-up in addition to contrasted imaging with CT for further assessment. 3. Loculated and mildly nodular appearing pleural fluid in the RIGHT chest with increase and with decreased pleural fluid in the LEFT chest compared to prior imaging. This could also be further assessed with contrasted CT of the chest or PET as warranted. 4. Signs of end-stage renal disease. Aortic Atherosclerosis (ICD10-I70.0). Electronically Signed   By: Zetta Bills M.D.   On: 08/31/2021 19:33   DG CHEST PORT 1 VIEW  Result Date: 08/31/2021 CLINICAL DATA:  Hypoxemia. EXAM: PORTABLE CHEST 1 VIEW COMPARISON:  07/03/2021 and CT chest 07/29/2021. FINDINGS: Trachea is  midline. Heart is enlarged. Loculated pleural effusions bilaterally with scattered airspace opacities, right greater than left. IMPRESSION: Moderate loculated bilateral pleural effusions with scattered associated volume loss. Electronically Signed   By: Lorin Picket M.D.   On: 08/31/2021 10:26   ECHOCARDIOGRAM COMPLETE  Result Date: 08/31/2021    ECHOCARDIOGRAM REPORT   Patient Name:   NEPHI SAVAGE Date of Exam: 08/31/2021 Medical Rec #:  [id number]    Height:       69.0 in Accession #:    [id number]   Weight:       155.0 lb Date of Birth:  [birth year]    BSA:          1.854 m Patient Age:    66 years     BP:           112/81 mmHg Patient Gender: M            HR:           112 bpm. Exam Location:  Inpatient Procedure: 2D Echo Indications:    Murmur  History:        Patient has prior history of Echocardiogram examinations, most                 recent 04/09/2019.  Sonographer:    Arlyss Gandy Referring Phys: [id number] Crista Luria BHAGAT IMPRESSIONS  1. Abnormal septal motion . Left ventricular ejection fraction, by estimation, is 50 to 55%. The left ventricle has low normal function. The left ventricle has no regional wall motion abnormalities. Left ventricular diastolic parameters are indeterminate.  2. Right ventricular systolic function is moderately reduced. The right ventricular size is moderately enlarged. There is moderately elevated pulmonary artery systolic pressure.  3. Left atrial size was severely dilated.  4. Right atrial size was severely dilated.  5. Calcified subchordal apparatus. The mitral valve is abnormal. Mild mitral valve regurgitation. No evidence of mitral stenosis. Moderate mitral annular calcification.  6. The tricuspid valve is abnormal. Tricuspid valve regurgitation is severe.  7. The aortic valve is tricuspid. There is moderate calcification of the aortic valve. Aortic valve regurgitation is not visualized. Aortic valve sclerosis/calcification is present, without any evidence of aortic  stenosis.  8. The inferior vena cava is dilated in size with <50% respiratory variability, suggesting right atrial pressure of 15 mmHg. FINDINGS  Left Ventricle: Abnormal septal motion. Left ventricular ejection fraction, by estimation, is 50 to 55%. The left ventricle has low normal function. The left ventricle has no regional wall motion abnormalities. The left ventricular internal cavity size was normal in size. There is no left ventricular hypertrophy. Left ventricular diastolic parameters are indeterminate. Right Ventricle: The right ventricular size is moderately enlarged. Right vetricular  wall thickness was not assessed. Right ventricular systolic function is moderately reduced. There is moderately elevated pulmonary artery systolic pressure. The tricuspid regurgitant velocity is 3.20 m/s, and with an assumed right atrial pressure of 15 mmHg, the estimated right ventricular systolic pressure is 93.2 mmHg. Left Atrium: Left atrial size was severely dilated. Right Atrium: Right atrial size was severely dilated. Pericardium: There is no evidence of pericardial effusion. Mitral Valve: Calcified subchordal apparatus. The mitral valve is abnormal. There is moderate thickening of the mitral valve leaflet(s). There is mild calcification of the mitral valve leaflet(s). Moderate mitral annular calcification. Mild mitral valve regurgitation. No evidence of mitral valve stenosis. Tricuspid Valve: The tricuspid valve is abnormal. Tricuspid valve regurgitation is severe. No evidence of tricuspid stenosis. Aortic Valve: The aortic valve is tricuspid. There is moderate calcification of the aortic valve. Aortic valve regurgitation is not visualized. Aortic regurgitation PHT measures 629 msec. Aortic valve sclerosis/calcification is present, without any evidence of aortic stenosis. Aortic valve mean gradient measures 3.0 mmHg. Aortic valve peak gradient measures 5.8 mmHg. Aortic valve area, by VTI measures 2.21 cm. Pulmonic  Valve: The pulmonic valve was normal in structure. Pulmonic valve regurgitation is mild. No evidence of pulmonic stenosis. Aorta: The aortic root is normal in size and structure. Venous: The inferior vena cava is dilated in size with less than 50% respiratory variability, suggesting right atrial pressure of 15 mmHg. IAS/Shunts: No atrial level shunt detected by color flow Doppler.  LEFT VENTRICLE PLAX 2D LVIDd:         4.80 cm   Diastology LVIDs:         3.80 cm   LV e' medial:    9.46 cm/s LV PW:         0.90 cm   LV E/e' medial:  11.3 LV IVS:        1.10 cm   LV e' lateral:   10.90 cm/s LVOT diam:     2.00 cm   LV E/e' lateral: 9.8 LV SV:         39 LV SV Index:   21 LVOT Area:     3.14 cm  RIGHT VENTRICLE            IVC RV Basal diam:  4.90 cm    IVC diam: 2.60 cm RV Mid diam:    3.80 cm RV S prime:     5.44 cm/s TAPSE (M-mode): 1.0 cm LEFT ATRIUM              Index        RIGHT ATRIUM           Index LA diam:        5.20 cm  2.81 cm/m   RA Area:     33.50 cm LA Vol (A2C):   119.5 ml 64.46 ml/m  RA Volume:   125.00 ml 67.43 ml/m LA Vol (A4C):   63.1 ml  34.04 ml/m LA Biplane Vol: 91.9 ml  49.57 ml/m  AORTIC VALVE AV Area (Vmax):    2.14 cm AV Area (Vmean):   1.93 cm AV Area (VTI):     2.21 cm AV Vmax:           120.00 cm/s AV Vmean:          78.300 cm/s AV VTI:            0.178 m AV Peak Grad:      5.8 mmHg AV Mean Grad:  3.0 mmHg LVOT Vmax:         81.60 cm/s LVOT Vmean:        48.000 cm/s LVOT VTI:          0.125 m LVOT/AV VTI ratio: 0.70 AI PHT:            629 msec  AORTA Ao Root diam: 3.30 cm MITRAL VALVE                TRICUSPID VALVE MV Area (PHT): 4.10 cm     TR Peak grad:   41.0 mmHg MV Decel Time: 185 msec     TR Vmax:        320.00 cm/s MV E velocity: 107.00 cm/s                             SHUNTS                             Systemic VTI:  0.12 m                             Systemic Diam: 2.00 cm Jenkins Rouge MD Electronically signed by Jenkins Rouge MD Signature Date/Time:  08/31/2021/2:52:51 PM    Final     Cardiac Studies   2D Doppler echocardiogram 08/31/2021 IMPRESSIONS     1. Abnormal septal motion . Left ventricular ejection fraction, by  estimation, is 50 to 55%. The left ventricle has low normal function. The  left ventricle has no regional wall motion abnormalities. Left ventricular  diastolic parameters are  indeterminate.   2. Right ventricular systolic function is moderately reduced. The right  ventricular size is moderately enlarged. There is moderately elevated  pulmonary artery systolic pressure.   3. Left atrial size was severely dilated.   4. Right atrial size was severely dilated.   5. Calcified subchordal apparatus. The mitral valve is abnormal. Mild  mitral valve regurgitation. No evidence of mitral stenosis. Moderate  mitral annular calcification.   6. The tricuspid valve is abnormal. Tricuspid valve regurgitation is  severe.   7. The aortic valve is tricuspid. There is moderate calcification of the  aortic valve. Aortic valve regurgitation is not visualized. Aortic valve  sclerosis/calcification is present, without any evidence of aortic  stenosis.   8. The inferior vena cava is dilated in size with <50% respiratory  variability, suggesting right atrial pressure of 15 mmHg.   Patient Profile     66 y.o. male COPD, hypertension, hyperlipidemia, PAD with critical limb ischemia, atrial fibrillation and end-stage renal disease   Assessment & Plan    Tachycardia with fixed heart rate around 118 bpm: Atrial tachycardia with block versus slow atrial flutter versus ectopic atrial tachycardia.  Twelve-lead EKG to further differentiate.  This rhythm may be ablated below.  May need to get EP involved.  In the meantime, increase metoprolol.  May need a trial of a [place] MURMUR: Echo shows abnormal mitral valve with mild mitral regurgitation, tricuspid regurgitation is severe, right atrium is severely dilated, right ventricle is moderately  dilated.  The murmur is predominantly related to tricuspid regurgitation. CRITICAL limb ischemia: Being managed by [facility].  For questions or updates, please contact [place] Please consult [email or web address] for contact info under        Signed, Sinclair Grooms, MD  09/01/2021, 3:21 PM

## 2021-09-01 NOTE — Progress Notes (Signed)
NAME:  Scott Castro, MRN:  [id number], DOB:  Sep 26, 1955, LOS: 2 ADMISSION DATE:  08/30/2021, CONSULTATION DATE:  08/31/21 REFERRING MD:  Dr Tyrell Antonio, CHIEF COMPLAINT: Abnormal chest x-ray  History of Present Illness:  66 yo M PMH ESRD, HTN, Afib on eliquis, COPD with chronic hypoxic respiratory failure on 2-3LNC, PAD/critical limb ischemia BLE s/p R BKA (08/07/21) during hospitalization at [facility] 12/28-1/4, who presented to [facility] from inpt rehab on 08/30/21 for L BKA. Admitted to [facility] and planned for L BKA 1/24. On 1/23 a CXR was obtained which revealed R>L pleural effusions. PCCM was consulted in this setting    In review of prior imaging and records: CT chest performed 07/29/21 which had increase in size of L sided loculated appearing pleural fluid, R sided loculated appearing pleural effusion, pleural thickening, R sided subpleural scarring, enlarged lymph nodes (71mmR paratracheal node, 48mm subcarinal, scattered throughout chest), small pericardial effusion.    Bilateral pleural effusions were noted on 2020 HRCT, at which time their appearance was felt c/w asbestosis   Pertinent  Medical History   Past Medical History:  Diagnosis Date   Barrett's esophagus    Chronic kidney disease    CHRONIC\   Glomerulosclerosis, focal    Hyperparathyroidism due to renal insufficiency (HCC)   End-stage renal disease on hemodialysis Chronic bilateral pleural effusion Chronic obstructive pulmonary disease Chronic hypoxemic respiratory failure Atrial fibrillation on Eliquis Critical limb ischemia S/p right BKA Hypertension Pericardial effusion   Significant Hospital Events: Including procedures, antibiotic start and stop dates in addition to other pertinent events   1/22 admitted from CIR with critical limb ischemia 1/23 PCCM consulted for bilateral effusions  Interim History / Subjective:  Denies any significant complaints today, Has had a cough Has had shortness of breath  Objective   Blood  pressure 119/74, pulse (!) 112, temperature 98.3 F (36.8 C), temperature source Oral, resp. rate 16, height 5\' 9"  (1.753 m), weight 71 kg, SpO2 100 %.        Intake/Output Summary (Last 24 hours) at 09/01/2021 0735 Last data filed at 09/01/2021 8841 Gross per 24 hour  Intake 1201.66 ml  Output 0 ml  Net 1201.66 ml   Filed Weights   08/31/21 1500 09/01/21 0400  Weight: 70.3 kg 71 kg    Examination: General: Chronically ill-appearing HENT: Moist oral mucosa Lungs: Decreased air movement bilaterally Cardiovascular: S1-S2 appreciated, systolic murmur Abdomen: Soft, bowel sounds appreciated Extremities: Right BKA, cyanotic left foot Neuro: Alert and oriented, moving all extremities GU: [facility] Problem list     Assessment & Plan:  Loculated pleural effusion -Will request for IR thoracentesis and fluid sent for analysis  Underlying concern for asbestos related lung disease, asbestos related pleural effusion based on previous CT scan findings Had a previous CT showing a left loculated effusion which appears improved on recent CT Findings on current CT at the base of the lung consistent with rounded atelectasis which would be consistent with asbestos exposure in the past, same concern was present on a previous CT  No fever, no leukocytosis, breathing appears to be stable -Do not believe there is any role for antibiotics at present  Chronic obstructive pulmonary disease -Continue bronchodilators  Critical limb ischemia -Will likely require left BKA  Atrial fibrillation -On heparin at present, was on Eliquis as outpatient  End-stage renal disease -Hemodialysis per nephrology  History of hyperlipidemia History of hypertension Barrett's esophagus-on PPI  Sherrilyn Rist, MD [place] [facility] Pager: See Shea Evans

## 2021-09-01 NOTE — Procedures (Signed)
Patient was seen on dialysis and the procedure was supervised.  BFR 400  Via AVF BP is  116/35.   Patient appears to be tolerating treatment well  Louis Meckel 09/01/2021

## 2021-09-01 NOTE — Progress Notes (Signed)
ANTICOAGULATION CONSULT NOTE - Follow Up Consult  Pharmacy Consult for Apixaban >> Heparin Indication: atrial fibrillation  No Known Allergies  Patient Measurements: Height: 5\' 9"  (175.3 cm) Weight: 71 kg (156 lb 8.4 oz) IBW/kg (Calculated) : 70.7 Heparin Dosing Weight: 74.6 kg  Vital Signs: Temp: 98.3 F (36.8 C) (01/24 0340) Temp Source: Oral (01/24 0340) BP: 119/74 (01/24 0340) Pulse Rate: 112 (01/24 0340)  Labs: Recent Labs    08/29/21 1629 08/29/21 2132 08/30/21 0652 08/30/21 1006 08/31/21 0145 09/01/21 0159  HGB  --   --  10.4*  --   --  8.3*  HCT  --   --  33.1*  --   --  26.4*  PLT  --   --  287  --   --  238  APTT  --    < > 56* 66* 96* 73*  HEPARINUNFRC  --   --  >1.10*  --  0.90* 0.60  CREATININE 5.82*  --   --   --  5.01* 6.37*   < > = values in this interval not displayed.     Estimated Creatinine Clearance: 11.4 mL/min (A) (by C-G formula based on SCr of 6.37 mg/dL (H)).   Assessment: 66 yo M recently had right BKA for PAD. He has been on apixaban for his AF.   He is s/p left angiogram on 1/20 with no options for revascularization and will need a left BKA likely tomorrow 1/25.   He was transferred from Rehab to 4E for closer monitoring.  aPTT continues to be at goal this morning at 73s, heparin level at goal 0.6. No plans for rate adjustment. Levels correlated enough to stop aptt checks.   Goal of Therapy:  Heparin level 0.3-0.7 units/ml aPTT 66-102 seconds Monitor platelets by anticoagulation protocol: Yes   Plan:  Continue heparin gtt at 1950 units/hr. For for BKA 1/25  Erin Hearing PharmD., BCPS Clinical Pharmacist 09/01/2021 7:44 AM

## 2021-09-01 NOTE — Progress Notes (Signed)
PROGRESS NOTE    Scott Castro  NLZ:[id number] DOB: 15-Jul-1956 DOA: 08/30/2021 PCP: Franciso Bend, MD   Brief Narrative: 66 year old past medical history significant for ESRD on dialysis TTS, hypertension, hyperlipidemia, PAF, depression, chronic respiratory failure 2 L of oxygen, PAD recent right BKA on 08/07/2021 secondary to critical limb ischemia discharged to rehab.  He was seen by cardiology in inpatient rehab due to A. fib RVR and he was referred for inpatient telemetry admission.  Recent hospitalized at [facility] from 12/28 until 1//2023 right lower extremity critical limb ischemia status post BKA by vascular on 12/30.  Was just discharged to inpatient rehab here at [facility].  Found to be in RVR RVR admitted for further evaluation.  Patient  also had angiography 1/20 by Dr. [name] with BL diffuse infrapopliteal disease of the left lower extremity, vascular is recommending left BKA early this week.  Patient noted to be more hypoxic on 1/23, chest x-ray showed bilateral loculated pleural effusion, pulmonologist consulted for further evaluation. CT chest showed right loculated pleural effusion on right. Right lower lobe with irregular consolidation right lower lobe, he will need PET scan out patient.   Assessment & Plan:   Principal Problem:   Critical limb ischemia of left lower extremity/PAD  Active Problems:   COPD (chronic obstructive pulmonary disease) (HCC)   Chronic respiratory failure with hypoxia (HCC)   ESRD (end stage renal disease) (HCC)   Barrett esophagus   Anemia   Mixed hyperlipidemia   S/P BKA (below knee amputation), right (HCC)   Depressive reaction   AF (paroxysmal atrial fibrillation) (HCC)  1-Critical limb ischemia of left lower extremity/PAD -Angiography 1/20 by Dr. Luan Pulling revealed diffuse infrapopliteal disease of the left with no revascularization options -Plan for left BKA 1/25th -Currently on heparin drip for A. Fib -Recent BKA on the right  12/22   2-A. Fib Heparin drip Continue with  metoprolol and Cardizem.  Appreciate cardiology assistance.   3-Acute on chronic hypoxic respiratory failure Loculated bilateral moderate pleural effusion COPD -Pulmonologist consulted -Incentive Spirometry ordered -Continue with nebulizer as needed -CT chest 1/23: Patchy nodularity and ground-glass in the dependent RIGHT upper lobe and bilateral lower lobes raising the question of pneumonitis with distribution raising the question of sequela of aspiration. More dense consolidative changes and irregular bronchial structures in the RIGHT lower lobe central to an area of rounded atelectasis is of uncertain significance. Given the more mass like appearance in this area and increasing size of mediastinal lymph nodes over time a possibility of neoplasm is considered. PET scan may be helpful on follow-up in addition to contrasted imaging with CT for further assessment.  Loculated and mildly nodular appearing pleural fluid in the RIGHT chest with increase and with decreased pleural fluid in the LEFT chest compared to prior imaging.  -IR consulted for thoracentesis.  Stable on 2 L oxygen.   4-ESRD on hemodialysis -S/p renal transplant [place] with hemodialysis -Nephrology  following =had HD today 1/24.  Barrett esophagus: Continue with PPI  Anemia:  anemia of chronic disease: IV iron per nephrology  Hyperlipidemia: Continue with statins  Depression: Continue with Zoloft  Hyponatremia; fluid management with HD>     Pressure Injury 08/26/21 Heel Left Deep Tissue Pressure Injury - Purple or maroon localized area of discolored intact skin or blood-filled blister due to damage of underlying soft tissue from pressure and/or shear. (Active)  08/26/21 1826  Location: Heel  Location Orientation: Left  Staging: Deep Tissue Pressure Injury -  Purple or maroon localized area of discolored intact skin or blood-filled blister due to damage of  underlying soft tissue from pressure and/or shear.  Wound Description (Comments):   Present on Admission: No                  Estimated body mass index is 23.11 kg/m as calculated from the following:   Height as of this encounter: 5\' 9"  (1.753 m).   Weight as of this encounter: 71 kg.   DVT prophylaxis: Heparin Gtt Code Status: Full code Family Communication: care discussed with patient  Disposition Plan:  Status is: Inpatient  Remains inpatient appropriate because: management of critical limb ischemia. Resp failure        Consultants:  Nephrology Cardiology Vascular.  Pulmonologist   Procedures:    Antimicrobials:    Subjective: No new complaints, report right LE pain. Denies worsening dyspnea.  Objective: Vitals:   09/01/21 0000 09/01/21 0340 09/01/21 0342 09/01/21 0400  BP: 127/78 119/74    Pulse:  (!) 112    Resp: 15 17 16    Temp:  98.3 F (36.8 C)    TempSrc:  Oral    SpO2:  100%    Weight:    71 kg  Height:        Intake/Output Summary (Last 24 hours) at 09/01/2021 0728 Last data filed at 09/01/2021 0342 Gross per 24 hour  Intake 1201.66 ml  Output 0 ml  Net 1201.66 ml    Filed Weights   08/31/21 1500 09/01/21 0400  Weight: 70.3 kg 71 kg    Examination:  General exam: Chronic ill appearing Respiratory system: BL crackles.  Cardiovascular system: S 1, S 2 IRR Gastrointestinal system: BS present, soft nt Central nervous system: alert, oriented.  Extremities: R BKA left LE foot with erythema, blister, purpura.     Data Reviewed: I have personally reviewed following labs and imaging studies  CBC: Recent Labs  Lab 08/26/21 1004 08/27/21 0515 08/27/21 1830 08/28/21 0441 08/29/21 0451 08/30/21 0652 09/01/21 0159  WBC 7.5   < > 6.8 5.4 6.6 8.4 7.2  NEUTROABS 5.7  --   --   --   --   --   --   HGB 7.2*   < > 7.5* 8.4* 8.0* 10.4* 8.3*  HCT 23.1*   < > 24.0* 26.5* 26.0* 33.1* 26.4*  MCV 99.1   < > 98.8 95.3 96.7 97.6  96.0  PLT 276   < > 368 317 294 287 238   < > = values in this interval not displayed.    Basic Metabolic Panel: Recent Labs  Lab 08/26/21 1004 08/27/21 1830 08/29/21 1629 08/31/21 0145 09/01/21 0159  NA 131* 128* 133* 131* 129*  K 3.6 4.1 3.7 3.9 3.9  CL 95* 92* 98 94* 95*  CO2 27 23 24 24 24   GLUCOSE 134* 104* 144* 96 88  BUN 31* 49* 38* 30* 39*  CREATININE 4.82* 6.45* 5.82* 5.01* 6.37*  CALCIUM 7.3* 7.2* 7.3* 7.6* 7.7*  PHOS 2.7 3.5 3.2  --   --     GFR: Estimated Creatinine Clearance: 11.4 mL/min (A) (by C-G formula based on SCr of 6.37 mg/dL (H)). Liver Function Tests: Recent Labs  Lab 08/26/21 1004 08/27/21 1830 08/29/21 1629  ALBUMIN 1.8* 1.8* 1.9*    No results for input(s): LIPASE, AMYLASE in the last 168 hours. No results for input(s): AMMONIA in the last 168 hours. Coagulation Profile: No results for input(s): INR, PROTIME in the  last 168 hours. Cardiac Enzymes: No results for input(s): CKTOTAL, CKMB, CKMBINDEX, TROPONINI in the last 168 hours. BNP (last 3 results) No results for input(s): PROBNP in the last 8760 hours. HbA1C: No results for input(s): HGBA1C in the last 72 hours. CBG: Recent Labs  Lab 08/26/21 0811  GLUCAP 162*    Lipid Profile: No results for input(s): CHOL, HDL, LDLCALC, TRIG, CHOLHDL, LDLDIRECT in the last 72 hours.  Thyroid Function Tests: No results for input(s): TSH, T4TOTAL, FREET4, T3FREE, THYROIDAB in the last 72 hours. Anemia Panel: No results for input(s): VITAMINB12, FOLATE, FERRITIN, TIBC, IRON, RETICCTPCT in the last 72 hours. Sepsis Labs: No results for input(s): PROCALCITON, LATICACIDVEN in the last 168 hours.  No results found for this or any previous visit (from the past 240 hour(s)).       Radiology Studies: CT CHEST WO CONTRAST  Result Date: 08/31/2021 CLINICAL DATA:  Loculated LEFT pleural effusion EXAM: CT CHEST WITHOUT CONTRAST TECHNIQUE: Multidetector CT imaging of the chest was performed  following the standard protocol without IV contrast. RADIATION DOSE REDUCTION: This exam was performed according to the departmental dose-optimization program which includes automated exposure control, adjustment of the mA and/or kV according to patient size and/or use of iterative reconstruction technique. COMPARISON:  July 29, 2021 and high-resolution chest CT from April of 2020. FINDINGS: Cardiovascular: Calcified atheromatous plaque of the thoracic aorta without aneurysmal dilation. Moderate cardiomegaly without substantial change. Small amount of pericardial fluid is diminished compared to previous imaging. Three-vessel coronary artery disease, extensive coronary artery calcifications as before. Mitral annular calcifications. Central pulmonary vasculature 3.3 cm similar to previous imaging. Mediastinum/Nodes: 18 mm RIGHT paratracheal lymph node, previously 16 mm (image 70/4) Small pre-vascular lymph nodes less than a cm are stable, enlarged compared to more remote studies. Mildly enlarged paratracheal lymph nodes also with slight enlargement since recent imaging (image 60/4) 15 mm short axis, previously 12 mm. Similar slight enlargement of other lymph nodes throughout the RIGHT paratracheal chain. Mild subcarinal nodal enlargement approximately 12 mm short axis is similar to prior imaging. No hilar adenopathy to the extent evaluated on noncontrast imaging. The esophagus is normal on limited assessment. No axillary adenopathy. Lungs/Pleura: Lenticular loculated RIGHT-sided pleural effusion along the lateral RIGHT chest (image 56/4) minimal fluid in this location before now measuring 7.2 x 2.9 cm greatest axial dimension, contiguous with loculated pleural fluid in the inferior RIGHT chest. Suspect mild underlying nodularity. Pleural fluid is circumferential in the RIGHT chest and increased in the medial RIGHT upper chest (image 36/4) 15 mm short axis, thickness of pleural fluid at the RIGHT lung apex. Other  areas of loculated pleural fluid in the inferior RIGHT chest with similar appearance. Suspected rounded atelectasis amidst chronic pleural fluid in the RIGHT lower chest is similar. Loculated pleural fluid in the major fissure in the LEFT chest is diminished in size (image 77/4) 5.8 x 5.1 cm as compared to 10.0 x 5.2 cm. Chronic appearing volume loss at the LEFT lung base also similar. Interstitial thickening and ground-glass with nodularity has developed in the posterior RIGHT upper lobe since previous imaging and generalized interstitial thickening and ground-glass also present in the middle lobe as well as the LEFT lower lobe. Subcentimeter areas of nodularity amidst these changes. In addition a rounded atelectasis there is further increase in soft tissue that tracks towards the RIGHT hilum, this area measuring 4.5 x 4.0 cm (image 101/3) RIGHT of atelectasis peripheral to this is stable as outlined above. Upper Abdomen: Intra-abdominal ascites partially  imaged. Aortic atherosclerosis tracks in the visceral branches in the upper abdomen, partially evaluated. Signs of marked renal atrophy not well assessed and incompletely imaged. Musculoskeletal: Signs of renal osteodystrophy. No acute or destructive bone finding. IMPRESSION: 1. Patchy nodularity and ground-glass in the dependent RIGHT upper lobe and bilateral lower lobes raising the question of pneumonitis with distribution raising the question of sequela of aspiration. 2. More dense consolidative changes and irregular bronchial structures in the RIGHT lower lobe central to an area of rounded atelectasis is of uncertain significance. Given the more masslike appearance in this area and increasing size of mediastinal lymph nodes over time a possibility of neoplasm is considered. PET scan may be helpful on follow-up in addition to contrasted imaging with CT for further assessment. 3. Loculated and mildly nodular appearing pleural fluid in the RIGHT chest with  increase and with decreased pleural fluid in the LEFT chest compared to prior imaging. This could also be further assessed with contrasted CT of the chest or PET as warranted. 4. Signs of end-stage renal disease. Aortic Atherosclerosis (ICD10-I70.0). Electronically Signed   By: Zetta Bills M.D.   On: 08/31/2021 19:33   DG CHEST PORT 1 VIEW  Result Date: 08/31/2021 CLINICAL DATA:  Hypoxemia. EXAM: PORTABLE CHEST 1 VIEW COMPARISON:  07/03/2021 and CT chest 07/29/2021. FINDINGS: Trachea is midline. Heart is enlarged. Loculated pleural effusions bilaterally with scattered airspace opacities, right greater than left. IMPRESSION: Moderate loculated bilateral pleural effusions with scattered associated volume loss. Electronically Signed   By: Lorin Picket M.D.   On: 08/31/2021 10:26   ECHOCARDIOGRAM COMPLETE  Result Date: 08/31/2021    ECHOCARDIOGRAM REPORT   Patient Name:   Scott HERNAN Date of Exam: 08/31/2021 Medical Rec #:  [id number]    Height:       69.0 in Accession #:    [id number]   Weight:       155.0 lb Date of Birth:  18-Sep-1955    BSA:          1.854 m Patient Age:    78 years     BP:           112/81 mmHg Patient Gender: M            HR:           112 bpm. Exam Location:  Inpatient Procedure: 2D Echo Indications:    Murmur  History:        Patient has prior history of Echocardiogram examinations, most                 recent 04/09/2019.  Sonographer:    Arlyss Gandy Referring Phys: [id number] Crista Luria BHAGAT IMPRESSIONS  1. Abnormal septal motion . Left ventricular ejection fraction, by estimation, is 50 to 55%. The left ventricle has low normal function. The left ventricle has no regional wall motion abnormalities. Left ventricular diastolic parameters are indeterminate.  2. Right ventricular systolic function is moderately reduced. The right ventricular size is moderately enlarged. There is moderately elevated pulmonary artery systolic pressure.  3. Left atrial size was severely dilated.  4.  Right atrial size was severely dilated.  5. Calcified subchordal apparatus. The mitral valve is abnormal. Mild mitral valve regurgitation. No evidence of mitral stenosis. Moderate mitral annular calcification.  6. The tricuspid valve is abnormal. Tricuspid valve regurgitation is severe.  7. The aortic valve is tricuspid. There is moderate calcification of the aortic valve. Aortic valve regurgitation is not visualized. Aortic valve sclerosis/calcification is present,  without any evidence of aortic stenosis.  8. The inferior vena cava is dilated in size with <50% respiratory variability, suggesting right atrial pressure of 15 mmHg. FINDINGS  Left Ventricle: Abnormal septal motion. Left ventricular ejection fraction, by estimation, is 50 to 55%. The left ventricle has low normal function. The left ventricle has no regional wall motion abnormalities. The left ventricular internal cavity size was normal in size. There is no left ventricular hypertrophy. Left ventricular diastolic parameters are indeterminate. Right Ventricle: The right ventricular size is moderately enlarged. Right vetricular wall thickness was not assessed. Right ventricular systolic function is moderately reduced. There is moderately elevated pulmonary artery systolic pressure. The tricuspid regurgitant velocity is 3.20 m/s, and with an assumed right atrial pressure of 15 mmHg, the estimated right ventricular systolic pressure is 24.5 mmHg. Left Atrium: Left atrial size was severely dilated. Right Atrium: Right atrial size was severely dilated. Pericardium: There is no evidence of pericardial effusion. Mitral Valve: Calcified subchordal apparatus. The mitral valve is abnormal. There is moderate thickening of the mitral valve leaflet(s). There is mild calcification of the mitral valve leaflet(s). Moderate mitral annular calcification. Mild mitral valve regurgitation. No evidence of mitral valve stenosis. Tricuspid Valve: The tricuspid valve is abnormal.  Tricuspid valve regurgitation is severe. No evidence of tricuspid stenosis. Aortic Valve: The aortic valve is tricuspid. There is moderate calcification of the aortic valve. Aortic valve regurgitation is not visualized. Aortic regurgitation PHT measures 629 msec. Aortic valve sclerosis/calcification is present, without any evidence of aortic stenosis. Aortic valve mean gradient measures 3.0 mmHg. Aortic valve peak gradient measures 5.8 mmHg. Aortic valve area, by VTI measures 2.21 cm. Pulmonic Valve: The pulmonic valve was normal in structure. Pulmonic valve regurgitation is mild. No evidence of pulmonic stenosis. Aorta: The aortic root is normal in size and structure. Venous: The inferior vena cava is dilated in size with less than 50% respiratory variability, suggesting right atrial pressure of 15 mmHg. IAS/Shunts: No atrial level shunt detected by color flow Doppler.  LEFT VENTRICLE PLAX 2D LVIDd:         4.80 cm   Diastology LVIDs:         3.80 cm   LV e' medial:    9.46 cm/s LV PW:         0.90 cm   LV E/e' medial:  11.3 LV IVS:        1.10 cm   LV e' lateral:   10.90 cm/s LVOT diam:     2.00 cm   LV E/e' lateral: 9.8 LV SV:         39 LV SV Index:   21 LVOT Area:     3.14 cm  RIGHT VENTRICLE            IVC RV Basal diam:  4.90 cm    IVC diam: 2.60 cm RV Mid diam:    3.80 cm RV S prime:     5.44 cm/s TAPSE (M-mode): 1.0 cm LEFT ATRIUM              Index        RIGHT ATRIUM           Index LA diam:        5.20 cm  2.81 cm/m   RA Area:     33.50 cm LA Vol (A2C):   119.5 ml 64.46 ml/m  RA Volume:   125.00 ml 67.43 ml/m LA Vol (A4C):   63.1 ml  34.04 ml/m LA  Biplane Vol: 91.9 ml  49.57 ml/m  AORTIC VALVE AV Area (Vmax):    2.14 cm AV Area (Vmean):   1.93 cm AV Area (VTI):     2.21 cm AV Vmax:           120.00 cm/s AV Vmean:          78.300 cm/s AV VTI:            0.178 m AV Peak Grad:      5.8 mmHg AV Mean Grad:      3.0 mmHg LVOT Vmax:         81.60 cm/s LVOT Vmean:        48.000 cm/s LVOT VTI:           0.125 m LVOT/AV VTI ratio: 0.70 AI PHT:            629 msec  AORTA Ao Root diam: 3.30 cm MITRAL VALVE                TRICUSPID VALVE MV Area (PHT): 4.10 cm     TR Peak grad:   41.0 mmHg MV Decel Time: 185 msec     TR Vmax:        320.00 cm/s MV E velocity: 107.00 cm/s                             SHUNTS                             Systemic VTI:  0.12 m                             Systemic Diam: 2.00 cm Jenkins Rouge MD Electronically signed by Jenkins Rouge MD Signature Date/Time: 08/31/2021/2:52:51 PM    Final         Scheduled Meds:  atorvastatin  10 mg Oral QHS   cinacalcet  60 mg Oral Q supper   Darbepoetin Alfa  60 mcg Intravenous Q Tue-HD   diltiazem  120 mg Oral Daily   docusate sodium  100 mg Oral TID   gabapentin  100 mg Oral BID   hydrocortisone  25 mg Rectal BID   metoprolol tartrate  25 mg Oral BID   midodrine  10 mg Oral TID WC   pantoprazole  40 mg Oral Daily   polyethylene glycol  17 g Oral Daily   senna  2 tablet Oral QHS   sertraline  100 mg Oral Daily   sevelamer carbonate  2.4 g Oral TID WC   Continuous Infusions:  sodium chloride     ferric gluconate (FERRLECIT) IVPB     heparin 1,950 Units/hr (09/01/21 0510)     LOS: 2 days    Time spent: 35 Minutes.     Elmarie Shiley, MD [facility]   If 7PM-7AM, please contact night-coverage [email or web address]  09/01/2021, 7:28 AM

## 2021-09-01 NOTE — Progress Notes (Signed)
This chaplain responded to PMT consult for spiritual care and companionship during the Pt. admission. The chaplain understands the Pt. returned from HD earlier today. The Pt. replied "he has not had an appetite today and commented on the lack of food choice" when the chaplain inquired about his uneaten lunch.  The chaplain learned through reflective listening the Pt. is interested in engaging in community. The chaplain understands the Pt. is willing to participate in person or virtually in faith and medical support groups. The Pt. added the positive possibility of sharing a meal together with the groups as the chaplain explored opportunities with the Pt.   The Pt. 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  The chaplain understands the Pt. personal faith leads him in trusting a "one day at a time" lifestyle.   The Pt. accepted the chaplain's invitation to prayer and F/U spiritual care.  Chaplain Sallyanne Kuster [phone]

## 2021-09-01 NOTE — Progress Notes (Signed)
Met with pt at bedside and provided schedule letter for [facility]. Pt advised of MWF schedule. Pt will need to arrive at 9:50 for first appt (10:35 chair time). Explained to pt that clinic may need to be changed pending disposition arrangements. Will assist as needed.   Melven Sartorius Renal Navigator [phone]

## 2021-09-01 NOTE — Progress Notes (Signed)
Inpatient Rehab Admissions Coordinator Note:   Per therapy patient was screened for CIR candidacy by Michel Santee, PT. At this time, pt appears to be a potential candidate for CIR. I will place an order for rehab consult for full assessment, per our protocol.  Please contact me any with questions.Shann Medal, PT, DPT [phone] 09/01/21 3:18 PM

## 2021-09-01 NOTE — Evaluation (Signed)
Clinical/Bedside Swallow Evaluation Patient Details  Name: METRO EDENFIELD MRN: [id number] Date of Birth: August 05, 1956  Today's Date: 09/01/2021 Time: SLP Start Time (ACUTE ONLY): 1433 SLP Stop Time (ACUTE ONLY): 1445 SLP Time Calculation (min) (ACUTE ONLY): 12 min  Past Medical History:  Past Medical History:  Diagnosis Date   Barrett's esophagus    Chronic kidney disease    CHRONIC\   Glomerulosclerosis, focal    Hyperparathyroidism due to renal insufficiency [facility])    Past Surgical History:  Past Surgical History:  Procedure Laterality Date   ABDOMINAL AORTOGRAM W/LOWER EXTREMITY N/A 08/28/2021   Procedure: ABDOMINAL AORTOGRAM W/LOWER EXTREMITY;  Surgeon: Cherre Robins, MD;  Location: [place] CV LAB;  Service: Cardiovascular;  Laterality: N/A;   AMPUTATION Right 08/07/2021   Procedure: AMPUTATION BELOW KNEE;  Surgeon: Algernon Huxley, MD;  Location: [facility] ORS;  Service: Vascular;  Laterality: Right;   AV FISTULA PLACEMENT     COLONOSCOPY N/A 03/22/2016   Procedure: COLONOSCOPY;  Surgeon: Manya Silvas, MD;  Location: [facility] ENDOSCOPY;  Service: Endoscopy;  Laterality: N/A;   DG AV DIALYSIS GRAFT DECLOT OR     ESOPHAGOGASTRODUODENOSCOPY (EGD) WITH PROPOFOL  03/22/2016   Procedure: ESOPHAGOGASTRODUODENOSCOPY (EGD) WITH PROPOFOL;  Surgeon: Manya Silvas, MD;  Location: [facility] ENDOSCOPY;  Service: Endoscopy;;   FLEXIBLE BRONCHOSCOPY     KIDNEY TRANSPLANT Right 1985   LOWER EXTREMITY ANGIOGRAPHY Right 07/20/2021   Procedure: Lower Extremity Angiography;  Surgeon: Algernon Huxley, MD;  Location: [place] CV LAB;  Service: Cardiovascular;  Laterality: Right;   LOWER EXTREMITY ANGIOGRAPHY Right 08/06/2021   Procedure: Lower Extremity Angiography;  Surgeon: Algernon Huxley, MD;  Location: [place] CV LAB;  Service: Cardiovascular;  Laterality: Right;   REMOVAL TENCKHOFF CATH     HPI:  The pt is a 66 yo male presenting on 1/20 from [facility] with bilateral diffuse infrapopliteal disease of  the left lower extremity, vascular is recommending left BKA. Recent hospitalization at [facility] (08/05/21-08/12/21) for R BKA on 12/30 with d/c to [facility]. PMH including  ESRD on dialysis TTS, hypertension, hyperlipidemia, PAF, depression, and chronic respiratory failure 2 L of oxygen, barrett's esophagus.    Assessment / Plan / Recommendation  Clinical Impression  Pt participated in limited clinical swallow evaluation, declining solid foods due to concerns for N/V. Oral mechanism exam was normal with no focal deficits; dentition in adequate condition. He consumed thin liquids with no s/s of  dysphagia. He was specific in c/o pills lodging in throat and inability to swallow more than a few at a time.  We discussed either crushing meds in puree or swallowing pills in smaller quantities and doing so with applesauce/pudding so that the pills/pudding travel together in one cohesive mass. Pt agreed with plan. I let his let nurse know.  If problem persists, please reconsult. Otherwise, will sign off. SLP Visit Diagnosis: Dysphagia, unspecified (R13.10)    Aspiration Risk  No limitations    Diet Recommendation   Continue current diet  Medication Administration: Whole meds with puree    Other  Recommendations Oral Care Recommendations: Oral care BID    Recommendations for follow up therapy are one component of a multi-disciplinary discharge planning process, led by the attending physician.  Recommendations may be updated based on patient status, additional functional criteria and insurance authorization.  Follow up Recommendations No SLP follow up      Assistance Recommended at Discharge    Functional Status Assessment    Frequency and Duration  Prognosis        Swallow Study   General HPI: The pt is a 66 yo male presenting on 1/20 from [facility] with bilateral diffuse infrapopliteal disease of the left lower extremity, vascular is recommending left BKA. Recent hospitalization at [facility]  (08/05/21-08/12/21) for R BKA on 12/30 with d/c to [facility]. PMH including  ESRD on dialysis TTS, hypertension, hyperlipidemia, PAF, depression, and chronic respiratory failure 2 L of oxygen, barrett's esophagus. Type of Study: Bedside Swallow Evaluation Previous Swallow Assessment: no Diet Prior to this Study: Regular;Thin liquids Temperature Spikes Noted: No Respiratory Status: Nasal cannula History of Recent Intubation: No Behavior/Cognition: Alert;Cooperative Oral Cavity Assessment: Within Functional Limits Oral Care Completed by SLP: No Oral Cavity - Dentition: Adequate natural dentition Vision: Functional for self-feeding Self-Feeding Abilities: Able to feed self Patient Positioning: Upright in bed Baseline Vocal Quality: Normal Volitional Cough: Strong Volitional Swallow: Able to elicit    Oral/Motor/Sensory Function Overall Oral Motor/Sensory Function: Within functional limits   Ice Chips Ice chips: Not tested   Thin Liquid Thin Liquid: Within functional limits    Nectar Thick Nectar Thick Liquid: Not tested   Honey Thick Honey Thick Liquid: Not tested   Puree Puree: Not tested   Solid     Solid: Not tested      [name] 09/01/2021,2:54 PM  Estill Bamberg L. Tivis Ringer, [place] Office number [phone] Pager [phone]

## 2021-09-01 NOTE — Progress Notes (Addendum)
[facility] [place]  Subjective  - Depressed  Assessment/Planning: Left LE ischemic changes s/p angiogram 08/28/21 sever tibial disease without revascularization options.  Plan for left BKA 09/02/21.  Patient understands and agrees with this plan. NPO past MN    Objective 119/74 (!) 112 98.3 F (36.8 C) (Oral) 16 100%  Intake/Output Summary (Last 24 hours) at 09/01/2021 0725 Last data filed at 09/01/2021 0342 Gross per 24 hour  Intake 1201.66 ml  Output 0 ml  Net 1201.66 ml    Left LE dressing in place Left LE in dependent position hanging off the bed due to pain Lungs non labored breathing    Roxy Horseman 09/01/2021 7:25 AM --  Laboratory Lab Results: Recent Labs    08/30/21 0652 09/01/21 0159  WBC 8.4 7.2  HGB 10.4* 8.3*  HCT 33.1* 26.4*  PLT 287 238   BMET Recent Labs    08/31/21 0145 09/01/21 0159  NA 131* 129*  K 3.9 3.9  CL 94* 95*  CO2 24 24  GLUCOSE 96 88  BUN 30* 39*  CREATININE 5.01* 6.37*  CALCIUM 7.6* 7.7*    COAG Lab Results  Component Value Date   INR 1.7 (H) 08/05/2021   INR 1.1 07/18/2021   INR 1.2 07/03/2021   No results found for: PTT  VASCULAR STAFF ADDENDUM: I agree with the above.  Patient in HD on my rounds. We discussed the plan yesterday. He is in agreement. NPO after midnight. Orders for consent written.  [name], MD [facility] Phone Number: [phone] 09/01/2021 8:02 AM

## 2021-09-01 NOTE — H&P (View-Only) (Signed)
[facility] [place]  Subjective  - Depressed  Assessment/Planning: Left LE ischemic changes s/p angiogram 08/28/21 sever tibial disease without revascularization options.  Plan for left BKA 09/02/21.  Patient understands and agrees with this plan. NPO past MN    Objective 119/74 (!) 112 98.3 F (36.8 C) (Oral) 16 100%  Intake/Output Summary (Last 24 hours) at 09/01/2021 0725 Last data filed at 09/01/2021 0342 Gross per 24 hour  Intake 1201.66 ml  Output 0 ml  Net 1201.66 ml    Left LE dressing in place Left LE in dependent position hanging off the bed due to pain Lungs non labored breathing    Roxy Horseman 09/01/2021 7:25 AM --  Laboratory Lab Results: Recent Labs    08/30/21 0652 09/01/21 0159  WBC 8.4 7.2  HGB 10.4* 8.3*  HCT 33.1* 26.4*  PLT 287 238   BMET Recent Labs    08/31/21 0145 09/01/21 0159  NA 131* 129*  K 3.9 3.9  CL 94* 95*  CO2 24 24  GLUCOSE 96 88  BUN 30* 39*  CREATININE 5.01* 6.37*  CALCIUM 7.6* 7.7*    COAG Lab Results  Component Value Date   INR 1.7 (H) 08/05/2021   INR 1.1 07/18/2021   INR 1.2 07/03/2021   No results found for: PTT  VASCULAR STAFF ADDENDUM: I agree with the above.  Patient in HD on my rounds. We discussed the plan yesterday. He is in agreement. NPO after midnight. Orders for consent written.  [name], MD [facility] Phone Number: [phone] 09/01/2021 8:02 AM

## 2021-09-01 NOTE — Progress Notes (Signed)
[facility] Progress Note   66 y.o. male PMH COPD Barrett's, hyperparathyroidism, ESRD, failed renal transplant, PAF on Eliquis p/w ischemic foot ulcer -> BKA on 12/30.  Patient receives his dialysis treatments at [place] with [facility].   Dialysis at [facility] with [facility] EDW 68.4kg? EDW before the amputation was 72kg     Assessment/ Plan:   ESRD - TTS at [facility] w/ [facility] - establishing new EDW  - HD set up for MWF outpt now, though this may further change, see below. Due to staffing-  his HD got put off to today (Tuesday)  now plan is for amputation on Wed- so will keep on TTS for now   -  Uses elisio dialyzer at [place] but has been tolerating our standard optiflux dialyzer here  [name]- last Phos 1/1 was 5; 1/16 phos 3.4 [name] 2.4 grams packet TIDM and 1600 with snacks-  will decrease given phos of 3 . [name] 60mg  qdaily. Anemia - on ESA and s/p iron load.  Required transfusion this hosp.  ^ ESA dose. Hgb in the 8's for now-  likely to go down with surgery PAD with critical limb ischemia s/p rt BKA for gangrene - now will require LBKA s/p angiogram 1/20 showed no intervention possible so plans for amputation on 1/25 PAF - rate controlled and  Eliquis typically. On dilt and metop - cont even prior to dialysis.  Was having some RVR periodically during this admission and cardiology has been consulted - titrating BB and watching BP.  He's on midodrine now as well.  GERD COPD   9. Dispo: private transport to [place] HD unit from his [place] home too costly, will switch from [name] to [facility]- MWF-  has spot.   Now that he's having a 2nd amputation he will likely go to SNF and plans may need to change further depending on location of SNF.   Subjective:    Seen in HD-  no new c/o's-  says getting thoracentesis today ? And amputation tomorrow   BP (!) 116/35    Pulse (!) 109    Temp 97.9 F (36.6 C) (Tympanic)    Resp 16    Ht 5\' 9"   (1.753 m)    Wt 71 kg    SpO2 100%    BMI 23.11 kg/m   Intake/Output Summary (Last 24 hours) at 09/01/2021 0949 Last data filed at 09/01/2021 6578 Gross per 24 hour  Intake 721.66 ml  Output 0 ml  Net 721.66 ml   Weight change:   Physical Exam: GEN: NAD, A&Ox3, NCAT HEENT: MMM LUNGS: CTA B/L no rales, rhonchi or wheezing CV: irregular irregular HR low 100s, crescendo murmur ABD: SNDNT +BS  EXT:R BKA, L foot with purpura, blistering, partially wrapped ACCESS: Lt BBT--> distal portion is aneurysmal +t/b  Imaging: CT CHEST WO CONTRAST  Result Date: 08/31/2021 CLINICAL DATA:  Loculated LEFT pleural effusion EXAM: CT CHEST WITHOUT CONTRAST TECHNIQUE: Multidetector CT imaging of the chest was performed following the standard protocol without IV contrast. RADIATION DOSE REDUCTION: This exam was performed according to the departmental dose-optimization program which includes automated exposure control, adjustment of the mA and/or kV according to patient size and/or use of iterative reconstruction technique. COMPARISON:  July 29, 2021 and high-resolution chest CT from April of 2020. FINDINGS: Cardiovascular: Calcified atheromatous plaque of the thoracic aorta without aneurysmal dilation. Moderate cardiomegaly without substantial change. Small amount of pericardial fluid is diminished compared to previous  imaging. Three-vessel coronary artery disease, extensive coronary artery calcifications as before. Mitral annular calcifications. Central pulmonary vasculature 3.3 cm similar to previous imaging. Mediastinum/Nodes: 18 mm RIGHT paratracheal lymph node, previously 16 mm (image 70/4) Small pre-vascular lymph nodes less than a cm are stable, enlarged compared to more remote studies. Mildly enlarged paratracheal lymph nodes also with slight enlargement since recent imaging (image 60/4) 15 mm short axis, previously 12 mm. Similar slight enlargement of other lymph nodes throughout the RIGHT paratracheal  chain. Mild subcarinal nodal enlargement approximately 12 mm short axis is similar to prior imaging. No hilar adenopathy to the extent evaluated on noncontrast imaging. The esophagus is normal on limited assessment. No axillary adenopathy. Lungs/Pleura: Lenticular loculated RIGHT-sided pleural effusion along the lateral RIGHT chest (image 56/4) minimal fluid in this location before now measuring 7.2 x 2.9 cm greatest axial dimension, contiguous with loculated pleural fluid in the inferior RIGHT chest. Suspect mild underlying nodularity. Pleural fluid is circumferential in the RIGHT chest and increased in the medial RIGHT upper chest (image 36/4) 15 mm short axis, thickness of pleural fluid at the RIGHT lung apex. Other areas of loculated pleural fluid in the inferior RIGHT chest with similar appearance. Suspected rounded atelectasis amidst chronic pleural fluid in the RIGHT lower chest is similar. Loculated pleural fluid in the major fissure in the LEFT chest is diminished in size (image 77/4) 5.8 x 5.1 cm as compared to 10.0 x 5.2 cm. Chronic appearing volume loss at the LEFT lung base also similar. Interstitial thickening and ground-glass with nodularity has developed in the posterior RIGHT upper lobe since previous imaging and generalized interstitial thickening and ground-glass also present in the middle lobe as well as the LEFT lower lobe. Subcentimeter areas of nodularity amidst these changes. In addition a rounded atelectasis there is further increase in soft tissue that tracks towards the RIGHT hilum, this area measuring 4.5 x 4.0 cm (image 101/3) RIGHT of atelectasis peripheral to this is stable as outlined above. Upper Abdomen: Intra-abdominal ascites partially imaged. Aortic atherosclerosis tracks in the visceral branches in the upper abdomen, partially evaluated. Signs of marked renal atrophy not well assessed and incompletely imaged. Musculoskeletal: Signs of renal osteodystrophy. No acute or  destructive bone finding. IMPRESSION: 1. Patchy nodularity and ground-glass in the dependent RIGHT upper lobe and bilateral lower lobes raising the question of pneumonitis with distribution raising the question of sequela of aspiration. 2. More dense consolidative changes and irregular bronchial structures in the RIGHT lower lobe central to an area of rounded atelectasis is of uncertain significance. Given the more masslike appearance in this area and increasing size of mediastinal lymph nodes over time a possibility of neoplasm is considered. PET scan may be helpful on follow-up in addition to contrasted imaging with CT for further assessment. 3. Loculated and mildly nodular appearing pleural fluid in the RIGHT chest with increase and with decreased pleural fluid in the LEFT chest compared to prior imaging. This could also be further assessed with contrasted CT of the chest or PET as warranted. 4. Signs of end-stage renal disease. Aortic Atherosclerosis (ICD10-I70.0). Electronically Signed   By: Zetta Bills M.D.   On: 08/31/2021 19:33   DG CHEST PORT 1 VIEW  Result Date: 08/31/2021 CLINICAL DATA:  Hypoxemia. EXAM: PORTABLE CHEST 1 VIEW COMPARISON:  07/03/2021 and CT chest 07/29/2021. FINDINGS: Trachea is midline. Heart is enlarged. Loculated pleural effusions bilaterally with scattered airspace opacities, right greater than left. IMPRESSION: Moderate loculated bilateral pleural effusions with scattered associated volume loss.  Electronically Signed   By: Lorin Picket M.D.   On: 08/31/2021 10:26   ECHOCARDIOGRAM COMPLETE  Result Date: 08/31/2021    ECHOCARDIOGRAM REPORT   Patient Name:   [facility] Date of Exam: 08/31/2021 Medical Rec #:  [id number]    Height:       69.0 in Accession #:    [id number]   Weight:       155.0 lb Date of Birth:  03-18-1956    BSA:          1.854 m Patient Age:    47 years     BP:           112/81 mmHg Patient Gender: M            HR:           112 bpm. Exam Location:   Inpatient Procedure: 2D Echo Indications:    Murmur  History:        Patient has prior history of Echocardiogram examinations, most                 recent 04/09/2019.  Sonographer:    Arlyss Gandy Referring Phys: [id number] Crista Luria BHAGAT IMPRESSIONS  1. Abnormal septal motion . Left ventricular ejection fraction, by estimation, is 50 to 55%. The left ventricle has low normal function. The left ventricle has no regional wall motion abnormalities. Left ventricular diastolic parameters are indeterminate.  2. Right ventricular systolic function is moderately reduced. The right ventricular size is moderately enlarged. There is moderately elevated pulmonary artery systolic pressure.  3. Left atrial size was severely dilated.  4. Right atrial size was severely dilated.  5. Calcified subchordal apparatus. The mitral valve is abnormal. Mild mitral valve regurgitation. No evidence of mitral stenosis. Moderate mitral annular calcification.  6. The tricuspid valve is abnormal. Tricuspid valve regurgitation is severe.  7. The aortic valve is tricuspid. There is moderate calcification of the aortic valve. Aortic valve regurgitation is not visualized. Aortic valve sclerosis/calcification is present, without any evidence of aortic stenosis.  8. The inferior vena cava is dilated in size with <50% respiratory variability, suggesting right atrial pressure of 15 mmHg. FINDINGS  Left Ventricle: Abnormal septal motion. Left ventricular ejection fraction, by estimation, is 50 to 55%. The left ventricle has low normal function. The left ventricle has no regional wall motion abnormalities. The left ventricular internal cavity size was normal in size. There is no left ventricular hypertrophy. Left ventricular diastolic parameters are indeterminate. Right Ventricle: The right ventricular size is moderately enlarged. Right vetricular wall thickness was not assessed. Right ventricular systolic function is moderately reduced. There is  moderately elevated pulmonary artery systolic pressure. The tricuspid regurgitant velocity is 3.20 m/s, and with an assumed right atrial pressure of 15 mmHg, the estimated right ventricular systolic pressure is 75.1 mmHg. Left Atrium: Left atrial size was severely dilated. Right Atrium: Right atrial size was severely dilated. Pericardium: There is no evidence of pericardial effusion. Mitral Valve: Calcified subchordal apparatus. The mitral valve is abnormal. There is moderate thickening of the mitral valve leaflet(s). There is mild calcification of the mitral valve leaflet(s). Moderate mitral annular calcification. Mild mitral valve regurgitation. No evidence of mitral valve stenosis. Tricuspid Valve: The tricuspid valve is abnormal. Tricuspid valve regurgitation is severe. No evidence of tricuspid stenosis. Aortic Valve: The aortic valve is tricuspid. There is moderate calcification of the aortic valve. Aortic valve regurgitation is not visualized. Aortic regurgitation PHT measures 629 msec. Aortic valve sclerosis/calcification is present,  without any evidence of aortic stenosis. Aortic valve mean gradient measures 3.0 mmHg. Aortic valve peak gradient measures 5.8 mmHg. Aortic valve area, by VTI measures 2.21 cm. Pulmonic Valve: The pulmonic valve was normal in structure. Pulmonic valve regurgitation is mild. No evidence of pulmonic stenosis. Aorta: The aortic root is normal in size and structure. Venous: The inferior vena cava is dilated in size with less than 50% respiratory variability, suggesting right atrial pressure of 15 mmHg. IAS/Shunts: No atrial level shunt detected by color flow Doppler.  LEFT VENTRICLE PLAX 2D LVIDd:         4.80 cm   Diastology LVIDs:         3.80 cm   LV e' medial:    9.46 cm/s LV PW:         0.90 cm   LV E/e' medial:  11.3 LV IVS:        1.10 cm   LV e' lateral:   10.90 cm/s LVOT diam:     2.00 cm   LV E/e' lateral: 9.8 LV SV:         39 LV SV Index:   21 LVOT Area:     3.14 cm   RIGHT VENTRICLE            IVC RV Basal diam:  4.90 cm    IVC diam: 2.60 cm RV Mid diam:    3.80 cm RV S prime:     5.44 cm/s TAPSE (M-mode): 1.0 cm LEFT ATRIUM              Index        RIGHT ATRIUM           Index LA diam:        5.20 cm  2.81 cm/m   RA Area:     33.50 cm LA Vol (A2C):   119.5 ml 64.46 ml/m  RA Volume:   125.00 ml 67.43 ml/m LA Vol (A4C):   63.1 ml  34.04 ml/m LA Biplane Vol: 91.9 ml  49.57 ml/m  AORTIC VALVE AV Area (Vmax):    2.14 cm AV Area (Vmean):   1.93 cm AV Area (VTI):     2.21 cm AV Vmax:           120.00 cm/s AV Vmean:          78.300 cm/s AV VTI:            0.178 m AV Peak Grad:      5.8 mmHg AV Mean Grad:      3.0 mmHg LVOT Vmax:         81.60 cm/s LVOT Vmean:        48.000 cm/s LVOT VTI:          0.125 m LVOT/AV VTI ratio: 0.70 AI PHT:            629 msec  AORTA Ao Root diam: 3.30 cm MITRAL VALVE                TRICUSPID VALVE MV Area (PHT): 4.10 cm     TR Peak grad:   41.0 mmHg MV Decel Time: 185 msec     TR Vmax:        320.00 cm/s MV E velocity: 107.00 cm/s                             SHUNTS  Systemic VTI:  0.12 m                             Systemic Diam: 2.00 cm Jenkins Rouge MD Electronically signed by Jenkins Rouge MD Signature Date/Time: 08/31/2021/2:52:51 PM    Final     Labs: BMET Recent Labs  Lab 08/25/21 1639 08/26/21 1004 08/27/21 1830 08/29/21 1629 08/31/21 0145 09/01/21 0159  NA 126* 131* 128* 133* 131* 129*  K 4.2 3.6 4.1 3.7 3.9 3.9  CL 93* 95* 92* 98 94* 95*  CO2 24 27 23 24 24 24   GLUCOSE 112* 134* 104* 144* 96 88  BUN 56* 31* 49* 38* 30* 39*  CREATININE 7.80* 4.82* 6.45* 5.82* 5.01* 6.37*  CALCIUM 6.4* 7.3* 7.2* 7.3* 7.6* 7.7*  PHOS  --  2.7 3.5 3.2  --   --    CBC Recent Labs  Lab 08/26/21 1004 08/27/21 0515 08/28/21 0441 08/29/21 0451 08/30/21 0652 09/01/21 0159  WBC 7.5   < > 5.4 6.6 8.4 7.2  NEUTROABS 5.7  --   --   --   --   --   HGB 7.2*   < > 8.4* 8.0* 10.4* 8.3*  HCT 23.1*   < > 26.5* 26.0*  33.1* 26.4*  MCV 99.1   < > 95.3 96.7 97.6 96.0  PLT 276   < > 317 294 287 238   < > = values in this interval not displayed.    Medications:     atorvastatin  10 mg Oral QHS   cinacalcet  60 mg Oral Q supper   Darbepoetin Alfa  60 mcg Intravenous Q Tue-HD   diltiazem  120 mg Oral Daily   docusate sodium  100 mg Oral TID   gabapentin  100 mg Oral BID   hydrocortisone  25 mg Rectal BID   metoprolol tartrate  25 mg Oral BID   midodrine  10 mg Oral TID WC   pantoprazole  40 mg Oral Daily   polyethylene glycol  17 g Oral Daily   senna  2 tablet Oral QHS   sertraline  100 mg Oral Daily   sevelamer carbonate  2.4 g Oral TID WC    Sherline Eberwein A [facility] Pager [phone]

## 2021-09-02 ENCOUNTER — Inpatient Hospital Stay (HOSPITAL_COMMUNITY): Payer: Medicare Other | Admitting: Anesthesiology

## 2021-09-02 ENCOUNTER — Other Ambulatory Visit: Payer: Self-pay

## 2021-09-02 ENCOUNTER — Encounter (HOSPITAL_COMMUNITY)
Admission: RE | Disposition: A | Discharge status: Skilled Nursing Facility | Payer: Self-pay | Source: Intra-hospital | Attending: Internal Medicine

## 2021-09-02 DIAGNOSIS — I70262 Atherosclerosis of native arteries of extremities with gangrene, left leg: Secondary | ICD-10-CM

## 2021-09-02 HISTORY — PX: AMPUTATION: SHX166

## 2021-09-02 LAB — CBC
HCT: 26.5 % — ABNORMAL LOW (ref 39.0–52.0)
Hemoglobin: 8.3 g/dL — ABNORMAL LOW (ref 13.0–17.0)
MCH: 30.4 pg (ref 26.0–34.0)
MCHC: 31.3 g/dL (ref 30.0–36.0)
MCV: 97.1 fL (ref 80.0–100.0)
Platelets: 223 10*3/uL (ref 150–400)
RBC: 2.73 MIL/uL — ABNORMAL LOW (ref 4.22–5.81)
RDW: 17.4 % — ABNORMAL HIGH (ref 11.5–15.5)
WBC: 5.6 10*3/uL (ref 4.0–10.5)
nRBC: 0 % (ref 0.0–0.2)

## 2021-09-02 LAB — HEPARIN LEVEL (UNFRACTIONATED): Heparin Unfractionated: 0.51 IU/mL (ref 0.30–0.70)

## 2021-09-02 LAB — BASIC METABOLIC PANEL
Anion gap: 12 (ref 5–15)
BUN: 25 mg/dL — ABNORMAL HIGH (ref 8–23)
CO2: 25 mmol/L (ref 22–32)
Calcium: 8.1 mg/dL — ABNORMAL LOW (ref 8.9–10.3)
Chloride: 95 mmol/L — ABNORMAL LOW (ref 98–111)
Creatinine, Ser: 4.56 mg/dL — ABNORMAL HIGH (ref 0.61–1.24)
GFR, Estimated: 13 mL/min — ABNORMAL LOW (ref >60)
Glucose, Bld: 89 mg/dL (ref 70–99)
Potassium: 3.5 mmol/L (ref 3.5–5.1)
Sodium: 132 mmol/L — ABNORMAL LOW (ref 135–145)

## 2021-09-02 LAB — APTT: aPTT: 93 seconds — ABNORMAL HIGH (ref 24–36)

## 2021-09-02 SURGERY — AMPUTATION BELOW KNEE
Anesthesia: General | Site: Knee | Laterality: Left

## 2021-09-02 MED ORDER — SUGAMMADEX SODIUM 200 MG/2ML IV SOLN
INTRAVENOUS | Status: DC | PRN
Start: 2021-09-02 — End: 2021-09-02
  Administered 2021-09-02: 200 mg via INTRAVENOUS

## 2021-09-02 MED ORDER — SODIUM CHLORIDE 0.9 % IV SOLN: INTRAVENOUS | Status: DC

## 2021-09-02 MED ORDER — ORAL CARE MOUTH RINSE: 15.0000 mL | Freq: Once | OROMUCOSAL | Status: AC

## 2021-09-02 MED ORDER — FENTANYL CITRATE (PF) 250 MCG/5ML IJ SOLN
INTRAMUSCULAR | Status: AC
  Filled 2021-09-02: qty 5

## 2021-09-02 MED ORDER — MIDAZOLAM HCL 2 MG/2ML IJ SOLN
INTRAMUSCULAR | Status: AC
  Filled 2021-09-02: qty 2

## 2021-09-02 MED ORDER — ROCURONIUM BROMIDE 10 MG/ML (PF) SYRINGE
PREFILLED_SYRINGE | INTRAVENOUS | Status: DC | PRN
  Administered 2021-09-02: 50 mg via INTRAVENOUS

## 2021-09-02 MED ORDER — KETAMINE HCL 50 MG/5ML IJ SOSY
PREFILLED_SYRINGE | INTRAMUSCULAR | Status: AC
  Filled 2021-09-02: qty 5

## 2021-09-02 MED ORDER — MIDAZOLAM HCL 2 MG/2ML IJ SOLN
INTRAMUSCULAR | Status: DC | PRN
  Administered 2021-09-02: 1 mg via INTRAVENOUS

## 2021-09-02 MED ORDER — FENTANYL CITRATE (PF) 100 MCG/2ML IJ SOLN: 25.0000 ug | INTRAMUSCULAR | Status: DC | PRN

## 2021-09-02 MED ORDER — CHLORHEXIDINE GLUCONATE CLOTH 2 % EX PADS
6.0000 | MEDICATED_PAD | Freq: Every day | CUTANEOUS | Status: DC
  Administered 2021-09-03 – 2021-09-16 (×14): 6 via TOPICAL

## 2021-09-02 MED ORDER — LACTATED RINGERS IV SOLN: INTRAVENOUS | Status: DC

## 2021-09-02 MED ORDER — LIDOCAINE 2% (20 MG/ML) 5 ML SYRINGE
INTRAMUSCULAR | Status: AC
  Filled 2021-09-02: qty 5

## 2021-09-02 MED ORDER — CHLORHEXIDINE GLUCONATE 0.12 % MT SOLN
15.0000 mL | Freq: Once | OROMUCOSAL | Status: AC
  Administered 2021-09-02: 10:00:00 15 mL via OROMUCOSAL
  Filled 2021-09-02: qty 15

## 2021-09-02 MED ORDER — 0.9 % SODIUM CHLORIDE (POUR BTL) OPTIME
TOPICAL | Status: DC | PRN
  Administered 2021-09-02: 11:00:00 1000 mL

## 2021-09-02 MED ORDER — GLYCOPYRROLATE PF 0.2 MG/ML IJ SOSY
PREFILLED_SYRINGE | INTRAMUSCULAR | Status: DC | PRN
  Administered 2021-09-02: .2 mg via INTRAVENOUS

## 2021-09-02 MED ORDER — PHENYLEPHRINE 40 MCG/ML (10ML) SYRINGE FOR IV PUSH (FOR BLOOD PRESSURE SUPPORT)
PREFILLED_SYRINGE | INTRAVENOUS | Status: AC
  Filled 2021-09-02: qty 10

## 2021-09-02 MED ORDER — SODIUM CHLORIDE 0.9 % IV SOLN: INTRAVENOUS | Status: DC | PRN

## 2021-09-02 MED ORDER — FENTANYL CITRATE (PF) 250 MCG/5ML IJ SOLN
INTRAMUSCULAR | Status: DC | PRN
Start: 2021-09-02 — End: 2021-09-02
  Administered 2021-09-02: 100 ug via INTRAVENOUS
  Administered 2021-09-02 (×2): 50 ug via INTRAVENOUS

## 2021-09-02 MED ORDER — PROPOFOL 10 MG/ML IV BOLUS
INTRAVENOUS | Status: DC | PRN
  Administered 2021-09-02: 100 mg via INTRAVENOUS

## 2021-09-02 MED ORDER — SODIUM CHLORIDE FLUSH 0.9 % IV SOLN
INTRAVENOUS | Status: DC | PRN
  Administered 2021-09-02: 13:00:00 100 mL

## 2021-09-02 MED ORDER — ONDANSETRON HCL 4 MG/2ML IJ SOLN
INTRAMUSCULAR | Status: DC | PRN
Start: 2021-09-02 — End: 2021-09-02
  Administered 2021-09-02: 4 mg via INTRAVENOUS

## 2021-09-02 MED ORDER — GLYCOPYRROLATE PF 0.2 MG/ML IJ SOSY
PREFILLED_SYRINGE | INTRAMUSCULAR | Status: AC
  Filled 2021-09-02: qty 1

## 2021-09-02 MED ORDER — LIDOCAINE 2% (20 MG/ML) 5 ML SYRINGE
INTRAMUSCULAR | Status: DC | PRN
Start: 2021-09-02 — End: 2021-09-02
  Administered 2021-09-02: 100 mg via INTRAVENOUS

## 2021-09-02 MED ORDER — KETAMINE HCL 10 MG/ML IJ SOLN
INTRAMUSCULAR | Status: DC | PRN
Start: 2021-09-02 — End: 2021-09-02
  Administered 2021-09-02: 20 mg via INTRAVENOUS
  Administered 2021-09-02: 10 mg via INTRAVENOUS

## 2021-09-02 MED ORDER — PHENYLEPHRINE 40 MCG/ML (10ML) SYRINGE FOR IV PUSH (FOR BLOOD PRESSURE SUPPORT)
PREFILLED_SYRINGE | INTRAVENOUS | Status: DC | PRN
Start: 2021-09-02 — End: 2021-09-02
  Administered 2021-09-02 (×3): 80 ug via INTRAVENOUS
  Administered 2021-09-02: 40 ug via INTRAVENOUS
  Administered 2021-09-02: 80 ug via INTRAVENOUS

## 2021-09-02 MED ORDER — ONDANSETRON HCL 4 MG/2ML IJ SOLN
INTRAMUSCULAR | Status: AC
  Filled 2021-09-02: qty 2

## 2021-09-02 SURGICAL SUPPLY — 48 items
BAG COUNTER SPONGE SURGICOUNT (BAG) ×3 IMPLANT
BLADE SAW GIGLI 510 (BLADE) ×3 IMPLANT
BNDG COHESIVE 6X5 TAN STRL LF (GAUZE/BANDAGES/DRESSINGS) ×3 IMPLANT
BNDG ELASTIC 4X5.8 VLCR STR LF (GAUZE/BANDAGES/DRESSINGS) ×3 IMPLANT
BNDG ELASTIC 6X5.8 VLCR STR LF (GAUZE/BANDAGES/DRESSINGS) ×3 IMPLANT
BNDG GAUZE ELAST 4 BULKY (GAUZE/BANDAGES/DRESSINGS) ×2 IMPLANT
CANISTER SUCT 3000ML PPV (MISCELLANEOUS) ×3 IMPLANT
CLIP LIGATING EXTRA MED SLVR (CLIP) ×2 IMPLANT
CNTNR URN SCR LID CUP LEK RST (MISCELLANEOUS) ×1 IMPLANT
CONT SPEC 4OZ STRL OR WHT (MISCELLANEOUS) ×1
COVER SURGICAL LIGHT HANDLE (MISCELLANEOUS) ×3 IMPLANT
DRAPE HALF SHEET 40X57 (DRAPES) ×3 IMPLANT
DRAPE ORTHO SPLIT 77X108 STRL (DRAPES) ×2
DRAPE SURG ORHT 6 SPLT 77X108 (DRAPES) ×4 IMPLANT
DRSG ADAPTIC 3X8 NADH LF (GAUZE/BANDAGES/DRESSINGS) ×3 IMPLANT
ELECT REM PT RETURN 9FT ADLT (ELECTROSURGICAL) ×3
ELECTRODE REM PT RTRN 9FT ADLT (ELECTROSURGICAL) ×2 IMPLANT
GAUZE SPONGE 4X4 12PLY STRL (GAUZE/BANDAGES/DRESSINGS) ×3 IMPLANT
GLOVE SRG 8 PF TXTR STRL LF DI (GLOVE) ×2 IMPLANT
GLOVE SURG ENC MOIS LTX SZ7.5 (GLOVE) ×3 IMPLANT
GLOVE SURG POLYISO LF SZ7.5 (GLOVE) ×3 IMPLANT
GLOVE SURG UNDER POLY LF SZ8 (GLOVE) ×1
GOWN STRL REUS W/ TWL LRG LVL3 (GOWN DISPOSABLE) ×4 IMPLANT
GOWN STRL REUS W/ TWL XL LVL3 (GOWN DISPOSABLE) ×3 IMPLANT
GOWN STRL REUS W/TWL LRG LVL3 (GOWN DISPOSABLE) ×2
GOWN STRL REUS W/TWL XL LVL3 (GOWN DISPOSABLE) ×1
KIT BASIN OR (CUSTOM PROCEDURE TRAY) ×3 IMPLANT
KIT TURNOVER KIT B (KITS) ×3 IMPLANT
NS IRRIG 1000ML POUR BTL (IV SOLUTION) ×3 IMPLANT
PACK GENERAL/GYN (CUSTOM PROCEDURE TRAY) ×3 IMPLANT
PAD ARMBOARD 7.5X6 YLW CONV (MISCELLANEOUS) ×6 IMPLANT
SPONGE T-LAP 18X18 ~~LOC~~+RFID (SPONGE) ×6 IMPLANT
STAPLER VISISTAT 35W (STAPLE) ×3 IMPLANT
STOCKINETTE IMPERVIOUS LG (DRAPES) ×3 IMPLANT
SUT SILK 0 TIES 10X30 (SUTURE) ×3 IMPLANT
SUT SILK 2 0 (SUTURE) ×1
SUT SILK 2 0 SH CR/8 (SUTURE) ×1 IMPLANT
SUT SILK 2-0 18XBRD TIE 12 (SUTURE) ×1 IMPLANT
SUT SILK 3 0 (SUTURE) ×1
SUT SILK 3-0 18XBRD TIE 12 (SUTURE) ×1 IMPLANT
SUT VIC AB 0 CT1 18XCR BRD 8 (SUTURE) IMPLANT
SUT VIC AB 0 CT1 8-18 (SUTURE)
SUT VIC AB 2-0 CT1 18 (SUTURE) ×14 IMPLANT
SYR 20ML LL LF (SYRINGE) ×2 IMPLANT
TAPE UMBILICAL COTTON 1/8X30 (MISCELLANEOUS) IMPLANT
TOWEL GREEN STERILE (TOWEL DISPOSABLE) ×6 IMPLANT
UNDERPAD 30X36 HEAVY ABSORB (UNDERPADS AND DIAPERS) ×3 IMPLANT
WATER STERILE IRR 1000ML POUR (IV SOLUTION) ×3 IMPLANT

## 2021-09-02 NOTE — Progress Notes (Signed)
Away in OR for BKA.

## 2021-09-02 NOTE — Interval H&P Note (Signed)
History and Physical Interval Note:  09/02/2021 9:47 AM  [facility]  has presented today for surgery, with the diagnosis of Critical limb ischemia left lower extremity.  The various methods of treatment have been discussed with the patient and family. After consideration of risks, benefits and other options for treatment, the patient has consented to  Procedure(s): LEFT BELOW KNEE AMPUTATION (Left) APPLICATION OF INCISIONAL WOUND VAC (Left) as a surgical intervention.  The patient's history has been reviewed, patient examined, no change in status, stable for surgery.  I have reviewed the patient's chart and labs.  Questions were answered to the patient's satisfaction.     Annamarie Major

## 2021-09-02 NOTE — Progress Notes (Signed)
NAME:  JAKYREN FLUEGGE, MRN:  [id number], DOB:  1956-07-27, LOS: 3 ADMISSION DATE:  08/30/2021, CONSULTATION DATE:  08/31/21 REFERRING MD:  Dr Tyrell Antonio, CHIEF COMPLAINT: Abnormal chest x-ray  History of Present Illness:  66 yo M PMH ESRD, HTN, Afib on eliquis, COPD with chronic hypoxic respiratory failure on 2-3LNC, PAD/critical limb ischemia BLE s/p R BKA (08/07/21) during hospitalization at [facility] 12/28-1/4, who presented to [facility] from inpt rehab on 08/30/21 for L BKA. Admitted to [facility] and planned for L BKA 1/24. On 1/23 a CXR was obtained which revealed R>L pleural effusions. PCCM was consulted in this setting    In review of prior imaging and records: CT chest performed 07/29/21 which had increase in size of L sided loculated appearing pleural fluid, R sided loculated appearing pleural effusion, pleural thickening, R sided subpleural scarring, enlarged lymph nodes (9mmR paratracheal node, 70mm subcarinal, scattered throughout chest), small pericardial effusion.    Bilateral pleural effusions were noted on 2020 HRCT, at which time their appearance was felt c/w asbestosis   Pertinent  Medical History   Past Medical History:  Diagnosis Date   Barrett's esophagus    Chronic kidney disease    CHRONIC\   Glomerulosclerosis, focal    Hyperparathyroidism due to renal insufficiency (HCC)   End-stage renal disease on hemodialysis Chronic bilateral pleural effusion Chronic obstructive pulmonary disease Chronic hypoxemic respiratory failure Atrial fibrillation on Eliquis Critical limb ischemia S/p right BKA Hypertension Pericardial effusion   Significant Hospital Events: Including procedures, antibiotic start and stop dates in addition to other pertinent events   1/22 admitted from CIR with critical limb ischemia 1/23 PCCM consulted for bilateral effusions 1/24 tolerated dialysis well 1/25 plan for or today for BKA  Interim History / Subjective:  Denies any significant complaints  today, Breathing feels stable No chest pain or chest discomfort  Objective   Blood pressure 109/72, pulse (!) 111, temperature 98.4 F (36.9 C), temperature source Oral, resp. rate 18, height 5\' 9"  (1.753 m), weight 69.5 kg, SpO2 100 %.        Intake/Output Summary (Last 24 hours) at 09/02/2021 1962 Last data filed at 09/01/2021 1500 Gross per 24 hour  Intake 570.35 ml  Output 1500 ml  Net -929.65 ml   Filed Weights   09/01/21 0750 09/01/21 1133 09/02/21 0450  Weight: 71 kg 69.5 kg 69.5 kg    Examination: General: Chronically ill-appearing HENT: Moist oral mucosa Lungs: Decreased air movement bilaterally Cardiovascular: S1-S2 appreciated Abdomen: Soft, bowel sounds appreciated Extremities: Right BKA, cyanotic left foot Neuro: Alert and oriented, moving all extremities GU: Fair output  Echocardiogram with normal left ventricular function, reduced right ventricular systolic function, moderate pulmonary hypertension  Resolved Hospital Problem list     Assessment & Plan:    Loculated pleural effusion -Requested IR thoracentesis and fluid to be sent for analysis  Moderate pulmonary hypertension -Related to underlying lung disease and probably chronic hypoxemia -This may also be class V pulmonary hypertension from renal disease  Underlying concern for asbestos related lung disease, asbestos related pleural effusion based on previous CT scan findings -Patient stated he worked at [employer], worked at the [employer] but no known exposure to asbestos known to him, was not in the [employer]. Findings at the bases of the lung consistent with rounded atelectasis which would be consistent with asbestos exposure in the past  He has no fever, no leukocytosis, breathing appears stable -Do not believe there is any role for antibiotics at present  Chronic obstructive pulmonary disease -Continue bronchodilators  Critical limb ischemia -For BKA today  Atrial fibrillation -On  heparin at present, on Eliquis as outpatient  End-stage renal disease -Hemodialysis per nephrology  History of hypertension Barrett's esophagus-on PPI   Sherrilyn Rist, MD [place] P[facility]M Pager: See Shea Evans

## 2021-09-02 NOTE — Progress Notes (Signed)
ANTICOAGULATION CONSULT NOTE - Follow Up Consult  Pharmacy Consult for Apixaban >> Heparin Indication: atrial fibrillation  No Known Allergies  Patient Measurements: Height: 5\' 9"  (175.3 cm) Weight: 69.5 kg (153 lb 3.5 oz) IBW/kg (Calculated) : 70.7 Heparin Dosing Weight: 74.6 kg  Vital Signs: Temp: 98.4 F (36.9 C) (01/25 0751) Temp Source: Oral (01/25 0751) BP: 109/72 (01/25 0751) Pulse Rate: 111 (01/25 0751)  Labs: Recent Labs    08/31/21 0145 09/01/21 0159 09/02/21 0718  HGB  --  8.3* 8.3*  HCT  --  26.4* 26.5*  PLT  --  238 223  APTT 96* 73* 93*  HEPARINUNFRC 0.90* 0.60 0.51  CREATININE 5.01* 6.37* 4.56*     Estimated Creatinine Clearance: 15.7 mL/min (A) (by C-G formula based on SCr of 4.56 mg/dL (H)).   Assessment: 66 yo M recently had right BKA for PAD. He has been on apixaban for his AF.   He is s/p left angiogram on 1/20 with no options for revascularization and will need a left BKA 1/25.  -heparin level at goal   Goal of Therapy:  Heparin level 0.3-0.7 units/ml aPTT 66-102 seconds Monitor platelets by anticoagulation protocol: Yes   Plan:  -No heparin changes -Will follow plans post BKA  Hildred Laser, PharmD Clinical Pharmacist **Pharmacist phone directory can now be found on [email or web address] ([email or web address]).  Listed under [place].

## 2021-09-02 NOTE — Anesthesia Postprocedure Evaluation (Signed)
Anesthesia Post Note  Patient: Scott Castro  Procedure(s) Performed: LEFT BELOW KNEE AMPUTATION (Left: Knee)     Patient location during evaluation: PACU Anesthesia Type: General Level of consciousness: awake Pain management: pain level controlled Vital Signs Assessment: post-procedure vital signs reviewed and stable Respiratory status: spontaneous breathing Cardiovascular status: stable Postop Assessment: no apparent nausea or vomiting Anesthetic complications: no   No notable events documented.  Last Vitals:  Vitals:   09/02/21 1315 09/02/21 1330  BP: 124/80 124/74  Pulse: (!) 111 (!) 110  Resp: 13 20  Temp:  36.9 C  SpO2: 95% 93%    Last Pain:  Vitals:   09/02/21 1330  TempSrc:   PainSc: 0-No pain                 Bevelyn Arriola

## 2021-09-02 NOTE — Progress Notes (Signed)
Inpatient Rehabilitation Care Coordinator Discharge Note   Patient Details  Name: Scott Castro MRN: [id number] Date of Birth: 25-Jun-1956   Discharge location: TRANSFERRED TO ACUTE FOR MEDICAL ISSUES  Length of Stay:  18 DAYS  Discharge activity level: MIN LEVEL  Home/community participation: ACTIVE  Patient response HO:OILNZV Literacy - How often do you need to have someone help you when you read instructions, pamphlets, or other written material from your doctor or pharmacy?: Never  Patient response JK:QASUOR Isolation - How often do you feel lonely or isolated from those around you?: Sometimes  Services provided included: MD, RD, PT, OT, RN, CM, TR, Pharmacy, SW  Financial Services:  Financial Services Utilized: Medicare    Choices offered to/list presented to: PT  Follow-up services arranged:              Patient response to transportation need: Is the patient able to respond to transportation needs?: Yes In the past 12 months, has lack of transportation kept you from medical appointments or from getting medications?: Yes In the past 12 months, has lack of transportation kept you from meetings, work, or from getting things needed for daily living?: Yes    Comments (or additional information): PT'S RAMP HAS BEEN COMPLETED AND TRANSPORT HAS BEEN SET UP-[facility]IA [facility]. COUSIN TO STAY WITH FOR ONE WEEK AT [place]. [place] PLAN MAY CHANGE AFTER SURGERY AND LOSS OF HIS OTHER LEG.   Patient/Family verbalized understanding of follow-up arrangements:  Yes  Individual responsible for coordination of the follow-up plan: LORI-COUSIN [phone]  Confirmed correct DME delivered: Elease Hashimoto 09/02/2021    Daleiza Bacchi, [place]

## 2021-09-02 NOTE — Progress Notes (Signed)
PROGRESS NOTE    Scott Castro  JYN:[id number] DOB: [birth year] DOA: 08/30/2021 PCP: Franciso Bend, MD   Brief Narrative: 66 year old past medical history significant for ESRD on dialysis TTS, hypertension, hyperlipidemia, PAF, depression, chronic respiratory failure 2 L of oxygen, PAD recent right BKA on 08/07/2021 secondary to critical limb ischemia discharged to rehab.  He was seen by cardiology in inpatient rehab due to A. fib RVR and he was referred for inpatient telemetry admission.  Recent hospitalized at [facility] from 12/28 until 1//2023 right lower extremity critical limb ischemia status post BKA by vascular on 12/30.  Was just discharged to inpatient rehab here at [facility].  Found to be in RVR RVR admitted for further evaluation.  Patient  also had angiography 1/20 by Dr. [name] with BL diffuse infrapopliteal disease of the left lower extremity, vascular is recommending left BKA early this week.  Patient noted to be more hypoxic on 1/23, chest x-ray showed bilateral loculated pleural effusion, pulmonologist consulted for further evaluation. CT chest showed right loculated pleural effusion on right. Right lower lobe with irregular consolidation right lower lobe, he will need PET scan out patient.   Assessment & Plan:   Principal Problem:   Critical limb ischemia of left lower extremity/PAD  Active Problems:   COPD (chronic obstructive pulmonary disease) (HCC)   Chronic respiratory failure with hypoxia (HCC)   ESRD (end stage renal disease) (HCC)   Barrett esophagus   Anemia   Mixed hyperlipidemia   S/P BKA (below knee amputation), right (HCC)   Depressive reaction   AF (paroxysmal atrial fibrillation) (HCC)  1-Critical Limb ischemia of left lower extremity/PAD -Angiography 1/20 by Dr. Luan Pulling revealed diffuse infrapopliteal disease of the left with no revascularization options -Underwent  left BKA 1/25th -heparin on hold for surgery.  -Recent BKA on the right 12/22 Resume  heparin when ok by Vascular.  Tolerated procedure well.   2-A. Fib Heparin on hold due to SX Continue with  metoprolol and Cardizem.  Appreciate cardiology assistance.  He didn't received Cardizem/metoprolol yesterday due to Soft BP.  Per vascular plan to resume heparin 1/26.  3-Acute on chronic hypoxic respiratory failure Loculated bilateral moderate pleural effusion Moderate Pulmonary HTN;  COPD -Pulmonologist consulted -Incentive Spirometry ordered -Continue with nebulizer as needed -CT chest 1/23: Patchy nodularity and ground-glass in the dependent RIGHT upper lobe and bilateral lower lobes raising the question of pneumonitis with distribution raising the question of sequela of aspiration. More dense consolidative changes and irregular bronchial structures in the RIGHT lower lobe central to an area of rounded atelectasis is of uncertain significance. Given the more mass like appearance in this area and increasing size of mediastinal lymph nodes over time a possibility of neoplasm is considered. PET scan may be helpful on follow-up in addition to contrasted imaging with CT for further assessment.  Loculated and mildly nodular appearing pleural fluid in the RIGHT chest with increase and with decreased pleural fluid in the LEFT chest compared to prior imaging.  -IR consulted for thoracentesis.  Stable on 2 L oxygen.   4-ESRD on hemodialysis -S/p renal transplant [place] with hemodialysis -Nephrology  following -had HD  1/24.  Barrett esophagus: Continue with PPI  Anemia:  anemia of chronic disease: IV iron per nephrology  Hyperlipidemia: Continue with statins  Depression: Continue with Zoloft  Hyponatremia; fluid management with HD>    Severe tricuspid regurgitation, mild mitral regurgitation. Management per cardiology.      Estimated body mass index  is 22.63 kg/m as calculated from the following:   Height as of this encounter: 5\' 9"  (1.753 m).   Weight as of  this encounter: 69.5 kg.   DVT prophylaxis: Heparin Gtt Code Status: Full code Family Communication: care discussed with patient  Disposition Plan:  Status is: Inpatient  Remains inpatient appropriate because: management of critical limb ischemia. Resp failure        Consultants:  Nephrology Cardiology Vascular.  Pulmonologist   Procedures:    Antimicrobials:    Subjective: He is eating now, had surgery. Report pain is controlled.     Objective: Vitals:   09/02/21 1315 09/02/21 1330 09/02/21 1347 09/02/21 1413  BP: 124/80 124/74 (!) 130/92 137/89  Pulse: (!) 111 (!) 110 (!) 111 (!) 112  Resp: 13 20 18 16   Temp:  98.4 F (36.9 C)  97.9 F (36.6 C)  TempSrc:    Oral  SpO2: 95% 93% 99% 99%  Weight:      Height:        Intake/Output Summary (Last 24 hours) at 09/02/2021 1442 Last data filed at 09/02/2021 1300 Gross per 24 hour  Intake 908.68 ml  Output 100 ml  Net 808.68 ml    Filed Weights   09/01/21 1133 09/02/21 0450 09/02/21 0957  Weight: 69.5 kg 69.5 kg 69.5 kg    Examination:  General exam: Chronic ill appearing Respiratory system: BL crackles.  Cardiovascular system: S 1, S 2 IRR Systolic murmur Gastrointestinal system: BS present, soft, nt Central nervous system: Alert Extremities: R BKA , left SP BKA     Data Reviewed: I have personally reviewed following labs and imaging studies  CBC: Recent Labs  Lab 08/28/21 0441 08/29/21 0451 08/30/21 0652 09/01/21 0159 09/02/21 0718  WBC 5.4 6.6 8.4 7.2 5.6  HGB 8.4* 8.0* 10.4* 8.3* 8.3*  HCT 26.5* 26.0* 33.1* 26.4* 26.5*  MCV 95.3 96.7 97.6 96.0 97.1  PLT 317 294 287 238 696    Basic Metabolic Panel: Recent Labs  Lab 08/27/21 1830 08/29/21 1629 08/31/21 0145 09/01/21 0159 09/02/21 0718  NA 128* 133* 131* 129* 132*  K 4.1 3.7 3.9 3.9 3.5  CL 92* 98 94* 95* 95*  CO2 23 24 24 24 25   GLUCOSE 104* 144* 96 88 89  BUN 49* 38* 30* 39* 25*  CREATININE 6.45* 5.82* 5.01* 6.37* 4.56*   CALCIUM 7.2* 7.3* 7.6* 7.7* 8.1*  PHOS 3.5 3.2  --   --   --     GFR: Estimated Creatinine Clearance: 15.7 mL/min (A) (by C-G formula based on SCr of 4.56 mg/dL (H)). Liver Function Tests: Recent Labs  Lab 08/27/21 1830 08/29/21 1629  ALBUMIN 1.8* 1.9*    No results for input(s): LIPASE, AMYLASE in the last 168 hours. No results for input(s): AMMONIA in the last 168 hours. Coagulation Profile: No results for input(s): INR, PROTIME in the last 168 hours. Cardiac Enzymes: No results for input(s): CKTOTAL, CKMB, CKMBINDEX, TROPONINI in the last 168 hours. BNP (last 3 results) No results for input(s): PROBNP in the last 8760 hours. HbA1C: No results for input(s): HGBA1C in the last 72 hours. CBG: No results for input(s): GLUCAP in the last 168 hours.  Lipid Profile: No results for input(s): CHOL, HDL, LDLCALC, TRIG, CHOLHDL, LDLDIRECT in the last 72 hours.  Thyroid Function Tests: No results for input(s): TSH, T4TOTAL, FREET4, T3FREE, THYROIDAB in the last 72 hours. Anemia Panel: No results for input(s): VITAMINB12, FOLATE, FERRITIN, TIBC, IRON, RETICCTPCT in the last 72  hours. Sepsis Labs: No results for input(s): PROCALCITON, LATICACIDVEN in the last 168 hours.  No results found for this or any previous visit (from the past 240 hour(s)).       Radiology Studies: CT CHEST WO CONTRAST  Result Date: 08/31/2021 CLINICAL DATA:  Loculated LEFT pleural effusion EXAM: CT CHEST WITHOUT CONTRAST TECHNIQUE: Multidetector CT imaging of the chest was performed following the standard protocol without IV contrast. RADIATION DOSE REDUCTION: This exam was performed according to the departmental dose-optimization program which includes automated exposure control, adjustment of the mA and/or kV according to patient size and/or use of iterative reconstruction technique. COMPARISON:  July 29, 2021 and high-resolution chest CT from April of 2020. FINDINGS: Cardiovascular: Calcified  atheromatous plaque of the thoracic aorta without aneurysmal dilation. Moderate cardiomegaly without substantial change. Small amount of pericardial fluid is diminished compared to previous imaging. Three-vessel coronary artery disease, extensive coronary artery calcifications as before. Mitral annular calcifications. Central pulmonary vasculature 3.3 cm similar to previous imaging. Mediastinum/Nodes: 18 mm RIGHT paratracheal lymph node, previously 16 mm (image 70/4) Small pre-vascular lymph nodes less than a cm are stable, enlarged compared to more remote studies. Mildly enlarged paratracheal lymph nodes also with slight enlargement since recent imaging (image 60/4) 15 mm short axis, previously 12 mm. Similar slight enlargement of other lymph nodes throughout the RIGHT paratracheal chain. Mild subcarinal nodal enlargement approximately 12 mm short axis is similar to prior imaging. No hilar adenopathy to the extent evaluated on noncontrast imaging. The esophagus is normal on limited assessment. No axillary adenopathy. Lungs/Pleura: Lenticular loculated RIGHT-sided pleural effusion along the lateral RIGHT chest (image 56/4) minimal fluid in this location before now measuring 7.2 x 2.9 cm greatest axial dimension, contiguous with loculated pleural fluid in the inferior RIGHT chest. Suspect mild underlying nodularity. Pleural fluid is circumferential in the RIGHT chest and increased in the medial RIGHT upper chest (image 36/4) 15 mm short axis, thickness of pleural fluid at the RIGHT lung apex. Other areas of loculated pleural fluid in the inferior RIGHT chest with similar appearance. Suspected rounded atelectasis amidst chronic pleural fluid in the RIGHT lower chest is similar. Loculated pleural fluid in the major fissure in the LEFT chest is diminished in size (image 77/4) 5.8 x 5.1 cm as compared to 10.0 x 5.2 cm. Chronic appearing volume loss at the LEFT lung base also similar. Interstitial thickening and  ground-glass with nodularity has developed in the posterior RIGHT upper lobe since previous imaging and generalized interstitial thickening and ground-glass also present in the middle lobe as well as the LEFT lower lobe. Subcentimeter areas of nodularity amidst these changes. In addition a rounded atelectasis there is further increase in soft tissue that tracks towards the RIGHT hilum, this area measuring 4.5 x 4.0 cm (image 101/3) RIGHT of atelectasis peripheral to this is stable as outlined above. Upper Abdomen: Intra-abdominal ascites partially imaged. Aortic atherosclerosis tracks in the visceral branches in the upper abdomen, partially evaluated. Signs of marked renal atrophy not well assessed and incompletely imaged. Musculoskeletal: Signs of renal osteodystrophy. No acute or destructive bone finding. IMPRESSION: 1. Patchy nodularity and ground-glass in the dependent RIGHT upper lobe and bilateral lower lobes raising the question of pneumonitis with distribution raising the question of sequela of aspiration. 2. More dense consolidative changes and irregular bronchial structures in the RIGHT lower lobe central to an area of rounded atelectasis is of uncertain significance. Given the more masslike appearance in this area and increasing size of mediastinal lymph nodes over  time a possibility of neoplasm is considered. PET scan may be helpful on follow-up in addition to contrasted imaging with CT for further assessment. 3. Loculated and mildly nodular appearing pleural fluid in the RIGHT chest with increase and with decreased pleural fluid in the LEFT chest compared to prior imaging. This could also be further assessed with contrasted CT of the chest or PET as warranted. 4. Signs of end-stage renal disease. Aortic Atherosclerosis (ICD10-I70.0). Electronically Signed   By: Zetta Bills M.D.   On: 08/31/2021 19:33        Scheduled Meds:  atorvastatin  10 mg Oral QHS   cinacalcet  60 mg Oral Q supper    Darbepoetin Alfa  100 mcg Intravenous Q Tue-HD   diltiazem  120 mg Oral Daily   docusate sodium  100 mg Oral TID   gabapentin  100 mg Oral BID   hydrocortisone  25 mg Rectal BID   metoprolol tartrate  37.5 mg Oral BID   midodrine  10 mg Oral TID WC   pantoprazole  40 mg Oral Daily   polyethylene glycol  17 g Oral Daily   senna  2 tablet Oral QHS   sertraline  100 mg Oral Daily   sevelamer carbonate  2.4 g Oral TID WC   Continuous Infusions:  sodium chloride     ferric gluconate (FERRLECIT) IVPB Stopped (09/01/21 1126)     LOS: 3 days    Time spent: 35 Minutes.     Elmarie Shiley, MD [facility]   If 7PM-7AM, please contact night-coverage [email or web address]  09/02/2021, 2:42 PM

## 2021-09-02 NOTE — Anesthesia Procedure Notes (Signed)
Procedure Name: Intubation Date/Time: 09/02/2021 10:36 AM Performed by: Cathren Harsh, CRNA Pre-anesthesia Checklist: Patient identified, Emergency Drugs available, Suction available and Patient being monitored Patient Re-evaluated:Patient Re-evaluated prior to induction Oxygen Delivery Method: Circle System Utilized Preoxygenation: Pre-oxygenation with 100% oxygen Induction Type: IV induction Ventilation: Mask ventilation without difficulty Laryngoscope Size: Mac and 3 Grade View: Grade II Tube type: Oral Tube size: 7.5 mm Number of attempts: 1 Airway Equipment and Method: Stylet and Oral airway Placement Confirmation: ETT inserted through vocal cords under direct vision, positive ETCO2 and breath sounds checked- equal and bilateral Secured at: 23 cm Tube secured with: Tape Dental Injury: Teeth and Oropharynx as per pre-operative assessment

## 2021-09-02 NOTE — Op Note (Signed)
° ° °  Patient name: Scott Castro MRN: [id number] DOB: 14-Jan-1956 Sex: male  09/02/2021 Pre-operative Diagnosis: Ischemic left leg Post-operative diagnosis:  Same Surgeon:  Annamarie Major Assistants:  Sabino Dick, MS III Procedure:   Left below-knee amputation Anesthesia:  General Blood Loss:  100 cc Specimens:  left leg  Findings:  good capillary bleeding at amputation site  Indications: This is a 66 year old gentleman with ischemic changes to his left foot who comes in today for below-knee amputation.  Procedure:  The patient was identified in the holding area and taken to [place] [facility]  The patient was then placed supine on the table. general anesthesia was administered.  The patient was prepped and draped in the usual sterile fashion.  A time out was called and antibiotics were administered.  A circumferential measurement was taken 13 cm below the tibial tuberosity.  This was used to create a one third two thirds posterior flap.  A #10 blade was used to create the incision which was carried down to the fascia.  I then circumferentially dissected out the tibia as well as the fibula.  Periosteal elevator was used to elevate the periosteum.  I then dissected out the neurovascular bundle and ligated this between silk ties.  A Gigli saw was then used to transect the tibia, beveling the anterior surface.  Large bone cutters were then used to transect the fibula, proximal to the cut edge of the tibia.  An amputation knife was then used to divide the remaining muscle.  The leg was removed and sent as a specimen.  I then dissected out the neurovascular bundle.  The nerve was ligated proximal to the cut edge of the tibia as were the vessels.  A rasp was used to smooth the bone surface areas.  The wound was then irrigated.  Hemostasis was achieved.  Exparel was used for local anesthesia.  Next, the fascia was reapproximated with interrupted 2-0 Vicryl sutures.  The skin was closed with staples.  Sterile  dressings were applied.  There were no immediate complications.   Disposition: To PACU stable.   Theotis Burrow, M.D., [facility] [place] Office: [phone] Pager:  [phone]

## 2021-09-02 NOTE — Progress Notes (Signed)
This chaplain attempted F/U spiritual care and the sharing of information about opportunities for community involvement.   The chaplain was updated by the Pt. RN-Jordan.  The chaplain understands the Pt. is out of the room for BKA at the time of the visit.  The chaplain will plan on a post surgery F/U.  Chaplain Sallyanne Kuster [phone]

## 2021-09-02 NOTE — Progress Notes (Signed)
[name] Progress Note   66 y.o. male PMH COPD Barrett's, hyperparathyroidism, ESRD, failed renal transplant, PAF on Eliquis p/w ischemic foot ulcer -> BKA on 12/30.  Patient receives his dialysis treatments at [place] with [name].   Dialysis at [name] with [name] EDW 68.4kg? EDW before the amputation was 72kg     Assessment/ Plan:   ESRD - TTS at [name] w/ [name]  - HD set up for MWF outpt now, though this may further change, see below. Due to staffing-  his HD got put off to Tuesday now plan is for amputation today - so will keep on TTS schedule for now -  next HD tomorrow   -  Uses elisio dialyzer at [place] but has been tolerating our standard optiflux dialyzer here  [name]- [name] 2.4 grams packet TIDM and 1600 with snacks-  will decrease snack dose given phos of 3 . [name] 60mg  qdaily as well. Anemia - on ESA and s/p iron load.  Required transfusion this hosp.  ^ ESA dose. Hgb in the 8's for now-  likely to go down with surgery PAD with critical limb ischemia s/p rt BKA for gangrene - now will require LBKA s/p angiogram 1/20 showed no intervention possible- scheduled for today  PAF - rate controlled and  Eliquis typically. On dilt and metop - cont even prior to dialysis.  Was having some RVR periodically during this admission and cardiology has been consulted - titrating BB and watching BP.  He's on midodrine now as well.  GERD COPD   9. Dispo: [name] too costly, will switch from [name] to [name]- MWF-  has spot.   Now that he's having a 2nd amputation he will likely go to SNF and plans may need to change further depending on location of SNF.   Subjective:    Seen in HD-  no new c/o's-   thoracentesis has not been done yet-  amputation planned for today   BP 109/72 (BP Location: Right Wrist)    Pulse (!) 111    Temp 98.4 F (36.9 C) (Oral)     Resp 18    Ht 5\' 9"  (1.753 m)    Wt 69.5 kg    SpO2 100%    BMI 22.63 kg/m   Intake/Output Summary (Last 24 hours) at 09/02/2021 0931 Last data filed at 09/01/2021 1500 Gross per 24 hour  Intake 570.35 ml  Output 1500 ml  Net -929.65 ml   Weight change: 0.7 kg  Physical Exam: GEN: NAD, A&Ox3, NCAT HEENT: MMM LUNGS: CTA B/L no rales, rhonchi or wheezing CV: irregular irregular HR low 100s, crescendo murmur ABD: SNDNT +BS  EXT:R BKA, L foot with purpura, blistering, partially wrapped ACCESS: Lt BBT--> distal portion is aneurysmal +t/b  Imaging: CT CHEST WO CONTRAST  Result Date: 08/31/2021 CLINICAL DATA:  Loculated LEFT pleural effusion EXAM: CT CHEST WITHOUT CONTRAST TECHNIQUE: Multidetector CT imaging of the chest was performed following the standard protocol without IV contrast. RADIATION DOSE REDUCTION: This exam was performed according to the departmental dose-optimization program which includes automated exposure control, adjustment of the mA and/or kV according to patient size and/or use of iterative reconstruction technique. COMPARISON:  July 29, 2021 and high-resolution chest CT from April of 2020. FINDINGS: Cardiovascular: Calcified atheromatous plaque of the thoracic aorta without aneurysmal dilation. Moderate cardiomegaly without substantial change. Small amount of pericardial  fluid is diminished compared to previous imaging. Three-vessel coronary artery disease, extensive coronary artery calcifications as before. Mitral annular calcifications. Central pulmonary vasculature 3.3 cm similar to previous imaging. Mediastinum/Nodes: 18 mm RIGHT paratracheal lymph node, previously 16 mm (image 70/4) Small pre-vascular lymph nodes less than a cm are stable, enlarged compared to more remote studies. Mildly enlarged paratracheal lymph nodes also with slight enlargement since recent imaging (image 60/4) 15 mm short axis, previously 12 mm. Similar slight enlargement of other lymph nodes  throughout the RIGHT paratracheal chain. Mild subcarinal nodal enlargement approximately 12 mm short axis is similar to prior imaging. No hilar adenopathy to the extent evaluated on noncontrast imaging. The esophagus is normal on limited assessment. No axillary adenopathy. Lungs/Pleura: Lenticular loculated RIGHT-sided pleural effusion along the lateral RIGHT chest (image 56/4) minimal fluid in this location before now measuring 7.2 x 2.9 cm greatest axial dimension, contiguous with loculated pleural fluid in the inferior RIGHT chest. Suspect mild underlying nodularity. Pleural fluid is circumferential in the RIGHT chest and increased in the medial RIGHT upper chest (image 36/4) 15 mm short axis, thickness of pleural fluid at the RIGHT lung apex. Other areas of loculated pleural fluid in the inferior RIGHT chest with similar appearance. Suspected rounded atelectasis amidst chronic pleural fluid in the RIGHT lower chest is similar. Loculated pleural fluid in the major fissure in the LEFT chest is diminished in size (image 77/4) 5.8 x 5.1 cm as compared to 10.0 x 5.2 cm. Chronic appearing volume loss at the LEFT lung base also similar. Interstitial thickening and ground-glass with nodularity has developed in the posterior RIGHT upper lobe since previous imaging and generalized interstitial thickening and ground-glass also present in the middle lobe as well as the LEFT lower lobe. Subcentimeter areas of nodularity amidst these changes. In addition a rounded atelectasis there is further increase in soft tissue that tracks towards the RIGHT hilum, this area measuring 4.5 x 4.0 cm (image 101/3) RIGHT of atelectasis peripheral to this is stable as outlined above. Upper Abdomen: Intra-abdominal ascites partially imaged. Aortic atherosclerosis tracks in the visceral branches in the upper abdomen, partially evaluated. Signs of marked renal atrophy not well assessed and incompletely imaged. Musculoskeletal: Signs of renal  osteodystrophy. No acute or destructive bone finding. IMPRESSION: 1. Patchy nodularity and ground-glass in the dependent RIGHT upper lobe and bilateral lower lobes raising the question of pneumonitis with distribution raising the question of sequela of aspiration. 2. More dense consolidative changes and irregular bronchial structures in the RIGHT lower lobe central to an area of rounded atelectasis is of uncertain significance. Given the more masslike appearance in this area and increasing size of mediastinal lymph nodes over time a possibility of neoplasm is considered. PET scan may be helpful on follow-up in addition to contrasted imaging with CT for further assessment. 3. Loculated and mildly nodular appearing pleural fluid in the RIGHT chest with increase and with decreased pleural fluid in the LEFT chest compared to prior imaging. This could also be further assessed with contrasted CT of the chest or PET as warranted. 4. Signs of end-stage renal disease. Aortic Atherosclerosis (ICD10-I70.0). Electronically Signed   By: Zetta Bills M.D.   On: 08/31/2021 19:33   DG CHEST PORT 1 VIEW  Result Date: 08/31/2021 CLINICAL DATA:  Hypoxemia. EXAM: PORTABLE CHEST 1 VIEW COMPARISON:  07/03/2021 and CT chest 07/29/2021. FINDINGS: Trachea is midline. Heart is enlarged. Loculated pleural effusions bilaterally with scattered airspace opacities, right greater than left. IMPRESSION: Moderate loculated bilateral pleural  effusions with scattered associated volume loss. Electronically Signed   By: Lorin Picket M.D.   On: 08/31/2021 10:26   ECHOCARDIOGRAM COMPLETE  Result Date: 08/31/2021    ECHOCARDIOGRAM REPORT   Patient Name:   Scott Castro Date of Exam: 08/31/2021 Medical Rec #:  [id number]    Height:       69.0 in Accession #:    [id number]   Weight:       155.0 lb Date of Birth:  Dec 22, 1955    BSA:          1.854 m Patient Age:    58 years     BP:           112/81 mmHg Patient Gender: M            HR:           112  bpm. Exam Location:  Inpatient Procedure: 2D Echo Indications:    Murmur  History:        Patient has prior history of Echocardiogram examinations, most                 recent 04/09/2019.  Sonographer:    Arlyss Gandy Referring Phys: [id number] Crista Luria BHAGAT IMPRESSIONS  1. Abnormal septal motion . Left ventricular ejection fraction, by estimation, is 50 to 55%. The left ventricle has low normal function. The left ventricle has no regional wall motion abnormalities. Left ventricular diastolic parameters are indeterminate.  2. Right ventricular systolic function is moderately reduced. The right ventricular size is moderately enlarged. There is moderately elevated pulmonary artery systolic pressure.  3. Left atrial size was severely dilated.  4. Right atrial size was severely dilated.  5. Calcified subchordal apparatus. The mitral valve is abnormal. Mild mitral valve regurgitation. No evidence of mitral stenosis. Moderate mitral annular calcification.  6. The tricuspid valve is abnormal. Tricuspid valve regurgitation is severe.  7. The aortic valve is tricuspid. There is moderate calcification of the aortic valve. Aortic valve regurgitation is not visualized. Aortic valve sclerosis/calcification is present, without any evidence of aortic stenosis.  8. The inferior vena cava is dilated in size with <50% respiratory variability, suggesting right atrial pressure of 15 mmHg. FINDINGS  Left Ventricle: Abnormal septal motion. Left ventricular ejection fraction, by estimation, is 50 to 55%. The left ventricle has low normal function. The left ventricle has no regional wall motion abnormalities. The left ventricular internal cavity size was normal in size. There is no left ventricular hypertrophy. Left ventricular diastolic parameters are indeterminate. Right Ventricle: The right ventricular size is moderately enlarged. Right vetricular wall thickness was not assessed. Right ventricular systolic function is moderately  reduced. There is moderately elevated pulmonary artery systolic pressure. The tricuspid regurgitant velocity is 3.20 m/s, and with an assumed right atrial pressure of 15 mmHg, the estimated right ventricular systolic pressure is 78.5 mmHg. Left Atrium: Left atrial size was severely dilated. Right Atrium: Right atrial size was severely dilated. Pericardium: There is no evidence of pericardial effusion. Mitral Valve: Calcified subchordal apparatus. The mitral valve is abnormal. There is moderate thickening of the mitral valve leaflet(s). There is mild calcification of the mitral valve leaflet(s). Moderate mitral annular calcification. Mild mitral valve regurgitation. No evidence of mitral valve stenosis. Tricuspid Valve: The tricuspid valve is abnormal. Tricuspid valve regurgitation is severe. No evidence of tricuspid stenosis. Aortic Valve: The aortic valve is tricuspid. There is moderate calcification of the aortic valve. Aortic valve regurgitation is not visualized. Aortic regurgitation PHT measures 629  msec. Aortic valve sclerosis/calcification is present, without any evidence of aortic stenosis. Aortic valve mean gradient measures 3.0 mmHg. Aortic valve peak gradient measures 5.8 mmHg. Aortic valve area, by VTI measures 2.21 cm. Pulmonic Valve: The pulmonic valve was normal in structure. Pulmonic valve regurgitation is mild. No evidence of pulmonic stenosis. Aorta: The aortic root is normal in size and structure. Venous: The inferior vena cava is dilated in size with less than 50% respiratory variability, suggesting right atrial pressure of 15 mmHg. IAS/Shunts: No atrial level shunt detected by color flow Doppler.  LEFT VENTRICLE PLAX 2D LVIDd:         4.80 cm   Diastology LVIDs:         3.80 cm   LV e' medial:    9.46 cm/s LV PW:         0.90 cm   LV E/e' medial:  11.3 LV IVS:        1.10 cm   LV e' lateral:   10.90 cm/s LVOT diam:     2.00 cm   LV E/e' lateral: 9.8 LV SV:         39 LV SV Index:   21 LVOT  Area:     3.14 cm  RIGHT VENTRICLE            IVC RV Basal diam:  4.90 cm    IVC diam: 2.60 cm RV Mid diam:    3.80 cm RV S prime:     5.44 cm/s TAPSE (M-mode): 1.0 cm LEFT ATRIUM              Index        RIGHT ATRIUM           Index LA diam:        5.20 cm  2.81 cm/m   RA Area:     33.50 cm LA Vol (A2C):   119.5 ml 64.46 ml/m  RA Volume:   125.00 ml 67.43 ml/m LA Vol (A4C):   63.1 ml  34.04 ml/m LA Biplane Vol: 91.9 ml  49.57 ml/m  AORTIC VALVE AV Area (Vmax):    2.14 cm AV Area (Vmean):   1.93 cm AV Area (VTI):     2.21 cm AV Vmax:           120.00 cm/s AV Vmean:          78.300 cm/s AV VTI:            0.178 m AV Peak Grad:      5.8 mmHg AV Mean Grad:      3.0 mmHg LVOT Vmax:         81.60 cm/s LVOT Vmean:        48.000 cm/s LVOT VTI:          0.125 m LVOT/AV VTI ratio: 0.70 AI PHT:            629 msec  AORTA Ao Root diam: 3.30 cm MITRAL VALVE                TRICUSPID VALVE MV Area (PHT): 4.10 cm     TR Peak grad:   41.0 mmHg MV Decel Time: 185 msec     TR Vmax:        320.00 cm/s MV E velocity: 107.00 cm/s                             SHUNTS  Systemic VTI:  0.12 m                             Systemic Diam: 2.00 cm Jenkins Rouge MD Electronically signed by Jenkins Rouge MD Signature Date/Time: 08/31/2021/2:52:51 PM    Final     Labs: BMET Recent Labs  Lab 08/26/21 1004 08/27/21 1830 08/29/21 1629 08/31/21 0145 09/01/21 0159 09/02/21 0718  NA 131* 128* 133* 131* 129* 132*  K 3.6 4.1 3.7 3.9 3.9 3.5  CL 95* 92* 98 94* 95* 95*  CO2 27 23 24 24 24 25   GLUCOSE 134* 104* 144* 96 88 89  BUN 31* 49* 38* 30* 39* 25*  CREATININE 4.82* 6.45* 5.82* 5.01* 6.37* 4.56*  CALCIUM 7.3* 7.2* 7.3* 7.6* 7.7* 8.1*  PHOS 2.7 3.5 3.2  --   --   --    CBC Recent Labs  Lab 08/26/21 1004 08/27/21 0515 08/29/21 0451 08/30/21 0652 09/01/21 0159 09/02/21 0718  WBC 7.5   < > 6.6 8.4 7.2 5.6  NEUTROABS 5.7  --   --   --   --   --   HGB 7.2*   < > 8.0* 10.4* 8.3* 8.3*  HCT 23.1*    < > 26.0* 33.1* 26.4* 26.5*  MCV 99.1   < > 96.7 97.6 96.0 97.1  PLT 276   < > 294 287 238 223   < > = values in this interval not displayed.    Medications:     atorvastatin  10 mg Oral QHS   cinacalcet  60 mg Oral Q supper   Darbepoetin Alfa  100 mcg Intravenous Q Tue-HD   diltiazem  120 mg Oral Daily   docusate sodium  100 mg Oral TID   gabapentin  100 mg Oral BID   hydrocortisone  25 mg Rectal BID   metoprolol tartrate  37.5 mg Oral BID   midodrine  10 mg Oral TID WC   pantoprazole  40 mg Oral Daily   polyethylene glycol  17 g Oral Daily   senna  2 tablet Oral QHS   sertraline  100 mg Oral Daily   sevelamer carbonate  2.4 g Oral TID WC    Scott Castro [facility] Pager [phone]

## 2021-09-02 NOTE — Transfer of Care (Signed)
Immediate Anesthesia Transfer of Care Note  Patient: Scott Castro  Procedure(s) Performed: LEFT BELOW KNEE AMPUTATION (Left: Knee)  Patient Location: PACU  Anesthesia Type:General  Level of Consciousness: drowsy, patient cooperative and responds to stimulation  Airway & Oxygen Therapy: Patient Spontanous Breathing and Patient connected to face mask oxygen  Post-op Assessment: Report given to RN and Post -op Vital signs reviewed and stable  Post vital signs: Reviewed and stable  Last Vitals:  Vitals Value Taken Time  BP 133/84 09/02/21 1302  Temp    Pulse 110 09/02/21 1303  Resp 15 09/02/21 1302  SpO2 100 % 09/02/21 1303  Vitals shown include unvalidated device data.  Last Pain:  Vitals:   09/02/21 0958  TempSrc:   PainSc: 3       Patients Stated Pain Goal: 1 (82/60/88 8358)  Complications: No notable events documented.

## 2021-09-02 NOTE — Progress Notes (Signed)
° °  S/P left LE BKA 09/02/2021 by Dr. Trula Slade for ischemia Heparin held until post op day 1.  Patient has Afib.    Plan to resume Heparin 09/03/21  Roxy Horseman PA-C

## 2021-09-02 NOTE — Progress Notes (Signed)
Patient arrived back to 4E from PACU. Vital sign stable. Placed back on tele. CCMD notified. Patient oriented to unit. Call bell within reach. Meal ordered for patient.  Scott Castro

## 2021-09-02 NOTE — Progress Notes (Signed)
Inpatient Rehab Admissions Coordinator:   Pending BKA today.  Will f/u tomorrow after therapies.    Shann Medal, PT, DPT Admissions Coordinator [phone] 09/02/21  9:24 AM

## 2021-09-02 NOTE — Anesthesia Preprocedure Evaluation (Addendum)
Anesthesia Evaluation  Patient identified by MRN, date of birth, ID band Patient awake    Reviewed: Allergy & Precautions  Airway Mallampati: II  TM Distance: >3 FB     Dental   Pulmonary COPD,    breath sounds clear to auscultation       Cardiovascular + Peripheral Vascular Disease   Rhythm:Regular Rate:Normal     Neuro/Psych Seizures -,  PSYCHIATRIC DISORDERS    GI/Hepatic Neg liver ROS, GERD  ,  Endo/Other  negative endocrine ROS  Renal/GU Renal disease     Musculoskeletal  (+) Arthritis ,   Abdominal   Peds  Hematology   Anesthesia Other Findings   Reproductive/Obstetrics                             Anesthesia Physical Anesthesia Plan  ASA: 3  Anesthesia Plan: General   Post-op Pain Management:    Induction:   PONV Risk Score and Plan: Treatment may vary due to age or medical condition  Airway Management Planned: Oral ETT  Additional Equipment:   Intra-op Plan:   Post-operative Plan: Possible Post-op intubation/ventilation  Informed Consent: I have reviewed the patients History and Physical, chart, labs and discussed the procedure including the risks, benefits and alternatives for the proposed anesthesia with the patient or authorized representative who has indicated his/her understanding and acceptance.     Dental advisory given  Plan Discussed with: CRNA  Anesthesia Plan Comments:         Anesthesia Quick Evaluation

## 2021-09-03 ENCOUNTER — Encounter (HOSPITAL_COMMUNITY): Payer: Self-pay | Admitting: Surgery

## 2021-09-03 ENCOUNTER — Inpatient Hospital Stay (HOSPITAL_COMMUNITY): Payer: Medicare Other

## 2021-09-03 DIAGNOSIS — T8753 Necrosis of amputation stump, right lower extremity: Secondary | ICD-10-CM

## 2021-09-03 DIAGNOSIS — Z89512 Acquired absence of left leg below knee: Secondary | ICD-10-CM

## 2021-09-03 LAB — CBC
HCT: 26.7 % — ABNORMAL LOW (ref 39.0–52.0)
Hemoglobin: 8.1 g/dL — ABNORMAL LOW (ref 13.0–17.0)
MCH: 29.7 pg (ref 26.0–34.0)
MCHC: 30.3 g/dL (ref 30.0–36.0)
MCV: 97.8 fL (ref 80.0–100.0)
Platelets: 228 10*3/uL (ref 150–400)
RBC: 2.73 MIL/uL — ABNORMAL LOW (ref 4.22–5.81)
RDW: 17.9 % — ABNORMAL HIGH (ref 11.5–15.5)
WBC: 8.3 10*3/uL (ref 4.0–10.5)
nRBC: 0 % (ref 0.0–0.2)

## 2021-09-03 LAB — BASIC METABOLIC PANEL
Anion gap: 12 (ref 5–15)
BUN: 33 mg/dL — ABNORMAL HIGH (ref 8–23)
CO2: 25 mmol/L (ref 22–32)
Calcium: 8 mg/dL — ABNORMAL LOW (ref 8.9–10.3)
Chloride: 96 mmol/L — ABNORMAL LOW (ref 98–111)
Creatinine, Ser: 5.41 mg/dL — ABNORMAL HIGH (ref 0.61–1.24)
GFR, Estimated: 11 mL/min — ABNORMAL LOW (ref >60)
Glucose, Bld: 85 mg/dL (ref 70–99)
Potassium: 4 mmol/L (ref 3.5–5.1)
Sodium: 133 mmol/L — ABNORMAL LOW (ref 135–145)

## 2021-09-03 LAB — HEPARIN LEVEL (UNFRACTIONATED): Heparin Unfractionated: 0.27 IU/mL — ABNORMAL LOW (ref 0.30–0.70)

## 2021-09-03 IMAGING — US IR CHEST US
1 series · 4 of 4 positions shown · non-contrast
Comparison: CT of the chest on [DATE]

CLINICAL DATA: Loculated right lateral pleural effusion by CT.

EXAM:
CHEST ULTRASOUND

[Series 1: ir (person_name)/(person_name) · 4 of 4 slices shown]
[im 1/4]
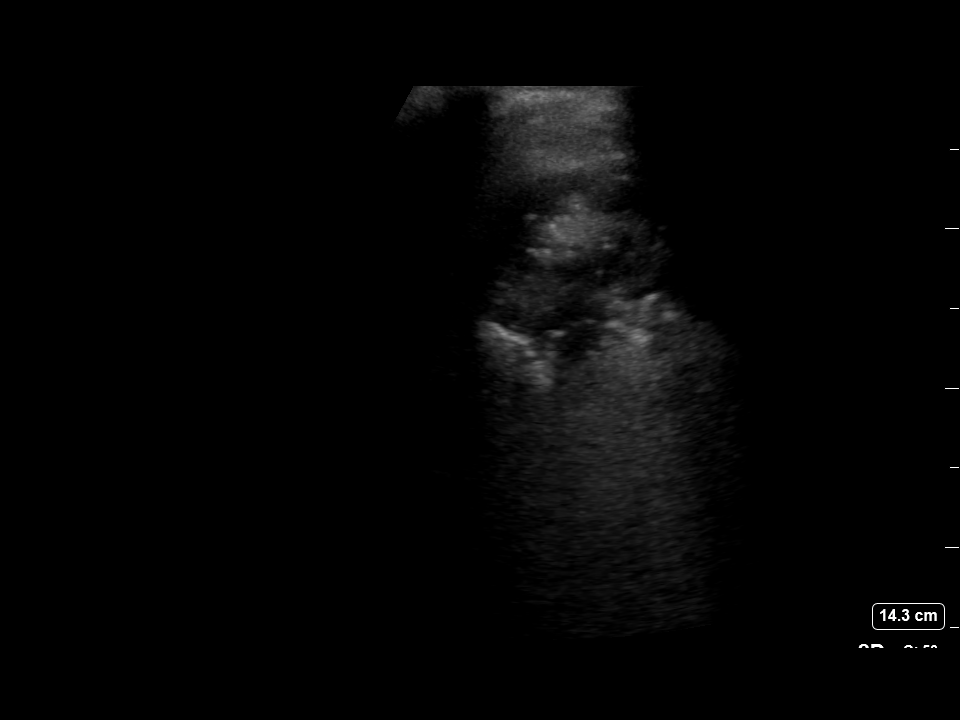
[im 2/4]
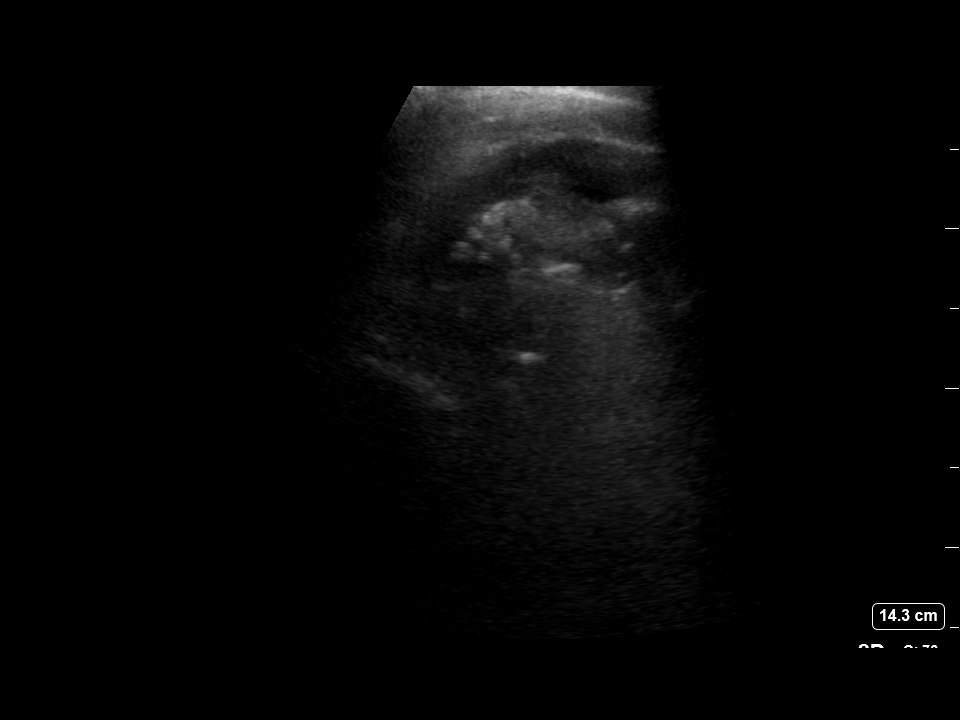
[im 3/4]
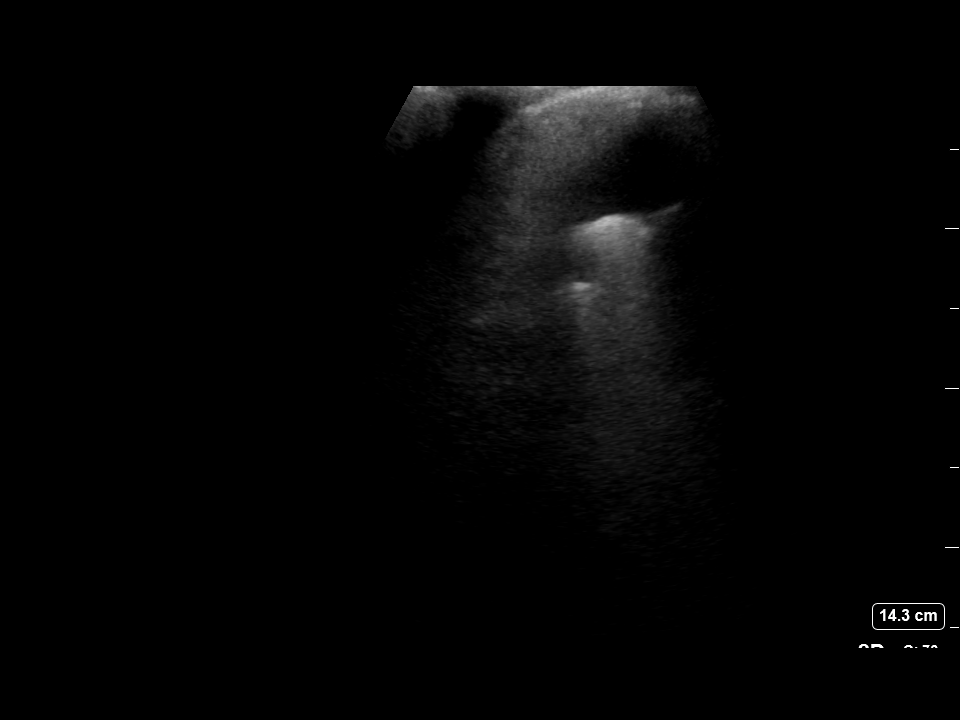
[im 4/4]
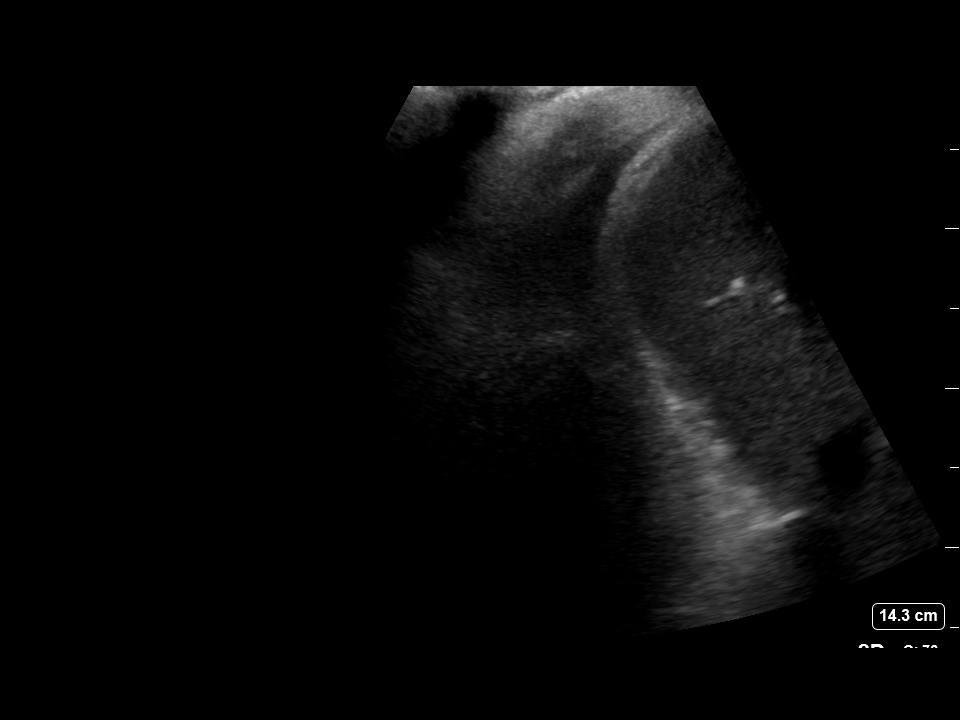

[4 of 4 positions shown; findings below may reference images not displayed]

FINDINGS: Ultrasound today demonstrates only a small amount of right pleural
fluid which cannot be accessed for thoracentesis.
IMPRESSION: Small amount of right pleural fluid by ultrasound. A large enough
pocket was not identified to allow for thoracentesis today.

## 2021-09-03 MED ORDER — HEPARIN (PORCINE) 25000 UT/250ML-% IV SOLN
2150.0000 [IU]/h | INTRAVENOUS | Status: DC
  Administered 2021-09-03: 1900 [IU]/h via INTRAVENOUS
  Administered 2021-09-04: 1950 [IU]/h via INTRAVENOUS
  Filled 2021-09-03 (×2): qty 250

## 2021-09-03 MED ORDER — LIDOCAINE HCL 1 % IJ SOLN
INTRAMUSCULAR | Status: AC
  Filled 2021-09-03: qty 20

## 2021-09-03 NOTE — Care Management Important Message (Signed)
Important Message  Patient Details  Name: Scott Castro MRN: [id number] Date of Birth: 1956-05-28   Medicare Important Message Given:  Yes     [name] 09/03/2021, 8:50 AM

## 2021-09-03 NOTE — Progress Notes (Signed)
PT Cancellation Note  Patient Details Name: RHYLAN KAGEL MRN: [id number] DOB: 1956/03/08   Cancelled Treatment:    Reason Eval/Treat Not Completed: Patient at procedure or test/unavailable (HD). Will follow-up for PT treatment as schedule permits.  Mabeline Caras, PT, DPT Acute Rehabilitation Services  Pager [phone] Office [place] 09/03/2021, 8:10 AM

## 2021-09-03 NOTE — Progress Notes (Signed)
NAME:  Scott Castro, MRN:  [id number], DOB:  1956/02/04, LOS: 4 ADMISSION DATE:  08/30/2021, CONSULTATION DATE:  08/31/21 REFERRING MD:  Dr Tyrell Antonio, CHIEF COMPLAINT: Abnormal chest x-ray  History of Present Illness:  66 yo M PMH ESRD, HTN, Afib on eliquis, COPD with chronic hypoxic respiratory failure on 2-3LNC, PAD/critical limb ischemia BLE s/p R BKA (08/07/21) during hospitalization at [facility] 12/28-1/4, who presented to [facility] from inpt rehab on 08/30/21 for L BKA. Admitted to [facility] and planned for L BKA 1/24. On 1/23 a CXR was obtained which revealed R>L pleural effusions. PCCM was consulted in this setting    In review of prior imaging and records: CT chest performed 67/21/22 which had increase in size of L sided loculated appearing pleural fluid, R sided loculated appearing pleural effusion, pleural thickening, R sided subpleural scarring, enlarged lymph nodes (73mmR paratracheal node, 61mm subcarinal, scattered throughout chest), small pericardial effusion.    Bilateral pleural effusions were noted on 2020 HRCT, at which time their appearance was felt c/w asbestosis   Pertinent  Medical History   Past Medical History:  Diagnosis Date   Barrett's esophagus    Chronic kidney disease    CHRONIC\   Glomerulosclerosis, focal    Hyperparathyroidism due to renal insufficiency (HCC)   End-stage renal disease on hemodialysis Chronic bilateral pleural effusion Chronic obstructive pulmonary disease Chronic hypoxemic respiratory failure Atrial fibrillation on Eliquis Critical limb ischemia S/p right BKA Hypertension Pericardial effusion  Significant Hospital Events: Including procedures, antibiotic start and stop dates in addition to other pertinent events   1/22 admitted from CIR with critical limb ischemia 1/23 PCCM consulted for bilateral effusions 1/24 tolerated dialysis well 1/25 plan for or today for BKA- tolerated BKA well  Interim History / Subjective:  Denies significant  complaints Breathing feels about the same  Objective   Blood pressure 137/82, pulse (!) 108, temperature 98 F (36.7 C), temperature source Oral, resp. rate 16, height 5\' 9"  (1.753 m), weight 69.8 kg, SpO2 100 %.        Intake/Output Summary (Last 24 hours) at 09/03/2021 1407 Last data filed at 09/03/2021 1140 Gross per 24 hour  Intake 240 ml  Output 1000 ml  Net -760 ml   Filed Weights   09/03/21 0557 09/03/21 0802 09/03/21 1140  Weight: 71.5 kg 70.8 kg 69.8 kg    Examination: General: Chronically ill-appearing HENT: Moist oral mucosa Lungs: Decreased air movement bilaterally Cardiovascular: S1-S2 appreciated Abdomen: Soft, bowel sounds appreciated Extremities: Right BKA, left BKA Neuro: Alert and oriented, moving all extremities GU: Fair output  Echocardiogram with normal left ventricular function, reduced right ventricular systolic function, moderate pulmonary hypertension  Resolved Hospital Problem list     Assessment & Plan:   Loculated pleural effusion -Requested IR thoracentesis and fluid to be sent for analysis  Moderate pulmonary hypertension -Related to underlying lung disease and probably chronic hypoxemia -Class V pulmonary hypertension from renal disease probable contributor  Underlying concern for asbestos related lung disease, asbestos related pleural effusion based on previous CT scan findings -Patient stated he worked at [employer], worked at the [employer] but no known exposure to asbestos known to him, was not in the [employer]. Findings at the bases of the lung consistent with rounded atelectasis which would be consistent with asbestos exposure in the past  No role for antibiotics at present -No fever, no leukocytosis, breathing has been stable  Chronic obstructive pulmonary disease -Continue bronchodilators  Critical limb ischemia -Had BKA  Atrial  fibrillation -On heparin at present, on Eliquis as outpatient  End-stage renal  disease -Hemodialysis per nephrology  History of hypertension Barrett's esophagus-on PPI   Sherrilyn Rist, MD [place] [facility] Pager: See Shea Evans

## 2021-09-03 NOTE — Procedures (Signed)
Patient was seen on dialysis and the procedure was supervised.  BFR 400  Via AVF BP is  139/81.   Patient appears to be tolerating treatment well  Louis Meckel 09/03/2021

## 2021-09-03 NOTE — Progress Notes (Signed)
OT Cancellation Note  Patient Details Name: Scott Castro MRN: [id number] DOB: 1956-02-20   Cancelled Treatment:    Reason Eval/Treat Not Completed: Patient at procedure or test/ unavailable (HD. Will return as schedule allows.)  Donald, OTR/L Acute Rehab Pager: [phone] Office: [phone] 09/03/2021, 8:08 AM

## 2021-09-03 NOTE — Progress Notes (Signed)
ANTICOAGULATION CONSULT NOTE - Follow Up Consult  Pharmacy Consult for Apixaban >> Heparin Indication: atrial fibrillation  No Known Allergies  Patient Measurements: Height: 5\' 9"  (175.3 cm) Weight: 70.8 kg (156 lb 1.4 oz) IBW/kg (Calculated) : 70.7 Heparin Dosing Weight: 74.6 kg  Vital Signs: Temp: 99.7 F (37.6 C) (01/26 0802) Temp Source: Axillary (01/26 0344) BP: 139/81 (01/26 1030) Pulse Rate: 108 (01/26 1030)  Labs: Recent Labs    09/01/21 0159 09/02/21 0718 09/03/21 0305  HGB 8.3* 8.3* 8.1*  HCT 26.4* 26.5* 26.7*  PLT 238 223 228  APTT 73* 93*  --   HEPARINUNFRC 0.60 0.51  --   CREATININE 6.37* 4.56* 5.41*     Estimated Creatinine Clearance: 13.4 mL/min (A) (by C-G formula based on SCr of 5.41 mg/dL (H)).   Assessment: 66 yo M recently had right BKA for PAD. He has been on apixaban for his AF.   He is s/p left angiogram on 1/20 with no options for revascularization and underwent a left BKA on 1/25. Pharmacy was consulted to resume heparin on 1/26 per vascular.   Patient had been on a heparin gtt earlier on at admission and was therapeutic on 1950 units/hr. aPTT and HL had correlated with apixiban washout.   No new weight yet s/p amputation. Will start drip at slightly lower rate given likely loss of weight.   Hgb low but stable at 8.1 and plts stable at 228.   Spoke with RN no issues with bleeding, patient is currently on HD. She will start heparin gtt when the patient returns to the floor.   Goal of Therapy:  Heparin level 0.3-0.7 units/ml aPTT 66-102 seconds Monitor platelets by anticoagulation protocol: Yes   Plan:  Start heparin gtt at 1900 units/hr Heparin level in 8 hours F/u daily HL and CBC while on heparin Monitor for s/x of bleeding

## 2021-09-03 NOTE — Progress Notes (Signed)
ANTICOAGULATION CONSULT NOTE - Follow Up Consult  Pharmacy Consult for Apixaban >> Heparin Indication: atrial fibrillation  No Known Allergies  Patient Measurements: Height: 5\' 9"  (175.3 cm) Weight: 69.8 kg (153 lb 14.1 oz) IBW/kg (Calculated) : 70.7 Heparin Dosing Weight: 74.6 kg  Vital Signs: Temp: 98 F (36.7 C) (01/26 1556) Temp Source: Oral (01/26 1556) BP: 133/78 (01/26 1556) Pulse Rate: 108 (01/26 1556)  Labs: Recent Labs    09/01/21 0159 09/02/21 0718 09/03/21 0305 09/03/21 1832  HGB 8.3* 8.3* 8.1*  --   HCT 26.4* 26.5* 26.7*  --   PLT 238 223 228  --   APTT 73* 93*  --   --   HEPARINUNFRC 0.60 0.51  --  0.27*  CREATININE 6.37* 4.56* 5.41*  --      Estimated Creatinine Clearance: 13.3 mL/min (A) (by C-G formula based on SCr of 5.41 mg/dL (H)).   Assessment: 66 yo M recently had right BKA for PAD. He has been on apixaban for his AF.   He is s/p left angiogram on 1/20 with no options for revascularization and underwent a left BKA on 1/25. Pharmacy was consulted to resume heparin on 1/26 per vascular.   Patient had been on a heparin gtt earlier on at admission and was therapeutic on 1950 units/hr. aPTT and HL had correlated with apixiban washout.   No new weight yet s/p amputation. Will start drip at slightly lower rate given likely loss of weight.   Hgb low but stable at 8.1 and plts stable at 228.   Heparin level came back slightly subtherapeutic tonight at 0.27. We will increase rate and check in AM.   Goal of Therapy:  Heparin level 0.3-0.7 units/ml aPTT 66-102 seconds Monitor platelets by anticoagulation protocol: Yes   Plan:  Increase heparin gtt to 1950 units/hr Heparin level in AM F/u daily HL and CBC while on heparin Monitor for s/x of bleeding   Onnie Boer, PharmD, BCIDP, AAHIVP, CPP Infectious Disease Pharmacist 09/03/2021 7:33 PM

## 2021-09-03 NOTE — Progress Notes (Signed)
This chaplain attempted F/U spiritual care visit. The Pt. is not in the room at the time of the visit. The chaplain checked in with the Pt. RN and will F/U.  Chaplain Sallyanne Kuster [phone]

## 2021-09-03 NOTE — Progress Notes (Signed)
[facility] RN already called for Report.

## 2021-09-03 NOTE — Progress Notes (Signed)
[facility] Progress Note   66 y.o. male PMH COPD Barrett's, hyperparathyroidism, ESRD, failed renal transplant, PAF on Eliquis p/w ischemic foot ulcer -> BKA on 12/30.  Patient receives his dialysis treatments at [place] with [facility].   Dialysis at [facility] with [facility] EDW 68.4kg? EDW before the amputation was 72kg     Assessment/ Plan:   ESRD - TTS at [facility] w/ [facility] - establishing new EDW  - HD set up for MWF outpt now, though this may further change, see below. Due to staffing-  his HD got put off to Tuesday  - so will keep on TTS schedule for now -  next HD today, then Saturday   -  Uses elisio dialyzer at [place] but has been tolerating our standard optiflux dialyzer here  [name]- [name] 2.4 grams packet TIDM and 1600 with snacks-  have decreased snack dose given phos of 3 . [name] 60mg  qdaily as well.  Will need to check phos again at some point  Anemia - on ESA and s/p iron load.  Required transfusion this hosp.  ^ ESA dose. Hgb in the 8's for now-  likely to go down with surgery PAD with critical limb ischemia s/p rt BKA for gangrene - now will require LBKA -  done 1/25  PAF - rate controlled and  Eliquis typically. On dilt and metop - cont even prior to dialysis.  Was having some RVR periodically during this admission and cardiology has been consulted - titrating BB and watching BP.  He's on midodrine now as well.  GERD COPD   9. Dispo: private transport to [place] HD unit from his [place] home too costly, will switch from [name] to [facility]- MWF-  has spot.   Now that he's having a 2nd amputation he will likely go to SNF and plans may need to change further depending on location of SNF.   Subjective:    Seen in HD-  had BKA yest-  says pain is under control "dialysis is the least of my concerns"   BP 139/81    Pulse (!) 108    Temp 99.7 F (37.6 C)    Resp (!) 23    Ht 5\' 9"  (1.753 m)    Wt 70.8 kg    SpO2 100%     BMI 23.05 kg/m   Intake/Output Summary (Last 24 hours) at 09/03/2021 1035 Last data filed at 09/02/2021 1800 Gross per 24 hour  Intake 818.33 ml  Output 100 ml  Net 718.33 ml   Weight change: -1.5 kg  Physical Exam: GEN: NAD, A&Ox3, NCAT HEENT: MMM LUNGS: CTA B/L no rales, rhonchi or wheezing CV: irregular irregular HR low 100s, crescendo murmur ABD: SNDNT +BS  EXT:R BKA, L foot with purpura, blistering, partially wrapped ACCESS: Lt BBT--> distal portion is aneurysmal +t/b  Imaging: No results found.  Labs: BMET Recent Labs  Lab 08/27/21 1830 08/29/21 1629 08/31/21 0145 09/01/21 0159 09/02/21 0718 09/03/21 0305  NA 128* 133* 131* 129* 132* 133*  K 4.1 3.7 3.9 3.9 3.5 4.0  CL 92* 98 94* 95* 95* 96*  CO2 23 24 24 24 25 25   GLUCOSE 104* 144* 96 88 89 85  BUN 49* 38* 30* 39* 25* 33*  CREATININE 6.45* 5.82* 5.01* 6.37* 4.56* 5.41*  CALCIUM 7.2* 7.3* 7.6* 7.7* 8.1* 8.0*  PHOS 3.5 3.2  --   --   --   --    CBC Recent  Labs  Lab 08/30/21 4132 09/01/21 0159 09/02/21 0718 09/03/21 0305  WBC 8.4 7.2 5.6 8.3  HGB 10.4* 8.3* 8.3* 8.1*  HCT 33.1* 26.4* 26.5* 26.7*  MCV 97.6 96.0 97.1 97.8  PLT 287 238 223 228    Medications:     atorvastatin  10 mg Oral QHS   Chlorhexidine Gluconate Cloth  6 each Topical Q0600   cinacalcet  60 mg Oral Q supper   Darbepoetin Alfa  100 mcg Intravenous Q Tue-HD   diltiazem  120 mg Oral Daily   docusate sodium  100 mg Oral TID   gabapentin  100 mg Oral BID   hydrocortisone  25 mg Rectal BID   metoprolol tartrate  37.5 mg Oral BID   midodrine  10 mg Oral TID WC   pantoprazole  40 mg Oral Daily   polyethylene glycol  17 g Oral Daily   senna  2 tablet Oral QHS   sertraline  100 mg Oral Daily   sevelamer carbonate  2.4 g Oral TID WC    Wolfgang Finigan A [facility] Pager [phone]

## 2021-09-03 NOTE — Progress Notes (Signed)
Received a call from [facility] late yesterday. Clinic is going to need to place pt in another chair time due to needing pt's assigned chair time. Navigator will need to f/u with clinic once d/c disposition/date is known to coordinate new chair time.Will follow and assist.   Melven Sartorius Renal Navigator [phone]

## 2021-09-03 NOTE — Progress Notes (Signed)
This chaplain is present with the Pt. for F/U spiritual care as the RN is changing the Pt. bandage.  The chaplain gave the Pt. space to choose a chaplain visit. The Pt. wavered in his acceptance of the chaplain visit and later agreed.  The Pt. shares his concerns around his wound becoming "necrotic". The chaplain understands from the Pt., the Pt. did not have the same experience with the previous BKA. The chaplain is observing a pattern of the Pt. thinking through multiple options in many areas of his life. The Pt. resting places vary in the positive and the negative. The chaplain began exploring hope with the Pt.  The chaplain asked about the Pt. appetite. The Pt. acknowledged he tried  "a potato and mac and cheese," both of which he ate only a small portion and wants something that "tastes better."    The Pt. remains open to exploring resources in the amputee community.  The Pt. affirms his ability to use a computer and interest in exploring the web sites at the chaplain's next visit.  Chaplain Sallyanne Kuster [phone]

## 2021-09-03 NOTE — Progress Notes (Addendum)
°  Progress Note    09/03/2021 7:47 AM 1 Day Post-Op  Subjective:  minimal pain overnight   Vitals:   09/02/21 2300 09/03/21 0344  BP:  124/80  Pulse: (!) 101 (!) 106  Resp: 18 (!) 21  Temp:  97.8 F (36.6 C)  SpO2: 100% 100%    Physical Exam: Incisions:  L BKA dressing in place without breakthrough bleeding   CBC    Component Value Date/Time   WBC 8.3 09/03/2021 0305   RBC 2.73 (L) 09/03/2021 0305   HGB 8.1 (L) 09/03/2021 0305   HGB 11.4 (L) 06/11/2012 0501   HCT 26.7 (L) 09/03/2021 0305   HCT 34.6 (L) 06/11/2012 0501   PLT 228 09/03/2021 0305   PLT 251 06/11/2012 0501   MCV 97.8 09/03/2021 0305   MCV 95 06/11/2012 0501   MCH 29.7 09/03/2021 0305   MCHC 30.3 09/03/2021 0305   RDW 17.9 (H) 09/03/2021 0305   RDW 14.4 06/11/2012 0501   LYMPHSABS 0.8 08/26/2021 1004   LYMPHSABS 1.2 06/11/2012 0501   MONOABS 0.9 08/26/2021 1004   MONOABS 0.7 06/11/2012 0501   EOSABS 0.0 08/26/2021 1004   EOSABS 0.2 06/11/2012 0501   BASOSABS 0.0 08/26/2021 1004   BASOSABS 0.1 06/11/2012 0501    BMET    Component Value Date/Time   NA 133 (L) 09/03/2021 0305   NA 142 06/10/2012 0924   K 4.0 09/03/2021 0305   K 4.5 06/16/2013 1315   CL 96 (L) 09/03/2021 0305   CL 102 06/10/2012 0924   CO2 25 09/03/2021 0305   CO2 29 06/10/2012 0924   GLUCOSE 85 09/03/2021 0305   GLUCOSE 83 06/10/2012 0924   BUN 33 (H) 09/03/2021 0305   BUN 35 (H) 06/10/2012 0924   CREATININE 5.41 (H) 09/03/2021 0305   CREATININE 10.29 (H) 06/10/2012 0924   CALCIUM 8.0 (L) 09/03/2021 0305   CALCIUM 9.2 06/10/2012 0924   GFRNONAA 11 (L) 09/03/2021 0305   GFRNONAA 5 (L) 06/10/2012 0924   GFRAA 9 (L) 09/18/2019 0430   GFRAA 6 (L) 06/10/2012 0924    INR    Component Value Date/Time   INR 1.7 (H) 08/05/2021 1846     Intake/Output Summary (Last 24 hours) at 09/03/2021 0747 Last data filed at 09/02/2021 1800 Gross per 24 hour  Intake 818.33 ml  Output 100 ml  Net 718.33 ml     Assessment/Plan:   66 y.o. male is s/p left below knee amputation  1 Day Post-Op  - Dressing down tomorrow - ok to restart heparin today - PT/OT eval   Dagoberto Ligas, PA-C [facility] [phone] 09/03/2021 7:47 AM    Patient seen in HD.  Pain controlled. Dressing dry.  Will plan for dressing change tomorrow.    Annamarie Major

## 2021-09-03 NOTE — Progress Notes (Signed)
PROGRESS NOTE    Scott Castro  ZOX:[id number] DOB: [birth year] DOA: 08/30/2021 PCP: Franciso Bend, MD   Brief Narrative: 66 year old past medical history significant for ESRD on dialysis TTS, hypertension, hyperlipidemia, PAF, depression, chronic respiratory failure 2 L of oxygen, PAD recent right BKA on 08/07/2021 secondary to critical limb ischemia discharged to rehab.  He was seen by cardiology in inpatient rehab due to A. fib RVR and he was referred for inpatient telemetry admission.  Recent hospitalized at [facility] from 12/28 until 1//2023 right lower extremity critical limb ischemia status post BKA by vascular on 12/30.  Was just discharged to inpatient rehab here at [facility].  Found to be in RVR RVR admitted for further evaluation.  Patient  also had angiography 1/20 by Dr. [name] with BL diffuse infrapopliteal disease of the left lower extremity, vascular is recommending left BKA early this week.  Patient noted to be more hypoxic on 1/23, chest x-ray showed bilateral loculated pleural effusion, pulmonologist consulted for further evaluation. CT chest showed right loculated pleural effusion on right. Right lower lobe with irregular consolidation right lower lobe, he will need PET scan out patient.   Assessment & Plan:   Principal Problem:   Critical limb ischemia of left lower extremity/PAD  Active Problems:   COPD (chronic obstructive pulmonary disease) (HCC)   Chronic respiratory failure with hypoxia (HCC)   ESRD (end stage renal disease) (HCC)   Barrett esophagus   Anemia   Mixed hyperlipidemia   S/P BKA (below knee amputation), right (HCC)   Depressive reaction   AF (paroxysmal atrial fibrillation) (HCC)  1-Critical Limb ischemia of left lower extremity/PAD -Angiography 1/20 by Dr. Luan Pulling revealed diffuse infrapopliteal disease of the left with no revascularization options -Recent BKA on the right 12/22 -Underwent  left BKA 1/25th -Resume heparin today.  Tolerated  procedure well.  Dressing to be removed tomorrow.   2-A. Fib Continue with  metoprolol and Cardizem.  Appreciate cardiology assistance.  He didn't received Cardizem/metoprolol 1/25 due to Soft BP.  Plan to resume heparin today  3-Acute on chronic hypoxic respiratory failure Loculated bilateral moderate pleural effusion Moderate Pulmonary HTN;  COPD -Pulmonologist consulted. -Incentive Spirometry ordered. -Continue with nebulizer as needed -CT chest 1/23: Patchy nodularity and ground-glass in the dependent RIGHT upper lobe and bilateral lower lobes raising the question of pneumonitis with distribution raising the question of sequela of aspiration. More dense consolidative changes and irregular bronchial structures in the RIGHT lower lobe central to an area of rounded atelectasis is of uncertain significance. Given the more mass like appearance in this area and increasing size of mediastinal lymph nodes over time a possibility of neoplasm is considered. PET scan may be helpful on follow-up in addition to contrasted imaging with CT for further assessment.  Loculated and mildly nodular appearing pleural fluid in the RIGHT chest with increase and with decreased pleural fluid in the LEFT chest compared to prior imaging.  -IR consulted for thoracentesis.  There was no fluid pocket identified that was safe to access by ultrasound thoracentesis. Stable on 2 L oxygen.  -Final recommendation from pulmonologist  4-ESRD on hemodialysis -S/p renal transplant [place] with hemodialysis -Nephrology  following -Had HD  1/24.--1/26  Barrett esophagus: Continue with PPI  Anemia:  anemia of chronic disease: IV iron per nephrology  Hyperlipidemia: Continue with statins  Depression: Continue with Zoloft  Hyponatremia; fluid management with HD>    Severe tricuspid regurgitation, mild mitral regurgitation. Management per cardiology.  Estimated body mass index is 22.72 kg/m as  calculated from the following:   Height as of this encounter: 5\' 9"  (1.753 m).   Weight as of this encounter: 69.8 kg.   DVT prophylaxis: Heparin Gtt Code Status: Full code Family Communication: care discussed with patient  Disposition Plan:  Status is: Inpatient  Remains inpatient appropriate because: management of critical limb ischemia. Resp failure  CIR when bed available        Consultants:  Nephrology Cardiology Vascular.  Pulmonologist   Procedures:    Antimicrobials:    Subjective: I saw patient in HD, pain controlled. No new complaints.  Objective: Vitals:   09/03/21 1100 09/03/21 1140 09/03/21 1231 09/03/21 1233  BP: 137/74 (!) 136/53 137/82   Pulse: (!) 107 (!) 108  (!) 108  Resp: 16 16    Temp:  98 F (36.7 C)    TempSrc:  Oral    SpO2: 100% 100%    Weight:  69.8 kg    Height:        Intake/Output Summary (Last 24 hours) at 09/03/2021 1504 Last data filed at 09/03/2021 1140 Gross per 24 hour  Intake 240 ml  Output 1000 ml  Net -760 ml    Filed Weights   09/03/21 0557 09/03/21 0802 09/03/21 1140  Weight: 71.5 kg 70.8 kg 69.8 kg    Examination:  General exam: Chronic ill appearing Respiratory system: BL crackles.  Cardiovascular system: S1, S2 systolic murmur Gastrointestinal system: Bowel sounds present, soft nontender non distended Central nervous system: Alert follows command Extremities: R BKA , left SP BKA     Data Reviewed: I have personally reviewed following labs and imaging studies  CBC: Recent Labs  Lab 08/29/21 0451 08/30/21 0652 09/01/21 0159 09/02/21 0718 09/03/21 0305  WBC 6.6 8.4 7.2 5.6 8.3  HGB 8.0* 10.4* 8.3* 8.3* 8.1*  HCT 26.0* 33.1* 26.4* 26.5* 26.7*  MCV 96.7 97.6 96.0 97.1 97.8  PLT 294 287 238 223 638    Basic Metabolic Panel: Recent Labs  Lab 08/27/21 1830 08/29/21 1629 08/31/21 0145 09/01/21 0159 09/02/21 0718 09/03/21 0305  NA 128* 133* 131* 129* 132* 133*  K 4.1 3.7 3.9 3.9 3.5 4.0   CL 92* 98 94* 95* 95* 96*  CO2 23 24 24 24 25 25   GLUCOSE 104* 144* 96 88 89 85  BUN 49* 38* 30* 39* 25* 33*  CREATININE 6.45* 5.82* 5.01* 6.37* 4.56* 5.41*  CALCIUM 7.2* 7.3* 7.6* 7.7* 8.1* 8.0*  PHOS 3.5 3.2  --   --   --   --     GFR: Estimated Creatinine Clearance: 13.3 mL/min (A) (by C-G formula based on SCr of 5.41 mg/dL (H)). Liver Function Tests: Recent Labs  Lab 08/27/21 1830 08/29/21 1629  ALBUMIN 1.8* 1.9*    No results for input(s): LIPASE, AMYLASE in the last 168 hours. No results for input(s): AMMONIA in the last 168 hours. Coagulation Profile: No results for input(s): INR, PROTIME in the last 168 hours. Cardiac Enzymes: No results for input(s): CKTOTAL, CKMB, CKMBINDEX, TROPONINI in the last 168 hours. BNP (last 3 results) No results for input(s): PROBNP in the last 8760 hours. HbA1C: No results for input(s): HGBA1C in the last 72 hours. CBG: No results for input(s): GLUCAP in the last 168 hours.  Lipid Profile: No results for input(s): CHOL, HDL, LDLCALC, TRIG, CHOLHDL, LDLDIRECT in the last 72 hours.  Thyroid Function Tests: No results for input(s): TSH, T4TOTAL, FREET4, T3FREE, THYROIDAB in the last  72 hours. Anemia Panel: No results for input(s): VITAMINB12, FOLATE, FERRITIN, TIBC, IRON, RETICCTPCT in the last 72 hours. Sepsis Labs: No results for input(s): PROCALCITON, LATICACIDVEN in the last 168 hours.  No results found for this or any previous visit (from the past 240 hour(s)).       Radiology Studies: No results found.      Scheduled Meds:  atorvastatin  10 mg Oral QHS   Chlorhexidine Gluconate Cloth  6 each Topical Q0600   cinacalcet  60 mg Oral Q supper   Darbepoetin Alfa  100 mcg Intravenous Q Tue-HD   diltiazem  120 mg Oral Daily   docusate sodium  100 mg Oral TID   gabapentin  100 mg Oral BID   hydrocortisone  25 mg Rectal BID   metoprolol tartrate  37.5 mg Oral BID   midodrine  10 mg Oral TID WC   pantoprazole  40 mg  Oral Daily   polyethylene glycol  17 g Oral Daily   senna  2 tablet Oral QHS   sertraline  100 mg Oral Daily   sevelamer carbonate  2.4 g Oral TID WC   Continuous Infusions:  sodium chloride     ferric gluconate (FERRLECIT) IVPB 125 mg (09/03/21 1003)   heparin 1,900 Units/hr (09/03/21 1242)     LOS: 4 days    Time spent: 35 Minutes.     Elmarie Shiley, MD [facility]   If 7PM-7AM, please contact night-coverage [email or web address]  09/03/2021, 3:04 PM

## 2021-09-03 NOTE — Progress Notes (Signed)
Pt was brought to [name] for diagnostic thoracentesis. Upon US examination, there was no fluid pocket identified that was safe to access.  Exam was ended and explained to patient.  Patient was in agreement with findings.      Narda Rutherford, AGNP-BC 09/03/2021, 2:57 PM

## 2021-09-04 DIAGNOSIS — Z89511 Acquired absence of right leg below knee: Secondary | ICD-10-CM

## 2021-09-04 LAB — CBC
HCT: 27.8 % — ABNORMAL LOW (ref 39.0–52.0)
Hemoglobin: 8.7 g/dL — ABNORMAL LOW (ref 13.0–17.0)
MCH: 30.7 pg (ref 26.0–34.0)
MCHC: 31.3 g/dL (ref 30.0–36.0)
MCV: 98.2 fL (ref 80.0–100.0)
Platelets: 200 10*3/uL (ref 150–400)
RBC: 2.83 MIL/uL — ABNORMAL LOW (ref 4.22–5.81)
RDW: 18.3 % — ABNORMAL HIGH (ref 11.5–15.5)
WBC: 8.6 10*3/uL (ref 4.0–10.5)
nRBC: 0 % (ref 0.0–0.2)

## 2021-09-04 LAB — HEPARIN LEVEL (UNFRACTIONATED)
Heparin Unfractionated: 0.22 IU/mL — ABNORMAL LOW (ref 0.30–0.70)
Heparin Unfractionated: 0.28 IU/mL — ABNORMAL LOW (ref 0.30–0.70)

## 2021-09-04 MED ORDER — APIXABAN 5 MG PO TABS
5.0000 mg | ORAL_TABLET | Freq: Two times a day (BID) | ORAL | Status: DC
  Administered 2021-09-04 – 2021-09-07 (×7): 5 mg via ORAL
  Filled 2021-09-04 (×8): qty 1

## 2021-09-04 MED ORDER — CHLORHEXIDINE GLUCONATE CLOTH 2 % EX PADS: 6.0000 | MEDICATED_PAD | Freq: Every day | CUTANEOUS | Status: DC

## 2021-09-04 MED ORDER — SORBITOL 70 % SOLN
30.0000 mL | Freq: Once | Status: AC
  Administered 2021-09-06: 30 mL via ORAL
  Filled 2021-09-04: qty 30

## 2021-09-04 MED ORDER — SODIUM CHLORIDE 0.9 % IV SOLN
12.5000 mg | Freq: Once | INTRAVENOUS | Status: AC
  Administered 2021-09-05: 12.5 mg via INTRAVENOUS
  Filled 2021-09-04: qty 0.5

## 2021-09-04 NOTE — Progress Notes (Signed)
Orthopedic Tech Progress Note Patient Details:  ARMONTE TORTORELLA 06-26-1956 [id number] Called in order to [place] for rentention sock Patient ID: NICKLOUS ABURTO, male   DOB: March 31, 1956, 66 y.o.   MRN: [id number]  [name] 09/04/2021, 8:06 AM

## 2021-09-04 NOTE — Progress Notes (Signed)
Physical Therapy Re-Evaluation and Treatment Patient Details Name: Scott Castro MRN: [id number] DOB: [birth year] Today's Date: 09/04/2021   History of Present Illness The pt is a 66 yo male presenting on 1/20 from AIR with bilateral diffuse infrapopliteal disease of the left lower extremity. Underwent L BKA 09/02/21. Recent hospitalization at [facility] (08/05/21-08/12/21) for R BKA on 12/30 with d/c to AIR. PMH including  ESRD on dialysis TTS, hypertension, hyperlipidemia, PAF, depression, and chronic respiratory failure 2 L of oxygen.    PT Comments    PT re-evaluation completed following L BKA 09/02/21. Pt required max assist supine to sit, min guard assist sitting EOB x 10 minutes, and mod assist sit to supine. Unable to progress OOB to recliner due to pain. Pt instructed in BLE exercises (quad sets, knee flex/ext) to perform throughout the day. Goals adjusted as needed. Pt continues to be a good candidate for AIR. He is very motivated to mobilize and regain his independence.    Recommendations for follow up therapy are one component of a multi-disciplinary discharge planning process, led by the attending physician.  Recommendations may be updated based on patient status, additional functional criteria and insurance authorization.  Follow Up Recommendations  Acute inpatient rehab (3hours/day)     Assistance Recommended at Discharge Frequent or constant Supervision/Assistance  Patient can return home with the following Assistance with cooking/housework;Assist for transportation;A lot of help with walking and/or transfers;A lot of help with bathing/dressing/bathroom;Help with stairs or ramp for entrance   Equipment Recommendations  Wheelchair (measurements PT);Wheelchair cushion (measurements PT);Other (comment) (slide board)    Recommendations for Other Services Rehab consult     Precautions / Restrictions Precautions Precautions: Fall Restrictions RLE Weight Bearing: Non weight bearing LLE  Weight Bearing: Non weight bearing     Mobility  Bed Mobility Overal bed mobility: Needs Assistance Bed Mobility: Supine to Sit, Sit to Supine     Supine to sit: HOB elevated, Max assist Sit to supine: Mod assist, HOB elevated   General bed mobility comments: use of bed pad to scoot hips toward EOB, assist to elevate trunk, pt able to assist by pulling up on bedrail    Transfers                        Ambulation/Gait                   Stairs             Wheelchair Mobility    Modified Rankin (Stroke Patients Only)       Balance Overall balance assessment: Needs assistance Sitting-balance support: Feet unsupported, Single extremity supported, Bilateral upper extremity supported Sitting balance-Leahy Scale: Poor Sitting balance - Comments: min guard static sit EOB                                    Cognition Arousal/Alertness: Awake/alert Behavior During Therapy: WFL for tasks assessed/performed Overall Cognitive Status: Within Functional Limits for tasks assessed                                 General Comments: Pt in poor spirits. Very down on himself, stating his body has always betrayed him.        Exercises Amputee Exercises Quad Sets: AROM, Both, 10 reps Knee Flexion: AROM, Right, Left, 5 reps Knee  Extension: AROM, Right, Left, 5 reps    General Comments General comments (skin integrity, edema, etc.): HR in 100s. SpO2 99% on 3L      Pertinent Vitals/Pain Pain Assessment Pain Assessment: 0-10 Pain Score: 8  Pain Location: L residual limb with mobility Pain Descriptors / Indicators: Grimacing, Guarding, Moaning Pain Intervention(s): Monitored during session, Repositioned, Limited activity within patient's tolerance    Home Living                          Prior Function            PT Goals (current goals can now be found in the care plan section) Acute Rehab PT Goals Patient  Stated Goal: go to acute rehab PT Goal Formulation: With patient Time For Goal Achievement: 09/18/21 Potential to Achieve Goals: Good Progress towards PT goals: Goals downgraded-see care plan (due to pt now being s/p bilat BKA)    Frequency    Min 3X/week      PT Plan Current plan remains appropriate    Co-evaluation              AM-PAC PT "6 Clicks" Mobility   Outcome Measure  Help needed turning from your back to your side while in a flat bed without using bedrails?: A Lot Help needed moving from lying on your back to sitting on the side of a flat bed without using bedrails?: A Lot Help needed moving to and from a bed to a chair (including a wheelchair)?: Total Help needed standing up from a chair using your arms (e.g., wheelchair or bedside chair)?: Total Help needed to walk in hospital room?: Total Help needed climbing 3-5 steps with a railing? : Total 6 Click Score: 8    End of Session Equipment Utilized During Treatment: Oxygen Activity Tolerance: Patient tolerated treatment well Patient left: in bed;with call bell/phone within reach;with bed alarm set Nurse Communication: Mobility status PT Visit Diagnosis: Unsteadiness on feet (R26.81);Muscle weakness (generalized) (M62.81);Other abnormalities of gait and mobility (R26.89);Pain Pain - Right/Left: Left Pain - part of body: Leg     Time: 2800-3491 PT Time Calculation (min) (ACUTE ONLY): 24 min  Charges:  $Therapeutic Activity: 8-22 mins                     Lorrin Goodell, PT  Office # [phone] Pager [phone]    Lorriane Shire 09/04/2021, 9:40 AM

## 2021-09-04 NOTE — Progress Notes (Signed)
PROGRESS NOTE    Scott Castro  JAS:[id number] DOB: 03-Nov-1955 DOA: 08/30/2021 PCP: Franciso Bend, MD   Brief Narrative: 66 year old past medical history significant for ESRD on dialysis TTS, hypertension, hyperlipidemia, PAF, depression, chronic respiratory failure 2 L of oxygen, PAD recent right BKA on 08/07/2021 secondary to critical limb ischemia discharged to rehab.  He was seen by cardiology in inpatient rehab due to A. fib RVR and he was referred for inpatient telemetry admission.  Recent hospitalized at [facility] from 12/28 until 1//2023 right lower extremity critical limb ischemia status post BKA by vascular on 12/30.  Was just discharged to inpatient rehab here at [facility].  Found to be in RVR RVR admitted for further evaluation.  Patient  also had angiography 1/20 by Dr. [name] with BL diffuse infrapopliteal disease of the left lower extremity, vascular is recommending left BKA early this week.  Patient noted to be more hypoxic on 1/23, chest x-ray showed bilateral loculated pleural effusion, pulmonologist consulted for further evaluation. CT chest showed right loculated pleural effusion on right. Right lower lobe with irregular consolidation right lower lobe, he will need PET scan out patient.  Awaiting CIR admission.   Assessment & Plan:   Principal Problem:   Critical limb ischemia of left lower extremity/PAD  Active Problems:   COPD (chronic obstructive pulmonary disease) (HCC)   Chronic respiratory failure with hypoxia (HCC)   ESRD (end stage renal disease) (HCC)   Barrett esophagus   Anemia   Mixed hyperlipidemia   S/P BKA (below knee amputation), right (HCC)   Depressive reaction   AF (paroxysmal atrial fibrillation) (HCC)  1-Critical Limb ischemia of left lower extremity/PAD -Angiography 1/20 by Dr. Luan Pulling revealed diffuse infrapopliteal disease of the left with no revascularization options. -Recent BKA on the right 12/22 -Underwent  left BKA 1/25th -Resume heparin  today.  Tolerated procedure well.  Dressing to be removed today.  Stable for transfer per vascular.   2-A. Fib Continue with  metoprolol and Cardizem.  Appreciate cardiology assistance.  He didn't received Cardizem/metoprolol 1/25 due to Soft BP.  Resume eliquis.   3-Acute on chronic hypoxic respiratory failure Loculated bilateral moderate pleural effusion Moderate Pulmonary HTN;  COPD -Pulmonologist consulted. -Incentive Spirometry ordered. -Continue with nebulizer as needed -CT chest 1/23: Patchy nodularity and ground-glass in the dependent RIGHT upper lobe and bilateral lower lobes raising the question of pneumonitis with distribution raising the question of sequela of aspiration. More dense consolidative changes and irregular bronchial structures in the RIGHT lower lobe central to an area of rounded atelectasis is of uncertain significance. Given the more mass like appearance in this area and increasing size of mediastinal lymph nodes over time a possibility of neoplasm is considered. PET scan may be helpful on follow-up in addition to contrasted imaging with CT for further assessment.  Loculated and mildly nodular appearing pleural fluid in the RIGHT chest with increase and with decreased pleural fluid in the LEFT chest compared to prior imaging.  -IR consulted for thoracentesis.  There was no fluid pocket identified that was safe to access by ultrasound thoracentesis. Stable on 2 L oxygen.  Needs follow up CT chest out patient.    4-ESRD on hemodialysis -S/p renal transplant [place] with hemodialysis -Nephrology  following -Had HD  1/24.--1/26  Barrett esophagus: Continue with PPI  Anemia:  anemia of chronic disease: IV iron per nephrology  Hyperlipidemia: Continue with statins  Depression: Continue with Zoloft  Hyponatremia; fluid management with HD>  Severe tricuspid regurgitation, mild mitral regurgitation. Management per cardiology.      Estimated  body mass index is 22.79 kg/m as calculated from the following:   Height as of this encounter: 5\' 9"  (1.753 m).   Weight as of this encounter: 70 kg.   DVT prophylaxis: Heparin Gtt Code Status: Full code Family Communication: care discussed with patient  Disposition Plan:  Status is: Inpatient  Remains inpatient appropriate because: management of critical limb ischemia. Resp failure  CIR when bed available        Consultants:  Nephrology Cardiology Vascular.  Pulmonologist   Procedures:    Antimicrobials:    Subjective: Asking for meds for constipation.  Breathing ok.   Objective: Vitals:   09/03/21 2325 09/04/21 0410 09/04/21 0754 09/04/21 0929  BP: 118/77 117/78 114/71 102/69  Pulse: (!) 106 (!) 105 (!) 106 (!) 107  Resp: 18 18 17 20   Temp: 98.4 F (36.9 C) 98.5 F (36.9 C) 98.2 F (36.8 C)   TempSrc: Oral Oral Oral   SpO2: 100% 100% 100% 100%  Weight:  70 kg    Height:        Intake/Output Summary (Last 24 hours) at 09/04/2021 1459 Last data filed at 09/04/2021 1243 Gross per 24 hour  Intake 478.08 ml  Output --  Net 478.08 ml    Filed Weights   09/03/21 0802 09/03/21 1140 09/04/21 0410  Weight: 70.8 kg 69.8 kg 70 kg    Examination:  General exam: Chronic ill appearing Respiratory system: BL crackles.  Cardiovascular system: S 1, S 2 Systolic Murmur Gastrointestinal system: BS present, soft, nt Central nervous system: Alert, follows command Extremities: R BKA , left SP BKA     Data Reviewed: I have personally reviewed following labs and imaging studies  CBC: Recent Labs  Lab 08/30/21 0652 09/01/21 0159 09/02/21 0718 09/03/21 0305 09/04/21 0219  WBC 8.4 7.2 5.6 8.3 8.6  HGB 10.4* 8.3* 8.3* 8.1* 8.7*  HCT 33.1* 26.4* 26.5* 26.7* 27.8*  MCV 97.6 96.0 97.1 97.8 98.2  PLT 287 238 223 228 829    Basic Metabolic Panel: Recent Labs  Lab 08/29/21 1629 08/31/21 0145 09/01/21 0159 09/02/21 0718 09/03/21 0305  NA 133* 131*  129* 132* 133*  K 3.7 3.9 3.9 3.5 4.0  CL 98 94* 95* 95* 96*  CO2 24 24 24 25 25   GLUCOSE 144* 96 88 89 85  BUN 38* 30* 39* 25* 33*  CREATININE 5.82* 5.01* 6.37* 4.56* 5.41*  CALCIUM 7.3* 7.6* 7.7* 8.1* 8.0*  PHOS 3.2  --   --   --   --     GFR: Estimated Creatinine Clearance: 13.3 mL/min (A) (by C-G formula based on SCr of 5.41 mg/dL (H)). Liver Function Tests: Recent Labs  Lab 08/29/21 1629  ALBUMIN 1.9*    No results for input(s): LIPASE, AMYLASE in the last 168 hours. No results for input(s): AMMONIA in the last 168 hours. Coagulation Profile: No results for input(s): INR, PROTIME in the last 168 hours. Cardiac Enzymes: No results for input(s): CKTOTAL, CKMB, CKMBINDEX, TROPONINI in the last 168 hours. BNP (last 3 results) No results for input(s): PROBNP in the last 8760 hours. HbA1C: No results for input(s): HGBA1C in the last 72 hours. CBG: No results for input(s): GLUCAP in the last 168 hours.  Lipid Profile: No results for input(s): CHOL, HDL, LDLCALC, TRIG, CHOLHDL, LDLDIRECT in the last 72 hours.  Thyroid Function Tests: No results for input(s): TSH, T4TOTAL, FREET4, T3FREE, THYROIDAB  in the last 72 hours. Anemia Panel: No results for input(s): VITAMINB12, FOLATE, FERRITIN, TIBC, IRON, RETICCTPCT in the last 72 hours. Sepsis Labs: No results for input(s): PROCALCITON, LATICACIDVEN in the last 168 hours.  No results found for this or any previous visit (from the past 240 hour(s)).       Radiology Studies: IR US CHEST  Result Date: 09/03/2021 CLINICAL DATA:  Loculated right lateral pleural effusion by CT. EXAM: CHEST ULTRASOUND COMPARISON:  CT of the chest on 08/31/2021 FINDINGS: Ultrasound today demonstrates only a small amount of right pleural fluid which cannot be accessed for thoracentesis. IMPRESSION: Small amount of right pleural fluid by ultrasound. A large enough pocket was not identified to allow for thoracentesis today. Electronically Signed   By:  Aletta Edouard M.D.   On: 09/03/2021 16:14        Scheduled Meds:  apixaban  5 mg Oral BID   atorvastatin  10 mg Oral QHS   Chlorhexidine Gluconate Cloth  6 each Topical Q0600   cinacalcet  60 mg Oral Q supper   Darbepoetin Alfa  100 mcg Intravenous Q Tue-HD   diltiazem  120 mg Oral Daily   docusate sodium  100 mg Oral TID   gabapentin  100 mg Oral BID   hydrocortisone  25 mg Rectal BID   metoprolol tartrate  37.5 mg Oral BID   midodrine  10 mg Oral TID WC   pantoprazole  40 mg Oral Daily   polyethylene glycol  17 g Oral Daily   senna  2 tablet Oral QHS   sertraline  100 mg Oral Daily   sevelamer carbonate  2.4 g Oral TID WC   sorbitol  30 mL Oral Once   Continuous Infusions:  sodium chloride     ferric gluconate (FERRLECIT) IVPB Stopped (09/03/21 1900)     LOS: 5 days    Time spent: 35 Minutes.     Elmarie Shiley, MD [facility]   If 7PM-7AM, please contact night-coverage [email or web address]  09/04/2021, 2:59 PM

## 2021-09-04 NOTE — Progress Notes (Signed)
Inpatient Rehab Admissions Coordinator:   Spoke with patient over the phone to discuss return to CIR.  I reiterated 3 hrs/day of therapy and likely ~2 week length of stay.  Pt upset by estimated length of stay, stating he was told that he would be in rehab until he got his prosthetics.  I let him know that inpatient/hospital rehab would likely only be 2, maybe 3, weeks and would involve d/c home at w/c level.  I shared that SNF may be able to offer longer length of stay, though it was outside my knowledge set.  He is open to discussing SNF with CSW.  We will have a bed for this patient, should he choose to return.  I will not return until Tuesday, but one of my colleagues will follow up.    Shann Medal, PT, DPT Admissions Coordinator [phone] 09/04/21  3:34 PM

## 2021-09-04 NOTE — Progress Notes (Signed)
[facility] Progress Note   66 y.o. male PMH COPD Barrett's, hyperparathyroidism, ESRD, failed renal transplant, PAF on Eliquis p/w ischemic foot ulcer -> BKA on 12/30.  Patient receives his dialysis treatments at [place] with [facility].   Dialysis at [facility] with [facility] EDW 68.4kg? EDW before the amputation was 72kg     Assessment/ Plan:   ESRD - TTS at [facility] - establishing new EDW  - HD set up for MWF outpt now, though this may further change, see below. Due to staffing-  his HD got put off to Tuesday  - so will keep on TTS schedule for now -  next HD  Saturday   -  Uses elisio dialyzer at [place] but has been tolerating our standard optiflux dialyzer here  [name]- [name] 2.4 grams packet TIDM and 1600 with snacks-  have decreased snack dose given phos of 3 . [name] 60mg  qdaily as well.  Will need to check phos again at some point  Anemia - on ESA and s/p iron load.  Required transfusion this hosp.  ^ ESA dose. Hgb in the 8's for now-  likely to go down with surgery PAD with critical limb ischemia s/p rt BKA for gangrene - now will require LBKA -  done 1/25  PAF - rate controlled and  Eliquis typically. On dilt and metop - cont even prior to dialysis.  Was having some RVR periodically during this admission and cardiology has been consulted - titrating BB and watching BP.  He's on midodrine now as well.  GERD COPD   9. Dispo: private transport to [place] HD unit from his [place] home too costly, will switch from [name] to [facility]- MWF-  has spot.   Now that he's having a 2nd amputation he will likely go to SNF and plans may need to change further depending on location of SNF.   Subjective:    HD yest-  removed 1000-  tolerated well-  eating lunch says pain is under control-  right BKA wound not healing that well-  they have mentioned the possibility of an AKA :(  BP 102/69    Pulse (!) 107    Temp 98.2 F (36.8 C)  (Oral)    Resp 20    Ht 5\' 9"  (1.753 m)    Wt 70 kg    SpO2 100%    BMI 22.79 kg/m   Intake/Output Summary (Last 24 hours) at 09/04/2021 1317 Last data filed at 09/04/2021 1243 Gross per 24 hour  Intake 478.08 ml  Output --  Net 478.08 ml   Weight change: 1.3 kg  Physical Exam: GEN: NAD, A&Ox3, NCAT HEENT: MMM LUNGS: CTA B/L no rales, rhonchi or wheezing CV: irregular irregular HR low 100s, crescendo murmur ABD: SNDNT +BS  EXT:R BKA-  edges of incision black, L new BKA-  wrapped  ACCESS: Lt BBT--> distal portion is aneurysmal +t/b  Imaging: IR US CHEST  Result Date: 09/03/2021 CLINICAL DATA:  Loculated right lateral pleural effusion by CT. EXAM: CHEST ULTRASOUND COMPARISON:  CT of the chest on 08/31/2021 FINDINGS: Ultrasound today demonstrates only a small amount of right pleural fluid which cannot be accessed for thoracentesis. IMPRESSION: Small amount of right pleural fluid by ultrasound. A large enough pocket was not identified to allow for thoracentesis today. Electronically Signed   By: Aletta Edouard M.D.   On: 09/03/2021 16:14    Labs: [employer] Recent Labs  Lab 08/29/21  1629 08/31/21 0145 09/01/21 0159 09/02/21 0718 09/03/21 0305  NA 133* 131* 129* 132* 133*  K 3.7 3.9 3.9 3.5 4.0  CL 98 94* 95* 95* 96*  CO2 24 24 24 25 25   GLUCOSE 144* 96 88 89 85  BUN 38* 30* 39* 25* 33*  CREATININE 5.82* 5.01* 6.37* 4.56* 5.41*  CALCIUM 7.3* 7.6* 7.7* 8.1* 8.0*  PHOS 3.2  --   --   --   --    CBC Recent Labs  Lab 09/01/21 0159 09/02/21 0718 09/03/21 0305 09/04/21 0219  WBC 7.2 5.6 8.3 8.6  HGB 8.3* 8.3* 8.1* 8.7*  HCT 26.4* 26.5* 26.7* 27.8*  MCV 96.0 97.1 97.8 98.2  PLT 238 223 228 200    Medications:     atorvastatin  10 mg Oral QHS   Chlorhexidine Gluconate Cloth  6 each Topical Q0600   cinacalcet  60 mg Oral Q supper   Darbepoetin Alfa  100 mcg Intravenous Q Tue-HD   diltiazem  120 mg Oral Daily   docusate sodium  100 mg Oral TID   gabapentin  100 mg Oral  BID   hydrocortisone  25 mg Rectal BID   metoprolol tartrate  37.5 mg Oral BID   midodrine  10 mg Oral TID WC   pantoprazole  40 mg Oral Daily   polyethylene glycol  17 g Oral Daily   senna  2 tablet Oral QHS   sertraline  100 mg Oral Daily   sevelamer carbonate  2.4 g Oral TID WC   sorbitol  30 mL Oral Once    [facility] Pager [phone]

## 2021-09-04 NOTE — Progress Notes (Signed)
This chaplain responded to the RN-David request for Pt. F/U spiritual care.  The chaplain understands the Pt. is expressing verbal distress about the length of rehab and the healing of his right BKA.   The chaplain offered assistance and a reflective listening presence as the Pt. navigated the [place] and his request for peer support. The chaplain will F/U with the Pt. on the application on Monday.    The chaplain understands the Pt. is positively anticipating a visit from a cousin traveling from [place] on Saturday. The Pt. clarifies his family consists of male cousins because he is an only child.  Chaplain Sallyanne Kuster [phone]

## 2021-09-04 NOTE — Progress Notes (Addendum)
°  Progress Note    09/04/2021 7:45 AM 2 Days Post-Op  Subjective:  emotional about nonhealing R BKA   Vitals:   09/03/21 2325 09/04/21 0410  BP: 118/77 117/78  Pulse: (!) 106 (!) 105  Resp: 18 18  Temp: 98.4 F (36.9 C) 98.5 F (36.9 C)  SpO2: 100% 100%   Physical Exam: Lungs:  non labored Incisions:  pictured below Neurologic: A&O     CBC    Component Value Date/Time   WBC 8.6 09/04/2021 0219   RBC 2.83 (L) 09/04/2021 0219   HGB 8.7 (L) 09/04/2021 0219   HGB 11.4 (L) 06/11/2012 0501   HCT 27.8 (L) 09/04/2021 0219   HCT 34.6 (L) 06/11/2012 0501   PLT 200 09/04/2021 0219   PLT 251 06/11/2012 0501   MCV 98.2 09/04/2021 0219   MCV 95 06/11/2012 0501   MCH 30.7 09/04/2021 0219   MCHC 31.3 09/04/2021 0219   RDW 18.3 (H) 09/04/2021 0219   RDW 14.4 06/11/2012 0501   LYMPHSABS 0.8 08/26/2021 1004   LYMPHSABS 1.2 06/11/2012 0501   MONOABS 0.9 08/26/2021 1004   MONOABS 0.7 06/11/2012 0501   EOSABS 0.0 08/26/2021 1004   EOSABS 0.2 06/11/2012 0501   BASOSABS 0.0 08/26/2021 1004   BASOSABS 0.1 06/11/2012 0501    BMET    Component Value Date/Time   NA 133 (L) 09/03/2021 0305   NA 142 06/10/2012 0924   K 4.0 09/03/2021 0305   K 4.5 06/16/2013 1315   CL 96 (L) 09/03/2021 0305   CL 102 06/10/2012 0924   CO2 25 09/03/2021 0305   CO2 29 06/10/2012 0924   GLUCOSE 85 09/03/2021 0305   GLUCOSE 83 06/10/2012 0924   BUN 33 (H) 09/03/2021 0305   BUN 35 (H) 06/10/2012 0924   CREATININE 5.41 (H) 09/03/2021 0305   CREATININE 10.29 (H) 06/10/2012 0924   CALCIUM 8.0 (L) 09/03/2021 0305   CALCIUM 9.2 06/10/2012 0924   GFRNONAA 11 (L) 09/03/2021 0305   GFRNONAA 5 (L) 06/10/2012 0924   GFRAA 9 (L) 09/18/2019 0430   GFRAA 6 (L) 06/10/2012 0924    INR    Component Value Date/Time   INR 1.7 (H) 08/05/2021 1846     Intake/Output Summary (Last 24 hours) at 09/04/2021 0745 Last data filed at 09/03/2021 2200 Gross per 24 hour  Intake 177.14 ml  Output 1000 ml  Net  -822.86 ml     Assessment/Plan:  66 y.o. male is s/p L BKA 2 Days Post-Op   Dressing changed L BKA; seems to be healing well; stump sock ordered Large necrotic area of R BKA mid incision; patient is aware he may require revision to AKA PT/OT to evaluate today Ok for d/c when placement approved     Dagoberto Ligas, PA-C [facility] [phone] 09/04/2021 7:45 AM  I agree with the above.  I have seen and evaluated the patient.  His left BKA dressing was changed today and this is healing appropriately.  He has ischemic skin changes to the right BKA.  We discussed the possibility of a more proximal amputation, however he is not interested in additional surgery at this time.  I think it is reasonable to continue to allow this to demarcate.  My team will check back on the patient on Monday.  Annamarie Major

## 2021-09-04 NOTE — Progress Notes (Signed)
ANTICOAGULATION CONSULT NOTE  Pharmacy Consult for  Heparin Indication: atrial fibrillation Brief A/P: Heparin level subtherapeutic Increase Heparin rate  No Known Allergies  Patient Measurements: Height: 5\' 9"  (175.3 cm) Weight: 69.8 kg (153 lb 14.1 oz) IBW/kg (Calculated) : 70.7 Heparin Dosing Weight: 74.6 kg  Vital Signs: Temp: 98.4 F (36.9 C) (01/26 2325) Temp Source: Oral (01/26 2325) BP: 118/77 (01/26 2325) Pulse Rate: 106 (01/26 2325)  Labs: Recent Labs    09/02/21 0718 09/03/21 0305 09/03/21 1832 09/04/21 0219  HGB 8.3* 8.1*  --  8.7*  HCT 26.5* 26.7*  --  27.8*  PLT 223 228  --  200  APTT 93*  --   --   --   HEPARINUNFRC 0.51  --  0.27* 0.22*  CREATININE 4.56* 5.41*  --   --      Estimated Creatinine Clearance: 13.3 mL/min (A) (by C-G formula based on SCr of 5.41 mg/dL (H)).   Assessment: 66 y.o. male with h/o Afib, Eliquis on hold, for heparin  Goal of Therapy:  Heparin level 0.3-0.7 units/ml aPTT 66-102 seconds Monitor platelets by anticoagulation protocol: Yes   Plan:  Increase Heparin 2150 units/hr Check heparin level in 8 hours.  Phillis Knack, PharmD, BCPS 09/04/2021 4:02 AM

## 2021-09-04 NOTE — Progress Notes (Addendum)
NAME:  Scott Castro, MRN:  [id number], DOB:  1956-08-03, LOS: 5 ADMISSION DATE:  08/30/2021, CONSULTATION DATE:  08/31/21 REFERRING MD:  Dr Tyrell Antonio, CHIEF COMPLAINT: Abnormal chest x-ray  History of Present Illness:  66 yo M PMH ESRD, HTN, Afib on eliquis, COPD with chronic hypoxic respiratory failure on 2-3LNC, PAD/critical limb ischemia BLE s/p R BKA (08/07/21) during hospitalization at [facility] 12/28-1/4, who presented to [facility] from inpt rehab on 08/30/21 for L BKA. Admitted to [facility] and planned for L BKA 1/24. On 1/23 a CXR was obtained which revealed R>L pleural effusions. PCCM was consulted in this setting    In review of prior imaging and records: CT chest performed 07/29/21 which had increase in size of L sided loculated appearing pleural fluid, R sided loculated appearing pleural effusion, pleural thickening, R sided subpleural scarring, enlarged lymph nodes (15mmR paratracheal node, 45mm subcarinal, scattered throughout chest), small pericardial effusion.    Bilateral pleural effusions were noted on 2020 HRCT, at which time their appearance was felt c/w asbestosis   Pertinent  Medical History   Past Medical History:  Diagnosis Date   Barrett's esophagus    Chronic kidney disease    CHRONIC\   Glomerulosclerosis, focal    Hyperparathyroidism due to renal insufficiency (HCC)   End-stage renal disease on hemodialysis Chronic bilateral pleural effusion Chronic obstructive pulmonary disease Chronic hypoxemic respiratory failure Atrial fibrillation on Eliquis Critical limb ischemia S/p right BKA Hypertension Pericardial effusion  Significant Hospital Events: Including procedures, antibiotic start and stop dates in addition to other pertinent events   1/22 admitted from CIR with critical limb ischemia 1/23 PCCM consulted for bilateral effusions 1/24 tolerated dialysis well 1/25 plan for or today for BKA- tolerated BKA well 1/26 ultrasound did not reveal significant fluid pocket that  could be accessed for thoracentesis  Interim History / Subjective:  Denies significant complaints Breathing feels the same Lower extremity pain is overall better  Objective   Blood pressure 102/69, pulse (!) 107, temperature 98.2 F (36.8 C), temperature source Oral, resp. rate 20, height 5\' 9"  (1.753 m), weight 70 kg, SpO2 100 %.        Intake/Output Summary (Last 24 hours) at 09/04/2021 1114 Last data filed at 09/03/2021 2200 Gross per 24 hour  Intake 177.14 ml  Output 1000 ml  Net -822.86 ml   Filed Weights   09/03/21 0802 09/03/21 1140 09/04/21 0410  Weight: 70.8 kg 69.8 kg 70 kg    Examination: General: Chronically ill-appearing HENT: Moist oral mucosa Lungs: Decreased air movement bilaterally Cardiovascular: S1-S2 appreciated Abdomen: Soft, bowel sounds appreciated  extremities: Bilateral BKA  Echocardiogram with normal left ventricular function, reduced right ventricular systolic function, moderate pulmonary hypertension  Resolved Hospital Problem list     Assessment & Plan:   Loculated pleural effusion -No significant pocket of fluid that can be accessed for thoracentesis -Will require radiological follow-up -Patient not having any symptoms to suggest an infectious process at present -Continue to manage without antibiotics  Moderate pulmonary hypertension -Related to underlying lung disease and probably chronic hypoxemia -Class V pulmonary hypertension from renal disease probably contributing  Underlying concern for asbestos related lung disease, asbestos related pleural effusion based on previous CT scan findings -Worked at [employer], 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 to him, was not in [employer] CT scan findings consistent with rounded atelectasis which usually is seen in previous asbestos exposure-progressive disease  Chronic obstructive pulmonary disease -Continue bronchodilators  Critical limb  ischemia -Unfortunately has needed bilateral BKA  Atrial fibrillation -On heparin at present, on Eliquis as outpatient  End-stage renal disease -Hemodialysis per nephrology  History of hypertension Barrett's esophagus-on PPI  Strongly encouraged to follow-up as outpatient with a CT scan -Previous follow-up was performed [facility] clinic  Pulmonary will sign off at present Call back if needed  Sherrilyn Rist, MD [place] PCCM Pager: See Shea Evans

## 2021-09-04 NOTE — H&P (Incomplete)
Physical Medicine and Rehabilitation Admission H&P    CC: Debility secondary to bilateral lower extremity amputations  HPI: Scott Castro is a 66 year old male with a history of peripheral vascular disease who underwent right below the knee amputation secondary to intractable right foot pain and right lower extremity critical limb ischemia by Dr. Lucky Cowboy on 08/07/2021.  Prior to amputation, he underwent arteriogram by Dr. Lucky Cowboy on 08/06/2021 with right TP trunk and posterior tibial artery angioplasty as well as stent placement to the right external iliac artery.  He is maintained on aspirin 81 mg daily as well as statin.  He was admitted to inpatient rehabilitation on 08/12/2021.  Unfortunately, he developed rest pain of the left foot.  Vascular consultation was obtained and arteriogram of the left lower extremity performed on 08/28/2021 by Dr. [name].  His only tibial vessel was the anterior tibial artery which occluded to the ankle.  Remaining tibial arteries were occluded and there were no options for revascularization.  He then underwent left below the knee amputation on 09/02/2021 by Dr. Trula Slade. The patient requires inpatient medicine and rehabilitation evaluations and services for ongoing dysfunction secondary to bilateral lower extremity amputations.  The patient's past medical history significant for end-stage renal disease on chronic intermittent hemodialysis on Tuesdays, Thursdays and Saturdays as an outpatient followed by [facility] nephrology, Dr. Radene Knee.  He has a left upper extremity AV fistula.   He has a history of atrial fibrillation maintained on apixaban, metoprolol and diltiazem.  Also has history of COPD and has been on home oxygen.  Recently developed bilateral pleural effusions and underwent pulmonary consultation with CT of the chest.  Consolidation of left lower lobe noted and recommend capital PET scan as outpatient.  Underwent kidney transplantation approximately 1985.  His kidney failed in  the early 2000's.  He underwent peritoneal dialysis catheter placement and peritoneal dialysis prior to hemodialysis.  Patient denies tobacco use present or prior.  He lives alone in a single-story home.  ROS Past Medical History:  Diagnosis Date   Barrett's esophagus    Chronic kidney disease    CHRONIC\   Glomerulosclerosis, focal    Hyperparathyroidism due to renal insufficiency [facility])    Past Surgical History:  Procedure Laterality Date   ABDOMINAL AORTOGRAM W/LOWER EXTREMITY N/A 08/28/2021   Procedure: ABDOMINAL AORTOGRAM W/LOWER EXTREMITY;  Surgeon: Cherre Robins, MD;  Location: [facility] CV LAB;  Service: Cardiovascular;  Laterality: N/A;   AMPUTATION Right 08/07/2021   Procedure: AMPUTATION BELOW KNEE;  Surgeon: Algernon Huxley, MD;  Location: [facility] ORS;  Service: Vascular;  Laterality: Right;   AMPUTATION Left 09/02/2021   Procedure: LEFT BELOW KNEE AMPUTATION;  Surgeon: Serafina Mitchell, MD;  Location: [place];  Service: Vascular;  Laterality: Left;   AV FISTULA PLACEMENT     COLONOSCOPY N/A 03/22/2016   Procedure: COLONOSCOPY;  Surgeon: Manya Silvas, MD;  Location: [facility] ENDOSCOPY;  Service: Endoscopy;  Laterality: N/A;   DG AV DIALYSIS GRAFT DECLOT OR     ESOPHAGOGASTRODUODENOSCOPY (EGD) WITH PROPOFOL  03/22/2016   Procedure: ESOPHAGOGASTRODUODENOSCOPY (EGD) WITH PROPOFOL;  Surgeon: Manya Silvas, MD;  Location: [facility] ENDOSCOPY;  Service: Endoscopy;;   FLEXIBLE BRONCHOSCOPY     KIDNEY TRANSPLANT Right 1985   LOWER EXTREMITY ANGIOGRAPHY Right 07/20/2021   Procedure: Lower Extremity Angiography;  Surgeon: Algernon Huxley, MD;  Location: [place] CV LAB;  Service: Cardiovascular;  Laterality: Right;   LOWER EXTREMITY ANGIOGRAPHY Right 08/06/2021   Procedure: Lower Extremity Angiography;  Surgeon: Algernon Huxley, MD;  Location: [place] CV LAB;  Service: Cardiovascular;  Laterality: Right;   REMOVAL TENCKHOFF CATH     Family History  Problem Relation Age of Onset    Heart disease Mother    Heart disease Father    Social History:  reports that he has never smoked. He has never used smokeless tobacco. He reports that he does not currently use alcohol. He reports that he does not use drugs. Allergies: No Known Allergies Medications Prior to Admission  Medication Sig Dispense Refill   albuterol (VENTOLIN HFA) 108 (90 Base) MCG/ACT inhaler Inhale 2 puffs into the lungs every 6 (six) hours as needed for wheezing or shortness of breath.     apixaban (ELIQUIS) 5 MG TABS tablet Take 1 tablet (5 mg total) by mouth 2 (two) times daily. 60 tablet 0   atorvastatin (LIPITOR) 10 MG tablet Take 1 tablet (10 mg total) by mouth at bedtime. 30 tablet 0   cinacalcet (SENSIPAR) 60 MG tablet Take 60 mg by mouth daily.     diclofenac Sodium (VOLTAREN) 1 % GEL Apply 2 g topically 4 (four) times daily. Use on feet     diltiazem (CARDIZEM CD) 120 MG 24 hr capsule Take 1 capsule (120 mg total) by mouth daily. Hold for sbp < 100     feeding supplement (ENSURE ENLIVE / ENSURE PLUS) LIQD Take 237 mLs by mouth 2 (two) times daily between meals. 237 mL 12   gabapentin (NEURONTIN) 100 MG capsule Take 1 capsule (100 mg total) by mouth 2 (two) times daily.     HYDROcodone-acetaminophen (NORCO) 7.5-325 MG tablet Take 1 tablet by mouth 3 (three) times daily as needed.     methocarbamol (ROBAXIN) 500 MG tablet Take 1 tablet (500 mg total) by mouth every 8 (eight) hours as needed for muscle spasms.     metoprolol tartrate (LOPRESSOR) 25 MG tablet Take 0.5 tablets (12.5 mg total) by mouth 2 (two) times daily.     pantoprazole (PROTONIX) 40 MG tablet Take 40 mg by mouth daily.     polyethylene glycol (MIRALAX / GLYCOLAX) 17 g packet Take 17 g by mouth daily as needed. 14 each 0   senna-docusate (SENOKOT-S) 8.6-50 MG tablet Take 2 tablets by mouth 2 (two) times daily.     sevelamer carbonate (RENVELA) 800 MG tablet Take by mouth See admin instructions. Take 2400 mg by mouth three times daily, 1600  mg with snacks      Drug Regimen Review { DRUG REGIMEN [id number]}  Home: Home Living Family/patient expects to be discharged to:: Private residence Living Arrangements: Alone Available Help at Discharge: Friend(s), Neighbor, Available PRN/intermittently Type of Home: House Home Access: Ramped entrance Home Layout: One level Bathroom Shower/Tub: Chiropodist: Standard   Functional History: Prior Function Prior Level of Function : Needs assist Mobility Comments: Was practicing transfers with RW and therapy staff. ADLs Comments: Was working on ADLs with therapists and staff. Reaching Min A level  Functional Status:  Mobility: Bed Mobility Overal bed mobility: Needs Assistance Bed Mobility: Supine to Sit Supine to sit: Supervision, +2 for safety/equipment General bed mobility comments: Supervision Transfers Overall transfer level: Needs assistance Equipment used: Sliding board Transfers: Bed to chair/wheelchair/BSC Bed to/from chair/wheelchair/BSC transfer type:: Lateral/scoot transfer  Lateral/Scoot Transfers: Min guard, +2 safety/equipment, With slide board General transfer comment: pt directed in use of slide board and was able to perform with minA to balance and adjust position.  ADL: ADL Overall ADL's : Needs assistance/impaired Eating/Feeding: Set up, Sitting Grooming: Set up, Sitting Upper Body Bathing: Set up, Supervision/ safety, Sitting Lower Body Bathing: Moderate assistance, Sitting/lateral leans Upper Body Dressing : Supervision/safety, Set up, Sitting Lower Body Dressing: Moderate assistance, Sitting/lateral leans Toilet Transfer: Minimal assistance, +2 for safety/equipment, Transfer board (simluated to recliner) Functional mobility during ADLs: Minimal assistance, +2 for safety/equipment (lateral scoot)  Cognition: Cognition Overall Cognitive Status: Within Functional Limits for tasks assessed Orientation Level: Oriented  X4 Cognition Arousal/Alertness: Awake/alert Behavior During Therapy: WFL for tasks assessed/performed Overall Cognitive Status: Within Functional Limits for tasks assessed  Physical Exam: Blood pressure 114/71, pulse (!) 106, temperature 98.2 F (36.8 C), temperature source Oral, resp. rate 17, height 5\' 9"  (1.753 m), weight 70 kg, SpO2 100 %. Physical Exam  Results for orders placed or performed during the hospital encounter of 08/30/21 (from the past 48 hour(s))  CBC     Status: Abnormal   Collection Time: 09/03/21  3:05 AM  Result Value Ref Range   WBC 8.3 4.0 - 10.5 K/uL   RBC 2.73 (L) 4.22 - 5.81 MIL/uL   Hemoglobin 8.1 (L) 13.0 - 17.0 g/dL   HCT 26.7 (L) 39.0 - 52.0 %   MCV 97.8 80.0 - 100.0 fL   MCH 29.7 26.0 - 34.0 pg   MCHC 30.3 30.0 - 36.0 g/dL   RDW 17.9 (H) 11.5 - 15.5 %   Platelets 228 150 - 400 K/uL   nRBC 0.0 0.0 - 0.2 %    Comment: Performed at [facility], [address] [place], [place] [address]  Basic metabolic panel     Status: Abnormal   Collection Time: 09/03/21  3:05 AM  Result Value Ref Range   Sodium 133 (L) 135 - 145 mmol/L   Potassium 4.0 3.5 - 5.1 mmol/L   Chloride 96 (L) 98 - 111 mmol/L   CO2 25 22 - 32 mmol/L   Glucose, Bld 85 70 - 99 mg/dL    Comment: Glucose reference range applies only to samples taken after fasting for at least 8 hours.   BUN 33 (H) 8 - 23 mg/dL   Creatinine, Ser 5.41 (H) 0.61 - 1.24 mg/dL   Calcium 8.0 (L) 8.9 - 10.3 mg/dL   GFR, Estimated 11 (L) >60 mL/min    Comment: (NOTE) Calculated using the CKD-EPI Creatinine Equation (2021)    Anion gap 12 5 - 15    Comment: Performed at [facility] [address] [place], [place] [address]  Heparin level (unfractionated)     Status: Abnormal   Collection Time: 09/03/21  6:32 PM  Result Value Ref Range   Heparin Unfractionated 0.27 (L) 0.30 - 0.70 IU/mL    Comment: (NOTE) The clinical reportable range upper limit is being lowered to >1.10 to align with the  FDA approved guidance for the current laboratory assay.  If heparin results are below expected values, and patient dosage has  been confirmed, suggest follow up testing of antithrombin III levels. Performed at [facility], [place] [address] [place] [address]   CBC     Status: Abnormal   Collection Time: 09/04/21  2:19 AM  Result Value Ref Range   WBC 8.6 4.0 - 10.5 K/uL   RBC 2.83 (L) 4.22 - 5.81 MIL/uL   Hemoglobin 8.7 (L) 13.0 - 17.0 g/dL   HCT 27.8 (L) 39.0 - 52.0 %   MCV 98.2 80.0 - 100.0 fL  MCH 30.7 26.0 - 34.0 pg   MCHC 31.3 30.0 - 36.0 g/dL   RDW 18.3 (H) 11.5 - 15.5 %   Platelets 200 150 - 400 K/uL   nRBC 0.0 0.0 - 0.2 %    Comment: Performed at [facility] [address] [place], [place] [address]  Heparin level (unfractionated)     Status: Abnormal   Collection Time: 09/04/21  2:19 AM  Result Value Ref Range   Heparin Unfractionated 0.22 (L) 0.30 - 0.70 IU/mL    Comment: (NOTE) The clinical reportable range upper limit is being lowered to >1.10 to align with the FDA approved guidance for the current laboratory assay.  If heparin results are below expected values, and patient dosage has  been confirmed, suggest follow up testing of antithrombin III levels. Performed at [facility], [place] [address], [place], [place] [address]    IR US CHEST  Result Date: 09/03/2021 CLINICAL DATA:  Loculated right lateral pleural effusion by CT. EXAM: CHEST ULTRASOUND COMPARISON:  CT of the chest on 08/31/2021 FINDINGS: Ultrasound today demonstrates only a small amount of right pleural fluid which cannot be accessed for thoracentesis. IMPRESSION: Small amount of right pleural fluid by ultrasound. A large enough pocket was not identified to allow for thoracentesis today. Electronically Signed   By: Aletta Edouard M.D.   On: 09/03/2021 16:14       Medical Problem List and Plan: 1. Functional deficits secondary to bilateral below the knee  amputations  -patient may *** shower  -ELOS/Goals: *** 2.  Antithrombotics: -DVT/anticoagulation:  Pharmaceutical: Other (comment) Eliquis  -antiplatelet therapy: aspirin 81 mg 3. Pain Management: oxycodone, Tylenol PRN; gabapentin 100 mg BID 4. Mood: LCSW to evaluate and provide emotional support. Continue Cymbalta  -antipsychotic agents: n/a 5. Neuropsych: This patient is capable of making decisions on his own behalf. 6. Skin/Wound Care: Routine skin checks  --Monitor right BKA skin flaps for breakdown and incision for drainage   --Monitor left BKA incision 7. Fluids/Electrolytes/Nutrition: Strict Is and Os.   --Fluid restriction secondary to ESRD on HD  --Daily weight 8.  Peripheral arterial disease: Resume aspirin 81 mg after heparin infusion stopped  --Continue Lipitor  -- Followed as outpatient by Dr.Dew 9. ESRD: On hemodialysis: inpatient Tuesday, Thursday and Saturday schedule for now.  -- Nephrology following 10: Atrial fibrillation: Continue Eliquis, Cardizem and Lopressor 11: Hyperlipidemia: Continue Lipitor 12: COPD/asthma/chronic respiratory failure with hypoxia --Continue albuterol HHN as needed --Stable on 2 L O2 supplemental  -- Oxygen dependency at home 13: Bilateral loculated pleural effusions: Pulmonology consulted  -- CT chest showed right loculated pleural effusion, RLL irregular consolidation  -- Not amenable to thoracentesis  -- Recommend PET scan as outpatient 14: Metabolic bone disease/renal osteodystrophy: Renvela, Sensipar as per nephrology 15: Barrett's esophagus: Continue PPI 16: Anemia, iron deficiency/chronic disease: Continue Aransep and iron supplementation  -- Monitor CBC 17: Hypotension; intermittent: Monitor    ***  Barbie Banner, PA-C 09/04/2021

## 2021-09-04 NOTE — Social Work (Signed)
Received message form [facility]- patient has questions about CIR vs SNF- patient states he was under the impression he could stay longer in rehab than what he was informed. Patient states he thought he was going to long term care. CSW informed what was LTC and SNF. Patient is beginning to get more understanding of his options. CSW explained CIR and  SNF are short term rehab options. Patient asked for CSW advise, CSW informed, can not offer any advise,can only provide information. Patient states " I need all the physical therapy I can get". It appears maybe re-considering CIR. No other questions at this time. Advised to request SW if needed.   Thurmond Butts, MSW, LCSW Clinical Social Worker

## 2021-09-04 NOTE — PMR Pre-admission (Shared)
PMR Admission Coordinator Pre-Admission Assessment  Patient: Scott Castro is an 66 y.o., male MRN: [id number] DOB: 12/04/1955 Height: 5\' 9"  (175.3 cm) Weight: 70 kg  Insurance Information HMO:     PPO:      PCP:      IPA:      80/20:      OTHER:  PRIMARY: Medicare A/B      Policy#: [id number]            Subscriber: pt CM Name:       Phone#:      Fax#:  Pre-Cert#: verified Civil engineer, contracting:  Benefits:  Phone #:      Name:  Eff. Date: 06/09/02 A/B     Deduct: $1600      Out of Pocket Max: n/a      Life Max: n/a CIR: 100%      SNF: 20 full days Outpatient: 80%     Co-Ins: 20% Home Health: 100%      Co-Pay: DME: 80%     Co-Ins: 20% Providers: pt choice SECONDARY: BCBS      Policy#: [id number]     Phone#: [phone]  Financial Counselor:       Phone#:   The Data Collection Information Summary for patients in Inpatient Rehabilitation Facilities with attached Privacy Act [place] Records was provided and verbally reviewed with: Patient  Emergency Contact Information Contact Information     Name Relation Home Work Mobile   cox,kathy Relative   [phone]       Current Medical History  Patient Admitting Diagnosis: bilateral BKA  History of Present Illness:  Pt is a 66 year old male with medical hx significant for: PAD, ESRD on HD T,T,S, COPD on 2-3L [place], Barrett's esophagus, h/o multiple lung nodules, paroxysmal A-fib, ABI and angioplasty to right anterior tibial artery, tibial peroneal trunk and popliteal artery (07/20/21).   On 08/05/21, pt presented to [facility] d/t right foot pain and leg swelling. Pt  had worsening redness, blisters on right foot and toes. [facility] ultrasound of RLE negative for DVT and x-ray of foot shows severe soft tissue swelling. Vascular surgery performed angioplasty and PTCA right tibial peroneal trunk and right external iliac stenting on 08/06/21. Pt had right BKA on 08/07/21. He transitioned to [facility] on 08/12/21 and  began intensive rehabilitation.  During admission on rehab, he developed painful L great toe and underwent vascular workup.  Angiography on 1/20 revealed BL diffuse infrapopliteal disease of the LLE and recommended LBKA which he underwent on 1/25.  Noted to be hypoxic on 1/23 and cxr showed bilateral loculated pleural effusion.  Pulmonology consulted ad recommended CT chest (R loculated pleural effusion and RLL with irregular consolidation) and outpatient f/u for PET scan.  Vascular note necrotic area of incision on R residual limb and feel he will likely need revision to AKA though not this admission.  Therapy evaluation completed and pt was recommended for CIR.       [facility]'s [facility] record [facility] has been reviewed by the rehabilitation admission coordinator and physician.  Past Medical History  Past Medical History:  Diagnosis Date   Barrett's esophagus    Chronic kidney disease    CHRONIC\   Glomerulosclerosis, focal    Hyperparathyroidism due to renal insufficiency (HCC)     Has the patient had major surgery during 100 days prior to admission? Yes  Family History   family history includes Heart disease in his  father and mother.  Current Medications  Current Facility-Administered Medications:    0.9 %  sodium chloride infusion, 250 mL, Intravenous, PRN, Theda Sers, Emma M, PA-C   acetaminophen (TYLENOL) tablet 325-650 mg, 325-650 mg, Oral, Q4H PRN, Ulyses Amor, PA-C, 650 mg at 09/03/21 2221   albuterol (PROVENTIL) (2.5 MG/3ML) 0.083% nebulizer solution 3 mL, 3 mL, Inhalation, Q6H PRN, Theda Sers, Emma M, PA-C   apixaban (ELIQUIS) tablet 5 mg, 5 mg, Oral, BID, Regalado, Belkys A, MD, 5 mg at 09/04/21 1431   atorvastatin (LIPITOR) tablet 10 mg, 10 mg, Oral, QHS, Collins, Emma M, PA-C, 10 mg at 09/03/21 2217   bisacodyl (DULCOLAX) suppository 10 mg, 10 mg, Rectal, Daily PRN, Theda Sers, Emma M, PA-C   calcium carbonate (TUMS - dosed in mg elemental calcium) chewable tablet 200 mg  of elemental calcium, 1 tablet, Oral, QID PRN, Theda Sers, Emma M, PA-C   Chlorhexidine Gluconate Cloth 2 % PADS 6 each, 6 each, Topical, Q0600, Corliss Parish, MD, 6 each at 09/04/21 0931   cinacalcet (SENSIPAR) tablet 60 mg, 60 mg, Oral, Q supper, Laurence Slate M, PA-C, 60 mg at 09/03/21 1707   Darbepoetin Alfa (ARANESP) injection 100 mcg, 100 mcg, Intravenous, Q Tue-HD, Collins, Emma M, PA-C, 100 mcg at 09/01/21 1025   diltiazem (CARDIZEM CD) 24 hr capsule 120 mg, 120 mg, Oral, Daily, Collins, Emma M, PA-C, 120 mg at 09/04/21 0930   diphenhydrAMINE (BENADRYL) 12.5 MG/5ML elixir 12.5-25 mg, 12.5-25 mg, Oral, Q6H PRN, Theda Sers, Emma M, PA-C   docusate sodium (COLACE) capsule 100 mg, 100 mg, Oral, TID, Collins, Emma M, PA-C, 100 mg at 09/04/21 0931   ferric gluconate (FERRLECIT) 125 mg in sodium chloride 0.9 % 100 mL IVPB, 125 mg, Intravenous, Q T,Th,Sa-HD, Theda Sers, Emma M, PA-C, Stopped at 09/03/21 1900   gabapentin (NEURONTIN) capsule 100 mg, 100 mg, Oral, BID, Collins, Emma M, PA-C, 100 mg at 09/04/21 1607   guaiFENesin-dextromethorphan (ROBITUSSIN DM) 100-10 MG/5ML syrup 5-10 mL, 5-10 mL, Oral, Q6H PRN, Theda Sers, Emma M, PA-C   hydrocortisone (ANUSOL-HC) suppository 25 mg, 25 mg, Rectal, BID, Collins, Emma M, PA-C, 25 mg at 09/03/21 1232   metoprolol tartrate (LOPRESSOR) tablet 37.5 mg, 37.5 mg, Oral, BID, Laurence Slate M, PA-C, 37.5 mg at 09/04/21 0929   midodrine (PROAMATINE) tablet 10 mg, 10 mg, Oral, TID WC, Collins, Emma M, PA-C, 10 mg at 09/04/21 1431   milk and molasses enema, 1 enema, Rectal, PRN, Laurence Slate M, PA-C   oxyCODONE (Oxy IR/ROXICODONE) immediate release tablet 10 mg, 10 mg, Oral, Q4H PRN, Theda Sers, Emma M, PA-C, 10 mg at 09/03/21 2222   pantoprazole (PROTONIX) EC tablet 40 mg, 40 mg, Oral, Daily, Laurence Slate M, PA-C, 40 mg at 09/04/21 0931   polyethylene glycol (MIRALAX / GLYCOLAX) packet 17 g, 17 g, Oral, Daily, Laurence Slate M, PA-C, 17 g at 09/04/21 0932    prochlorperazine (COMPAZINE) tablet 5-10 mg, 5-10 mg, Oral, Q6H PRN **OR** [DISCONTINUED] prochlorperazine (COMPAZINE) injection 5-10 mg, 5-10 mg, Intramuscular, Q6H PRN **OR** [DISCONTINUED] prochlorperazine (COMPAZINE) suppository 12.5 mg, 12.5 mg, Rectal, Q6H PRN, Orma Flaming, MD   senna (SENOKOT) tablet 17.2 mg, 2 tablet, Oral, QHS, Collins, Emma M, PA-C, 17.2 mg at 09/03/21 2216   sertraline (ZOLOFT) tablet 100 mg, 100 mg, Oral, Daily, Laurence Slate M, PA-C, 100 mg at 09/04/21 0931   sevelamer carbonate (RENVELA) powder PACK 2.4 g, 2.4 g, Oral, TID WC, Collins, Emma M, PA-C, 2.4 g at 09/04/21 1456   sevelamer carbonate (RENVELA) tablet 800  mg, 800 mg, Oral, TID PRN, Ulyses Amor, PA-C, 800 mg at 09/03/21 0734   sorbitol 70 % solution 30 mL, 30 mL, Oral, Once, Regalado, Belkys A, MD   witch hazel-glycerin (TUCKS) pad, , Topical, PRN, Ulyses Amor, PA-C  Patients Current Diet:  Diet Order             Diet Heart Room service appropriate? Yes; Fluid consistency: Thin  Diet effective now                   Precautions / Restrictions Precautions Precautions: Fall Restrictions Weight Bearing Restrictions: Yes RLE Weight Bearing: Non weight bearing LLE Weight Bearing: Non weight bearing Other Position/Activity Restrictions: s/p R BKA   Has the patient had 2 or more falls or a fall with injury in the past year? 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 Independent  Indoor Mobility: Did the patient need assistance with walking from room to room (with or without device)? Independent  Stairs: Did the patient need assistance with internal or external stairs (with or without device)? Independent  Functional Cognition: Did the patient need help planning regular tasks such as shopping or remembering to take medications?  Independent  Patient Information Are you of Hispanic, Latino/a,or Spanish origin?: A. No, not of Hispanic, Latino/a, or Spanish origin What is your race?: A. White Do you need or want an interpreter to communicate with a doctor or health care staff?: 0. No  Patient's Response To:  Health Literacy and Transportation Is the patient able to respond to health literacy and transportation needs?: Yes Health Literacy - How often do you need to have someone help you when you read instructions, pamphlets, or other written material from your doctor or pharmacy?: Never In the past 12 months, has lack of transportation kept you from medical appointments or from getting medications?: Yes In the past 12 months, has lack of transportation kept you from meetings, work, or from getting things needed for daily living?: Yes  Home Assistive Devices / [place] Devices/Equipment: Wheelchair  Prior Device Use: Indicate devices/aids used by the patient prior to current illness, exacerbation or injury? None of the above  Current Functional Level Cognition  Overall Cognitive Status: Within Functional Limits for tasks assessed Orientation Level: Oriented X4 General Comments: Pt in poor spirits. Very down on himself, stating his body has always betrayed him.    Extremity Assessment (includes Sensation/Coordination)  Upper Extremity Assessment: Defer to OT evaluation  Lower Extremity Assessment: RLE deficits/detail, LLE deficits/detail RLE Deficits / Details: S/p R BKA LLE Deficits / Details: Planning for L amputation, multiple wounds covered in dressings on LLE, pt able to move against gravity    ADLs  Overall ADL's : Needs assistance/impaired Eating/Feeding: Set up, Sitting Grooming: Set up, Sitting Upper Body Bathing: Set up, Supervision/ safety, Sitting Lower Body Bathing: Moderate assistance, Sitting/lateral leans Upper Body Dressing : Supervision/safety, Set up, Sitting Lower Body  Dressing: Moderate assistance, Sitting/lateral leans Toilet Transfer: Minimal assistance, +2 for safety/equipment, Transfer board (simluated to recliner) Functional mobility during ADLs: Minimal assistance, +2 for safety/equipment (lateral scoot)    Mobility  Overal bed mobility: Needs Assistance Bed Mobility: Supine to Sit, Sit to Supine Supine to sit: HOB elevated, Max assist Sit to supine: Mod assist, HOB elevated General bed mobility comments: use of bed pad to  scoot hips toward EOB, assist to elevate trunk, pt able to assist by pulling up on bedrail    Transfers  Overall transfer level: Needs assistance Equipment used: Sliding board Transfers: Bed to chair/wheelchair/BSC Bed to/from chair/wheelchair/BSC transfer type:: Lateral/scoot transfer  Lateral/Scoot Transfers: Min guard, +2 safety/equipment, With slide board General transfer comment: pt directed in use of slide board and was able to perform with minA to balance and adjust position.    Ambulation / Gait / Stairs / Wheelchair Mobility       Posture / Balance Dynamic Sitting Balance Sitting balance - Comments: min guard static sit EOB Balance Overall balance assessment: Needs assistance Sitting-balance support: Feet unsupported, Single extremity supported, Bilateral upper extremity supported Sitting balance-Leahy Scale: Poor Sitting balance - Comments: min guard static sit EOB    Special needs/care consideration Dialysis: Hemodialysis Tuesday, Thursday, and Saturday, Oxygen yes, and Diabetic management yes   Previous Home Environment (from acute therapy documentation) Living Arrangements: Alone Available Help at Discharge: Friend(s), Neighbor, Available PRN/intermittently Type of Home: House Home Layout: One level Home Access: Ramped entrance Bathroom Shower/Tub: Chiropodist: Standard Home Care Services: No  Discharge Living Setting Plans for Discharge Living Setting: Patient's home Type of  Home at Discharge: House Discharge Home Layout: One level Discharge Home Access: Stairs to enter, Ramped entrance (per CSW documentation ramp finished prior to return to acute) Entrance Stairs-Rails: None Entrance Stairs-Number of Steps: 1 Discharge Bathroom Shower/Tub: Tub/shower unit Discharge Bathroom Toilet: Standard Discharge Bathroom Accessibility: Yes How Accessible: Accessible via walker Does the patient have any problems obtaining your medications?: No  Social/Family/Support Systems Anticipated Caregiver: Christia Reading Anticipated Caregiver's Contact Information: Christia Reading [phone]) cousin Ability/Limitations of Caregiver: planning to stay with pt initially at discharge prior to return to acute Caregiver Availability:  (24/7 at first, then intermittent) Discharge Plan Discussed with Primary Caregiver: Yes Is Caregiver In Agreement with Plan?: Yes Does Caregiver/Family have Issues with Lodging/Transportation while Pt is in Rehab?: No  Goals Patient/Family Goal for Rehab: PT/OT mod I w/c level Expected length of stay: 14-16 days Additional Information: pt VERY distraught over his current situation.  Would defnitely benefit from ongoing discussions with neuropsych and maybe 1:1 support Pt/Family Agrees to Admission and willing to participate: Yes Program Orientation Provided & Reviewed with Pt/Caregiver Including Roles  & Responsibilities: Yes  Barriers to Discharge: Decreased caregiver support, Medical stability, Wound Care  Decrease burden of Care through IP rehab admission: n/a  Possible need for SNF placement upon discharge: not anticipated  Patient Condition: I have reviewed medical records from [facility], spoken with  toc team , and patient. I discussed via phone for inpatient rehabilitation assessment.  Patient will benefit from ongoing PT and OT, can actively participate in 3 hours of therapy a day 5 days of the week, and can make measurable gains during the  admission.  Patient will also benefit from the coordinated team approach during an Inpatient Acute Rehabilitation admission.  The patient will receive intensive therapy as well as Rehabilitation physician, nursing, social worker, and care management interventions.  Due to safety, skin/wound care, disease management, medication administration, pain management, and patient education the patient requires 24 hour a day rehabilitation nursing.  The patient is currently max +2 with mobility and basic ADLs.  Discharge setting and therapy post discharge at home with home health is anticipated.  Patient has agreed to participate in the Acute Inpatient Rehabilitation Program and will admit *** .  Preadmission Screen Completed By: Shann Medal, PT,  DPT with day of admission updates by Michel Santee, 09/04/2021 3:13 PM ______________________________________________________________________   Discussed status with Dr. Marland Kitchen on *** at *** and received approval for admission today.  Admission Coordinator: Shann Medal, PT, DPT and  Michel Santee, PT, time Marland KitchenSudie Grumbling ***   Assessment/Plan: Diagnosis: Does the need for close, 24 hr/day Medical supervision in concert with the patient's rehab needs make it unreasonable for this patient to be served in a less intensive setting? {yes_no_potentially:3041433} Co-Morbidities requiring supervision/potential complications: *** Due to {due PY:1950932}, does the patient require 24 hr/day rehab nursing? {yes_no_potentially:3041433} Does the patient require coordinated care of a physician, rehab nurse, PT, OT, and SLP to address physical and functional deficits in the context of the above medical diagnosis(es)? {yes_no_potentially:3041433} Addressing deficits in the following areas: {deficits:3041436} Can the patient actively participate in an intensive therapy program of at least 3 hrs of therapy 5 days a week? {yes_no_potentially:3041433} The potential for patient to make  measurable gains while on inpatient rehab is {potential:3041437} Anticipated functional outcomes upon discharge from inpatient rehab: {functional outcomes:304600100} PT, {functional outcomes:304600100} OT, {functional outcomes:304600100} SLP Estimated rehab length of stay to reach the above functional goals is: *** Anticipated discharge destination: {anticipated dc setting:21604} 10. Overall Rehab/Functional Prognosis: {potential:3041437}   MD Signature: ***

## 2021-09-04 NOTE — Progress Notes (Signed)
Progress Note  Patient Name: Scott Castro Date of Encounter: 09/04/2021  [facility] Cardiologist: None [facility])  Subjective   No cardiac complaints.  Does not have palpitations.  Breathing okay.  Inpatient Medications    Scheduled Meds:  atorvastatin  10 mg Oral QHS   Chlorhexidine Gluconate Cloth  6 each Topical Q0600   cinacalcet  60 mg Oral Q supper   Darbepoetin Alfa  100 mcg Intravenous Q Tue-HD   diltiazem  120 mg Oral Daily   docusate sodium  100 mg Oral TID   gabapentin  100 mg Oral BID   hydrocortisone  25 mg Rectal BID   metoprolol tartrate  37.5 mg Oral BID   midodrine  10 mg Oral TID WC   pantoprazole  40 mg Oral Daily   polyethylene glycol  17 g Oral Daily   senna  2 tablet Oral QHS   sertraline  100 mg Oral Daily   sevelamer carbonate  2.4 g Oral TID WC   Continuous Infusions:  sodium chloride     ferric gluconate (FERRLECIT) IVPB Stopped (09/03/21 1900)   heparin 2,150 Units/hr (09/04/21 0422)   PRN Meds: sodium chloride, acetaminophen, albuterol, bisacodyl, calcium carbonate, diphenhydrAMINE, guaiFENesin-dextromethorphan, milk and molasses, oxyCODONE, prochlorperazine **OR** [DISCONTINUED] prochlorperazine **OR** [DISCONTINUED] prochlorperazine, sevelamer carbonate, witch hazel-glycerin   Vital Signs    Vitals:   09/03/21 2325 09/04/21 0410 09/04/21 0754 09/04/21 0929  BP: 118/77 117/78 114/71 102/69  Pulse: (!) 106 (!) 105 (!) 106 (!) 107  Resp: 18 18 17 20   Temp: 98.4 F (36.9 C) 98.5 F (36.9 C) 98.2 F (36.8 C)   TempSrc: Oral Oral Oral   SpO2: 100% 100% 100% 100%  Weight:  70 kg    Height:        Intake/Output Summary (Last 24 hours) at 09/04/2021 1143 Last data filed at 09/03/2021 2200 Gross per 24 hour  Intake 177.14 ml  Output --  Net 177.14 ml   Last 3 Weights 09/04/2021 09/03/2021 09/03/2021  Weight (lbs) 154 lb 5.2 oz 153 lb 14.1 oz 156 lb 1.4 oz  Weight (kg) 70 kg 69.8 kg 70.8 kg      Telemetry    Fixed heart  rate at 106 bpm for days on and.- Personally Reviewed  ECG    08/31/2021 demonstrating probable atypical atrial flutter with 2-1 AV conduction with heart rate 113 bpm.- Personally Reviewed  Physical Exam  Chronically ill appearing older than stated age. GEN: No acute distress.   Neck: No JVD Cardiac: RRR, loud systolic murmur heard left mid sternal, left apical and into the left axilla. Respiratory: Clear to auscultation bilaterally. GI: Soft, nontender, non-distended  MS: Left below the knee amputation Neuro:  Nonfocal  Psych: Normal affect   Labs    High Sensitivity Troponin:  No results for input(s): TROPONINIHS in the last 720 hours.   Chemistry Recent Labs  Lab 08/29/21 1629 08/31/21 0145 09/01/21 0159 09/02/21 0718 09/03/21 0305  NA 133*   < > 129* 132* 133*  K 3.7   < > 3.9 3.5 4.0  CL 98   < > 95* 95* 96*  CO2 24   < > 24 25 25   GLUCOSE 144*   < > 88 89 85  BUN 38*   < > 39* 25* 33*  CREATININE 5.82*   < > 6.37* 4.56* 5.41*  CALCIUM 7.3*   < > 7.7* 8.1* 8.0*  ALBUMIN 1.9*  --   --   --   --  GFRNONAA 10*   < > 9* 13* 11*  ANIONGAP 11   < > 10 12 12    < > = values in this interval not displayed.    Lipids  Recent Labs  Lab 08/29/21 0451  CHOL 78  TRIG 68  HDL 32*  LDLCALC 32  CHOLHDL 2.4    Hematology Recent Labs  Lab 09/02/21 0718 09/03/21 0305 09/04/21 0219  WBC 5.6 8.3 8.6  RBC 2.73* 2.73* 2.83*  HGB 8.3* 8.1* 8.7*  HCT 26.5* 26.7* 27.8*  MCV 97.1 97.8 98.2  MCH 30.4 29.7 30.7  MCHC 31.3 30.3 31.3  RDW 17.4* 17.9* 18.3*  PLT 223 228 200   Thyroid No results for input(s): TSH, FREET4 in the last 168 hours.  BNPNo results for input(s): BNP, PROBNP in the last 168 hours.  DDimer No results for input(s): DDIMER in the last 168 hours.   Radiology    IR US CHEST  Result Date: 09/03/2021 CLINICAL DATA:  Loculated right lateral pleural effusion by CT. EXAM: CHEST ULTRASOUND COMPARISON:  CT of the chest on 08/31/2021 FINDINGS: Ultrasound  today demonstrates only a small amount of right pleural fluid which cannot be accessed for thoracentesis. IMPRESSION: Small amount of right pleural fluid by ultrasound. A large enough pocket was not identified to allow for thoracentesis today. Electronically Signed   By: Aletta Edouard M.D.   On: 09/03/2021 16:14    Cardiac Studies   2D Doppler echocardiogram 08/31/2021: IMPRESSIONS     1. Abnormal septal motion . Left ventricular ejection fraction, by  estimation, is 50 to 55%. The left ventricle has low normal function. The  left ventricle has no regional wall motion abnormalities. Left ventricular  diastolic parameters are  indeterminate.   2. Right ventricular systolic function is moderately reduced. The right  ventricular size is moderately enlarged. There is moderately elevated  pulmonary artery systolic pressure.   3. Left atrial size was severely dilated.   4. Right atrial size was severely dilated.   5. Calcified subchordal apparatus. The mitral valve is abnormal. Mild  mitral valve regurgitation. No evidence of mitral stenosis. Moderate  mitral annular calcification.   6. The tricuspid valve is abnormal. Tricuspid valve regurgitation is  severe.   7. The aortic valve is tricuspid. There is moderate calcification of the  aortic valve. Aortic valve regurgitation is not visualized. Aortic valve  sclerosis/calcification is present, without any evidence of aortic  stenosis.   8. The inferior vena cava is dilated in size with <50% respiratory  variability, suggesting right atrial pressure of 15 mmHg.    Patient Profile     66 y.o. male COPD, hypertension, hyperlipidemia, PAD with critical limb ischemia, atrial fibrillation and end-stage renal disease.  Status post left BKA January 2023. Assessment & Plan    Atrial flutter/ectopic atrial tachycardia with 2-1 AV conduction: Poorly controlled ventricular rate at 106 bpm.  History of atrial fibrillation.  No intervention needed at  this time.  If patient develops faster heart rates that affect hemodynamics, may need to consider additional medication and/or cardioversion. Chronic anticoagulation: The patient should remain on Eliquis because of high risk of embolic complications. Mitral regurgitation: He gives a history of rheumatic fever.  Echo confirms severe tricuspid regurgitation.  There is mild mitral regurgitation and mitral annular calcification. Critical limb ischemia with left BKA.  For questions or updates, please contact [place] Please consult [email or web address] for contact info under        Signed, Belva Crome  III, MD  09/04/2021, 11:43 AM

## 2021-09-05 NOTE — Progress Notes (Signed)
PROGRESS NOTE    Scott Castro  YFV:[id number] DOB: November 09, 1955 DOA: 08/30/2021 PCP: Franciso Bend, MD   Brief Narrative: 66 year old past medical history significant for ESRD on dialysis TTS, hypertension, hyperlipidemia, PAF, depression, chronic respiratory failure 2 L of oxygen, PAD recent right BKA on 08/07/2021 secondary to critical limb ischemia discharged to rehab.  He was seen by cardiology in inpatient rehab due to A. fib RVR and he was referred for inpatient telemetry admission.  Recent hospitalized at [facility] from 12/28 until 1//2023 right lower extremity critical limb ischemia status post BKA by vascular on 12/30.  Was just discharged to inpatient rehab here at [facility].  Found to be in RVR RVR admitted for further evaluation.  Patient  also had angiography 1/20 by Dr. [name] with BL diffuse infrapopliteal disease of the left lower extremity, vascular is recommending left BKA early this week.  Patient noted to be more hypoxic on 1/23, chest x-ray showed bilateral loculated pleural effusion, pulmonologist consulted for further evaluation. CT chest showed right loculated pleural effusion on right. Right lower lobe with irregular consolidation right lower lobe, he will need PET scan out patient.  Awaiting CIR admission.   Assessment & Plan:   Principal Problem:   Critical limb ischemia of left lower extremity/PAD  Active Problems:   COPD (chronic obstructive pulmonary disease) (HCC)   Chronic respiratory failure with hypoxia (HCC)   ESRD (end stage renal disease) (HCC)   Barrett esophagus   Anemia   Mixed hyperlipidemia   S/P BKA (below knee amputation), right (HCC)   Depressive reaction   AF (paroxysmal atrial fibrillation) (HCC)  1-Critical Limb ischemia of left lower extremity/PAD -Angiography 1/20 by Dr. Luan Pulling revealed diffuse infrapopliteal disease of the left with no revascularization options. -Recent BKA on the right 12/22 -Underwent  left BKA 1/25th Tolerated  procedure well.  Dressing  removed 1/27 Stable for transfer per vascular.   2-A. Fib Continue with  metoprolol and Cardizem.  Appreciate cardiology assistance.  Resume eliquis.   3-Acute on chronic hypoxic respiratory failure Loculated bilateral moderate pleural effusion Moderate Pulmonary HTN;  COPD -Pulmonologist consulted. -Incentive Spirometry ordered. -Continue with nebulizer as needed -CT chest 1/23: Patchy nodularity and ground-glass in the dependent RIGHT upper lobe and bilateral lower lobes raising the question of pneumonitis with distribution raising the question of sequela of aspiration. More dense consolidative changes and irregular bronchial structures in the RIGHT lower lobe central to an area of rounded atelectasis is of uncertain significance. Given the more mass like appearance in this area and increasing size of mediastinal lymph nodes over time a possibility of neoplasm is considered. PET scan may be helpful on follow-up in addition to contrasted imaging with CT for further assessment.  Loculated and mildly nodular appearing pleural fluid in the RIGHT chest with increase and with decreased pleural fluid in the LEFT chest compared to prior imaging.  -IR consulted for thoracentesis.  There was no fluid pocket identified that was safe to access by ultrasound thoracentesis. Stable on 2 L oxygen.  Needs follow up CT chest out patient.    4-ESRD on hemodialysis -S/p renal transplant [facility] with hemodialysis -Nephrology  following -Had HD  1/24.--1/26-1/28  Barrett esophagus: Continue with PPI  Anemia:  anemia of chronic disease: IV iron per nephrology  Hyperlipidemia: Continue with statins  Depression: Continue with Zoloft  Hyponatremia; fluid management with HD>    Severe tricuspid regurgitation, mild mitral regurgitation. Management per cardiology.     He  agrees to go to CIR  Estimated body mass index is 22.85 kg/m as calculated from the  following:   Height as of this encounter: 5\' 9"  (1.753 m).   Weight as of this encounter: 70.2 kg.   DVT prophylaxis: eliquis.  Code Status: Full code Family Communication: care discussed with patient  Disposition Plan:  Status is: Inpatient  Remains inpatient appropriate because: management of critical limb ischemia. Resp failure  CIR when bed available        Consultants:  Nephrology Cardiology Vascular.  Pulmonologist   Procedures:    Antimicrobials:    Subjective: Had bleeding in HD. Pressure apply to graft. Plan to check CBC tomorrow  Objective: Vitals:   09/05/21 1000 09/05/21 1030 09/05/21 1100 09/05/21 1127  BP: (!) 129/49 (!) 145/50 (!) 142/55 (!) 122/45  Pulse: (!) 106 (!) 106 (!) 108 (!) 108  Resp: 15 15 15 14   Temp:      TempSrc:      SpO2:      Weight:      Height:        Intake/Output Summary (Last 24 hours) at 09/05/2021 1300 Last data filed at 09/05/2021 0208 Gross per 24 hour  Intake 490 ml  Output 0 ml  Net 490 ml    Filed Weights   09/03/21 1140 09/04/21 0410 09/05/21 0554  Weight: 69.8 kg 70 kg 70.2 kg    Examination:  General exam: Chronic ill appearing.  Respiratory system: BL crackles.  Cardiovascular system: S 1, S 2 IRR systolic murmur Gastrointestinal system: BS present, soft nt Central nervous system: Alert, follows command Extremities: R BKA , left SP BKA     Data Reviewed: I have personally reviewed following labs and imaging studies  CBC: Recent Labs  Lab 08/30/21 0652 09/01/21 0159 09/02/21 0718 09/03/21 0305 09/04/21 0219  WBC 8.4 7.2 5.6 8.3 8.6  HGB 10.4* 8.3* 8.3* 8.1* 8.7*  HCT 33.1* 26.4* 26.5* 26.7* 27.8*  MCV 97.6 96.0 97.1 97.8 98.2  PLT 287 238 223 228 681    Basic Metabolic Panel: Recent Labs  Lab 08/29/21 1629 08/31/21 0145 09/01/21 0159 09/02/21 0718 09/03/21 0305  NA 133* 131* 129* 132* 133*  K 3.7 3.9 3.9 3.5 4.0  CL 98 94* 95* 95* 96*  CO2 24 24 24 25 25   GLUCOSE 144* 96  88 89 85  BUN 38* 30* 39* 25* 33*  CREATININE 5.82* 5.01* 6.37* 4.56* 5.41*  CALCIUM 7.3* 7.6* 7.7* 8.1* 8.0*  PHOS 3.2  --   --   --   --     GFR: Estimated Creatinine Clearance: 13.3 mL/min (A) (by C-G formula based on SCr of 5.41 mg/dL (H)). Liver Function Tests: Recent Labs  Lab 08/29/21 1629  ALBUMIN 1.9*    No results for input(s): LIPASE, AMYLASE in the last 168 hours. No results for input(s): AMMONIA in the last 168 hours. Coagulation Profile: No results for input(s): INR, PROTIME in the last 168 hours. Cardiac Enzymes: No results for input(s): CKTOTAL, CKMB, CKMBINDEX, TROPONINI in the last 168 hours. BNP (last 3 results) No results for input(s): PROBNP in the last 8760 hours. HbA1C: No results for input(s): HGBA1C in the last 72 hours. CBG: No results for input(s): GLUCAP in the last 168 hours.  Lipid Profile: No results for input(s): CHOL, HDL, LDLCALC, TRIG, CHOLHDL, LDLDIRECT in the last 72 hours.  Thyroid Function Tests: No results for input(s): TSH, T4TOTAL, FREET4, T3FREE, THYROIDAB in the last 72 hours. Anemia Panel:  No results for input(s): VITAMINB12, FOLATE, FERRITIN, TIBC, IRON, RETICCTPCT in the last 72 hours. Sepsis Labs: No results for input(s): PROCALCITON, LATICACIDVEN in the last 168 hours.  No results found for this or any previous visit (from the past 240 hour(s)).       Radiology Studies: IR US CHEST  Result Date: 09/03/2021 CLINICAL DATA:  Loculated right lateral pleural effusion by CT. EXAM: CHEST ULTRASOUND COMPARISON:  CT of the chest on 08/31/2021 FINDINGS: Ultrasound today demonstrates only a small amount of right pleural fluid which cannot be accessed for thoracentesis. IMPRESSION: Small amount of right pleural fluid by ultrasound. A large enough pocket was not identified to allow for thoracentesis today. Electronically Signed   By: Aletta Edouard M.D.   On: 09/03/2021 16:14        Scheduled Meds:  apixaban  5 mg Oral BID    atorvastatin  10 mg Oral QHS   Chlorhexidine Gluconate Cloth  6 each Topical Q0600   cinacalcet  60 mg Oral Q supper   Darbepoetin Alfa  100 mcg Intravenous Q Tue-HD   diltiazem  120 mg Oral Daily   docusate sodium  100 mg Oral TID   gabapentin  100 mg Oral BID   hydrocortisone  25 mg Rectal BID   metoprolol tartrate  37.5 mg Oral BID   midodrine  10 mg Oral TID WC   pantoprazole  40 mg Oral Daily   polyethylene glycol  17 g Oral Daily   senna  2 tablet Oral QHS   sertraline  100 mg Oral Daily   sevelamer carbonate  2.4 g Oral TID WC   sorbitol  30 mL Oral Once   Continuous Infusions:  sodium chloride     ferric gluconate (FERRLECIT) IVPB 125 mg (09/05/21 1118)     LOS: 6 days    Time spent: 35 Minutes.     Elmarie Shiley, MD [facility]   If 7PM-7AM, please contact night-coverage [email or web address]  09/05/2021, 1:00 PM

## 2021-09-05 NOTE — Progress Notes (Signed)
° °  Palliative Medicine Inpatient Follow Up Note   This Palliative Team provider went to offer emotional support to Scott Castro this morning around 8:18 though it appears he has been taken to hemodialysis.  Palliative care will continue to check in throughout the weekend.  ______________________________________________________________________________________ [place] Team Team Cell Phone: [phone] Please utilize secure chat with additional questions, if there is no response within 30 minutes please call the above phone number  Palliative Medicine Team providers are available by phone from 7am to 7pm daily and can be reached through the team cell phone.  Should this patient require assistance outside of these hours, please call the patient's attending physician.

## 2021-09-05 NOTE — Progress Notes (Signed)
Post hemodialysis increased bleeding after his arterial needle pulled.  Site held x 40 minutes to control bleeding.  Dr Moshe Cipro notified as well as floor nurse Rhoderick Moody.  Bleeding controlled and pt sent back to floor.  BP 147/76 HR 78.  Pt alert and oriented.

## 2021-09-05 NOTE — Progress Notes (Signed)
[facility] Progress Note   66 y.o. male PMH COPD Barrett's, hyperparathyroidism, ESRD, failed renal transplant, PAF on Eliquis p/w ischemic foot ulcer -> BKA on 12/30.  Patient receives his dialysis treatments at [place] with [facility].   Dialysis at [facility] with [facility] EDW 68.4kg? EDW before the amputation was 72kg     Assessment/ Plan:   ESRD - TTS at [facility] w/ [facility] - establishing new EDW  - HD set up for MWF outpt now, though this may further change, see below. Due to staffing-  his HD got put off to Tuesday  -kept  on TTS schedule this week -  HD today, then Monday to get on new OP schedule  -  Uses elisio dialyzer at [place] but has been tolerating our standard optiflux dialyzer here  [name]- [name] 2.4 grams packet TIDM and 1600 with snacks-  have decreased snack dose given phos of 3 . [name] 60mg  qdaily as well.  Will need to check phos again at some point -  did not get labs pre HD tpoday :( Anemia - on ESA and s/p iron load.  Required transfusion this hosp.  ^ ESA dose. Hgb in the 8's for now-  likely to go down with surgery PAD with critical limb ischemia s/p rt BKA for gangrene - now will require LBKA -  done 1/25  PAF - rate controlled and  Eliquis typically. On dilt and metop - cont even prior to dialysis.  Was having some RVR periodically during this admission and cardiology has been consulted - titrating BB and watching BP.  He's on midodrine now as well.  GERD COPD   9. Dispo: private transport to [place] HD unit from his [place] home too costly, will switch from [name] to [facility]- MWF-  has spot.   Now that he's having a 2nd amputation he may go to SNF and plans may need to change further depending on location of SNF.   Subjective:    Seen on HD-  resting-  since has been through so much and does not usually have dialysis complaints-  I let him sleep   BP (!) 147/96 (BP Location: Right Arm)    Pulse (!) 106     Temp (!) 97.5 F (36.4 C) (Oral)    Resp 16    Ht 5\' 9"  (1.753 m)    Wt 70.2 kg    SpO2 99%    BMI 22.85 kg/m   Intake/Output Summary (Last 24 hours) at 09/05/2021 0919 Last data filed at 09/05/2021 0208 Gross per 24 hour  Intake 910.94 ml  Output 0 ml  Net 910.94 ml   Weight change: -0.6 kg  Physical Exam: GEN: NAD, A&Ox3, NCAT HEENT: MMM LUNGS: CTA B/L no rales, rhonchi or wheezing CV: irregular irregular HR low 100s, crescendo murmur ABD: SNDNT +BS  EXT:R BKA-  edges of incision black, L new BKA-  wrapped  ACCESS: Lt BBT--> distal portion is aneurysmal +t/b  Imaging: IR US CHEST  Result Date: 09/03/2021 CLINICAL DATA:  Loculated right lateral pleural effusion by CT. EXAM: CHEST ULTRASOUND COMPARISON:  CT of the chest on 08/31/2021 FINDINGS: Ultrasound today demonstrates only a small amount of right pleural fluid which cannot be accessed for thoracentesis. IMPRESSION: Small amount of right pleural fluid by ultrasound. A large enough pocket was not identified to allow for thoracentesis today. Electronically Signed   By: Aletta Edouard M.D.   On: 09/03/2021 16:14  Labs: BMET Recent Labs  Lab 08/29/21 1629 08/31/21 0145 09/01/21 0159 09/02/21 0718 09/03/21 0305  NA 133* 131* 129* 132* 133*  K 3.7 3.9 3.9 3.5 4.0  CL 98 94* 95* 95* 96*  CO2 24 24 24 25 25   GLUCOSE 144* 96 88 89 85  BUN 38* 30* 39* 25* 33*  CREATININE 5.82* 5.01* 6.37* 4.56* 5.41*  CALCIUM 7.3* 7.6* 7.7* 8.1* 8.0*  PHOS 3.2  --   --   --   --    CBC Recent Labs  Lab 09/01/21 0159 09/02/21 0718 09/03/21 0305 09/04/21 0219  WBC 7.2 5.6 8.3 8.6  HGB 8.3* 8.3* 8.1* 8.7*  HCT 26.4* 26.5* 26.7* 27.8*  MCV 96.0 97.1 97.8 98.2  PLT 238 223 228 200    Medications:     apixaban  5 mg Oral BID   atorvastatin  10 mg Oral QHS   Chlorhexidine Gluconate Cloth  6 each Topical Q0600   cinacalcet  60 mg Oral Q supper   Darbepoetin Alfa  100 mcg Intravenous Q Tue-HD   diltiazem  120 mg Oral Daily    docusate sodium  100 mg Oral TID   gabapentin  100 mg Oral BID   hydrocortisone  25 mg Rectal BID   metoprolol tartrate  37.5 mg Oral BID   midodrine  10 mg Oral TID WC   pantoprazole  40 mg Oral Daily   polyethylene glycol  17 g Oral Daily   senna  2 tablet Oral QHS   sertraline  100 mg Oral Daily   sevelamer carbonate  2.4 g Oral TID WC   sorbitol  30 mL Oral Once    [facility] Pager [phone]

## 2021-09-05 NOTE — Procedures (Signed)
Patient was seen on dialysis and the procedure was supervised.  BFR 400  Via AVF BP is  147/96.   Patient appears to be tolerating treatment well  Louis Meckel 09/05/2021

## 2021-09-06 DIAGNOSIS — Z7189 Other specified counseling: Secondary | ICD-10-CM

## 2021-09-06 LAB — CBC
HCT: 27.3 % — ABNORMAL LOW (ref 39.0–52.0)
Hemoglobin: 8.2 g/dL — ABNORMAL LOW (ref 13.0–17.0)
MCH: 29.7 pg (ref 26.0–34.0)
MCHC: 30 g/dL (ref 30.0–36.0)
MCV: 98.9 fL (ref 80.0–100.0)
Platelets: 192 10*3/uL (ref 150–400)
RBC: 2.76 MIL/uL — ABNORMAL LOW (ref 4.22–5.81)
RDW: 18.6 % — ABNORMAL HIGH (ref 11.5–15.5)
WBC: 7.7 10*3/uL (ref 4.0–10.5)
nRBC: 0 % (ref 0.0–0.2)

## 2021-09-06 MED ORDER — SODIUM CHLORIDE 0.9 % IV SOLN
125.0000 mg | INTRAVENOUS | Status: DC
  Administered 2021-09-07: 125 mg via INTRAVENOUS
  Filled 2021-09-06 (×3): qty 10

## 2021-09-06 MED ORDER — CHLORHEXIDINE GLUCONATE CLOTH 2 % EX PADS: 6.0000 | MEDICATED_PAD | Freq: Every day | CUTANEOUS | Status: DC

## 2021-09-06 MED ORDER — DARBEPOETIN ALFA 60 MCG/0.3ML IJ SOSY
100.0000 ug | PREFILLED_SYRINGE | INTRAMUSCULAR | Status: DC
  Filled 2021-09-06: qty 0.6

## 2021-09-06 NOTE — Social Work (Signed)
CSW attempted to speak with pt in regards to his DC planning. Pt was asleep and did not awaken when name called. CSW will return to room later today.

## 2021-09-06 NOTE — Progress Notes (Signed)
[facility] Progress Note   66 y.o. male PMH COPD Barrett's, hyperparathyroidism, ESRD, failed renal transplant, PAF on Eliquis p/w ischemic foot ulcer -> BKA on 12/30.  Patient receives his dialysis treatments at [place] with [facility].   Dialysis at [facility] with [facility] EDW 68.4kg? EDW before the amputation was 72kg     Assessment/ Plan:   ESRD - TTS at [facility] w/ [facility] - establishing new EDW  - HD set up for MWF outpt now, though this may further change, see below. Due to staffing-  his HD got put off to Tuesday  -kept  on TTS schedule this week -  HD Sat, then Monday ( tomorrow)  to get on new OP schedule -  also can assess post HD bleeding-  if still an issue will need a fistulogram  -  Uses elisio dialyzer at [place] but has been tolerating our standard optiflux dialyzer here  [name]- [name] 2.4 grams packet TIDM and 1600 with snacks-  have decreased snack dose given phos of 3 . [name] 60mg  qdaily as well.  Will need to check phos again at some point -  was in the 3's  Anemia - on ESA and s/p iron load.  Required transfusion this hosp.  ^ ESA dose. Hgb in the 8's for now-  likely to go down with surgery PAD with critical limb ischemia s/p rt BKA for gangrene - now will require LBKA -  done 1/25  PAF - rate controlled and  Eliquis typically. On dilt and metop - cont even prior to dialysis.  Was having some RVR periodically during this admission and cardiology has been consulted - titrating BB and watching BP.  He's on midodrine now as well.  GERD COPD   9. Dispo: private transport to [place] HD unit from his [place] home too costly, will switch from [name] to [facility]- MWF-  has spot.   Now that he's having a 2nd amputation he may go to SNF and plans may need to change further depending on location of SNF.   Subjective:    Had post HD bleeding from access for 40 minutes-  this is the first time that has happened, no heparin  with HD-  he is concerned because people are talking about discharge " I cant take care of myself"   BP 114/62 (BP Location: Right Arm)    Pulse (!) 106    Temp 97.6 F (36.4 C) (Oral)    Resp 18    Ht 5\' 9"  (1.753 m)    Wt 69.5 kg    SpO2 99%    BMI 22.63 kg/m   Intake/Output Summary (Last 24 hours) at 09/06/2021 0937 Last data filed at 09/06/2021 0400 Gross per 24 hour  Intake 520 ml  Output 1500 ml  Net -980 ml   Weight change: -0.7 kg  Physical Exam: GEN: NAD, A&Ox3, NCAT HEENT: MMM LUNGS: CTA B/L no rales, rhonchi or wheezing CV: irregular irregular HR low 100s, crescendo murmur ABD: SNDNT +BS  EXT:R BKA-  edges of incision black, L new BKA-  wrapped  ACCESS: Lt BBT--> distal portion is aneurysmal +t/b  Imaging: No results found.  Labs: BMET Recent Labs  Lab 08/31/21 0145 09/01/21 0159 09/02/21 0718 09/03/21 0305  NA 131* 129* 132* 133*  K 3.9 3.9 3.5 4.0  CL 94* 95* 95* 96*  CO2 24 24 25 25   GLUCOSE 96 88 89 85  BUN 30* 39*  25* 33*  CREATININE 5.01* 6.37* 4.56* 5.41*  CALCIUM 7.6* 7.7* 8.1* 8.0*   CBC Recent Labs  Lab 09/02/21 0718 09/03/21 0305 09/04/21 0219 09/06/21 0405  WBC 5.6 8.3 8.6 7.7  HGB 8.3* 8.1* 8.7* 8.2*  HCT 26.5* 26.7* 27.8* 27.3*  MCV 97.1 97.8 98.2 98.9  PLT 223 228 200 192    Medications:     apixaban  5 mg Oral BID   atorvastatin  10 mg Oral QHS   Chlorhexidine Gluconate Cloth  6 each Topical Q0600   cinacalcet  60 mg Oral Q supper   Darbepoetin Alfa  100 mcg Intravenous Q Tue-HD   diltiazem  120 mg Oral Daily   docusate sodium  100 mg Oral TID   gabapentin  100 mg Oral BID   hydrocortisone  25 mg Rectal BID   metoprolol tartrate  37.5 mg Oral BID   midodrine  10 mg Oral TID WC   pantoprazole  40 mg Oral Daily   polyethylene glycol  17 g Oral Daily   senna  2 tablet Oral QHS   sertraline  100 mg Oral Daily   sevelamer carbonate  2.4 g Oral TID WC   sorbitol  30 mL Oral Once    [facility] Pager [phone]

## 2021-09-06 NOTE — Progress Notes (Signed)
PROGRESS NOTE    Scott Castro  FXT:[id number] DOB: [birth year] DOA: 08/30/2021 PCP: Franciso Bend, MD   Brief Narrative: 66 year old past medical history significant for ESRD on dialysis TTS, hypertension, hyperlipidemia, PAF, depression, chronic respiratory failure 2 L of oxygen, PAD recent right BKA on 08/07/2021 secondary to critical limb ischemia discharged to rehab.  He was seen by cardiology in inpatient rehab due to A. fib RVR and he was referred for inpatient telemetry admission.  Recent hospitalized at [facility] from 12/28 until 1//2023 right lower extremity critical limb ischemia status post BKA by vascular on 12/30.  Was just discharged to inpatient rehab here at [facility].  Found to be in RVR RVR admitted for further evaluation.  Patient  also had angiography 1/20 by Dr. [name] with BL diffuse infrapopliteal disease of the left lower extremity, vascular is recommending left BKA early this week.  Patient noted to be more hypoxic on 1/23, chest x-ray showed bilateral loculated pleural effusion, pulmonologist consulted for further evaluation. CT chest showed right loculated pleural effusion on right. Right lower lobe with irregular consolidation right lower lobe, he will need PET scan out patient.  Awaiting CIR admission.   Assessment & Plan:   Principal Problem:   Critical limb ischemia of left lower extremity/PAD  Active Problems:   COPD (chronic obstructive pulmonary disease) (HCC)   Chronic respiratory failure with hypoxia (HCC)   ESRD (end stage renal disease) (HCC)   Barrett esophagus   Anemia   Mixed hyperlipidemia   S/P BKA (below knee amputation), right (HCC)   Depressive reaction   AF (paroxysmal atrial fibrillation) (HCC)  1-Critical Limb ischemia of left lower extremity/PAD -Angiography 1/20 by Dr. Luan Pulling revealed diffuse infrapopliteal disease of the left with no revascularization options. -Recent BKA on the right 12/22 -Underwent  left BKA 1/25th -Tolerated  procedure well.  -Dressing  removed 1/27. -Stable for transfer per vascular.   2-A. Fib Continue with  metoprolol and Cardizem.  Appreciate cardiology assistance.  Continue with  eliquis.   3-Acute on chronic hypoxic respiratory failure Loculated bilateral moderate pleural effusion Moderate Pulmonary HTN;  COPD -Pulmonologist consulted. -Incentive Spirometry ordered. -Continue with nebulizer as needed -CT chest 1/23: Patchy nodularity and ground-glass in the dependent RIGHT upper lobe and bilateral lower lobes raising the question of pneumonitis with distribution raising the question of sequela of aspiration. More dense consolidative changes and irregular bronchial structures in the RIGHT lower lobe central to an area of rounded atelectasis is of uncertain significance. Given the more mass like appearance in this area and increasing size of mediastinal lymph nodes over time a possibility of neoplasm is considered. PET scan may be helpful on follow-up in addition to contrasted imaging with CT for further assessment.  Loculated and mildly nodular appearing pleural fluid in the RIGHT chest with increase and with decreased pleural fluid in the LEFT chest compared to prior imaging.  -IR consulted for thoracentesis.  There was no fluid pocket identified that was safe to access by ultrasound thoracentesis. Stable on 2 L oxygen.  Needs follow up CT chest out patient.    4-ESRD on hemodialysis -S/p renal transplant [place] with hemodialysis -Nephrology  following -Had HD  1/24.--1/26-1/28  Barrett esophagus: Continue with PPI  Anemia:  anemia of chronic disease: IV iron per nephrology  Hyperlipidemia: Continue with statins  Depression: Continue with Zoloft  Hyponatremia; fluid management with HD>    Severe tricuspid regurgitation, mild mitral regurgitation. Management per cardiology.  He agrees to go to CIR  Estimated body mass index is 22.63 kg/m as calculated from  the following:   Height as of this encounter: 5\' 9"  (1.753 m).   Weight as of this encounter: 69.5 kg.   DVT prophylaxis: eliquis.  Code Status: Full code Family Communication: care discussed with patient  Disposition Plan:  Status is: Inpatient  Remains inpatient appropriate because: management of critical limb ischemia. Resp failure  CIR when bed available        Consultants:  Nephrology Cardiology Vascular.  Pulmonologist   Procedures:    Antimicrobials:    Subjective: He is considering options for discharge.  No new complaints.   Objective: Vitals:   09/06/21 0422 09/06/21 0811 09/06/21 0930 09/06/21 1146  BP: (!) 103/52 114/62 (!) 77/64 101/65  Pulse: (!) 108 (!) 106 (!) 102 100  Resp: 18 18 16 17   Temp: 97.7 F (36.5 C) 97.6 F (36.4 C) 98.5 F (36.9 C) 97.8 F (36.6 C)  TempSrc: Oral Oral Oral Oral  SpO2: 100% 99% 100% 100%  Weight: 69.5 kg     Height:        Intake/Output Summary (Last 24 hours) at 09/06/2021 1344 Last data filed at 09/06/2021 0400 Gross per 24 hour  Intake 520 ml  Output --  Net 520 ml    Filed Weights   09/04/21 0410 09/05/21 0554 09/06/21 0422  Weight: 70 kg 70.2 kg 69.5 kg    Examination:  General exam:Chronic ill appearing Respiratory system: BL crackles.  Cardiovascular system: S 1, S 2 IRR systolic murmur Gastrointestinal system: BS present, soft nt Central nervous system: Alert,. Follows command Extremities: R BKA , left SP BKA     Data Reviewed: I have personally reviewed following labs and imaging studies  CBC: Recent Labs  Lab 09/01/21 0159 09/02/21 0718 09/03/21 0305 09/04/21 0219 09/06/21 0405  WBC 7.2 5.6 8.3 8.6 7.7  HGB 8.3* 8.3* 8.1* 8.7* 8.2*  HCT 26.4* 26.5* 26.7* 27.8* 27.3*  MCV 96.0 97.1 97.8 98.2 98.9  PLT 238 223 228 200 025    Basic Metabolic Panel: Recent Labs  Lab 08/31/21 0145 09/01/21 0159 09/02/21 0718 09/03/21 0305  NA 131* 129* 132* 133*  K 3.9 3.9 3.5 4.0  CL  94* 95* 95* 96*  CO2 24 24 25 25   GLUCOSE 96 88 89 85  BUN 30* 39* 25* 33*  CREATININE 5.01* 6.37* 4.56* 5.41*  CALCIUM 7.6* 7.7* 8.1* 8.0*    GFR: Estimated Creatinine Clearance: 13.2 mL/min (A) (by C-G formula based on SCr of 5.41 mg/dL (H)). Liver Function Tests: No results for input(s): AST, ALT, ALKPHOS, BILITOT, PROT, ALBUMIN in the last 168 hours.  No results for input(s): LIPASE, AMYLASE in the last 168 hours. No results for input(s): AMMONIA in the last 168 hours. Coagulation Profile: No results for input(s): INR, PROTIME in the last 168 hours. Cardiac Enzymes: No results for input(s): CKTOTAL, CKMB, CKMBINDEX, TROPONINI in the last 168 hours. BNP (last 3 results) No results for input(s): PROBNP in the last 8760 hours. HbA1C: No results for input(s): HGBA1C in the last 72 hours. CBG: No results for input(s): GLUCAP in the last 168 hours.  Lipid Profile: No results for input(s): CHOL, HDL, LDLCALC, TRIG, CHOLHDL, LDLDIRECT in the last 72 hours.  Thyroid Function Tests: No results for input(s): TSH, T4TOTAL, FREET4, T3FREE, THYROIDAB in the last 72 hours. Anemia Panel: No results for input(s): VITAMINB12, FOLATE, FERRITIN, TIBC, IRON, RETICCTPCT in the last 72 hours.  Sepsis Labs: No results for input(s): PROCALCITON, LATICACIDVEN in the last 168 hours.  No results found for this or any previous visit (from the past 240 hour(s)).       Radiology Studies: No results found.      Scheduled Meds:  apixaban  5 mg Oral BID   atorvastatin  10 mg Oral QHS   Chlorhexidine Gluconate Cloth  6 each Topical Q0600   cinacalcet  60 mg Oral Q supper   [START ON 09/09/2021] Darbepoetin Alfa  100 mcg Intravenous Q Wed-HD   diltiazem  120 mg Oral Daily   docusate sodium  100 mg Oral TID   gabapentin  100 mg Oral BID   hydrocortisone  25 mg Rectal BID   metoprolol tartrate  37.5 mg Oral BID   midodrine  10 mg Oral TID WC   pantoprazole  40 mg Oral Daily   polyethylene  glycol  17 g Oral Daily   senna  2 tablet Oral QHS   sertraline  100 mg Oral Daily   sevelamer carbonate  2.4 g Oral TID WC   sorbitol  30 mL Oral Once   Continuous Infusions:  sodium chloride     [START ON 09/07/2021] ferric gluconate (FERRLECIT) IVPB       LOS: 7 days    Time spent: 35 Minutes.     Elmarie Shiley, MD [facility]   If 7PM-7AM, please contact night-coverage [email or web address]  09/06/2021, 1:44 PM

## 2021-09-06 NOTE — Progress Notes (Signed)
Palliative Medicine Inpatient Follow Up Note   Per intake H&P --> Scott Castro is a 66 y.o. male with a history of ESRD on dialysis TThS, COPD on 2-3LNC, atrial fibrillation. Admitted for right lower extremity pain has had a right lower extremity BKA.  At this time patient has impaired vascular return to his left lower extremity for which he will get a left BKA.   Patient seen on 1/21 in CIR for initial - we Have been re-consulted as Scott Castro has transitioned to acute inpatient for ongoing support.  Today's Discussion (09/06/2021):  *Please note that this is a verbal dictation therefore any spelling or grammatical errors are due to the "[place]" system interpretation.  Chart reviewed inclusive of vital signs, progress notes, laboratory results, and diagnostic images.   I met with Scott Castro at bedside this after afternoon. We reviewed his right stump and his worry that he will need an AKA. He shares that if this is needed it is something that he has resided to pursue.  He goes on to share with me what his life was life about six months prior to all of this and how it was business as usual. He expresses a sense of loss regarding that normalcy. I provided support through therapeutic listening.   Reviewed the day to day frustrations in terms of hospitalization such as poor food quality, unable to get to toilet, and the monotony.   Scott Castro expressed his love of sports and how he is looking forward to the football games this evening.  Reviewed the plan for rehabilitation after acute inpatient stay. He shares that he would go back to the CIR unit which is relieving to him.   Created space and opportunity for patient to explore thoughts feelings and fears regarding current medical situation.  Questions and concerns addressed   Palliative Support Provided   Objective Assessment: Vital Signs Vitals:   09/06/21 0930 09/06/21 1146  BP: (!) 77/64 101/65  Pulse: (!) 102 100  Resp: 16 17   Temp: 98.5 F (36.9 C) 97.8 F (36.6 C)  SpO2: 100% 100%    Intake/Output Summary (Last 24 hours) at 09/06/2021 1455 Last data filed at 09/06/2021 0400 Gross per 24 hour  Intake 520 ml  Output --  Net 520 ml   Last Weight  Most recent update: 09/06/2021  4:27 AM    Weight  69.5 kg (153 lb 3.5 oz)            Gen: 26 Caucasian male in no acute distress HEENT: moist mucous membranes CV: Irregular rate and rhythm PULM: On 2 L nasal cannula breathing is even and nonlabored ABD: soft/nontender EXT:  BLE BKA(s), R stump ecchymosis Neuro: Alert and oriented x3  SUMMARY OF RECOMMENDATIONS   Full Code/ Full Scope of Care   Advanced Directives in chart  If needed for RLE he would want to pursue an AKA   Appreciate ongoing chaplain support given existential distress patient is experiencing   Appreciate transitions of care team helping patient with some of his navigational questions about what insurance will and will not cover  Goals are to be able to in the future be fitted for prosthetics and regain functionality   Ongoing Palliative support during this difficult time   Time Spent: 60  MDM - High  Medical Decision Making: 4 #/Complex Problems: 4                     Data Reviewed:  4             Management: 4 (1-Straightforward, 2-Low, 3-Moderate, 4-High) ______________________________________________________________________________________ [place] Team Team Cell Phone: [phone] Please utilize secure chat with additional questions, if there is no response within 30 minutes please call the above phone number  Palliative Medicine Team providers are available by phone from 7am to 7pm daily and can be reached through the team cell phone.  Should this patient require assistance outside of these hours, please call the patient's attending physician.

## 2021-09-06 NOTE — TOC Progression Note (Signed)
Transition of Care [facility]) - Progression Note    Patient Details  Name: NESTER BACHUS MRN: [id number] Date of Birth: 1956-01-21  Transition of Care [facility]) CM/SW [place], [place] Phone Number: [phone] 09/06/2021, 2:19 PM  Clinical Narrative:     CSW met with pt and relatives at bedside to discuss discharge options. Pt explained that CIR and SNF options was for the short term and he wanted to receive more information about LTC options.CSW explained the difference between ALF and LTC facilities and it is usually an out of pocket expense. Pt and family asked CSW about potential advantages and disadvantages between CIR and SNF. CSW assisted in guiding the discussion for them to weigh the options. Pt decided that he wanted to pursue CIR.  CSW reached out to on call weekend liaison to inform her that pt would like to pursue CIR. CSW was informed that a message would be left to follow up with pt tomorrow.  TOC team will continue to assist with discharge planning needs.      Expected Discharge Plan and Services                                                 Social Determinants of Health (SDOH) Interventions    Readmission Risk Interventions Readmission Risk Prevention Plan 07/22/2021 04/10/2019  Transportation Screening Complete Complete  PCP or Specialist Appt within 3-5 Days Complete Complete  HRI or [place] Complete Complete  Social Work Consult for [place] Planning/Counseling Complete Complete  Palliative Care Screening Not Applicable -  Medication Review Press photographer) Complete Complete  Some recent data might be hidden

## 2021-09-07 DIAGNOSIS — Z66 Do not resuscitate: Secondary | ICD-10-CM

## 2021-09-07 LAB — RENAL FUNCTION PANEL
Albumin: 1.9 g/dL — ABNORMAL LOW (ref 3.5–5.0)
Anion gap: 14 (ref 5–15)
BUN: 35 mg/dL — ABNORMAL HIGH (ref 8–23)
CO2: 25 mmol/L (ref 22–32)
Calcium: 7.8 mg/dL — ABNORMAL LOW (ref 8.9–10.3)
Chloride: 91 mmol/L — ABNORMAL LOW (ref 98–111)
Creatinine, Ser: 5.33 mg/dL — ABNORMAL HIGH (ref 0.61–1.24)
GFR, Estimated: 11 mL/min — ABNORMAL LOW (ref >60)
Glucose, Bld: 88 mg/dL (ref 70–99)
Phosphorus: 4.4 mg/dL (ref 2.5–4.6)
Potassium: 4.7 mmol/L (ref 3.5–5.1)
Sodium: 130 mmol/L — ABNORMAL LOW (ref 135–145)

## 2021-09-07 LAB — CBC
HCT: 27.6 % — ABNORMAL LOW (ref 39.0–52.0)
Hemoglobin: 8.4 g/dL — ABNORMAL LOW (ref 13.0–17.0)
MCH: 30.3 pg (ref 26.0–34.0)
MCHC: 30.4 g/dL (ref 30.0–36.0)
MCV: 99.6 fL (ref 80.0–100.0)
Platelets: 194 10*3/uL (ref 150–400)
RBC: 2.77 MIL/uL — ABNORMAL LOW (ref 4.22–5.81)
RDW: 18.7 % — ABNORMAL HIGH (ref 11.5–15.5)
WBC: 6.5 10*3/uL (ref 4.0–10.5)
nRBC: 0 % (ref 0.0–0.2)

## 2021-09-07 LAB — HEPATITIS B SURFACE ANTIGEN: Hepatitis B Surface Ag: NONREACTIVE

## 2021-09-07 LAB — SURGICAL PATHOLOGY

## 2021-09-07 NOTE — Progress Notes (Signed)
[facility] Progress Note   66 y.o. male PMH COPD Barrett's, hyperparathyroidism, ESRD, failed renal transplant, PAF on Eliquis p/w ischemic foot ulcer -> BKA on 12/30.  Patient receives his dialysis treatments at [place] with [facility].   Dialysis at [facility] with [facility] EDW 68.4kg? EDW before the amputation was 72kg     Assessment/ Plan:   ESRD - TTS at [facility] w/ [facility] - establishing new EDW  - HD set up for MWF outpt now, planning for dialysis per schedule - Monitor For post hemodialysis bleeding.  May need fistulogram -  Uses elisio dialyzer at [facility] but has been tolerating our standard optiflux dialyzer here  [name]- [name] 2.4 grams packet TIDM and 1600 with snacks-  have decreased snack dose given phos of 3 . [name] 60mg  qdaily as well.  Recheck phos with next HD  Anemia - on ESA and s/p iron load.  Required transfusion this hosp.  ^ ESA dose. Hgb in the 8's for now-  PAD with critical limb ischemia s/p rt BKA for gangrene - now will require LBKA -  done 1/25  PAF - rate controlled and  Eliquis typically. On dilt and metop - cont even prior to dialysis.  Was having some RVR periodically during this admission and cardiology has been consulted - titrating BB and watching BP.  He's on midodrine now as well.  GERD COPD   9. Dispo: private transport to [place] HD unit from his [place] home too costly, will switch from [name] to [facility]- MWF-  has spot.   Now that he's having a 2nd amputation he may go to SNF and plans may need to change further depending on location of SNF.   Subjective:    Patient was seen on dialysis with no complaints this morning.  BP 128/83    Pulse (!) 109    Temp 97.8 F (36.6 C) (Oral)    Resp 19    Ht 5\' 9"  (1.753 m)    Wt 69.6 kg    SpO2 100%    BMI 22.66 kg/m   Intake/Output Summary (Last 24 hours) at 09/07/2021 1048 Last data filed at 09/07/2021 0735 Gross per 24 hour  Intake 240 ml   Output 0 ml  Net 240 ml   Weight change:   Physical Exam: GEN: Lying in bed, no distress HEENT: MMM, no nasal discharge LUNGS: Bilateral chest rise with no increased work of breathing CV: Tachycardia, systolic murmur present ABD: SNDNT +BS  EXT:R BKA-  edges of incision black, L new BKA-  wrapped  ACCESS: Lt BBT--> distal portion is aneurysmal +t/b  Imaging: No results found.  Labs: BMET Recent Labs  Lab 09/01/21 0159 09/02/21 0718 09/03/21 0305  NA 129* 132* 133*  K 3.9 3.5 4.0  CL 95* 95* 96*  CO2 24 25 25   GLUCOSE 88 89 85  BUN 39* 25* 33*  CREATININE 6.37* 4.56* 5.41*  CALCIUM 7.7* 8.1* 8.0*   CBC Recent Labs  Lab 09/03/21 0305 09/04/21 0219 09/06/21 0405 09/07/21 0808  WBC 8.3 8.6 7.7 6.5  HGB 8.1* 8.7* 8.2* 8.4*  HCT 26.7* 27.8* 27.3* 27.6*  MCV 97.8 98.2 98.9 99.6  PLT 228 200 192 194    Medications:     apixaban  5 mg Oral BID   atorvastatin  10 mg Oral QHS   Chlorhexidine Gluconate Cloth  6 each Topical Q0600   cinacalcet  60 mg Oral Q supper   [  START ON 09/09/2021] Darbepoetin Alfa  100 mcg Intravenous Q Wed-HD   diltiazem  120 mg Oral Daily   docusate sodium  100 mg Oral TID   gabapentin  100 mg Oral BID   hydrocortisone  25 mg Rectal BID   metoprolol tartrate  37.5 mg Oral BID   midodrine  10 mg Oral TID WC   pantoprazole  40 mg Oral Daily   polyethylene glycol  17 g Oral Daily   senna  2 tablet Oral QHS   sertraline  100 mg Oral Daily   sevelamer carbonate  2.4 g Oral TID WC    Scott Castro  [facility]

## 2021-09-07 NOTE — Progress Notes (Addendum)
Palliative Medicine Inpatient Follow Up Note  Per intake H&P -->   Scott Castro is a 66 y.o. male with a history of ESRD on dialysis TThS, COPD on 2-3LNC, atrial fibrillation. Admitted for right lower extremity pain has had a right lower extremity BKA.  At this time patient has impaired vascular return to his left lower extremity for which he will get a left BKA.   Patient seen on 1/21 in CIR for initial - we Have been re-consulted as Scott Castro has transitioned to acute inpatient for ongoing support.  Today's Discussion (09/07/2021):  *Please note that this is a verbal dictation therefore any spelling or grammatical errors are due to the "[place]" system interpretation.  Chart reviewed inclusive of vital signs, progress notes, laboratory results, and diagnostic images.   I met with Scott Castro at bedside this after afternoon.  We reviewed his recent food selections. He shares that there are very limited menu options for him at this time which is frustrating to him.   Discussed again, how Scott Castro's health was six months ago and the severe changes in his independence presently. We reviewed that this is a daily struggle to contend with.   I shared with Scott Castro that since going through all of this (B BKA's) has anything from his perspective changed in terms of long term goals. I approached the topic of code status and was given the opportunity to review in great detail. DNR/DNI status understanding evidenced based poor outcomes in similar hospitalized patient, as the cause of arrest is likely associated with advanced chronic/terminal illness rather than an easily reversible acute cardio-pulmonary event. I explained that DNR/DNI does not change the medical plan and it only comes into effect after a person has arrested (died).  It is a protective measure to keep Scott Castro from harming the patient in their last moments of life. Scott Castro was agreeable to DNR/DNI with understanding that patient would not receive CPR,  defibrillation, ACLS medications, or intubation.   Reviewed the importance of self care. Scott Castro and I reviewed the need for him to get shaved and cleaned up. I was able to speak to the nursing staff in regards to this.  Ongoing goals include gaining physical strength.  Questions and concerns addressed   Palliative Support Provided   Objective Assessment: Vital Signs Vitals:   09/07/21 1135 09/07/21 1205  BP: (!) 144/68 136/83  Pulse: (!) 110 (!) 106  Resp: 16 19  Temp: 97.7 F (36.5 C) 98.1 F (36.7 C)  SpO2: 100% 100%    Intake/Output Summary (Last 24 hours) at 09/07/2021 1455 Last data filed at 09/07/2021 1135 Gross per 24 hour  Intake 240 ml  Output 2000 ml  Net -1760 ml    Last Weight  Most recent update: 09/07/2021  7:58 AM    Weight  69.6 kg (153 lb 7 oz)            Gen: 37 Caucasian male in no acute distress HEENT: moist mucous membranes CV: Irregular rate and rhythm PULM: On 2 L nasal cannula breathing is even and nonlabored ABD: soft/nontender EXT:  BLE BKA(s), R stump ecchymosis Neuro: Alert and oriented x3  SUMMARY OF RECOMMENDATIONS   DNAR/DNI   Advanced Directives in chart  If needed for RLE he would want to pursue an AKA   Appreciate ongoing chaplain support given existential distress patient is experiencing  Ongoing Palliative support during this difficult time   Time Spent: 48  MDM - High  Medical Decision  Making: 4 #/Complex Problems: 4                     Data Reviewed:    4             Management: 4 (1-Straightforward, 2-Low, 3-Moderate, 4-High) ______________________________________________________________________________________ [place] Team Team Cell Phone: [phone] Please utilize secure chat with additional questions, if there is no response within 30 minutes please call the above phone number  Palliative Medicine Team providers are available by phone from 7am to 7pm daily  and can be reached through the team cell phone.  Should this patient require assistance outside of these hours, please call the patient's attending physician.

## 2021-09-07 NOTE — Progress Notes (Signed)
This chaplain attempted F/U spiritual care visit in HD. The Pt. is sleeping. This chaplain will attempt a revisit.  Chaplain Sallyanne Kuster [phone]

## 2021-09-07 NOTE — Procedures (Signed)
I was present at this dialysis session. I have reviewed the session itself and made appropriate changes.   Filed Weights   09/05/21 0554 09/06/21 0422 09/07/21 0753  Weight: 70.2 kg 69.5 kg 69.6 kg    Recent Labs  Lab 09/03/21 0305  NA 133*  K 4.0  CL 96*  CO2 25  GLUCOSE 85  BUN 33*  CREATININE 5.41*  CALCIUM 8.0*    Recent Labs  Lab 09/04/21 0219 09/06/21 0405 09/07/21 0808  WBC 8.6 7.7 6.5  HGB 8.7* 8.2* 8.4*  HCT 27.8* 27.3* 27.6*  MCV 98.2 98.9 99.6  PLT 200 192 194    Scheduled Meds:  apixaban  5 mg Oral BID   atorvastatin  10 mg Oral QHS   Chlorhexidine Gluconate Cloth  6 each Topical Q0600   cinacalcet  60 mg Oral Q supper   [START ON 09/09/2021] Darbepoetin Alfa  100 mcg Intravenous Q Wed-HD   diltiazem  120 mg Oral Daily   docusate sodium  100 mg Oral TID   gabapentin  100 mg Oral BID   hydrocortisone  25 mg Rectal BID   metoprolol tartrate  37.5 mg Oral BID   midodrine  10 mg Oral TID WC   pantoprazole  40 mg Oral Daily   polyethylene glycol  17 g Oral Daily   senna  2 tablet Oral QHS   sertraline  100 mg Oral Daily   sevelamer carbonate  2.4 g Oral TID WC   Continuous Infusions:  sodium chloride 250 mL (09/07/21 1041)   ferric gluconate (FERRLECIT) IVPB     PRN Meds:.sodium chloride, acetaminophen, albuterol, bisacodyl, calcium carbonate, diphenhydrAMINE, guaiFENesin-dextromethorphan, milk and molasses, oxyCODONE, prochlorperazine **OR** [DISCONTINUED] prochlorperazine **OR** [DISCONTINUED] prochlorperazine, sevelamer carbonate, witch hazel-glycerin   Santiago Bumpers,  MD 09/07/2021, 10:51 AM

## 2021-09-07 NOTE — Progress Notes (Signed)
°  Progress Note    09/07/2021 7:11 AM 5 Days Post-Op  Subjective:  upset that his amp is not healing.  Says he will go ahead with higher amputation but doesn't want to.  Says maybe he will die on the table.     Vitals:   09/06/21 2305 09/07/21 0423  BP: 118/85 111/82  Pulse: (!) 107 (!) 105  Resp: 20 17  Temp:  98 F (36.7 C)  SpO2: 100% 100%    Physical Exam: Incisions:       CBC    Component Value Date/Time   WBC 7.7 09/06/2021 0405   RBC 2.76 (L) 09/06/2021 0405   HGB 8.2 (L) 09/06/2021 0405   HGB 11.4 (L) 06/11/2012 0501   HCT 27.3 (L) 09/06/2021 0405   HCT 34.6 (L) 06/11/2012 0501   PLT 192 09/06/2021 0405   PLT 251 06/11/2012 0501   MCV 98.9 09/06/2021 0405   MCV 95 06/11/2012 0501   MCH 29.7 09/06/2021 0405   MCHC 30.0 09/06/2021 0405   RDW 18.6 (H) 09/06/2021 0405   RDW 14.4 06/11/2012 0501   LYMPHSABS 0.8 08/26/2021 1004   LYMPHSABS 1.2 06/11/2012 0501   MONOABS 0.9 08/26/2021 1004   MONOABS 0.7 06/11/2012 0501   EOSABS 0.0 08/26/2021 1004   EOSABS 0.2 06/11/2012 0501   BASOSABS 0.0 08/26/2021 1004   BASOSABS 0.1 06/11/2012 0501    BMET    Component Value Date/Time   NA 133 (L) 09/03/2021 0305   NA 142 06/10/2012 0924   K 4.0 09/03/2021 0305   K 4.5 06/16/2013 1315   CL 96 (L) 09/03/2021 0305   CL 102 06/10/2012 0924   CO2 25 09/03/2021 0305   CO2 29 06/10/2012 0924   GLUCOSE 85 09/03/2021 0305   GLUCOSE 83 06/10/2012 0924   BUN 33 (H) 09/03/2021 0305   BUN 35 (H) 06/10/2012 0924   CREATININE 5.41 (H) 09/03/2021 0305   CREATININE 10.29 (H) 06/10/2012 0924   CALCIUM 8.0 (L) 09/03/2021 0305   CALCIUM 9.2 06/10/2012 0924   GFRNONAA 11 (L) 09/03/2021 0305   GFRNONAA 5 (L) 06/10/2012 0924   GFRAA 9 (L) 09/18/2019 0430   GFRAA 6 (L) 06/10/2012 0924    INR    Component Value Date/Time   INR 1.7 (H) 08/05/2021 1846     Intake/Output Summary (Last 24 hours) at 09/07/2021 0711 Last data filed at 09/06/2021 1957 Gross per 24 hour   Intake 240 ml  Output --  Net 240 ml     Assessment/Plan:  66 y.o. male is s/p right below knee amputation  5 Days Post-Op  -amputation site is not healing and will need a more proximal amputation.   -pt very upset about this and says he does not have any hope.  Appreciate palliative care following.     Leontine Locket, PA-C [facility] [phone] 09/07/2021 7:11 AM

## 2021-09-07 NOTE — Progress Notes (Signed)
Patient remains in [facility] with rate 110. On Cardizem and Eliquis.   Nothing to add from a cardiac standpoint for now. Will sign off. Please call with questions  [facility] will sign off.   Medication Recommendations:  as per [facility] Other recommendations (labs, testing, etc):  none Follow up as an outpatient:   can follow up with Dr Saralyn Pilar at [facility]

## 2021-09-07 NOTE — Care Management Important Message (Signed)
Important Message  Patient Details  Name: Scott Castro MRN: [id number] Date of Birth: [birth year]   Medicare Important Message Given:  Yes     [name] 09/07/2021, 8:09 AM

## 2021-09-07 NOTE — Progress Notes (Signed)
Physical Therapy Treatment Patient Details Name: Scott Castro MRN: [id number] DOB: 11/05/1955 Today's Date: 09/07/2021   History of Present Illness The pt is a 66 yo male presenting on 1/20 from [facility] with bilateral diffuse infrapopliteal disease of the left lower extremity. Underwent L BKA 09/02/21. Recent hospitalization at [facility] (08/05/21-08/12/21) for R BKA on 12/30 with d/c to [facility]. PMH including  ESRD on dialysis TTS, hypertension, hyperlipidemia, PAF, depression, and chronic respiratory failure 2 L of oxygen.    PT Comments    Session focused on bed level exercises for bilateral residual limb training, bed mobility and core activation. Pt motivated and enjoyed listening to 80's music during session. Can trial slide board vs anterior posterior transfer next session.    Recommendations for follow up therapy are one component of a multi-disciplinary discharge planning process, led by the attending physician.  Recommendations may be updated based on patient status, additional functional criteria and insurance authorization.  Follow Up Recommendations  Acute inpatient rehab (3hours/day)     Assistance Recommended at Discharge Frequent or constant Supervision/Assistance  Patient can return home with the following Assistance with cooking/housework;Assist for transportation;A lot of help with walking and/or transfers;A lot of help with bathing/dressing/bathroom;Help with stairs or ramp for entrance   Equipment Recommendations  Wheelchair (measurements PT);Wheelchair cushion (measurements PT);Other (comment) (slide board)    Recommendations for Other Services       Precautions / Restrictions Precautions Precautions: Fall Restrictions Weight Bearing Restrictions: Yes RLE Weight Bearing: Non weight bearing LLE Weight Bearing: Non weight bearing     Mobility  Bed Mobility Overal bed mobility: Needs Assistance Bed Mobility: Supine to Sit, Rolling Rolling: Min assist, Mod assist   Supine  to sit: Supervision     General bed mobility comments: Pt able to progress into long sitting position using bilateral bed rails without physical assist    Transfers                        Ambulation/Gait                   Stairs             Wheelchair Mobility    Modified Rankin (Stroke Patients Only)       Balance                                            Cognition Arousal/Alertness: Awake/alert Behavior During Therapy: WFL for tasks assessed/performed Overall Cognitive Status: Within Functional Limits for tasks assessed                                          Exercises Amputee Exercises Hip Extension: Left, 10 reps, Sidelying Hip ABduction/ADduction: Both, Other reps (comment), Supine, Sidelying (x10 supine, x 20 sidelying) Knee Flexion: Both, 10 reps, Supine Straight Leg Raises: Both, 15 reps, Supine Other Exercises Other Exercises: x20 pull to long sit using bed rails    General Comments        Pertinent Vitals/Pain Pain Assessment Pain Assessment: Faces Faces Pain Scale: Hurts little more Pain Location: L residual limb Pain Descriptors / Indicators: Spasm Pain Intervention(s): Monitored during session    Home Living  Prior Function            PT Goals (current goals can now be found in the care plan section) Acute Rehab PT Goals Patient Stated Goal: go to acute rehab Potential to Achieve Goals: Good Progress towards PT goals: Progressing toward goals    Frequency    Min 3X/week      PT Plan Current plan remains appropriate    Co-evaluation              AM-PAC PT "6 Clicks" Mobility   Outcome Measure  Help needed turning from your back to your side while in a flat bed without using bedrails?: A Little Help needed moving from lying on your back to sitting on the side of a flat bed without using bedrails?: A Lot Help needed moving to  and from a bed to a chair (including a wheelchair)?: Total Help needed standing up from a chair using your arms (e.g., wheelchair or bedside chair)?: Total Help needed to walk in hospital room?: Total Help needed climbing 3-5 steps with a railing? : Total 6 Click Score: 9    End of Session Equipment Utilized During Treatment: Oxygen Activity Tolerance: Patient tolerated treatment well Patient left: in bed;with call bell/phone within reach;with bed alarm set Nurse Communication: Mobility status PT Visit Diagnosis: Muscle weakness (generalized) (M62.81);Other abnormalities of gait and mobility (R26.89);Pain Pain - Right/Left: Left Pain - part of body: Leg     Time: 4656-8127 PT Time Calculation (min) (ACUTE ONLY): 27 min  Charges:  $Therapeutic Exercise: 8-22 mins $Therapeutic Activity: 8-22 mins                     Wyona Almas, PT, DPT Acute Rehabilitation Services Pager [phone] Office [phone]    Deno Etienne 09/07/2021, 4:57 PM

## 2021-09-07 NOTE — Progress Notes (Signed)
PT Cancellation Note  Patient Details Name: Scott Castro MRN: [id number] DOB: 04-03-1956   Cancelled Treatment:    Reason Eval/Treat Not Completed: Patient at procedure or test/unavailable (HD)  Wyona Almas, PT, DPT Acute Rehabilitation Services Pager [phone] Office [phone]    Deno Etienne 09/07/2021, 9:01 AM

## 2021-09-07 NOTE — Progress Notes (Signed)
PROGRESS NOTE    AZARIAN STARACE  VPX:[id number] DOB: [birth year] DOA: 08/30/2021 PCP: Franciso Bend, MD   Brief Narrative: 66 year old past medical history significant for ESRD on dialysis TTS, hypertension, hyperlipidemia, PAF, depression, chronic respiratory failure 2 L of oxygen, PAD recent right BKA on 08/07/2021 secondary to critical limb ischemia discharged to rehab.  He was seen by cardiology in inpatient rehab due to A. fib RVR and he was referred for inpatient telemetry admission.  Recent hospitalized at [facility] from 12/28 until 1//2023 right lower extremity critical limb ischemia status post BKA by vascular on 12/30.  Was just discharged to inpatient rehab here at [facility].  Found to be in RVR RVR admitted for further evaluation.  Patient  also had angiography 1/20 by Dr. [name] with BL diffuse infrapopliteal disease of the left lower extremity, vascular is recommending left BKA early this week.  Patient noted to be more hypoxic on 1/23, chest x-ray showed bilateral loculated pleural effusion, pulmonologist consulted for further evaluation. CT chest showed right loculated pleural effusion on right. Right lower lobe with irregular consolidation right lower lobe, he will need PET scan out patient.  Awaiting CIR admission.   Assessment & Plan:   Principal Problem:   Critical limb ischemia of left lower extremity/PAD  Active Problems:   COPD (chronic obstructive pulmonary disease) (HCC)   Chronic respiratory failure with hypoxia (HCC)   ESRD (end stage renal disease) (HCC)   Barrett esophagus   Anemia   Mixed hyperlipidemia   S/P BKA (below knee amputation), right (HCC)   Depressive reaction   AF (paroxysmal atrial fibrillation) (HCC)  1-Critical Limb ischemia of left lower extremity/PAD -Angiography 1/20 by Dr. Luan Pulling revealed diffuse infrapopliteal disease of the left with no revascularization options. -Recent BKA on the right 12/22 -Underwent  left BKA 1/25th -Tolerated  procedure well.  -Dressing  removed 1/27. Patient might required further management of Right BKA, awaiting Vascular recommendations.   2-A. Fib Continue with  metoprolol and Cardizem.  Appreciate cardiology assistance.  Continue with  eliquis.   3-Acute on chronic hypoxic respiratory failure Loculated bilateral moderate pleural effusion Moderate Pulmonary HTN;  COPD -Pulmonologist consulted. -Incentive Spirometry ordered. -Continue with nebulizer as needed -CT chest 1/23: Patchy nodularity and ground-glass in the dependent RIGHT upper lobe and bilateral lower lobes raising the question of pneumonitis with distribution raising the question of sequela of aspiration. More dense consolidative changes and irregular bronchial structures in the RIGHT lower lobe central to an area of rounded atelectasis is of uncertain significance. Given the more mass like appearance in this area and increasing size of mediastinal lymph nodes over time a possibility of neoplasm is considered. PET scan may be helpful on follow-up in addition to contrasted imaging with CT for further assessment.  Loculated and mildly nodular appearing pleural fluid in the RIGHT chest with increase and with decreased pleural fluid in the LEFT chest compared to prior imaging.  -IR consulted for thoracentesis.  There was no fluid pocket identified that was safe to access by ultrasound thoracentesis. Stable on 2 L oxygen.  Needs follow up CT chest out patient.    4-ESRD on hemodialysis -S/p renal transplant [place] with hemodialysis -Nephrology  following. -had HD today.   Barrett esophagus: Continue with PPI  Anemia:  anemia of chronic disease: IV iron per nephrology  Hyperlipidemia: Continue with statins  Depression: Continue with Zoloft  Hyponatremia; fluid management with HD>    Severe tricuspid regurgitation, mild mitral regurgitation.  Management per cardiology.     He agrees to go to CIR  Estimated body  mass index is 22.66 kg/m as calculated from the following:   Height as of this encounter: 5\' 9"  (1.753 m).   Weight as of this encounter: 69.6 kg.   DVT prophylaxis: eliquis.  Code Status: Full code Family Communication: care discussed with patient  Disposition Plan:  Status is: Inpatient  Remains inpatient appropriate because: management of critical limb ischemia.   Awaiting Vascular recommendation for Right BKA management.        Consultants:  Nephrology Cardiology Vascular.  Pulmonologist   Procedures:    Antimicrobials:    Subjective: Seen in HD, no new complaints. He has decide to go to CIR  Objective: Vitals:   09/07/21 1030 09/07/21 1100 09/07/21 1130 09/07/21 1205  BP: 128/83 140/81 (!) 149/95 136/83  Pulse: (!) 109 (!) 108 (!) 109 (!) 106  Resp: 19 (!) 29 (!) 24 19  Temp:   98.4 F (36.9 C) 98.1 F (36.7 C)  TempSrc:   Oral Oral  SpO2: 100% 100% 100% 100%  Weight:      Height:        Intake/Output Summary (Last 24 hours) at 09/07/2021 1320 Last data filed at 09/07/2021 0735 Gross per 24 hour  Intake 240 ml  Output 0 ml  Net 240 ml    Filed Weights   09/05/21 0554 09/06/21 0422 09/07/21 0753  Weight: 70.2 kg 69.5 kg 69.6 kg    Examination:  General exam: Chronic ill appearing Respiratory system: BL crackles.  Cardiovascular system: S 1, S 2 RRR  systolic murmur Gastrointestinal system: BS present, soft, nt Central nervous system: Alert, follows command Extremities: R BKA , left SP BKA     Data Reviewed: I have personally reviewed following labs and imaging studies  CBC: Recent Labs  Lab 09/02/21 0718 09/03/21 0305 09/04/21 0219 09/06/21 0405 09/07/21 0808  WBC 5.6 8.3 8.6 7.7 6.5  HGB 8.3* 8.1* 8.7* 8.2* 8.4*  HCT 26.5* 26.7* 27.8* 27.3* 27.6*  MCV 97.1 97.8 98.2 98.9 99.6  PLT 223 228 200 192 381    Basic Metabolic Panel: Recent Labs  Lab 09/01/21 0159 09/02/21 0718 09/03/21 0305  NA 129* 132* 133*  K 3.9 3.5  4.0  CL 95* 95* 96*  CO2 24 25 25   GLUCOSE 88 89 85  BUN 39* 25* 33*  CREATININE 6.37* 4.56* 5.41*  CALCIUM 7.7* 8.1* 8.0*    GFR: Estimated Creatinine Clearance: 13.2 mL/min (A) (by C-G formula based on SCr of 5.41 mg/dL (H)). Liver Function Tests: No results for input(s): AST, ALT, ALKPHOS, BILITOT, PROT, ALBUMIN in the last 168 hours.  No results for input(s): LIPASE, AMYLASE in the last 168 hours. No results for input(s): AMMONIA in the last 168 hours. Coagulation Profile: No results for input(s): INR, PROTIME in the last 168 hours. Cardiac Enzymes: No results for input(s): CKTOTAL, CKMB, CKMBINDEX, TROPONINI in the last 168 hours. BNP (last 3 results) No results for input(s): PROBNP in the last 8760 hours. HbA1C: No results for input(s): HGBA1C in the last 72 hours. CBG: No results for input(s): GLUCAP in the last 168 hours.  Lipid Profile: No results for input(s): CHOL, HDL, LDLCALC, TRIG, CHOLHDL, LDLDIRECT in the last 72 hours.  Thyroid Function Tests: No results for input(s): TSH, T4TOTAL, FREET4, T3FREE, THYROIDAB in the last 72 hours. Anemia Panel: No results for input(s): VITAMINB12, FOLATE, FERRITIN, TIBC, IRON, RETICCTPCT in the last 72 hours. Sepsis  Labs: No results for input(s): PROCALCITON, LATICACIDVEN in the last 168 hours.  No results found for this or any previous visit (from the past 240 hour(s)).       Radiology Studies: No results found.      Scheduled Meds:  apixaban  5 mg Oral BID   atorvastatin  10 mg Oral QHS   Chlorhexidine Gluconate Cloth  6 each Topical Q0600   cinacalcet  60 mg Oral Q supper   [START ON 09/09/2021] Darbepoetin Alfa  100 mcg Intravenous Q Wed-HD   diltiazem  120 mg Oral Daily   docusate sodium  100 mg Oral TID   gabapentin  100 mg Oral BID   hydrocortisone  25 mg Rectal BID   metoprolol tartrate  37.5 mg Oral BID   midodrine  10 mg Oral TID WC   pantoprazole  40 mg Oral Daily   polyethylene glycol  17 g Oral  Daily   senna  2 tablet Oral QHS   sertraline  100 mg Oral Daily   sevelamer carbonate  2.4 g Oral TID WC   Continuous Infusions:  sodium chloride 250 mL (09/07/21 1041)   ferric gluconate (FERRLECIT) IVPB       LOS: 8 days    Time spent: 35 Minutes.     Elmarie Shiley, MD [facility]   If 7PM-7AM, please contact night-coverage [email or web address]  09/07/2021, 1:20 PM

## 2021-09-07 NOTE — Progress Notes (Signed)
Inpatient Rehab Admissions Coordinator:   I am following for potential CIR re-admit; however, it is unclear when [facility] plans to do Pt.'s R AKA, so I will not admit this Pt. Until that is clarified. Vascular PA this morning stated it was likely to be this week.  Clemens Catholic, [place], [place] Admissions Coordinator  [phone] ([place]) [phone] (office)

## 2021-09-08 ENCOUNTER — Telehealth: Payer: Self-pay

## 2021-09-08 LAB — CBC
HCT: 28.3 % — ABNORMAL LOW (ref 39.0–52.0)
Hemoglobin: 8.7 g/dL — ABNORMAL LOW (ref 13.0–17.0)
MCH: 30.6 pg (ref 26.0–34.0)
MCHC: 30.7 g/dL (ref 30.0–36.0)
MCV: 99.6 fL (ref 80.0–100.0)
Platelets: 187 10*3/uL (ref 150–400)
RBC: 2.84 MIL/uL — ABNORMAL LOW (ref 4.22–5.81)
RDW: 18.9 % — ABNORMAL HIGH (ref 11.5–15.5)
WBC: 5 10*3/uL (ref 4.0–10.5)
nRBC: 0 % (ref 0.0–0.2)

## 2021-09-08 MED ORDER — ONDANSETRON HCL 4 MG/2ML IJ SOLN
4.0000 mg | Freq: Four times a day (QID) | INTRAMUSCULAR | Status: DC | PRN
  Administered 2021-09-09 (×2): 4 mg via INTRAVENOUS
  Filled 2021-09-08 (×2): qty 2

## 2021-09-08 NOTE — Progress Notes (Signed)
On-call physician notified x 2,for patient's complaint of nausea, requesting IV antiemetic. Will continue to monitor.

## 2021-09-08 NOTE — Progress Notes (Addendum)
Inpatient Rehab Admissions Coordinator:   Note plans for R BK>AK revision; timing tbd.  Will follow.  Addendum 1105: Plan for revision tomorrow.  Will f/u with patient after post-op therapy evaluations have been completed to discuss return to CIR.    Shann Medal, PT, DPT Admissions Coordinator [phone] 09/08/21  8:36 AM

## 2021-09-08 NOTE — Evaluation (Signed)
Occupational Therapy Re-evaluation Patient Details Name: Scott Castro MRN: [id number] DOB: [birth year] Today's Date: 09/08/2021   History of Present Illness 66 yo male presenting on 1/20 from [facility] with bilateral diffuse infrapopliteal disease of the left lower extremity. S/p L BKA 09/02/21. Recent hospitalization at [facility] (08/05/21-08/12/21) for R BKA on 12/30 with d/c to [facility]. Planning for revision of R BKA on 2/1. PMH including  ESRD on dialysis TTS, hypertension, hyperlipidemia, PAF, depression, and chronic respiratory failure 2 L of oxygen.   Clinical Impression   Pt s/p L BKA on 09/02/21. Providing education on compensatory techniques for LB dressing. Pt currently requiring Mod A for donning underwear at bed level. Pt performing lateral scoot with sliding board to drop arm recliner with Min A for safety. Continue to recommend dc to [facility] for intensive OT and will continue to follow acutely as admitted.      Recommendations for follow up therapy are one component of a multi-disciplinary discharge planning process, led by the attending physician.  Recommendations may be updated based on patient status, additional functional criteria and insurance authorization.   Follow Up Recommendations  Acute inpatient rehab (3hours/day) (Pending LLE amputation)    Assistance Recommended at Discharge Frequent or constant Supervision/Assistance  Patient can return home with the following A little help with walking and/or transfers;A little help with bathing/dressing/bathroom;Assist for transportation;Help with stairs or ramp for entrance;Assistance with cooking/housework    Functional Status Assessment  Patient has had a recent decline in their functional status and demonstrates the ability to make significant improvements in function in a reasonable and predictable amount of time.  Equipment Recommendations  BSC/3in1;Wheelchair (measurements OT);Wheelchair cushion (measurements OT);Other (comment);Tub/shower  bench (Sliding board)    Recommendations for Other Services       Precautions / Restrictions Precautions Precautions: Fall Restrictions Other Position/Activity Restrictions: s/p bilateral BKA      Mobility Bed Mobility Overal bed mobility: Needs Assistance Bed Mobility: Supine to Sit, Rolling Rolling: Supervision   Supine to sit: Min assist, HOB elevated     General bed mobility comments: Holding therapists hand to pull into upright posture    Transfers Overall transfer level: Needs assistance Equipment used: Sliding board Transfers: Bed to chair/wheelchair/BSC            Lateral/Scoot Transfers: With slide board, Min assist General transfer comment: MIn A for lateral scoot to recliner (drop arm) with sliding board      Balance Overall balance assessment: Needs assistance Sitting-balance support: Feet unsupported, Single extremity supported, Bilateral upper extremity supported, No upper extremity supported Sitting balance-Leahy Scale: Fair                                     ADL either performed or assessed with clinical judgement   ADL Overall ADL's : Needs assistance/impaired                     Lower Body Dressing: Moderate assistance;Bed level Lower Body Dressing Details (indicate cue type and reason): Cues for threading LLE first due to decreased ROM. Pt then rolling to pull up underwear over hips. Pt however, with weakness and difficulty maintaining sidelying position while also reaching back for underwear management. Toilet Transfer: Minimal assistance;Transfer board (simluated to recliner)           Functional mobility during ADLs: Minimal assistance (lateral scoot) General ADL Comments: Pt performing LB dressing at bed level  and then lateral scoot to recliner with sliding board     Vision Baseline Vision/History: 1 Wears glasses Ability to See in Adequate Light: 0 Adequate Patient Visual Report: No change from  baseline Vision Assessment?: No apparent visual deficits     Perception     Praxis      Pertinent Vitals/Pain Pain Assessment Pain Assessment: Faces Faces Pain Scale: Hurts little more Breathing: normal Negative Vocalization: none Facial Expression: smiling or inexpressive Body Language: relaxed Consolability: no need to console PAINAD Score: 0 Pain Location: L residual limb Pain Descriptors / Indicators: Spasm Pain Intervention(s): Monitored during session, Limited activity within patient's tolerance, Repositioned     Hand Dominance     Extremity/Trunk Assessment Upper Extremity Assessment Upper Extremity Assessment: Generalized weakness   Lower Extremity Assessment Lower Extremity Assessment: RLE deficits/detail;LLE deficits/detail RLE Deficits / Details: S/p R BKA RLE Coordination: decreased gross motor LLE Deficits / Details: S/p L BKA LLE Coordination: decreased gross motor   Cervical / Trunk Assessment Cervical / Trunk Assessment: Normal   Communication Communication Communication: No difficulties   Cognition Arousal/Alertness: Awake/alert Behavior During Therapy: WFL for tasks assessed/performed Overall Cognitive Status: Within Functional Limits for tasks assessed                                 General Comments: Noting low affect. However, willing to participate in therapy     General Comments  VSS on 2L    Exercises Amputee Exercises Hip ABduction/ADduction:  (x10 supine, x 20 sidelying)   Shoulder Instructions      Home Living Family/patient expects to be discharged to:: Private residence Living Arrangements: Alone Available Help at Discharge: Friend(s);Neighbor;Available PRN/intermittently Type of Home: House Home Access: Ramped entrance     Home Layout: One level     Bathroom Shower/Tub: Teacher, early years/pre: Standard     Home Equipment: None      Lives With: Alone    Prior Functioning/Environment  Prior Level of Function : Needs assist             Mobility Comments: Was practicing transfers with RW and therapy staff. ADLs Comments: Was working on ADLs with therapists and staff. Reaching Min A level        OT Problem List: Decreased strength;Decreased coordination;Cardiopulmonary status limiting activity;Pain;Decreased range of motion;Decreased activity tolerance;Decreased safety awareness;Impaired balance (sitting and/or standing);Decreased knowledge of use of DME or AE      OT Treatment/Interventions: Self-care/ADL training;Therapeutic exercise;Therapeutic activities;DME and/or AE instruction;Balance training;Patient/family education    OT Goals(Current goals can be found in the care plan section) Acute Rehab OT Goals OT Goal Formulation: With patient Time For Goal Achievement: 09/22/21 Potential to Achieve Goals: Good ADL Goals Pt Will Perform Lower Body Dressing: with modified independence;sitting/lateral leans Pt Will Transfer to Toilet: with modified independence;bedside commode;with transfer board Pt Will Perform Toileting - Clothing Manipulation and hygiene: with modified independence;sitting/lateral leans Pt Will Perform Tub/Shower Transfer: Tub transfer;tub bench;with supervision  OT Frequency: Min 3X/week    Co-evaluation              AM-PAC OT "6 Clicks" Daily Activity     Outcome Measure Help from another person eating meals?: A Little Help from another person taking care of personal grooming?: A Little Help from another person toileting, which includes using toliet, bedpan, or urinal?: A Little Help from another person bathing (including washing, rinsing, drying)?: A Little Help  from another person to put on and taking off regular upper body clothing?: A Little Help from another person to put on and taking off regular lower body clothing?: A Little 6 Click Score: 18   End of Session Equipment Utilized During Treatment: Other (comment) (sliding  board) Nurse Communication: Mobility status  Activity Tolerance: Patient tolerated treatment well Patient left: in chair;with call bell/phone within reach  OT Visit Diagnosis: Other abnormalities of gait and mobility (R26.89);Muscle weakness (generalized) (M62.81);Pain Pain - Right/Left: Right Pain - part of body: Knee;Leg;Hip                Time: 0475-3391 OT Time Calculation (min): 27 min Charges:  OT General Charges $OT Visit: 1 Visit OT Evaluation $OT Re-eval: 1 Re-eval OT Treatments $Self Care/Home Management : 8-22 mins  Eleanore Junio MSOT, OTR/L Acute Rehab Pager: [phone] Office: [place] 09/08/2021, 3:49 PM

## 2021-09-08 NOTE — Progress Notes (Signed)
Palliative Medicine Inpatient Follow Up Note  Per intake H&P -->   Scott Castro is a 66 y.o. male with a history of ESRD on dialysis TThS, COPD on 2-3LNC, atrial fibrillation. Admitted for right lower extremity pain has had a right lower extremity BKA.  At this time patient has impaired vascular return to his left lower extremity for which he will get a left BKA.   Patient seen on 1/21 in CIR for initial - we Have been re-consulted as [place] has transitioned to acute inpatient for ongoing support.  Today's Discussion (09/08/2021):  *Please note that this is a verbal dictation therefore any spelling or grammatical errors are due to the "[place] [employer]" system interpretation.  Chart reviewed inclusive of vital signs, progress notes, laboratory results, and diagnostic images.   I met with Scott Castro at bedside this morning. He is in his bed in NAD.   At bedside is the vascular medicine PA who is sharing with Scott Castro how poorly his R BKA is healing. He shares that he knows it will need to be an AKA. Reviewed the worry of that not healing and "what if this happens to my left leg". Discussed the difficulty coping with the difference in his life as compared to six months ago.  Dr. [name] cam in shortly after and reviewed that same information. Scott Castro is not liking the idea of another amputation but shares he has "no other choice." He very much wants to live.   Discussed how this will change his future trajectory. He remains in good spirits this morning and wants to look at the positives. He is aware of the "what if's" but is optimistic despite these.   I shared that per primary MD we can liberalize his diet a bit which he is thankful of.   Questions and concerns addressed   Palliative Support Provided  __________________________________ Addendum:  I was able to call patients cousin, Scott Castro this morning and briefly introduce myself and our reason for involvement in Scott Castro's care. She was thankful  for this and had no follow up questions.   Objective Assessment: Vital Signs Vitals:   09/08/21 0425 09/08/21 0909  BP: 120/90 102/71  Castro: (!) 108 (!) 109  Resp: 18 20  Temp: 98.1 F (36.7 C) 98.4 F (36.9 C)  SpO2: 100% 100%    Intake/Output Summary (Last 24 hours) at 09/08/2021 1031 Last data filed at 09/08/2021 0900 Gross per 24 hour  Intake 293.05 ml  Output 2000 ml  Net -1706.95 ml    Last Weight  Most recent update: 09/07/2021  3:16 PM    Weight  67.6 kg (149 lb 0.5 oz)            Gen: 58 Caucasian male in no acute distress HEENT: moist mucous membranes CV: Irregular rate and rhythm PULM: On 2 L nasal cannula breathing is even and nonlabored ABD: soft/nontender EXT:  BLE BKA(s), R stump ecchymosis Neuro: Alert and oriented x3  SUMMARY OF RECOMMENDATIONS   DNAR/DNI  Will complete a MOST prior to discharge   Advanced Directives in chart  Plan for RLE  AKA   Appreciate ongoing chaplain support given existential distress patient is experiencing  Ongoing Palliative support during this difficult time   MDM - High  Medical Decision Making: 4 #/Complex Problems: 4                     Data Reviewed:    4  Management: 4 (1-Straightforward, 2-Low, 3-Moderate, 4-High) ______________________________________________________________________________________ [place] Team Team Cell Phone: [phone] Please utilize secure chat with additional questions, if there is no response within 30 minutes please call the above phone number  Palliative Medicine Team providers are available by phone from 7am to 7pm daily and can be reached through the team cell phone.  Should this patient require assistance outside of these hours, please call the patient's attending physician

## 2021-09-08 NOTE — Progress Notes (Addendum)
Progress Note    09/08/2021 8:20 AM 6 Days Post-Op  Subjective:  overwhelmed by his recent amputations. No acute complaints   Vitals:   09/08/21 0054 09/08/21 0425  BP: 109/78 120/90  Pulse: (!) 108 (!) 108  Resp: 16 18  Temp: 97.7 F (36.5 C) 98.1 F (36.7 C)  SpO2: 100% 100%   Physical Exam: Cardiac:  regular Lungs:  non labored, 2L [place] Incisions:  right BKA skin necrotic, serosanguinous drainage. Left BKA healing well. There is superficial skin abrasions on left knee. No signs of infection   Neurologic: alert and oriented  CBC    Component Value Date/Time   WBC 5.0 09/08/2021 0230   RBC 2.84 (L) 09/08/2021 0230   HGB 8.7 (L) 09/08/2021 0230   HGB 11.4 (L) 06/11/2012 0501   HCT 28.3 (L) 09/08/2021 0230   HCT 34.6 (L) 06/11/2012 0501   PLT 187 09/08/2021 0230   PLT 251 06/11/2012 0501   MCV 99.6 09/08/2021 0230   MCV 95 06/11/2012 0501   MCH 30.6 09/08/2021 0230   MCHC 30.7 09/08/2021 0230   RDW 18.9 (H) 09/08/2021 0230   RDW 14.4 06/11/2012 0501   LYMPHSABS 0.8 08/26/2021 1004   LYMPHSABS 1.2 06/11/2012 0501   MONOABS 0.9 08/26/2021 1004   MONOABS 0.7 06/11/2012 0501   EOSABS 0.0 08/26/2021 1004   EOSABS 0.2 06/11/2012 0501   BASOSABS 0.0 08/26/2021 1004   BASOSABS 0.1 06/11/2012 0501    BMET    Component Value Date/Time   NA 130 (L) 09/07/2021 1430   NA 142 06/10/2012 0924   K 4.7 09/07/2021 1430   K 4.5 06/16/2013 1315   CL 91 (L) 09/07/2021 1430   CL 102 06/10/2012 0924   CO2 25 09/07/2021 1430   CO2 29 06/10/2012 0924   GLUCOSE 88 09/07/2021 1430   GLUCOSE 83 06/10/2012 0924   BUN 35 (H) 09/07/2021 1430   BUN 35 (H) 06/10/2012 0924   CREATININE 5.33 (H) 09/07/2021 1430   CREATININE 10.29 (H) 06/10/2012 0924   CALCIUM 7.8 (L) 09/07/2021 1430   CALCIUM 9.2 06/10/2012 0924   GFRNONAA 11 (L) 09/07/2021 1430   GFRNONAA 5 (L) 06/10/2012 0924   GFRAA 9 (L) 09/18/2019 0430   GFRAA 6 (L) 06/10/2012 0924    INR    Component Value  Date/Time   INR 1.7 (H) 08/05/2021 1846     Intake/Output Summary (Last 24 hours) at 09/08/2021 0820 Last data filed at 09/07/2021 1500 Gross per 24 hour  Intake 175.05 ml  Output 2000 ml  Net -1824.95 ml     Assessment/Plan:  66 y.o. male is s/p L BKA  6 Days Post-Op . Also recently had Right BKA. L BKA well appearing. Will continue to closely monitor for adequate healing. Right BKA with necrotic anterior flap and skin edges. Patient understands that this will not heal. He does not have enough room for revision below knee. He is agreeable to right AKA. Will discuss timing of this with Dr.Brabham/ Dr. [name]. Patient hopeful to return to [place], PA-C [facility] [phone] 09/08/2021 8:20 AM  VASCULAR STAFF ADDENDUM: I have independently interviewed and examined the patient. I agree with the above.  Necrosis of right BKA performed about a month ago at [facility]. Plan conversion to above knee amputation tomorrow. NPO after midnight. Orders for consent written.  [name], MD [facility] Phone Number: [phone] 09/08/2021 8:53 AM

## 2021-09-08 NOTE — Telephone Encounter (Signed)
Spoke with patient's wife Juliann Pulse. She states patient is currently hospitalized and will be discharged to a rehab facility. Will cancel Palliative Care referral and send hospital liaison a e-mail to follow.

## 2021-09-08 NOTE — Telephone Encounter (Signed)
Spoke with patient's relative Juliann Pulse. She states patient is currently hospitalized and will be discharged to a rehab facility. Will cancel Palliative Care referral and send hospital liaison a e-mail to follow.

## 2021-09-08 NOTE — Progress Notes (Addendum)
Scott Castro Progress Note   66 y.o. male PMH COPD Barrett's, hyperparathyroidism, ESRD, failed renal transplant, PAF on Eliquis p/w ischemic foot ulcer -> BKA on 12/30.  Patient receives his dialysis treatments at [place] with [facility].   Dialysis at [facility] with [facility] EDW 68.4kg? EDW before the amputation was 72kg     Assessment/ Plan:   ESRD - TTS at [facility] w/ [facility] - establishing new EDW  - HD set up for MWF outpt now, planning for dialysis per schedule -No further postdialysis bleeding -  Uses elisio dialyzer at [facility] but has been tolerating our standard optiflux dialyzer here  [name]- phos at goal on renvela. Ca corrects to normal.  Anemia - on ESA and s/p iron load.  Required transfusion this hosp.  ^ ESA dose. Hgb in the 8's for now-  PAD with critical limb ischemia s/p rt BKA for gangrene - now will require LBKA -  done 1/25. Now due to poor healing will require right AKA.  PAF - rate controlled and  Eliquis typically. On dilt and metop - cont even prior to dialysis.  Was having some RVR periodically during this admission and cardiology has been consulted - titrating BB and watching BP.  He's on midodrine now as well.  GERD COPD   9. Dispo: private transport to [facility] unit from his [place] home too costly, will switch from [name] to [facility]- MWF-  has spot.   Possibly going back to CIR or SNF  ADDENDUM: given plans for AKA tomorrow will plan to hold dialysis and instead do HD on Thursday.  Subjective:    Patient feeling okay today.  Denies any specific complaints.  Tolerated dialysis yesterday with no bleeding  BP 102/71 (BP Location: Right Arm)    Pulse (!) 109    Temp 98.4 F (36.9 C) (Oral)    Resp 20    Ht 5\' 9"  (1.753 m)    Wt 67.6 kg    SpO2 100%    BMI 22.01 kg/m   Intake/Output Summary (Last 24 hours) at 09/08/2021 8315 Last data filed at 09/07/2021 1500 Gross per 24 hour  Intake 175.05 ml  Output  2000 ml  Net -1824.95 ml   Weight change:   Physical Exam: GEN: Lying in bed, no distress HEENT: MMM, no nasal discharge LUNGS: Bilateral chest rise with no increased work of breathing CV: Tachycardia, systolic murmur present ABD: SNDNT +BS  EXT:R BKA-  edges of incision black, L new BKA-  wrapped  ACCESS: Positive bruit  Imaging: No results found.  Labs: BMET Recent Labs  Lab 09/02/21 0718 09/03/21 0305 09/07/21 1430  NA 132* 133* 130*  K 3.5 4.0 4.7  CL 95* 96* 91*  CO2 25 25 25   GLUCOSE 89 85 88  BUN 25* 33* 35*  CREATININE 4.56* 5.41* 5.33*  CALCIUM 8.1* 8.0* 7.8*  PHOS  --   --  4.4   CBC Recent Labs  Lab 09/04/21 0219 09/06/21 0405 09/07/21 0808 09/08/21 0230  WBC 8.6 7.7 6.5 5.0  HGB 8.7* 8.2* 8.4* 8.7*  HCT 27.8* 27.3* 27.6* 28.3*  MCV 98.2 98.9 99.6 99.6  PLT 200 192 194 187    Medications:     atorvastatin  10 mg Oral QHS   Chlorhexidine Gluconate Cloth  6 each Topical Q0600   cinacalcet  60 mg Oral Q supper   [START ON 09/09/2021] Darbepoetin Alfa  100 mcg Intravenous Q Wed-HD  diltiazem  120 mg Oral Daily   docusate sodium  100 mg Oral TID   gabapentin  100 mg Oral BID   hydrocortisone  25 mg Rectal BID   metoprolol tartrate  37.5 mg Oral BID   midodrine  10 mg Oral TID WC   pantoprazole  40 mg Oral Daily   polyethylene glycol  17 g Oral Daily   senna  2 tablet Oral QHS   sertraline  100 mg Oral Daily   sevelamer carbonate  2.4 g Oral TID WC    [name]  [facility]

## 2021-09-08 NOTE — Progress Notes (Signed)
PROGRESS NOTE    Scott Castro  [id number]:[id number] DOB: [birth year] DOA: 08/30/2021 PCP: Franciso Bend, MD   Brief Narrative: 66 year old past medical history significant for ESRD on dialysis TTS, hypertension, hyperlipidemia, PAF, depression, chronic respiratory failure 2 L of oxygen, PAD recent right BKA on 08/07/2021 secondary to critical limb ischemia discharged to rehab.  He was seen by cardiology in inpatient rehab due to A. fib RVR and he was referred for inpatient telemetry admission.  Recent hospitalized at [facility] from 12/28 until 1//2023 right lower extremity critical limb ischemia status post BKA by vascular on 12/30.  Was just discharged to inpatient rehab here at [facility].  Found to be in RVR RVR admitted for further evaluation.  Patient  also had angiography 1/20 by Dr. [name] with BL diffuse infrapopliteal disease of the left lower extremity, vascular is recommended  left BKA. Patient underwent Left BKA 1/25.  Patient noted to be more hypoxic on 1/23, chest x-ray showed bilateral loculated pleural effusion, pulmonologist consulted for further evaluation. CT chest showed right loculated pleural effusion on right. Right lower lobe with irregular consolidation right lower lobe, he will need PET scan out patient. His oxygen requirement has remain stable. No safe window for thoracentesis. He will need CT chest out patient follow up   He develops anterior flap necrosis of right BKA> vascular planning AKA on 2/1. \ Plan is for CIR when stable.     Assessment & Plan:   Principal Problem:   Critical limb ischemia of left lower extremity/PAD  Active Problems:   COPD (chronic obstructive pulmonary disease) (HCC)   Chronic respiratory failure with hypoxia (HCC)   ESRD (end stage renal disease) (HCC)   Barrett esophagus   Anemia   Mixed hyperlipidemia   S/P BKA (below knee amputation), right (HCC)   Depressive reaction   AF (paroxysmal atrial fibrillation) (HCC)  1-Critical Limb  ischemia of left lower extremity/PAD -Angiography 1/20 by Dr. Luan Pulling revealed diffuse infrapopliteal disease of the left with no revascularization options. -Recent BKA on the right 12/22 -Underwent  left BKA 1/25th -Tolerated procedure well.  -Dressing  removed 1/27. Patient might required further management of Right BKA, has anterior necrotic flap. Plan for Right AKA 1/31  2-A. Fib Continue with  metoprolol and Cardizem.  Appreciate cardiology assistance.  Holding   eliquis for Sx. This will need to be resume when ok by vascular.   3-Acute on chronic hypoxic respiratory failure Loculated bilateral moderate pleural effusion Moderate Pulmonary HTN;  COPD -Pulmonologist consulted. -Incentive Spirometry ordered. -Continue with nebulizer as needed -CT chest 1/23: Patchy nodularity and ground-glass in the dependent RIGHT upper lobe and bilateral lower lobes raising the question of pneumonitis with distribution raising the question of sequela of aspiration. More dense consolidative changes and irregular bronchial structures in the RIGHT lower lobe central to an area of rounded atelectasis is of uncertain significance. Given the more mass like appearance in this area and increasing size of mediastinal lymph nodes over time a possibility of neoplasm is considered. PET scan may be helpful on follow-up in addition to contrasted imaging with CT for further assessment.  Loculated and mildly nodular appearing pleural fluid in the RIGHT chest with increase and with decreased pleural fluid in the LEFT chest compared to prior imaging.  -IR consulted for thoracentesis.  There was no fluid pocket identified that was safe to access by ultrasound thoracentesis. Stable on 2 L oxygen.  Needs follow up CT chest out patient.  4-ESRD on hemodialysis -S/p renal transplant [place] with hemodialysis -Nephrology  following. -Had HD 1/31  Barrett esophagus: Continue with PPI  Anemia:  anemia of  chronic disease: IV iron per nephrology  Hyperlipidemia: Continue with statins  Depression: Continue with Zoloft  Hyponatremia; fluid management with HD>    Severe tricuspid regurgitation, mild mitral regurgitation. Management per cardiology.     He agrees to go to CIR  Estimated body mass index is 22.01 kg/m as calculated from the following:   Height as of this encounter: 5\' 9"  (1.753 m).   Weight as of this encounter: 67.6 kg.   DVT prophylaxis: eliquis.  Code Status: Full code Family Communication: care discussed with patient  Disposition Plan:  Status is: Inpatient  Remains inpatient appropriate because: management of critical limb ischemia. For right AKA 1/01     Consultants:  Nephrology Cardiology Vascular.  Pulmonologist   Procedures:    Antimicrobials:    Subjective: He denies dyspnea. Had BM. No new complaints.  Awaiting decision from vascular team when I saw him   Objective: Vitals:   09/07/21 1600 09/07/21 2020 09/08/21 0054 09/08/21 0425  BP: 106/78 112/80 109/78 120/90  Pulse: (!) 110 (!) 110 (!) 108 (!) 108  Resp:  17 16 18   Temp: 98.9 F (37.2 C) 97.8 F (36.6 C) 97.7 F (36.5 C) 98.1 F (36.7 C)  TempSrc: Oral Oral Oral Oral  SpO2: 100% 100% 100% 100%  Weight:      Height:        Intake/Output Summary (Last 24 hours) at 09/08/2021 0735 Last data filed at 09/07/2021 1500 Gross per 24 hour  Intake 175.05 ml  Output 2000 ml  Net -1824.95 ml    Filed Weights   09/06/21 0422 09/07/21 0753 09/07/21 1135  Weight: 69.5 kg 69.6 kg 67.6 kg    Examination:  General exam: Chronic ill appearing Respiratory system: BL crackles.  Cardiovascular system: S 1, S 2 RRR Systolic murmur Gastrointestinal system: BS present, soft, nt Central nervous system: Alert follows command Extremities: R BKA , left SP BKA     Data Reviewed: I have personally reviewed following labs and imaging studies  CBC: Recent Labs  Lab 09/03/21 0305  09/04/21 0219 09/06/21 0405 09/07/21 0808 09/08/21 0230  WBC 8.3 8.6 7.7 6.5 5.0  HGB 8.1* 8.7* 8.2* 8.4* 8.7*  HCT 26.7* 27.8* 27.3* 27.6* 28.3*  MCV 97.8 98.2 98.9 99.6 99.6  PLT 228 200 192 194 315    Basic Metabolic Panel: Recent Labs  Lab 09/02/21 0718 09/03/21 0305 09/07/21 1430  NA 132* 133* 130*  K 3.5 4.0 4.7  CL 95* 96* 91*  CO2 25 25 25   GLUCOSE 89 85 88  BUN 25* 33* 35*  CREATININE 4.56* 5.41* 5.33*  CALCIUM 8.1* 8.0* 7.8*  PHOS  --   --  4.4    GFR: Estimated Creatinine Clearance: 13 mL/min (A) (by C-G formula based on SCr of 5.33 mg/dL (H)). Liver Function Tests: Recent Labs  Lab 09/07/21 1430  ALBUMIN 1.9*    No results for input(s): LIPASE, AMYLASE in the last 168 hours. No results for input(s): AMMONIA in the last 168 hours. Coagulation Profile: No results for input(s): INR, PROTIME in the last 168 hours. Cardiac Enzymes: No results for input(s): CKTOTAL, CKMB, CKMBINDEX, TROPONINI in the last 168 hours. BNP (last 3 results) No results for input(s): PROBNP in the last 8760 hours. HbA1C: No results for input(s): HGBA1C in the last 72 hours. CBG: No  results for input(s): GLUCAP in the last 168 hours.  Lipid Profile: No results for input(s): CHOL, HDL, LDLCALC, TRIG, CHOLHDL, LDLDIRECT in the last 72 hours.  Thyroid Function Tests: No results for input(s): TSH, T4TOTAL, FREET4, T3FREE, THYROIDAB in the last 72 hours. Anemia Panel: No results for input(s): VITAMINB12, FOLATE, FERRITIN, TIBC, IRON, RETICCTPCT in the last 72 hours. Sepsis Labs: No results for input(s): PROCALCITON, LATICACIDVEN in the last 168 hours.  No results found for this or any previous visit (from the past 240 hour(s)).       Radiology Studies: No results found.      Scheduled Meds:  apixaban  5 mg Oral BID   atorvastatin  10 mg Oral QHS   Chlorhexidine Gluconate Cloth  6 each Topical Q0600   cinacalcet  60 mg Oral Q supper   [START ON 09/09/2021]  Darbepoetin Alfa  100 mcg Intravenous Q Wed-HD   diltiazem  120 mg Oral Daily   docusate sodium  100 mg Oral TID   gabapentin  100 mg Oral BID   hydrocortisone  25 mg Rectal BID   metoprolol tartrate  37.5 mg Oral BID   midodrine  10 mg Oral TID WC   pantoprazole  40 mg Oral Daily   polyethylene glycol  17 g Oral Daily   senna  2 tablet Oral QHS   sertraline  100 mg Oral Daily   sevelamer carbonate  2.4 g Oral TID WC   Continuous Infusions:  sodium chloride 250 mL (09/07/21 1041)   ferric gluconate (FERRLECIT) IVPB 125 mg (09/07/21 1452)     LOS: 9 days    Time spent: 35 Minutes.     Elmarie Shiley, MD [facility]   If 7PM-7AM, please contact night-coverage [email or web address]  09/08/2021, 7:35 AM

## 2021-09-09 ENCOUNTER — Other Ambulatory Visit: Payer: Self-pay

## 2021-09-09 ENCOUNTER — Inpatient Hospital Stay (HOSPITAL_COMMUNITY): Payer: Medicare Other | Admitting: Certified Registered Nurse Anesthetist

## 2021-09-09 ENCOUNTER — Encounter (HOSPITAL_COMMUNITY)
Admission: RE | Disposition: A | Discharge status: Skilled Nursing Facility | Payer: Self-pay | Source: Intra-hospital | Attending: Internal Medicine

## 2021-09-09 DIAGNOSIS — K22719 Barrett's esophagus with dysplasia, unspecified: Secondary | ICD-10-CM

## 2021-09-09 DIAGNOSIS — T8751 Necrosis of amputation stump, right upper extremity: Secondary | ICD-10-CM

## 2021-09-09 HISTORY — PX: AMPUTATION: SHX166

## 2021-09-09 LAB — CBC
HCT: 31.8 % — ABNORMAL LOW (ref 39.0–52.0)
Hemoglobin: 9.7 g/dL — ABNORMAL LOW (ref 13.0–17.0)
MCH: 30.5 pg (ref 26.0–34.0)
MCHC: 30.5 g/dL (ref 30.0–36.0)
MCV: 100 fL (ref 80.0–100.0)
Platelets: 240 10*3/uL (ref 150–400)
RBC: 3.18 MIL/uL — ABNORMAL LOW (ref 4.22–5.81)
RDW: 19.2 % — ABNORMAL HIGH (ref 11.5–15.5)
WBC: 7.1 10*3/uL (ref 4.0–10.5)
nRBC: 0 % (ref 0.0–0.2)

## 2021-09-09 LAB — TYPE AND SCREEN
ABO/RH(D): O POS
Antibody Screen: NEGATIVE

## 2021-09-09 SURGERY — AMPUTATION, ABOVE KNEE
Anesthesia: General | Site: Knee | Laterality: Right

## 2021-09-09 MED ORDER — FENTANYL CITRATE (PF) 250 MCG/5ML IJ SOLN
INTRAMUSCULAR | Status: AC
  Filled 2021-09-09: qty 5

## 2021-09-09 MED ORDER — EPHEDRINE 5 MG/ML INJ
INTRAVENOUS | Status: AC
  Filled 2021-09-09: qty 5

## 2021-09-09 MED ORDER — 0.9 % SODIUM CHLORIDE (POUR BTL) OPTIME
TOPICAL | Status: DC | PRN
  Administered 2021-09-09: 1000 mL

## 2021-09-09 MED ORDER — OXYCODONE HCL 5 MG/5ML PO SOLN: 5.0000 mg | Freq: Once | ORAL | Status: DC | PRN

## 2021-09-09 MED ORDER — PHENYLEPHRINE HCL-NACL 20-0.9 MG/250ML-% IV SOLN
INTRAVENOUS | Status: DC | PRN
  Administered 2021-09-09: 20 ug/min via INTRAVENOUS

## 2021-09-09 MED ORDER — PROPOFOL 10 MG/ML IV BOLUS
INTRAVENOUS | Status: DC | PRN
Start: 2021-09-09 — End: 2021-09-09
  Administered 2021-09-09: 30 mg via INTRAVENOUS
  Administered 2021-09-09: 70 mg via INTRAVENOUS

## 2021-09-09 MED ORDER — SODIUM CHLORIDE 0.9 % IV SOLN: INTRAVENOUS | Status: DC

## 2021-09-09 MED ORDER — DEXAMETHASONE SODIUM PHOSPHATE 10 MG/ML IJ SOLN
INTRAMUSCULAR | Status: AC
  Filled 2021-09-09: qty 3

## 2021-09-09 MED ORDER — ROCURONIUM BROMIDE 10 MG/ML (PF) SYRINGE
PREFILLED_SYRINGE | INTRAVENOUS | Status: DC | PRN
  Administered 2021-09-09: 40 mg via INTRAVENOUS

## 2021-09-09 MED ORDER — LIDOCAINE 2% (20 MG/ML) 5 ML SYRINGE
INTRAMUSCULAR | Status: AC
  Filled 2021-09-09: qty 5

## 2021-09-09 MED ORDER — EPHEDRINE SULFATE-NACL 50-0.9 MG/10ML-% IV SOSY
PREFILLED_SYRINGE | INTRAVENOUS | Status: DC | PRN
  Administered 2021-09-09: 10 mg via INTRAVENOUS

## 2021-09-09 MED ORDER — LIDOCAINE 2% (20 MG/ML) 5 ML SYRINGE
INTRAMUSCULAR | Status: DC | PRN
  Administered 2021-09-09: 50 mg via INTRAVENOUS

## 2021-09-09 MED ORDER — FENTANYL CITRATE (PF) 250 MCG/5ML IJ SOLN
INTRAMUSCULAR | Status: DC | PRN
  Administered 2021-09-09 (×2): 50 ug via INTRAVENOUS

## 2021-09-09 MED ORDER — DARBEPOETIN ALFA 60 MCG/0.3ML IJ SOSY
100.0000 ug | PREFILLED_SYRINGE | INTRAMUSCULAR | Status: DC
  Administered 2021-09-10: 100 ug via INTRAVENOUS
  Filled 2021-09-09 (×2): qty 0.6

## 2021-09-09 MED ORDER — SUGAMMADEX SODIUM 200 MG/2ML IV SOLN
INTRAVENOUS | Status: DC | PRN
  Administered 2021-09-09: 200 mg via INTRAVENOUS

## 2021-09-09 MED ORDER — PROPOFOL 10 MG/ML IV BOLUS
INTRAVENOUS | Status: AC
  Filled 2021-09-09: qty 20

## 2021-09-09 MED ORDER — ONDANSETRON HCL 4 MG/2ML IJ SOLN
INTRAMUSCULAR | Status: AC
  Filled 2021-09-09: qty 6

## 2021-09-09 MED ORDER — MIDAZOLAM HCL 2 MG/2ML IJ SOLN
INTRAMUSCULAR | Status: AC
  Filled 2021-09-09: qty 2

## 2021-09-09 MED ORDER — PHENYLEPHRINE 40 MCG/ML (10ML) SYRINGE FOR IV PUSH (FOR BLOOD PRESSURE SUPPORT)
PREFILLED_SYRINGE | INTRAVENOUS | Status: DC | PRN
  Administered 2021-09-09 (×3): 80 ug via INTRAVENOUS

## 2021-09-09 MED ORDER — ORAL CARE MOUTH RINSE: 15.0000 mL | Freq: Once | OROMUCOSAL | Status: AC

## 2021-09-09 MED ORDER — DARBEPOETIN ALFA 60 MCG/0.3ML IJ SOSY: 100.0000 ug | PREFILLED_SYRINGE | INTRAMUSCULAR | Status: DC

## 2021-09-09 MED ORDER — CHLORHEXIDINE GLUCONATE 0.12 % MT SOLN: 15.0000 mL | Freq: Once | OROMUCOSAL | Status: AC

## 2021-09-09 MED ORDER — HYDROMORPHONE HCL 1 MG/ML IJ SOLN
0.5000 mg | INTRAMUSCULAR | Status: DC | PRN
  Administered 2021-09-09: 0.5 mg via INTRAVENOUS
  Filled 2021-09-09: qty 1

## 2021-09-09 MED ORDER — CEFAZOLIN SODIUM-DEXTROSE 2-3 GM-%(50ML) IV SOLR
INTRAVENOUS | Status: DC | PRN
Start: 2021-09-09 — End: 2021-09-09
  Administered 2021-09-09: 2 g via INTRAVENOUS

## 2021-09-09 MED ORDER — ROCURONIUM BROMIDE 10 MG/ML (PF) SYRINGE
PREFILLED_SYRINGE | INTRAVENOUS | Status: AC
  Filled 2021-09-09: qty 10

## 2021-09-09 MED ORDER — SODIUM CHLORIDE 0.9 % IV SOLN
125.0000 mg | INTRAVENOUS | Status: DC
  Administered 2021-09-10 – 2021-09-12 (×2): 125 mg via INTRAVENOUS
  Filled 2021-09-09 (×2): qty 10

## 2021-09-09 MED ORDER — MIDAZOLAM HCL 2 MG/2ML IJ SOLN
INTRAMUSCULAR | Status: DC | PRN
Start: 2021-09-09 — End: 2021-09-09
  Administered 2021-09-09: 2 mg via INTRAVENOUS

## 2021-09-09 MED ORDER — CHLORHEXIDINE GLUCONATE 0.12 % MT SOLN
OROMUCOSAL | Status: AC
  Administered 2021-09-09: 15 mL via OROMUCOSAL
  Filled 2021-09-09: qty 15

## 2021-09-09 MED ORDER — FENTANYL CITRATE (PF) 100 MCG/2ML IJ SOLN: 25.0000 ug | INTRAMUSCULAR | Status: DC | PRN

## 2021-09-09 MED ORDER — ONDANSETRON HCL 4 MG/2ML IJ SOLN: 4.0000 mg | Freq: Four times a day (QID) | INTRAMUSCULAR | Status: DC | PRN

## 2021-09-09 MED ORDER — DEXAMETHASONE SODIUM PHOSPHATE 10 MG/ML IJ SOLN
INTRAMUSCULAR | Status: DC | PRN
  Administered 2021-09-09: 8 mg via INTRAVENOUS

## 2021-09-09 MED ORDER — CEFAZOLIN SODIUM-DEXTROSE 2-4 GM/100ML-% IV SOLN
INTRAVENOUS | Status: AC
  Filled 2021-09-09: qty 100

## 2021-09-09 MED ORDER — PHENYLEPHRINE 40 MCG/ML (10ML) SYRINGE FOR IV PUSH (FOR BLOOD PRESSURE SUPPORT)
PREFILLED_SYRINGE | INTRAVENOUS | Status: AC
  Filled 2021-09-09: qty 20

## 2021-09-09 MED ORDER — OXYCODONE HCL 5 MG PO TABS: 5.0000 mg | ORAL_TABLET | Freq: Once | ORAL | Status: DC | PRN

## 2021-09-09 SURGICAL SUPPLY — 66 items
BAG COUNTER SPONGE SURGICOUNT (BAG) ×2 IMPLANT
BANDAGE ESMARK 6X9 LF (GAUZE/BANDAGES/DRESSINGS) ×1 IMPLANT
BIT DRILL 5/64X5 DISP (BIT) IMPLANT
BLADE SAGITTAL (BLADE)
BLADE SAGITTAL 25.0X1.19X90 (BLADE) IMPLANT
BLADE SAW GIGLI 510 (BLADE) ×2 IMPLANT
BLADE SAW THK.89X75X18XSGTL (BLADE) IMPLANT
BLADE SURG 21 STRL SS (BLADE) ×2 IMPLANT
BNDG ELASTIC 4X5.8 VLCR STR LF (GAUZE/BANDAGES/DRESSINGS) ×2 IMPLANT
BNDG ELASTIC 6X5.8 VLCR STR LF (GAUZE/BANDAGES/DRESSINGS) ×2 IMPLANT
BNDG ESMARK 6X9 LF (GAUZE/BANDAGES/DRESSINGS) ×2
BNDG GAUZE ELAST 4 BULKY (GAUZE/BANDAGES/DRESSINGS) ×2 IMPLANT
CANISTER SUCT 3000ML PPV (MISCELLANEOUS) ×2 IMPLANT
CHLORAPREP W/TINT 26 (MISCELLANEOUS) ×2 IMPLANT
COVER SURGICAL LIGHT HANDLE (MISCELLANEOUS) ×2 IMPLANT
CUFF TOURN SGL QUICK 24 (TOURNIQUET CUFF)
CUFF TOURN SGL QUICK 34 (TOURNIQUET CUFF) ×1
CUFF TRNQT CYL 24X4X16.5-23 (TOURNIQUET CUFF) IMPLANT
CUFF TRNQT CYL 34X4.125X (TOURNIQUET CUFF) IMPLANT
CUFF TRNQT CYL 34X4X40X1 (TOURNIQUET CUFF) IMPLANT
DRAIN CHANNEL 19F RND (DRAIN) IMPLANT
DRAPE DERMATAC (DRAPES) ×1 IMPLANT
DRAPE HALF SHEET 40X57 (DRAPES) ×2 IMPLANT
DRAPE INCISE IOBAN 66X45 STRL (DRAPES) IMPLANT
DRAPE ORTHO SPLIT 77X108 STRL (DRAPES) ×2
DRAPE SURG ORHT 6 SPLT 77X108 (DRAPES) ×2 IMPLANT
DRESSING PEEL AND PLAC PRVNA20 (GAUZE/BANDAGES/DRESSINGS) IMPLANT
DRESSING PREVENA PLUS CUSTOM (GAUZE/BANDAGES/DRESSINGS) IMPLANT
DRSG PEEL AND PLACE PREVENA 20 (GAUZE/BANDAGES/DRESSINGS) ×2
DRSG PREVENA PLUS CUSTOM (GAUZE/BANDAGES/DRESSINGS)
ELECT CAUTERY BLADE 6.4 (BLADE) ×2 IMPLANT
ELECT REM PT RETURN 9FT ADLT (ELECTROSURGICAL) ×2
ELECTRODE REM PT RTRN 9FT ADLT (ELECTROSURGICAL) ×1 IMPLANT
EVACUATOR SILICONE 100CC (DRAIN) IMPLANT
GAUZE SPONGE 4X4 12PLY STRL (GAUZE/BANDAGES/DRESSINGS) ×2 IMPLANT
GAUZE XEROFORM 5X9 LF (GAUZE/BANDAGES/DRESSINGS) ×2 IMPLANT
GLOVE SURG POLYISO LF SZ8 (GLOVE) ×2 IMPLANT
GOWN STRL REUS W/ TWL LRG LVL3 (GOWN DISPOSABLE) ×2 IMPLANT
GOWN STRL REUS W/ TWL XL LVL3 (GOWN DISPOSABLE) ×1 IMPLANT
GOWN STRL REUS W/TWL LRG LVL3 (GOWN DISPOSABLE) ×2
GOWN STRL REUS W/TWL XL LVL3 (GOWN DISPOSABLE) ×2
KIT BASIN OR (CUSTOM PROCEDURE TRAY) ×2 IMPLANT
KIT TURNOVER KIT B (KITS) ×2 IMPLANT
NS IRRIG 1000ML POUR BTL (IV SOLUTION) ×2 IMPLANT
PACK GENERAL/GYN (CUSTOM PROCEDURE TRAY) ×2 IMPLANT
PAD ARMBOARD 7.5X6 YLW CONV (MISCELLANEOUS) ×4 IMPLANT
PENCIL SMOKE EVACUATOR (MISCELLANEOUS) ×2 IMPLANT
PREVENA RESTOR ARTHOFORM 46X30 (CANNISTER) IMPLANT
STAPLER SKIN 35 REG (STAPLE) ×2 IMPLANT
STAPLER VISISTAT 35W (STAPLE) ×2 IMPLANT
STOCKINETTE IMPERVIOUS LG (DRAPES) ×2 IMPLANT
SUT ETHILON 2 0 PSLX (SUTURE) ×4 IMPLANT
SUT ETHILON 3 0 PS 1 (SUTURE) IMPLANT
SUT FIBERWIRE #5 38 CONV BLUE (SUTURE)
SUT SILK 0 TIES 10X30 (SUTURE) ×2 IMPLANT
SUT SILK 2 0 (SUTURE) ×1
SUT SILK 2 0 SH CR/8 (SUTURE) ×2 IMPLANT
SUT SILK 2-0 18XBRD TIE 12 (SUTURE) ×1 IMPLANT
SUT SILK 3 0 (SUTURE)
SUT SILK 3-0 18XBRD TIE 12 (SUTURE) IMPLANT
SUT VIC AB 2-0 CT1 18 (SUTURE) ×4 IMPLANT
SUTURE FIBERWR #5 38 CONV BLUE (SUTURE) IMPLANT
TAPE UMBILICAL 1/8X30 (MISCELLANEOUS) ×1 IMPLANT
TOWEL GREEN STERILE (TOWEL DISPOSABLE) ×4 IMPLANT
UNDERPAD 30X36 HEAVY ABSORB (UNDERPADS AND DIAPERS) ×2 IMPLANT
WATER STERILE IRR 1000ML POUR (IV SOLUTION) ×2 IMPLANT

## 2021-09-09 NOTE — Progress Notes (Signed)
Pt back from PACU, VSS, incisions clean dry and intact.   Chrisandra Carota, RN 09/09/2021 4:40 PM

## 2021-09-09 NOTE — Op Note (Signed)
DATE OF SERVICE: 09/09/2021  PATIENT:  Scott Castro  66 y.o. male  PRE-OPERATIVE DIAGNOSIS:  necrotic right below knee amputation stump  POST-OPERATIVE DIAGNOSIS:  Same  PROCEDURE:   Right above knee amputation  SURGEON:  Surgeon(s) and Role:    * Cherre Robins, MD - Primary  ASSISTANT: Kateri Plummer, PA-C  An assistant was required to facilitate exposure and expedite the case.  ANESTHESIA:   general  EBL: 159mL  BLOOD ADMINISTERED:none  DRAINS: none   LOCAL MEDICATIONS USED:  NONE  SPECIMEN:  residual right limb  COUNTS: confirmed correct.  TOURNIQUET:  14 minutes to right proximal thigh  PATIENT DISPOSITION:  PACU - hemodynamically stable.   Delay start of Pharmacological VTE agent (>24hrs) due to surgical blood loss or risk of bleeding: no  INDICATION FOR PROCEDURE: Scott Castro is a 66 y.o. male with non-healing right below knee amputation stump. After careful discussion of risks, benefits, and alternatives the patient was offered conversion to right above knee amputation. The patient understood and wished to proceed.  OPERATIVE FINDINGS: unremarkable right above knee amputation  DESCRIPTION OF PROCEDURE: After identification of the patient in the pre-operative holding area, the patient was transferred to the operating room. The patient was positioned supine on the operating room table. Anesthesia was induced. The right leg was prepped and draped in standard fashion. A surgical pause was performed confirming correct patient, procedure, and operative location.  A generous right thigh fishmouth incision was planned on the right lower extremity with a skin marker. A sterile tourniquet was applied to the proximal thigh. An esmarch tourniquet was used to exsanguinate the leg. The pneumatic tourniquet was inflated. An incision was made as planned. The incision was carried down with a #21 scalpel through the subcutaneous tissue, and through the posterior and anterior  compartments of the thigh until the femur was encountered. The periosteum about the femur was elevated as high as possible. The femur was transected with a powered saw. The vascular bundle was found in its typical position, and was suture-ligated proximally using a 2-0 silk suture.  The sciatic nerve was identified in typical position, retracted, transected at the retracted margin, and ligated with a 2-0 silk suture.  The tourniquet was released. Hemostasis was achieved in the surgical bed. The wound was copiously irrigated. A myodesis was performed with 2-0 Vicryl. The wound was closed in layers using 2-0 Vicryl, surgical stapler.  A prevena was applied to the incision.  Upon completion of the case instrument and sharps counts were confirmed correct. The patient was transferred to the PACU in good condition. I was present for all portions of the procedure.  Scott Aline. [name], MD [facility] Phone Number: [phone] 09/09/2021 3:17 PM

## 2021-09-09 NOTE — Progress Notes (Signed)
PROGRESS NOTE    Scott Castro  [id number]:[id number] DOB: May 10, 1956 DOA: 08/30/2021 PCP: Franciso Bend, MD   Brief Narrative: 66 year old past medical history significant for ESRD on dialysis TTS, hypertension, hyperlipidemia, PAF, depression, chronic respiratory failure 2 L of oxygen, PAD recent right BKA on 08/07/2021 secondary to critical limb ischemia discharged to rehab.  He was seen by cardiology in inpatient rehab due to A. fib RVR and he was referred for inpatient telemetry admission.  Recent hospitalized at [facility] from 12/28 until 1//2023 right lower extremity critical limb ischemia status post BKA by vascular on 12/30.  Was just discharged to inpatient rehab here at [facility].  Found to be in RVR RVR admitted for further evaluation.  Patient  also had angiography 1/20 by Dr. [name] with BL diffuse infrapopliteal disease of the left lower extremity, vascular is recommended  left BKA. Patient underwent Left BKA 1/25.  Patient noted to be more hypoxic on 1/23, chest x-ray showed bilateral loculated pleural effusion, pulmonologist consulted for further evaluation. CT chest showed right loculated pleural effusion on right. Right lower lobe with irregular consolidation right lower lobe, he will need PET scan out patient. His oxygen requirement has remain stable. No safe window for thoracentesis. He will need CT chest out patient follow up   He develops anterior flap necrosis of right BKA> vascular planning AKA on 2/1. \ Plan is for CIR when stable.     Assessment & Plan:   Principal Problem:   Critical limb ischemia of left lower extremity/PAD  Active Problems:   COPD (chronic obstructive pulmonary disease) (HCC)   Chronic respiratory failure with hypoxia (HCC)   ESRD (end stage renal disease) (HCC)   Barrett esophagus   Anemia   Mixed hyperlipidemia   S/P BKA (below knee amputation), right (HCC)   Depressive reaction   AF (paroxysmal atrial fibrillation) (HCC)  1-Critical Limb  ischemia of left lower extremity/PAD -Angiography 1/20 by Dr. [name] revealed diffuse infrapopliteal disease of the left with no revascularization options. -Recent BKA on the right 12/22 -Underwent  left BKA 1/25th -Tolerated procedure well.  -Dressing  removed 1/27. Unfortunately developed necrotic flap on right BKA and was transitioned to right AKA on 2/1 -Further postop care per vascular surgery  2-A. Fib Continue with  metoprolol and Cardizem.  Appreciate cardiology assistance.  Holding   eliquis for Sx. This will need to be resume when ok by vascular.   3-Acute on chronic hypoxic respiratory failure Loculated bilateral moderate pleural effusion Moderate Pulmonary HTN;  COPD -Pulmonologist consulted. -Incentive Spirometry ordered. -Continue with nebulizer as needed -CT chest 1/23: Patchy nodularity and ground-glass in the dependent RIGHT upper lobe and bilateral lower lobes raising the question of pneumonitis with distribution raising the question of sequela of aspiration. More dense consolidative changes and irregular bronchial structures in the RIGHT lower lobe central to an area of rounded atelectasis is of uncertain significance. Given the more mass like appearance in this area and increasing size of mediastinal lymph nodes over time a possibility of neoplasm is considered. PET scan may be helpful on follow-up in addition to contrasted imaging with CT for further assessment.  Loculated and mildly nodular appearing pleural fluid in the RIGHT chest with increase and with decreased pleural fluid in the LEFT chest compared to prior imaging.  -IR consulted for thoracentesis.  There was no fluid pocket identified that was safe to access by ultrasound thoracentesis. Stable on 2 L oxygen.  Needs follow up CT chest  out patient.    4-ESRD on hemodialysis -S/p renal transplant [place] with hemodialysis -Nephrology  following. -Had HD 1/31  Barrett esophagus: Continue with  PPI  Anemia:  anemia of chronic disease: IV iron per nephrology Hemoglobin currently stable  Hyperlipidemia: Continue with statins  Depression: Continue with Zoloft  Hyponatremia; fluid management with HD>    Severe tricuspid regurgitation, mild mitral regurgitation. Management per cardiology.     He agrees to go to CIR  Estimated body mass index is 22.89 kg/m as calculated from the following:   Height as of this encounter: 5\' 9"  (1.753 m).   Weight as of this encounter: 70.3 kg.   DVT prophylaxis: eliquis.  Code Status: Full code Family Communication: care discussed with patient  Disposition Plan:  Status is: Inpatient  Remains inpatient appropriate because: management of critical limb ischemia.      Consultants:  Nephrology Cardiology Vascular.  Pulmonologist   Procedures:  Right AKA 2/1  Antimicrobials:    Subjective: Patient is seen in his room postoperatively.  Recently got pain medications.  Says that he does have pain in his stump.  Objective: Vitals:   09/09/21 1553 09/09/21 1608 09/09/21 1640 09/09/21 2057  BP: 121/82 115/77 119/77 121/78  Pulse: (!) 102 (!) 102 100 100  Resp: 15 16 17 16   Temp:  97.9 F (36.6 C) (!) 97.5 F (36.4 C) 97.8 F (36.6 C)  TempSrc:   Oral Axillary  SpO2: 99% 96% 97% 96%  Weight:      Height:        Intake/Output Summary (Last 24 hours) at 09/09/2021 2106 Last data filed at 09/09/2021 1700 Gross per 24 hour  Intake 62.33 ml  Output 100 ml  Net -37.67 ml   Filed Weights   09/07/21 0753 09/07/21 1135 09/09/21 1258  Weight: 69.6 kg 67.6 kg 70.3 kg    Examination:  General exam: Chronic ill appearing Respiratory system: BL crackles.  Cardiovascular system: S 1, S 2 RRR Systolic murmur Gastrointestinal system: BS present, soft, nt Central nervous system: Alert follows command Extremities: R AKA , left SP BKA     Data Reviewed: I have personally reviewed following labs and imaging  studies  CBC: Recent Labs  Lab 09/04/21 0219 09/06/21 0405 09/07/21 0808 09/08/21 0230 09/09/21 0124  WBC 8.6 7.7 6.5 5.0 7.1  HGB 8.7* 8.2* 8.4* 8.7* 9.7*  HCT 27.8* 27.3* 27.6* 28.3* 31.8*  MCV 98.2 98.9 99.6 99.6 100.0  PLT 200 192 194 187 601   Basic Metabolic Panel: Recent Labs  Lab 09/03/21 0305 09/07/21 1430  NA 133* 130*  K 4.0 4.7  CL 96* 91*  CO2 25 25  GLUCOSE 85 88  BUN 33* 35*  CREATININE 5.41* 5.33*  CALCIUM 8.0* 7.8*  PHOS  --  4.4   GFR: Estimated Creatinine Clearance: 13.6 mL/min (A) (by C-G formula based on SCr of 5.33 mg/dL (H)). Liver Function Tests: Recent Labs  Lab 09/07/21 1430  ALBUMIN 1.9*   No results for input(s): LIPASE, AMYLASE in the last 168 hours. No results for input(s): AMMONIA in the last 168 hours. Coagulation Profile: No results for input(s): INR, PROTIME in the last 168 hours. Cardiac Enzymes: No results for input(s): CKTOTAL, CKMB, CKMBINDEX, TROPONINI in the last 168 hours. BNP (last 3 results) No results for input(s): PROBNP in the last 8760 hours. HbA1C: No results for input(s): HGBA1C in the last 72 hours. CBG: No results for input(s): GLUCAP in the last 168 hours.  Lipid  Profile: No results for input(s): CHOL, HDL, LDLCALC, TRIG, CHOLHDL, LDLDIRECT in the last 72 hours.  Thyroid Function Tests: No results for input(s): TSH, T4TOTAL, FREET4, T3FREE, THYROIDAB in the last 72 hours. Anemia Panel: No results for input(s): VITAMINB12, FOLATE, FERRITIN, TIBC, IRON, RETICCTPCT in the last 72 hours. Sepsis Labs: No results for input(s): PROCALCITON, LATICACIDVEN in the last 168 hours.  No results found for this or any previous visit (from the past 240 hour(s)).       Radiology Studies: No results found.      Scheduled Meds:  atorvastatin  10 mg Oral QHS   Chlorhexidine Gluconate Cloth  6 each Topical Q0600   cinacalcet  60 mg Oral Q supper   [START ON 09/10/2021] Darbepoetin Alfa  100 mcg Intravenous Q  Thu-HD   diltiazem  120 mg Oral Daily   docusate sodium  100 mg Oral TID   gabapentin  100 mg Oral BID   hydrocortisone  25 mg Rectal BID   metoprolol tartrate  37.5 mg Oral BID   midodrine  10 mg Oral TID WC   pantoprazole  40 mg Oral Daily   polyethylene glycol  17 g Oral Daily   senna  2 tablet Oral QHS   sertraline  100 mg Oral Daily   sevelamer carbonate  2.4 g Oral TID WC   Continuous Infusions:  sodium chloride 250 mL (09/07/21 1041)   ceFAZolin     [START ON 09/10/2021] ferric gluconate (FERRLECIT) IVPB       LOS: 10 days    Time spent: 35 Minutes.     Kathie Dike, MD [facility]   If 7PM-7AM, please contact night-coverage [email or web address]  09/09/2021, 9:06 PM

## 2021-09-09 NOTE — Transfer of Care (Signed)
Immediate Anesthesia Transfer of Care Note  Patient: Scott Castro  Procedure(s) Performed: RIGHT AMPUTATION ABOVE KNEE (Right: Knee)  Patient Location: PACU  Anesthesia Type:General  Level of Consciousness: drowsy  Airway & Oxygen Therapy: Patient Spontanous Breathing and Patient connected to face mask oxygen  Post-op Assessment: Report given to RN and Post -op Vital signs reviewed and stable  Post vital signs: Reviewed and stable  Last Vitals:  Vitals Value Taken Time  BP 100/73 09/09/21 1511  Temp    Pulse 100 09/09/21 1514  Resp 13 09/09/21 1514  SpO2 100 % 09/09/21 1514  Vitals shown include unvalidated device data.  Last Pain:  Vitals:   09/09/21 1318  TempSrc:   PainSc: 1       Patients Stated Pain Goal: 0 (34/03/70 9643)  Complications: No notable events documented.

## 2021-09-09 NOTE — Anesthesia Preprocedure Evaluation (Signed)
Anesthesia Evaluation  Patient identified by MRN, date of birth, ID band Patient awake    Reviewed: Allergy & Precautions, H&P , NPO status , Patient's Chart, lab work & pertinent test results  Airway Mallampati: II   Neck ROM: full    Dental   Pulmonary COPD,    breath sounds clear to auscultation       Cardiovascular + Peripheral Vascular Disease   Rhythm:regular Rate:Normal     Neuro/Psych Seizures -,  PSYCHIATRIC DISORDERS Depression    GI/Hepatic GERD  ,  Endo/Other    Renal/GU ESRF and DialysisRenal disease     Musculoskeletal  (+) Arthritis ,   Abdominal   Peds  Hematology   Anesthesia Other Findings   Reproductive/Obstetrics                             Anesthesia Physical Anesthesia Plan  ASA: 3  Anesthesia Plan: General   Post-op Pain Management:    Induction: Intravenous  PONV Risk Score and Plan: 2 and Ondansetron, Dexamethasone, Midazolam and Treatment may vary due to age or medical condition  Airway Management Planned: Oral ETT  Additional Equipment:   Intra-op Plan:   Post-operative Plan: Extubation in OR  Informed Consent: I have reviewed the patients History and Physical, chart, labs and discussed the procedure including the risks, benefits and alternatives for the proposed anesthesia with the patient or authorized representative who has indicated his/her understanding and acceptance.     Dental advisory given  Plan Discussed with: CRNA, Anesthesiologist and Surgeon  Anesthesia Plan Comments:         Anesthesia Quick Evaluation

## 2021-09-09 NOTE — Progress Notes (Signed)
[facility] [place] PROGRESS NOTE  ASSESSMENT / PLAN: Scott Castro is a 66 y.o. male with necrotic right below knee amputation. Plan revision to above knee amputation today in OR.  SUBJECTIVE: No complaints. Reviewed operative plan. He is understanding.  OBJECTIVE: BP 112/69    Pulse (!) 104    Temp 98.2 F (36.8 C) (Oral)    Resp 16    Ht 5\' 9"  (1.753 m)    Wt 70.3 kg    SpO2 98%    BMI 22.89 kg/m   Intake/Output Summary (Last 24 hours) at 09/09/2021 1343 Last data filed at 09/09/2021 1121 Gross per 24 hour  Intake 120 ml  Output 0 ml  Net 120 ml    Constitutional: Chronically ill appearing. no acute distress. Vascular: unchanged appearance of right BKA stump  CBC Latest Ref Rng & Units 09/09/2021 09/08/2021 09/07/2021  WBC 4.0 - 10.5 K/uL 7.1 5.0 6.5  Hemoglobin 13.0 - 17.0 g/dL 9.7(L) 8.7(L) 8.4(L)  Hematocrit 39.0 - 52.0 % 31.8(L) 28.3(L) 27.6(L)  Platelets 150 - 400 K/uL 240 187 194     CMP Latest Ref Rng & Units 09/07/2021 09/03/2021 09/02/2021  Glucose 70 - 99 mg/dL 88 85 89  BUN 8 - 23 mg/dL 35(H) 33(H) 25(H)  Creatinine 0.61 - 1.24 mg/dL 5.33(H) 5.41(H) 4.56(H)  Sodium 135 - 145 mmol/L 130(L) 133(L) 132(L)  Potassium 3.5 - 5.1 mmol/L 4.7 4.0 3.5  Chloride 98 - 111 mmol/L 91(L) 96(L) 95(L)  CO2 22 - 32 mmol/L 25 25 25   Calcium 8.9 - 10.3 mg/dL 7.8(L) 8.0(L) 8.1(L)  Total Protein 6.5 - 8.1 g/dL - - -  Total Bilirubin 0.3 - 1.2 mg/dL - - -  Alkaline Phos 38 - 126 U/L - - -  AST 15 - 41 U/L - - -  ALT 0 - 44 U/L - - -    Estimated Creatinine Clearance: 13.6 mL/min (A) (by C-G formula based on SCr of 5.33 mg/dL (H)).  [name], MD [facility] Phone Number: [phone] 09/09/2021 1:43 PM

## 2021-09-09 NOTE — Anesthesia Procedure Notes (Signed)
Procedure Name: Intubation Date/Time: 09/09/2021 2:05 PM Performed by: Minerva Ends, CRNA Pre-anesthesia Checklist: Patient identified, Emergency Drugs available, Suction available and Patient being monitored Patient Re-evaluated:Patient Re-evaluated prior to induction Oxygen Delivery Method: Circle system utilized Preoxygenation: Pre-oxygenation with 100% oxygen Induction Type: IV induction Ventilation: Mask ventilation without difficulty Laryngoscope Size: Mac and 3 Grade View: Grade II Tube type: Oral Tube size: 7.0 mm Number of attempts: 1 Airway Equipment and Method: Stylet Placement Confirmation: ETT inserted through vocal cords under direct vision, positive ETCO2 and breath sounds checked- equal and bilateral Secured at: 23 cm Tube secured with: Tape Dental Injury: Teeth and Oropharynx as per pre-operative assessment

## 2021-09-09 NOTE — Progress Notes (Signed)
[facility] Progress Note   66 y.o. male PMH COPD Barrett's, hyperparathyroidism, ESRD, failed renal transplant, PAF on Eliquis p/w ischemic foot ulcer -> BKA on 12/30.  Patient receives his dialysis treatments at [place] with [facility].   Dialysis at [facility] with [facility] EDW 68.4kg? EDW before the amputation was 72kg     Assessment/ Plan:   ESRD - TTS at [facility] w/ [facility] - establishing new EDW  -No further postdialysis bleeding -  Uses elisio dialyzer at [facility] but has been tolerating our standard optiflux dialyzer here -Typically TTS but recently changed to MWF. Has been MWF here, holding HD today given surgery. Likely Thursday, Saturday, then Monday  [name]- phos at goal on renvela. Ca corrects to normal.  Anemia - on ESA and s/p iron load.  Required transfusion this hosp.  ^ ESA dose. Hgb improved to 9.7 today.  PAD with critical limb ischemia s/p rt BKA for gangrene - now will require LBKA -  done 1/25. Now due to poor healing will require right AKA.  PAF - rate controlled and  Eliquis typically. On dilt and metop - cont even prior to dialysis.  Was having some RVR periodically during this admission and cardiology has been consulted - titrating BB and watching BP.  He's on midodrine now as well.  GERD COPD   9. Dispo: private transport to [place] HD unit from his [place] home too costly, will switch from [name] to [facility]- MWF-  has spot.   Possibly going back to CIR or SNF    Subjective:    Patient resting in bed today.  No complaints.  Planning for AKA today  BP 114/82 (BP Location: Right Arm)    Pulse (!) 105    Temp 98.4 F (36.9 C) (Oral)    Resp 15    Ht 5\' 9"  (1.753 m)    Wt 67.6 kg    SpO2 100%    BMI 22.01 kg/m   Intake/Output Summary (Last 24 hours) at 09/09/2021 1011 Last data filed at 09/09/2021 0800 Gross per 24 hour  Intake 240 ml  Output 0 ml  Net 240 ml   Weight change:   Physical Exam: GEN: Lying in  bed, no distress HEENT: MMM, no nasal discharge LUNGS: Bilateral chest rise with no increased work of breathing CV: Tachycardia, systolic murmur present ABD: SNDNT +BS  EXT:R BKA covered in gauze, L new BKA-  wrapped  ACCESS: Positive bruit  Imaging: No results found.  Labs: BMET Recent Labs  Lab 09/03/21 0305 09/07/21 1430  NA 133* 130*  K 4.0 4.7  CL 96* 91*  CO2 25 25  GLUCOSE 85 88  BUN 33* 35*  CREATININE 5.41* 5.33*  CALCIUM 8.0* 7.8*  PHOS  --  4.4   CBC Recent Labs  Lab 09/06/21 0405 09/07/21 0808 09/08/21 0230 09/09/21 0124  WBC 7.7 6.5 5.0 7.1  HGB 8.2* 8.4* 8.7* 9.7*  HCT 27.3* 27.6* 28.3* 31.8*  MCV 98.9 99.6 99.6 100.0  PLT 192 194 187 240    Medications:     atorvastatin  10 mg Oral QHS   Chlorhexidine Gluconate Cloth  6 each Topical Q0600   cinacalcet  60 mg Oral Q supper   [START ON 09/10/2021] Darbepoetin Alfa  100 mcg Intravenous Q Thu-HD   diltiazem  120 mg Oral Daily   docusate sodium  100 mg Oral TID   gabapentin  100 mg Oral BID   hydrocortisone  25 mg Rectal BID   metoprolol tartrate  37.5 mg Oral BID   midodrine  10 mg Oral TID WC   pantoprazole  40 mg Oral Daily   polyethylene glycol  17 g Oral Daily   senna  2 tablet Oral QHS   sertraline  100 mg Oral Daily   sevelamer carbonate  2.4 g Oral TID WC    [name]  [facility]

## 2021-09-09 NOTE — Progress Notes (Signed)
Physical Therapy Treatment Patient Details Name: Scott Castro MRN: [id number] DOB: November 29, 1955 Today's Date: 09/09/2021   History of Present Illness 66 yo male presenting on 1/20 from [facility] with bilateral diffuse infrapopliteal disease of the left lower extremity. S/p L BKA 09/02/21. Recent hospitalization at [facility] (08/05/21-08/12/21) for R BKA on 12/30 with d/c to [facility]. Planning for revision of R BKA on 2/1. PMH including  ESRD on dialysis TTS, hypertension, hyperlipidemia, PAF, depression, and chronic respiratory failure 2 L of oxygen.    PT Comments    Plan for surgery today. Pt agreeable to work on therapeutic exercises for BUE strengthening. Provided with blue and green theraband and pt performed with good carryover (see below for further details). Will reassess post op.    Recommendations for follow up therapy are one component of a multi-disciplinary discharge planning process, led by the attending physician.  Recommendations may be updated based on patient status, additional functional criteria and insurance authorization.  Follow Up Recommendations  Acute inpatient rehab (3hours/day)     Assistance Recommended at Discharge Frequent or constant Supervision/Assistance  Patient can return home with the following Assistance with cooking/housework;Assist for transportation;A lot of help with walking and/or transfers;A lot of help with bathing/dressing/bathroom;Help with stairs or ramp for entrance   Equipment Recommendations  Wheelchair (measurements PT);Wheelchair cushion (measurements PT);Other (comment) (slide board)    Recommendations for Other Services Rehab consult     Precautions / Restrictions Precautions Precautions: Fall Restrictions Weight Bearing Restrictions: Yes RLE Weight Bearing: Non weight bearing LLE Weight Bearing: Non weight bearing     Mobility  Bed Mobility               General bed mobility comments: deferred by pt    Transfers                         Ambulation/Gait                   Stairs             Wheelchair Mobility    Modified Rankin (Stroke Patients Only)       Balance                                            Cognition Arousal/Alertness: Awake/alert Behavior During Therapy: WFL for tasks assessed/performed Overall Cognitive Status: Within Functional Limits for tasks assessed                                          Exercises General Exercises - Upper Extremity Shoulder Flexion: Both, 10 reps, Supine, Theraband Theraband Level (Shoulder Flexion): Level 3 (Green) Shoulder Horizontal ABduction: Both, 10 reps, Supine, Theraband Theraband Level (Shoulder Horizontal Abduction): Level 4 (Blue) Shoulder Horizontal ADduction: Both, 10 reps, Supine, Theraband Theraband Level (Shoulder Horizontal Adduction): Level 4 (Blue) Elbow Flexion: Both, 15 reps, Supine, Theraband Theraband Level (Elbow Flexion): Level 4 (Blue) Elbow Extension: Both, 10 reps, Supine, Theraband Theraband Level (Elbow Extension): Level 4 (Blue) Other Exercises Other Exercises: Supine: bilateral shoulder external rotation x 10 (blue theraband), D2 x 10 (green theraband), bilat scapular punches x 10 (blue theraband)    General Comments        Pertinent Vitals/Pain Pain Assessment Pain Assessment: Faces  Faces Pain Scale: No hurt    Home Living                          Prior Function            PT Goals (current goals can now be found in the care plan section) Acute Rehab PT Goals Patient Stated Goal: go to acute rehab Potential to Achieve Goals: Good Progress towards PT goals: Progressing toward goals    Frequency    Min 3X/week      PT Plan Current plan remains appropriate    Co-evaluation              AM-PAC PT "6 Clicks" Mobility   Outcome Measure  Help needed turning from your back to your side while in a flat bed without using bedrails?: A  Little Help needed moving from lying on your back to sitting on the side of a flat bed without using bedrails?: A Little Help needed moving to and from a bed to a chair (including a wheelchair)?: A Little Help needed standing up from a chair using your arms (e.g., wheelchair or bedside chair)?: Total Help needed to walk in hospital room?: Total Help needed climbing 3-5 steps with a railing? : Total 6 Click Score: 12    End of Session   Activity Tolerance: Patient tolerated treatment well Patient left: in bed;with call bell/phone within reach Nurse Communication: Mobility status PT Visit Diagnosis: Muscle weakness (generalized) (M62.81);Other abnormalities of gait and mobility (R26.89);Pain Pain - Right/Left: Left Pain - part of body: Leg     Time: 0076-2263 PT Time Calculation (min) (ACUTE ONLY): 28 min  Charges:  $Therapeutic Exercise: 23-37 mins                     Wyona Almas, PT, DPT Acute Rehabilitation Services Pager [phone] Office [phone]    Deno Etienne 09/09/2021, 1:47 PM

## 2021-09-10 ENCOUNTER — Encounter (HOSPITAL_COMMUNITY): Payer: Self-pay | Admitting: Vascular Surgery

## 2021-09-10 DIAGNOSIS — T8751 Necrosis of amputation stump, right upper extremity: Secondary | ICD-10-CM

## 2021-09-10 LAB — CBC
HCT: 30.3 % — ABNORMAL LOW (ref 39.0–52.0)
Hemoglobin: 9.4 g/dL — ABNORMAL LOW (ref 13.0–17.0)
MCH: 30.7 pg (ref 26.0–34.0)
MCHC: 31 g/dL (ref 30.0–36.0)
MCV: 99 fL (ref 80.0–100.0)
Platelets: 248 K/uL (ref 150–400)
RBC: 3.06 MIL/uL — ABNORMAL LOW (ref 4.22–5.81)
RDW: 19.2 % — ABNORMAL HIGH (ref 11.5–15.5)
WBC: 8.7 K/uL (ref 4.0–10.5)
nRBC: 0 % (ref 0.0–0.2)

## 2021-09-10 LAB — RENAL FUNCTION PANEL
Albumin: 1.8 g/dL — ABNORMAL LOW (ref 3.5–5.0)
Anion gap: 12 (ref 5–15)
BUN: 44 mg/dL — ABNORMAL HIGH (ref 8–23)
CO2: 23 mmol/L (ref 22–32)
Calcium: 7.3 mg/dL — ABNORMAL LOW (ref 8.9–10.3)
Chloride: 97 mmol/L — ABNORMAL LOW (ref 98–111)
Creatinine, Ser: 6.1 mg/dL — ABNORMAL HIGH (ref 0.61–1.24)
GFR, Estimated: 9 mL/min — ABNORMAL LOW
Glucose, Bld: 122 mg/dL — ABNORMAL HIGH (ref 70–99)
Phosphorus: 6.4 mg/dL — ABNORMAL HIGH (ref 2.5–4.6)
Potassium: 5.6 mmol/L — ABNORMAL HIGH (ref 3.5–5.1)
Sodium: 132 mmol/L — ABNORMAL LOW (ref 135–145)

## 2021-09-10 MED ORDER — APIXABAN 5 MG PO TABS
5.0000 mg | ORAL_TABLET | Freq: Two times a day (BID) | ORAL | Status: DC
  Administered 2021-09-10 – 2021-09-15 (×11): 5 mg via ORAL
  Filled 2021-09-10 (×11): qty 1

## 2021-09-10 MED ORDER — SEVELAMER CARBONATE 800 MG PO TABS
2400.0000 mg | ORAL_TABLET | Freq: Three times a day (TID) | ORAL | Status: DC
  Administered 2021-09-10 – 2021-09-12 (×5): 2400 mg via ORAL
  Filled 2021-09-10 (×11): qty 3

## 2021-09-10 MED ORDER — DARBEPOETIN ALFA 60 MCG/0.3ML IJ SOSY: 100.0000 ug | PREFILLED_SYRINGE | INTRAMUSCULAR | Status: DC

## 2021-09-10 NOTE — Anesthesia Postprocedure Evaluation (Signed)
Anesthesia Post Note  Patient: Scott Castro  Procedure(s) Performed: RIGHT AMPUTATION ABOVE KNEE (Right: Knee)     Patient location during evaluation: PACU Anesthesia Type: General Level of consciousness: awake and alert Pain management: pain level controlled Vital Signs Assessment: post-procedure vital signs reviewed and stable Respiratory status: spontaneous breathing, nonlabored ventilation, respiratory function stable and patient connected to nasal cannula oxygen Cardiovascular status: blood pressure returned to baseline and stable Postop Assessment: no apparent nausea or vomiting Anesthetic complications: no   No notable events documented.  Last Vitals:  Vitals:   09/10/21 0032 09/10/21 0331  BP: 108/83 105/78  Pulse: 99 99  Resp: (!) 21 16  Temp: 36.5 C 36.5 C  SpO2: 98% 98%    Last Pain:  Vitals:   09/10/21 0331  TempSrc: Oral  PainSc:                  Hoyt S

## 2021-09-10 NOTE — Progress Notes (Signed)
°  Progress Note    09/10/2021 7:21 AM 1 Day Post-Op  Subjective:  no complaints.  Afebrile  Vitals:   09/10/21 0032 09/10/21 0331  BP: 108/83 105/78  Pulse: 99 99  Resp: (!) 21 16  Temp: 97.7 F (36.5 C) 97.7 F (36.5 C)  SpO2: 98% 98%    Physical Exam: Incisions:  pravena with stump sock in place right AKA.  Left BKA bandage is clean.   CBC    Component Value Date/Time   WBC 8.7 09/10/2021 0334   RBC 3.06 (L) 09/10/2021 0334   HGB 9.4 (L) 09/10/2021 0334   HGB 11.4 (L) 06/11/2012 0501   HCT 30.3 (L) 09/10/2021 0334   HCT 34.6 (L) 06/11/2012 0501   PLT 248 09/10/2021 0334   PLT 251 06/11/2012 0501   MCV 99.0 09/10/2021 0334   MCV 95 06/11/2012 0501   MCH 30.7 09/10/2021 0334   MCHC 31.0 09/10/2021 0334   RDW 19.2 (H) 09/10/2021 0334   RDW 14.4 06/11/2012 0501   LYMPHSABS 0.8 08/26/2021 1004   LYMPHSABS 1.2 06/11/2012 0501   MONOABS 0.9 08/26/2021 1004   MONOABS 0.7 06/11/2012 0501   EOSABS 0.0 08/26/2021 1004   EOSABS 0.2 06/11/2012 0501   BASOSABS 0.0 08/26/2021 1004   BASOSABS 0.1 06/11/2012 0501    BMET    Component Value Date/Time   NA 132 (L) 09/10/2021 0334   NA 142 06/10/2012 0924   K 5.6 (H) 09/10/2021 0334   K 4.5 06/16/2013 1315   CL 97 (L) 09/10/2021 0334   CL 102 06/10/2012 0924   CO2 23 09/10/2021 0334   CO2 29 06/10/2012 0924   GLUCOSE 122 (H) 09/10/2021 0334   GLUCOSE 83 06/10/2012 0924   BUN 44 (H) 09/10/2021 0334   BUN 35 (H) 06/10/2012 0924   CREATININE 6.10 (H) 09/10/2021 0334   CREATININE 10.29 (H) 06/10/2012 0924   CALCIUM 7.3 (L) 09/10/2021 0334   CALCIUM 9.2 06/10/2012 0924   GFRNONAA 9 (L) 09/10/2021 0334   GFRNONAA 5 (L) 06/10/2012 0924   GFRAA 9 (L) 09/18/2019 0430   GFRAA 6 (L) 06/10/2012 0924    INR    Component Value Date/Time   INR 1.7 (H) 08/05/2021 1846     Intake/Output Summary (Last 24 hours) at 09/10/2021 0721 Last data filed at 09/09/2021 1700 Gross per 24 hour  Intake 62.33 ml  Output 100 ml  Net  -37.67 ml     Assessment/Plan:  66 y.o. male is s/p right above knee amputation  1 Day Post-Op and left BKA 8 days post op  -bandage in place with pravena vac-will leave in place one week or until dc. -ok for discharge to CIR from vascular standpoint -ok to restart anticoagulation from vascular standpoint. -f/u in 4 weeks for staple removal - our office will arrange appt.     Leontine Locket, PA-C [facility] [phone] 09/10/2021 7:21 AM

## 2021-09-10 NOTE — Progress Notes (Signed)
Inpatient Rehab Admissions Coordinator:   Met with patient at bedside to f/u on discussions regarding CIR.  Pt did not recall conversation last week with myself or with Thurmond Butts, LCSW, regarding CIR LOS ~2 weeks.  Again, pt upset over idea of going home before he has prosthetics.  I explained that typical trajectory for CIR patients post-amputation is 2 weeks on CIR and then home w/c level to further recover until they are able to be fitted with prosthetics.  Pt seemed to have a hard time grasping the idea of returning home w/c level.  I asked multiple times whether he preferred short term CIR or, potentially, a little longer rehab course at SNF, but pt only repeatedly emphasizing disappointment that he would not be able to remain somewhere until he could walk.  I'm concerned about his processing, whether that is related to medication versus difficulty coping versus cognitive deficit, I am not sure.  He will certainly not be able to discharge home alone following 2 weeks of rehab, based on my conversation with him today.  Also note declining mobility with PT today.  I asked Caren Griffins to see him again tomorrow and hopefully we can nail down a preference for rehab, but I believe he would most likely be better served in a SNF setting where his rehab course may be longer than 2 weeks.  Will f/u tomorrow.   Shann Medal, PT, DPT Admissions Coordinator [phone] 09/10/21  3:45 PM

## 2021-09-10 NOTE — Progress Notes (Signed)
PROGRESS NOTE    Scott Castro  PIR:[id number] DOB: [birth year] DOA: 08/30/2021 PCP: Franciso Bend, MD   Brief Narrative: 66 year old past medical history significant for ESRD on dialysis TTS, hypertension, hyperlipidemia, PAF, depression, chronic respiratory failure 2 L of oxygen, PAD recent right BKA on 08/07/2021 secondary to critical limb ischemia discharged to rehab.  He was seen by cardiology in inpatient rehab due to A. fib RVR and he was referred for inpatient telemetry admission.  Recent hospitalized at [facility] from 12/28 until 1//2023 right lower extremity critical limb ischemia status post BKA by vascular on 12/30.  Was just discharged to inpatient rehab here at [facility].  Found to be in RVR RVR admitted for further evaluation.  Patient  also had angiography 1/20 by Dr. [name] with BL diffuse infrapopliteal disease of the left lower extremity, vascular is recommended  left BKA. Patient underwent Left BKA 1/25.  Patient noted to be more hypoxic on 1/23, chest x-ray showed bilateral loculated pleural effusion, pulmonologist consulted for further evaluation. CT chest showed right loculated pleural effusion on right. Right lower lobe with irregular consolidation right lower lobe, he will need PET scan out patient. His oxygen requirement has remain stable. No safe window for thoracentesis. He will need CT chest out patient follow up   He develops anterior flap necrosis of right BKA> vascular planning AKA on 2/1. \ Plan is for CIR versus SNF when stable.     Assessment & Plan:   Principal Problem:   Critical limb ischemia of left lower extremity/PAD  Active Problems:   COPD (chronic obstructive pulmonary disease) (HCC)   Chronic respiratory failure with hypoxia (HCC)   ESRD (end stage renal disease) (HCC)   Barrett esophagus   Anemia   Mixed hyperlipidemia   S/P BKA (below knee amputation), right (HCC)   Depressive reaction   AF (paroxysmal atrial fibrillation) (HCC)  1-Critical  Limb ischemia of left lower extremity/PAD -Angiography 1/20 by Dr. [name] revealed diffuse infrapopliteal disease of the left with no revascularization options. -Recent BKA on the right 12/22 -Underwent  left BKA 1/25th -Tolerated procedure well.  -Dressing  removed 1/27. Unfortunately developed necrotic flap on right BKA and was transitioned to right AKA on 2/1 -Further postop care per vascular surgery  2-A. Fib Continue with  metoprolol and Cardizem.  Appreciate cardiology assistance.  On anticoagulation with Eliquis  3-Acute on chronic hypoxic respiratory failure Loculated bilateral moderate pleural effusion Moderate Pulmonary HTN;  COPD -Pulmonologist consulted. -Incentive Spirometry ordered. -Continue with nebulizer as needed -CT chest 1/23: Patchy nodularity and ground-glass in the dependent RIGHT upper lobe and bilateral lower lobes raising the question of pneumonitis with distribution raising the question of sequela of aspiration. More dense consolidative changes and irregular bronchial structures in the RIGHT lower lobe central to an area of rounded atelectasis is of uncertain significance. Given the more mass like appearance in this area and increasing size of mediastinal lymph nodes over time a possibility of neoplasm is considered. PET scan may be helpful on follow-up in addition to contrasted imaging with CT for further assessment.  Loculated and mildly nodular appearing pleural fluid in the RIGHT chest with increase and with decreased pleural fluid in the LEFT chest compared to prior imaging.  -IR consulted for thoracentesis.  There was no fluid pocket identified that was safe to access by ultrasound thoracentesis. Stable on 2 L oxygen.  Needs follow up CT chest out patient.    4-ESRD on hemodialysis -S/p renal transplant  1985 -Continue with hemodialysis -Nephrology  following.   Barrett esophagus: Continue with PPI  Anemia:  anemia of chronic disease: IV iron per  nephrology Hemoglobin currently stable  Hyperlipidemia: Continue with statins  Depression: Continue with Zoloft  Hyponatremia; fluid management with HD>   Severe tricuspid regurgitation, mild mitral regurgitation. Management per cardiology.     Disposition will be to CIR versus skilled nursing facility  Estimated body mass index is 21.16 kg/m as calculated from the following:   Height as of this encounter: 5\' 9"  (1.753 m).   Weight as of this encounter: 65 kg.   DVT prophylaxis: eliquis.  Code Status: Full code Family Communication: care discussed with patient and his cousin at the bedside Disposition Plan:  Status is: Inpatient  Remains inpatient appropriate because: management of critical limb ischemia.      Consultants:  Nephrology Cardiology Vascular.  Pulmonologist   Procedures:  Right AKA 2/1  Antimicrobials:    Subjective: Feels that pain in right AKA stump is reasonably controlled today.  He is confused during the details regarding CIR versus short-term SNF.  He is concerned about finances moving forward.  He is having a difficult time coping with all the recent changes that have occurred in his functionality/life  Objective: Vitals:   09/10/21 1200 09/10/21 1220 09/10/21 1259 09/10/21 2104  BP: 125/75 130/80 95/71 112/83  Pulse: (!) 105 (!) 109 (!) 103 (!) 109  Resp: 12 (!) 24 20 17   Temp:  (!) 97.5 F (36.4 C) 97.6 F (36.4 C) 97.6 F (36.4 C)  TempSrc:  Temporal Oral Oral  SpO2:  97% 100% 99%  Weight:  65 kg    Height:        Intake/Output Summary (Last 24 hours) at 09/10/2021 2201 Last data filed at 09/10/2021 2105 Gross per 24 hour  Intake 0 ml  Output 2000 ml  Net -2000 ml   Filed Weights   09/09/21 1258 09/10/21 0833 09/10/21 1220  Weight: 70.3 kg 67.4 kg 65 kg    Examination:  General exam: Chronic ill appearing Respiratory system: Clear bilaterally Cardiovascular system: S 1, S 2 RRR Systolic murmur Gastrointestinal system:  BS present, soft, nt Central nervous system: Alert follows command Extremities: R AKA , left BKA     Data Reviewed: I have personally reviewed following labs and imaging studies  CBC: Recent Labs  Lab 09/06/21 0405 09/07/21 0808 09/08/21 0230 09/09/21 0124 09/10/21 0334  WBC 7.7 6.5 5.0 7.1 8.7  HGB 8.2* 8.4* 8.7* 9.7* 9.4*  HCT 27.3* 27.6* 28.3* 31.8* 30.3*  MCV 98.9 99.6 99.6 100.0 99.0  PLT 192 194 187 240 481   Basic Metabolic Panel: Recent Labs  Lab 09/07/21 1430 09/10/21 0334  NA 130* 132*  K 4.7 5.6*  CL 91* 97*  CO2 25 23  GLUCOSE 88 122*  BUN 35* 44*  CREATININE 5.33* 6.10*  CALCIUM 7.8* 7.3*  PHOS 4.4 6.4*   GFR: Estimated Creatinine Clearance: 11 mL/min (A) (by C-G formula based on SCr of 6.1 mg/dL (H)). Liver Function Tests: Recent Labs  Lab 09/07/21 1430 09/10/21 0334  ALBUMIN 1.9* 1.8*   No results for input(s): LIPASE, AMYLASE in the last 168 hours. No results for input(s): AMMONIA in the last 168 hours. Coagulation Profile: No results for input(s): INR, PROTIME in the last 168 hours. Cardiac Enzymes: No results for input(s): CKTOTAL, CKMB, CKMBINDEX, TROPONINI in the last 168 hours. BNP (last 3 results) No results for input(s): PROBNP in the last 8760  hours. HbA1C: No results for input(s): HGBA1C in the last 72 hours. CBG: No results for input(s): GLUCAP in the last 168 hours.  Lipid Profile: No results for input(s): CHOL, HDL, LDLCALC, TRIG, CHOLHDL, LDLDIRECT in the last 72 hours.  Thyroid Function Tests: No results for input(s): TSH, T4TOTAL, FREET4, T3FREE, THYROIDAB in the last 72 hours. Anemia Panel: No results for input(s): VITAMINB12, FOLATE, FERRITIN, TIBC, IRON, RETICCTPCT in the last 72 hours. Sepsis Labs: No results for input(s): PROCALCITON, LATICACIDVEN in the last 168 hours.  No results found for this or any previous visit (from the past 240 hour(s)).       Radiology Studies: No results  found.      Scheduled Meds:  apixaban  5 mg Oral BID   atorvastatin  10 mg Oral QHS   Chlorhexidine Gluconate Cloth  6 each Topical Q0600   cinacalcet  60 mg Oral Q supper   [START ON 09/18/2021] Darbepoetin Alfa  100 mcg Intravenous Q Fri-HD   diltiazem  120 mg Oral Daily   docusate sodium  100 mg Oral TID   gabapentin  100 mg Oral BID   hydrocortisone  25 mg Rectal BID   metoprolol tartrate  37.5 mg Oral BID   midodrine  10 mg Oral TID WC   pantoprazole  40 mg Oral Daily   polyethylene glycol  17 g Oral Daily   senna  2 tablet Oral QHS   sertraline  100 mg Oral Daily   sevelamer carbonate  2,400 mg Oral TID WC   Continuous Infusions:  sodium chloride 250 mL (09/07/21 1041)   ferric gluconate (FERRLECIT) IVPB 125 mg (09/10/21 1002)     LOS: 11 days    Time spent: 35 Minutes.     Kathie Dike, MD [facility]   If 7PM-7AM, please contact night-coverage [email or web address]  09/10/2021, 10:01 PM

## 2021-09-10 NOTE — Progress Notes (Signed)
Scott Castro Progress Note   66 y.o. male PMH COPD Barrett's, hyperparathyroidism, ESRD, failed renal transplant, PAF on Eliquis p/w ischemic foot ulcer -> BKA on 12/30.  Patient receives his dialysis treatments at [place] with [facility].   Dialysis at [facility] with [facility] EDW 68.4kg? EDW before the amputation was 72kg     Assessment/ Plan:   ESRD - TTS at [facility] w/ [facility] now MWF - establishing new EDW  -No further postdialysis bleeding -  Uses elisio dialyzer at [place] but has been tolerating our standard optiflux dialyzer here -Off schedule related to surgery.  Plan for dialysis today then Saturday and then return to Monday Wednesday Friday schedule next week.  [name]- phos at goal on renvela. Ca corrects to normal.  Anemia - on ESA and s/p iron load.  Required transfusion this hosp.  ^ ESA dose.  Hemoglobin stable around 9.5  PAD with critical limb ischemia s/p rt BKA for gangrene - now will require LBKA -  done 1/25.  Now status post right AKA on 09/09/2021  PAF - rate controlled and  Eliquis typically. On dilt and metop - cont even prior to dialysis.  Was having some RVR periodically during this admission and cardiology has been consulted - titrating BB and watching BP.  He's on midodrine now as well.  GERD COPD   9. Dispo: private transport to [place] [facility] unit from his [place] home too costly, will switch from [name] to [facility]- MWF-  has spot.   Possibly going back to CIR or SNF    Subjective:    Patient had AKA went well yesterday.  No issues.  Patient feels well without complaints  BP 110/75 (BP Location: Right Arm)    Pulse (!) 102    Temp 97.7 F (36.5 C) (Oral)    Resp 19    Ht 5\' 9"  (1.753 m)    Wt 70.3 kg    SpO2 98%    BMI 22.89 kg/m   Intake/Output Summary (Last 24 hours) at 09/10/2021 2426 Last data filed at 09/09/2021 1700 Gross per 24 hour  Intake 62.33 ml  Output 100 ml  Net -37.67 ml   Weight change:    Physical Exam: GEN: Lying in bed, no distress HEENT: MMM, no nasal discharge LUNGS: Bilateral chest rise with no increased work of breathing CV: Tachycardia, systolic murmur present ABD: SNDNT +BS  EXT:R BKA covered in gauze, L new BKA-  wrapped  ACCESS: Positive bruit  Imaging: No results found.  Labs: BMET Recent Labs  Lab 09/07/21 1430 09/10/21 0334  NA 130* 132*  K 4.7 5.6*  CL 91* 97*  CO2 25 23  GLUCOSE 88 122*  BUN 35* 44*  CREATININE 5.33* 6.10*  CALCIUM 7.8* 7.3*  PHOS 4.4 6.4*   CBC Recent Labs  Lab 09/07/21 0808 09/08/21 0230 09/09/21 0124 09/10/21 0334  WBC 6.5 5.0 7.1 8.7  HGB 8.4* 8.7* 9.7* 9.4*  HCT 27.6* 28.3* 31.8* 30.3*  MCV 99.6 99.6 100.0 99.0  PLT 194 187 240 248    Medications:     atorvastatin  10 mg Oral QHS   Chlorhexidine Gluconate Cloth  6 each Topical Q0600   cinacalcet  60 mg Oral Q supper   Darbepoetin Alfa  100 mcg Intravenous Q Thu-HD   diltiazem  120 mg Oral Daily   docusate sodium  100 mg Oral TID   gabapentin  100 mg Oral BID   hydrocortisone  25  mg Rectal BID   metoprolol tartrate  37.5 mg Oral BID   midodrine  10 mg Oral TID WC   pantoprazole  40 mg Oral Daily   polyethylene glycol  17 g Oral Daily   senna  2 tablet Oral QHS   sertraline  100 mg Oral Daily   sevelamer carbonate  2.4 g Oral TID WC    Scott Castro  [facility]

## 2021-09-10 NOTE — Progress Notes (Signed)
OT Cancellation Note  Patient Details Name: GAINES CARTMELL MRN: [id number] DOB: 1956-07-30   Cancelled Treatment:    Reason Eval/Treat Not Completed: Patient at procedure or test/ unavailable (HD. Will return as schedule allows.)  [place], OTR/L Acute Rehab Pager: [phone] Office: [phone] 09/10/2021, 8:41 AM

## 2021-09-10 NOTE — Progress Notes (Signed)
Physical Therapy Treatment Patient Details Name: Scott Castro MRN: [id number] DOB: 12-16-1955 Today's Date: 09/10/2021   History of Present Illness 66 yo male presenting on 1/20 from [facility] with bilateral diffuse infrapopliteal disease of the left lower extremity. S/p L BKA 09/02/21. Recent hospitalization at [facility] (08/05/21-08/12/21) for R BKA on 12/30 with d/c to [facility]. s/p right AKA 09/09/21. PMH including  ESRD on dialysis TTS, hypertension, hyperlipidemia, PAF, depression, and chronic respiratory failure 2 L of oxygen.    PT Comments    Patient now s/p right AKA on 09/09/21. Session limited to there ex in bed due to pain and pt declining any bed mobility or OOB mobility this morning. Limited movement of right residual limb requiring assist for all directions. Better able to move left residual limb but limited in knee flexion AROM and lacking full knee extension. Education on proper positioning and to refrain from pillow under left knee to help with gaining full range. Instructed pt in there ex and core exercises to perform. Will follow acutely to attempt progressing mobility and OOB as pt allows, "in a few days."    Recommendations for follow up therapy are one component of a multi-disciplinary discharge planning process, led by the attending physician.  Recommendations may be updated based on patient status, additional functional criteria and insurance authorization.  Follow Up Recommendations  Acute inpatient rehab (3hours/day)     Assistance Recommended at Discharge Frequent or constant Supervision/Assistance  Patient can return home with the following Assistance with cooking/housework;Assist for transportation;A lot of help with walking and/or transfers;A lot of help with bathing/dressing/bathroom;Help with stairs or ramp for entrance   Equipment Recommendations  Wheelchair (measurements PT);Wheelchair cushion (measurements PT);Other (comment)    Recommendations for Other Services        Precautions / Restrictions Precautions Precautions: Fall;Other (comment) Precaution Comments: wound vac Restrictions Weight Bearing Restrictions: Yes RLE Weight Bearing: Non weight bearing LLE Weight Bearing: Non weight bearing Other Position/Activity Restrictions: right AKA, left BKA     Mobility  Bed Mobility               General bed mobility comments: Attempted to roll onto left side to assist with there ex in sidelying after reaching with UEs but then declined due to pain.    Transfers                   General transfer comment: Refused OOB mobility    Ambulation/Gait                   Stairs             Wheelchair Mobility    Modified Rankin (Stroke Patients Only)       Balance                                            Cognition Arousal/Alertness: Awake/alert Behavior During Therapy: Flat affect Overall Cognitive Status: Within Functional Limits for tasks assessed                                 General Comments: immediately not interested in any mobility upon PT arrival despite having no pain at rest; "I just had surgery yesterday, give me a few days, you are moving too fast."  Exercises Amputee Exercises Quad Sets: AROM, Both, 10 reps, Supine Hip Extension: Right, 5 reps, Supine Hip ABduction/ADduction: AAROM, Right, Supine, 5 reps Hip Flexion/Marching: AAROM, Right, 5 reps, Supine Knee Flexion: AAROM, Left, 5 reps, Supine Knee Extension: AROM, Left, 5 reps, Supine Straight Leg Raises: AROM, Left, 5 reps, Supine Other Exercises Other Exercises: using bed rails, pt pulling self up to work on core activation x10    General Comments General comments (skin integrity, edema, etc.): VSS on RA.      Pertinent Vitals/Pain Pain Assessment Pain Assessment: Faces Faces Pain Scale: Hurts even more Pain Location: right residual limb with movement Pain Descriptors / Indicators: Sore,  Operative site guarding Pain Intervention(s): Monitored during session, Repositioned, Limited activity within patient's tolerance    Home Living                          Prior Function            PT Goals (current goals can now be found in the care plan section) Progress towards PT goals: Not progressing toward goals - comment (pain and going down to dialysis)    Frequency    Min 3X/week      PT Plan Current plan remains appropriate    Co-evaluation              AM-PAC PT "6 Clicks" Mobility   Outcome Measure  Help needed turning from your back to your side while in a flat bed without using bedrails?: A Little Help needed moving from lying on your back to sitting on the side of a flat bed without using bedrails?: A Little Help needed moving to and from a bed to a chair (including a wheelchair)?: A Lot Help needed standing up from a chair using your arms (e.g., wheelchair or bedside chair)?: Total Help needed to walk in hospital room?: Total Help needed climbing 3-5 steps with a railing? : Total 6 Click Score: 11    End of Session   Activity Tolerance: Patient limited by pain Patient left: in bed;with call bell/phone within reach;Other (comment) (transport present to take pt down to dialysis) Nurse Communication: Mobility status;Patient requests pain meds PT Visit Diagnosis: Muscle weakness (generalized) (M62.81);Other abnormalities of gait and mobility (R26.89);Pain Pain - Right/Left: Right Pain - part of body: Leg     Time: 0801-0820 PT Time Calculation (min) (ACUTE ONLY): 19 min  Charges:    1 Re-eval charge                    Zettie Cooley, DPT Acute Rehabilitation Services Pager [phone] Office [phone]      [place] 09/10/2021, 9:30 AM

## 2021-09-11 LAB — RENAL FUNCTION PANEL
Albumin: 1.9 g/dL — ABNORMAL LOW (ref 3.5–5.0)
Anion gap: 11 (ref 5–15)
BUN: 26 mg/dL — ABNORMAL HIGH (ref 8–23)
CO2: 26 mmol/L (ref 22–32)
Calcium: 7.6 mg/dL — ABNORMAL LOW (ref 8.9–10.3)
Chloride: 97 mmol/L — ABNORMAL LOW (ref 98–111)
Creatinine, Ser: 3.96 mg/dL — ABNORMAL HIGH (ref 0.61–1.24)
GFR, Estimated: 16 mL/min — ABNORMAL LOW (ref >60)
Glucose, Bld: 110 mg/dL — ABNORMAL HIGH (ref 70–99)
Phosphorus: 3.8 mg/dL (ref 2.5–4.6)
Potassium: 4 mmol/L (ref 3.5–5.1)
Sodium: 134 mmol/L — ABNORMAL LOW (ref 135–145)

## 2021-09-11 LAB — CBC
HCT: 32.1 % — ABNORMAL LOW (ref 39.0–52.0)
Hemoglobin: 9.9 g/dL — ABNORMAL LOW (ref 13.0–17.0)
MCH: 30.7 pg (ref 26.0–34.0)
MCHC: 30.8 g/dL (ref 30.0–36.0)
MCV: 99.7 fL (ref 80.0–100.0)
Platelets: 270 10*3/uL (ref 150–400)
RBC: 3.22 MIL/uL — ABNORMAL LOW (ref 4.22–5.81)
RDW: 19.9 % — ABNORMAL HIGH (ref 11.5–15.5)
WBC: 7.4 10*3/uL (ref 4.0–10.5)
nRBC: 0.3 % — ABNORMAL HIGH (ref 0.0–0.2)

## 2021-09-11 NOTE — Progress Notes (Addendum)
Physical Therapy Treatment Patient Details Name: Scott Castro MRN: [id number] DOB: 03-Aug-1956 Today's Date: 09/11/2021   History of Present Illness 66 yo male presenting on 1/20 from [facility] with bilateral diffuse infrapopliteal disease of the left lower extremity. S/p L BKA 09/02/21. Recent hospitalization at [facility] (08/05/21-08/12/21) for R BKA on 12/30 with d/c to [facility]. s/p right AKA 09/09/21. PMH including  ESRD on dialysis TTS, hypertension, hyperlipidemia, PAF, depression, and chronic respiratory failure 2 L of oxygen.    PT Comments    Returned to assist pt back to bed from recliner using anterior transfer. Will continue to focus on bed mobility, balance, transfers, and w/c mobility.    Recommendations for follow up therapy are one component of a multi-disciplinary discharge planning process, led by the attending physician.  Recommendations may be updated based on patient status, additional functional criteria and insurance authorization.  Follow Up Recommendations  Skilled nursing-short term rehab (<3 hours/day)     Assistance Recommended at Discharge Frequent or constant Supervision/Assistance  Patient can return home with the following Two people to help with walking and/or transfers;Help with stairs or ramp for entrance;Assist for transportation   Equipment Recommendations  Wheelchair (measurements PT);Wheelchair cushion (measurements PT)    Recommendations for Other Services       Precautions / Restrictions Precautions Precautions: Fall;Other (comment) Precaution Comments: wound vac Restrictions Weight Bearing Restrictions: Yes RLE Weight Bearing: Non weight bearing LLE Weight Bearing: Non weight bearing Other Position/Activity Restrictions: right AKA, left BKA     Mobility  Bed Mobility Overal bed mobility: Needs Assistance Bed Mobility: Sit to Supine      Sit to supine: Min assist, HOB elevated   General bed mobility comments: Assist to control descent of trunk     Transfers Overall transfer level: Needs assistance Equipment used: None Transfers: Bed to chair/wheelchair/BSC         Anterior-Posterior transfers: +2 physical assistance, Max assist   General transfer comment: Used bed pad under pt to scoot anteiorly from chair to bed. Pt assist by working on trunk control since difficult to go slight uphill back into bed.    Ambulation/Gait                   Stairs             Wheelchair Mobility    Modified Rankin (Stroke Patients Only)       Balance Overall balance assessment: Needs assistance Sitting-balance support: No upper extremity supported (residual limbs supported) Sitting balance-Leahy Scale: Poor Sitting balance - Comments: mod-max assist statically without support sitting Postural control: Posterior lean                                  Cognition Arousal/Alertness: Awake/alert Behavior During Therapy: Flat affect Overall Cognitive Status: Impaired/Different from baseline Area of Impairment: Awareness, Problem solving                           Awareness: Emergent Problem Solving: Slow processing, Requires verbal cues, Difficulty sequencing General Comments: pt with slow processing and decreased problem solving        Exercises      General Comments General comments (skin integrity, edema, etc.): VSS on RA      Pertinent Vitals/Pain Pain Assessment Pain Assessment: Faces Faces Pain Scale: Hurts little more Pain Location: R residual limb Pain Descriptors / Indicators: Sore, Operative  site guarding Pain Intervention(s): Limited activity within patient's tolerance, Monitored during session    Home Living                          Prior Function            PT Goals (current goals can now be found in the care plan section) Acute Rehab PT Goals PT Goal Formulation: With patient Time For Goal Achievement: 09/25/21 Potential to Achieve Goals:  Good Progress towards PT goals: Progressing toward goals    Frequency    Min 3X/week      PT Plan Current plan remains appropriate    Co-evaluation PT/OT/SLP Co-Evaluation/Treatment: Yes Reason for Co-Treatment: For patient/therapist safety PT goals addressed during session: Mobility/safety with mobility;Balance OT goals addressed during session: ADL's and self-care      AM-PAC PT "6 Clicks" Mobility   Outcome Measure  Help needed turning from your back to your side while in a flat bed without using bedrails?: Total Help needed moving from lying on your back to sitting on the side of a flat bed without using bedrails?: Total Help needed moving to and from a bed to a chair (including a wheelchair)?: Total Help needed standing up from a chair using your arms (e.g., wheelchair or bedside chair)?: Total Help needed to walk in hospital room?: Total Help needed climbing 3-5 steps with a railing? : Total 6 Click Score: 6    End of Session   Activity Tolerance: Patient tolerated treatment well Patient left: with call bell/phone within reach;in bed;with bed alarm set Nurse Communication: Mobility status (Nurse assisted with transfer) PT Visit Diagnosis: Muscle weakness (generalized) (M62.81);Other abnormalities of gait and mobility (R26.89);Pain Pain - Right/Left: Right Pain - part of body: Leg     Time: 1207-1218 PT Time Calculation (min) (ACUTE ONLY): 11 min  Charges:  $Therapeutic Activity: 8-22 mins                     [place] Pager [phone] Office [place] 09/11/2021, 2:17 PM

## 2021-09-11 NOTE — Progress Notes (Signed)
Scott Castro Progress Note   66 y.o. male PMH COPD Barrett's, hyperparathyroidism, ESRD, failed renal transplant, PAF on Eliquis p/w ischemic foot ulcer -> BKA on 12/30.  Patient receives his dialysis treatments at [place] with [facility].   Dialysis at [facility] with [facility] EDW 68.4kg? EDW before the amputation was 72kg     Assessment/ Plan:   ESRD - TTS at [facility] w/ [facility] now MWF - establishing new EDW  -  Uses elisio dialyzer at [place] but has been tolerating our standard optiflux dialyzer here -Off schedule related to surgery.  Plan for dialysis Saturday and then return to Monday Wednesday Friday schedule next week.  [name]- phos at goal on renvela. Ca corrects to normal.  Anemia - on ESA and s/p iron load.  Required transfusion this hosp.  ^ ESA dose.  Hemoglobin stable around 9-10  PAD with critical limb ischemia s/p rt BKA for gangrene - now will require LBKA -  done 1/25.  Now status post right AKA on 09/09/2021  PAF - rate controlled and  Eliquis typically. On dilt and metop - cont even prior to dialysis.  Was having some RVR periodically during this admission and cardiology has been consulted - titrating BB and watching BP.  He's on midodrine now as well.  GERD COPD   9. Dispo: private transport to [place] HD unit from his [place] home too costly, will switch from [name] to [facility]- MWF-  has spot.   Possibly going back to CIR or SNF    Subjective:    Patient feels well today with no complaints.  Dialysis went well  BP 107/77    Pulse (!) 109    Temp 97.8 F (36.6 C) (Oral)    Resp 18    Ht 5\' 9"  (1.753 m)    Wt 66.5 kg    SpO2 100%    BMI 21.65 kg/m   Intake/Output Summary (Last 24 hours) at 09/11/2021 1036 Last data filed at 09/11/2021 0867 Gross per 24 hour  Intake 0 ml  Output 2000 ml  Net -2000 ml   Weight change: -2.908 kg  Physical Exam: GEN: Lying in bed, no distress HEENT: MMM, no nasal discharge LUNGS:  Bilateral chest rise with no increased work of breathing CV: Tachycardia, no edema ABD: SNDNT +BS  EXT:R AKA w/ wound vac, L new BKA wraped ACCESS: Positive bruit  Imaging: No results found.  Labs: BMET Recent Labs  Lab 09/07/21 1430 09/10/21 0334 09/11/21 0135  NA 130* 132* 134*  K 4.7 5.6* 4.0  CL 91* 97* 97*  CO2 25 23 26   GLUCOSE 88 122* 110*  BUN 35* 44* 26*  CREATININE 5.33* 6.10* 3.96*  CALCIUM 7.8* 7.3* 7.6*  PHOS 4.4 6.4* 3.8   CBC Recent Labs  Lab 09/08/21 0230 09/09/21 0124 09/10/21 0334 09/11/21 0135  WBC 5.0 7.1 8.7 7.4  HGB 8.7* 9.7* 9.4* 9.9*  HCT 28.3* 31.8* 30.3* 32.1*  MCV 99.6 100.0 99.0 99.7  PLT 187 240 248 270    Medications:     apixaban  5 mg Oral BID   atorvastatin  10 mg Oral QHS   Chlorhexidine Gluconate Cloth  6 each Topical Q0600   cinacalcet  60 mg Oral Q supper   [START ON 09/18/2021] Darbepoetin Alfa  100 mcg Intravenous Q Fri-HD   diltiazem  120 mg Oral Daily   docusate sodium  100 mg Oral TID   gabapentin  100 mg Oral  BID   hydrocortisone  25 mg Rectal BID   metoprolol tartrate  37.5 mg Oral BID   midodrine  10 mg Oral TID WC   pantoprazole  40 mg Oral Daily   polyethylene glycol  17 g Oral Daily   senna  2 tablet Oral QHS   sertraline  100 mg Oral Daily   sevelamer carbonate  2,400 mg Oral TID WC    Scott Castro  [facility]

## 2021-09-11 NOTE — Discharge Instructions (Addendum)
[facility] [place]  Discharge instructions  Lower Extremity Amputation  Please refer to the following instruction for your post-procedure care. Your surgeon or physician assistant will discuss any changes with you.  Activity  You are encouraged to walk as much as you can. You can slowly return to normal activities during the month after your surgery. Avoid strenuous activity and heavy lifting until your doctor tells you it's OK. Avoid activities such as vacuuming or swinging a golf club. Do not drive until your doctor give the OK and you are no longer taking prescription pain medications. It is also normal to have difficulty with sleep habits, eating and bowel movement after surgery. These will go away with time.  Bathing/Showering  Shower daily after you go home. Do not soak in a bathtub, hot tub, or swim until the incision heals completely.  Incision Care  Clean your incision with mild soap and water. Shower every day. Pat the area dry with a clean towel. You do not need a bandage unless otherwise instructed. Do not apply any ointments or creams to your incision. If you have open wounds you will be instructed how to care for them or a visiting nurse may be arranged for you. If you have staples or sutures along your incision they will be removed at your post-op appointment. You may have skin glue on your incision. Do not peel it off. It will come off on its own in about one week.  Diet  Resume your normal diet. There are no special food restrictions following this procedure. A low fat/ low cholesterol diet is recommended for all patients with vascular disease. In order to heal from your surgery, it is CRITICAL to get adequate nutrition. Your body requires vitamins, minerals, and protein. Vegetables are the best source of vitamins and minerals. Vegetables also provide the perfect balance of protein. Processed food has little nutritional value, so try to avoid  this.  Medications  Resume taking all your medications unless your doctor or physician assistant tells you not to. If your incision is causing pain, you may take over-the-counter pain relievers such as acetaminophen (Tylenol). If you were prescribed a stronger pain medication, please aware these medication can cause nausea and constipation. Prevent nausea by taking the medication with a snack or meal. Avoid constipation by drinking plenty of fluids and eating foods with high amount of fiber, such as fruits, vegetables, and grains. Take Colace 100 mg (an over-the-counter stool softener) twice a day as needed for constipation.  Do not take Tylenol if you are taking prescription pain medications.  Follow Up  Our office will schedule a follow up appointment 4 weeks following discharge.  Please call us immediately for any of the following conditions  Increase pain, redness, warmth, or drainage (pus) from your incision site(s) Fever of 101 degree or higher The swelling in your leg with the amputation suddenly worsens and becomes more painful than when you were in the hospital  Leg swelling is common after amputation surgery.  The swelling should improve over a few months following surgery. To improve the swelling, you may elevate your legs above the level of your heart while you are sitting or resting. Your surgeon or physician assistant may ask you to apply an ACE wrap or wear compression (TED) stockings to help to reduce swelling.  Reduce your risk of vascular disease  Stop smoking. If you would like help call [facility] at 1-800-QUIT-NOW (1-800-784-8669) or [place] at 336-586-4000.  Manage   your cholesterol Maintain a desired weight Control your diabetes weight Control your diabetes Keep your blood pressure down  If you have any questions, please call the office at [phone]   Additional Discharge Instructions   Please get your medications reviewed and adjusted by your  Primary MD.  Please request your Primary MD to go over all Hospital Tests and Procedure/Radiological results at the follow up, please get all Hospital records sent to your Prim MD by signing hospital release before you go home.  If you had Pneumonia of Lung problems at the Hospital: Please get a 2 view Chest X ray done in approximately 4 weeks after hospital discharge or sooner if instructed by your Primary MD.  If you have Congestive Heart Failure: Please call your Cardiologist or Primary MD anytime you have any of the following symptoms:  1) 3 pound weight gain in 24 hours or 5 pounds in 1 week  2) shortness of breath, with or without a dry hacking cough  3) swelling in the hands, feet or stomach  4) if you have to sleep on extra pillows at night in order to breathe  Follow cardiac low salt diet and 1.5 lit/day fluid restriction.  If you have diabetes Accuchecks 4 times/day, Once in AM empty stomach and then before each meal. Log in all results and show them to your primary doctor at your next visit. If any glucose reading is under 80 or above 300 call your primary MD immediately.  If you have Seizure/Convulsions/Epilepsy: Please do not drive, operate heavy machinery, participate in activities at heights or participate in high speed sports until you have seen by Primary MD or a Neurologist and advised to do so again. Per [facility] statutes, patients with seizures are not allowed to drive until they have been seizure-free for six months.  Use caution when using heavy equipment or power tools. Avoid working on ladders or at heights. Take showers instead of baths. Ensure the water temperature is not too high on the home water heater. Do not go swimming alone. Do not lock yourself in a room alone (i.e. bathroom). When caring for infants or small children, sit down when holding, feeding, or changing them to minimize risk of injury to the child in the event you have a seizure. Maintain  good sleep hygiene. Avoid alcohol.   If you had Gastrointestinal Bleeding: Please ask your Primary MD to check a complete blood count within one week of discharge or at your next visit. Your endoscopic/colonoscopic biopsies that are pending at the time of discharge, will also need to followed by your Primary MD.  Get Medicines reviewed and adjusted. Please take all your medications with you for your next visit with your Primary MD  Please request your Primary MD to go over all hospital tests and procedure/radiological results at the follow up, please ask your Primary MD to get all Hospital records sent to his/her office.  If you experience worsening of your admission symptoms, develop shortness of breath, life threatening emergency, suicidal or homicidal thoughts you must seek medical attention immediately by calling 911 or calling your MD immediately  if symptoms less severe.  You must read complete instructions/literature along with all the possible adverse reactions/side effects for all the Medicines you take and that have been prescribed to you. Take any new Medicines after you have completely understood and accpet all the possible adverse reactions/side effects.   Do not drive or operate heavy machinery when taking Pain medications.  Do not take more than prescribed Pain, Sleep and Anxiety Medications  Special Instructions: If you have smoked or chewed Tobacco  in the last 2 yrs please stop smoking, stop any regular Alcohol  and or any Recreational drug use.  Wear Seat belts while driving.  Please note You were cared for by a hospitalist during your hospital stay. If you have any questions about your discharge medications or the care you received while you were in the hospital after you are discharged, you can call the unit and asked to speak with the hospitalist on call if the hospitalist that took care of you is not available. Once you are discharged, your primary care physician will  handle any further medical issues. Please note that NO REFILLS for any discharge medications will be authorized once you are discharged, as it is imperative that you return to your primary care physician (or establish a relationship with a primary care physician if you do not have one) for your aftercare needs so that they can reassess your need for medications and monitor your lab values.  You can reach the hospitalist office at phone [phone] or fax [phone]   If you do not have a primary care physician, you can call [phone] for a physician referral.

## 2021-09-11 NOTE — Care Management Important Message (Signed)
Important Message  Patient Details  Name: Scott Castro MRN: [id number] Date of Birth: Jan 19, 1956   Medicare Important Message Given:  Yes     [name] 09/11/2021, 9:02 AM

## 2021-09-11 NOTE — Progress Notes (Addendum)
°  Progress Note    09/11/2021 7:52 AM 2 Days Post-Op  Subjective:  no major complaints. Some worries about CIR vs SNF and his therapy demands   Vitals:   09/11/21 0629 09/11/21 0737  BP: 107/77   Pulse: (!) 109   Resp: 18 18  Temp:  97.8 F (36.6 C)  SpO2: 100%    Physical Exam: Cardiac:  tachy Lungs:  non labored Incisions:  right AKA with Prevena Vac to suction. Left BKA dressings with minimal serosanguious drainage. No active drainage or bleeding when dressings removed. Intact and well appearing. Viable skin edges Neurologic: alert and oriented  CBC    Component Value Date/Time   WBC 7.4 09/11/2021 0135   RBC 3.22 (L) 09/11/2021 0135   HGB 9.9 (L) 09/11/2021 0135   HGB 11.4 (L) 06/11/2012 0501   HCT 32.1 (L) 09/11/2021 0135   HCT 34.6 (L) 06/11/2012 0501   PLT 270 09/11/2021 0135   PLT 251 06/11/2012 0501   MCV 99.7 09/11/2021 0135   MCV 95 06/11/2012 0501   MCH 30.7 09/11/2021 0135   MCHC 30.8 09/11/2021 0135   RDW 19.9 (H) 09/11/2021 0135   RDW 14.4 06/11/2012 0501   LYMPHSABS 0.8 08/26/2021 1004   LYMPHSABS 1.2 06/11/2012 0501   MONOABS 0.9 08/26/2021 1004   MONOABS 0.7 06/11/2012 0501   EOSABS 0.0 08/26/2021 1004   EOSABS 0.2 06/11/2012 0501   BASOSABS 0.0 08/26/2021 1004   BASOSABS 0.1 06/11/2012 0501    BMET    Component Value Date/Time   NA 134 (L) 09/11/2021 0135   NA 142 06/10/2012 0924   K 4.0 09/11/2021 0135   K 4.5 06/16/2013 1315   CL 97 (L) 09/11/2021 0135   CL 102 06/10/2012 0924   CO2 26 09/11/2021 0135   CO2 29 06/10/2012 0924   GLUCOSE 110 (H) 09/11/2021 0135   GLUCOSE 83 06/10/2012 0924   BUN 26 (H) 09/11/2021 0135   BUN 35 (H) 06/10/2012 0924   CREATININE 3.96 (H) 09/11/2021 0135   CREATININE 10.29 (H) 06/10/2012 0924   CALCIUM 7.6 (L) 09/11/2021 0135   CALCIUM 9.2 06/10/2012 0924   GFRNONAA 16 (L) 09/11/2021 0135   GFRNONAA 5 (L) 06/10/2012 0924   GFRAA 9 (L) 09/18/2019 0430   GFRAA 6 (L) 06/10/2012 0924    INR     Component Value Date/Time   INR 1.7 (H) 08/05/2021 1846     Intake/Output Summary (Last 24 hours) at 09/11/2021 0752 Last data filed at 09/11/2021 0420 Gross per 24 hour  Intake 0 ml  Output 2000 ml  Net -2000 ml     Assessment/Plan:  66 y.o. male is s/p revision of right AKA 2 Days Post-Op . Left BKA 9 days post op  Prevena incisional VAC to right AKA inplace. Minimal 20 cc serosanguinous output. VAC will remain on 7 days or until d/c Left BKA well appearing. Very minimal serosanguinous drainage on dressings. Dressings reapplied. Hopefully can transition to stump sock tomorrow. Will have staple removal in 4 weeks Eliquis restarted yesterday Continue Therapies Dispo CIR vs SNF. Okay from vascular standpoint whenever ready   Karoline Caldwell, PA-C [facility] [phone] 09/11/2021 7:52 AM  VASCULAR STAFF ADDENDUM: I have independently interviewed and examined the patient. I agree with the above.   [name], MD [facility] Phone Number: [phone] 09/11/2021 10:50 AM

## 2021-09-11 NOTE — Progress Notes (Signed)
[facility]) Hospital Liaison note:  This is a pending outpatient-based Palliative Care patient. Will continue to follow for disposition.  Please call with any outpatient palliative questions or concerns.  Thank you, Lorelee Market, LPN [facility] Liaison [phone]

## 2021-09-11 NOTE — Progress Notes (Signed)
Occupational Therapy RE-eval  Patient Details Name: Scott Castro MRN: [id number] DOB: 1955-09-14 Today's Date: 09/11/2021   History of present illness 66 yo male presenting on 1/20 from [facility] with bilateral diffuse infrapopliteal disease of the left lower extremity. S/p L BKA 09/02/21. Recent hospitalization at [facility] (08/05/21-08/12/21) for R BKA on 12/30 with d/c to [facility]. s/p right AKA 09/09/21. PMH including  ESRD on dialysis TTS, hypertension, hyperlipidemia, PAF, depression, and chronic respiratory failure 2 L of oxygen.   OT comments  Pt seen for re-eval post R AKA revision.  Patient completed a/p transfer into recliner with max assist +2, demonstrate poor balance, pain, weakness, and decreased activity tolerance.  Min assist for forward lean at sink to engage in grooming tasks, decreased static sitting balance when unsupported in sitting. Pt requiring increased time for problem solving and demonstrating slow processing. Updated dc plan to SNF and updated goals at this time.  Will follow acutely.    Recommendations for follow up therapy are one component of a multi-disciplinary discharge planning process, led by the attending physician.  Recommendations may be updated based on patient status, additional functional criteria and insurance authorization.    Follow Up Recommendations  Skilled nursing-short term rehab (<3 hours/day)    Assistance Recommended at Discharge Frequent or constant Supervision/Assistance  Patient can return home with the following  Assist for transportation;Help with stairs or ramp for entrance;Assistance with cooking/housework;Two people to help with walking and/or transfers;A lot of help with bathing/dressing/bathroom   Equipment Recommendations  BSC/3in1;Wheelchair (measurements OT);Wheelchair cushion (measurements OT);Tub/shower bench    Recommendations for Other Services      Precautions / Restrictions Precautions Precautions: Fall;Other (comment) Precaution Comments:  wound vac Restrictions Weight Bearing Restrictions: Yes RLE Weight Bearing: Non weight bearing LLE Weight Bearing: Non weight bearing Other Position/Activity Restrictions: right AKA, left BKA       Mobility Bed Mobility Overal bed mobility: Needs Assistance Bed Mobility: Supine to Sit     Supine to sit: Mod assist, +2 for safety/equipment     General bed mobility comments: supine to long sitting with heavy reliance on rails/pulling on therapist.    Transfers                         Balance Overall balance assessment: Needs assistance Sitting-balance support: No upper extremity supported, Feet supported Sitting balance-Leahy Scale: Poor Sitting balance - Comments: mod-max assist statically without support sitting Postural control: Posterior lean                                 ADL either performed or assessed with clinical judgement   ADL Overall ADL's : Needs assistance/impaired     Grooming: Minimal assistance;Sitting Grooming Details (indicate cue type and reason): at sink for foward lean min assist to reach items needed Upper Body Bathing: Minimal assistance;Sitting Upper Body Bathing Details (indicate cue type and reason): sitting supported Lower Body Bathing: Total assistance;Bed level   Upper Body Dressing : Minimal assistance;Sitting   Lower Body Dressing: Total assistance;Bed level;+2 for physical assistance;+2 for safety/equipment   Toilet Transfer: Maximal assistance;+2 for physical assistance;+2 for safety/equipment;Anterior/posterior Armed forces technical officer Details (indicate cue type and reason): simulated into recliner         Functional mobility during ADLs: Maximal assistance;+2 for physical assistance;+2 for safety/equipment      Extremity/Trunk Assessment Upper Extremity Assessment Upper Extremity Assessment: Generalized weakness   Lower  Extremity Assessment Lower Extremity Assessment:  (s/p R AKA, L BKA)        Vision        Perception     Praxis      Cognition Arousal/Alertness: Awake/alert Behavior During Therapy: Flat affect Overall Cognitive Status: Impaired/Different from baseline Area of Impairment: Awareness, Problem solving                           Awareness: Emergent Problem Solving: Slow processing, Requires verbal cues, Difficulty sequencing General Comments: pt with slow processing and decreased problem solving        Exercises      Shoulder Instructions       General Comments VSS on RA    Pertinent Vitals/ Pain       Pain Assessment Pain Assessment: Faces Faces Pain Scale: Hurts little more Pain Location: R residual limb Pain Descriptors / Indicators: Sore, Operative site guarding Pain Intervention(s): Limited activity within patient's tolerance, Monitored during session, Repositioned  Home Living                                          Prior Functioning/Environment              Frequency  Min 2X/week        Progress Toward Goals  OT Goals(current goals can now be found in the care plan section)  Progress towards OT goals: Progressing toward goals  Acute Rehab OT Goals Patient Stated Goal: get better OT Goal Formulation: With patient Time For Goal Achievement: 09/22/21 Potential to Achieve Goals: Good  Plan Discharge plan needs to be updated;Frequency needs to be updated    Co-evaluation    PT/OT/SLP Co-Evaluation/Treatment: Yes Reason for Co-Treatment: For patient/therapist safety;To address functional/ADL transfers   OT goals addressed during session: ADL's and self-care      AM-PAC OT "6 Clicks" Daily Activity     Outcome Measure   Help from another person eating meals?: A Little Help from another person taking care of personal grooming?: A Little Help from another person toileting, which includes using toliet, bedpan, or urinal?: Total Help from another person bathing (including washing, rinsing,  drying)?: A Lot Help from another person to put on and taking off regular upper body clothing?: A Little Help from another person to put on and taking off regular lower body clothing?: Total 6 Click Score: 13    End of Session    OT Visit Diagnosis: Other abnormalities of gait and mobility (R26.89);Muscle weakness (generalized) (M62.81);Pain Pain - Right/Left: Right Pain - part of body: Leg   Activity Tolerance No increased pain;Patient tolerated treatment well   Patient Left in chair;with family/visitor present   Nurse Communication Mobility status        Time: 7654-6503 OT Time Calculation (min): 29 min  Charges: OT General Charges $OT Visit: 1 Visit OT Evaluation $OT Re-eval: 1 Re-eval  Jolaine Artist, OT Acute Rehabilitation Services Pager [phone] Office [phone]   Delight Stare 09/11/2021, 12:06 PM

## 2021-09-11 NOTE — Progress Notes (Signed)
Physical Therapy Treatment Patient Details Name: Scott Castro MRN: [id number] DOB: 05-05-1956 Today's Date: 09/11/2021   History of Present Illness 66 yo male presenting on 1/20 from [facility] with bilateral diffuse infrapopliteal disease of the left lower extremity. S/p L BKA 09/02/21. Recent hospitalization at [facility] (08/05/21-08/12/21) for R BKA on 12/30 with d/c to [facility]. s/p right AKA 09/09/21. PMH including  ESRD on dialysis TTS, hypertension, hyperlipidemia, PAF, depression, and chronic respiratory failure 2 L of oxygen.    PT Comments    Pt's mobility reassessed after revision of RLE up to an AKA. Pt still with expected pain and soreness of AKA. Worked to progress to OOB and able to accomplish with posterior transfer to recliner. Expect pt will need more extended time than offered on inpatient rehab to reach a level to return home alone at w/c level. Pt agreeable to ST-SNF for rehab.    Recommendations for follow up therapy are one component of a multi-disciplinary discharge planning process, led by the attending physician.  Recommendations may be updated based on patient status, additional functional criteria and insurance authorization.  Follow Up Recommendations  Skilled nursing-short term rehab (<3 hours/day)     Assistance Recommended at Discharge Frequent or constant Supervision/Assistance  Patient can return home with the following Two people to help with walking and/or transfers;Help with stairs or ramp for entrance;Assist for transportation   Equipment Recommendations  Wheelchair (measurements PT);Wheelchair cushion (measurements PT)    Recommendations for Other Services       Precautions / Restrictions Precautions Precautions: Fall;Other (comment) Precaution Comments: wound vac Restrictions Weight Bearing Restrictions: Yes RLE Weight Bearing: Non weight bearing LLE Weight Bearing: Non weight bearing Other Position/Activity Restrictions: right AKA, left BKA     Mobility  Bed  Mobility Overal bed mobility: Needs Assistance Bed Mobility: Supine to Sit     Supine to sit: Mod assist, +2 for safety/equipment     General bed mobility comments: Assist to elevate trunk into long sitting with pt pulling up on rails.    Transfers Overall transfer level: Needs assistance Equipment used: None Transfers: Bed to chair/wheelchair/BSC         Anterior-Posterior transfers: +2 physical assistance, Max assist   General transfer comment: Assist using bed pad under pt to scoot posteriorly from bed to recliner. Pt able to assist some with UE's    Ambulation/Gait                   Stairs             Wheelchair Mobility    Modified Rankin (Stroke Patients Only)       Balance Overall balance assessment: Needs assistance Sitting-balance support: No upper extremity supported (residual limbs supported) Sitting balance-Leahy Scale: Poor Sitting balance - Comments: mod-max assist statically without support sitting Postural control: Posterior lean                                  Cognition Arousal/Alertness: Awake/alert Behavior During Therapy: Flat affect Overall Cognitive Status: Impaired/Different from baseline Area of Impairment: Awareness, Problem solving                           Awareness: Emergent Problem Solving: Slow processing, Requires verbal cues, Difficulty sequencing General Comments: pt with slow processing and decreased problem solving        Exercises  General Comments General comments (skin integrity, edema, etc.): VSS on RA      Pertinent Vitals/Pain Pain Assessment Pain Assessment: Faces Faces Pain Scale: Hurts little more Pain Location: R residual limb Pain Descriptors / Indicators: Sore, Operative site guarding Pain Intervention(s): Limited activity within patient's tolerance, Monitored during session    Home Living                          Prior Function             PT Goals (current goals can now be found in the care plan section) Acute Rehab PT Goals PT Goal Formulation: With patient Time For Goal Achievement: 09/25/21 Potential to Achieve Goals: Good Progress towards PT goals: Not progressing toward goals - comment (further surgery; s/p rt AKA)    Frequency    Min 3X/week      PT Plan Discharge plan needs to be updated    Co-evaluation PT/OT/SLP Co-Evaluation/Treatment: Yes Reason for Co-Treatment: For patient/therapist safety PT goals addressed during session: Mobility/safety with mobility;Balance OT goals addressed during session: ADL's and self-care      AM-PAC PT "6 Clicks" Mobility   Outcome Measure  Help needed turning from your back to your side while in a flat bed without using bedrails?: Total Help needed moving from lying on your back to sitting on the side of a flat bed without using bedrails?: Total Help needed moving to and from a bed to a chair (including a wheelchair)?: Total Help needed standing up from a chair using your arms (e.g., wheelchair or bedside chair)?: Total Help needed to walk in hospital room?: Total Help needed climbing 3-5 steps with a railing? : Total 6 Click Score: 6    End of Session   Activity Tolerance: Patient tolerated treatment well Patient left: in chair;with call bell/phone within reach;with family/visitor present Nurse Communication: Mobility status PT Visit Diagnosis: Muscle weakness (generalized) (M62.81);Other abnormalities of gait and mobility (R26.89);Pain Pain - Right/Left: Right Pain - part of body: Leg     Time: 7096-2836 PT Time Calculation (min) (ACUTE ONLY): 30 min  Charges:  $Therapeutic Activity: 8-22 mins                     [place] Pager [phone] Office [place] 09/11/2021, 2:09 PM

## 2021-09-11 NOTE — Progress Notes (Signed)
PROGRESS NOTE    Scott Castro  HYW:[id number] DOB: 03-Nov-1955 DOA: 08/30/2021 PCP: Franciso Bend, MD   Brief Narrative: 66 year old past medical history significant for ESRD on dialysis TTS, hypertension, hyperlipidemia, PAF, depression, chronic respiratory failure 2 L of oxygen, PAD recent right BKA on 08/07/2021 secondary to critical limb ischemia discharged to rehab.  He was seen by cardiology in inpatient rehab due to A. fib RVR and he was referred for inpatient telemetry admission.  Recent hospitalized at [facility] from 12/28 until 1//2023 right lower extremity critical limb ischemia status post BKA by vascular on 12/30.  Was just discharged to inpatient rehab here at [facility].  Found to be in RVR RVR admitted for further evaluation.  Patient  also had angiography 1/20 by Dr. [name] with BL diffuse infrapopliteal disease of the left lower extremity, vascular is recommended  left BKA. Patient underwent Left BKA 1/25.  Patient noted to be more hypoxic on 1/23, chest x-ray showed bilateral loculated pleural effusion, pulmonologist consulted for further evaluation. CT chest showed right loculated pleural effusion on right. Right lower lobe with irregular consolidation right lower lobe, he will need PET scan out patient. His oxygen requirement has remain stable. No safe window for thoracentesis. He will need CT chest out patient follow up   He develops anterior flap necrosis of right BKA> vascular planning AKA on 2/1. \ Plan is for CIR versus SNF when stable.     Assessment & Plan:   Principal Problem:   Critical limb ischemia of left lower extremity/PAD  Active Problems:   COPD (chronic obstructive pulmonary disease) (HCC)   Chronic respiratory failure with hypoxia (HCC)   ESRD (end stage renal disease) (HCC)   Barrett esophagus   Anemia   Mixed hyperlipidemia   S/P BKA (below knee amputation), right (HCC)   Depressive reaction   AF (paroxysmal atrial fibrillation) (HCC)  1-Critical  Limb ischemia of left lower extremity/PAD -Angiography 1/20 by Dr. [name] revealed diffuse infrapopliteal disease of the left with no revascularization options. -Recent BKA on the right 12/22 -Underwent  left BKA 1/25th -Tolerated procedure well.  -Dressing  removed 1/27. Unfortunately developed necrotic flap on right BKA and was transitioned to right AKA on 2/1 -Further postop care per vascular surgery  2-A. Fib Continue with  metoprolol and Cardizem.  Appreciate cardiology assistance.  On anticoagulation with Eliquis HR ranging between 100-115 Per cardiology notes, this is a reasonable range. Unless HR begins to sustain higher or patient becomes symptomatic, no further adjustment of meds at this time  3-Acute on chronic hypoxic respiratory failure Loculated bilateral moderate pleural effusion Moderate Pulmonary HTN;  COPD -Pulmonologist consulted. -Incentive Spirometry ordered. -Continue with nebulizer as needed -CT chest 1/23: Patchy nodularity and ground-glass in the dependent RIGHT upper lobe and bilateral lower lobes raising the question of pneumonitis with distribution raising the question of sequela of aspiration. More dense consolidative changes and irregular bronchial structures in the RIGHT lower lobe central to an area of rounded atelectasis is of uncertain significance. Given the more mass like appearance in this area and increasing size of mediastinal lymph nodes over time a possibility of neoplasm is considered. PET scan may be helpful on follow-up in addition to contrasted imaging with CT for further assessment.  Loculated and mildly nodular appearing pleural fluid in the RIGHT chest with increase and with decreased pleural fluid in the LEFT chest compared to prior imaging.  -IR consulted for thoracentesis.  There was no fluid pocket identified  that was safe to access by ultrasound thoracentesis. Stable on 2 L oxygen.  Needs follow up CT chest out patient.    4-ESRD on  hemodialysis -S/p renal transplant [place] with hemodialysis -Nephrology  following.   Barrett esophagus: Continue with PPI  Anemia:  anemia of chronic disease: IV iron per nephrology Hemoglobin currently stable  Hyperlipidemia: Continue with statins  Depression: Continue with Zoloft  Hyponatremia; fluid management with HD>   Severe tricuspid regurgitation, mild mitral regurgitation. Management per cardiology.     Disposition will be to CIR versus skilled nursing facility  Estimated body mass index is 21.65 kg/m as calculated from the following:   Height as of this encounter: 5\' 9"  (1.753 m).   Weight as of this encounter: 66.5 kg.   DVT prophylaxis: eliquis.  Code Status: Full code Family Communication: care discussed with patient. His friend was at bedside, but patient gave permission to discuss his care Disposition Plan:  Status is: Inpatient  Remains inpatient appropriate because: management of critical limb ischemia.      Consultants:  Nephrology Cardiology Vascular.  Pulmonologist   Procedures:  Left BKA 1/25 Right AKA 2/1  Antimicrobials:    Subjective: Denies any shortness of breath or chest pain  Objective: Vitals:   09/11/21 0629 09/11/21 0737 09/11/21 1000 09/11/21 1143  BP: 107/77  100/70   Pulse: (!) 109  (!) 112   Resp: 18 18 19 18   Temp:  97.8 F (36.6 C)    TempSrc:  Oral    SpO2: 100%  98%   Weight:      Height:        Intake/Output Summary (Last 24 hours) at 09/11/2021 1409 Last data filed at 09/11/2021 0420 Gross per 24 hour  Intake 0 ml  Output 0 ml  Net 0 ml   Filed Weights   09/10/21 0833 09/10/21 1220 09/11/21 0500  Weight: 67.4 kg 65 kg 66.5 kg    Examination:  General exam: Chronic ill appearing Respiratory system: Clear bilaterally Cardiovascular system: S 1, S 2 irregular Systolic murmur Gastrointestinal system: BS present, soft, nt Central nervous system: Alert follows command Extremities: R AKA ,  left BKA     Data Reviewed: I have personally reviewed following labs and imaging studies  CBC: Recent Labs  Lab 09/07/21 0808 09/08/21 0230 09/09/21 0124 09/10/21 0334 09/11/21 0135  WBC 6.5 5.0 7.1 8.7 7.4  HGB 8.4* 8.7* 9.7* 9.4* 9.9*  HCT 27.6* 28.3* 31.8* 30.3* 32.1*  MCV 99.6 99.6 100.0 99.0 99.7  PLT 194 187 240 248 976   Basic Metabolic Panel: Recent Labs  Lab 09/07/21 1430 09/10/21 0334 09/11/21 0135  NA 130* 132* 134*  K 4.7 5.6* 4.0  CL 91* 97* 97*  CO2 25 23 26   GLUCOSE 88 122* 110*  BUN 35* 44* 26*  CREATININE 5.33* 6.10* 3.96*  CALCIUM 7.8* 7.3* 7.6*  PHOS 4.4 6.4* 3.8   GFR: Estimated Creatinine Clearance: 17.3 mL/min (A) (by C-G formula based on SCr of 3.96 mg/dL (H)). Liver Function Tests: Recent Labs  Lab 09/07/21 1430 09/10/21 0334 09/11/21 0135  ALBUMIN 1.9* 1.8* 1.9*   No results for input(s): LIPASE, AMYLASE in the last 168 hours. No results for input(s): AMMONIA in the last 168 hours. Coagulation Profile: No results for input(s): INR, PROTIME in the last 168 hours. Cardiac Enzymes: No results for input(s): CKTOTAL, CKMB, CKMBINDEX, TROPONINI in the last 168 hours. BNP (last 3 results) No results for input(s): PROBNP in the last  8760 hours. HbA1C: No results for input(s): HGBA1C in the last 72 hours. CBG: No results for input(s): GLUCAP in the last 168 hours.  Lipid Profile: No results for input(s): CHOL, HDL, LDLCALC, TRIG, CHOLHDL, LDLDIRECT in the last 72 hours.  Thyroid Function Tests: No results for input(s): TSH, T4TOTAL, FREET4, T3FREE, THYROIDAB in the last 72 hours. Anemia Panel: No results for input(s): VITAMINB12, FOLATE, FERRITIN, TIBC, IRON, RETICCTPCT in the last 72 hours. Sepsis Labs: No results for input(s): PROCALCITON, LATICACIDVEN in the last 168 hours.  No results found for this or any previous visit (from the past 240 hour(s)).       Radiology Studies: No results found.      Scheduled Meds:   apixaban  5 mg Oral BID   atorvastatin  10 mg Oral QHS   Chlorhexidine Gluconate Cloth  6 each Topical Q0600   cinacalcet  60 mg Oral Q supper   [START ON 09/18/2021] Darbepoetin Alfa  100 mcg Intravenous Q Fri-HD   diltiazem  120 mg Oral Daily   docusate sodium  100 mg Oral TID   gabapentin  100 mg Oral BID   hydrocortisone  25 mg Rectal BID   metoprolol tartrate  37.5 mg Oral BID   midodrine  10 mg Oral TID WC   pantoprazole  40 mg Oral Daily   polyethylene glycol  17 g Oral Daily   senna  2 tablet Oral QHS   sertraline  100 mg Oral Daily   sevelamer carbonate  2,400 mg Oral TID WC   Continuous Infusions:  sodium chloride 250 mL (09/07/21 1041)   ferric gluconate (FERRLECIT) IVPB 125 mg (09/10/21 1002)     LOS: 12 days    Time spent: 35 Minutes.     Kathie Dike, MD [facility]   If 7PM-7AM, please contact night-coverage [email or web address]  09/11/2021, 2:09 PM

## 2021-09-11 NOTE — Progress Notes (Signed)
Orthopedic Tech Progress Note Patient Details:  Scott Castro [birth year] [id number] Called in order to [employer] for [employer] and shrinker  Patient ID: Scott Castro, male   DOB: September 14, 1955, 66 y.o.   MRN: [id number]  [name] 09/11/2021, 10:06 AM

## 2021-09-11 NOTE — Progress Notes (Signed)
This chaplain is present for F/U spiritual care.   The Pt. agreed to the visit and quickly announced "I shaved" when the chaplain entered the room.  When the chaplain asked about the Pt. day, the chaplain listened to the growing positive relationships with rehab and case management. The chaplain understands the Pt. is more optimistic and willing to explore his choices of what life may look like as a double amputee and how he will care for himself.  The chaplain understands care for himself involves the Pt. relationships with friends and family with the goal of returning home.   This chaplain is available for F/U spiritual care as needed.  Chaplain Sallyanne Kuster [phone]

## 2021-09-11 NOTE — Progress Notes (Signed)
Inpatient Rehab Admissions Coordinator:   Followed up with patient who has elected SNF for post-acute rehab. This is in-line with updated therapy recommendations.  Thurmond Butts, LCSW, aware.  Will sign off for CIR at this time.   Shann Medal, PT, DPT Admissions Coordinator [phone] 09/11/21  2:10 PM

## 2021-09-12 LAB — CBC
HCT: 32.1 % — ABNORMAL LOW (ref 39.0–52.0)
Hemoglobin: 9.6 g/dL — ABNORMAL LOW (ref 13.0–17.0)
MCH: 30.4 pg (ref 26.0–34.0)
MCHC: 29.9 g/dL — ABNORMAL LOW (ref 30.0–36.0)
MCV: 101.6 fL — ABNORMAL HIGH (ref 80.0–100.0)
Platelets: 256 10*3/uL (ref 150–400)
RBC: 3.16 MIL/uL — ABNORMAL LOW (ref 4.22–5.81)
RDW: 19.9 % — ABNORMAL HIGH (ref 11.5–15.5)
WBC: 7 10*3/uL (ref 4.0–10.5)
nRBC: 0.4 % — ABNORMAL HIGH (ref 0.0–0.2)

## 2021-09-12 LAB — RENAL FUNCTION PANEL
Albumin: 1.9 g/dL — ABNORMAL LOW (ref 3.5–5.0)
Anion gap: 13 (ref 5–15)
BUN: 38 mg/dL — ABNORMAL HIGH (ref 8–23)
CO2: 25 mmol/L (ref 22–32)
Calcium: 7.3 mg/dL — ABNORMAL LOW (ref 8.9–10.3)
Chloride: 98 mmol/L (ref 98–111)
Creatinine, Ser: 5.49 mg/dL — ABNORMAL HIGH (ref 0.61–1.24)
GFR, Estimated: 11 mL/min — ABNORMAL LOW (ref >60)
Glucose, Bld: 91 mg/dL (ref 70–99)
Phosphorus: 4.1 mg/dL (ref 2.5–4.6)
Potassium: 4.6 mmol/L (ref 3.5–5.1)
Sodium: 136 mmol/L (ref 135–145)

## 2021-09-12 MED ORDER — MIDODRINE HCL 5 MG PO TABS
ORAL_TABLET | ORAL | Status: AC
  Administered 2021-09-12: 10 mg via ORAL
  Filled 2021-09-12: qty 2

## 2021-09-12 NOTE — NC FL2 (Addendum)
[place] MEDICAID FL2 LEVEL OF CARE SCREENING TOOL     IDENTIFICATION  Patient Name: Scott Castro Birthdate: August 10, 1955 Sex: male Admission Date (Current Location): 08/30/2021  [facility] and [place] Number:  Herbalist and Address:  [facility], [place] [address], [place], [place] [address]      Provider Number: [id number]  Attending Physician Name and Address:  Kathie Dike, MD  Relative Name and Phone Number:  Eulis Foster    Current Level of Care: Hospital Recommended Level of Care: [place] Prior Approval Number:    Date Approved/Denied:   PASRR Number: [id number]  Discharge Plan: SNF    Current Diagnoses: Patient Active Problem List   Diagnosis Date Noted   AF (paroxysmal atrial fibrillation) ([place]) 08/30/2021   Depressive reaction    S/P BKA (below knee amputation), right ([place]) 08/12/2021   Ischemic foot ulcer due to atherosclerosis of native artery of limb ([place]) 08/05/2021   Atrial fibrillation ([place]) 08/05/2021   Critical limb ischemia of left lower extremity/PAD  07/18/2021   Atrial fibrillation with RVR ([place]) 07/18/2021   Hypoalbuminemia due to protein-calorie malnutrition ([place]) 07/03/2021   Anemia 07/03/2021   PAD (peripheral artery disease) ([place]) 07/03/2021   GERD (gastroesophageal reflux disease) 07/03/2021   Mixed hyperlipidemia 07/03/2021   Bicipital tenosynovitis 12/04/2020   Localized, primary osteoarthritis of hand 12/04/2020   Hyperkalemia    ESRD (end stage renal disease) ([place]) 09/18/2019   Chronic respiratory failure with hypoxia ([place]) 09/17/2019   COPD (chronic obstructive pulmonary disease) ([place]) 04/10/2019   Nonintractable epilepsy with complex partial seizures ([place]) 08/20/2014   Lung nodule, multiple 07/28/2012   Arteriovenous fistula ([place]) 01/21/2012   At risk for dental problems 01/21/2012   Barrett esophagus 01/21/2012   FSGS (focal segmental glomerulosclerosis) 01/21/2012    Kidney transplanted 01/21/2012    Orientation RESPIRATION BLADDER Height & Weight     Self, Time, Situation, Place  O2 (2L) Continent Weight: 143 lb 4.8 oz (65 kg) Height:  5\' 9"  (175.3 cm)  BEHAVIORAL SYMPTOMS/MOOD NEUROLOGICAL BOWEL NUTRITION STATUS      Continent Diet (please see discharge summary)  AMBULATORY STATUS COMMUNICATION OF NEEDS Skin   Limited Assist Verbally Surgical wounds (closed incision; Left Leg, Rt Thigh, wound incision; Rt Buttocks, Rt Abrasion, Wound incision; skin tear Anterior RT Stump)                       Personal Care Assistance Level of Assistance  Bathing, Feeding, Dressing Bathing Assistance: Maximum assistance Feeding assistance: Independent Dressing Assistance: Maximum assistance     Functional Limitations Info  Sight, Hearing, Speech Sight Info: Impaired (wears glasses) Hearing Info: Adequate Speech Info: Adequate    SPECIAL CARE FACTORS FREQUENCY  PT (By licensed PT), OT (By licensed OT)     PT Frequency: 5x per wek OT Frequency: 5x per week            Contractures Contractures Info: Not present    Additional Factors Info  Code Status, Allergies Code Status Info: FULL Allergies Info: NKA Psychotropic Info: sertraline (ZOLOFT) tablet 100 mg daily         Current Medications (09/12/2021):  This is the current hospital active medication list Current Facility-Administered Medications  Medication Dose Route Frequency Provider Last Rate Last Admin   0.9 %  sodium chloride infusion  250 mL Intravenous PRN Rhyne, Samantha J, PA-C 10 mL/hr at 09/07/21 1041 250 mL at 09/07/21 1041  acetaminophen (TYLENOL) tablet 325-650 mg  325-650 mg Oral Q4H PRN Gabriel Earing, PA-C   650 mg at 09/07/21 0813   albuterol (PROVENTIL) (2.5 MG/3ML) 0.083% nebulizer solution 3 mL  3 mL Inhalation Q6H PRN Rhyne, Samantha J, PA-C       apixaban (ELIQUIS) tablet 5 mg  5 mg Oral BID Kathie Dike, MD   5 mg at 09/12/21 1234   atorvastatin (LIPITOR)  tablet 10 mg  10 mg Oral QHS Rhyne, Samantha J, PA-C   10 mg at 09/11/21 2347   bisacodyl (DULCOLAX) suppository 10 mg  10 mg Rectal Daily PRN Rhyne, Samantha J, PA-C       calcium carbonate (TUMS - dosed in mg elemental calcium) chewable tablet 200 mg of elemental calcium  1 tablet Oral QID PRN Rhyne, Samantha J, PA-C       Chlorhexidine Gluconate Cloth 2 % PADS 6 each  6 each Topical Q0600 Gabriel Earing, PA-C   6 each at 09/12/21 7353   cinacalcet (SENSIPAR) tablet 60 mg  60 mg Oral Q supper Gabriel Earing, PA-C   60 mg at 09/11/21 1654   [START ON 09/18/2021] Darbepoetin Alfa (ARANESP) injection 100 mcg  100 mcg Intravenous Q Fri-HD Kris Mouton, RPH       diltiazem (CARDIZEM CD) 24 hr capsule 120 mg  120 mg Oral Daily Rhyne, Samantha J, PA-C   120 mg at 09/12/21 1233   diphenhydrAMINE (BENADRYL) 12.5 MG/5ML elixir 12.5-25 mg  12.5-25 mg Oral Q6H PRN Rhyne, Samantha J, PA-C       docusate sodium (COLACE) capsule 100 mg  100 mg Oral TID Rhyne, Samantha J, PA-C   100 mg at 09/12/21 1234   ferric gluconate (FERRLECIT) 125 mg in sodium chloride 0.9 % 100 mL IVPB  125 mg Intravenous Q T,Th,Sa-HD [name], MD 110 mL/hr at 09/12/21 1053 125 mg at 09/12/21 1053   gabapentin (NEURONTIN) capsule 100 mg  100 mg Oral BID Rhyne, Samantha J, PA-C   100 mg at 09/12/21 1233   guaiFENesin-dextromethorphan (ROBITUSSIN DM) 100-10 MG/5ML syrup 5-10 mL  5-10 mL Oral Q6H PRN Rhyne, Samantha J, PA-C       hydrocortisone (ANUSOL-HC) suppository 25 mg  25 mg Rectal BID Rhyne, Samantha J, PA-C   25 mg at 09/09/21 2992   HYDROmorphone (DILAUDID) injection 0.5 mg  0.5 mg Intravenous Q3H PRN Gabriel Earing, PA-C   0.5 mg at 09/09/21 1658   metoprolol tartrate (LOPRESSOR) tablet 37.5 mg  37.5 mg Oral BID Rhyne, Samantha J, PA-C   37.5 mg at 09/12/21 1231   midodrine (PROAMATINE) tablet 10 mg  10 mg Oral TID WC Rhyne, Samantha J, PA-C   10 mg at 09/12/21 1233   milk and molasses enema  1 enema Rectal PRN  Rhyne, Samantha J, PA-C       ondansetron (ZOFRAN) injection 4 mg  4 mg Intravenous Q6H PRN Rhyne, Samantha J, PA-C   4 mg at 09/09/21 1416   oxyCODONE (Oxy IR/ROXICODONE) immediate release tablet 10 mg  10 mg Oral Q4H PRN Rhyne, Hulen Shouts, PA-C   10 mg at 09/10/21 2228   pantoprazole (PROTONIX) EC tablet 40 mg  40 mg Oral Daily Rhyne, Samantha J, PA-C   40 mg at 09/12/21 1234   polyethylene glycol (MIRALAX / GLYCOLAX) packet 17 g  17 g Oral Daily Rhyne, Samantha J, PA-C   17 g at 09/08/21 1010   prochlorperazine (COMPAZINE) tablet 5-10 mg  5-10 mg Oral Q6H PRN Gabriel Earing, PA-C   10 mg at 09/04/21 2148   senna (SENOKOT) tablet 17.2 mg  2 tablet Oral QHS Rhyne, Samantha J, PA-C   17.2 mg at 09/11/21 2349   sertraline (ZOLOFT) tablet 100 mg  100 mg Oral Daily Rhyne, Hulen Shouts, PA-C   100 mg at 09/12/21 1232   sevelamer carbonate (RENVELA) tablet 2,400 mg  2,400 mg Oral TID WC Kathie Dike, MD   2,400 mg at 09/12/21 1233   sevelamer carbonate (RENVELA) tablet 800 mg  800 mg Oral TID PRN Gabriel Earing, PA-C   800 mg at 09/03/21 1504   witch hazel-glycerin (TUCKS) pad   Topical PRN Gabriel Earing, PA-C         Discharge Medications: Please see discharge summary for a list of discharge medications.  Relevant Imaging Results:  Relevant Lab Results:   Additional Information SSN [id number]  HD patient   MWF @ [facility]-  will attmept to clip near facility if bed ofer is made.  Bary Castilla, LCSW

## 2021-09-12 NOTE — TOC Initial Note (Addendum)
Transition of Care [facility]) - Initial/Assessment Note    Patient Details  Name: Scott Castro MRN: [id number] Date of Birth: 02/15/1956  Transition of [facility]) CM/SW Contact:    Bary Castilla, LCSW Phone Number:[phone] 09/12/2021, 3:44 PM  Clinical Narrative:                  CSW met with pt and pt's relative Lorie to confirm his decision to pursue SNF. Pt stated that he willing to go to any SNF and is willing to change his HD center if needed. CSW explained that options are more limited due to HD.CSW received permission to send out to facilities in [place] and [place]. CSW provided pt with medicare.gov rating list. CSW answered questions and provided clarity.  TOC team will continue to assist with discharge planning needs.   Expected Discharge Plan: Skilled Nursing Facility Barriers to Discharge: Continued Medical Work up, SNF Pending bed offer   Patient Goals and CMS Choice Patient states their goals for this hospitalization and ongoing recovery are:: To be able to go home CMS Medicare.gov Compare Post Acute Care list provided to:: Patient Choice offered to / list presented to : Patient  Expected Discharge Plan and Services Expected Discharge Plan: [place]                                              Prior Living Arrangements/Services     Patient language and need for interpreter reviewed:: Yes          Care giver support system in place?: Yes (comment)      Activities of Daily Living Home Assistive Devices/Equipment: Wheelchair ADL Screening (condition at time of admission) Patient's cognitive ability adequate to safely complete daily activities?: Yes Is the patient deaf or have difficulty hearing?: No Does the patient have difficulty seeing, even when wearing glasses/contacts?: No Does the patient have difficulty concentrating, remembering, or making decisions?: No Patient able to express need for assistance with ADLs?:  Yes Does the patient have difficulty dressing or bathing?: No Independently performs ADLs?: Yes (appropriate for developmental age) Does the patient have difficulty walking or climbing stairs?: Yes Weakness of Legs: Both Weakness of Arms/Hands: None  Permission Sought/Granted   Permission granted to share information with : Yes, Verbal Permission Granted  Share Information with NAME: Lorie  Permission granted to share info w AGENCY: SNFs  Permission granted to share info w Relationship: Relative  Permission granted to share info w Contact Information: [phone]  Emotional Assessment Appearance:: Appears stated age Attitude/Demeanor/Rapport: Engaged Affect (typically observed): Accepting Orientation: : Oriented to Self, Oriented to Place, Oriented to  Time, Oriented to Situation      Admission diagnosis:  Critical limb ischemia of left lower extremity (HCC) [I70.222] AF (paroxysmal atrial fibrillation) (HCC) [I48.0] Patient Active Problem List   Diagnosis Date Noted   AF (paroxysmal atrial fibrillation) ([place]) 08/30/2021   Depressive reaction    S/P BKA (below knee amputation), right ([place]) 08/12/2021   Ischemic foot ulcer due to atherosclerosis of native artery of limb ([place]) 08/05/2021   Atrial fibrillation ([place]) 08/05/2021   Critical limb ischemia of left lower extremity/PAD  07/18/2021   Atrial fibrillation with RVR ([place]) 07/18/2021   Hypoalbuminemia due to protein-calorie malnutrition ([place]) 07/03/2021   Anemia 07/03/2021   PAD (peripheral artery disease) ([place]) 07/03/2021  GERD (gastroesophageal reflux disease) 07/03/2021   Mixed hyperlipidemia 07/03/2021   Bicipital tenosynovitis 12/04/2020   Localized, primary osteoarthritis of hand 12/04/2020   Hyperkalemia    ESRD (end stage renal disease) ([place]) 09/18/2019   Chronic respiratory failure with hypoxia ([place]) 09/17/2019   COPD (chronic obstructive pulmonary disease) ([place]) 04/10/2019   Nonintractable epilepsy with  complex partial seizures ([place]) 08/20/2014   Lung nodule, multiple 07/28/2012   Arteriovenous fistula ([place]) 01/21/2012   At risk for dental problems 01/21/2012   Barrett esophagus 01/21/2012   FSGS (focal segmental glomerulosclerosis) 01/21/2012   Kidney transplanted 01/21/2012   PCP:  Franciso Bend, MD Pharmacy:   [id number] [place]Phone: [phone]: [phone] [facility] [address]Phone: [phone]: [phone]    Social Determinants of Health (SDOH) Interventions    Readmission Risk Interventions Readmission Risk Prevention Plan 07/22/2021 04/10/2019  Transportation Screening Complete Complete  PCP or Specialist Appt within 3-5 Days Complete Complete  [place] or [place]  Social Work Consult for [place]Counseling Complete Complete  Palliative Care Screening Not Applicable -  Medication Review (Press photographer Complete Complete  Some recent data might be hidden

## 2021-09-12 NOTE — Progress Notes (Signed)
Scott NOTE    MORRILL Castro  OMV:[id number] DOB: [birth year] DOA: 08/30/2021 PCP: Franciso Bend, MD   Brief Narrative: 66 year old past medical history significant for ESRD on dialysis TTS, hypertension, hyperlipidemia, PAF, depression, chronic respiratory failure 2 L of oxygen, PAD recent right BKA on 08/07/2021 secondary to critical limb ischemia discharged to rehab.  He was seen by cardiology in inpatient rehab due to A. fib RVR and he was referred for inpatient telemetry admission.  Recent hospitalized at [facility] from 12/28 until 1//2023 right lower extremity critical limb ischemia status post BKA by vascular on 12/30.  Was just discharged to inpatient rehab here at [facility].  Found to be in RVR RVR admitted for further evaluation.  Patient  also had angiography 1/20 by Dr. [name] with BL diffuse infrapopliteal disease of the left lower extremity, vascular is recommended  left BKA. Patient underwent Left BKA 1/25.  Patient noted to be more hypoxic on 1/23, chest x-ray showed bilateral loculated pleural effusion, pulmonologist consulted for further evaluation. CT chest showed right loculated pleural effusion on right. Right lower lobe with irregular consolidation right lower lobe, he will need PET scan out patient. His oxygen requirement has remain stable. No safe window for thoracentesis. He will need CT chest out patient follow up   He develops anterior flap necrosis of right BKA> vascular planning AKA on 2/1. \ Plan is for CIR versus SNF when stable.     Assessment & Plan:   Principal Problem:   Critical limb ischemia of left lower extremity/PAD  Active Problems:   COPD (chronic obstructive pulmonary disease) (HCC)   Chronic respiratory failure with hypoxia (HCC)   ESRD (end stage renal disease) (HCC)   Barrett esophagus   Anemia   Mixed hyperlipidemia   S/P BKA (below knee amputation), right (HCC)   Depressive reaction   AF (paroxysmal atrial fibrillation) (HCC)  1-Critical  Limb ischemia of left lower extremity/PAD -Angiography 1/20 by Dr. [name] revealed diffuse infrapopliteal disease of the left with no revascularization options. -Recent BKA on the right 12/22 -Underwent  left BKA 1/25th -Tolerated procedure well.  -Dressing  removed 1/27. Unfortunately developed necrotic flap on right BKA and was transitioned to right AKA on 2/1 -Further postop care per vascular surgery  2-A. Fib Continue with  metoprolol and Cardizem.  Appreciate cardiology assistance.  On anticoagulation with Eliquis HR ranging between 100-115 Per cardiology notes, this is a reasonable range. Unless HR begins to sustain higher or patient becomes symptomatic, no further adjustment of meds at this time  3-Acute on chronic hypoxic respiratory failure Loculated bilateral moderate pleural effusion Moderate Pulmonary HTN;  COPD -Pulmonologist consulted. -Incentive Spirometry ordered. -Continue with nebulizer as needed -CT chest 1/23: Patchy nodularity and ground-glass in the dependent RIGHT upper lobe and bilateral lower lobes raising the question of pneumonitis with distribution raising the question of sequela of aspiration. More dense consolidative changes and irregular bronchial structures in the RIGHT lower lobe central to an area of rounded atelectasis is of uncertain significance. Given the more mass like appearance in this area and increasing size of mediastinal lymph nodes over time a possibility of neoplasm is considered. PET scan may be helpful on follow-up in addition to contrasted imaging with CT for further assessment.  Loculated and mildly nodular appearing pleural fluid in the RIGHT chest with increase and with decreased pleural fluid in the LEFT chest compared to prior imaging.  -IR consulted for thoracentesis.  There was no fluid pocket identified  that was safe to access by ultrasound thoracentesis. Stable on 2 L oxygen.  Needs follow up CT chest out patient.    4-ESRD on  hemodialysis -S/p renal transplant [place] with hemodialysis -Nephrology  following.   Barrett esophagus: Continue with PPI  Anemia:  anemia of chronic disease: IV iron per nephrology Hemoglobin currently stable  Hyperlipidemia: Continue with statins  Depression: Continue with Zoloft  Hyponatremia; fluid management with HD>   Severe tricuspid regurgitation, mild mitral regurgitation. Management per cardiology.     Disposition will be to CIR versus skilled nursing facility  Estimated body mass index is 21.16 kg/m as calculated from the following:   Height as of this encounter: 5\' 9"  (1.753 m).   Weight as of this encounter: 65 kg.   DVT prophylaxis: eliquis.  Code Status: Full code Family Communication: care discussed with patient. His friend was at bedside, but patient gave permission to discuss his care Disposition Plan:  Status is: Inpatient  Remains inpatient appropriate because: management of critical limb ischemia.      Consultants:  Nephrology Cardiology Vascular.  Pulmonologist   Procedures:  Left BKA 1/25 Right AKA 2/1  Antimicrobials:    Subjective: He is feeling better today.  No new complaints   Objective: Vitals:   09/12/21 1222 09/12/21 1533 09/12/21 1541 09/12/21 1953  BP: 129/90 108/75 105/74 110/79  Pulse: (!) 106 (!) 111 (!) 111 (!) 115  Resp: 20 (!) 27 18 20   Temp: 98.3 F (36.8 C) 98.2 F (36.8 C) 98.1 F (36.7 C) 98.6 F (37 C)  TempSrc: Oral Oral Oral Oral  SpO2: 100% 98% 100% 98%  Weight:      Height:        Intake/Output Summary (Last 24 hours) at 09/12/2021 2054 Last data filed at 09/12/2021 1954 Gross per 24 hour  Intake 580 ml  Output 1000 ml  Net -420 ml   Filed Weights   09/11/21 0500 09/12/21 0800 09/12/21 1146  Weight: 66.5 kg 66.1 kg 65 kg    Examination:  General exam: Chronic ill appearing Respiratory system: Clear bilaterally Cardiovascular system: S 1, S 2 irregular Systolic  murmur Gastrointestinal system: BS present, soft, nt Central nervous system: Alert follows command Extremities: R AKA , left BKA     Data Reviewed: I have personally reviewed following labs and imaging studies  CBC: Recent Labs  Lab 09/08/21 0230 09/09/21 0124 09/10/21 0334 09/11/21 0135 09/12/21 0201  WBC 5.0 7.1 8.7 7.4 7.0  HGB 8.7* 9.7* 9.4* 9.9* 9.6*  HCT 28.3* 31.8* 30.3* 32.1* 32.1*  MCV 99.6 100.0 99.0 99.7 101.6*  PLT 187 240 248 270 778   Basic Metabolic Panel: Recent Labs  Lab 09/07/21 1430 09/10/21 0334 09/11/21 0135 09/12/21 0807  NA 130* 132* 134* 136  K 4.7 5.6* 4.0 4.6  CL 91* 97* 97* 98  CO2 25 23 26 25   GLUCOSE 88 122* 110* 91  BUN 35* 44* 26* 38*  CREATININE 5.33* 6.10* 3.96* 5.49*  CALCIUM 7.8* 7.3* 7.6* 7.3*  PHOS 4.4 6.4* 3.8 4.1   GFR: Estimated Creatinine Clearance: 12.2 mL/min (A) (by C-G formula based on SCr of 5.49 mg/dL (H)). Liver Function Tests: Recent Labs  Lab 09/07/21 1430 09/10/21 0334 09/11/21 0135 09/12/21 0807  ALBUMIN 1.9* 1.8* 1.9* 1.9*   No results for input(s): LIPASE, AMYLASE in the last 168 hours. No results for input(s): AMMONIA in the last 168 hours. Coagulation Profile: No results for input(s): INR, PROTIME in the last 168  hours. Cardiac Enzymes: No results for input(s): CKTOTAL, CKMB, CKMBINDEX, TROPONINI in the last 168 hours. BNP (last 3 results) No results for input(s): PROBNP in the last 8760 hours. HbA1C: No results for input(s): HGBA1C in the last 72 hours. CBG: No results for input(s): GLUCAP in the last 168 hours.  Lipid Profile: No results for input(s): CHOL, HDL, LDLCALC, TRIG, CHOLHDL, LDLDIRECT in the last 72 hours.  Thyroid Function Tests: No results for input(s): TSH, T4TOTAL, FREET4, T3FREE, THYROIDAB in the last 72 hours. Anemia Panel: No results for input(s): VITAMINB12, FOLATE, FERRITIN, TIBC, IRON, RETICCTPCT in the last 72 hours. Sepsis Labs: No results for input(s): PROCALCITON,  LATICACIDVEN in the last 168 hours.  No results found for this or any previous visit (from the past 240 hour(s)).       Radiology Studies: No results found.      Scheduled Meds:  apixaban  5 mg Oral BID   atorvastatin  10 mg Oral QHS   Chlorhexidine Gluconate Cloth  6 each Topical Q0600   cinacalcet  60 mg Oral Q supper   [START ON 09/18/2021] Darbepoetin Alfa  100 mcg Intravenous Q Fri-HD   diltiazem  120 mg Oral Daily   docusate sodium  100 mg Oral TID   gabapentin  100 mg Oral BID   hydrocortisone  25 mg Rectal BID   metoprolol tartrate  37.5 mg Oral BID   midodrine  10 mg Oral TID WC   pantoprazole  40 mg Oral Daily   polyethylene glycol  17 g Oral Daily   senna  2 tablet Oral QHS   sertraline  100 mg Oral Daily   sevelamer carbonate  2,400 mg Oral TID WC   Continuous Infusions:  sodium chloride 250 mL (09/07/21 1041)   ferric gluconate (FERRLECIT) IVPB 125 mg (09/12/21 1053)     LOS: 13 days    Time spent: 35 Minutes.     Kathie Dike, MD [facility]   If 7PM-7AM, please contact night-coverage [email or web address]  09/12/2021, 8:54 PM

## 2021-09-12 NOTE — Progress Notes (Signed)
Scott Castro Progress Note   66 y.o. male PMH COPD Barrett's, hyperparathyroidism, ESRD, failed renal transplant, PAF on Eliquis p/w ischemic foot ulcer -> BKA on 12/30.  Patient receives his dialysis treatments at [place] with [facility].   Dialysis at [facility] with [facility] EDW 68.4kg? EDW before the amputation was 72kg     Assessment/ Plan:   ESRD - TTS at [facility] w/ [facility] now MWF - establishing new EDW  -  Uses elisio dialyzer at [place] but has been tolerating our standard optiflux dialyzer here -Off schedule related to surgery.  Plan for dialysis today and then return to Monday Wednesday Friday schedule next week.  [name]- phos at goal on renvela. Ca corrects to normal.  Anemia - on ESA and s/p iron load.  Required transfusion this hosp.  ^ ESA dose.  Hemoglobin stable around 9-10  PAD with critical limb ischemia s/p rt BKA for gangrene - now will require LBKA -  done 1/25.  Now status post right AKA on 09/09/2021  PAF - rate controlled and  Eliquis typically. On dilt and metop - cont even prior to dialysis.  Was having some RVR periodically during this admission and cardiology has been consulted - titrating BB and watching BP.  He's on midodrine now as well.  GERD COPD   9. Dispo: private transport to [place] HD unit from his [place] home too costly, will switch from [name] to [facility]- MWF-  has spot.   Possibly going back to CIR or SNF    Subjective:    Patient continues to feel well today.  No specific complaints.  Minimal pain in leg  BP (!) 146/88    Pulse (!) 109    Temp (!) 97.1 F (36.2 C) (Oral)    Resp 16    Ht 5\' 9"  (1.753 m)    Wt 66.1 kg    SpO2 100%    BMI 21.52 kg/m   Intake/Output Summary (Last 24 hours) at 09/12/2021 1194 Last data filed at 09/12/2021 1740 Gross per 24 hour  Intake 240 ml  Output 0 ml  Net 240 ml   Weight change:   Physical Exam: GEN: Lying in bed, no distress HEENT: MMM, no nasal  discharge LUNGS: Bilateral chest rise with no increased work of breathing CV: Tachycardia, no edema ABD: SNDNT +BS  EXT:R AKA w/ wound vac, L new BKA wraped  Imaging: No results found.  Labs: BMET Recent Labs  Lab 09/07/21 1430 09/10/21 0334 09/11/21 0135 09/12/21 0807  NA 130* 132* 134* 136  K 4.7 5.6* 4.0 4.6  CL 91* 97* 97* 98  CO2 25 23 26 25   GLUCOSE 88 122* 110* 91  BUN 35* 44* 26* 38*  CREATININE 5.33* 6.10* 3.96* 5.49*  CALCIUM 7.8* 7.3* 7.6* 7.3*  PHOS 4.4 6.4* 3.8 4.1   CBC Recent Labs  Lab 09/09/21 0124 09/10/21 0334 09/11/21 0135 09/12/21 0201  WBC 7.1 8.7 7.4 7.0  HGB 9.7* 9.4* 9.9* 9.6*  HCT 31.8* 30.3* 32.1* 32.1*  MCV 100.0 99.0 99.7 101.6*  PLT 240 248 270 256    Medications:     apixaban  5 mg Oral BID   atorvastatin  10 mg Oral QHS   Chlorhexidine Gluconate Cloth  6 each Topical Q0600   cinacalcet  60 mg Oral Q supper   [START ON 09/18/2021] Darbepoetin Alfa  100 mcg Intravenous Q Fri-HD   diltiazem  120 mg Oral Daily   docusate  sodium  100 mg Oral TID   gabapentin  100 mg Oral BID   hydrocortisone  25 mg Rectal BID   metoprolol tartrate  37.5 mg Oral BID   midodrine  10 mg Oral TID WC   pantoprazole  40 mg Oral Daily   polyethylene glycol  17 g Oral Daily   senna  2 tablet Oral QHS   sertraline  100 mg Oral Daily   sevelamer carbonate  2,400 mg Oral TID WC    [name]  [facility]

## 2021-09-13 LAB — CBC
HCT: 31.9 % — ABNORMAL LOW (ref 39.0–52.0)
Hemoglobin: 9.5 g/dL — ABNORMAL LOW (ref 13.0–17.0)
MCH: 30.2 pg (ref 26.0–34.0)
MCHC: 29.8 g/dL — ABNORMAL LOW (ref 30.0–36.0)
MCV: 101.3 fL — ABNORMAL HIGH (ref 80.0–100.0)
Platelets: 274 10*3/uL (ref 150–400)
RBC: 3.15 MIL/uL — ABNORMAL LOW (ref 4.22–5.81)
RDW: 20 % — ABNORMAL HIGH (ref 11.5–15.5)
WBC: 5.8 10*3/uL (ref 4.0–10.5)
nRBC: 1.2 % — ABNORMAL HIGH (ref 0.0–0.2)

## 2021-09-13 MED ORDER — SODIUM CHLORIDE 0.9 % IV SOLN
125.0000 mg | INTRAVENOUS | Status: AC
  Administered 2021-09-14 – 2021-09-16 (×2): 125 mg via INTRAVENOUS
  Filled 2021-09-13 (×3): qty 10

## 2021-09-13 NOTE — Progress Notes (Signed)
[facility] Progress Note   66 y.o. male PMH COPD Barrett's, hyperparathyroidism, ESRD, failed renal transplant, PAF on Eliquis p/w ischemic foot ulcer -> BKA on 12/30.  Patient receives his dialysis treatments at [place] with [facility].   Dialysis at [facility] with [facility] EDW 68.4kg? EDW before the amputation was 72kg     Assessment/ Plan:   ESRD - TTS at [facility] w/ [facility] now MWF - establishing new EDW  -  Uses elisio dialyzer at [place] but has been tolerating our standard optiflux dialyzer here -Last dialysis Saturday.  Return to MWF schedule starting tomorrow  [name]- phos at goal on renvela. Ca corrects to normal.  Anemia - on ESA and s/p iron load.  Required transfusion this hosp.  ^ ESA dose.  Hemoglobin stable around 9-10  PAD with critical limb ischemia s/p rt BKA for gangrene - now will require LBKA -  done 1/25.  Now status post right AKA on 09/09/2021  PAF - rate controlled and  Eliquis typically. On dilt and metop - cont even prior to dialysis.  Was having some RVR periodically during this admission and cardiology has been consulted - titrating BB and watching BP.  He's on midodrine now as well.  GERD COPD   9. Dispo: private transport to [place] HD unit from his [place] home too costly, will switch from [name] to [facility]- MWF-  has spot.   Possibly going back to CIR or SNF    Subjective:    Patient tolerated dialysis yesterday with no issues.  No complaints today  BP 121/85 (BP Location: Right Arm)    Pulse (!) 111    Temp 98 F (36.7 C) (Oral)    Resp (!) 23    Ht 5\' 9"  (1.753 m)    Wt 66 kg    SpO2 100%    BMI 21.49 kg/m   Intake/Output Summary (Last 24 hours) at 09/13/2021 1026 Last data filed at 09/13/2021 0343 Gross per 24 hour  Intake 800 ml  Output 1000 ml  Net -200 ml   Weight change:   Physical Exam: GEN: Lying in bed, no distress HEENT: MMM, no nasal discharge LUNGS: Bilateral chest rise with no  increased work of breathing CV: Tachycardia, no edema ABD: SNDNT +BS  EXT:R AKA w/ wound vac, L new BKA wraped  Imaging: No results found.  Labs: BMET Recent Labs  Lab 09/07/21 1430 09/10/21 0334 09/11/21 0135 09/12/21 0807  NA 130* 132* 134* 136  K 4.7 5.6* 4.0 4.6  CL 91* 97* 97* 98  CO2 25 23 26 25   GLUCOSE 88 122* 110* 91  BUN 35* 44* 26* 38*  CREATININE 5.33* 6.10* 3.96* 5.49*  CALCIUM 7.8* 7.3* 7.6* 7.3*  PHOS 4.4 6.4* 3.8 4.1   CBC Recent Labs  Lab 09/10/21 0334 09/11/21 0135 09/12/21 0201 09/13/21 0207  WBC 8.7 7.4 7.0 5.8  HGB 9.4* 9.9* 9.6* 9.5*  HCT 30.3* 32.1* 32.1* 31.9*  MCV 99.0 99.7 101.6* 101.3*  PLT 248 270 256 274    Medications:     apixaban  5 mg Oral BID   atorvastatin  10 mg Oral QHS   Chlorhexidine Gluconate Cloth  6 each Topical Q0600   cinacalcet  60 mg Oral Q supper   [START ON 09/18/2021] Darbepoetin Alfa  100 mcg Intravenous Q Fri-HD   diltiazem  120 mg Oral Daily   docusate sodium  100 mg Oral TID   gabapentin  100  mg Oral BID   hydrocortisone  25 mg Rectal BID   metoprolol tartrate  37.5 mg Oral BID   midodrine  10 mg Oral TID WC   pantoprazole  40 mg Oral Daily   polyethylene glycol  17 g Oral Daily   senna  2 tablet Oral QHS   sertraline  100 mg Oral Daily   sevelamer carbonate  2,400 mg Oral TID WC    [name]  [facility]

## 2021-09-13 NOTE — Progress Notes (Addendum)
°  Progress Note    09/13/2021 9:19 AM 4 Days Post-Op  Subjective:  no major complaints   Vitals:   09/13/21 0741 09/13/21 0756  BP: 126/87 121/85  Pulse: (!) 110 (!) 111  Resp: 11 (!) 23  Temp: 98.2 F (36.8 C) 98 F (36.7 C)  SpO2: 100% 100%   Physical Exam: Cardiac:  tachy Lungs:  non labored Incisions:  right Prevena intact with good suction  Extremities: left BKA with some serosanguinous drainage on dressings. No appreciable drainage once dressings removed. Well appearing with viable flaps Neurologic: alert and oriented  CBC    Component Value Date/Time   WBC 5.8 09/13/2021 0207   RBC 3.15 (L) 09/13/2021 0207   HGB 9.5 (L) 09/13/2021 0207   HGB 11.4 (L) 06/11/2012 0501   HCT 31.9 (L) 09/13/2021 0207   HCT 34.6 (L) 06/11/2012 0501   PLT 274 09/13/2021 0207   PLT 251 06/11/2012 0501   MCV 101.3 (H) 09/13/2021 0207   MCV 95 06/11/2012 0501   MCH 30.2 09/13/2021 0207   MCHC 29.8 (L) 09/13/2021 0207   RDW 20.0 (H) 09/13/2021 0207   RDW 14.4 06/11/2012 0501   LYMPHSABS 0.8 08/26/2021 1004   LYMPHSABS 1.2 06/11/2012 0501   MONOABS 0.9 08/26/2021 1004   MONOABS 0.7 06/11/2012 0501   EOSABS 0.0 08/26/2021 1004   EOSABS 0.2 06/11/2012 0501   BASOSABS 0.0 08/26/2021 1004   BASOSABS 0.1 06/11/2012 0501    BMET    Component Value Date/Time   NA 136 09/12/2021 0807   NA 142 06/10/2012 0924   K 4.6 09/12/2021 0807   K 4.5 06/16/2013 1315   CL 98 09/12/2021 0807   CL 102 06/10/2012 0924   CO2 25 09/12/2021 0807   CO2 29 06/10/2012 0924   GLUCOSE 91 09/12/2021 0807   GLUCOSE 83 06/10/2012 0924   BUN 38 (H) 09/12/2021 0807   BUN 35 (H) 06/10/2012 0924   CREATININE 5.49 (H) 09/12/2021 0807   CREATININE 10.29 (H) 06/10/2012 0924   CALCIUM 7.3 (L) 09/12/2021 0807   CALCIUM 9.2 06/10/2012 0924   GFRNONAA 11 (L) 09/12/2021 0807   GFRNONAA 5 (L) 06/10/2012 0924   GFRAA 9 (L) 09/18/2019 0430   GFRAA 6 (L) 06/10/2012 0924    INR    Component Value Date/Time    INR 1.7 (H) 08/05/2021 1846     Intake/Output Summary (Last 24 hours) at 09/13/2021 0919 Last data filed at 09/13/2021 0343 Gross per 24 hour  Intake 800 ml  Output 1000 ml  Net -200 ml     Assessment/Plan:  66 y.o. male is s/p Right AKA 4 Days Post-Op Left BKA 11 Days Post-Op.  Right AKA with Prevena incisional VAC. Will be removed in 3 days or prior to D/c Left BKA well appearing. Still some serosanguinous drainage on dressings but no active drainage appreciable. Stump sock applied Pain overall well controlled Continue Therapies Will have follow up in 1 month to remove bilateral staples  Karoline Caldwell, PA-C [facility] [phone] 09/13/2021 9:19 AM  I agree with the above.  I have seen and evaluated the patient.  Left BKA dressing was changed today.  Mild drainage on the dressing.  This will need to be closely monitored.  Prevena VAC is in place on the right.  Annamarie Major

## 2021-09-13 NOTE — Progress Notes (Signed)
PROGRESS NOTE    Scott Castro  WNU:[id number] DOB: 10-02-1955 DOA: 08/30/2021 PCP: Franciso Bend, MD   Brief Narrative: 66 year old past medical history significant for ESRD on dialysis TTS, hypertension, hyperlipidemia, PAF, depression, chronic respiratory failure 2 L of oxygen, PAD recent right BKA on 08/07/2021 secondary to critical limb ischemia discharged to rehab.  He was seen by cardiology in inpatient rehab due to A. fib RVR and he was referred for inpatient telemetry admission.  Recent hospitalized at [facility] from 12/28 until 1//2023 right lower extremity critical limb ischemia status post BKA by vascular on 12/30.  Was just discharged to inpatient rehab here at [facility].  Found to be in RVR RVR admitted for further evaluation.  Patient  also had angiography 1/20 by Dr. [name] with BL diffuse infrapopliteal disease of the left lower extremity, vascular is recommended  left BKA. Patient underwent Left BKA 1/25.  Patient noted to be more hypoxic on 1/23, chest x-ray showed bilateral loculated pleural effusion, pulmonologist consulted for further evaluation. CT chest showed right loculated pleural effusion on right. Right lower lobe with irregular consolidation right lower lobe, he will need PET scan out patient. His oxygen requirement has remain stable. No safe window for thoracentesis. He will need CT chest out patient follow up   He develops anterior flap necrosis of right BKA> vascular planning AKA on 2/1. \ Plan is for CIR versus SNF when stable.     Assessment & Plan:   Principal Problem:   Critical limb ischemia of left lower extremity/PAD  Active Problems:   COPD (chronic obstructive pulmonary disease) (HCC)   Chronic respiratory failure with hypoxia (HCC)   ESRD (end stage renal disease) (HCC)   Barrett esophagus   Anemia   Mixed hyperlipidemia   S/P BKA (below knee amputation), right (HCC)   Depressive reaction   AF (paroxysmal atrial fibrillation) (HCC)  1-Critical  Limb ischemia of left lower extremity/PAD -Angiography 1/20 by Dr. [name] revealed diffuse infrapopliteal disease of the left with no revascularization options. -Recent BKA on the right 12/22 -Underwent  left BKA 1/25th -Tolerated procedure well.  -Dressing  removed 1/27. Unfortunately developed necrotic flap on right BKA and was transitioned to right AKA on 2/1 -Further postop care per vascular surgery  2-A. Fib Continue with  metoprolol and Cardizem.  Appreciate cardiology assistance.  On anticoagulation with Eliquis HR ranging between 100-115 Per cardiology notes, this is a reasonable range. Unless HR begins to sustain higher or patient becomes symptomatic, no further adjustment of meds at this time  3-Acute on chronic hypoxic respiratory failure Loculated bilateral moderate pleural effusion Moderate Pulmonary HTN;  COPD -Pulmonologist consulted. -Incentive Spirometry ordered. -Continue with nebulizer as needed -CT chest 1/23: Patchy nodularity and ground-glass in the dependent RIGHT upper lobe and bilateral lower lobes raising the question of pneumonitis with distribution raising the question of sequela of aspiration. More dense consolidative changes and irregular bronchial structures in the RIGHT lower lobe central to an area of rounded atelectasis is of uncertain significance. Given the more mass like appearance in this area and increasing size of mediastinal lymph nodes over time a possibility of neoplasm is considered. PET scan may be helpful on follow-up in addition to contrasted imaging with CT for further assessment.  Loculated and mildly nodular appearing pleural fluid in the RIGHT chest with increase and with decreased pleural fluid in the LEFT chest compared to prior imaging.  -IR consulted for thoracentesis.  There was no fluid pocket identified  that was safe to access by ultrasound thoracentesis. Stable on 2 L oxygen.  Needs follow up CT chest out patient.    4-ESRD on  hemodialysis -S/p renal transplant [place] with hemodialysis -Nephrology  following.   Barrett esophagus: Continue with PPI  Anemia:  anemia of chronic disease: IV iron per nephrology Hemoglobin currently stable  Hyperlipidemia: Continue with statins  Depression: Continue with Zoloft  Hyponatremia; fluid management with HD>   Severe tricuspid regurgitation, mild mitral regurgitation. Management per cardiology.     Disposition will be to CIR versus skilled nursing facility.  He is stable to discharge once disposition can be determined.  Estimated body mass index is 21.49 kg/m as calculated from the following:   Height as of this encounter: 5\' 9"  (1.753 m).   Weight as of this encounter: 66 kg.   DVT prophylaxis: eliquis.  Code Status: DNR Family Communication: care discussed with patient.  Disposition Plan:  Status is: Inpatient  Remains inpatient appropriate because: management of critical limb ischemia.      Consultants:  Nephrology Cardiology Vascular.  Pulmonologist  Palliative care  Procedures:  Left BKA 1/25 Right AKA 2/1  Antimicrobials:    Subjective: He does not have any new complaints today.  He is feeling better.  Feels more hopeful about his recovery and rehab.  Objective: Vitals:   09/13/21 1100 09/13/21 1205 09/13/21 1542 09/13/21 2035  BP: 114/80 108/83 103/74 106/76  Pulse: (!) 113  (!) 112 (!) 113  Resp: 16 18 18 18   Temp: 98.1 F (36.7 C) 97.9 F (36.6 C) 98.5 F (36.9 C) 98.3 F (36.8 C)  TempSrc: Oral Oral Oral Oral  SpO2: 100%  99% 99%  Weight:      Height:        Intake/Output Summary (Last 24 hours) at 09/13/2021 2122 Last data filed at 09/13/2021 1305 Gross per 24 hour  Intake 460 ml  Output 2 ml  Net 458 ml   Filed Weights   09/12/21 0800 09/12/21 1146 09/13/21 0500  Weight: 66.1 kg 65 kg 66 kg    Examination:  General exam: Chronic ill appearing Respiratory system: Clear bilaterally Cardiovascular  system: S 1, S 2 irregular Systolic murmur Gastrointestinal system: BS present, soft, nt Central nervous system: Alert follows command Extremities: R AKA , left BKA     Data Reviewed: I have personally reviewed following labs and imaging studies  CBC: Recent Labs  Lab 09/09/21 0124 09/10/21 0334 09/11/21 0135 09/12/21 0201 09/13/21 0207  WBC 7.1 8.7 7.4 7.0 5.8  HGB 9.7* 9.4* 9.9* 9.6* 9.5*  HCT 31.8* 30.3* 32.1* 32.1* 31.9*  MCV 100.0 99.0 99.7 101.6* 101.3*  PLT 240 248 270 256 329   Basic Metabolic Panel: Recent Labs  Lab 09/07/21 1430 09/10/21 0334 09/11/21 0135 09/12/21 0807  NA 130* 132* 134* 136  K 4.7 5.6* 4.0 4.6  CL 91* 97* 97* 98  CO2 25 23 26 25   GLUCOSE 88 122* 110* 91  BUN 35* 44* 26* 38*  CREATININE 5.33* 6.10* 3.96* 5.49*  CALCIUM 7.8* 7.3* 7.6* 7.3*  PHOS 4.4 6.4* 3.8 4.1   GFR: Estimated Creatinine Clearance: 12.4 mL/min (A) (by C-G formula based on SCr of 5.49 mg/dL (H)). Liver Function Tests: Recent Labs  Lab 09/07/21 1430 09/10/21 0334 09/11/21 0135 09/12/21 0807  ALBUMIN 1.9* 1.8* 1.9* 1.9*   No results for input(s): LIPASE, AMYLASE in the last 168 hours. No results for input(s): AMMONIA in the last 168 hours. Coagulation Profile:  No results for input(s): INR, PROTIME in the last 168 hours. Cardiac Enzymes: No results for input(s): CKTOTAL, CKMB, CKMBINDEX, TROPONINI in the last 168 hours. BNP (last 3 results) No results for input(s): PROBNP in the last 8760 hours. HbA1C: No results for input(s): HGBA1C in the last 72 hours. CBG: No results for input(s): GLUCAP in the last 168 hours.  Lipid Profile: No results for input(s): CHOL, HDL, LDLCALC, TRIG, CHOLHDL, LDLDIRECT in the last 72 hours.  Thyroid Function Tests: No results for input(s): TSH, T4TOTAL, FREET4, T3FREE, THYROIDAB in the last 72 hours. Anemia Panel: No results for input(s): VITAMINB12, FOLATE, FERRITIN, TIBC, IRON, RETICCTPCT in the last 72 hours. Sepsis  Labs: No results for input(s): PROCALCITON, LATICACIDVEN in the last 168 hours.  No results found for this or any previous visit (from the past 240 hour(s)).       Radiology Studies: No results found.      Scheduled Meds:  apixaban  5 mg Oral BID   atorvastatin  10 mg Oral QHS   Chlorhexidine Gluconate Cloth  6 each Topical Q0600   cinacalcet  60 mg Oral Q supper   [START ON 09/18/2021] Darbepoetin Alfa  100 mcg Intravenous Q Fri-HD   diltiazem  120 mg Oral Daily   docusate sodium  100 mg Oral TID   gabapentin  100 mg Oral BID   hydrocortisone  25 mg Rectal BID   metoprolol tartrate  37.5 mg Oral BID   midodrine  10 mg Oral TID WC   pantoprazole  40 mg Oral Daily   polyethylene glycol  17 g Oral Daily   senna  2 tablet Oral QHS   sertraline  100 mg Oral Daily   sevelamer carbonate  2,400 mg Oral TID WC   Continuous Infusions:  sodium chloride 250 mL (09/07/21 1041)   [START ON 09/14/2021] ferric gluconate (FERRLECIT) IVPB       LOS: 14 days    Time spent: 35 Minutes.     Kathie Dike, MD [facility]   If 7PM-7AM, please contact night-coverage [email or web address]  09/13/2021, 9:22 PM

## 2021-09-14 LAB — CBC
HCT: 33.5 % — ABNORMAL LOW (ref 39.0–52.0)
Hemoglobin: 9.8 g/dL — ABNORMAL LOW (ref 13.0–17.0)
MCH: 29.8 pg (ref 26.0–34.0)
MCHC: 29.3 g/dL — ABNORMAL LOW (ref 30.0–36.0)
MCV: 101.8 fL — ABNORMAL HIGH (ref 80.0–100.0)
Platelets: 274 10*3/uL (ref 150–400)
RBC: 3.29 MIL/uL — ABNORMAL LOW (ref 4.22–5.81)
RDW: 19.7 % — ABNORMAL HIGH (ref 11.5–15.5)
WBC: 5.4 10*3/uL (ref 4.0–10.5)
nRBC: 0.9 % — ABNORMAL HIGH (ref 0.0–0.2)

## 2021-09-14 LAB — RENAL FUNCTION PANEL
Albumin: 1.9 g/dL — ABNORMAL LOW (ref 3.5–5.0)
Anion gap: 12 (ref 5–15)
BUN: 30 mg/dL — ABNORMAL HIGH (ref 8–23)
CO2: 27 mmol/L (ref 22–32)
Calcium: 7.1 mg/dL — ABNORMAL LOW (ref 8.9–10.3)
Chloride: 98 mmol/L (ref 98–111)
Creatinine, Ser: 5.21 mg/dL — ABNORMAL HIGH (ref 0.61–1.24)
GFR, Estimated: 11 mL/min — ABNORMAL LOW (ref >60)
Glucose, Bld: 86 mg/dL (ref 70–99)
Phosphorus: 4.5 mg/dL (ref 2.5–4.6)
Potassium: 4.4 mmol/L (ref 3.5–5.1)
Sodium: 137 mmol/L (ref 135–145)

## 2021-09-14 LAB — SURGICAL PATHOLOGY

## 2021-09-14 NOTE — Progress Notes (Signed)
Contacted by CSW on Friday afternoon regarding pt's decision to pursue snf. Navigator will assist with out-pt HD needs. Pt was originally a pt at [name] (TTS) but was re-clipped to [facility] in [place] due to transportation needs. If snf can transport pt to [place], navigator will assist obtaining appt at [facility]. If snf transports to another snf, navigator will assist with re-clipping pt to a clinic that snf transports to at d/c. Will assist as needed.   Melven Sartorius Renal Navigator [phone]

## 2021-09-14 NOTE — Care Management Important Message (Signed)
Important Message  Patient Details  Name: DELRICK DEHART MRN: [id number] Date of Birth: 1956/03/31   Medicare Important Message Given:  Yes     [name] 09/14/2021, 10:00 AM

## 2021-09-14 NOTE — Progress Notes (Signed)
Pt back from HD, pt assessed and denies pain or other needs.  Assisted with ordering lunch.  Morning meds administered.  He had some difficulty swallowing renvela, was able to get one down but refused the other two.  Also refused crushing as an alternative.

## 2021-09-14 NOTE — Progress Notes (Signed)
[facility] Progress Note   66 y.o. male PMH COPD Barrett's, hyperparathyroidism, ESRD, failed renal transplant, PAF on Eliquis p/w ischemic foot ulcer -> BKA on 12/30.  Patient receives his dialysis treatments at [place] with [facility].   Dialysis at [name] with [name] EDW 68.4kg? Per charting. EDW before the amputation was 72kg      Assessment/ Plan:   ESRD - TTS at [facility] w/ [facility] now MWF - establishing new EDW.  Last post weight was 66 kg but tachy.  68.4 kg charted as a possibly new EDW above.  -  Uses elisio dialyzer at [place] but has been tolerating our standard optiflux dialyzer here - HD today - getting back on MWF schedule  PAD with critical limb ischemia s/p rt BKA for gangrene - now s/p LBKA -  done 1/25.  Now status post right AKA on 09/09/2021  PAF - rate controlled and on Eliquis typically. On dilt and metop - cont even prior to dialysis.  Was having some RVR periodically during this admission and cardiology has been consulted - titrating BB and watching BP.  He's on midodrine now as well.   Anemia CKD - on ESA and s/p iron load.  Required transfusion this hosp.  Aranesp 100 mcg weekly on fridays.   Metabolic bone disease - phos at goal on renvela; calcium improves with correction for albumin however pause sensipar for now  GERD per primary team   COPD per primary team    9. Dispo: private transport to [place] HD unit from his [facility] home too costly, will switch from [name] to [facility]- MWF-  has spot.   Possibly going back to CIR or SNF    Subjective:    Seen and examined on dialysis.  Blood pressure 110/68 and HR 113.  LUE AVG in use. Procedure supervised. Tolerating goal of 1.5 kg.  He's not sure of final plan - they are still waiting on a bed   BP 111/79 (BP Location: Right Arm)    Pulse (!) 111    Temp 98.3 F (36.8 C) (Oral)    Resp 18    Ht 5\' 9"  (1.753 m)    Wt 67.1 kg    SpO2 100%    BMI 21.85 kg/m    Intake/Output Summary (Last 24 hours) at 09/14/2021 0816 Last data filed at 09/13/2021 1305 Gross per 24 hour  Intake 240 ml  Output 2 ml  Net 238 ml   Weight change: 1 kg  Physical Exam: GEN: Lying in bed, no distress HEENT: NCAT LUNGS: clear and unlabored on room air CV: Tachycardia, no edema ABD: SNDNT   EXT:R AKA new left BKA wrapped Neuro awake and conversant Psych -normal mood and affect Access: Left AVG in use  Imaging: No results found.  Labs: BMET Recent Labs  Lab 09/07/21 1430 09/10/21 0334 09/11/21 0135 09/12/21 0807 09/14/21 0619  NA 130* 132* 134* 136 137  K 4.7 5.6* 4.0 4.6 4.4  CL 91* 97* 97* 98 98  CO2 25 23 26 25 27   GLUCOSE 88 122* 110* 91 86  BUN 35* 44* 26* 38* 30*  CREATININE 5.33* 6.10* 3.96* 5.49* 5.21*  CALCIUM 7.8* 7.3* 7.6* 7.3* 7.1*  PHOS 4.4 6.4* 3.8 4.1 4.5   CBC Recent Labs  Lab 09/11/21 0135 09/12/21 0201 09/13/21 0207 09/14/21 0619  WBC 7.4 7.0 5.8 5.4  HGB 9.9* 9.6* 9.5* 9.8*  HCT 32.1* 32.1* 31.9* 33.5*  MCV 99.7  101.6* 101.3* 101.8*  PLT 270 256 274 274    Medications:     apixaban  5 mg Oral BID   atorvastatin  10 mg Oral QHS   Chlorhexidine Gluconate Cloth  6 each Topical Q0600   cinacalcet  60 mg Oral Q supper   [START ON 09/18/2021] Darbepoetin Alfa  100 mcg Intravenous Q Fri-HD   diltiazem  120 mg Oral Daily   docusate sodium  100 mg Oral TID   gabapentin  100 mg Oral BID   hydrocortisone  25 mg Rectal BID   metoprolol tartrate  37.5 mg Oral BID   midodrine  10 mg Oral TID WC   pantoprazole  40 mg Oral Daily   polyethylene glycol  17 g Oral Daily   senna  2 tablet Oral QHS   sertraline  100 mg Oral Daily   sevelamer carbonate  2,400 mg Oral TID WC    Claudia Desanctis, MD 8:41 AM 09/14/2021

## 2021-09-14 NOTE — Progress Notes (Addendum)
°  Progress Note    09/14/2021 7:51 AM 5 Days Post-Op  Subjective: Pain overall minimal. Says every day is better. Coping better now following his recent amputations   Vitals:   09/13/21 2310 09/14/21 0456  BP: 101/73 111/79  Pulse: (!) 111   Resp: 20 18  Temp: 97.9 F (36.6 C) 98.3 F (36.8 C)  SpO2: 99% 100%   Physical Exam: Cardiac: tachy Lungs:  non labored Incisions:  right AKA with Prevena Vac to suction. Left BKA dressings changed. A little oozing from staple line but otherwise viable skin edges and flaps. Kerlix and Sock applied Neurologic: alert and oriented  CBC    Component Value Date/Time   WBC 5.4 09/14/2021 0619   RBC 3.29 (L) 09/14/2021 0619   HGB 9.8 (L) 09/14/2021 0619   HGB 11.4 (L) 06/11/2012 0501   HCT 33.5 (L) 09/14/2021 0619   HCT 34.6 (L) 06/11/2012 0501   PLT 274 09/14/2021 0619   PLT 251 06/11/2012 0501   MCV 101.8 (H) 09/14/2021 0619   MCV 95 06/11/2012 0501   MCH 29.8 09/14/2021 0619   MCHC 29.3 (L) 09/14/2021 0619   RDW 19.7 (H) 09/14/2021 0619   RDW 14.4 06/11/2012 0501   LYMPHSABS 0.8 08/26/2021 1004   LYMPHSABS 1.2 06/11/2012 0501   MONOABS 0.9 08/26/2021 1004   MONOABS 0.7 06/11/2012 0501   EOSABS 0.0 08/26/2021 1004   EOSABS 0.2 06/11/2012 0501   BASOSABS 0.0 08/26/2021 1004   BASOSABS 0.1 06/11/2012 0501    BMET    Component Value Date/Time   NA 137 09/14/2021 0619   NA 142 06/10/2012 0924   K 4.4 09/14/2021 0619   K 4.5 06/16/2013 1315   CL 98 09/14/2021 0619   CL 102 06/10/2012 0924   CO2 27 09/14/2021 0619   CO2 29 06/10/2012 0924   GLUCOSE 86 09/14/2021 0619   GLUCOSE 83 06/10/2012 0924   BUN 30 (H) 09/14/2021 0619   BUN 35 (H) 06/10/2012 0924   CREATININE 5.21 (H) 09/14/2021 0619   CREATININE 10.29 (H) 06/10/2012 0924   CALCIUM 7.1 (L) 09/14/2021 0619   CALCIUM 9.2 06/10/2012 0924   GFRNONAA 11 (L) 09/14/2021 0619   GFRNONAA 5 (L) 06/10/2012 0924   GFRAA 9 (L) 09/18/2019 0430   GFRAA 6 (L) 06/10/2012 0924     INR    Component Value Date/Time   INR 1.7 (H) 08/05/2021 1846     Intake/Output Summary (Last 24 hours) at 09/14/2021 0751 Last data filed at 09/13/2021 1305 Gross per 24 hour  Intake 240 ml  Output 2 ml  Net 238 ml     Assessment/Plan:  66 y.o. male is s/p right AKA 5 Days Post-Op. Left BKA 12 days Post-Op.  Right AKA with Prevena incisional VAC. Will be removed 2/8 or prior to D/c Left BKA well appearing. Still some bloody drainage on dressings. Will continue to monitor. Stump sock applied Pain overall well controlled Continue Therapies Pending dispo to SNF     Karoline Caldwell, PA-C [facility] [phone] 09/14/2021 7:51 AM  VASCULAR STAFF ADDENDUM: I have independently interviewed and examined the patient. I agree with the above.   [name], MD [facility] Phone Number: [phone] 09/14/2021 9:33 AM

## 2021-09-14 NOTE — Progress Notes (Signed)
PT Cancellation Note  Patient Details Name: RAHMAN FERRALL MRN: [id number] DOB: 22-Oct-1955   Cancelled Treatment:    Reason Eval/Treat Not Completed: Patient at procedure or test/unavailable Pt off floor for HD. Will follow.  Marguarite Arbour A Rosalena Mccorry 09/14/2021, 8:25 AM Marisa Severin, PT, DPT Acute Rehabilitation Services Pager [phone] Office [phone]

## 2021-09-14 NOTE — Progress Notes (Signed)
PROGRESS NOTE    Scott Castro  GYJ:[id number] DOB: 08/02/1956 DOA: 08/30/2021 PCP: Franciso Bend, MD   Brief Narrative: 66 year old past medical history significant for ESRD on dialysis TTS, hypertension, hyperlipidemia, PAF, depression, chronic respiratory failure 2 L of oxygen, PAD recent right BKA on 08/07/2021 secondary to critical limb ischemia discharged to rehab.  He was seen by cardiology in inpatient rehab due to A. fib RVR and he was referred for inpatient telemetry admission.  Recent hospitalized at [facility] from 12/28 until 1//2023 right lower extremity critical limb ischemia status post BKA by vascular on 12/30.  Was just discharged to inpatient rehab here at [facility].  Found to be in RVR RVR admitted for further evaluation.  Patient  also had angiography 1/20 by Dr. [name] with BL diffuse infrapopliteal disease of the left lower extremity, vascular is recommended  left BKA. Patient underwent Left BKA 1/25.  Patient noted to be more hypoxic on 1/23, chest x-ray showed bilateral loculated pleural effusion, pulmonologist consulted for further evaluation. CT chest showed right loculated pleural effusion on right. Right lower lobe with irregular consolidation right lower lobe, he will need PET scan out patient. His oxygen requirement has remain stable. No safe window for thoracentesis. He will need CT chest out patient follow up   He develops anterior flap necrosis of right BKA> vascular planning AKA on 2/1. \ Plan is for CIR versus SNF when stable.     Assessment & Plan:   Principal Problem:   Critical limb ischemia of left lower extremity/PAD  Active Problems:   COPD (chronic obstructive pulmonary disease) (HCC)   Chronic respiratory failure with hypoxia (HCC)   ESRD (end stage renal disease) (HCC)   Barrett esophagus   Anemia   Mixed hyperlipidemia   S/P BKA (below knee amputation), right (HCC)   Depressive reaction   AF (paroxysmal atrial fibrillation) (HCC)  1-Critical  Limb ischemia of left lower extremity/PAD -Angiography 1/20 by Dr. [name] revealed diffuse infrapopliteal disease of the left with no revascularization options. -Recent BKA on the right 12/22 -Underwent  left BKA 1/25th -Tolerated procedure well.  -Dressing  removed 1/27. Unfortunately developed necrotic flap on right BKA and was transitioned to right AKA on 2/1 -Further postop care per vascular surgery  2-A. Fib Continue with  metoprolol and Cardizem.  Appreciate cardiology assistance.  On anticoagulation with Eliquis HR ranging between 100-115 Per cardiology notes, this is a reasonable range. Unless HR begins to sustain higher or patient becomes symptomatic, no further adjustment of meds at this time  3-Acute on chronic hypoxic respiratory failure Loculated bilateral moderate pleural effusion Moderate Pulmonary HTN;  COPD -Pulmonologist consulted. -Incentive Spirometry ordered. -Continue with nebulizer as needed -CT chest 1/23: Patchy nodularity and ground-glass in the dependent RIGHT upper lobe and bilateral lower lobes raising the question of pneumonitis with distribution raising the question of sequela of aspiration. More dense consolidative changes and irregular bronchial structures in the RIGHT lower lobe central to an area of rounded atelectasis is of uncertain significance. Given the more mass like appearance in this area and increasing size of mediastinal lymph nodes over time a possibility of neoplasm is considered. PET scan may be helpful on follow-up in addition to contrasted imaging with CT for further assessment.  Loculated and mildly nodular appearing pleural fluid in the RIGHT chest with increase and with decreased pleural fluid in the LEFT chest compared to prior imaging.  -IR consulted for thoracentesis.  There was no fluid pocket identified  that was safe to access by ultrasound thoracentesis. Stable on 2 L oxygen.  Needs follow up CT chest out patient.    4-ESRD on  hemodialysis -S/p renal transplant [place] with hemodialysis -Nephrology  following.   Barrett esophagus: Continue with PPI  Anemia:  anemia of chronic disease: IV iron per nephrology Hemoglobin currently stable  Hyperlipidemia: Continue with statins  Depression: Continue with Zoloft  Hyponatremia; fluid management with HD>   Severe tricuspid regurgitation, mild mitral regurgitation. Management per cardiology.     Disposition will be to CIR versus skilled nursing facility.  He is stable to discharge once disposition can be determined.  Estimated body mass index is 20.8 kg/m as calculated from the following:   Height as of this encounter: 5\' 9"  (1.753 m).   Weight as of this encounter: 63.9 kg.   DVT prophylaxis: eliquis.  Code Status: DNR Family Communication: care discussed with patient.  Disposition Plan:  Status is: Inpatient  Remains inpatient appropriate because: management of critical limb ischemia.      Consultants:  Nephrology Cardiology Vascular.  Pulmonologist  Palliative care  Procedures:  Left BKA 1/25 Right AKA 2/1  Antimicrobials:    Subjective: He does not have any new complaints.  No chest pain or shortness of breath.  Objective: Vitals:   09/14/21 1456 09/14/21 1640 09/14/21 1842 09/14/21 1955  BP: 107/81 118/82 106/81 115/83  Pulse:    (!) 115  Resp: 19 18 19 18   Temp: 98.5 F (36.9 C) 98.4 F (36.9 C) 98.5 F (36.9 C) 98.4 F (36.9 C)  TempSrc: Oral Oral Oral Oral  SpO2: 100% 100% 97% 98%  Weight:      Height:        Intake/Output Summary (Last 24 hours) at 09/14/2021 2130 Last data filed at 09/14/2021 1956 Gross per 24 hour  Intake 120 ml  Output 1000 ml  Net -880 ml   Filed Weights   09/14/21 0500 09/14/21 0751 09/14/21 1145  Weight: 67.1 kg 64.3 kg 63.9 kg    Examination:  General exam: Chronic ill appearing Respiratory system: Clear bilaterally Cardiovascular system: S 1, S 2 irregular Systolic  murmur Gastrointestinal system: BS present, soft, nt Central nervous system: Alert follows command Extremities: R AKA , left BKA     Data Reviewed: I have personally reviewed following labs and imaging studies  CBC: Recent Labs  Lab 09/10/21 0334 09/11/21 0135 09/12/21 0201 09/13/21 0207 09/14/21 0619  WBC 8.7 7.4 7.0 5.8 5.4  HGB 9.4* 9.9* 9.6* 9.5* 9.8*  HCT 30.3* 32.1* 32.1* 31.9* 33.5*  MCV 99.0 99.7 101.6* 101.3* 101.8*  PLT 248 270 256 274 244   Basic Metabolic Panel: Recent Labs  Lab 09/10/21 0334 09/11/21 0135 09/12/21 0807 09/14/21 0619  NA 132* 134* 136 137  K 5.6* 4.0 4.6 4.4  CL 97* 97* 98 98  CO2 23 26 25 27   GLUCOSE 122* 110* 91 86  BUN 44* 26* 38* 30*  CREATININE 6.10* 3.96* 5.49* 5.21*  CALCIUM 7.3* 7.6* 7.3* 7.1*  PHOS 6.4* 3.8 4.1 4.5   GFR: Estimated Creatinine Clearance: 12.6 mL/min (A) (by C-G formula based on SCr of 5.21 mg/dL (H)). Liver Function Tests: Recent Labs  Lab 09/10/21 0334 09/11/21 0135 09/12/21 0807 09/14/21 0619  ALBUMIN 1.8* 1.9* 1.9* 1.9*   No results for input(s): LIPASE, AMYLASE in the last 168 hours. No results for input(s): AMMONIA in the last 168 hours. Coagulation Profile: No results for input(s): INR, PROTIME in the last  168 hours. Cardiac Enzymes: No results for input(s): CKTOTAL, CKMB, CKMBINDEX, TROPONINI in the last 168 hours. BNP (last 3 results) No results for input(s): PROBNP in the last 8760 hours. HbA1C: No results for input(s): HGBA1C in the last 72 hours. CBG: No results for input(s): GLUCAP in the last 168 hours.  Lipid Profile: No results for input(s): CHOL, HDL, LDLCALC, TRIG, CHOLHDL, LDLDIRECT in the last 72 hours.  Thyroid Function Tests: No results for input(s): TSH, T4TOTAL, FREET4, T3FREE, THYROIDAB in the last 72 hours. Anemia Panel: No results for input(s): VITAMINB12, FOLATE, FERRITIN, TIBC, IRON, RETICCTPCT in the last 72 hours. Sepsis Labs: No results for input(s):  PROCALCITON, LATICACIDVEN in the last 168 hours.  No results found for this or any previous visit (from the past 240 hour(s)).       Radiology Studies: No results found.      Scheduled Meds:  apixaban  5 mg Oral BID   atorvastatin  10 mg Oral QHS   Chlorhexidine Gluconate Cloth  6 each Topical Q0600   [START ON 09/18/2021] Darbepoetin Alfa  100 mcg Intravenous Q Fri-HD   diltiazem  120 mg Oral Daily   docusate sodium  100 mg Oral TID   gabapentin  100 mg Oral BID   hydrocortisone  25 mg Rectal BID   metoprolol tartrate  37.5 mg Oral BID   midodrine  10 mg Oral TID WC   pantoprazole  40 mg Oral Daily   polyethylene glycol  17 g Oral Daily   senna  2 tablet Oral QHS   sertraline  100 mg Oral Daily   sevelamer carbonate  2,400 mg Oral TID WC   Continuous Infusions:  sodium chloride 250 mL (09/07/21 1041)   ferric gluconate (FERRLECIT) IVPB Stopped (09/14/21 1106)     LOS: 15 days    Time spent: 35 Minutes.     Kathie Dike, MD [facility]   If 7PM-7AM, please contact night-coverage [email or web address]  09/14/2021, 9:30 PM

## 2021-09-15 DIAGNOSIS — E43 Unspecified severe protein-calorie malnutrition: Secondary | ICD-10-CM | POA: Insufficient documentation

## 2021-09-15 LAB — RESP PANEL BY RT-PCR (FLU A&B, COVID) ARPGX2
Influenza A by PCR: NEGATIVE
Influenza B by PCR: NEGATIVE
SARS Coronavirus 2 by RT PCR: NEGATIVE

## 2021-09-15 LAB — CBC
HCT: 32.1 % — ABNORMAL LOW (ref 39.0–52.0)
Hemoglobin: 9.7 g/dL — ABNORMAL LOW (ref 13.0–17.0)
MCH: 30.4 pg (ref 26.0–34.0)
MCHC: 30.2 g/dL (ref 30.0–36.0)
MCV: 100.6 fL — ABNORMAL HIGH (ref 80.0–100.0)
Platelets: 220 10*3/uL (ref 150–400)
RBC: 3.19 MIL/uL — ABNORMAL LOW (ref 4.22–5.81)
RDW: 19.4 % — ABNORMAL HIGH (ref 11.5–15.5)
WBC: 5.2 10*3/uL (ref 4.0–10.5)
nRBC: 0.6 % — ABNORMAL HIGH (ref 0.0–0.2)

## 2021-09-15 MED ORDER — NEPRO/CARBSTEADY PO LIQD
237.0000 mL | Freq: Three times a day (TID) | ORAL | Status: DC
  Administered 2021-09-15 – 2021-09-16 (×3): 237 mL via ORAL

## 2021-09-15 MED ORDER — CHLORHEXIDINE GLUCONATE CLOTH 2 % EX PADS
6.0000 | MEDICATED_PAD | Freq: Every day | CUTANEOUS | Status: DC
  Administered 2021-09-16: 6 via TOPICAL

## 2021-09-15 MED ORDER — RENA-VITE PO TABS
1.0000 | ORAL_TABLET | Freq: Every day | ORAL | Status: DC
  Administered 2021-09-15: 1 via ORAL
  Filled 2021-09-15: qty 1

## 2021-09-15 NOTE — Progress Notes (Signed)
Occupational Therapy Treatment Patient Details Name: Scott Castro MRN: [id number] DOB: 07-08-1956 Today's Date: 09/15/2021   History of present illness 66 yo male presenting on 1/20 from [facility] with bilateral diffuse infrapopliteal disease of the left lower extremity. S/p L BKA 09/02/21. Recent hospitalization at [facility] (08/05/21-08/12/21) for R BKA on 12/30 with d/c to [facility]. s/p right AKA 09/09/21. PMH including  ESRD on dialysis TTS, hypertension, hyperlipidemia, PAF, depression, and chronic respiratory failure 2 L of oxygen.   OT comments  Pt progressing towards established OT goals. Pt very happy to have diet change and have plans for rehab at dc. Pt donning sweat pants with Min A for maintaining sidelying position while pulling pants over hips. Pt then performing lateral scoot to recliner with Min Guard A +2, cues for sequencing, and sliding board. Continue to recommend dc to Snf and will continue to follow acutely as admitted.    Recommendations for follow up therapy are one component of a multi-disciplinary discharge planning process, led by the attending physician.  Recommendations may be updated based on patient status, additional functional criteria and insurance authorization.    Follow Up Recommendations  Skilled nursing-short term rehab (<3 hours/day)    Assistance Recommended at Discharge Frequent or constant Supervision/Assistance  Patient can return home with the following  Assist for transportation;Help with stairs or ramp for entrance;Assistance with cooking/housework;Two people to help with walking and/or transfers;A lot of help with bathing/dressing/bathroom   Equipment Recommendations  BSC/3in1;Wheelchair (measurements OT);Wheelchair cushion (measurements OT);Tub/shower bench    Recommendations for Other Services      Precautions / Restrictions Precautions Precautions: Fall;Other (comment) Precaution Comments: wound vac Restrictions Weight Bearing Restrictions: Yes RLE Weight  Bearing: Non weight bearing LLE Weight Bearing: Non weight bearing       Mobility Bed Mobility Overal bed mobility: Needs Assistance Bed Mobility: Rolling, Supine to Sit Rolling: Min assist   Supine to sit: Min assist, +2 for safety/equipment     General bed mobility comments: Min A for bringing hips over during roll. Min A for elevating trunk - pt holding therapists hand to pull up    Transfers Overall transfer level: Needs assistance Equipment used: Sliding board Transfers: Bed to chair/wheelchair/BSC            Lateral/Scoot Transfers: Min guard, +2 safety/equipment, With slide board General transfer comment: Cues for hand placement. Pt able to manage sliding board (both placement and then removing). Min Guard A for safety     Balance Overall balance assessment: Needs assistance Sitting-balance support: No upper extremity supported, Feet supported Sitting balance-Leahy Scale: Fair                                     ADL either performed or assessed with clinical judgement   ADL Overall ADL's : Needs assistance/impaired                     Lower Body Dressing: Minimal assistance;Bed level Lower Body Dressing Details (indicate cue type and reason): Cues for threading LLE first. assistance for wound vac management. Min A for bringing hips over during roll and then maintain sidelying to reach back and pull up pants over hips. Toilet Transfer: Min guard;+2 for physical assistance;Requires drop arm;Cueing for sequencing;Cueing for safety (sliding) Toilet Transfer Details (indicate cue type and reason): Cues for hand placement. Pt able to manage sliding board (both placement and then removing). Min  Guard A for safety           General ADL Comments: Pt donning pants, performing lateral scoot to recliner, and then exercises.    Extremity/Trunk Assessment Upper Extremity Assessment Upper Extremity Assessment: Generalized weakness   Lower  Extremity Assessment Lower Extremity Assessment: Defer to PT evaluation        Vision       Perception     Praxis      Cognition Arousal/Alertness: Awake/alert Behavior During Therapy: WFL for tasks assessed/performed Overall Cognitive Status: Within Functional Limits for tasks assessed                                 General Comments: Following cues. Slightly anxious wtih reaching exercises.        Exercises Exercises: Other exercises Other Exercises Other Exercises: chair push ups; 10x for 2 sets Other Exercises: Reaching task to address sitting balance; BUE across body    Shoulder Instructions       General Comments HR 110s. SpO2 stable on RA    Pertinent Vitals/ Pain       Pain Assessment Pain Assessment: Faces Faces Pain Scale: Hurts a little bit Pain Location: R residual limb Pain Descriptors / Indicators: Sore, Operative site guarding Pain Intervention(s): Monitored during session, Limited activity within patient's tolerance, Repositioned  Home Living                                          Prior Functioning/Environment              Frequency  Min 2X/week        Progress Toward Goals  OT Goals(current goals can now be found in the care plan section)  Progress towards OT goals: Not progressing toward goals - comment  Acute Rehab OT Goals OT Goal Formulation: With patient Time For Goal Achievement: 09/22/21 Potential to Achieve Goals: Good ADL Goals Pt Will Perform Lower Body Dressing: with min guard assist;sitting/lateral leans Pt Will Transfer to Toilet: with min assist;anterior/posterior transfer;bedside commode;with transfer board Pt Will Perform Toileting - Clothing Manipulation and hygiene: with min assist;sitting/lateral leans Pt Will Perform Tub/Shower Transfer: Tub transfer;tub bench;with supervision Additional ADL Goal #1: Pt will maintain dynamic sitting balance unsupported with supervision as  precursor to ADLs.  Plan Discharge plan remains appropriate    Co-evaluation    PT/OT/SLP Co-Evaluation/Treatment: Yes Reason for Co-Treatment: To address functional/ADL transfers;For patient/therapist safety;Other (comment) (pain management)   OT goals addressed during session: ADL's and self-care      AM-PAC OT "6 Clicks" Daily Activity     Outcome Measure   Help from another person eating meals?: A Little Help from another person taking care of personal grooming?: A Little Help from another person toileting, which includes using toliet, bedpan, or urinal?: A Lot Help from another person bathing (including washing, rinsing, drying)?: A Lot Help from another person to put on and taking off regular upper body clothing?: A Little Help from another person to put on and taking off regular lower body clothing?: A Little 6 Click Score: 16    End of Session Equipment Utilized During Treatment: Other (comment) (Sliding board)  OT Visit Diagnosis: Unsteadiness on feet (R26.81);Other abnormalities of gait and mobility (R26.89) Pain - Right/Left: Right Pain - part of body: Leg   Activity Tolerance  Patient tolerated treatment well   Patient Left in chair;with call bell/phone within reach;with family/visitor present   Nurse Communication Mobility status        Time: 4784-1282 OT Time Calculation (min): 30 min  Charges: OT General Charges $OT Visit: 1 Visit OT Treatments $Self Care/Home Management : 8-22 mins  [place], OTR/L Acute Rehab Pager: [phone] Office: [place] 09/15/2021, 11:44 AM

## 2021-09-15 NOTE — TOC Progression Note (Signed)
Transition of Care [facility]) - Progression Note    Patient Details  Name: Scott Castro MRN: [id number] Date of Birth: 1955/11/11  Transition of Care [facility]) CM/SW [place], [place] Phone Number: 09/15/2021, 12:22 PM  Clinical Narrative:     [name] has confirmed bed offer - informed Renal Nav/Tracy  Expected Discharge Plan: [place] Barriers to Discharge: Continued Medical Work up, SNF Pending bed offer  Expected Discharge Plan and Services Expected Discharge Plan: [place]                                               Social Determinants of Health (SDOH) Interventions    Readmission Risk Interventions Readmission Risk Prevention Plan 07/22/2021 04/10/2019  Transportation Screening Complete Complete  PCP or Specialist Appt within 3-5 Days Complete Complete  HRI or [place] Complete Complete  Social Work Consult for [place] Planning/Counseling Complete Complete  Palliative Care Screening Not Applicable -  Medication Review Press photographer) Complete Complete  Some recent data might be hidden

## 2021-09-15 NOTE — TOC Progression Note (Signed)
Transition of Care [facility]) - Progression Note    Patient Details  Name: [name] MRN: [id number] Date of Birth: [birth year]  Transition of Care [facility]) CM/SW [place], [place] Phone Number: 09/15/2021, 8:38 AM  Clinical Narrative:     CSW met with patient at bedside, provided bed offers. Patient decided on [place].   CSW sent message to [facility] to confirm bed offer- waiting on confirmation  Thurmond Butts, MSW, LCSW Clinical Social Worker    Expected Discharge Plan: Skilled Nursing Facility Barriers to Discharge: Continued Medical Work up, SNF Pending bed offer  Expected Discharge Plan and Services Expected Discharge Plan: [place]                                               Social Determinants of Health (SDOH) Interventions    Readmission Risk Interventions Readmission Risk Prevention Plan 07/22/2021 04/10/2019  Transportation Screening Complete Complete  PCP or Specialist Appt within 3-5 Days Complete Complete  HRI or [place] Complete Complete  Social Work Consult for [place] Planning/Counseling Complete Complete  Palliative Care Screening Not Applicable -  Medication Review Press photographer) Complete Complete  Some recent data might be hidden

## 2021-09-15 NOTE — Progress Notes (Signed)
[facility] Progress Note   66 y.o. male PMH COPD Barrett's, hyperparathyroidism, ESRD, failed renal transplant, PAF on Eliquis p/w ischemic foot ulcer -> BKA on 12/30.  Patient receives his dialysis treatments at [place] with [facility].   Dialysis at [facility] EDW 68.4kg? Per charting. EDW before the amputation was 72kg      Assessment/ Plan:   ESRD - TTS at [facility]/ [facility] now MWF - HD per MWF schedule - renal panel in am - establishing new EDW.  Potentially 65-66 kg. He has been lower than that but tachy (nadir weight 63.5kg 09/15/21 - concern for accuracy) -  Uses elisio dialyzer at [place] but has been tolerating our standard optiflux dialyzer here  PAD with critical limb ischemia s/p rt BKA for gangrene - now s/p LBKA -  done 1/25.  Now status post right AKA on 09/09/2021  PAF - rate controlled and on Eliquis typically. On dilt and metop - cont even prior to dialysis.  Was having some RVR periodically during this admission and cardiology has been consulted - titrating BB and watching BP.  He's on midodrine now as well.   Anemia CKD - on ESA and s/p iron load.  Required transfusion this hosp.  Has been on Aranesp 100 mcg weekly on fridays.   Metabolic bone disease - phos at goal on renvela; calcium improves with correction for albumin however paused sensipar for now  GERD per primary team   COPD per primary team    9. Dispo: disposition per primary team.  private transport to [place] HD unit from his [place] too costly, he was switched from [name] to [facility] MWF.   Possibly going back to CIR or SNF.  Depending on final dispo he may need an alternate unit. HD SW is aware and following.     Subjective:    Last HD on 2/6 with 1kg UF.  No labs today.  No UOP charted.  States that he's heard about an offer from a SNF.  He's trying to move forward and adjust to life after his most recent amputation.   Review of  systems:  Denies n/v Denies chest pain  Denies shortness of breath     BP 121/89 (BP Location: Right Arm)    Pulse (!) 112    Temp 98.1 F (36.7 C) (Oral)    Resp 17    Ht 5\' 9"  (1.753 m)    Wt 63.5 kg    SpO2 100%    BMI 20.67 kg/m   Intake/Output Summary (Last 24 hours) at 09/15/2021 6440 Last data filed at 09/15/2021 0522 Gross per 24 hour  Intake 120 ml  Output 1000 ml  Net -880 ml   Weight change: -2.8 kg  Physical Exam:  GEN: Lying in bed, no distress HEENT: NCAT LUNGS: clear and unlabored on room air CV: Tachycardia, no edema appreciated residual limbs ABD: SNDNT   EXT:R AKA new left BKA wrapped Neuro awake and conversant Psych -normal mood and affect Access: Left AVG with bruit   Imaging: No results found.  Labs: BMET Recent Labs  Lab 09/10/21 0334 09/11/21 0135 09/12/21 0807 09/14/21 0619  NA 132* 134* 136 137  K 5.6* 4.0 4.6 4.4  CL 97* 97* 98 98  CO2 23 26 25 27   GLUCOSE 122* 110* 91 86  BUN 44* 26* 38* 30*  CREATININE 6.10* 3.96* 5.49* 5.21*  CALCIUM 7.3* 7.6* 7.3* 7.1*  PHOS 6.4*  3.8 4.1 4.5   CBC Recent Labs  Lab 09/12/21 0201 09/13/21 0207 09/14/21 0619 09/15/21 0043  WBC 7.0 5.8 5.4 5.2  HGB 9.6* 9.5* 9.8* 9.7*  HCT 32.1* 31.9* 33.5* 32.1*  MCV 101.6* 101.3* 101.8* 100.6*  PLT 256 274 274 220    Medications:     apixaban  5 mg Oral BID   atorvastatin  10 mg Oral QHS   Chlorhexidine Gluconate Cloth  6 each Topical Q0600   [START ON 09/18/2021] Darbepoetin Alfa  100 mcg Intravenous Q Fri-HD   diltiazem  120 mg Oral Daily   docusate sodium  100 mg Oral TID   gabapentin  100 mg Oral BID   hydrocortisone  25 mg Rectal BID   metoprolol tartrate  37.5 mg Oral BID   midodrine  10 mg Oral TID WC   pantoprazole  40 mg Oral Daily   polyethylene glycol  17 g Oral Daily   senna  2 tablet Oral QHS   sertraline  100 mg Oral Daily   sevelamer carbonate  2,400 mg Oral TID WC    Claudia Desanctis, MD 09/15/2021 9:40 AM

## 2021-09-15 NOTE — Progress Notes (Signed)
Contacted [place] to discuss schedule for pt at d/c. Clinic reports that they may not have a MWF available. Message left for clinic manager, Raquel Sarna, to discuss pt's needs further. Will await a return call. Update provided to CSW.  Melven Sartorius Renal Navigator [phone]

## 2021-09-15 NOTE — Progress Notes (Signed)
PROGRESS NOTE    Scott Castro  ZOX:[id number] DOB: 1955/12/04 DOA: 08/30/2021 PCP: Franciso Bend, MD   Brief Narrative: 66 year old past medical history significant for ESRD on dialysis TTS, hypertension, hyperlipidemia, PAF, depression, chronic respiratory failure 2 L of oxygen, PAD recent right BKA on 08/07/2021 secondary to critical limb ischemia discharged to rehab.  He was seen by cardiology in inpatient rehab due to A. fib RVR and he was referred for inpatient telemetry admission.  Recent hospitalized at [facility] from 12/28 until 1//2023 right lower extremity critical limb ischemia status post BKA by vascular on 12/30.  Was just discharged to inpatient rehab here at [facility].  Found to be in RVR RVR admitted for further evaluation.  Patient  also had angiography 1/20 by Dr. [name] with BL diffuse infrapopliteal disease of the left lower extremity, vascular is recommended  left BKA. Patient underwent Left BKA 1/25.  Patient noted to be more hypoxic on 1/23, chest x-ray showed bilateral loculated pleural effusion, pulmonologist consulted for further evaluation. CT chest showed right loculated pleural effusion on right. Right lower lobe with irregular consolidation right lower lobe, he will need PET scan out patient. His oxygen requirement has remain stable. No safe window for thoracentesis. He will need CT chest out patient follow up   He develops anterior flap necrosis of right BKA> vascular performed AKA on 2/1  Plan is for SNF when bed is available    Assessment & Plan:   Principal Problem:   Critical limb ischemia of left lower extremity/PAD  Active Problems:   COPD (chronic obstructive pulmonary disease) (HCC)   Chronic respiratory failure with hypoxia (HCC)   ESRD (end stage renal disease) (HCC)   Barrett esophagus   Anemia   Mixed hyperlipidemia   S/P BKA (below knee amputation), right (HCC)   Depressive reaction   AF (paroxysmal atrial fibrillation) (HCC)    Protein-calorie malnutrition, severe  1-Critical Limb ischemia of left lower extremity/PAD -Angiography 1/20 by Dr. [name] revealed diffuse infrapopliteal disease of the left with no revascularization options. -Recent BKA on the right 12/22 -Underwent  left BKA 1/25th -Tolerated procedure well.  -Dressing  removed 1/27. Unfortunately developed necrotic flap on right BKA and was transitioned to right AKA on 2/1 -Further postop care per vascular surgery  2-A. Fib Continue with  metoprolol and Cardizem.  Appreciate cardiology assistance.  On anticoagulation with Eliquis HR ranging between 100-115 Per cardiology notes, this is a reasonable range. Unless HR begins to sustain higher or patient becomes symptomatic, no further adjustment of meds at this time  3-Acute on chronic hypoxic respiratory failure Loculated bilateral moderate pleural effusion Moderate Pulmonary HTN;  COPD -Pulmonologist consulted. -Incentive Spirometry ordered. -Continue with nebulizer as needed -CT chest 1/23: Patchy nodularity and ground-glass in the dependent RIGHT upper lobe and bilateral lower lobes raising the question of pneumonitis with distribution raising the question of sequela of aspiration. More dense consolidative changes and irregular bronchial structures in the RIGHT lower lobe central to an area of rounded atelectasis is of uncertain significance. Given the more mass like appearance in this area and increasing size of mediastinal lymph nodes over time a possibility of neoplasm is considered. PET scan may be helpful on follow-up in addition to contrasted imaging with CT for further assessment.  Loculated and mildly nodular appearing pleural fluid in the RIGHT chest with increase and with decreased pleural fluid in the LEFT chest compared to prior imaging.  -IR consulted for thoracentesis.  There was  no fluid pocket identified that was safe to access by ultrasound thoracentesis. Stable on RA Needs follow  up CT chest out patient.    4-ESRD on hemodialysis -S/p renal transplant [place] with hemodialysis -Nephrology  following.   Barrett esophagus: Continue with PPI  Anemia:  anemia of chronic disease: IV iron per nephrology Hemoglobin currently stable  Hyperlipidemia: Continue with statins  Depression: Continue with Zoloft  Hyponatremia; fluid management with HD>   Severe tricuspid regurgitation, mild mitral regurgitation. Management per cardiology.   Goals of care -Seen by palliative care -Patient is DNR    Disposition will be to skilled nursing facility.  He is stable to discharge once appropriate arrangements have been made  Estimated body mass index is 20.67 kg/m as calculated from the following:   Height as of this encounter: 5\' 9"  (1.753 m).   Weight as of this encounter: 63.5 kg.   DVT prophylaxis: eliquis.  Code Status: DNR Family Communication: care discussed with patient.  Disposition Plan:  Status is: Inpatient  Remains inpatient appropriate because: management of critical limb ischemia.      Consultants:  Nephrology Cardiology Vascular.  Pulmonologist  Palliative care  Procedures:  Left BKA 1/25 Right AKA 2/1  Antimicrobials:    Subjective: sleeping on my arrival, wakes up to voice, does not have any complaints today.  Objective: Vitals:   09/15/21 0518 09/15/21 0715 09/15/21 1933 09/15/21 2004  BP: 115/79 121/89 99/74   Pulse: (!) 112 (!) 112 (!) 110   Resp: 18 17 17 20   Temp: 98.1 F (36.7 C) 98.1 F (36.7 C) 97.8 F (36.6 C)   TempSrc: Oral Oral Oral   SpO2: 95% 100% 98%   Weight: 63.5 kg     Height:        Intake/Output Summary (Last 24 hours) at 09/15/2021 2157 Last data filed at 09/15/2021 0522 Gross per 24 hour  Intake 0 ml  Output 0 ml  Net 0 ml   Filed Weights   09/14/21 0751 09/14/21 1145 09/15/21 0518  Weight: 64.3 kg 63.9 kg 63.5 kg    Examination:  General exam: Chronic ill appearing Respiratory  system: Clear bilaterally Cardiovascular system: S 1, S 2 irregular Systolic murmur Gastrointestinal system: BS present, soft, nt Central nervous system: Alert follows command Extremities: R AKA , left BKA     Data Reviewed: I have personally reviewed following labs and imaging studies  CBC: Recent Labs  Lab 09/11/21 0135 09/12/21 0201 09/13/21 0207 09/14/21 0619 09/15/21 0043  WBC 7.4 7.0 5.8 5.4 5.2  HGB 9.9* 9.6* 9.5* 9.8* 9.7*  HCT 32.1* 32.1* 31.9* 33.5* 32.1*  MCV 99.7 101.6* 101.3* 101.8* 100.6*  PLT 270 256 274 274 325   Basic Metabolic Panel: Recent Labs  Lab 09/10/21 0334 09/11/21 0135 09/12/21 0807 09/14/21 0619  NA 132* 134* 136 137  K 5.6* 4.0 4.6 4.4  CL 97* 97* 98 98  CO2 23 26 25 27   GLUCOSE 122* 110* 91 86  BUN 44* 26* 38* 30*  CREATININE 6.10* 3.96* 5.49* 5.21*  CALCIUM 7.3* 7.6* 7.3* 7.1*  PHOS 6.4* 3.8 4.1 4.5   GFR: Estimated Creatinine Clearance: 12.5 mL/min (A) (by C-G formula based on SCr of 5.21 mg/dL (H)). Liver Function Tests: Recent Labs  Lab 09/10/21 0334 09/11/21 0135 09/12/21 0807 09/14/21 0619  ALBUMIN 1.8* 1.9* 1.9* 1.9*   No results for input(s): LIPASE, AMYLASE in the last 168 hours. No results for input(s): AMMONIA in the last 168 hours. Coagulation  Profile: No results for input(s): INR, PROTIME in the last 168 hours. Cardiac Enzymes: No results for input(s): CKTOTAL, CKMB, CKMBINDEX, TROPONINI in the last 168 hours. BNP (last 3 results) No results for input(s): PROBNP in the last 8760 hours. HbA1C: No results for input(s): HGBA1C in the last 72 hours. CBG: No results for input(s): GLUCAP in the last 168 hours.  Lipid Profile: No results for input(s): CHOL, HDL, LDLCALC, TRIG, CHOLHDL, LDLDIRECT in the last 72 hours.  Thyroid Function Tests: No results for input(s): TSH, T4TOTAL, FREET4, T3FREE, THYROIDAB in the last 72 hours. Anemia Panel: No results for input(s): VITAMINB12, FOLATE, FERRITIN, TIBC, IRON,  RETICCTPCT in the last 72 hours. Sepsis Labs: No results for input(s): PROCALCITON, LATICACIDVEN in the last 168 hours.  Recent Results (from the past 240 hour(s))  Resp Panel by RT-PCR (Flu A&B, Covid) Nasopharyngeal Swab     Status: None   Collection Time: 09/15/21  1:49 PM   Specimen: Nasopharyngeal Swab; Nasopharyngeal(NP) swabs in vial transport medium  Result Value Ref Range Status   SARS Coronavirus 2 by RT PCR NEGATIVE NEGATIVE Final    Comment: (NOTE) SARS-CoV-2 target nucleic acids are NOT DETECTED.  The SARS-CoV-2 RNA is generally detectable in upper respiratory specimens during the acute phase of infection. The lowest concentration of SARS-CoV-2 viral copies this assay can detect is 138 copies/mL. A negative result does not preclude SARS-Cov-2 infection and should not be used as the sole basis for treatment or other patient management decisions. A negative result may occur with  improper specimen collection/handling, submission of specimen other than nasopharyngeal swab, presence of viral mutation(s) within the areas targeted by this assay, and inadequate number of viral copies(<138 copies/mL). A negative result must be combined with clinical observations, patient history, and epidemiological information. The expected result is Negative.  Fact Sheet for Patients:  EntrepreneurPulse.com.au  Fact Sheet for Healthcare Providers:  IncredibleEmployment.be  This test is no t yet approved or cleared by the [place] FDA and  has been authorized for detection and/or diagnosis of SARS-CoV-2 by FDA under an Emergency Use Authorization (EUA). This EUA will remain  in effect (meaning this test can be used) for the duration of the COVID-19 declaration under Section 564(b)(1) of the Act, 21 U.S.C.section 360bbb-3(b)(1), unless the authorization is terminated  or revoked sooner.       Influenza A by PCR NEGATIVE NEGATIVE Final   Influenza  B by PCR NEGATIVE NEGATIVE Final    Comment: (NOTE) The Xpert Xpress SARS-CoV-2/FLU/RSV plus assay is intended as an aid in the diagnosis of influenza from Nasopharyngeal swab specimens and should not be used as a sole basis for treatment. Nasal washings and aspirates are unacceptable for Xpert Xpress SARS-CoV-2/FLU/RSV testing.  Fact Sheet for Patients: EntrepreneurPulse.com.au  Fact Sheet for Healthcare Providers: IncredibleEmployment.be  This test is not yet approved or cleared by the [place] FDA and has been authorized for detection and/or diagnosis of SARS-CoV-2 by FDA under an Emergency Use Authorization (EUA). This EUA will remain in effect (meaning this test can be used) for the duration of the COVID-19 declaration under Section 564(b)(1) of the Act, 21 U.S.C. section 360bbb-3(b)(1), unless the authorization is terminated or revoked.  Performed at [facility], [place] [address] [place], [place] [address]          Radiology Studies: No results found.      Scheduled Meds:  apixaban  5 mg Oral BID   atorvastatin  10 mg Oral QHS  Chlorhexidine Gluconate Cloth  6 each Topical Q0600   [START ON 09/16/2021] Chlorhexidine Gluconate Cloth  6 each Topical Q0600   [START ON 09/18/2021] Darbepoetin Alfa  100 mcg Intravenous Q Fri-HD   diltiazem  120 mg Oral Daily   docusate sodium  100 mg Oral TID   feeding supplement (NEPRO CARB STEADY)  237 mL Oral TID BM   gabapentin  100 mg Oral BID   hydrocortisone  25 mg Rectal BID   metoprolol tartrate  37.5 mg Oral BID   midodrine  10 mg Oral TID WC   multivitamin  1 tablet Oral QHS   pantoprazole  40 mg Oral Daily   polyethylene glycol  17 g Oral Daily   senna  2 tablet Oral QHS   sertraline  100 mg Oral Daily   sevelamer carbonate  2,400 mg Oral TID WC   Continuous Infusions:  sodium chloride 250 mL (09/07/21 1041)   ferric gluconate (FERRLECIT) IVPB Stopped (09/14/21 1106)      LOS: 16 days    Time spent: 35 Minutes.     Kathie Dike, MD [facility]   If 7PM-7AM, please contact night-coverage [email or web address]  09/15/2021, 9:57 PM

## 2021-09-15 NOTE — Progress Notes (Signed)
Physical Therapy Treatment Patient Details Name: Scott Castro MRN: [id number] DOB: October 30, 1955 Today's Date: 09/15/2021   History of Present Illness 66 yo male presenting on 1/20 from [facility] with bilateral diffuse infrapopliteal disease of the left lower extremity. S/p L BKA 09/02/21. Recent hospitalization at [facility] (08/05/21-08/12/21) for R BKA on 12/30 with d/c to AIR. s/p right AKA 09/09/21. PMH including  ESRD on dialysis TTS, hypertension, hyperlipidemia, PAF, depression, and chronic respiratory failure 2 L of oxygen.    PT Comments    Patient in good spirits today and progressing well with mobility. Requires Min A for bed mobility with a few instances of Mod A due to LOB posteriorly with scooting forward to get to EOB and when initiating transfer. Tolerated lateral scoot transfer with slide board to chair with Min guard assist of 2 for safety and cues needed for technique. Noted to have some instability at trunk. Tolerated there ex sitting in chair. Pt lacking full knee extension on left, instructed in positioning. Appropriate for SNF. Will follow.    Recommendations for follow up therapy are one component of a multi-disciplinary discharge planning process, led by the attending physician.  Recommendations may be updated based on patient status, additional functional criteria and insurance authorization.  Follow Up Recommendations  Skilled nursing-short term rehab (<3 hours/day)     Assistance Recommended at Discharge Frequent or constant Supervision/Assistance  Patient can return home with the following A little help with walking and/or transfers;A little help with bathing/dressing/bathroom;Assist for transportation;Assistance with Education officer, environmental (measurements PT);Wheelchair cushion (measurements PT)    Recommendations for Other Services       Precautions / Restrictions Precautions Precautions: Fall;Other (comment) Precaution Comments: wound  vac Restrictions Weight Bearing Restrictions: Yes RLE Weight Bearing: Non weight bearing LLE Weight Bearing: Non weight bearing Other Position/Activity Restrictions: right AKA, left BKA     Mobility  Bed Mobility Overal bed mobility: Needs Assistance Bed Mobility: Rolling, Supine to Sit Rolling: Min assist   Supine to sit: Min assist, +2 for safety/equipment     General bed mobility comments: Min A for bringing hips over during roll. Min A for elevating trunk - pt holding therapists hand to pull up, posterior bias.    Transfers Overall transfer level: Needs assistance Equipment used: Sliding board Transfers: Bed to chair/wheelchair/BSC            Lateral/Scoot Transfers: Min guard, +2 safety/equipment, With slide board General transfer comment: Cues for hand placement. Pt able to manage sliding board (both placement and then removing). Min Guard A for safety due to 1 almost LOB posteriorly during transfer, cues to regroup.    Ambulation/Gait                   Stairs             Wheelchair Mobility    Modified Rankin (Stroke Patients Only)       Balance Overall balance assessment: Needs assistance Sitting-balance support: No upper extremity supported, Feet supported Sitting balance-Leahy Scale: Fair Sitting balance - Comments: CLose Min guard-Mod A with 1 complete LOB backwards sitting EOB with scooting. Postural control: Posterior lean                                  Cognition Arousal/Alertness: Awake/alert Behavior During Therapy: WFL for tasks assessed/performed Overall Cognitive Status: Within Functional Limits for tasks assessed  General Comments: Following cues. Slightly anxious wtih reaching exercises. In good spirits due to having diet upgraded and having a d/c plan        Exercises Amputee Exercises Quad Sets: AROM, 10 reps, Supine, Left Hip Extension: Right, Supine, 10  reps Hip ABduction/ADduction: AROM, Both, 10 reps, Seated Hip Flexion/Marching: AROM, Both, 10 reps, Seated Knee Flexion: AAROM, Left, Supine, 10 reps Knee Extension: AROM, Left, Supine, 10 reps Straight Leg Raises: AROM, Left, Supine, 10 reps Chair Push Up: Seated, 20 reps, Strengthening, Both    General Comments General comments (skin integrity, edema, etc.): HR 117 bpm      Pertinent Vitals/Pain Pain Assessment Pain Assessment: Faces Faces Pain Scale: Hurts a little bit Pain Location: Rt residual limb Pain Descriptors / Indicators: Sore, Operative site guarding Pain Intervention(s): Monitored during session, Repositioned    Home Living                          Prior Function            PT Goals (current goals can now be found in the care plan section) Progress towards PT goals: Progressing toward goals    Frequency    Min 3X/week      PT Plan Current plan remains appropriate    Co-evaluation   Reason for Co-Treatment: To address functional/ADL transfers;For patient/therapist safety PT goals addressed during session: Mobility/safety with mobility;Balance OT goals addressed during session: ADL's and self-care      AM-PAC PT "6 Clicks" Mobility   Outcome Measure  Help needed turning from your back to your side while in a flat bed without using bedrails?: A Little Help needed moving from lying on your back to sitting on the side of a flat bed without using bedrails?: A Little Help needed moving to and from a bed to a chair (including a wheelchair)?: Total Help needed standing up from a chair using your arms (e.g., wheelchair or bedside chair)?: Total Help needed to walk in hospital room?: Total Help needed climbing 3-5 steps with a railing? : Total 6 Click Score: 10    End of Session   Activity Tolerance: Patient tolerated treatment well Patient left: in chair;with call bell/phone within reach;with family/visitor present Nurse Communication:  Mobility status PT Visit Diagnosis: Muscle weakness (generalized) (M62.81);Other abnormalities of gait and mobility (R26.89);Pain Pain - Right/Left: Right Pain - part of body: Leg     Time: 0321-2248 PT Time Calculation (min) (ACUTE ONLY): 30 min  Charges:  $Therapeutic Activity: 8-22 mins                     Marisa Severin, PT, DPT Acute Rehabilitation Services Pager [phone] Office [place] 09/15/2021, 12:11 PM

## 2021-09-15 NOTE — Progress Notes (Signed)
Initial Nutrition Assessment  DOCUMENTATION CODES:   Severe malnutrition in context of chronic illness  INTERVENTION:   Nepro Shake po BID, each supplement provides 425 kcal and 19 grams protein.  Renal MVI daily.  Renal education provided.  NUTRITION DIAGNOSIS:   Severe Malnutrition related to chronic illness (ESRD on HD) as evidenced by severe muscle depletion, severe fat depletion.  GOAL:   Patient will meet greater than or equal to 90% of their needs  MONITOR:   PO intake, Supplement acceptance, Labs, Skin  REASON FOR ASSESSMENT:   Malnutrition Screening Tool    ASSESSMENT:   66 yo male admitted with LLE critical limb ischemia. He was transferred from CIR to acute care for A fib with RVR. PMH includes ESRD on HD, HTN, HLD, PAF, depression, chronic respiratory failure on 2 L oxygen at home, PAD, R BKA 12/22.  S/P L BKA 1/25. S/P R AKA (BKA revision) 2/1.  Patient reports that his weight has dropped, attributes this to recent amputations. He says he has lost a lot of fat on his belly, but suspect this was fluids. He has been drinking Ensure supplements, but wants to try Nepro supplements instead. He likes that he is now on a regular diet. The renal diet was too restrictive and he wasn't eating well. Since diet was liberalized to regular ~1/31, he has been eating a little better. Patient c/o dry mouth. Provided education on renal disease and dry mouth.  Labs reviewed. K and Phos WNL  Medications reviewed and include Aranesp, Protonix, Senokot, Renvela, ferris gluconate.  Weight history reviewed.  70.3 kg on admission 1/23. Currently 63.5 kg. Weight decline r/t recent amputations.  NUTRITION - FOCUSED PHYSICAL EXAM:  Flowsheet Row Most Recent Value  Orbital Region Severe depletion  Upper Arm Region Severe depletion  Thoracic and Lumbar Region Severe depletion  Buccal Region Moderate depletion  Temple Region Moderate depletion  Clavicle Bone Region Severe  depletion  Clavicle and Acromion Bone Region Severe depletion  Scapular Bone Region Severe depletion  Dorsal Hand Severe depletion  Patellar Region Severe depletion  [L leg]  Anterior Thigh Region Severe depletion  [L leg]  Posterior Calf Region Unable to assess  Edema (RD Assessment) Mild  Hair Reviewed  Eyes Reviewed  Mouth Reviewed  Skin Reviewed  Nails Reviewed       Diet Order:   Diet Order             Diet regular Room service appropriate? Yes; Fluid consistency: Thin  Diet effective now                   EDUCATION NEEDS:   Education needs have been addressed  Skin:  Skin Assessment: Skin Integrity Issues: Skin Integrity Issues:: Incisions Incisions: L BKA, R AKA  Last BM:  2/5  Height:   Ht Readings from Last 1 Encounters:  09/09/21 5\' 9"  (1.753 m)    Weight:   Wt Readings from Last 1 Encounters:  09/15/21 63.5 kg    BMI:  Body mass index is 24.17 kg/m.  Estimated Nutritional Needs:   Kcal:  1900-2200  Protein:  90-100 gm  Fluid:  1 L + UOP    Lucas Mallow RD, LDN, CNSC Please refer to Amion for contact information.

## 2021-09-16 LAB — RENAL FUNCTION PANEL
Albumin: 2 g/dL — ABNORMAL LOW (ref 3.5–5.0)
Anion gap: 12 (ref 5–15)
BUN: 31 mg/dL — ABNORMAL HIGH (ref 8–23)
CO2: 26 mmol/L (ref 22–32)
Calcium: 7.1 mg/dL — ABNORMAL LOW (ref 8.9–10.3)
Chloride: 96 mmol/L — ABNORMAL LOW (ref 98–111)
Creatinine, Ser: 5.22 mg/dL — ABNORMAL HIGH (ref 0.61–1.24)
GFR, Estimated: 11 mL/min — ABNORMAL LOW (ref >60)
Glucose, Bld: 83 mg/dL (ref 70–99)
Phosphorus: 4.3 mg/dL (ref 2.5–4.6)
Potassium: 4.3 mmol/L (ref 3.5–5.1)
Sodium: 134 mmol/L — ABNORMAL LOW (ref 135–145)

## 2021-09-16 LAB — HEPATITIS B SURFACE ANTIBODY,QUALITATIVE: Hep B S Ab: REACTIVE — AB

## 2021-09-16 LAB — HEPATITIS B SURFACE ANTIGEN: Hepatitis B Surface Ag: NONREACTIVE

## 2021-09-16 MED ORDER — BISACODYL 10 MG RE SUPP: 10.0000 mg | Freq: Every day | RECTAL | Status: DC | PRN

## 2021-09-16 MED ORDER — ACETAMINOPHEN 325 MG PO TABS: 650.0000 mg | ORAL_TABLET | Freq: Four times a day (QID) | ORAL | Status: DC | PRN

## 2021-09-16 MED ORDER — HYDROCORTISONE ACETATE 25 MG RE SUPP: 25.0000 mg | Freq: Two times a day (BID) | RECTAL | Status: AC

## 2021-09-16 MED ORDER — SEVELAMER CARBONATE 800 MG PO TABS: ORAL_TABLET | ORAL | Status: DC

## 2021-09-16 MED ORDER — SERTRALINE HCL 100 MG PO TABS: 100.0000 mg | ORAL_TABLET | Freq: Every day | ORAL | Status: DC

## 2021-09-16 MED ORDER — RENA-VITE PO TABS: 1.0000 | ORAL_TABLET | Freq: Every day | ORAL | Status: DC

## 2021-09-16 MED ORDER — POLYETHYLENE GLYCOL 3350 17 G PO PACK: 17.0000 g | PACK | Freq: Every day | ORAL | Status: DC

## 2021-09-16 MED ORDER — METOPROLOL TARTRATE 25 MG PO TABS: 37.5000 mg | ORAL_TABLET | Freq: Two times a day (BID) | ORAL | Status: DC

## 2021-09-16 MED ORDER — NEPRO/CARBSTEADY PO LIQD: 237.0000 mL | Freq: Three times a day (TID) | ORAL | Status: DC

## 2021-09-16 MED ORDER — MIDODRINE HCL 10 MG PO TABS: 10.0000 mg | ORAL_TABLET | Freq: Three times a day (TID) | ORAL | Status: DC

## 2021-09-16 MED ORDER — OXYCODONE HCL 5 MG PO TABS: 5.0000 mg | ORAL_TABLET | Freq: Four times a day (QID) | ORAL | 0 refills | Status: DC | PRN

## 2021-09-16 MED ORDER — WITCH HAZEL-GLYCERIN EX PADS: MEDICATED_PAD | CUTANEOUS | Status: DC | PRN

## 2021-09-16 NOTE — Progress Notes (Signed)
Scott Castro Progress Note   66 y.o. male PMH COPD Barrett's, hyperparathyroidism, ESRD, failed renal transplant, PAF on Eliquis p/w ischemic foot ulcer -> BKA on 12/30.  Patient receives his dialysis treatments at [place] with [facility].   Dialysis at [facility] with [facility] EDW 68.4kg? Per charting. EDW before the amputation was 72kg      Assessment/ Plan:   ESRD - TTS at [facility] w/ [facility] now MWF - HD per MWF schedule - establishing new EDW.  Potentially 65 kg. He has been lower than that but tachy (nadir weight 63.5kg 09/15/21 - concern for accuracy).  Will note his post-weight from today as pre-weight is 65.5 kg today -  Uses elisio dialyzer at [place] but has been tolerating our standard optiflux dialyzer here  PAD with critical limb ischemia s/p rt BKA for gangrene - now s/p LBKA -  done 1/25.  Now status post right AKA on 09/09/2021  PAF - rate controlled and on Eliquis. Here on dilt and metop - cont even prior to dialysis.  Was having some RVR periodically during this admission and cardiology has been consulted.  Beta blocker titrated.  He's on midodrine now as well.   Anemia CKD - on ESA and s/p iron load.  Required transfusion this hosp.  Has been on Aranesp 100 mcg weekly on fridays.   Metabolic bone disease - phos at goal on renvela; calcium improves with correction for albumin however paused sensipar for now  GERD per primary team   COPD per primary team    9. Dispo: disposition per primary team.  private transport to [place] HD unit from his [place] too costly, he was switched from [name] to [facility] MWF.   Possibly going back to CIR or SNF.  Depending on final dispo he may need an alternate unit. HD SW is aware and following.     Subjective:    Seen and examined on dialysis.  Blood pressure 115/87 and HR 104.  Tolerating goal.  Procedure supervised.  Using Left AVG   Review of systems:  Denies n/v Denies chest pain   Denies shortness of breath     BP 101/75    Pulse (!) 113    Temp (!) 97.1 F (36.2 C) (Temporal)    Resp 16    Ht 5\' 9"  (1.753 m)    Wt 65.5 kg    SpO2 98%    BMI 21.32 kg/m   Intake/Output Summary (Last 24 hours) at 09/16/2021 0910 Last data filed at 09/16/2021 1607 Gross per 24 hour  Intake 240 ml  Output 0 ml  Net 240 ml   Weight change: 1.2 kg  Physical Exam:  GEN: Lying in bed, no distress HEENT: NCAT LUNGS: clear and unlabored on room air CV: Tachycardia, no edema appreciated residual limbs ABD: SNDNT   EXT:R AKA new left BKA wrapped Neuro awake and conversant Psych -normal mood and affect Access: Left AVG in use  Imaging: No results found.  Labs: BMET Recent Labs  Lab 09/10/21 0334 09/11/21 0135 09/12/21 0807 09/14/21 0619 09/16/21 0328  NA 132* 134* 136 137 134*  K 5.6* 4.0 4.6 4.4 4.3  CL 97* 97* 98 98 96*  CO2 23 26 25 27 26   GLUCOSE 122* 110* 91 86 83  BUN 44* 26* 38* 30* 31*  CREATININE 6.10* 3.96* 5.49* 5.21* 5.22*  CALCIUM 7.3* 7.6* 7.3* 7.1* 7.1*  PHOS 6.4* 3.8 4.1 4.5 4.3   CBC  Recent Labs  Lab 09/12/21 0201 09/13/21 0207 09/14/21 0619 09/15/21 0043  WBC 7.0 5.8 5.4 5.2  HGB 9.6* 9.5* 9.8* 9.7*  HCT 32.1* 31.9* 33.5* 32.1*  MCV 101.6* 101.3* 101.8* 100.6*  PLT 256 274 274 220    Medications:     apixaban  5 mg Oral BID   atorvastatin  10 mg Oral QHS   Chlorhexidine Gluconate Cloth  6 each Topical Q0600   Chlorhexidine Gluconate Cloth  6 each Topical Q0600   [START ON 09/18/2021] Darbepoetin Alfa  100 mcg Intravenous Q Fri-HD   diltiazem  120 mg Oral Daily   docusate sodium  100 mg Oral TID   feeding supplement (NEPRO CARB STEADY)  237 mL Oral TID BM   gabapentin  100 mg Oral BID   hydrocortisone  25 mg Rectal BID   metoprolol tartrate  37.5 mg Oral BID   midodrine  10 mg Oral TID WC   multivitamin  1 tablet Oral QHS   pantoprazole  40 mg Oral Daily   polyethylene glycol  17 g Oral Daily   senna  2 tablet Oral QHS    sertraline  100 mg Oral Daily   sevelamer carbonate  2,400 mg Oral TID WC    Claudia Desanctis, MD 09/16/2021 9:21 AM

## 2021-09-16 NOTE — Discharge Summary (Signed)
Physician Discharge Summary  Scott Castro:[id number] DOB: [birth year]  PCP: Scott Bend, MD  Admitted from: Inpatient rehab Discharged to: SNF  Admit date: 08/30/2021 Discharge date: 09/16/2021  Recommendations for Outpatient Follow-up:    Follow-up Information     [facility] [place] Follow up in 4 week(s).   Specialty: Vascular Surgery Why: Office will call you to arrange your appt (sent) Contact information: [address] [place], [place] Castro Follow up on 09/18/2021.   Why: Schedule is Monday/Wednesday/Friday with 11:15 chair time.  For first appointment, patient will need to arrive at 10:30 for paperwork. Contact information: [place] [address] [phone]         MD at SNF Follow up.   Why: To be seen in 2 to 3 days with repeat labs (CBC & BMP).  Labs can be drawn across his regular dialysis.        Scott Bend, MD. Schedule an appointment as soon as possible for a visit.   Specialty: Nephrology Why: Upon discharge from SNF. Contact information: [place] [address] [phone]         Scott Cowman, MD. Schedule an appointment as soon as possible for a visit.   Specialty: Cardiology Contact information: [facility] [address] Trainer: None    Equipment/Devices: TBD at [facility]    Discharge Condition: Improved and stable.   Code Status: DNR Diet recommendation:  Discharge Diet Orders (From admission, onward)     Start     Ordered   09/16/21 0000  Diet - low sodium heart healthy        09/16/21 1459             Discharge Diagnoses:  Principal Problem:   Critical limb ischemia of left lower extremity/PAD  Active Problems:   COPD (chronic obstructive pulmonary disease) (HCC)   Chronic respiratory  failure with hypoxia (HCC)   ESRD (end stage renal disease) (HCC)   Barrett esophagus   Anemia   Mixed hyperlipidemia   S/P BKA (below knee amputation), right (HCC)   Depressive reaction   AF (paroxysmal atrial fibrillation) (HCC)   Protein-calorie malnutrition, severe   Brief Summary: 66 year old male with past medical history significant for ESRD on dialysis TTS, hypertension, hyperlipidemia, PAF, depression, chronic respiratory failure 2 L of oxygen, PAD recent right BKA on 08/07/2021 secondary to critical limb ischemia discharged to inpatient rehab.  He was seen by cardiology in inpatient rehab due to A. fib RVR and he was referred for inpatient telemetry hospital admission.   Recent hospitalized at [facility] from 67/28 until 1//2023 for right lower extremity critical limb ischemia status post BKA by vascular on 12/30.  Was just discharged to inpatient rehab here at [facility].  Found to be in RVR RVR admitted for further evaluation.   Patient  also had angiography 1/20 by Dr. Charlynn Castro with BL diffuse infrapopliteal disease of the left lower extremity, vascular recommended  left BKA. Patient underwent Left BKA 1/25.   Patient noted to be more hypoxic on 1/23, chest x-ray showed bilateral loculated pleural effusion, pulmonologist consulted for further evaluation. CT chest showed right loculated pleural effusion on right. Right lower lobe with irregular consolidation right lower lobe, he will  need PET scan out patient. His oxygen requirement has remain stable. No safe window for thoracentesis. He will need CT chest out patient follow up    He developed anterior flap necrosis of right BKA> vascular performed right AKA on 2/1  Assessment & Plan:   1-Critical Limb ischemia of left lower extremity/PAD -Angiography 1/20 by Dr. Stanford Castro revealed diffuse infrapopliteal disease of the left with no revascularization options. -Recent BKA on the right 12/22 -Underwent  left BKA 1/25th -Tolerated procedure  well.  -Dressing  removed 1/27. Unfortunately developed necrotic flap on right BKA and was transitioned to right AKA on 2/1 -Further postop care per vascular surgery - As per vascular surgery follow-up on 2/6, right AKA with Prevena incisional VAC to be removed 2/8 hours prior to DC.  Left BKA well-appearing, still some bloody drainage on dressing but they plan to continue to monitor.  Stump sock was applied.  Outpatient follow-up with vascular surgery.   2-A. Fib Continue with  metoprolol and Cardizem.  Appreciate cardiology assistance.  On anticoagulation with Eliquis HR ranging between 100-115 Per cardiology notes, this is a reasonable range. Unless HR begins to sustain higher or patient becomes symptomatic, no further adjustment of meds at this time In addition patient also on midodrine.  Discussed with nephrology who recommends continuing same along with his Cardizem and metoprolol.  This can be reviewed closely as outpatient to determine if he can be weaned off midodrine.   3-Acute on chronic hypoxic respiratory failure Loculated bilateral moderate pleural effusion Moderate Pulmonary HTN;  COPD -Pulmonologist consulted. -He has been weaned down to room air and is saturating in the high 90s and even up to 100. -CT chest 1/23: Patchy nodularity and ground-glass in the dependent RIGHT upper lobe and bilateral lower lobes raising the question of pneumonitis with distribution raising the question of sequela of aspiration. More dense consolidative changes and irregular bronchial structures in the RIGHT lower lobe central to an area of rounded atelectasis is of uncertain significance. Given the more mass like appearance in this area and increasing size of mediastinal lymph nodes over time a possibility of neoplasm is considered. PET scan may be helpful on follow-up in addition to contrasted imaging with CT for further assessment.  Loculated and mildly nodular appearing pleural fluid in the RIGHT  chest with increase and with decreased pleural fluid in the LEFT chest compared to prior imaging.  -IR consulted for thoracentesis.  There was no fluid pocket identified that was safe to access by ultrasound thoracentesis. Stable on RA Needs follow up CT chest out patient.    As per significant issues on pulmonology signoff note 09/04/2021: - Loculated pleural effusion: Needs radiology follow-up. - Moderate pulmonary hypertension: Related to underlying lung disease and probably chronic hypoxemia.  Class V pulmonary hypertension from renal disease probably contributing. - Underlying concern for asbestos related lung disease, asbestos related pleural effusion based on previous CT scan findings.  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 - COPD: Continue bronchodilators Previously followed up at [facility]-follow-up at [facility].  This can be set up by SNF at time of discharge or by his PCP.   4-ESRD on hemodialysis -S/p renal transplant [place] with hemodialysis -Nephrology  following. Now on hemodialysis per MWF schedule.  Patient now has a new outpatient HD unit and is supposed to start there on 09/18/2021.  Hepatitis serologies drawn today and  are in process.  As discussed with Dr. Royce Macadamia, continue to hold Sensipar at discharge and decision can be made during outpatient follow-up.  She also recommended continuing midodrine along with Cardizem CD and metoprolol.  Doses of metoprolol were increased during this hospitalization     Barrett esophagus: Continue with PPI   Anemia:  anemia of chronic disease: IV iron per nephrology Hemoglobin currently stable.  Outpatient follow-up and Aranesp as per nephrology.  Required transfusion in the hospital.   Hyperlipidemia: Continue with statins   Depression: Continue with Zoloft   Hyponatremia; fluid management with HD>    Severe tricuspid  regurgitation, mild mitral regurgitation. Management per cardiology.  Outpatient follow-up with cardiology.   Goals of care -Seen by palliative care -Patient is DNR   Body mass index is 21.32 kg/m.     Consultants:  Nephrology Cardiology Vascular.  Pulmonologist  Palliative care   Procedures:  Left BKA 1/25 Right AKA 2/1   Discharge Instructions  Discharge Instructions     Call MD for:  difficulty breathing, headache or visual disturbances   Complete by: As directed    Call MD for:  extreme fatigue   Complete by: As directed    Call MD for:  persistant dizziness or light-headedness   Complete by: As directed    Call MD for:  persistant nausea and vomiting   Complete by: As directed    Call MD for:  redness, tenderness, or signs of infection (pain, swelling, redness, odor or green/yellow discharge around incision site)   Complete by: As directed    Call MD for:  severe uncontrolled pain   Complete by: As directed    Call MD for:  temperature >100.4   Complete by: As directed    Diet - low sodium heart healthy   Complete by: As directed    Discharge wound care:   Complete by: As directed    As per vascular surgery recommendations.   Increase activity slowly   Complete by: As directed         Medication List     STOP taking these medications    cinacalcet 60 MG tablet Commonly known as: SENSIPAR   diclofenac Sodium 1 % Gel Commonly known as: VOLTAREN   HYDROcodone-acetaminophen 7.5-325 MG tablet Commonly known as: NORCO   methocarbamol 500 MG tablet Commonly known as: ROBAXIN       TAKE these medications    acetaminophen 325 MG tablet Commonly known as: TYLENOL Take 2 tablets (650 mg total) by mouth every 6 (six) hours as needed for mild pain or moderate pain.   albuterol 108 (90 Base) MCG/ACT inhaler Commonly known as: VENTOLIN HFA Inhale 2 puffs into the lungs every 6 (six) hours as needed for wheezing or shortness of breath.   apixaban  5 MG Tabs tablet Commonly known as: ELIQUIS Take 1 tablet (5 mg total) by mouth 2 (two) times daily.   atorvastatin 10 MG tablet Commonly known as: LIPITOR Take 1 tablet (10 mg total) by mouth at bedtime.   bisacodyl 10 MG suppository Commonly known as: DULCOLAX Place 1 suppository (10 mg total) rectally daily as needed for moderate constipation.   diltiazem 120 MG 24 hr capsule Commonly known as: CARDIZEM CD Take 1 capsule (120 mg total) by mouth daily. Hold for sbp < 100   feeding supplement (NEPRO CARB STEADY) Liqd Take 237 mLs by mouth 3 (three) times daily between meals. What changed: when to take this   gabapentin 100 MG  capsule Commonly known as: NEURONTIN Take 1 capsule (100 mg total) by mouth 2 (two) times daily.   hydrocortisone 25 MG suppository Commonly known as: ANUSOL-HC Place 1 suppository (25 mg total) rectally 2 (two) times daily for 14 days.   metoprolol tartrate 25 MG tablet Commonly known as: LOPRESSOR Take 1.5 tablets (37.5 mg total) by mouth 2 (two) times daily. What changed: how much to take   midodrine 10 MG tablet Commonly known as: PROAMATINE Take 1 tablet (10 mg total) by mouth 3 (three) times daily with meals.   multivitamin Tabs tablet Take 1 tablet by mouth at bedtime.   oxyCODONE 5 MG immediate release tablet Commonly known as: Oxy IR/ROXICODONE Take 1 tablet (5 mg total) by mouth every 6 (six) hours as needed for severe pain.   pantoprazole 40 MG tablet Commonly known as: PROTONIX Take 40 mg by mouth daily.   polyethylene glycol 17 g packet Commonly known as: MIRALAX / GLYCOLAX Take 17 g by mouth daily. What changed:  when to take this reasons to take this   senna-docusate 8.6-50 MG tablet Commonly known as: Senokot-S Take 2 tablets by mouth 2 (two) times daily.   sertraline 100 MG tablet Commonly known as: ZOLOFT Take 1 tablet (100 mg total) by mouth daily. Start taking on: September 17, 2021   sevelamer carbonate 800 MG  tablet Commonly known as: RENVELA Take 2400 mg by mouth three times daily with meals, 800 mg 3 times daily as needed with snacks What changed:  how to take this when to take this additional instructions   witch hazel-glycerin pad Commonly known as: TUCKS Apply topically as needed for itching.       No Known Allergies    Procedures/Studies: CT CHEST WO CONTRAST  Result Date: 08/31/2021 CLINICAL DATA:  Loculated LEFT pleural effusion EXAM: CT CHEST WITHOUT CONTRAST TECHNIQUE: Multidetector CT imaging of the chest was performed following the standard protocol without IV contrast. RADIATION DOSE REDUCTION: This exam was performed according to the departmental dose-optimization program which includes automated exposure control, adjustment of the mA and/or kV according to patient size and/or use of iterative reconstruction technique. COMPARISON:  July 29, 2021 and high-resolution chest CT from April of 2020. FINDINGS: Cardiovascular: Calcified atheromatous plaque of the thoracic aorta without aneurysmal dilation. Moderate cardiomegaly without substantial change. Small amount of pericardial fluid is diminished compared to previous imaging. Three-vessel coronary artery disease, extensive coronary artery calcifications as before. Mitral annular calcifications. Central pulmonary vasculature 3.3 cm similar to previous imaging. Mediastinum/Nodes: 18 mm RIGHT paratracheal lymph node, previously 16 mm (image 70/4) Small pre-vascular lymph nodes less than a cm are stable, enlarged compared to more remote studies. Mildly enlarged paratracheal lymph nodes also with slight enlargement since recent imaging (image 60/4) 15 mm short axis, previously 12 mm. Similar slight enlargement of other lymph nodes throughout the RIGHT paratracheal chain. Mild subcarinal nodal enlargement approximately 12 mm short axis is similar to prior imaging. No hilar adenopathy to the extent evaluated on noncontrast imaging. The  esophagus is normal on limited assessment. No axillary adenopathy. Lungs/Pleura: Lenticular loculated RIGHT-sided pleural effusion along the lateral RIGHT chest (image 56/4) minimal fluid in this location before now measuring 7.2 x 2.9 cm greatest axial dimension, contiguous with loculated pleural fluid in the inferior RIGHT chest. Suspect mild underlying nodularity. Pleural fluid is circumferential in the RIGHT chest and increased in the medial RIGHT upper chest (image 36/4) 15 mm short axis, thickness of pleural fluid at the RIGHT lung apex.  Other areas of loculated pleural fluid in the inferior RIGHT chest with similar appearance. Suspected rounded atelectasis amidst chronic pleural fluid in the RIGHT lower chest is similar. Loculated pleural fluid in the major fissure in the LEFT chest is diminished in size (image 77/4) 5.8 x 5.1 cm as compared to 10.0 x 5.2 cm. Chronic appearing volume loss at the LEFT lung base also similar. Interstitial thickening and ground-glass with nodularity has developed in the posterior RIGHT upper lobe since previous imaging and generalized interstitial thickening and ground-glass also present in the middle lobe as well as the LEFT lower lobe. Subcentimeter areas of nodularity amidst these changes. In addition a rounded atelectasis there is further increase in soft tissue that tracks towards the RIGHT hilum, this area measuring 4.5 x 4.0 cm (image 101/3) RIGHT of atelectasis peripheral to this is stable as outlined above. Upper Abdomen: Intra-abdominal ascites partially imaged. Aortic atherosclerosis tracks in the visceral branches in the upper abdomen, partially evaluated. Signs of marked renal atrophy not well assessed and incompletely imaged. Musculoskeletal: Signs of renal osteodystrophy. No acute or destructive bone finding. IMPRESSION: 1. Patchy nodularity and ground-glass in the dependent RIGHT upper lobe and bilateral lower lobes raising the question of pneumonitis with  distribution raising the question of sequela of aspiration. 2. More dense consolidative changes and irregular bronchial structures in the RIGHT lower lobe central to an area of rounded atelectasis is of uncertain significance. Given the more masslike appearance in this area and increasing size of mediastinal lymph nodes over time a possibility of neoplasm is considered. PET scan may be helpful on follow-up in addition to contrasted imaging with CT for further assessment. 3. Loculated and mildly nodular appearing pleural fluid in the RIGHT chest with increase and with decreased pleural fluid in the LEFT chest compared to prior imaging. This could also be further assessed with contrasted CT of the chest or PET as warranted. 4. Signs of end-stage renal disease. Aortic Atherosclerosis (ICD10-I70.0). Electronically Signed   By: Zetta Bills M.D.   On: 08/31/2021 19:33   PERIPHERAL VASCULAR CATHETERIZATION  Result Date: 08/28/2021 DATE OF SERVICE: 08/28/2021  PATIENT:  Scott Castro  66 y.o. male  PRE-OPERATIVE DIAGNOSIS:  Atherosclerosis of native arteries of left lower extremity causing ischemic skin changes  POST-OPERATIVE DIAGNOSIS:  Same  PROCEDURE:  1) US guided right common femoral artery access 2) Aortogram 3) Left lower extremity angiogram with second order cannulation (79mL total contrast) 4) Conscious sedation (31 minutes)  SURGEON:  [name]. Scott Breed, MD  ASSISTANT: none  ANESTHESIA:   local and IV sedation  ESTIMATED BLOOD LOSS: minimal  LOCAL MEDICATIONS USED:  LIDOCAINE  COUNTS: confirmed correct.  PATIENT DISPOSITION:  PACU - hemodynamically stable.  Delay start of Pharmacological VTE agent (>24hrs) due to surgical blood loss or risk of bleeding: no  INDICATION FOR PROCEDURE: RAMELL WACHA is a 66 y.o. male with ischemic skin changes about the left foot, and non-invasive evidence of peripheral arterial disease. After careful discussion of risks, benefits, and alternatives the patient was offered  angiography. The patient understood and wished to proceed.  OPERATIVE FINDINGS: Terminal aorta and iliac arteries: Aorta and iliacs widely patent Renals occluded  Left lower extremity: Common femoral artery: patent without stenosis Profunda femoris artery: patent without stenosis  Superficial femoral artery: patent without stenosis Popliteal artery: patent without stenosis Anterior tibial artery: only tibial vessel; occludes at the ankle. Tibioperoneal trunk: occluded Peroneal artery: occluded Posterior tibial artery: occluded Pedal circulation: fills via collaterals  DESCRIPTION OF PROCEDURE: After identification of the patient in the pre-operative holding area, the patient was transferred to the operating room. The patient was positioned supine on the operating room table. Anesthesia was induced. The groins was prepped and draped in standard fashion. A surgical pause was performed confirming correct patient, procedure, and operative location.  The right groin was anesthetized with subcutaneous injection of 1% lidocaine. Using ultrasound guidance, the right common femoral artery was accessed with micropuncture technique. Fluoroscopy was used to confirm cannulation over the femoral head. The 68F sheath was upsized to 79F.  A Benson wire was advanced into the distal aorta. Over the wire an omni flush catheter was advanced to the level of L2. Aortogram was performed - see above for details.  The left common iliac artery was selected with a benson guidewire. The wire was advanced into the common femoral artery. Over the wire the omni flush catheter was advanced into the external iliac artery. Selective angiography was performed - see above for details.  Conscious sedation was administered with the use of IV fentanyl and midazolam under continuous physician and nurse monitoring.  Heart rate, blood pressure, and oxygen saturation were continuously monitored.  Total sedation time was 31 minutes.  Upon completion of the case  instrument and sharps counts were confirmed correct. The patient was transferred to the PACU in good condition. I was present for all portions of the procedure.  PLAN: No options for revascularization. Will need a left below knee amputation which is not urgent. He is depressed, and wants to speak to a chaplain and palliative care to discuss options and to help with spiritual care. Maximal medical therapy for PAD.  [name]. Scott Breed, MD [facility] Phone Number: [phone] 08/28/2021 1:34 PM    DG CHEST PORT 1 VIEW  Result Date: 08/31/2021 CLINICAL DATA:  Hypoxemia. EXAM: PORTABLE CHEST 1 VIEW COMPARISON:  07/03/2021 and CT chest 07/29/2021. FINDINGS: Trachea is midline. Heart is enlarged. Loculated pleural effusions bilaterally with scattered airspace opacities, right greater than left. IMPRESSION: Moderate loculated bilateral pleural effusions with scattered associated volume loss. Electronically Signed   By: Lorin Picket M.D.   On: 08/31/2021 10:26   ECHOCARDIOGRAM COMPLETE  Result Date: 08/31/2021    ECHOCARDIOGRAM REPORT   Patient Name:   MAYSIN CARSTENS Date of Exam: 08/31/2021 Medical Rec #:  [id number]    Height:       69.0 in Accession #:    [id number]   Weight:       155.0 lb Date of Birth:  04/17/1956    BSA:          1.854 m Patient Age:    19 years     BP:           112/81 mmHg Patient Gender: M            HR:           112 bpm. Exam Location:  Inpatient Procedure: 2D Echo Indications:    Murmur  History:        Patient has prior history of Echocardiogram examinations, most                 recent 04/09/2019.  Sonographer:    Arlyss Gandy Referring Phys: [id number] Crista Luria BHAGAT IMPRESSIONS  1. Abnormal septal motion . Left ventricular ejection fraction, by estimation, is 50 to 55%. The left ventricle has low normal function. The left ventricle has no regional wall motion  abnormalities. Left ventricular diastolic parameters are indeterminate.  2. Right  ventricular systolic function is moderately reduced. The right ventricular size is moderately enlarged. There is moderately elevated pulmonary artery systolic pressure.  3. Left atrial size was severely dilated.  4. Right atrial size was severely dilated.  5. Calcified subchordal apparatus. The mitral valve is abnormal. Mild mitral valve regurgitation. No evidence of mitral stenosis. Moderate mitral annular calcification.  6. The tricuspid valve is abnormal. Tricuspid valve regurgitation is severe.  7. The aortic valve is tricuspid. There is moderate calcification of the aortic valve. Aortic valve regurgitation is not visualized. Aortic valve sclerosis/calcification is present, without any evidence of aortic stenosis.  8. The inferior vena cava is dilated in size with <50% respiratory variability, suggesting right atrial pressure of 15 mmHg. FINDINGS  Left Ventricle: Abnormal septal motion. Left ventricular ejection fraction, by estimation, is 50 to 55%. The left ventricle has low normal function. The left ventricle has no regional wall motion abnormalities. The left ventricular internal cavity size was normal in size. There is no left ventricular hypertrophy. Left ventricular diastolic parameters are indeterminate. Right Ventricle: The right ventricular size is moderately enlarged. Right vetricular wall thickness was not assessed. Right ventricular systolic function is moderately reduced. There is moderately elevated pulmonary artery systolic pressure. The tricuspid regurgitant velocity is 3.20 m/s, and with an assumed right atrial pressure of 15 mmHg, the estimated right ventricular systolic pressure is 18.8 mmHg. Left Atrium: Left atrial size was severely dilated. Right Atrium: Right atrial size was severely dilated. Pericardium: There is no evidence of pericardial effusion. Mitral Valve: Calcified subchordal apparatus. The mitral valve is abnormal. There is moderate thickening of the mitral valve leaflet(s).  There is mild calcification of the mitral valve leaflet(s). Moderate mitral annular calcification. Mild mitral valve regurgitation. No evidence of mitral valve stenosis. Tricuspid Valve: The tricuspid valve is abnormal. Tricuspid valve regurgitation is severe. No evidence of tricuspid stenosis. Aortic Valve: The aortic valve is tricuspid. There is moderate calcification of the aortic valve. Aortic valve regurgitation is not visualized. Aortic regurgitation PHT measures 629 msec. Aortic valve sclerosis/calcification is present, without any evidence of aortic stenosis. Aortic valve mean gradient measures 3.0 mmHg. Aortic valve peak gradient measures 5.8 mmHg. Aortic valve area, by VTI measures 2.21 cm. Pulmonic Valve: The pulmonic valve was normal in structure. Pulmonic valve regurgitation is mild. No evidence of pulmonic stenosis. Aorta: The aortic root is normal in size and structure. Venous: The inferior vena cava is dilated in size with less than 50% respiratory variability, suggesting right atrial pressure of 15 mmHg. IAS/Shunts: No atrial level shunt detected by color flow Doppler.  LEFT VENTRICLE PLAX 2D LVIDd:         4.80 cm   Diastology LVIDs:         3.80 cm   LV e' medial:    9.46 cm/s LV PW:         0.90 cm   LV E/e' medial:  11.3 LV IVS:        1.10 cm   LV e' lateral:   10.90 cm/s LVOT diam:     2.00 cm   LV E/e' lateral: 9.8 LV SV:         39 LV SV Index:   21 LVOT Area:     3.14 cm  RIGHT VENTRICLE            IVC RV Basal diam:  4.90 cm    IVC diam: 2.60 cm RV  Mid diam:    3.80 cm RV S prime:     5.44 cm/s TAPSE (M-mode): 1.0 cm LEFT ATRIUM              Index        RIGHT ATRIUM           Index LA diam:        5.20 cm  2.81 cm/m   RA Area:     33.50 cm LA Vol (A2C):   119.5 ml 64.46 ml/m  RA Volume:   125.00 ml 67.43 ml/m LA Vol (A4C):   63.1 ml  34.04 ml/m LA Biplane Vol: 91.9 ml  49.57 ml/m  AORTIC VALVE AV Area (Vmax):    2.14 cm AV Area (Vmean):   1.93 cm AV Area (VTI):     2.21 cm AV  Vmax:           120.00 cm/s AV Vmean:          78.300 cm/s AV VTI:            0.178 m AV Peak Grad:      5.8 mmHg AV Mean Grad:      3.0 mmHg LVOT Vmax:         81.60 cm/s LVOT Vmean:        48.000 cm/s LVOT VTI:          0.125 m LVOT/AV VTI ratio: 0.70 AI PHT:            629 msec  AORTA Ao Root diam: 3.30 cm MITRAL VALVE                TRICUSPID VALVE MV Area (PHT): 4.10 cm     TR Peak grad:   41.0 mmHg MV Decel Time: 185 msec     TR Vmax:        320.00 cm/s MV E velocity: 107.00 cm/s                             SHUNTS                             Systemic VTI:  0.12 m                             Systemic Diam: 2.00 cm Jenkins Rouge MD Electronically signed by Jenkins Rouge MD Signature Date/Time: 08/31/2021/2:52:51 PM    Final    IR US CHEST  Result Date: 09/03/2021 CLINICAL DATA:  Loculated right lateral pleural effusion by CT. EXAM: CHEST ULTRASOUND COMPARISON:  CT of the chest on 08/31/2021 FINDINGS: Ultrasound today demonstrates only a small amount of right pleural fluid which cannot be accessed for thoracentesis. IMPRESSION: Small amount of right pleural fluid by ultrasound. A large enough pocket was not identified to allow for thoracentesis today. Electronically Signed   By: Aletta Edouard M.D.   On: 09/03/2021 16:14      Subjective: Seen this morning at HD.  Denied complaints.  Specifically denied dyspnea, chest pain or pain elsewhere.  Aware that he was going to be discharged to SNF shortly.  Discharge Exam:  Vitals:   09/16/21 1130 09/16/21 1200 09/16/21 1226 09/16/21 1256  BP: 135/84 (!) 143/87 106/70 (!) 138/96  Pulse: (!) 114 (!) 114 (!) 115 (!) 115  Resp:   20 20  Temp:  97.8 F (36.6 C) 98.4 F (36.9 C)  TempSrc:   Temporal Oral  SpO2:    100%  Weight:      Height:        General: Pleasant middle-age male, moderately built and nourished lying comfortably supine in bed without distress. Cardiovascular: S1 & S2 heard, RRR, S1/S2 +. No murmurs, rubs, gallops or clicks. No JVD.   Telemetry personally reviewed: Atrial flutter with rates in the 100s-110s. Respiratory: Clear to auscultation without wheezing, rhonchi or crackles. No increased work of breathing. Abdominal:  Non distended, non tender & soft. No organomegaly or masses appreciated. Normal bowel sounds heard. CNS: Alert and oriented. No focal deficits. Extremities: no edema, no cyanosis.  Left BKA in stump shrinker.  Right AKA which still had wound VAC this morning and was supposed to be removed by the vascular team prior to discharge.  Left upper arm AV fistula and undergoing HD through same.    The results of significant diagnostics from this hospitalization (including imaging, microbiology, ancillary and laboratory) are listed below for reference.     Microbiology: Recent Results (from the past 240 hour(s))  Resp Panel by RT-PCR (Flu A&B, Covid) Nasopharyngeal Swab     Status: None   Collection Time: 09/15/21  1:49 PM   Specimen: Nasopharyngeal Swab; Nasopharyngeal(NP) swabs in vial transport medium  Result Value Ref Range Status   SARS Coronavirus 2 by RT PCR NEGATIVE NEGATIVE Final    Comment: (NOTE) SARS-CoV-2 target nucleic acids are NOT DETECTED.  The SARS-CoV-2 RNA is generally detectable in upper respiratory specimens during the acute phase of infection. The lowest concentration of SARS-CoV-2 viral copies this assay can detect is 138 copies/mL. A negative result does not preclude SARS-Cov-2 infection and should not be used as the sole basis for treatment or other patient management decisions. A negative result may occur with  improper specimen collection/handling, submission of specimen other than nasopharyngeal swab, presence of viral mutation(s) within the areas targeted by this assay, and inadequate number of viral copies(<138 copies/mL). A negative result must be combined with clinical observations, patient history, and epidemiological information. The expected result is  Negative.  Fact Sheet for Patients:  EntrepreneurPulse.com.au  Fact Sheet for Healthcare Providers:  IncredibleEmployment.be  This test is no t yet approved or cleared by the [place] FDA and  has been authorized for detection and/or diagnosis of SARS-CoV-2 by FDA under an Emergency Use Authorization (EUA). This EUA will remain  in effect (meaning this test can be used) for the duration of the COVID-19 declaration under Section 564(b)(1) of the Act, 21 U.S.C.section 360bbb-3(b)(1), unless the authorization is terminated  or revoked sooner.       Influenza A by PCR NEGATIVE NEGATIVE Final   Influenza B by PCR NEGATIVE NEGATIVE Final    Comment: (NOTE) The Xpert Xpress SARS-CoV-2/FLU/RSV plus assay is intended as an aid in the diagnosis of influenza from Nasopharyngeal swab specimens and should not be used as a sole basis for treatment. Nasal washings and aspirates are unacceptable for Xpert Xpress SARS-CoV-2/FLU/RSV testing.  Fact Sheet for Patients: EntrepreneurPulse.com.au  Fact Sheet for Healthcare Providers: IncredibleEmployment.be  This test is not yet approved or cleared by the [place] FDA and has been authorized for detection and/or diagnosis of SARS-CoV-2 by FDA under an Emergency Use Authorization (EUA). This EUA will remain in effect (meaning this test can be used) for the duration of the COVID-19 declaration under Section 564(b)(1) of the Act, 21 U.S.C. section 360bbb-3(b)(1), unless  the authorization is terminated or revoked.  Performed at [facility], [place] [address] [place], [place] [address]      Labs: CBC: Recent Labs  Lab 09/11/21 0135 09/12/21 0201 09/13/21 0207 09/14/21 0619 09/15/21 0043  WBC 7.4 7.0 5.8 5.4 5.2  HGB 9.9* 9.6* 9.5* 9.8* 9.7*  HCT 32.1* 32.1* 31.9* 33.5* 32.1*  MCV 99.7 101.6* 101.3* 101.8* 100.6*  PLT 270 256 274 274 220    Basic  Metabolic Panel: Recent Labs  Lab 09/10/21 0334 09/11/21 0135 09/12/21 0807 09/14/21 0619 09/16/21 0328  NA 132* 134* 136 137 134*  K 5.6* 4.0 4.6 4.4 4.3  CL 97* 97* 98 98 96*  CO2 23 26 25 27 26   GLUCOSE 122* 110* 91 86 83  BUN 44* 26* 38* 30* 31*  CREATININE 6.10* 3.96* 5.49* 5.21* 5.22*  CALCIUM 7.3* 7.6* 7.3* 7.1* 7.1*  PHOS 6.4* 3.8 4.1 4.5 4.3    Liver Function Tests: Recent Labs  Lab 09/10/21 0334 09/11/21 0135 09/12/21 0807 09/14/21 0619 09/16/21 0328  ALBUMIN 1.8* 1.9* 1.9* 1.9* 2.0*       Time coordinating discharge: 45 minutes  SIGNED:  Vernell Leep, MD,  FACP, [facility], [facility], [facility] (Care Management Physician Certified). [facility]  To contact the attending provider between 7A-7P or the covering provider during after hours 7P-7A, please log into the web site [email or web address] and access using universal [place] password for that web site. If you do not have the password, please call the hospital operator.

## 2021-09-16 NOTE — Progress Notes (Signed)
°  Progress Note    09/16/2021 4:07 PM 7 Days Post-Op  Subjective:  no complaints. Somewhat anxious about going to SNF   Vitals:   09/16/21 1226 09/16/21 1256  BP: 106/70 (!) 138/96  Pulse: (!) 115 (!) 115  Resp: 20 20  Temp: 97.8 F (36.6 C) 98.4 F (36.9 C)  SpO2:  100%   Physical Exam: Cardiac:  regular Lungs:  non labored Incisions:  L BKA well appearing with viable flaps. RIght AKA prevena wound VAC removed. Viable flaps  Neurologic: alert and oriented  CBC    Component Value Date/Time   WBC 5.2 09/15/2021 0043   RBC 3.19 (L) 09/15/2021 0043   HGB 9.7 (L) 09/15/2021 0043   HGB 11.4 (L) 06/11/2012 0501   HCT 32.1 (L) 09/15/2021 0043   HCT 34.6 (L) 06/11/2012 0501   PLT 220 09/15/2021 0043   PLT 251 06/11/2012 0501   MCV 100.6 (H) 09/15/2021 0043   MCV 95 06/11/2012 0501   MCH 30.4 09/15/2021 0043   MCHC 30.2 09/15/2021 0043   RDW 19.4 (H) 09/15/2021 0043   RDW 14.4 06/11/2012 0501   LYMPHSABS 0.8 08/26/2021 1004   LYMPHSABS 1.2 06/11/2012 0501   MONOABS 0.9 08/26/2021 1004   MONOABS 0.7 06/11/2012 0501   EOSABS 0.0 08/26/2021 1004   EOSABS 0.2 06/11/2012 0501   BASOSABS 0.0 08/26/2021 1004   BASOSABS 0.1 06/11/2012 0501    BMET    Component Value Date/Time   NA 134 (L) 09/16/2021 0328   NA 142 06/10/2012 0924   K 4.3 09/16/2021 0328   K 4.5 06/16/2013 1315   CL 96 (L) 09/16/2021 0328   CL 102 06/10/2012 0924   CO2 26 09/16/2021 0328   CO2 29 06/10/2012 0924   GLUCOSE 83 09/16/2021 0328   GLUCOSE 83 06/10/2012 0924   BUN 31 (H) 09/16/2021 0328   BUN 35 (H) 06/10/2012 0924   CREATININE 5.22 (H) 09/16/2021 0328   CREATININE 10.29 (H) 06/10/2012 0924   CALCIUM 7.1 (L) 09/16/2021 0328   CALCIUM 9.2 06/10/2012 0924   GFRNONAA 11 (L) 09/16/2021 0328   GFRNONAA 5 (L) 06/10/2012 0924   GFRAA 9 (L) 09/18/2019 0430   GFRAA 6 (L) 06/10/2012 0924    INR    Component Value Date/Time   INR 1.7 (H) 08/05/2021 1846     Intake/Output Summary (Last  24 hours) at 09/16/2021 1607 Last data filed at 09/16/2021 1226 Gross per 24 hour  Intake 240 ml  Output 1000 ml  Net -760 ml     Assessment/Plan:  66 y.o. male is s/p Right AKA 7 Days Post-Op  left BKA 14 days Post op  Prevena incisional vac removed from right AKA Viable flaps and well appearing Left BKA well appearing with viable flaps Continue stump sock on L BLA Can transition to stump sock on RIght AKA Okay for discharge to SNF from vascular standpoint Patient will follow up in 4 weeks for staple removal   Karoline Caldwell, PA-C [facility] [phone] 09/16/2021

## 2021-09-16 NOTE — Plan of Care (Signed)
°  Problem: Health Behavior/Discharge Planning: Goal: Ability to manage health-related needs will improve Outcome: Progressing   Problem: Clinical Measurements: Goal: Will remain free from infection Outcome: Progressing   Problem: Activity: Goal: Risk for activity intolerance will decrease Outcome: Progressing   Problem: Nutrition: Goal: Adequate nutrition will be maintained Outcome: Progressing   

## 2021-09-16 NOTE — TOC Transition Note (Addendum)
Transition of Care [facility]) - CM/SW Discharge Note   Patient Details  Name: Scott Castro MRN: [id number] Date of Birth: 1955-12-22  [facility]) CM/SW Contact:  Vinie Sill, LCSW Phone Number: 09/16/2021, 4:27 PM   Clinical Narrative:     Patient will Discharge to: [facility]) Discharge Date: 09/16/2021 Family Notified: patient declined Transport By: Corey Harold  Per MD patient is ready for discharge. RN, patient, and facility notified of discharge. Discharge Summary sent to facility. RN given number for [phone]. Ambulance transport requested for patient.   Clinical Social Worker signing off.  Thurmond Butts, MSW, LCSW Clinical Social Worker     Barriers to Discharge: Continued Medical Work up, SNF Pending bed offer   Patient Goals and CMS Choice Patient states their goals for this hospitalization and ongoing recovery are:: To be able to go home CMS Medicare.gov Compare Post Acute Care list provided to:: Patient Choice offered to / list presented to : Patient  Discharge Placement                       Discharge Plan and Services                                     Social Determinants of Health (SDOH) Interventions     Readmission Risk Interventions Readmission Risk Prevention Plan 07/22/2021 04/10/2019  Transportation Screening Complete Complete  PCP or Specialist Appt within 3-5 Days Complete Complete  HRI or Home Care Consult Complete Complete  Social Work Consult for [place] Planning/Counseling Complete Complete  Palliative Care Screening Not Applicable -  Medication Review Press photographer) Complete Complete  Some recent data might be hidden

## 2021-09-16 NOTE — Progress Notes (Addendum)
Spoke to [place] at [facility] this am and clinic does not have a MWF available. Navigator able to locate a MWF at [facility]. Contacted [facility] admissions to have pt re-clipped to [place] for review. Updated clinicals faxed to admissions this am as well. Will await final approval for [place]. Update provided to CSW who confirmed with snf that snf can meet transportation needs to/from [place] on MWF. Pt will need to arrive at 11:00 for 11:15 chair time. Will follow and assist.   Melven Sartorius Renal Navigator [phone]  Addendum at 2:54 pm: Pt has been accepted at [facility] on MWF schedule. Pt can start on Friday. Pt will need to arrive at 10:30 for first appt to complete paperwork. After that, pt will need to arrive at 11:00 for 11:15 chair time. Met with pt at bedside to provide this information. Pt provided an information sheet with details noted and also added to pt's AVS. CSW was provided this information to provide to snf as well. Contacted [facility] to make clinic aware pt will d/c today and start on Friday. Contacted renal PA regarding clinic's need for orders. Will fax new Hep B labs to [facility] as soon as they result.

## 2021-09-16 NOTE — Progress Notes (Signed)
This chaplain is present for F/U spiritual care and connecting with the Pt. next steps. The Pt. is eating lunch and reflecting on, healthy eating habits are important to the healing process.   The chaplain listened reflectively and understands the Pt. is optimistic and willing to positively participate in his discharge and community at [facility].  The chaplain discovered the Pt. methodically organizes and processes information.   The chaplain understands the Pt. is looking forward to reconnecting with "Pilar Plate" a peer mentor he met the previous weekend from [employer]. The chaplain understands the Pt. is holding onto Muir advice "you are what you put into it" as a place of hope for entering community with prosthetics. The chaplain gave the Pt. a second Animator.  This chaplain is available for F/U spiritual care as needed.  Chaplain Sallyanne Kuster [phone]

## 2021-09-16 NOTE — Progress Notes (Addendum)
Attempted to call report to [facility] at [phone] with no answer.  Will try again.  2 attempts  Daymon Larsen, RN

## 2021-09-17 DIAGNOSIS — T8612 Kidney transplant failure: Secondary | ICD-10-CM | POA: Insufficient documentation

## 2021-09-17 DIAGNOSIS — Z8616 Personal history of COVID-19: Secondary | ICD-10-CM | POA: Insufficient documentation

## 2021-09-17 DIAGNOSIS — I120 Hypertensive chronic kidney disease with stage 5 chronic kidney disease or end stage renal disease: Secondary | ICD-10-CM | POA: Insufficient documentation

## 2021-09-17 DIAGNOSIS — I272 Pulmonary hypertension, unspecified: Secondary | ICD-10-CM | POA: Insufficient documentation

## 2021-09-17 DIAGNOSIS — Z7901 Long term (current) use of anticoagulants: Secondary | ICD-10-CM | POA: Insufficient documentation

## 2021-09-17 DIAGNOSIS — D509 Iron deficiency anemia, unspecified: Secondary | ICD-10-CM | POA: Insufficient documentation

## 2021-09-17 DIAGNOSIS — Z89512 Acquired absence of left leg below knee: Secondary | ICD-10-CM | POA: Insufficient documentation

## 2021-09-17 DIAGNOSIS — N2581 Secondary hyperparathyroidism of renal origin: Secondary | ICD-10-CM | POA: Insufficient documentation

## 2021-09-17 DIAGNOSIS — Z992 Dependence on renal dialysis: Secondary | ICD-10-CM | POA: Insufficient documentation

## 2021-09-17 DIAGNOSIS — N250 Renal osteodystrophy: Secondary | ICD-10-CM | POA: Insufficient documentation

## 2021-09-17 LAB — HEPATITIS B SURFACE ANTIBODY, QUANTITATIVE: Hep B S AB Quant (Post): 101.2 m[IU]/mL (ref >9.9)

## 2021-09-18 DIAGNOSIS — T7840XA Allergy, unspecified, initial encounter: Secondary | ICD-10-CM | POA: Insufficient documentation

## 2021-09-18 DIAGNOSIS — R52 Pain, unspecified: Secondary | ICD-10-CM | POA: Insufficient documentation

## 2021-09-18 DIAGNOSIS — T782XXA Anaphylactic shock, unspecified, initial encounter: Secondary | ICD-10-CM | POA: Insufficient documentation

## 2021-09-24 ENCOUNTER — Telehealth: Payer: Self-pay

## 2021-09-24 NOTE — Telephone Encounter (Signed)
Patient informed.  Task completed. 

## 2021-09-24 NOTE — Telephone Encounter (Signed)
Scott Castro is an in-patient at [facility]. Patient would like you to send an order to [facility] for a hospital bed. He does not have a discharge date from  [place]. He has not been advised to follow up here at [facility].   I advised him to check with his Education officer, museum or MD at the facility for the hospital bed request. It may be premature at this point since he is still an in-patient. And Dr. Dagoberto Ligas may not be able to write for the request if he does not follow up here.   Call back phone [phone].  Please advise. Thank you

## 2021-10-05 DIAGNOSIS — R197 Diarrhea, unspecified: Secondary | ICD-10-CM | POA: Insufficient documentation

## 2021-10-13 ENCOUNTER — Other Ambulatory Visit: Payer: Self-pay

## 2021-10-13 ENCOUNTER — Encounter (HOSPITAL_COMMUNITY): Payer: Self-pay | Admitting: Emergency Medicine

## 2021-10-13 ENCOUNTER — Emergency Department (HOSPITAL_COMMUNITY): Payer: Medicare Other

## 2021-10-13 ENCOUNTER — Inpatient Hospital Stay (HOSPITAL_COMMUNITY)
Admission: EM | Admit: 2021-10-13 | Discharge: 2021-10-25 | DRG: 474 | Disposition: A | Discharge status: Hospice | Payer: Medicare Other | Source: Ambulatory Visit | Attending: Internal Medicine | Admitting: Internal Medicine

## 2021-10-13 ENCOUNTER — Ambulatory Visit (INDEPENDENT_AMBULATORY_CARE_PROVIDER_SITE_OTHER): Payer: Medicare Other | Admitting: Physician Assistant

## 2021-10-13 ENCOUNTER — Inpatient Hospital Stay (HOSPITAL_COMMUNITY): Payer: Medicare Other

## 2021-10-13 VITALS — BP 102/70 | HR 150 | Temp 97.7°F | Resp 18 | Ht 69.0 in | Wt 144.0 lb

## 2021-10-13 DIAGNOSIS — I4892 Unspecified atrial flutter: Secondary | ICD-10-CM | POA: Diagnosis not present

## 2021-10-13 DIAGNOSIS — Y835 Amputation of limb(s) as the cause of abnormal reaction of the patient, or of later complication, without mention of misadventure at the time of the procedure: Secondary | ICD-10-CM | POA: Diagnosis present

## 2021-10-13 DIAGNOSIS — N5089 Other specified disorders of the male genital organs: Secondary | ICD-10-CM | POA: Diagnosis not present

## 2021-10-13 DIAGNOSIS — I480 Paroxysmal atrial fibrillation: Secondary | ICD-10-CM

## 2021-10-13 DIAGNOSIS — N186 End stage renal disease: Secondary | ICD-10-CM | POA: Diagnosis not present

## 2021-10-13 DIAGNOSIS — D631 Anemia in chronic kidney disease: Secondary | ICD-10-CM | POA: Diagnosis not present

## 2021-10-13 DIAGNOSIS — I083 Combined rheumatic disorders of mitral, aortic and tricuspid valves: Secondary | ICD-10-CM | POA: Diagnosis not present

## 2021-10-13 DIAGNOSIS — Z515 Encounter for palliative care: Secondary | ICD-10-CM | POA: Diagnosis not present

## 2021-10-13 DIAGNOSIS — R1310 Dysphagia, unspecified: Secondary | ICD-10-CM

## 2021-10-13 DIAGNOSIS — R0609 Other forms of dyspnea: Secondary | ICD-10-CM | POA: Diagnosis not present

## 2021-10-13 DIAGNOSIS — Z789 Other specified health status: Secondary | ICD-10-CM | POA: Diagnosis not present

## 2021-10-13 DIAGNOSIS — D6959 Other secondary thrombocytopenia: Secondary | ICD-10-CM | POA: Diagnosis not present

## 2021-10-13 DIAGNOSIS — I4891 Unspecified atrial fibrillation: Secondary | ICD-10-CM

## 2021-10-13 DIAGNOSIS — I70223 Atherosclerosis of native arteries of extremities with rest pain, bilateral legs: Secondary | ICD-10-CM | POA: Diagnosis present

## 2021-10-13 DIAGNOSIS — I132 Hypertensive heart and chronic kidney disease with heart failure and with stage 5 chronic kidney disease, or end stage renal disease: Secondary | ICD-10-CM | POA: Diagnosis present

## 2021-10-13 DIAGNOSIS — T8189XA Other complications of procedures, not elsewhere classified, initial encounter: Secondary | ICD-10-CM

## 2021-10-13 DIAGNOSIS — I739 Peripheral vascular disease, unspecified: Secondary | ICD-10-CM | POA: Diagnosis present

## 2021-10-13 DIAGNOSIS — E43 Unspecified severe protein-calorie malnutrition: Secondary | ICD-10-CM | POA: Diagnosis not present

## 2021-10-13 DIAGNOSIS — Y830 Surgical operation with transplant of whole organ as the cause of abnormal reaction of the patient, or of later complication, without mention of misadventure at the time of the procedure: Secondary | ICD-10-CM | POA: Diagnosis present

## 2021-10-13 DIAGNOSIS — N2581 Secondary hyperparathyroidism of renal origin: Secondary | ICD-10-CM | POA: Diagnosis present

## 2021-10-13 DIAGNOSIS — Z8616 Personal history of COVID-19: Secondary | ICD-10-CM | POA: Diagnosis not present

## 2021-10-13 DIAGNOSIS — Z992 Dependence on renal dialysis: Secondary | ICD-10-CM | POA: Diagnosis not present

## 2021-10-13 DIAGNOSIS — T8781 Dehiscence of amputation stump: Secondary | ICD-10-CM | POA: Diagnosis not present

## 2021-10-13 DIAGNOSIS — K5900 Constipation, unspecified: Secondary | ICD-10-CM | POA: Diagnosis present

## 2021-10-13 DIAGNOSIS — R0600 Dyspnea, unspecified: Secondary | ICD-10-CM

## 2021-10-13 DIAGNOSIS — K6289 Other specified diseases of anus and rectum: Secondary | ICD-10-CM | POA: Diagnosis present

## 2021-10-13 DIAGNOSIS — Z89511 Acquired absence of right leg below knee: Secondary | ICD-10-CM

## 2021-10-13 DIAGNOSIS — T8743 Infection of amputation stump, right lower extremity: Principal | ICD-10-CM | POA: Diagnosis present

## 2021-10-13 DIAGNOSIS — I272 Pulmonary hypertension, unspecified: Secondary | ICD-10-CM | POA: Diagnosis not present

## 2021-10-13 DIAGNOSIS — I429 Cardiomyopathy, unspecified: Secondary | ICD-10-CM | POA: Diagnosis present

## 2021-10-13 DIAGNOSIS — I5082 Biventricular heart failure: Secondary | ICD-10-CM | POA: Diagnosis present

## 2021-10-13 DIAGNOSIS — K219 Gastro-esophageal reflux disease without esophagitis: Secondary | ICD-10-CM | POA: Diagnosis present

## 2021-10-13 DIAGNOSIS — Z66 Do not resuscitate: Secondary | ICD-10-CM | POA: Diagnosis not present

## 2021-10-13 DIAGNOSIS — I9589 Other hypotension: Secondary | ICD-10-CM | POA: Diagnosis not present

## 2021-10-13 DIAGNOSIS — Z833 Family history of diabetes mellitus: Secondary | ICD-10-CM

## 2021-10-13 DIAGNOSIS — R52 Pain, unspecified: Secondary | ICD-10-CM | POA: Diagnosis not present

## 2021-10-13 DIAGNOSIS — R64 Cachexia: Secondary | ICD-10-CM | POA: Diagnosis not present

## 2021-10-13 DIAGNOSIS — Z7189 Other specified counseling: Secondary | ICD-10-CM | POA: Diagnosis not present

## 2021-10-13 DIAGNOSIS — T8612 Kidney transplant failure: Secondary | ICD-10-CM | POA: Diagnosis present

## 2021-10-13 DIAGNOSIS — R531 Weakness: Secondary | ICD-10-CM | POA: Diagnosis not present

## 2021-10-13 DIAGNOSIS — E785 Hyperlipidemia, unspecified: Secondary | ICD-10-CM | POA: Diagnosis present

## 2021-10-13 DIAGNOSIS — I517 Cardiomegaly: Secondary | ICD-10-CM | POA: Diagnosis not present

## 2021-10-13 DIAGNOSIS — Z6821 Body mass index (BMI) 21.0-21.9, adult: Secondary | ICD-10-CM

## 2021-10-13 DIAGNOSIS — T8744 Infection of amputation stump, left lower extremity: Secondary | ICD-10-CM | POA: Diagnosis present

## 2021-10-13 DIAGNOSIS — T8789 Other complications of amputation stump: Secondary | ICD-10-CM | POA: Diagnosis not present

## 2021-10-13 DIAGNOSIS — Z8249 Family history of ischemic heart disease and other diseases of the circulatory system: Secondary | ICD-10-CM

## 2021-10-13 DIAGNOSIS — Z7901 Long term (current) use of anticoagulants: Secondary | ICD-10-CM

## 2021-10-13 DIAGNOSIS — J449 Chronic obstructive pulmonary disease, unspecified: Secondary | ICD-10-CM | POA: Diagnosis not present

## 2021-10-13 DIAGNOSIS — R791 Abnormal coagulation profile: Secondary | ICD-10-CM | POA: Diagnosis present

## 2021-10-13 DIAGNOSIS — Z79899 Other long term (current) drug therapy: Secondary | ICD-10-CM

## 2021-10-13 DIAGNOSIS — I5030 Unspecified diastolic (congestive) heart failure: Secondary | ICD-10-CM | POA: Diagnosis not present

## 2021-10-13 DIAGNOSIS — I5020 Unspecified systolic (congestive) heart failure: Secondary | ICD-10-CM | POA: Diagnosis present

## 2021-10-13 DIAGNOSIS — Z89612 Acquired absence of left leg above knee: Secondary | ICD-10-CM

## 2021-10-13 HISTORY — DX: Rheumatic fever without heart involvement: I00

## 2021-10-13 HISTORY — DX: Acquired absence of right leg above knee: Z89.611

## 2021-10-13 HISTORY — DX: End stage renal disease: N18.6

## 2021-10-13 HISTORY — DX: Nonrheumatic mitral (valve) insufficiency: I34.0

## 2021-10-13 HISTORY — DX: Peripheral vascular disease, unspecified: I73.9

## 2021-10-13 HISTORY — DX: Unspecified atrial fibrillation: I48.91

## 2021-10-13 HISTORY — DX: Essential (primary) hypertension: I10

## 2021-10-13 HISTORY — DX: Unspecified atrial flutter: I48.92

## 2021-10-13 HISTORY — DX: Hyperlipidemia, unspecified: E78.5

## 2021-10-13 HISTORY — DX: End stage renal disease: Z99.2

## 2021-10-13 HISTORY — DX: Atherosclerosis of native arteries of extremities with rest pain, bilateral legs: I70.223

## 2021-10-13 HISTORY — DX: Paroxysmal atrial fibrillation: I48.0

## 2021-10-13 HISTORY — DX: Rheumatic tricuspid insufficiency: I07.1

## 2021-10-13 LAB — COMPREHENSIVE METABOLIC PANEL
ALT: 26 U/L (ref 0–44)
AST: 31 U/L (ref 15–41)
Albumin: 2.2 g/dL — ABNORMAL LOW (ref 3.5–5.0)
Alkaline Phosphatase: 94 U/L (ref 38–126)
Anion gap: 19 — ABNORMAL HIGH (ref 5–15)
BUN: 23 mg/dL (ref 8–23)
CO2: 24 mmol/L (ref 22–32)
Calcium: 10.7 mg/dL — ABNORMAL HIGH (ref 8.9–10.3)
Chloride: 97 mmol/L — ABNORMAL LOW (ref 98–111)
Creatinine, Ser: 3.8 mg/dL — ABNORMAL HIGH (ref 0.61–1.24)
GFR, Estimated: 17 mL/min — ABNORMAL LOW (ref >60)
Glucose, Bld: 82 mg/dL (ref 70–99)
Potassium: 3.8 mmol/L (ref 3.5–5.1)
Sodium: 140 mmol/L (ref 135–145)
Total Bilirubin: 1 mg/dL (ref 0.3–1.2)
Total Protein: 6 g/dL — ABNORMAL LOW (ref 6.5–8.1)

## 2021-10-13 LAB — CBC WITH DIFFERENTIAL/PLATELET
Abs Immature Granulocytes: 0.04 10*3/uL (ref 0.00–0.07)
Basophils Absolute: 0 10*3/uL (ref 0.0–0.1)
Basophils Relative: 0 %
Eosinophils Absolute: 0 10*3/uL (ref 0.0–0.5)
Eosinophils Relative: 0 %
HCT: 38.1 % — ABNORMAL LOW (ref 39.0–52.0)
Hemoglobin: 12.2 g/dL — ABNORMAL LOW (ref 13.0–17.0)
Immature Granulocytes: 1 %
Lymphocytes Relative: 21 %
Lymphs Abs: 1.2 10*3/uL (ref 0.7–4.0)
MCH: 30.8 pg (ref 26.0–34.0)
MCHC: 32 g/dL (ref 30.0–36.0)
MCV: 96.2 fL (ref 80.0–100.0)
Monocytes Absolute: 0.6 10*3/uL (ref 0.1–1.0)
Monocytes Relative: 11 %
Neutro Abs: 4 10*3/uL (ref 1.7–7.7)
Neutrophils Relative %: 67 %
Platelets: 157 10*3/uL (ref 150–400)
RBC: 3.96 MIL/uL — ABNORMAL LOW (ref 4.22–5.81)
RDW: 18.3 % — ABNORMAL HIGH (ref 11.5–15.5)
WBC: 5.9 10*3/uL (ref 4.0–10.5)
nRBC: 0.3 % — ABNORMAL HIGH (ref 0.0–0.2)

## 2021-10-13 LAB — PROTIME-INR
INR: 1.6 — ABNORMAL HIGH (ref 0.8–1.2)
Prothrombin Time: 19.1 seconds — ABNORMAL HIGH (ref 11.4–15.2)

## 2021-10-13 LAB — APTT: aPTT: 34 seconds (ref 24–36)

## 2021-10-13 LAB — RESP PANEL BY RT-PCR (FLU A&B, COVID) ARPGX2
Influenza A by PCR: NEGATIVE
Influenza B by PCR: NEGATIVE
SARS Coronavirus 2 by RT PCR: NEGATIVE

## 2021-10-13 LAB — PHOSPHORUS: Phosphorus: 5 mg/dL — ABNORMAL HIGH (ref 2.5–4.6)

## 2021-10-13 LAB — C-REACTIVE PROTEIN: CRP: 3.3 mg/dL — ABNORMAL HIGH (ref <1.0)

## 2021-10-13 LAB — SEDIMENTATION RATE: Sed Rate: 5 mm/hr (ref 0–16)

## 2021-10-13 LAB — LACTIC ACID, PLASMA
Lactic Acid, Venous: 1.7 mmol/L (ref 0.5–1.9)
Lactic Acid, Venous: 1.3 mmol/L (ref 0.5–1.9)

## 2021-10-13 LAB — MAGNESIUM: Magnesium: 2 mg/dL (ref 1.7–2.4)

## 2021-10-13 IMAGING — MR MR [PERSON_NAME] LOW W/O CM*L*
6 series · 40 of 40 positions shown · non-contrast
Comparison: None available

CLINICAL DATA: Soft tissue infection suspected. Nonhealing wound on
left surgical site. Concern for infection.

EXAM:
MRI OF LOWER LEFT EXTREMITY WITHOUT CONTRAST
TECHNIQUE: Multiplanar, multisequence MR imaging of the left knee was
performed. No intravenous contrast was administered.

[Series 2: T1 · coronal · left · 5.0mm · 1.25mm/px · 7 of 36 slices shown]
[im 1/36]
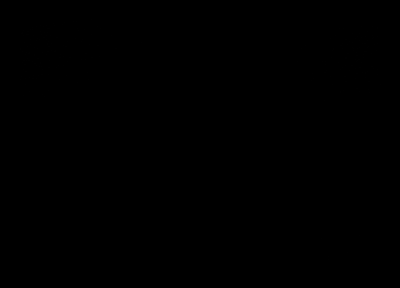
[im 6/36]
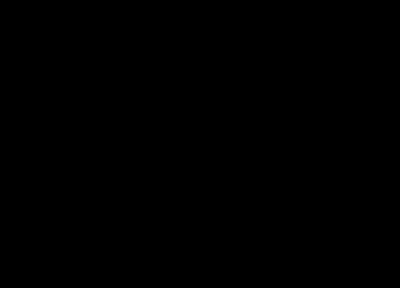
[im 12/36]
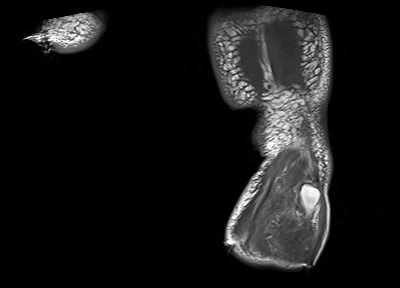
[im 18/36]
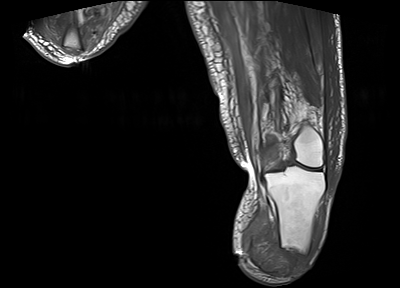
[im 24/36]
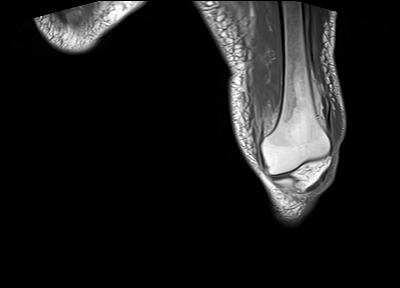
[im 30/36]
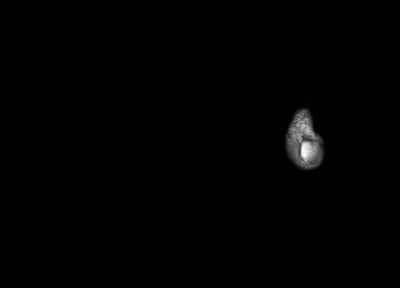
[im 36/36]
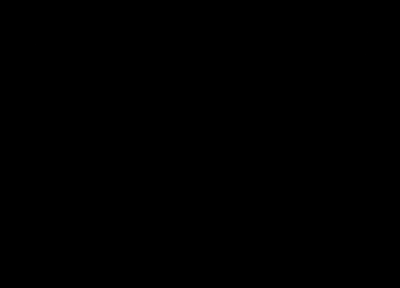

[Series 3: STIR · coronal · left · 5.0mm · 1.25mm/px · 7 of 36 slices shown]
[im 1/36]
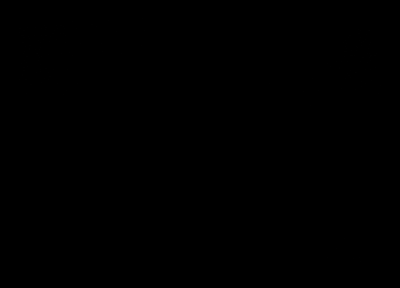
[im 6/36]
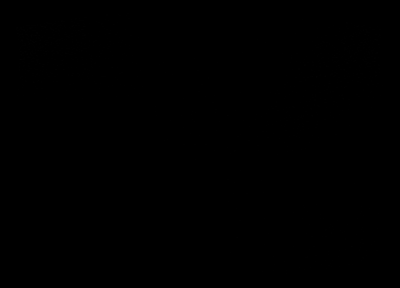
[im 12/36]
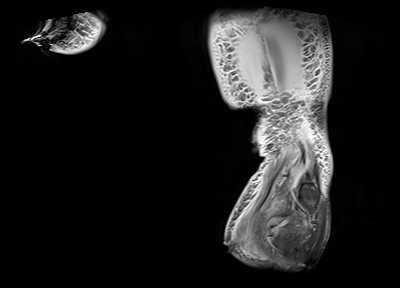
[im 18/36]
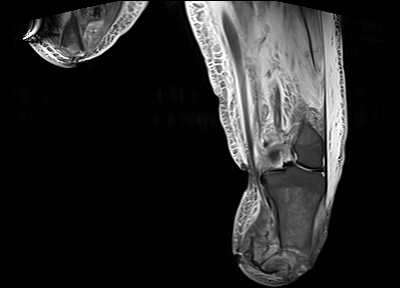
[im 24/36]
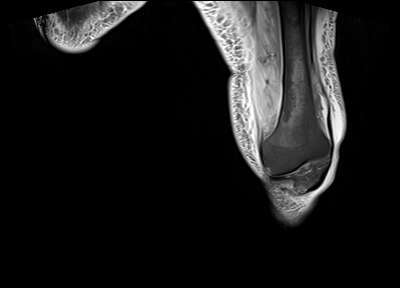
[im 30/36]
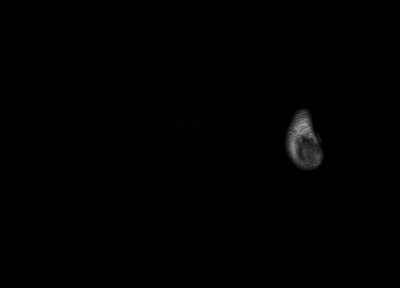
[im 36/36]
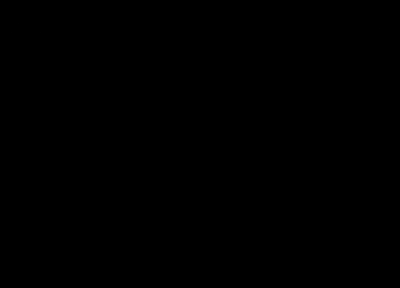

[Series 6: ax t1_comp · axial · left · 5.0mm · 0.74mm/px · z∈[-220,+134]mm · 11 of 60 slices shown]
[im 1/60]
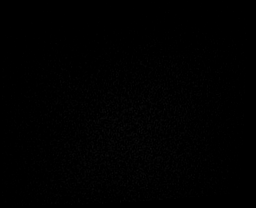
[im 6/60]
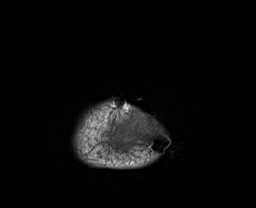
[im 12/60]
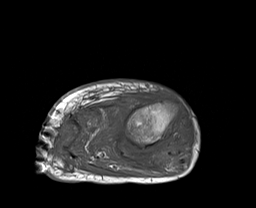
[im 18/60]
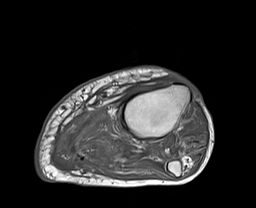
[im 24/60]
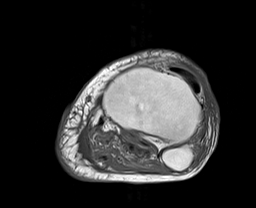
[im 30/60]
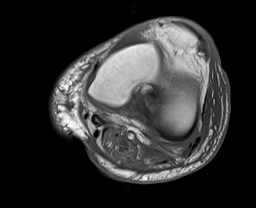
[im 36/60]
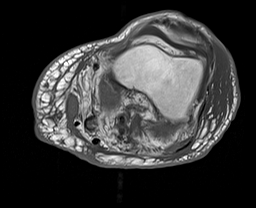
[im 42/60]
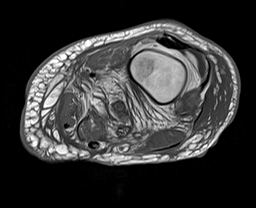
[im 48/60]
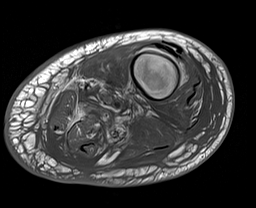
[im 54/60]
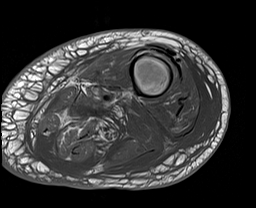
[im 60/60]
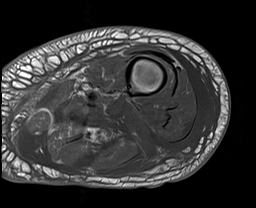

[Series 9: T2 · axial · left · 5.0mm · 0.87mm/px · z∈[-40,+134]mm · 5 of 30 slices shown (1 of 2)]
[im 1/30]
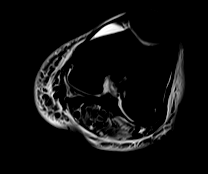
[im 8/30]
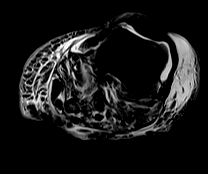
[im 15/30]
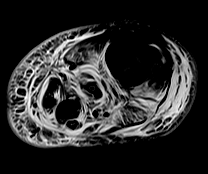
[im 22/30]
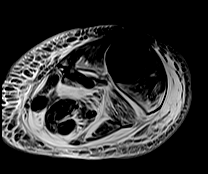
[im 30/30]
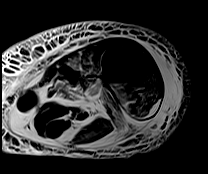

[Series 9: T2 · axial · left · 5.0mm · 0.87mm/px · z∈[-220,-46]mm · 5 of 29 slices shown (2 of 2)]
[im 1/29]
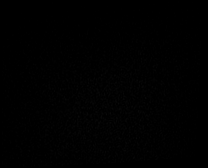
[im 8/29]
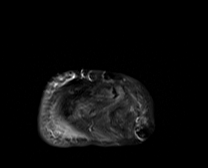
[im 15/29]
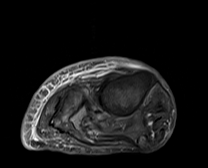
[im 22/29]
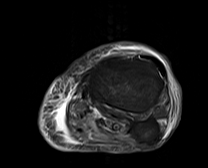
[im 29/29]
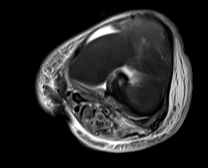

[Series 10: T2 fat-sat · sagittal · left · 4.0mm · 1.15mm/px · 5 of 30 slices shown]
[im 1/30]
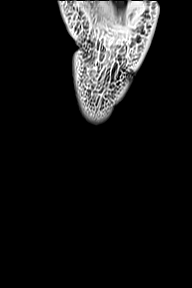
[im 8/30]
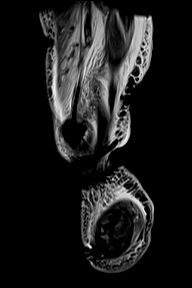
[im 15/30]
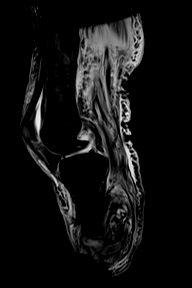
[im 22/30]
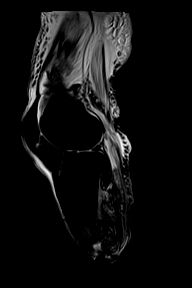
[im 30/30]
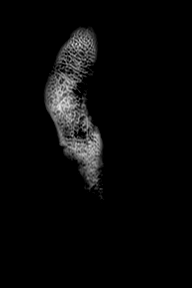

[40 of 40 positions shown; findings below may reference images not displayed]

FINDINGS: Bones/Joint/Cartilage

Postsurgical changes are noted of left below-the-knee amputation.
There is metallic artifact from likely surgical skin staples at the
distal anterior aspect of the amputation site. There is
heterogeneous predominantly decreased T1 and heterogeneous both
decreased and increased T2 signal within the soft tissues at the
distal aspect of the stump including the soleus musculature. There
is mild marrow edema within the distal aspect of the remaining
tibial diaphysis, very faint but possibly extending up to 4 cm
proximal to the distal amputation edge. There is no significant
cortication of the distal tibial amputation edge and this surgery
may be recent.

Ligaments

The ACL and PCL appear intact. The visualized portions of the medial
collateral ligament and fibular collateral ligament appear intact.

Muscles and Tendons

There is diffuse edema seen throughout the visualized posterior
thigh hamstring musculature.

Soft tissues

There is moderate edema and swelling of the subcutaneous fat of the
medial and anterolateral greater than posterolateral thigh and
proximal calf.

No walled-off fluid collection is seen.
IMPRESSION: 1. Left below-knee amputation. There is moderate subcutaneous fat
edema and swelling of the left leg. Heterogeneous signal within the
soft tissues distal to the amputation stump are suspicious for an
infectious/inflammatory process, possible cellulitis. There is
subtle marrow edema seen within the left tibial diaphyseal
amputation stump, raising suspicion for osteomyelitis. The distal
osseous amputation site does not appear to be well corticated,
consistent with fairly recent surgery.

2. No walled-off fluid collection is seen.

## 2021-10-13 IMAGING — DX DG CHEST 1V PORT
1 series · 1 of 1 positions shown · non-contrast
Comparison: [DATE]

CLINICAL DATA: Possible sepsis

EXAM:
PORTABLE CHEST 1 VIEW

[chest ap]
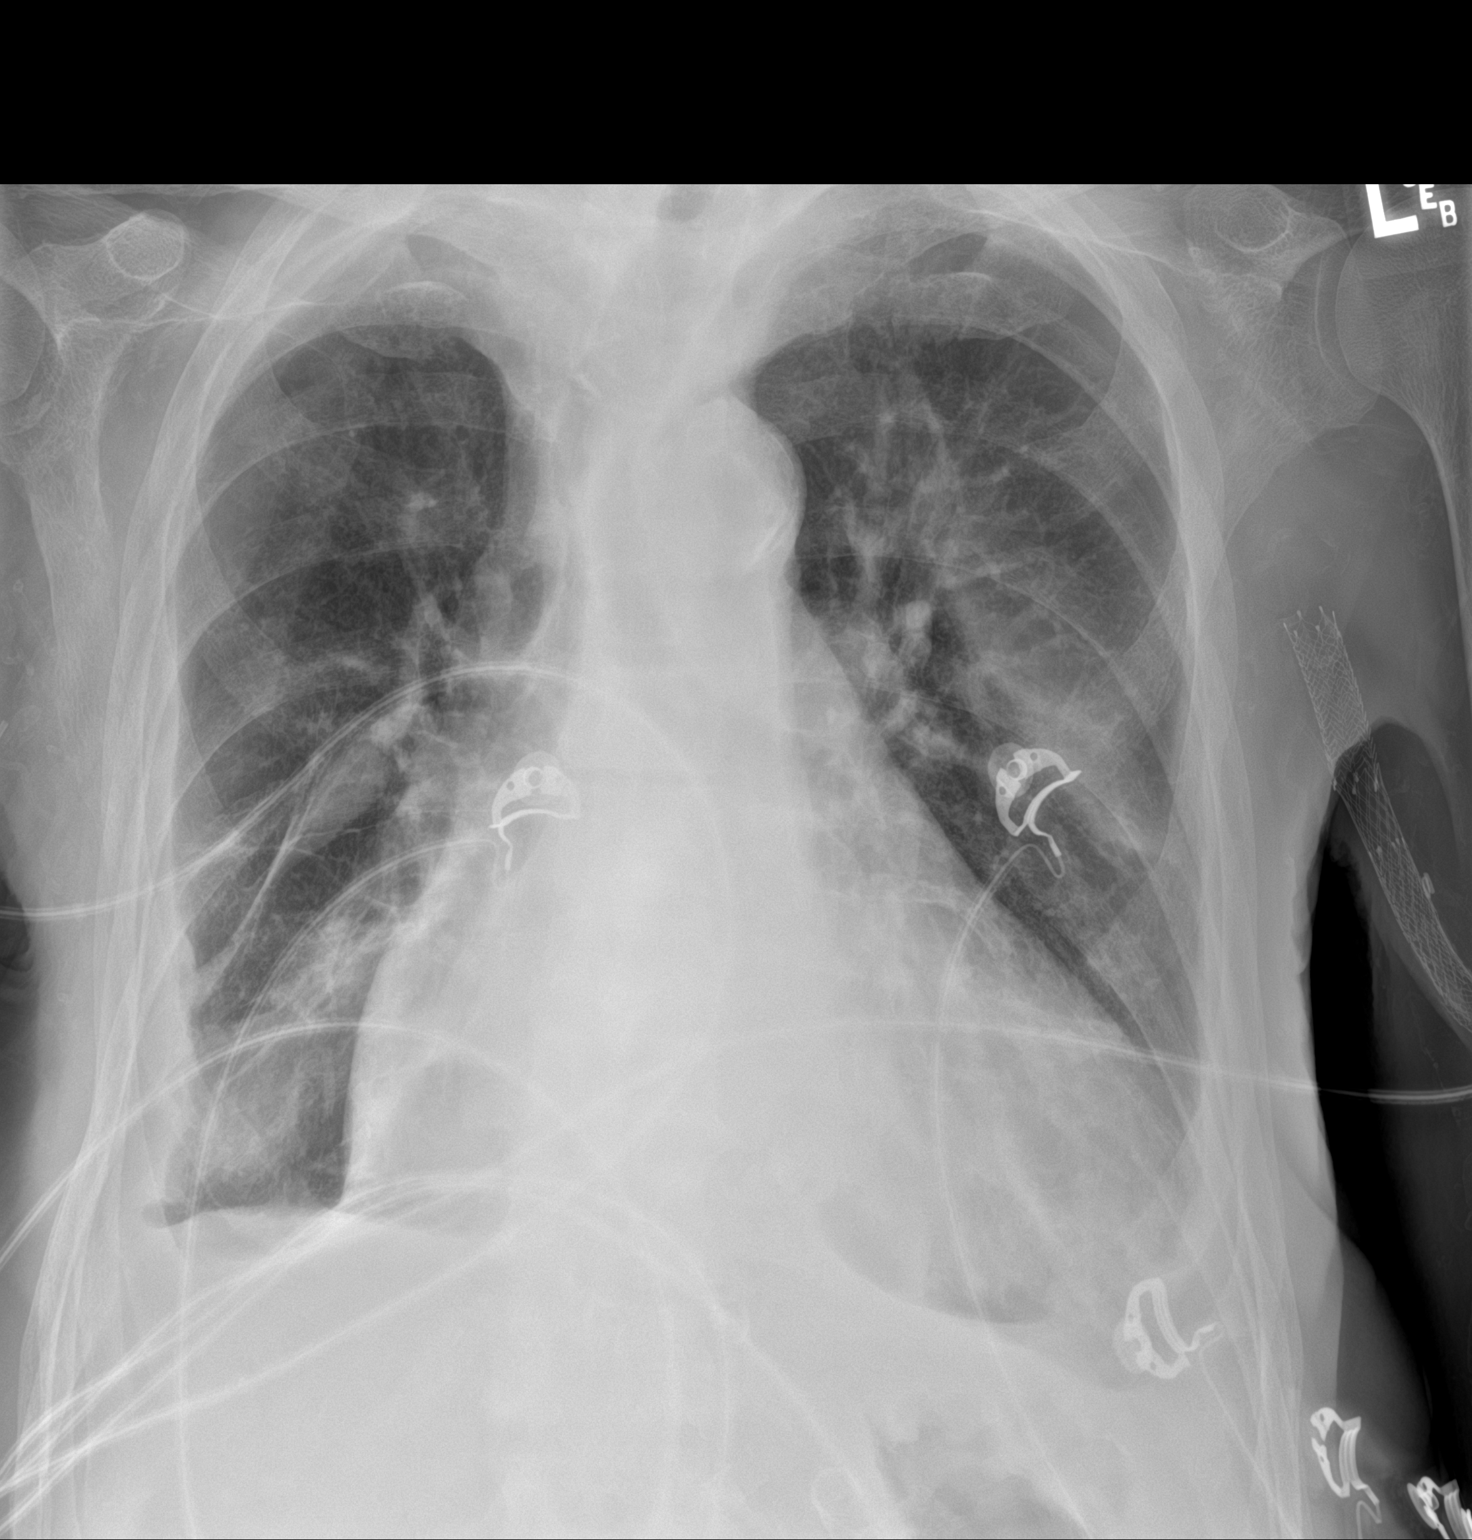

[1 of 1 positions shown; findings below may reference images not displayed]

FINDINGS: Transverse diameter of heart is increased. Small bilateral pleural
effusions are seen. Central pulmonary vessels are less prominent.
There is hazy radiopacity in the left parahilar region and right
lower lung fields. There is no pneumothorax. Vascular stents are
seen in the left upper arm.
IMPRESSION: Cardiomegaly. Central pulmonary vessels are less prominent. There
are no signs of alveolar pulmonary edema. Small bilateral pleural
effusions.

There are faint linear densities in the left parahilar region and
right lower lung fields suggesting loculated pleural effusions or
atelectasis/pneumonia.

## 2021-10-13 MED ORDER — GABAPENTIN 100 MG PO CAPS
100.0000 mg | ORAL_CAPSULE | Freq: Two times a day (BID) | ORAL | Status: DC
  Administered 2021-10-13 – 2021-10-23 (×19): 100 mg via ORAL
  Filled 2021-10-13 (×19): qty 1

## 2021-10-13 MED ORDER — LACTATED RINGERS IV SOLN: INTRAVENOUS | Status: AC

## 2021-10-13 MED ORDER — SERTRALINE HCL 100 MG PO TABS
100.0000 mg | ORAL_TABLET | Freq: Every day | ORAL | Status: DC
  Administered 2021-10-13 – 2021-10-23 (×10): 100 mg via ORAL
  Filled 2021-10-13 (×10): qty 1

## 2021-10-13 MED ORDER — ACETAMINOPHEN 650 MG RE SUPP: 650.0000 mg | Freq: Four times a day (QID) | RECTAL | Status: DC | PRN

## 2021-10-13 MED ORDER — DILTIAZEM HCL-DEXTROSE 125-5 MG/125ML-% IV SOLN (PREMIX)
5.0000 mg/h | INTRAVENOUS | Status: DC
  Administered 2021-10-13: 15 mg/h via INTRAVENOUS
  Administered 2021-10-13: 5 mg/h via INTRAVENOUS
  Administered 2021-10-14: 10 mg/h via INTRAVENOUS
  Filled 2021-10-13 (×5): qty 125

## 2021-10-13 MED ORDER — ACETAMINOPHEN 325 MG PO TABS
650.0000 mg | ORAL_TABLET | Freq: Four times a day (QID) | ORAL | Status: DC | PRN
  Administered 2021-10-14 – 2021-10-19 (×5): 650 mg via ORAL
  Filled 2021-10-13 (×6): qty 2

## 2021-10-13 MED ORDER — LACTATED RINGERS IV BOLUS (SEPSIS)
1000.0000 mL | Freq: Once | INTRAVENOUS | Status: AC
  Administered 2021-10-13: 1000 mL via INTRAVENOUS

## 2021-10-13 MED ORDER — VANCOMYCIN HCL 1500 MG/300ML IV SOLN
1500.0000 mg | Freq: Once | INTRAVENOUS | Status: AC
  Administered 2021-10-13: 1500 mg via INTRAVENOUS
  Filled 2021-10-13: qty 300

## 2021-10-13 MED ORDER — ATORVASTATIN CALCIUM 10 MG PO TABS
10.0000 mg | ORAL_TABLET | Freq: Every day | ORAL | Status: DC
  Administered 2021-10-13 – 2021-10-22 (×10): 10 mg via ORAL
  Filled 2021-10-13 (×10): qty 1

## 2021-10-13 MED ORDER — ONDANSETRON HCL 4 MG PO TABS: 4.0000 mg | ORAL_TABLET | Freq: Four times a day (QID) | ORAL | Status: DC | PRN

## 2021-10-13 MED ORDER — VANCOMYCIN VARIABLE DOSE PER UNSTABLE RENAL FUNCTION (PHARMACIST DOSING): Status: DC

## 2021-10-13 MED ORDER — SENNOSIDES-DOCUSATE SODIUM 8.6-50 MG PO TABS: 1.0000 | ORAL_TABLET | Freq: Every evening | ORAL | Status: DC | PRN

## 2021-10-13 MED ORDER — ONDANSETRON HCL 4 MG/2ML IJ SOLN
4.0000 mg | Freq: Four times a day (QID) | INTRAMUSCULAR | Status: DC | PRN
Start: 2021-10-13 — End: 2021-10-25
  Administered 2021-10-21: 4 mg via INTRAVENOUS
  Filled 2021-10-13: qty 2

## 2021-10-13 MED ORDER — SODIUM CHLORIDE 0.9 % IV SOLN
1.0000 g | INTRAVENOUS | Status: DC
  Administered 2021-10-14 – 2021-10-18 (×5): 1 g via INTRAVENOUS
  Filled 2021-10-13 (×6): qty 1

## 2021-10-13 MED ORDER — SODIUM CHLORIDE 0.9 % IV SOLN
2.0000 g | Freq: Once | INTRAVENOUS | Status: AC
  Administered 2021-10-13: 2 g via INTRAVENOUS
  Filled 2021-10-13: qty 2

## 2021-10-13 MED ORDER — DILTIAZEM LOAD VIA INFUSION
20.0000 mg | Freq: Once | INTRAVENOUS | Status: AC
  Administered 2021-10-13: 20 mg via INTRAVENOUS
  Filled 2021-10-13: qty 20

## 2021-10-13 MED ORDER — SIMETHICONE 80 MG PO CHEW
80.0000 mg | CHEWABLE_TABLET | Freq: Four times a day (QID) | ORAL | Status: DC | PRN
  Administered 2021-10-14 (×2): 80 mg via ORAL
  Filled 2021-10-13 (×2): qty 1

## 2021-10-13 NOTE — Hospital Course (Addendum)
#  Non-healing wound on left lower extremity #Cellulitis #Left above the knee amputation   #Atrial fibrillation   #ESRD  3/13: Feeling better today, was seen eating breakfast No SOB, has some trouble swallowing? Feels lightheaded, started about an hr ago  No chest pain, palpitations    BTW: Scheduled tylenol 1000 mg tid Changed dilaudid to 1 mg q4h prn PO

## 2021-10-13 NOTE — ED Provider Notes (Signed)
[facility] Provider Note   CSN: [id number] Arrival date & time: 10/13/21  1239     History  Chief Complaint  Patient presents with   Wound Check    Scott Castro is a 66 y.o. male.  Pt is a 66 yo male who has a hx of dialysis (MWF) s/p failed renal transplant, barretts esophagus, paf on eliquis, and critical lumb ischemia.  Pt was admitted on 12/28 of RLE critical limb ischemia.  He had a BKA on 12/20 then had to go back to to the OR on 2/1 for a revision of the right bka to AKA.  He also required a left bka on 1/25.  He initially went to inpatient rehab then to [place].  Pt had been getting IV Meropenem at dialysis through a picc line.  Pt presented to the vascular office today for f/u.  He has felt more weak.  He has been having a hard time swallowing his pills, so he's not been taking them.  His wound is not healing and he is tachycardic and has borderline blood pressures.  He is sent back to the ED for admission.      Home Medications Prior to Admission medications   Medication Sig Start Date End Date Taking? Authorizing Provider  acetaminophen (TYLENOL) 325 MG tablet Take 2 tablets (650 mg total) by mouth every 6 (six) hours as needed for mild pain or moderate pain. 09/16/21   Hongalgi, Lenis Dickinson, MD  albuterol (VENTOLIN HFA) 108 (90 Base) MCG/ACT inhaler Inhale 2 puffs into the lungs every 6 (six) hours as needed for wheezing or shortness of breath. 07/08/21   Enzo Bi, MD  apixaban (ELIQUIS) 5 MG TABS tablet Take 1 tablet (5 mg total) by mouth 2 (two) times daily. 07/22/21   Sharen Hones, MD  atorvastatin (LIPITOR) 10 MG tablet Take 1 tablet (10 mg total) by mouth at bedtime. 07/22/21   Sharen Hones, MD  bisacodyl (DULCOLAX) 10 MG suppository Place 1 suppository (10 mg total) rectally daily as needed for moderate constipation. 09/16/21   Hongalgi, Lenis Dickinson, MD  diltiazem (CARDIZEM CD) 120 MG 24 hr capsule Take 1 capsule (120 mg total) by  mouth daily. Hold for sbp < 100 08/12/21   Antonieta Pert, MD  gabapentin (NEURONTIN) 100 MG capsule Take 1 capsule (100 mg total) by mouth 2 (two) times daily. 08/12/21   Antonieta Pert, MD  metoprolol tartrate (LOPRESSOR) 25 MG tablet Take 1.5 tablets (37.5 mg total) by mouth 2 (two) times daily. 09/16/21   Hongalgi, Lenis Dickinson, MD  midodrine (PROAMATINE) 10 MG tablet Take 1 tablet (10 mg total) by mouth 3 (three) times daily with meals. 09/16/21   Hongalgi, Lenis Dickinson, MD  multivitamin (RENA-VIT) TABS tablet Take 1 tablet by mouth at bedtime. 09/16/21   Hongalgi, Lenis Dickinson, MD  Nutritional Supplements (FEEDING SUPPLEMENT, NEPRO CARB STEADY,) LIQD Take 237 mLs by mouth 3 (three) times daily between meals. 09/16/21   Hongalgi, Lenis Dickinson, MD  oxyCODONE (OXY IR/ROXICODONE) 5 MG immediate release tablet Take 1 tablet (5 mg total) by mouth every 6 (six) hours as needed for severe pain. 09/16/21   Hongalgi, Lenis Dickinson, MD  pantoprazole (PROTONIX) 40 MG tablet Take 40 mg by mouth daily. 03/27/19   [provider]  polyethylene glycol (MIRALAX / GLYCOLAX) 17 g packet Take 17 g by mouth daily. 09/16/21   Hongalgi, Lenis Dickinson, MD  senna-docusate (SENOKOT-S) 8.6-50 MG tablet Take 2 tablets by mouth  2 (two) times daily. 08/12/21   Antonieta Pert, MD  sertraline (ZOLOFT) 100 MG tablet Take 1 tablet (100 mg total) by mouth daily. 09/17/21   Hongalgi, Lenis Dickinson, MD  sevelamer carbonate (RENVELA) 800 MG tablet Take 2400 mg by mouth three times daily with meals, 800 mg 3 times daily as needed with snacks 09/16/21   Hongalgi, Lenis Dickinson, MD  witch hazel-glycerin (TUCKS) pad Apply topically as needed for itching. 09/16/21   Hongalgi, Lenis Dickinson, MD      Allergies    Patient has no known allergies.    Review of Systems   Review of Systems  Cardiovascular:  Positive for palpitations.  Gastrointestinal:        Dysphagia  Neurological:  Positive for weakness.  All other systems reviewed and are negative.  Physical Exam Updated Vital Signs BP (!) 114/94     Pulse (!) 123    Temp 98.5 F (36.9 C) (Oral)    Resp (!) 23    SpO2 95%  Physical Exam Vitals and nursing note reviewed.  Constitutional:      Appearance: He is underweight. He is ill-appearing.  HENT:     Head: Normocephalic and atraumatic.     Right Ear: External ear normal.     Left Ear: External ear normal.     Nose: Nose normal.     Mouth/Throat:     Mouth: Mucous membranes are dry.  Eyes:     Extraocular Movements: Extraocular movements intact.     Conjunctiva/sclera: Conjunctivae normal.     Pupils: Pupils are equal, round, and reactive to light.  Cardiovascular:     Rate and Rhythm: Tachycardia present. Rhythm irregular.     Pulses: Normal pulses.     Heart sounds: Normal heart sounds.  Pulmonary:     Effort: Pulmonary effort is normal.     Breath sounds: Normal breath sounds.  Abdominal:     General: Abdomen is flat. Bowel sounds are normal.     Palpations: Abdomen is soft.  Musculoskeletal:     Cervical back: Normal range of motion and neck supple.     Comments: Left upper arm aneurysmal AVF.  + thrill Right AKA with slowly healing wounds Left BKA with nonhealing wound.  See pictures.  Skin:    General: Skin is warm.     Capillary Refill: Capillary refill takes less than 2 seconds.  Neurological:     General: No focal deficit present.     Mental Status: He is alert and oriented to person, place, and time.  Psychiatric:        Mood and Affect: Mood normal.        Behavior: Behavior normal.     ED Results / Procedures / Treatments   Labs (all labs ordered are listed, but only abnormal results are displayed) Labs Reviewed  COMPREHENSIVE METABOLIC PANEL - Abnormal; Notable for the following components:      Result Value   Chloride 97 (*)    Creatinine, Ser 3.80 (*)    Calcium 10.7 (*)    Total Protein 6.0 (*)    Albumin 2.2 (*)    GFR, Estimated 17 (*)    Anion gap 19 (*)    All other components within normal limits  CBC WITH DIFFERENTIAL/PLATELET -  Abnormal; Notable for the following components:   RBC 3.96 (*)    Hemoglobin 12.2 (*)    HCT 38.1 (*)    RDW 18.3 (*)    nRBC 0.3 (*)  All other components within normal limits  PROTIME-INR - Abnormal; Notable for the following components:   Prothrombin Time 19.1 (*)    INR 1.6 (*)    All other components within normal limits  RESP PANEL BY RT-PCR (FLU A&B, COVID) ARPGX2  CULTURE, BLOOD (ROUTINE X 2)  CULTURE, BLOOD (ROUTINE X 2)  URINE CULTURE  LACTIC ACID, PLASMA  APTT  LACTIC ACID, PLASMA  URINALYSIS, ROUTINE W REFLEX MICROSCOPIC    EKG None  Radiology DG Chest Port 1 View  Result Date: 10/13/2021 CLINICAL DATA:  Possible sepsis EXAM: PORTABLE CHEST 1 VIEW COMPARISON:  08/31/2021 FINDINGS: Transverse diameter of heart is increased. Small bilateral pleural effusions are seen. Central pulmonary vessels are less prominent. There is hazy radiopacity in the left parahilar region and right lower lung fields. There is no pneumothorax. Vascular stents are seen in the left upper arm. IMPRESSION: Cardiomegaly. Central pulmonary vessels are less prominent. There are no signs of alveolar pulmonary edema. Small bilateral pleural effusions. There are faint linear densities in the left parahilar region and right lower lung fields suggesting loculated pleural effusions or atelectasis/pneumonia. Electronically Signed   By: Elmer Picker M.D.   On: 10/13/2021 13:10    Procedures Procedures    Medications Ordered in ED Medications  lactated ringers infusion (has no administration in time range)  lactated ringers bolus 1,000 mL (1,000 mLs Intravenous New Bag/Given 10/13/21 1311)    And  lactated ringers bolus 1,000 mL (has no administration in time range)  diltiazem (CARDIZEM) 1 mg/mL load via infusion 20 mg (20 mg Intravenous Bolus from Bag 10/13/21 1340)    And  diltiazem (CARDIZEM) 125 mg in dextrose 5% 125 mL (1 mg/mL) infusion (5 mg/hr Intravenous New Bag/Given 10/13/21 1340)   vancomycin (VANCOREADY) IVPB 1500 mg/300 mL (has no administration in time range)  vancomycin variable dose per unstable renal function (pharmacist dosing) (has no administration in time range)  ceFEPIme (MAXIPIME) 1 g in sodium chloride 0.9 % 100 mL IVPB (has no administration in time range)  ceFEPIme (MAXIPIME) 2 g in sodium chloride 0.9 % 100 mL IVPB (2 g Intravenous New Bag/Given 10/13/21 1337)    ED Course/ Medical Decision Making/ A&P                           Medical Decision Making Amount and/or Complexity of Data Reviewed Labs: ordered. Radiology: ordered. ECG/medicine tests: ordered.  Risk Prescription drug management. Decision regarding hospitalization.   This patient presents to the ED for concern of nonhealing wound, this involves an extensive number of treatment options, and is a complaint that carries with it a high risk of complications and morbidity.  The differential diagnosis includes sepsis, afib with rvr   Co morbidities that complicate the patient evaluation  dialysis (MWF) s/p failed renal transplant, barretts esophagus, paf on eliquis, and critical lumb ischemia   Additional history obtained:  Additional history obtained from epic chart review External records from outside source obtained and reviewed including EMS report   Lab Tests:  I Ordered, and personally interpreted labs.  The pertinent results include:  cbc with chronic anemia (hgb 12.2), CMP with CKD (Cr of 3.8 and K nl), lactic 1.7, UA pending.  Covid/flu neg.   Imaging Studies ordered:  I ordered imaging studies including cxr  I independently visualized and interpreted imaging which showed    IMPRESSION:  Cardiomegaly. Central pulmonary vessels are less prominent. There  are no signs of  alveolar pulmonary edema. Small bilateral pleural  effusions.     There are faint linear densities in the left parahilar region and  right lower lung fields suggesting loculated pleural effusions or   atelectasis/pneumonia.   I agree with the radiologist interpretation   Cardiac Monitoring:  The patient was maintained on a cardiac monitor.  I personally viewed and interpreted the cardiac monitored which showed an underlying rhythm of: afib with rvr   Medicines ordered and prescription drug management:  I ordered medication including ivfs, vanc, cefepime  for nonhealing wound  Reevaluation of the patient after these medicines showed that the patient improved I have reviewed the patients home medicines and have made adjustments as needed    Critical Interventions:  Code sepsis   Consultations Obtained:  I requested consultation with the pharmacist,  and discussed lab and imaging findings as well as pertinent plan - they recommend: vanc/cefepime Pt d/w IMTS for admission.   Problem List / ED Course:  Non healing wound:  Pt has been on Meropenem, so I spoke with the pharmacist.  She thought that vanc and a cefepime would be best. Sepsis:  code sepsis called. Afib with rvr:  pt started on cardizem which has helped rate.  He is on Eliquis. Dysphagia:  Pt may need a swallow eval while here. ESRD on HD:  Stable now.  Will need dialysis during admission.   Reevaluation:  After the interventions noted above, I reevaluated the patient and found that they have :improved   Social Determinants of Health:  Lives at snf   Dispostion:  After consideration of the diagnostic results and the patients response to treatment, I feel that the patent would benefit from admission.    CHA2DS2/VAS Stroke Risk Points   3    Points Metrics  0 Has Congestive Heart Failure:  No    Current as of 21 minutes ago  \1 Has Vascular Disease:  yes  Current as of 21 minutes ago  1 Has Hypertension:  Yes    Current as of 21 minutes ago  1 Age:  8    Current as of 21 minutes ago  0 Has Diabetes:  No    Current as of 21 minutes ago  0 Had Stroke:  No  Had TIA:  No  Had Thromboembolism:  No     Current as of 21 minutes ago  0 Male:  No    Current as of 21 minutes ago     CRITICAL CARE Performed by: Isla Pence   Total critical care time: 30 minutes  Critical care time was exclusive of separately billable procedures and treating other patients.  Critical care was necessary to treat or prevent imminent or life-threatening deterioration.  Critical care was time spent personally by me on the following activities: development of treatment plan with patient and/or surrogate as well as nursing, discussions with consultants, evaluation of patient's response to treatment, examination of patient, obtaining history from patient or surrogate, ordering and performing treatments and interventions, ordering and review of laboratory studies, ordering and review of radiographic studies, pulse oximetry and re-evaluation of patient's condition.              Final Clinical Impression(s) / ED Diagnoses Final diagnoses:  Non-healing surgical wound, initial encounter  Atrial fibrillation with RVR ([place])  ESRD on hemodialysis ([place])  Dysphagia, unspecified type  On apixaban therapy    Rx / DC Orders ED Discharge Orders     None  Isla Pence, MD 10/13/21 [phone]

## 2021-10-13 NOTE — Sepsis Progress Note (Signed)
Sepsis protocol monitored by eLink 

## 2021-10-13 NOTE — ED Notes (Signed)
Patient with history of CKD on dialysis states he does not make urine. ?

## 2021-10-13 NOTE — H&P (Addendum)
Date: 10/13/2021               Patient Name:  Scott Castro MRN: [id number]  DOB: 1956/05/25 Age / Sex: 66 y.o., male   PCP: Franciso Bend, MD              Medical Service: Internal Medicine Teaching Service              Attending Physician: Dr. Aldine Contes, MD    First Contact: Nelva Nay, MS3 Pager: [phone]  Second Contact: Dr. Marlou Sa Pager: [phone]  Third Contact Dr. Coy Saunas Pager: [phone]       After Hours (After 5p/  First Contact Pager: [phone]  weekends / holidays): Second Contact Pager: [phone]   Chief Complaint: Non-healing surgical wound, tachycardia  History of Present Illness: Scott Castro is a 66 year old male with a past medical history of paroxysmal atrial fibrillation on eliquis, ESRD on HD MWF (19 years) s/p failed kidney transplant, Barrett's esophagus, and critical limb ischemia leading to bilateral BKA and R AKA, who was sent by vascular surgery from the office to the ED today with a non-healing wound on his L surgical site and concern for infection. He was seen by vascular surgery in the office today and was found to have tachycardia into the 170s and hypotensive. He denies fever or chills. He says that he understands the plan for revision of his L BKA to AKA and states that he had similar issues with non-healing with his R BKA. He reports that his L leg got darker before it required amputation.   He states that every 10 mins, he passes gas and sometimes it causes him rectal discomfort. He describes it as sharp pains and pressure. He reports needing to wear a diaper due to occasional leakage of stool. He has not had a solid BM in one week. Has not noticed any changes with food intake. States this has been going on 2-3 months.   He reports difficulty swallowing pills at [facility]) and is unable to identify all of his medications because they are managed by the SNF. He just takes the medications that are put in front of him. He says that he is given  applesauce to help with swallowing pills, but that it is not helpful.  Due to ESRD, he goes to dialysis MWF. He states that this take him a substantial amount of time due to waiting on transportation and that he has difficulty with moving around. He reports needing help with positioning. He reports that he does not make urine.  Meds: Current Facility-Administered Medications  Medication Dose Route Frequency Provider Last Rate Last Admin   acetaminophen (TYLENOL) tablet 650 mg  650 mg Oral Q6H PRN Lacinda Axon, MD       Or   acetaminophen (TYLENOL) suppository 650 mg  650 mg Rectal Q6H PRN Lacinda Axon, MD       [START ON 10/14/2021] ceFEPIme (MAXIPIME) 1 g in sodium chloride 0.9 % 100 mL IVPB  1 g Intravenous Q24H Lacinda Axon, MD       diltiazem (CARDIZEM) 125 mg in dextrose 5% 125 mL (1 mg/mL) infusion  5-15 mg/hr Intravenous Continuous Lacinda Axon, MD 15 mL/hr at 10/13/21 1616 15 mg/hr at 10/13/21 1616   lactated ringers infusion   Intravenous Continuous Lacinda Axon, MD       ondansetron [facility]) tablet 4 mg  4 mg Oral Q6H PRN Lacinda Axon, MD  Or   ondansetron (ZOFRAN) injection 4 mg  4 mg Intravenous Q6H PRN Sharde Gover, Charisse March, MD       senna-docusate (Senokot-S) tablet 1 tablet  1 tablet Oral QHS PRN Lacinda Axon, MD       vancomycin variable dose per unstable renal function (pharmacist dosing)   Does not apply See admin instructions Lacinda Axon, MD       Current Outpatient Medications  Medication Sig Dispense Refill   acetaminophen (TYLENOL) 325 MG tablet Take 2 tablets (650 mg total) by mouth every 6 (six) hours as needed for mild pain or moderate pain.     albuterol (VENTOLIN HFA) 108 (90 Base) MCG/ACT inhaler Inhale 2 puffs into the lungs every 6 (six) hours as needed for wheezing or shortness of breath.     apixaban (ELIQUIS) 5 MG TABS tablet Take 1 tablet (5 mg total) by mouth 2 (two) times daily. 60 tablet 0    atorvastatin (LIPITOR) 10 MG tablet Take 1 tablet (10 mg total) by mouth at bedtime. 30 tablet 0   bisacodyl (DULCOLAX) 10 MG suppository Place 1 suppository (10 mg total) rectally daily as needed for moderate constipation.     diltiazem (CARDIZEM CD) 120 MG 24 hr capsule Take 1 capsule (120 mg total) by mouth daily. Hold for sbp < 100     gabapentin (NEURONTIN) 100 MG capsule Take 1 capsule (100 mg total) by mouth 2 (two) times daily.     metoprolol tartrate (LOPRESSOR) 25 MG tablet Take 1.5 tablets (37.5 mg total) by mouth 2 (two) times daily.     midodrine (PROAMATINE) 10 MG tablet Take 1 tablet (10 mg total) by mouth 3 (three) times daily with meals.     multivitamin (RENA-VIT) TABS tablet Take 1 tablet by mouth at bedtime.     Nutritional Supplements (FEEDING SUPPLEMENT, NEPRO CARB STEADY,) LIQD Take 237 mLs by mouth 3 (three) times daily between meals.     oxyCODONE (OXY IR/ROXICODONE) 5 MG immediate release tablet Take 1 tablet (5 mg total) by mouth every 6 (six) hours as needed for severe pain. 10 tablet 0   pantoprazole (PROTONIX) 40 MG tablet Take 40 mg by mouth daily.     polyethylene glycol (MIRALAX / GLYCOLAX) 17 g packet Take 17 g by mouth daily.     senna-docusate (SENOKOT-S) 8.6-50 MG tablet Take 2 tablets by mouth 2 (two) times daily.     sertraline (ZOLOFT) 100 MG tablet Take 1 tablet (100 mg total) by mouth daily.     sevelamer carbonate (RENVELA) 800 MG tablet Take 2400 mg by mouth three times daily with meals, 800 mg 3 times daily as needed with snacks     witch hazel-glycerin (TUCKS) pad Apply topically as needed for itching.     No outpatient medications have been marked as taking for the 10/13/21 encounter [facility] Encounter).    Allergies: Allergies as of 10/13/2021   (No Known Allergies)   Past Medical History:  Diagnosis Date   Barrett's esophagus    Chronic kidney disease    CHRONIC\   Glomerulosclerosis, focal    Hyperparathyroidism due to renal insufficiency  (HCC)   Rheumatic fever  Past Surgical History:  Procedure Laterality Date   ABDOMINAL AORTOGRAM W/LOWER EXTREMITY N/A 08/28/2021   Procedure: ABDOMINAL AORTOGRAM W/LOWER EXTREMITY;  Surgeon: Cherre Robins, MD;  Location: [place] [facility];  Service: Cardiovascular;  Laterality: N/A;   AMPUTATION Right 08/07/2021   Procedure: AMPUTATION BELOW KNEE;  Surgeon:  Algernon Huxley, MD;  Location: [facility] ORS;  Service: Vascular;  Laterality: Right;   AMPUTATION Left 09/02/2021   Procedure: LEFT BELOW KNEE AMPUTATION;  Surgeon: Serafina Mitchell, MD;  Location: [place];  Service: Vascular;  Laterality: Left;   AMPUTATION Right 09/09/2021   Procedure: RIGHT AMPUTATION ABOVE KNEE;  Surgeon: Cherre Robins, MD;  Location: [place];  Service: Vascular;  Laterality: Right;   AV FISTULA PLACEMENT     COLONOSCOPY N/A 03/22/2016   Procedure: COLONOSCOPY;  Surgeon: Manya Silvas, MD;  Location: [facility] ENDOSCOPY;  Service: Endoscopy;  Laterality: N/A;   DG AV DIALYSIS GRAFT DECLOT OR     ESOPHAGOGASTRODUODENOSCOPY (EGD) WITH PROPOFOL  03/22/2016   Procedure: ESOPHAGOGASTRODUODENOSCOPY (EGD) WITH PROPOFOL;  Surgeon: Manya Silvas, MD;  Location: [name] ENDOSCOPY;  Service: Endoscopy;;   FLEXIBLE BRONCHOSCOPY     KIDNEY TRANSPLANT Right 1985   LOWER EXTREMITY ANGIOGRAPHY Right 07/20/2021   Procedure: Lower Extremity Angiography;  Surgeon: Algernon Huxley, MD;  Location: [place] [facility];  Service: Cardiovascular;  Laterality: Right;   LOWER EXTREMITY ANGIOGRAPHY Right 08/06/2021   Procedure: Lower Extremity Angiography;  Surgeon: Algernon Huxley, MD;  Location: [place] [facility];  Service: Cardiovascular;  Laterality: Right;   REMOVAL TENCKHOFF CATH    Kidney transplant 1985, @[facility] for FSGS  Family History:  Has history of diabetes in multiple family members. Father, grandfather died of MI. Mother died of COPD.  Social History: Has been living in SNF [facility]) for the past 3 weeks since  last hospitalization. Lived by himself before all these hospitalizations. Previously worked for [employer] until 8 months ago. Denies ETOH use, tobacco use or illicit drug use. Normally eats a salt-restricted diet, but has been unable to do so while living at [facility]. His cousin lives in [place]. Social History   Socioeconomic History   Marital status: Single    Spouse name: Not on file   Number of children: Not on file   Years of education: Not on file   Highest education level: Not on file  Occupational History   Not on file  Tobacco Use   Smoking status: Never   Smokeless tobacco: Never  Substance and Sexual Activity   Alcohol use: Not Currently    Comment: very little   Drug use: No   Sexual activity: Not on file  Other Topics Concern   Not on file  Social History Narrative   Not on file   Social Determinants of Health   Financial Resource Strain: Not on file  Food Insecurity: Not on file  Transportation Needs: Not on file  Physical Activity: Not on file  Stress: Not on file  Social Connections: Not on file  Intimate Partner Violence: Not on file    Review of Systems: Pertinent items noted in HPI and remainder of comprehensive ROS otherwise negative.  Physical Exam: Blood pressure 117/63, pulse (!) 105, temperature 98.5 F (36.9 C), temperature source Oral, resp. rate (!) 26, SpO2 98 %. Physical Exam Vitals reviewed.  Constitutional:      General: He is not in acute distress.    Appearance: He is not ill-appearing or toxic-appearing.     Comments: Malnourished.  HENT:     Head: Normocephalic and atraumatic.     Nose: Nose normal.  Eyes:     Extraocular Movements: Extraocular movements intact.     Conjunctiva/sclera: Conjunctivae normal.     Pupils: Pupils are equal, round, and reactive to light.  Cardiovascular:     Rate and Rhythm: Tachycardia present. Rhythm irregular.     Pulses: Normal pulses.     Heart sounds: Murmur heard.     Comments: Tachycardic,  irregular rate. Referred sound from AV fistula in L arm heard throughout. Pulmonary:     Effort: Pulmonary effort is normal. No respiratory distress.     Breath sounds: Normal breath sounds. No wheezing, rhonchi or rales.  Abdominal:     General: Abdomen is flat. There is no distension.     Palpations: Abdomen is soft.     Tenderness: There is no abdominal tenderness.     Comments: Active and high-pitched bowel sounds.  Skin:    General: Skin is warm and dry.     Findings: Lesion present.     Comments: Large scab on left knee. Slight erythema surrounding stable staples at surgical site on R leg. Non-healing wound with purulence on L leg.  Neurological:     General: No focal deficit present.     Mental Status: He is alert and oriented to person, place, and time.     Sensory: No sensory deficit.  Psychiatric:     Comments: Tearful. Denies suicidal thoughts and ideation.     Lab results: Lactic Acid, Venous    Component Value Date/Time   LATICACIDVEN 1.7 10/13/2021 1310   CMP Latest Ref Rng & Units 10/13/2021 09/16/2021 09/14/2021  Glucose 70 - 99 mg/dL 82 83 86  BUN 8 - 23 mg/dL 23 31(H) 30(H)  Creatinine 0.61 - 1.24 mg/dL 3.80(H) 5.22(H) 5.21(H)  Sodium 135 - 145 mmol/L 140 134(L) 137  Potassium 3.5 - 5.1 mmol/L 3.8 4.3 4.4  Chloride 98 - 111 mmol/L 97(L) 96(L) 98  CO2 22 - 32 mmol/L 24 26 27   Calcium 8.9 - 10.3 mg/dL 10.7(H) 7.1(L) 7.1(L)  Total Protein 6.5 - 8.1 g/dL 6.0(L) - -  Total Bilirubin 0.3 - 1.2 mg/dL 1.0 - -  Alkaline Phos 38 - 126 U/L 94 - -  AST 15 - 41 U/L 31 - -  ALT 0 - 44 U/L 26 - -   CBC Latest Ref Rng & Units 10/13/2021 09/15/2021 09/14/2021  WBC 4.0 - 10.5 K/uL 5.9 5.2 5.4  Hemoglobin 13.0 - 17.0 g/dL 12.2(L) 9.7(L) 9.8(L)  Hematocrit 39.0 - 52.0 % 38.1(L) 32.1(L) 33.5(L)  Platelets 150 - 400 K/uL 157 220 274   PT 19.1 INR 1.6 aPTT 34 Blood culture pending  Imaging results:  DG Chest Port 1 View  Result Date: 10/13/2021 CLINICAL DATA:  Possible sepsis  EXAM: PORTABLE CHEST 1 VIEW COMPARISON:  08/31/2021 FINDINGS: Transverse diameter of heart is increased. Small bilateral pleural effusions are seen. Central pulmonary vessels are less prominent. There is hazy radiopacity in the left parahilar region and right lower lung fields. There is no pneumothorax. Vascular stents are seen in the left upper arm. IMPRESSION: Cardiomegaly. Central pulmonary vessels are less prominent. There are no signs of alveolar pulmonary edema. Small bilateral pleural effusions. There are faint linear densities in the left parahilar region and right lower lung fields suggesting loculated pleural effusions or atelectasis/pneumonia. Electronically Signed   By: Elmer Picker M.D.   On: 10/13/2021 13:10    MR Tibia Fibula L w/o contrast pending  Other results: EKG: atrial fibrillation, rate 150.  Assessment & Plan by Problem: Principal Problem:   Nonhealing surgical wound  #Non-healing wound on LLE #Sepsis ruleout #Hypotension Patient has history of R BKA on 08/07/21, L BKA on 09/02/21, and revision  to R AKA on 09/09/21 due to poor healing. Given the non-healing wound with signs of infection, he was started on Meropenem 586m IV on 3/4. He was seen by vascular surgery outpatient and informed that L BKA amputation is not healing properly and will require revision to AKA. Blood pressure improved after receiving LR 20028mbolus. Antibiotic changed to cefepime 2g IV and vancomycin 150021mV in the ED after consultation with pharmacy. PT elevated at 19.1. INR elevated at 1.6. aPTT wnl. Lactic acid wnl. Will adjust antibiotic coverage based on blood culture sensitivities. - consult vascular surgery - continue cefepime 2g IV and vancomycin 1500m53m pending blood culture result - trend fever curve, CBC - CRP - ESR - NPO at midnight  #Paroxysmal Atrial Fibrillation w/ RVR Patient with history of pAF. Patient was tachycardic to 170s and EKG showed atrial fibrillation. Patient  home medications include metoprolol 25mg65m, diltiazem 120mg,75m Eliquis 5mg BI71mStarted on diltiazem 15mg/hr76min the ED which has helped with rate control. - Continue diltiazem drip and titrate off - Hold eliquis pending possible vascular surgery  #ESRD s/p failed kidney transplant on hemodialysis Patient has ESRD with creatinine 3.80. Patient does not produce urine. Patient has hyperparathyroidism due to renal insufficiency. eGFR 17. - Daily renal function panel - Avoid nephrotoxic medications - Phosphorus - Mg - CMP - Nephro consult  #Dysphagia Patient has history of Barrett's esophagus and endorses difficulty with swallowing pills. - Consult SLP for barium swallow  #Loculated Pleural Effusion First noted on chest CT on 08/31/21. CXR today shows findings suggestive of loculated pleural effusions. Current antibiotic regimen includes cefepime which covers community-acquired causal bacteria. Size of effusion is small and patient is not showing signs of systemic infection, so drainage of the pleural effusion is not necessary. Effusion is not impairing respiratory function.  #Constipation Patient endorses no solid BM in past week. Has issues with rectal pain due to gas and occasional stool leakage. - Start bowel regimen  Diet: Renal with fluid restriction VTE: Eliquis IVF: LR Code: DNR   Dispo: Admit patient to Inpatient with expected length of stay greater than 2 midnights.  This is a Medical Careers information officerThe care of the patient was discussed with Dr. AmponsahCoy Saunas assessment and plan was formulated with their assistance.  Please see their note for official documentation of the patient encounter.   Signed: Kuan, ElNelva Nayl Student 10/13/2021, 6:42 PM   Pager: [phone] on weekdays and 1pm on weekends: On Call pager: [phone] reviewed the note by Elliot KNelva Nay was present during the interview and physical exam. I agree with the findings,  assessment, and plan.   Signed: AmponsahLacinda Axon/2023, 11:51 AM

## 2021-10-13 NOTE — ED Triage Notes (Signed)
Patient BIB [facility] from vascular surgeon's office for infected left BKA surgical site. HR 155-170 on arrival, midline catheter in right arm, dialysis access in left arm with MWF schedule, last dialysis yesterday. Patient is alert, oriented, and in no apparent distress at this time. ?

## 2021-10-13 NOTE — Progress Notes (Signed)
Pharmacy Antibiotic Note ? ?Scott Castro is a 66 y.o. male admitted on 10/13/2021 with sepsis.  Pharmacy has been consulted for vancomycin and cefepime dosing for concern of nonhealing L BKA amputation. Patient is ESRD on iHD MWF. Afeb.  ? ?Plan: ?Vancomycin '1500mg'$  x1  ?Vancomycin '750mg'$  with iHD - follow up iHD plans to schedule  ?Cefepime 2g x1 followed by 1g q24h  ?Monitor iHD schedule, cultures and clinical progression ?Vancomycin levels as appropriate ? ? ?  ? ?Temp (24hrs), Avg:98.1 ?F (36.7 ?C), Min:97.7 ?F (36.5 ?C), Max:98.5 ?F (36.9 ?C) ? ?No results for input(s): WBC, CREATININE, LATICACIDVEN, VANCOTROUGH, VANCOPEAK, VANCORANDOM, GENTTROUGH, GENTPEAK, GENTRANDOM, TOBRATROUGH, TOBRAPEAK, TOBRARND, AMIKACINPEAK, AMIKACINTROU, AMIKACIN in the last 168 hours.  ?CrCl cannot be calculated (Patient's most recent lab result is older than the maximum 21 days allowed.).   ? ?No Known Allergies ? ?Antimicrobials this admission: ?Vancomycin 3/7 >> ?Cefepime 3/7 >>  ? ?Dose adjustments this admission: ? ?Microbiology results: ?3/7 bcx:  ?3/7 ucx: ? ?Thank you for allowing pharmacy to be a part of this patient?s care. ? ?Cristela Felt, PharmD, BCPS ?Clinical Pharmacist ?10/13/2021 1:17 PM ? ?

## 2021-10-13 NOTE — Progress Notes (Signed)
Office Note     CC:  follow up Requesting Provider:  Franciso Bend, MD  HPI: Scott Castro is a 66 y.o. ([birth year]) male who presents status post left below the knee amputation by Dr. Trula Slade on 09/02/2021.  During the same admission he required revision of right BKA to above-the-knee amputation by Dr. [name] on 09/09/2021.  Past medical history also significant for paroxysmal atrial fibrillation as well as end-stage renal disease on hemodialysis.  Given the appearance of his nonhealing left below the knee amputation he received antibiotics during his dialysis treatment yesterday.  He currently resides at [place] facility.  Over the past 24 to 48 hours he has felt more weak than usual.  Today he is tachycardic into the 150s.  He also has borderline hypotension with a systolic of 283 mmHg.  He denies any fevers or chills.  He is aware that his left below the knee amputation is not healing and he would likely require revision to an above-the-knee amputation.   Past Medical History:  Diagnosis Date   Barrett's esophagus    Chronic kidney disease    CHRONIC\   Glomerulosclerosis, focal    Hyperparathyroidism due to renal insufficiency [facility])     Past Surgical History:  Procedure Laterality Date   ABDOMINAL AORTOGRAM W/LOWER EXTREMITY N/A 08/28/2021   Procedure: ABDOMINAL AORTOGRAM W/LOWER EXTREMITY;  Surgeon: Cherre Robins, MD;  Location: [place] [facility];  Service: Cardiovascular;  Laterality: N/A;   AMPUTATION Right 08/07/2021   Procedure: AMPUTATION BELOW KNEE;  Surgeon: Algernon Huxley, MD;  Location: [facility] ORS;  Service: Vascular;  Laterality: Right;   AMPUTATION Left 09/02/2021   Procedure: LEFT BELOW KNEE AMPUTATION;  Surgeon: Serafina Mitchell, MD;  Location: [place];  Service: Vascular;  Laterality: Left;   AMPUTATION Right 09/09/2021   Procedure: RIGHT AMPUTATION ABOVE KNEE;  Surgeon: Cherre Robins, MD;  Location: [place];  Service: Vascular;  Laterality: Right;   AV  FISTULA PLACEMENT     COLONOSCOPY N/A 03/22/2016   Procedure: COLONOSCOPY;  Surgeon: Manya Silvas, MD;  Location: [facility] ENDOSCOPY;  Service: Endoscopy;  Laterality: N/A;   DG AV DIALYSIS GRAFT DECLOT OR     ESOPHAGOGASTRODUODENOSCOPY (EGD) WITH PROPOFOL  03/22/2016   Procedure: ESOPHAGOGASTRODUODENOSCOPY (EGD) WITH PROPOFOL;  Surgeon: Manya Silvas, MD;  Location: [name] ENDOSCOPY;  Service: Endoscopy;;   FLEXIBLE BRONCHOSCOPY     KIDNEY TRANSPLANT Right 1985   LOWER EXTREMITY ANGIOGRAPHY Right 07/20/2021   Procedure: Lower Extremity Angiography;  Surgeon: Algernon Huxley, MD;  Location: [place] [facility];  Service: Cardiovascular;  Laterality: Right;   LOWER EXTREMITY ANGIOGRAPHY Right 08/06/2021   Procedure: Lower Extremity Angiography;  Surgeon: Algernon Huxley, MD;  Location: [place] [facility];  Service: Cardiovascular;  Laterality: Right;   REMOVAL TENCKHOFF CATH      Social History   Socioeconomic History   Marital status: Single    Spouse name: Not on file   Number of children: Not on file   Years of education: Not on file   Highest education level: Not on file  Occupational History   Not on file  Tobacco Use   Smoking status: Never   Smokeless tobacco: Never  Substance and Sexual Activity   Alcohol use: Not Currently    Comment: very little   Drug use: No   Sexual activity: Not on file  Other Topics Concern   Not on file  Social History Narrative   Not  on file   Social Determinants of Health   Financial Resource Strain: Not on file  Food Insecurity: Not on file  Transportation Needs: Not on file  Physical Activity: Not on file  Stress: Not on file  Social Connections: Not on file  Intimate Partner Violence: Not on file    Family History  Problem Relation Age of Onset   Heart disease Mother    Heart disease Father     Current Outpatient Medications  Medication Sig Dispense Refill   acetaminophen (TYLENOL) 325 MG tablet Take 2 tablets (650 mg total) by  mouth every 6 (six) hours as needed for mild pain or moderate pain.     albuterol (VENTOLIN HFA) 108 (90 Base) MCG/ACT inhaler Inhale 2 puffs into the lungs every 6 (six) hours as needed for wheezing or shortness of breath.     apixaban (ELIQUIS) 5 MG TABS tablet Take 1 tablet (5 mg total) by mouth 2 (two) times daily. 60 tablet 0   atorvastatin (LIPITOR) 10 MG tablet Take 1 tablet (10 mg total) by mouth at bedtime. 30 tablet 0   bisacodyl (DULCOLAX) 10 MG suppository Place 1 suppository (10 mg total) rectally daily as needed for moderate constipation.     diltiazem (CARDIZEM CD) 120 MG 24 hr capsule Take 1 capsule (120 mg total) by mouth daily. Hold for sbp < 100     gabapentin (NEURONTIN) 100 MG capsule Take 1 capsule (100 mg total) by mouth 2 (two) times daily.     metoprolol tartrate (LOPRESSOR) 25 MG tablet Take 1.5 tablets (37.5 mg total) by mouth 2 (two) times daily.     midodrine (PROAMATINE) 10 MG tablet Take 1 tablet (10 mg total) by mouth 3 (three) times daily with meals.     multivitamin (RENA-VIT) TABS tablet Take 1 tablet by mouth at bedtime.     Nutritional Supplements (FEEDING SUPPLEMENT, NEPRO CARB STEADY,) LIQD Take 237 mLs by mouth 3 (three) times daily between meals.     oxyCODONE (OXY IR/ROXICODONE) 5 MG immediate release tablet Take 1 tablet (5 mg total) by mouth every 6 (six) hours as needed for severe pain. 10 tablet 0   pantoprazole (PROTONIX) 40 MG tablet Take 40 mg by mouth daily.     polyethylene glycol (MIRALAX / GLYCOLAX) 17 g packet Take 17 g by mouth daily.     senna-docusate (SENOKOT-S) 8.6-50 MG tablet Take 2 tablets by mouth 2 (two) times daily.     sertraline (ZOLOFT) 100 MG tablet Take 1 tablet (100 mg total) by mouth daily.     sevelamer carbonate (RENVELA) 800 MG tablet Take 2400 mg by mouth three times daily with meals, 800 mg 3 times daily as needed with snacks     witch hazel-glycerin (TUCKS) pad Apply topically as needed for itching.     No current  facility-administered medications for this visit.    No Known Allergies   REVIEW OF SYSTEMS:   '[X]'$  denotes positive finding, '[ ]'$  denotes negative finding Cardiac  Comments:  Chest pain or chest pressure:    Shortness of breath upon exertion:    Short of breath when lying flat:    Irregular heart rhythm:        Vascular    Pain in calf, thigh, or hip brought on by ambulation:    Pain in feet at night that wakes you up from your sleep:     Blood clot in your veins:    Leg swelling:  Pulmonary    Oxygen at home:    Productive cough:     Wheezing:         Neurologic    Sudden weakness in arms or legs:     Sudden numbness in arms or legs:     Sudden onset of difficulty speaking or slurred speech:    Temporary loss of vision in one eye:     Problems with dizziness:         Gastrointestinal    Blood in stool:     Vomited blood:         Genitourinary    Burning when urinating:     Blood in urine:        Psychiatric    Major depression:         Hematologic    Bleeding problems:    Problems with blood clotting too easily:        Skin    Rashes or ulcers:        Constitutional    Fever or chills:      PHYSICAL EXAMINATION:  Vitals:   10/13/21 1114  BP: 102/70  Pulse: (!) 150  Resp: 18  Temp: 97.7 F (36.5 C)  TempSrc: Temporal  SpO2: 95%  Weight: 144 lb (65.3 kg)  Height: '5\' 9"'$  (1.753 m)    General:  weak, malnourished Gait: Not observed HENT: WNL, normocephalic Pulmonary: normal non-labored breathing Cardiac: tachycardia Abdomen: soft, NT, no masses Skin: without rashes Extremities: Pitting edema to the level of the thighs bilaterally; nonhealing right above-the-knee amputation with dry eschar over the mid incision covering the femur; nonhealing left below the knee amputation pictured below with purulent drainage Musculoskeletal: no muscle wasting or atrophy  Neurologic: A&O X 3;  No focal weakness or paresthesias are detected Psychiatric:   The pt has Normal affect.      ASSESSMENT/PLAN:: 66 y.o. male here for postoperative follow-up of bilateral lower extremity amputations  -Nonhealing right AKA with portion of incision overlying femur with dry eschar -Left below the knee amputation appears infected with purulent drainage -Clinically, the patient is weak and appearing malnourished.  He is tachycardic into the 150s.  He was noted during last admission that he has sustained tachycardia however this now seems to be symptomatic.  He is also borderline hypotensive with a systolic blood pressure of 102.  He is end-stage renal disease on hemodialysis with pitting edema in his thighs.  He will require revision of left below the knee amputation to above-the-knee however I believe he needs to be optimized medically before it is safe to proceed with amputation.  We called nonemergent EMS transport to [facility], ER where he will need to be admitted for the above reasons prior to surgery likely later this week   Dagoberto Ligas, PA-C [facility] [phone]  Clinic MD:  [name]

## 2021-10-14 ENCOUNTER — Encounter (HOSPITAL_COMMUNITY): Payer: Self-pay | Admitting: Internal Medicine

## 2021-10-14 ENCOUNTER — Inpatient Hospital Stay (HOSPITAL_COMMUNITY): Payer: Medicare Other

## 2021-10-14 DIAGNOSIS — I4891 Unspecified atrial fibrillation: Secondary | ICD-10-CM

## 2021-10-14 DIAGNOSIS — T8189XA Other complications of procedures, not elsewhere classified, initial encounter: Secondary | ICD-10-CM | POA: Diagnosis not present

## 2021-10-14 DIAGNOSIS — T8781 Dehiscence of amputation stump: Secondary | ICD-10-CM

## 2021-10-14 LAB — CBC
HCT: 35.7 % — ABNORMAL LOW (ref 39.0–52.0)
Hemoglobin: 11.4 g/dL — ABNORMAL LOW (ref 13.0–17.0)
MCH: 30.5 pg (ref 26.0–34.0)
MCHC: 31.9 g/dL (ref 30.0–36.0)
MCV: 95.5 fL (ref 80.0–100.0)
Platelets: 123 10*3/uL — ABNORMAL LOW (ref 150–400)
RBC: 3.74 MIL/uL — ABNORMAL LOW (ref 4.22–5.81)
RDW: 18.2 % — ABNORMAL HIGH (ref 11.5–15.5)
WBC: 6 10*3/uL (ref 4.0–10.5)
nRBC: 0 % (ref 0.0–0.2)

## 2021-10-14 LAB — RENAL FUNCTION PANEL
Albumin: 2 g/dL — ABNORMAL LOW (ref 3.5–5.0)
Anion gap: 18 — ABNORMAL HIGH (ref 5–15)
BUN: 26 mg/dL — ABNORMAL HIGH (ref 8–23)
CO2: 22 mmol/L (ref 22–32)
Calcium: 10.3 mg/dL (ref 8.9–10.3)
Chloride: 97 mmol/L — ABNORMAL LOW (ref 98–111)
Creatinine, Ser: 3.81 mg/dL — ABNORMAL HIGH (ref 0.61–1.24)
GFR, Estimated: 17 mL/min — ABNORMAL LOW (ref >60)
Glucose, Bld: 83 mg/dL (ref 70–99)
Phosphorus: 5.1 mg/dL — ABNORMAL HIGH (ref 2.5–4.6)
Potassium: 3.6 mmol/L (ref 3.5–5.1)
Sodium: 137 mmol/L (ref 135–145)

## 2021-10-14 LAB — APTT: aPTT: 36 seconds (ref 24–36)

## 2021-10-14 MED ORDER — SODIUM THIOSULFATE 250 MG/ML IV SOLN
25.0000 g | INTRAVENOUS | Status: DC
  Administered 2021-10-17 – 2021-10-19 (×2): 25 g via INTRAVENOUS
  Filled 2021-10-14 (×4): qty 100

## 2021-10-14 MED ORDER — APIXABAN 5 MG PO TABS: 5.0000 mg | ORAL_TABLET | Freq: Two times a day (BID) | ORAL | Status: DC

## 2021-10-14 MED ORDER — HEPARIN (PORCINE) 25000 UT/250ML-% IV SOLN
1150.0000 [IU]/h | INTRAVENOUS | Status: DC
  Administered 2021-10-14: 900 [IU]/h via INTRAVENOUS
  Filled 2021-10-14: qty 250

## 2021-10-14 MED ORDER — MIDODRINE HCL 5 MG PO TABS
10.0000 mg | ORAL_TABLET | Freq: Three times a day (TID) | ORAL | Status: DC
  Administered 2021-10-14 – 2021-10-18 (×11): 10 mg via ORAL
  Administered 2021-10-18: 15 mg via ORAL
  Administered 2021-10-18: 10 mg via ORAL
  Filled 2021-10-14 (×12): qty 2

## 2021-10-14 MED ORDER — VANCOMYCIN HCL 500 MG/100ML IV SOLN
500.0000 mg | INTRAVENOUS | Status: DC
  Administered 2021-10-15 – 2021-10-17 (×2): 500 mg via INTRAVENOUS
  Filled 2021-10-14 (×3): qty 100

## 2021-10-14 MED ORDER — GERHARDT'S BUTT CREAM
TOPICAL_CREAM | Freq: Every day | CUTANEOUS | Status: DC
  Filled 2021-10-14 (×2): qty 1

## 2021-10-14 MED ORDER — CHLORHEXIDINE GLUCONATE CLOTH 2 % EX PADS
6.0000 | MEDICATED_PAD | Freq: Every day | CUTANEOUS | Status: DC
  Administered 2021-10-15 – 2021-10-23 (×8): 6 via TOPICAL

## 2021-10-14 NOTE — Progress Notes (Signed)
ANTICOAGULATION CONSULT NOTE - Initial Consult ? ?Pharmacy Consult for heparin ?Indication: atrial fibrillation ? ?No Known Allergies ? ?Patient Measurements: ?Height: '5\' 9"'$  (175.3 cm) ?Weight: 65 kg (143 lb 4.8 oz) ?IBW/kg (Calculated) : 70.7 ?Heparin Dosing Weight: 65 kg ? ?Vital Signs: ?Temp: 98.1 ?F (36.7 ?C) (03/08 1942) ?Temp Source: Oral (03/08 1942) ?BP: 133/97 (03/08 1942) ?Pulse Rate: 97 (03/08 1942) ? ?Labs: ?Recent Labs  ?  10/13/21 ?1310 10/14/21 ?4696 10/14/21 ?2200  ?HGB 12.2* 11.4*  --   ?HCT 38.1* 35.7*  --   ?PLT 157 123*  --   ?APTT 34  --  36  ?LABPROT 19.1*  --   --   ?INR 1.6*  --   --   ?CREATININE 3.80* 3.81*  --   ? ? ? ?Estimated Creatinine Clearance: 17.5 mL/min (A) (by C-G formula based on SCr of 3.81 mg/dL (H)). ? ? ?Medical History: ?Past Medical History:  ?Diagnosis Date  ? Atrial fibrillation ([place])   ? Barrett's esophagus   ? Chronic kidney disease   ? CHRONIC\  ? Glomerulosclerosis, focal   ? Hyperparathyroidism due to renal insufficiency ([place])   ? Rheumatic fever   ?  ? ?Assessment: ?66 yo M on apixaban PTA for afib now held for AKA on 3/10. Last dose 3/7 AM. Patient is on dialysis. Pharmacy consulted for heparin.   ? ?PTT came back at 36 tonight. Rate running without issue per RN. Increase rate then check level in AM.  ? ?Goal of Therapy:  ?Heparin level 0.3-0.7 units/ml ?aPTT 66-102 seconds ?Monitor platelets by anticoagulation protocol: Yes ?  ?Plan:  ?Increase heparin 1100 units/hr, no bolus ?F/u aPTT until correlates with heparin level  ?Monitor daily aPTT, heparin level, CBC ?Monitor for signs/symptoms of bleeding  ? ?Onnie Boer, PharmD, BCIDP, AAHIVP, CPP ?Infectious Disease Pharmacist ?10/14/2021 10:48 PM ? ? ? ?

## 2021-10-14 NOTE — Evaluation (Signed)
Clinical/Bedside Swallow Evaluation ?Patient Details  ?Name: Scott Castro ?MRN: [id number] ?Date of Birth: 1955-10-18 ? ?Today's Date: 10/14/2021 ?Time: SLP Start Time (ACUTE ONLY): [id number] SLP Stop Time (ACUTE ONLY): 1004 ?SLP Time Calculation (min) (ACUTE ONLY): 12 min ? ?Past Medical History:  ?Past Medical History:  ?Diagnosis Date  ? Barrett's esophagus   ? Chronic kidney disease   ? CHRONIC\  ? Glomerulosclerosis, focal   ? Hyperparathyroidism due to renal insufficiency ([place])   ? ?Past Surgical History:  ?Past Surgical History:  ?Procedure Laterality Date  ? ABDOMINAL AORTOGRAM W/LOWER EXTREMITY N/A 08/28/2021  ? Procedure: ABDOMINAL AORTOGRAM W/LOWER EXTREMITY;  Surgeon: Cherre Robins, MD;  Location: [place] [facility];  Service: Cardiovascular;  Laterality: N/A;  ? AMPUTATION Right 08/07/2021  ? Procedure: AMPUTATION BELOW KNEE;  Surgeon: Algernon Huxley, MD;  Location: [facility] ORS;  Service: Vascular;  Laterality: Right;  ? AMPUTATION Left 09/02/2021  ? Procedure: LEFT BELOW KNEE AMPUTATION;  Surgeon: Serafina Mitchell, MD;  Location: [facility] OR;  Service: Vascular;  Laterality: Left;  ? AMPUTATION Right 09/09/2021  ? Procedure: RIGHT AMPUTATION ABOVE KNEE;  Surgeon: Cherre Robins, MD;  Location: [facility] OR;  Service: Vascular;  Laterality: Right;  ? AV FISTULA PLACEMENT    ? COLONOSCOPY N/A 03/22/2016  ? Procedure: COLONOSCOPY;  Surgeon: Manya Silvas, MD;  Location: [facility] ENDOSCOPY;  Service: Endoscopy;  Laterality: N/A;  ? DG AV DIALYSIS GRAFT DECLOT OR    ? ESOPHAGOGASTRODUODENOSCOPY (EGD) WITH PROPOFOL  03/22/2016  ? Procedure: ESOPHAGOGASTRODUODENOSCOPY (EGD) WITH PROPOFOL;  Surgeon: Manya Silvas, MD;  Location: [facility] ENDOSCOPY;  Service: Endoscopy;;  ? FLEXIBLE BRONCHOSCOPY    ? KIDNEY TRANSPLANT Right 1985  ? LOWER EXTREMITY ANGIOGRAPHY Right 07/20/2021  ? Procedure: Lower Extremity Angiography;  Surgeon: Algernon Huxley, MD;  Location: [place] [facility];  Service: Cardiovascular;  Laterality: Right;  ? LOWER  EXTREMITY ANGIOGRAPHY Right 08/06/2021  ? Procedure: Lower Extremity Angiography;  Surgeon: Algernon Huxley, MD;  Location: [place] [facility];  Service: Cardiovascular;  Laterality: Right;  ? REMOVAL TENCKHOFF CATH    ? ?HPI:  ?Scott Castro is a 66 yo male presenting from [place] [facility] on 10/13/21 after being seen in the office by vascular surgery for a non-healing wound on recent left surgical site (hx critical limb ischemia and bilateral BKA and R AKA). PMH includes ESRD on dialysis (19 years), hypertension, hyperlipidemia, PAF, depression, and chronic respiratory failure 2 L of oxygen, Barrett's esophagus. Admitted for left BKA conversion to an AKA. Pt known to SLP from last admission in January, during which swallowing was evaluated due to complaints of pill dysphagia. Last EGD was August 2017 with confirmed findings of Barrett's esophagus.  ?  ?Assessment / Plan / Recommendation  ?Clinical Impression ? Pt presents with ongoing complaints of pill and occasional solid food dysphagia that has worsened since January 2023.  He describes globus with pills and dry solid foods, as well as occasional signficant coughing events when drinking thin liquids. Clinical examination today was normal, however, given his described s/s, recommend proceeding with MBS today to further evaluate swallow function. Pt agrees with plan - scheduled for 1:00 this afternoon. ?SLP Visit Diagnosis: Dysphagia, unspecified (R13.10) ?   ?Aspiration Risk ? No limitations  ?  ?Diet Recommendation   Regular solids, thin liquids ? ?Medication Administration: Crushed with puree  ?  ?Other  Recommendations Oral Care Recommendations: Oral care BID   ? ?Recommendations for follow up therapy are  one component of a multi-disciplinary discharge planning process, led by the attending physician.  Recommendations may be updated based on patient status, additional functional criteria and insurance authorization. ? ?Follow up Recommendations   TBA ? ? ?   ? ? ?Swallow Study   ?General HPI: Scott Castro is a 66 yo male presenting from [place] [facility] on 10/13/21 after being seen in the office by vascular surgery for a non-healing wound on recent left surgical site (hx critical limb ischemia and bilateral BKA and R AKA). PMH includes ESRD on dialysis (19 years), hypertension, hyperlipidemia, PAF, depression, and chronic respiratory failure 2 L of oxygen, Barrett's esophagus. Admitted for left BKA conversion to an AKA. Pt known to SLP from last admission in January, during which swallowing was evaluated due to complaints of pill dysphagia. Last EGD was August 2017 with confirmed findings of Barrett's esophagus. ?Type of Study: Bedside Swallow Evaluation ?Previous Swallow Assessment: see HPI ?Diet Prior to this Study: Regular;Thin liquids ?Temperature Spikes Noted: No ?Respiratory Status: Nasal cannula ?History of Recent Intubation: No ?Behavior/Cognition: Alert;Cooperative;Pleasant mood ?Oral Cavity Assessment: Within Functional Limits ?Oral Care Completed by SLP: No ?Oral Cavity - Dentition: Adequate natural dentition ?Vision: Functional for self-feeding ?Self-Feeding Abilities: Able to feed self ?Patient Positioning: Upright in bed ?Baseline Vocal Quality: Normal ?Volitional Cough: Strong ?Volitional Swallow: Able to elicit  ?  ?Oral/Motor/Sensory Function Overall Oral Motor/Sensory Function: Within functional limits   ?Ice Chips Ice chips: Within functional limits   ?Thin Liquid Thin Liquid: Within functional limits  ?  ?Nectar Thick Nectar Thick Liquid: Not tested   ?Honey Thick Honey Thick Liquid: Not tested   ?Puree Puree: Within functional limits   ?Solid ? ? ?  Solid: Not tested  ? ?  ? ?Scott Castro ?10/14/2021,10:17 AM ? ?Emelda Kohlbeck L. Hulan Szumski, MA CCC/SLP ?Acute Rehabilitation Services ?Office number [phone] ?Pager [phone] ? ? ?

## 2021-10-14 NOTE — Progress Notes (Signed)
PIV placed 3/8 at 1410 was assessed.  Beeped one time while present.  Assessed for a new PIV and only found the R AC to be an option (L arm restriction).  RN advised of findings. ?

## 2021-10-14 NOTE — Progress Notes (Signed)
LOS: 1 day  Patient Summary: Scott Castro is a 66 year old male with a past medical history of paroxysmal atrial fibrillation on eliquis, ESRD on HD MWF (19 years) s/p failed kidney transplant, Barrett's esophagus, and critical limb ischemia leading to bilateral BKA and R AKA, who presents with a non-healing wound on his L surgical site and concern for infection.  Overnight Events: No acute overnight events.  Interim History: Patient was seen and evaluated at bedside by team on rounds. Patient reports feeling better than yesterday. He denies dyspnea, fevers, headaches, and chest pain. He was seen by vascular surgery this morning and says that the plan is for left leg surgery on Friday. He is agreeable with this plan. He has intermittent left leg pain that he rates 2/10 currently. He received tylenol this morning with some improvement in pain. He asked to stay on diltiazem IV rather than oral due to difficulty swallowing pills.   Objective: Vital signs in last 24 hours: Vitals:   10/14/21 0059 10/14/21 0318 10/14/21 0700 10/14/21 1113  BP: 118/68 (!) 103/57 (!) 94/55 120/83  Pulse: (!) 110 (!) 105 (!) 105 (!) 101  Resp: 18 20 16 19   Temp: 98.1 F (36.7 C) 98.1 F (36.7 C) 98.6 F (37 C) 98.7 F (37.1 C)  TempSrc: Oral Oral Oral Oral  SpO2: 95% 94% 94% 95%  Weight: 65 kg     Height: 5' 9"  (1.753 m)      SpO2: 95 % Weight change:   Intake/Output Summary (Last 24 hours) at 10/14/2021 1400 Last data filed at 10/14/2021 1158 Gross per 24 hour  Intake 3334.33 ml  Output --  Net 3334.33 ml    Physical Exam Vitals reviewed.  Constitutional:      General: He is not in acute distress.    Appearance: He is not toxic-appearing.     Comments: Malnourished.  HENT:     Head: Normocephalic and atraumatic.     Nose: Nose normal.  Eyes:     Extraocular Movements: Extraocular movements intact.     Conjunctiva/sclera: Conjunctivae normal.     Pupils: Pupils are equal, round, and reactive to  light.  Cardiovascular:     Rate and Rhythm: Rhythm irregular.     Pulses: Normal pulses.     Heart sounds: Murmur heard.     Comments: Irregular rate and rhythm. Referred sound from AV fistula in L arm heard throughout. Pulmonary:     Effort: Pulmonary effort is normal. No respiratory distress.     Breath sounds: Normal breath sounds. No wheezing, rhonchi or rales.  Abdominal:     General: Abdomen is flat. There is no distension.     Palpations: Abdomen is soft.     Tenderness: There is no abdominal tenderness to palpation.     Comments: Active bowel sounds.  Skin:    General: Skin is warm and dry.     Findings: Lesion present.     Comments: 4-5cm scab on left knee. Erythema surrounding stable staples at surgical site on R leg with purulent drainage. Left leg wound is wrapped in clean bandage.  Neurological:     General: No focal deficit present.     Mental Status: He is alert and oriented to person, place, and time.     Sensory: No sensory deficit.    Lab Results: CBC CBC Latest Ref Rng & Units 10/14/2021 10/13/2021 09/15/2021  WBC 4.0 - 10.5 K/uL 6.0 5.9 5.2  Hemoglobin 13.0 - 17.0 g/dL  11.4(L) 12.2(L) 9.7(L)  Hematocrit 39.0 - 52.0 % 35.7(L) 38.1(L) 32.1(L)  Platelets 150 - 400 K/uL 123(L) 157 220   BMP Latest Ref Rng & Units 10/14/2021 10/13/2021 09/16/2021  Glucose 70 - 99 mg/dL 83 82 83  BUN 8 - 23 mg/dL 26(H) 23 31(H)  Creatinine 0.61 - 1.24 mg/dL 3.81(H) 3.80(H) 5.22(H)  Sodium 135 - 145 mmol/L 137 140 134(L)  Potassium 3.5 - 5.1 mmol/L 3.6 3.8 4.3  Chloride 98 - 111 mmol/L 97(L) 97(L) 96(L)  CO2 22 - 32 mmol/L 22 24 26   Calcium 8.9 - 10.3 mg/dL 10.3 10.7(H) 7.1(L)     Studies/Results: MR TIBIA FIBULA LEFT WO CONTRAST  Result Date: 10/13/2021 CLINICAL DATA:  Soft tissue infection suspected. Nonhealing wound on left surgical site. Concern for infection. EXAM: MRI OF LOWER LEFT EXTREMITY WITHOUT CONTRAST TECHNIQUE: Multiplanar, multisequence MR imaging of the left knee was  performed. No intravenous contrast was administered. COMPARISON:  None available FINDINGS: Bones/Joint/Cartilage Postsurgical changes are noted of left below-the-knee amputation. There is metallic artifact from likely surgical skin staples at the distal anterior aspect of the amputation site. There is heterogeneous predominantly decreased T1 and heterogeneous both decreased and increased T2 signal within the soft tissues at the distal aspect of the stump including the soleus musculature. There is mild marrow edema within the distal aspect of the remaining tibial diaphysis, very faint but possibly extending up to 4 cm proximal to the distal amputation edge. There is no significant cortication of the distal tibial amputation edge and this surgery may be recent. Ligaments The ACL and PCL appear intact. The visualized portions of the medial collateral ligament and fibular collateral ligament appear intact. Muscles and Tendons There is diffuse edema seen throughout the visualized posterior thigh hamstring musculature. Soft tissues There is moderate edema and swelling of the subcutaneous fat of the medial and anterolateral greater than posterolateral thigh and proximal calf. No walled-off fluid collection is seen. IMPRESSION: 1. Left below-knee amputation. There is moderate subcutaneous fat edema and swelling of the left leg. Heterogeneous signal within the soft tissues distal to the amputation stump are suspicious for an infectious/inflammatory process, possible cellulitis. There is subtle marrow edema seen within the left tibial diaphyseal amputation stump, raising suspicion for osteomyelitis. The distal osseous amputation site does not appear to be well corticated, consistent with fairly recent surgery. 2. No walled-off fluid collection is seen. Electronically Signed   By: Yvonne Kendall M.D.   On: 10/13/2021 19:45   DG Chest Port 1 View  Result Date: 10/13/2021 CLINICAL DATA:  Possible sepsis EXAM: PORTABLE CHEST 1  VIEW COMPARISON:  08/31/2021 FINDINGS: Transverse diameter of heart is increased. Small bilateral pleural effusions are seen. Central pulmonary vessels are less prominent. There is hazy radiopacity in the left parahilar region and right lower lung fields. There is no pneumothorax. Vascular stents are seen in the left upper arm. IMPRESSION: Cardiomegaly. Central pulmonary vessels are less prominent. There are no signs of alveolar pulmonary edema. Small bilateral pleural effusions. There are faint linear densities in the left parahilar region and right lower lung fields suggesting loculated pleural effusions or atelectasis/pneumonia. Electronically Signed   By: Elmer Picker M.D.   On: 10/13/2021 13:10   Medications: Scheduled Meds:  atorvastatin  10 mg Oral QHS   [START ON 10/15/2021] Chlorhexidine Gluconate Cloth  6 each Topical Q0600   gabapentin  100 mg Oral BID   Gerhardt's butt cream   Topical Daily   midodrine  10 mg Oral TID  WC   sertraline  100 mg Oral Daily   vancomycin variable dose per unstable renal function (pharmacist dosing)   Does not apply See admin instructions   Continuous Infusions:  ceFEPime (MAXIPIME) IV     diltiazem (CARDIZEM) infusion 15 mg/hr (10/13/21 1951)   heparin     [START ON 10/15/2021] vancomycin     PRN Meds:.acetaminophen **OR** acetaminophen, ondansetron **OR** ondansetron (ZOFRAN) IV, senna-docusate, simethicone Assessment/Plan: Principal Problem:   Nonhealing surgical wound  #Non-healing wound on LLE #Sepsis ruleout #Hypotension #Cellulitis Patient has history of R BKA on 08/07/21, L BKA on 09/02/21, and revision to R AKA on 09/09/21 due to poor healing. Started on Meropenem 512m IV on 3/4. Blood pressure improved after receiving LR 20081mbolus in ED. Patient received 3L LR IV over 20 hours. Antibiotic changed to cefepime 2g IV and vancomycin 150011mV in the ED. PT elevated at 19.1. INR elevated at 1.6. aPTT wnl. Lactic acid wnl. MRI of L leg showed  infectious process distal to amputation stump concerning for cellulitis and marrow edema within the stump suspicious for osteomyelitis. Will adjust antibiotic coverage based on blood culture sensitivities. No growth at 24 hours for first blood culture. No growth <12 hours for second blood culture.  - per vascular surgery, plan for revision to L AKA and possible debridement of R AKA surgical site on Friday - continue cefepime 1g IV Q24H, will adjust according to pending blood cultures - vancomycin 750m30m with iHD - trend fever curve, CBC - restart midodrine 10mg36m   #Paroxysmal Atrial Fibrillation w/ RVR Patient with history of pAF on eliquis. Patient was tachycardic to 170s and EKG showed atrial fibrillation in ED. Patient home medications include metoprolol 25mg 52m diltiazem 120mg, 19mEliquis 5mg BID15mtarted on diltiazem 15mg/hr 48mn the ED. Rate has improved and has remained in low 100s today. - continue diltiazem drip and titrate off - continue holding eliquis with plan for surgery on Friday - start heparin 900unit/hr, no bolus - daily heparin level, aPTT, CBC   #ESRD, anuric, s/p failed kidney transplant on hemodialysis Patient has ESRD and is anuric. Patient has hyperparathyroidism due to renal insufficiency. Renal panel unchanged from yesterday with creatinine 3.81 and eGFR 17.  - Daily renal function panel - Avoid nephrotoxic medications - Nephro consulted, plan for HD tomorrow  #Dysphagia Patient has history of Barrett's esophagus and endorses difficulty with swallowing pills and occasional difficulty with solid foods. Seen by SLP who recommended to crush medication with puree. Barium swallow scheduled for this afternoon.  #Constipation Patient endorses no solid BM in past week. Has issues with rectal pain due to gas and occasional stool leakage. - Bowel regimen - Start Mylicon tablet as needed - Start Gerhardt's butt cream daily  Diet: Renal with fluid restriction VTE:  Heparin IV IVF: none Code: DNR  Prior to Admission Living Arrangement: SNF Anticipated Discharge Location: SNF Barriers to Discharge: Medical stability Dispo: Anticipated discharge in approximately 3 day(s).   This is a Medical SCareers information officerhe care of the patient was discussed with Dr. Narendra Dareen Pianoassessment and plan formulated with their assistance.  Please see their attached note for official documentation of the daily encounter.  Yorley Buch, EllNelva Nay Student 10/14/2021, 2:00 PM Pager: [phone] on weekdays and 1pm on weekends: On Call pager [phone]

## 2021-10-14 NOTE — Consult Note (Addendum)
Renal Service ?Consult Note ?[facility] ? ?Scott Castro ?10/14/2021 ?Scott Blazing, MD ?Requesting Physician: Dr. Dareen Piano ? ?Reason for Consult: ESRD pt w/ non-healing stump wound ?HPI: The patient is a 66 y.o. year-old w/ hx of ESRD on HD, sec HPTh, afib on eliquis, hx of failed renal transplant, PAD sp L BKA and R AKA sent to ED from [facility] office due to non-healing wound on L BKA surgical site and concern for infection. In ED BP's soft, tachycardia, RR 20-25, RA at 95%.  Pt given IV vanc and cefepime and IVF's for hypotension. We are asked to see for ESRD.  ? ?Pt seen in room. Pt lives in [place] and was getting HD at [facility] in [place], but currently is in a SNF in [place] and getting HD in [place]. On dialysis for 19 years (3 yrs PD).  ? ? ?ROS - denies CP, no joint pain, no HA, no blurry vision, no rash, no diarrhea, no nausea/ vomiting,  ? ? ?Past Medical History  ?Past Medical History:  ?Diagnosis Date  ? Barrett's esophagus   ? Chronic kidney disease   ? CHRONIC\  ? Glomerulosclerosis, focal   ? Hyperparathyroidism due to renal insufficiency ([place])   ? ?Past Surgical History  ?Past Surgical History:  ?Procedure Laterality Date  ? ABDOMINAL AORTOGRAM W/LOWER EXTREMITY N/A 08/28/2021  ? Procedure: ABDOMINAL AORTOGRAM W/LOWER EXTREMITY;  Surgeon: Cherre Robins, MD;  Location: [facility];  Service: Cardiovascular;  Laterality: N/A;  ? AMPUTATION Right 08/07/2021  ? Procedure: AMPUTATION BELOW KNEE;  Surgeon: Algernon Huxley, MD;  Location: [facility] ORS;  Service: Vascular;  Laterality: Right;  ? AMPUTATION Left 09/02/2021  ? Procedure: LEFT BELOW KNEE AMPUTATION;  Surgeon: Serafina Mitchell, MD;  Location[facility] OR;  Service: Vascular;  Laterality: Left;  ? AMPUTATION Right 09/09/2021  ? Procedure: RIGHT AMPUTATION ABOVE KNEE;  Surgeon: Cherre Robins, MD;  Location: [facility] OR;  Service: Vascular;  Laterality: Right;  ? AV FISTULA PLACEMENT    ? COLONOSCOPY N/A 03/22/2016  ? Procedure:  COLONOSCOPY;  Surgeon: Manya Silvas, MD;  Location: [facility] ENDOSCOPY;  Service: Endoscopy;  Laterality: N/A;  ? DG AV DIALYSIS GRAFT DECLOT OR    ? ESOPHAGOGASTRODUODENOSCOPY (EGD) WITH PROPOFOL  03/22/2016  ? Procedure: ESOPHAGOGASTRODUODENOSCOPY (EGD) WITH PROPOFOL;  Surgeon: Manya Silvas, MD;  Location: [facility] ENDOSCOPY;  Service: Endoscopy;;  ? FLEXIBLE BRONCHOSCOPY    ? KIDNEY TRANSPLANT Right 1985  ? LOWER EXTREMITY ANGIOGRAPHY Right 07/20/2021  ? Procedure: Lower Extremity Angiography;  Surgeon: Algernon Huxley, MD;  Location: [facility];  Service: Cardiovascular;  Laterality: Right;  ? LOWER EXTREMITY ANGIOGRAPHY Right 08/06/2021  ? Procedure: Lower Extremity Angiography;  Surgeon: Algernon Huxley, MD;  Location: [facility];  Service: Cardiovascular;  Laterality: Right;  ? REMOVAL TENCKHOFF CATH    ? ?Family History  ?Family History  ?Problem Relation Age of Onset  ? Heart disease Mother   ? Heart disease Father   ? ?Social History  reports that he has never smoked. He has never used smokeless tobacco. He reports that he does not currently use alcohol. He reports that he does not use drugs. ?Allergies No Known Allergies ?Home medications ?Prior to Admission medications   ?Medication Sig Start Date End Date Taking? Authorizing Provider  ?acetaminophen (TYLENOL) 325 MG tablet Take 2 tablets (650 mg total) by mouth every 6 (six) hours as needed for mild pain or moderate pain. 09/16/21  Yes  Hongalgi, Lenis Dickinson, MD  ?albuterol (VENTOLIN HFA) 108 (90 Base) MCG/ACT inhaler Inhale 2 puffs into the lungs every 6 (six) hours as needed for wheezing or shortness of breath. 07/08/21  Yes Enzo Bi, MD  ?apixaban (ELIQUIS) 5 MG TABS tablet Take 1 tablet (5 mg total) by mouth 2 (two) times daily. 07/22/21  Yes Sharen Hones, MD  ?atorvastatin (LIPITOR) 10 MG tablet Take 1 tablet (10 mg total) by mouth at bedtime. 07/22/21  Yes Sharen Hones, MD  ?bisacodyl (DULCOLAX) 10 MG suppository Place 1 suppository (10 mg  total) rectally daily as needed for moderate constipation. 09/16/21  Yes Hongalgi, Lenis Dickinson, MD  ?diltiazem (CARDIZEM CD) 120 MG 24 hr capsule Take 1 capsule (120 mg total) by mouth daily. Hold for sbp < 100 08/12/21  Yes Kc, Maren Beach, MD  ?gabapentin (NEURONTIN) 100 MG capsule Take 1 capsule (100 mg total) by mouth 2 (two) times daily. 08/12/21  Yes Antonieta Pert, MD  ?hydrocortisone (ANUSOL-HC) 25 MG suppository Place 25 mg rectally 2 (two) times daily.   Yes [provider]  ?meropenem (MERREM) IVPB Inject 500 mg into the vein See admin instructions. Qd x 7 days   Yes [provider]  ?metoprolol tartrate (LOPRESSOR) 25 MG tablet Take 1.5 tablets (37.5 mg total) by mouth 2 (two) times daily. 09/16/21  Yes Hongalgi, Lenis Dickinson, MD  ?midodrine (PROAMATINE) 10 MG tablet Take 1 tablet (10 mg total) by mouth 3 (three) times daily with meals. 09/16/21  Yes Hongalgi, Lenis Dickinson, MD  ?multivitamin (RENA-VIT) TABS tablet Take 1 tablet by mouth at bedtime. 09/16/21  Yes Hongalgi, Lenis Dickinson, MD  ?Nutritional Supplements (FEEDING SUPPLEMENT, NEPRO CARB STEADY,) LIQD Take 237 mLs by mouth 3 (three) times daily between meals. 09/16/21  Yes Hongalgi, Lenis Dickinson, MD  ?oxyCODONE (OXY IR/ROXICODONE) 5 MG immediate release tablet Take 1 tablet (5 mg total) by mouth every 6 (six) hours as needed for severe pain. 09/16/21  Yes Hongalgi, Lenis Dickinson, MD  ?pantoprazole (PROTONIX) 40 MG tablet Take 40 mg by mouth daily. 03/27/19  Yes [provider]  ?polyethylene glycol (MIRALAX / GLYCOLAX) 17 g packet Take 17 g by mouth daily. 09/16/21  Yes Hongalgi, Lenis Dickinson, MD  ?senna-docusate (SENOKOT-S) 8.6-50 MG tablet Take 2 tablets by mouth 2 (two) times daily. 08/12/21  Yes Antonieta Pert, MD  ?sertraline (ZOLOFT) 100 MG tablet Take 1 tablet (100 mg total) by mouth daily. 09/17/21  Yes Hongalgi, Lenis Dickinson, MD  ?sevelamer carbonate (RENVELA) 800 MG tablet Take 2400 mg by mouth three times daily with meals, 800 mg 3 times daily as needed with snacks ?Patient taking  differently: Take 1,600 mg by mouth 3 (three) times daily with meals. 09/16/21  Yes Hongalgi, Lenis Dickinson, MD  ?witch hazel-glycerin (TUCKS) pad Apply topically as needed for itching. ?Patient not taking: Reported on 10/14/2021 09/16/21   Modena Jansky, MD  ? ? ? ?Vitals:  ? 10/14/21 0034 10/14/21 0059 10/14/21 0318 10/14/21 0700  ?BP:  118/68 (!) 103/57 (!) 94/55  ?Pulse:  (!) 110 (!) 105 (!) 105  ?Resp:  '18 20 16  '$ ?Temp: 97.9 ?F (36.6 ?C) 98.1 ?F (36.7 ?C) 98.1 ?F (36.7 ?C) 98.6 ?F (37 ?C)  ?TempSrc: Oral Oral Oral Oral  ?SpO2:  95% 94% 94%  ?Weight:  65 kg    ?Height:  '5\' 9"'$  (1.753 m)    ? ?Exam ?Gen alert, frail, nasal cann O2, no distress ?No rash, cyanosis or gangrene ?Sclera anicteric, throat clear  ?No  jvd or bruits ?Chest clear bilat to bases, no rales/ wheezing ?RRR no RG ?Abd soft ntnd no mass or ascites +bs ?GU normal male ?MS no joint effusions or deformity ?Ext L BKA and R AKA, no edema ?Neuro is alert, Ox 3 , nf ?  LUA AVF+bruit  ? ? ? ? ? Home meds include - eliquis 5 bid, lipitor, neurontin 100 bid, midodrine 10 tid, zoloft,  ? ?  BP's soft 88- 115/ 55- 83, HR 111  , RR 13- 25 afeb  ? ? OP HD: MWF [place] (was [facility])    ?    4h  400/600  60.3kg  2/2.5 bath  LUA AVG  Hep none ? - hect 3 ug tiw IV ? - sodium thiosulfate 25 gm tiw IV  ? - home: renvela packet 2.4gm ac tid ?  ? ? ?Assessment/ Plan: ?Wound infection of BKA stump - IV abx per pmd, ortho consulted ?Hypotension - 2/2 sepsis vs vol depletion, on midodrine 10 tid.  ?Volume - no vol excess on exam and is at dry wt. Keep even w hd ?Afib RVR - per pmd ?ESRD - on HD MWF. No strong indication for HD today, will plan HD tomorrow off schedule.  ?Anemia ckd - Hb 11, no esa needs ?MBD ckd - Ca++ high, hold any Ca/ vit D products. Phos in range, cont binder.  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 DC hectorol.  ?DNR ?  ? ? ? ?Kelly Splinter  MD ?10/14/2021, 11:29 AM ?Recent Labs  ?Lab 10/13/21 ?1310 10/13/21 ?2119  10/14/21 ?7408  ?HGB 12.2*  --  11.4*  ?ALBUMIN 2.2*  --  2.0*  ?CALCIUM 10.7*  --  10.3  ?PHOS  --  5.0* 5.1*  ?CREATININE 3.80*  --  3.81*  ?K 3.8  --  3.6  ? ? ?

## 2021-10-14 NOTE — Plan of Care (Signed)
?  Problem: Education: ?Goal: Knowledge of General Education information will improve ?Description: Including pain rating scale, medication(s)/side effects and non-pharmacologic comfort measures ?Outcome: Progressing ?  ?Problem: Health Behavior/Discharge Planning: ?Goal: Ability to manage health-related needs will improve ?Outcome: Progressing ?  ?Problem: Clinical Measurements: ?Goal: Ability to maintain clinical measurements within normal limits will improve ?Outcome: Progressing ?Goal: Will remain free from infection ?Outcome: Progressing ?Goal: Diagnostic test results will improve ?Outcome: Progressing ?Goal: Respiratory complications will improve ?Outcome: Progressing ?Goal: Cardiovascular complication will be avoided ?Outcome: Progressing ?  ?Problem: Nutrition: ?Goal: Adequate nutrition will be maintained ?Outcome: Progressing ?  ?Problem: Coping: ?Goal: Level of anxiety will decrease ?Outcome: Progressing ?  ?Problem: Elimination: ?Goal: Will not experience complications related to urinary retention ?Outcome: Progressing ?  ?Problem: Pain Managment: ?Goal: General experience of comfort will improve ?Outcome: Progressing ?  ?Problem: Safety: ?Goal: Ability to remain free from injury will improve ?Outcome: Progressing ?  ?Problem: Activity: ?Goal: Risk for activity intolerance will decrease ?Outcome: Not Progressing ?  ?Problem: Elimination: ?Goal: Will not experience complications related to bowel motility ?Outcome: Not Progressing ?  ?Problem: Skin Integrity: ?Goal: Risk for impaired skin integrity will decrease ?Outcome: Not Progressing ?  ?

## 2021-10-14 NOTE — Progress Notes (Signed)
ANTICOAGULATION CONSULT NOTE - Initial Consult ? ?Pharmacy Consult for heparin ?Indication: atrial fibrillation ? ?No Known Allergies ? ?Patient Measurements: ?Height: '5\' 9"'$  (175.3 cm) ?Weight: 65 kg (143 lb 4.8 oz) ?IBW/kg (Calculated) : 70.7 ?Heparin Dosing Weight: 65 kg ? ?Vital Signs: ?Temp: 98.7 ?F (37.1 ?C) (03/08 1113) ?Temp Source: Oral (03/08 1113) ?BP: 120/83 (03/08 1113) ?Pulse Rate: 101 (03/08 1113) ? ?Labs: ?Recent Labs  ?  10/13/21 ?1310 10/14/21 ?7078  ?HGB 12.2* 11.4*  ?HCT 38.1* 35.7*  ?PLT 157 123*  ?APTT 34  --   ?LABPROT 19.1*  --   ?INR 1.6*  --   ?CREATININE 3.80* 3.81*  ? ? ?Estimated Creatinine Clearance: 17.5 mL/min (A) (by C-G formula based on SCr of 3.81 mg/dL (H)). ? ? ?Medical History: ?Past Medical History:  ?Diagnosis Date  ? Barrett's esophagus   ? Chronic kidney disease   ? CHRONIC\  ? Glomerulosclerosis, focal   ? Hyperparathyroidism due to renal insufficiency ([place])   ?  ? ?Assessment: ?66 yo M on apixaban PTA for afib now held for AKA on 3/10. Last dose 3/7 AM. Patient is on dialysis. Pharmacy consulted for heparin.   ? ? ?Goal of Therapy:  ?Heparin level 0.3-0.7 units/ml ?aPTT 66-102 seconds ?Monitor platelets by anticoagulation protocol: Yes ?  ?Plan:  ?Heparin 900 units/hr, no bolus ?F/u aPTT until correlates with heparin level  ?F/u 8hr aPTT ?Monitor daily aPTT, heparin level, CBC ?Monitor for signs/symptoms of bleeding  ? ?Benetta Spar, PharmD, BCPS, BCCP ?Clinical Pharmacist ? ?Please check AMION for all [place] phone numbers ?After 10:00 PM, call [place] [phone] ? ?

## 2021-10-14 NOTE — Progress Notes (Signed)
? ?  VASCULAR SURGERY ASSESSMENT & PLAN:  ? ?NONHEALING LEFT BKA: As per the photograph yesterday I do not think the left BKA has any significant chance of healing.  I would recommend conversion to an AKA.  If he is medically stable I could do this Friday. ? ?WOUND RIGHT AKA: I will keep an eye on this.  He could potentially require debridement of this in the operating room Friday also. ? ? ?SUBJECTIVE:  ? ?No specific complaints this morning. ? ?PHYSICAL EXAM:  ? ?Vitals:  ? 10/14/21 0000 10/14/21 0034 10/14/21 0059 10/14/21 0086  ?BP: (!) 113/95  118/68 (!) 103/57  ?Pulse: (!) 101  (!) 110 (!) 105  ?Resp: '13  18 20  '$ ?Temp:  97.9 ?F (36.6 ?C) 98.1 ?F (36.7 ?C) 98.1 ?F (36.7 ?C)  ?TempSrc:  Oral Oral Oral  ?SpO2: 93%  95% 94%  ?Weight:   65 kg   ?Height:   '5\' 9"'$  (1.753 m)   ? ?The dressing on his left AKA has moderate drainage. ? ?LABS:  ? ?Lab Results  ?Component Value Date  ? WBC 5.9 10/13/2021  ? HGB 12.2 (L) 10/13/2021  ? HCT 38.1 (L) 10/13/2021  ? MCV 96.2 10/13/2021  ? PLT 157 10/13/2021  ? ?Lab Results  ?Component Value Date  ? CREATININE 3.80 (H) 10/13/2021  ? ?Lab Results  ?Component Value Date  ? INR 1.6 (H) 10/13/2021  ? ? ?PROBLEM LIST:   ? ?Principal Problem: ?  Nonhealing surgical wound ? ? ?CURRENT MEDS:  ? ? atorvastatin  10 mg Oral QHS  ? gabapentin  100 mg Oral BID  ? sertraline  100 mg Oral Daily  ? vancomycin variable dose per unstable renal function (pharmacist dosing)   Does not apply See admin instructions  ? ? ?Deitra Mayo ?Office: [phone] ?10/14/2021 ? ?

## 2021-10-14 NOTE — Progress Notes (Signed)
?Date: 10/14/2021 ? ?Patient name: Scott Castro  ?Medical record number: [id number]  ?Date of birth: 28-Jan-1956  ? ? ?I saw and examined the patient. I reviewed the resident?s note and I agree with the resident?s findings and plan as documented in the resident?s note. ? ?In brief, patient is a 66-year-old male with past medical history of A-fib on anticoagulation, ESRD on hemodialysis status post failed kidney transplant, Barrett's esophagus and critical limb ischemia leading to bilateral BKA with conversion of the right BKA to an AKA who presented to the ED from the vascular surgery office with concerns of infection of his left BKA stump. ? ?Patient was seen in the vascular surgery office yesterday was noted to be tachycardic with heart rates in the 170s and hypotensive.  Patient was also noted to have nonhealing of his left BKA wound with concern for possible infection and was referred to the ED for further evaluation.  Of note, patient had a revision of his right BKA to an AKA on February 1 and also had a left BKA on January 25.  He has been getting meropenem at dialysis but appears to have worsened despite this.  Patient denies any chest pain, no fevers or chills, no headache, no blurry vision, no lightheadedness, no syncope, no focal weakness, no tingling or numbness, no palpitations.  Patient does complain of worsening generalized weakness. ? ?On exam, patient is mildly tachycardic with an irregular rhythm.  Abdomen is soft, nontender, nondistended, normoactive bowel sounds.  Lungs are clear to auscultation.  His left BKA stump is bandaged.  Does appear to have some erythema surrounding his surgical staples of his right AKA with mild purulent drainage. ? ?Patient was admitted to the hospital with nonhealing of his left BKA wound with associated A-fib with RVR and hypotension.  Patient was on meropenem with dialysis as an outpatient for his left BKA wound but this appears to worsen despite his antibiotics.  MRI  done on admission of left lower extremity showed infectious process in the soft tissues distal to amputation stump concerning for infection possibly cellulitis as well as concern for osteomyelitis in the tibia.  We will continue with cefepime and vancomycin for now.  Vascular surgery follow-up recommendations appreciated.  Patient will need revision of his left BKA to an AKA.  This will be done on Friday if patient remains medically stable.  Patient is also noted to have erythema and purulent drainage from his right AKA and will likely require debridement of this at the time of his left AKA. ? ?Patient was noted to be hypotensive on admission with SBP's in the 80s.  By hypotension is likely multifactorial from his A-fib with RVR, underlying infection as well as volume depletion.  Patient was initially started on LR to replete his volume.  His blood pressures are remained in the low 100s today.  Will DC LR and resume patient's home midodrine and monitor closely.  Patient has no signs of volume overload currently.  We will continue with HD per nephrology.  No further work-up at this time ? ?Patient does have a history of A-fib and presented to the ED with heart rates in the 170s.  Patient was started on diltiazem drip with improvement in his heart rates.  We will attempt to titrate him off his diltiazem drip today and resume his oral medications for rate control (metoprolol and diltiazem).  Patient be taken to the OR on Friday for left AKA and debridement of his right AKA.  We will hold Eliquis at this time and continue with heparin drip for anticoagulation.  No further work-up for now ? ? ?Aldine Contes, MD ?10/14/2021, 2:26 PM ? ?

## 2021-10-14 NOTE — Progress Notes (Signed)
Pt receives out-pt HD at [facility] on MWF. Pt arrives at 11:00 for 11:15 chair time. Pt admitted from snf. Will assist as needed.  ? ?Melven Sartorius ?Renal Navigator ?[phone] ?

## 2021-10-14 NOTE — TOC Progression Note (Addendum)
Transition of Care (TOC) - Progression Note  ? ? ?Patient Details  ?Name: Scott Castro ?MRN: [id number] ?Date of Birth: 01-04-1956 ? ?Transition of Care (TOC) CM/SW Contact  ?Angelita Ingles, RN ?Phone Number:[phone] ? ?10/14/2021, 12:26 PM ? ?Clinical Narrative:    ? ?Transition of Care (TOC) Screening Note ? ? ?Patient Details  ?Name: Scott Castro ?Date of Birth: 10/25/1955 ? ? ?Transition of Care (TOC) CM/SW Contact:    ?Angelita Ingles, RN ?Phone Number: ?10/14/2021, 12:27 PM ? ? ? ?Transition of Care Department [facility]) has reviewed patient and no TOC needs have been identified at this time. We will continue to monitor patient advancement through interdisciplinary progression rounds.  ? ? ? ? ?  ?  ? ?Expected Discharge Plan and Services ?  ?  ?  ?  ?  ?                ?  ?  ?  ?  ?  ?  ?  ?  ?  ?  ? ? ?Social Determinants of Health (SDOH) Interventions ?  ? ?Readmission Risk Interventions ?Readmission Risk Prevention Plan 07/22/2021 04/10/2019  ?Transportation Screening Complete Complete  ?PCP or Specialist Appt within 3-5 Days Complete Complete  ?[place] or Home Care Consult Complete Complete  ?Social Work Consult for [name] Planning/Counseling Complete Complete  ?Palliative Care Screening Not Applicable -  ?Medication Review Press photographer) Complete Complete  ?Some recent data might be hidden  ? ? ?

## 2021-10-15 ENCOUNTER — Inpatient Hospital Stay (HOSPITAL_COMMUNITY): Payer: Medicare Other

## 2021-10-15 ENCOUNTER — Encounter (HOSPITAL_COMMUNITY): Payer: Self-pay | Admitting: Internal Medicine

## 2021-10-15 LAB — HEPARIN LEVEL (UNFRACTIONATED): Heparin Unfractionated: 0.31 IU/mL (ref 0.30–0.70)

## 2021-10-15 LAB — CBC
HCT: 35.7 % — ABNORMAL LOW (ref 39.0–52.0)
Hemoglobin: 11.1 g/dL — ABNORMAL LOW (ref 13.0–17.0)
MCH: 29.8 pg (ref 26.0–34.0)
MCHC: 31.1 g/dL (ref 30.0–36.0)
MCV: 96 fL (ref 80.0–100.0)
Platelets: 153 10*3/uL (ref 150–400)
RBC: 3.72 MIL/uL — ABNORMAL LOW (ref 4.22–5.81)
RDW: 18.2 % — ABNORMAL HIGH (ref 11.5–15.5)
WBC: 6 10*3/uL (ref 4.0–10.5)
nRBC: 0 % (ref 0.0–0.2)

## 2021-10-15 LAB — RENAL FUNCTION PANEL
Albumin: 1.8 g/dL — ABNORMAL LOW (ref 3.5–5.0)
Anion gap: 16 — ABNORMAL HIGH (ref 5–15)
BUN: 32 mg/dL — ABNORMAL HIGH (ref 8–23)
CO2: 24 mmol/L (ref 22–32)
Calcium: 9.9 mg/dL (ref 8.9–10.3)
Chloride: 98 mmol/L (ref 98–111)
Creatinine, Ser: 4.66 mg/dL — ABNORMAL HIGH (ref 0.61–1.24)
GFR, Estimated: 13 mL/min — ABNORMAL LOW (ref >60)
Glucose, Bld: 128 mg/dL — ABNORMAL HIGH (ref 70–99)
Phosphorus: 5.3 mg/dL — ABNORMAL HIGH (ref 2.5–4.6)
Potassium: 3.9 mmol/L (ref 3.5–5.1)
Sodium: 138 mmol/L (ref 135–145)

## 2021-10-15 LAB — APTT: aPTT: 60 seconds — ABNORMAL HIGH (ref 24–36)

## 2021-10-15 MED ORDER — DILTIAZEM HCL-DEXTROSE 125-5 MG/125ML-% IV SOLN (PREMIX)
5.0000 mg/h | INTRAVENOUS | Status: AC
  Filled 2021-10-15: qty 125

## 2021-10-15 MED ORDER — DILTIAZEM HCL ER COATED BEADS 120 MG PO CP24
120.0000 mg | ORAL_CAPSULE | Freq: Every day | ORAL | Status: DC
  Administered 2021-10-15: 11:00:00 120 mg via ORAL
  Filled 2021-10-15: qty 1

## 2021-10-15 MED ORDER — HEPARIN (PORCINE) 25000 UT/250ML-% IV SOLN
1150.0000 [IU]/h | INTRAVENOUS | Status: AC
  Administered 2021-10-15: 10:00:00 1150 [IU]/h via INTRAVENOUS
  Filled 2021-10-15 (×2): qty 250

## 2021-10-15 MED ORDER — HEPARIN SODIUM (PORCINE) 1000 UNIT/ML DIALYSIS
1500.0000 [IU] | INTRAMUSCULAR | Status: DC | PRN
  Filled 2021-10-15: qty 2

## 2021-10-15 NOTE — Progress Notes (Signed)
[facility] ?Progress Note ? ?Subjective: seen in room, awaiting HD today ? ?Vitals:  ? 10/14/21 2312 10/15/21 0424 10/15/21 0757 10/15/21 1117  ?BP: 102/76 109/75 127/89 104/72  ?Pulse: 91 96 92 93  ?Resp: 20 19 (!) 24 20  ?Temp: 97.6 ?F (36.4 ?C) 97.6 ?F (36.4 ?C) 97.7 ?F (36.5 ?C) 97.6 ?F (36.4 ?C)  ?TempSrc: Oral Oral Oral Axillary  ?SpO2: 94% 95% 90% 95%  ?Weight:      ?Height:      ? ? ?Exam: ?Gen alert, frail, nasal cann O2, no distress ?No rash, cyanosis or gangrene ?Sclera anicteric, throat clear  ?No jvd or bruits ?Chest clear bilat to bases, no rales/ wheezing ?RRR no RG ?Abd soft ntnd no mass or ascites +bs ?GU normal male ?MS no joint effusions or deformity ?Ext L BKA and R AKA, no edema ?Neuro is alert, Ox 3 , nf ?  LUA AVF+bruit  ?  ?  ?  ?  ? Home meds include - eliquis 5 bid, lipitor, neurontin 100 bid, midodrine 10 tid, zoloft,  ?  ?  BP's soft 88- 115/ 55- 83, HR 111  , RR 13- 25 afeb  ?  ? OP HD: MWF [place] (was [employer] [facility])    ?    4h  400/600  60.3kg  2/2.5 bath  LUA AVG  Hep none ? - hect 3 ug tiw IV (dc'd here) ? - sodium thiosulfate 25 gm tiw IV  ? - home: renvela packet 2.4gm ac tid ?  ?  ?  ?Assessment/ Plan: ?Wound infection of BKA stump - IV abx per pmd. Per VVS will have new L AKA and revision of R AKA tomorrow.  ?Hypotension - 2/2 sepsis vs vol depletion, on midodrine 10 tid.  ?Volume - wt's up but no vol ^ on exam. Small UF goal.  ?Afib RVR - per pmd ?ESRD - on HD MWF. HD today off sched and then Sat off schedule so as not to intefere w/ his surgery tomorrow. HD time will be shortened due to high census/ staffing issues.  ?Anemia ckd - Hb 11, no esa needs ?MBD ckd - Ca++ high and +calciphylaxis, hold any Ca/ vit D products. Phos in range, cont binder.  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 DC'd hectorol.  ?DNR ?  ?  ?  ? ? ? ? ?Rob Doctor, hospital ?10/15/2021, 2:20 PM ? ? ?Recent Labs  ?Lab 10/14/21 ?2094 10/15/21 ?0616 10/15/21 ?1050  ?K  3.6  --  3.9  ?BUN 26*  --  32*  ?CREATININE 3.81*  --  4.66*  ?ALBUMIN 2.0*  --  1.8*  ?CALCIUM 10.3  --  9.9  ?PHOS 5.1*  --  5.3*  ?HGB 11.4* 11.1*  --   ? ?Inpatient medications: ? atorvastatin  10 mg Oral QHS  ? Chlorhexidine Gluconate Cloth  6 each Topical Q0600  ? diltiazem  120 mg Oral Daily  ? gabapentin  100 mg Oral BID  ? Gerhardt's butt cream   Topical Daily  ? midodrine  10 mg Oral TID WC  ? sertraline  100 mg Oral Daily  ? ? ceFEPime (MAXIPIME) IV 1 g (10/15/21 1354)  ? heparin 1,150 Units/hr (10/15/21 0940)  ? [START ON 10/16/2021] sodium thiosulfate infusion for calciphylaxis    ? vancomycin    ? ?acetaminophen **OR** acetaminophen, [START ON 10/16/2021] heparin, ondansetron **OR** ondansetron (ZOFRAN) IV, senna-docusate, simethicone ? ? ? ? ? ? ?

## 2021-10-15 NOTE — Plan of Care (Signed)
Discussed with patient plan of care for the evening, pain management and informed consent with some teach back displayed. ? ?Problem: Education: ?Goal: Knowledge of General Education information will improve ?Description: Including pain rating scale, medication(s)/side effects and non-pharmacologic comfort measures ?Outcome: Progressing ?  ?

## 2021-10-15 NOTE — Progress Notes (Signed)
? ?  VASCULAR SURGERY ASSESSMENT & PLAN:  ? ?NONHEALING LEFT BELOW THE KNEE AMPUTATION: He is scheduled for a left above-the-knee amputation tomorrow. ? ?WOUND ON THE RIGHT ABOVE-THE-KNEE AMPUTATION: I will also revise his right above-the-knee amputation tomorrow. ? ?ANTICOAGULATION: The patient is on intravenous heparin for atrial fibrillation.  I will stop this at 7 AM in anticipation of surgery later in the morning. ? ?END-STAGE RENAL DISEASE: Please do not dialyze the patient tomorrow as he is scheduled for surgery in the morning.  Thank you. ? ?NUTRITION: He has severe protein calorie malnutrition.  I have consulted the nutrition team. ? ?I have written preop orders. ? ?SUBJECTIVE:  ? ?No specific complaints this morning. ? ?PHYSICAL EXAM:  ? ?Vitals:  ? 10/14/21 1942 10/14/21 2312 10/15/21 0424 10/15/21 0757  ?BP: (!) 133/97 102/76 109/75 127/89  ?Pulse: 97 91 96 92  ?Resp: '20 20 19 '$ (!) 24  ?Temp: 98.1 ?F (36.7 ?C) 97.6 ?F (36.4 ?C) 97.6 ?F (36.4 ?C) 97.7 ?F (36.5 ?C)  ?TempSrc: Oral Oral Oral Oral  ?SpO2: 96% 94% 95% 90%  ?Weight:      ?Height:      ? ?The dressing on the left below the knee amputation has moderate drainage. ?He has a wound on the right that needs to be debrided.  However the bone is fairly prominent so I think the bone will have to be shortened also to revise his right above-the-knee amputation. ? ? ? ?LABS:  ? ?Lab Results  ?Component Value Date  ? WBC 6.0 10/15/2021  ? HGB 11.1 (L) 10/15/2021  ? HCT 35.7 (L) 10/15/2021  ? MCV 96.0 10/15/2021  ? PLT 153 10/15/2021  ? ?Lab Results  ?Component Value Date  ? CREATININE 3.81 (H) 10/14/2021  ? ?Lab Results  ?Component Value Date  ? INR 1.6 (H) 10/13/2021  ? ? ?PROBLEM LIST:   ? ?Principal Problem: ?  Nonhealing surgical wound ? ? ?CURRENT MEDS:  ? ? atorvastatin  10 mg Oral QHS  ? Chlorhexidine Gluconate Cloth  6 each Topical Q0600  ? gabapentin  100 mg Oral BID  ? Gerhardt's butt cream   Topical Daily  ? midodrine  10 mg Oral TID WC  ?  sertraline  100 mg Oral Daily  ? ? ?Deitra Mayo ?Office: [phone] ?10/15/2021  ?

## 2021-10-15 NOTE — Progress Notes (Signed)
Modified Barium Swallow Progress Note ? ?Patient Details  ?Name: Scott Castro ?MRN: [id number] ?Date of Birth: Mar 09, 1956 ? ?Today's Date: 10/15/2021 ? ?Modified Barium Swallow completed.  Full report located under Chart Review in the Imaging Section. ? ?Brief recommendations include the following: ? ?Clinical Impression ? Pt has known Barrett's esophagua and a suspected baseline dysphagia affecting pharyngeal phase that he is able to compensate for when he is healthy. He appears to have a possible exaggerated cervical lordosis and small osteophytes however his epiglottis is able to fully invert albeit late. Delayed pharyngeal protective mechanisms led to penetration to the vocal cords with thin which cleared cords but remained in vestibule. Chin tuck did not prevent penetration. Vallecular and pyriform sinus residue min-mod that reduced but remained despite multiple effortful swallows and right head turn. Nectar thick resulted in mild residue but not airway invasion. Pt may benefit from having his liquids thickened to nectar consistency while he is continuing to heal and gain strength. He is also scheduled for surgery requiring an upcoming intubation. Suspect he will be able to return to thin liquids in the near future. Continue regular texture, nectar thick liquids, pills whole in puree. Esophageal scan was unremarkable. ?  ?Swallow Evaluation Recommendations ? ?   ? ? SLP Diet Recommendations: Regular solids;Nectar thick liquid ? ? Liquid Administration via: Cup;Straw ? ? Medication Administration: Whole meds with puree ? ? Supervision: Patient able to self feed;Intermittent supervision to cue for compensatory strategies ? ? Compensations: Slow rate;Small sips/bites;Multiple dry swallows after each bite/sip ? ? Postural Changes: Seated upright at 90 degrees ? ? Oral Care Recommendations: Oral care BID ? ?   ? ? ? ?Houston Siren ?10/15/2021,10:24 AM ?Cranford Mon.Ed CCC-SLP ?Speech-Language  Pathologist ?Office [phone] ? ?

## 2021-10-15 NOTE — Plan of Care (Signed)
?  Problem: Education: ?Goal: Knowledge of General Education information will improve ?Description: Including pain rating scale, medication(s)/side effects and non-pharmacologic comfort measures ?Outcome: Progressing ?  ?Problem: Health Behavior/Discharge Planning: ?Goal: Ability to manage health-related needs will improve ?Outcome: Progressing ?  ?Problem: Clinical Measurements: ?Goal: Ability to maintain clinical measurements within normal limits will improve ?Outcome: Progressing ?Goal: Diagnostic test results will improve ?Outcome: Progressing ?Goal: Respiratory complications will improve ?Outcome: Progressing ?Goal: Cardiovascular complication will be avoided ?Outcome: Progressing ?  ?Problem: Activity: ?Goal: Risk for activity intolerance will decrease ?Outcome: Progressing ?  ?Problem: Nutrition: ?Goal: Adequate nutrition will be maintained ?Outcome: Progressing ?  ?Problem: Coping: ?Goal: Level of anxiety will decrease ?Outcome: Progressing ?  ?Problem: Elimination: ?Goal: Will not experience complications related to bowel motility ?Outcome: Progressing ?Goal: Will not experience complications related to urinary retention ?Outcome: Progressing ?  ?Problem: Pain Managment: ?Goal: General experience of comfort will improve ?Outcome: Progressing ?  ?Problem: Safety: ?Goal: Ability to remain free from injury will improve ?Outcome: Progressing ?  ?Problem: Skin Integrity: ?Goal: Risk for impaired skin integrity will decrease ?Outcome: Progressing ?  ?Problem: Clinical Measurements: ?Goal: Will remain free from infection ?Outcome: Not Progressing ?  ?

## 2021-10-15 NOTE — Progress Notes (Addendum)
? LOS: 2 days  ?Patient Summary: Scott Castro is a 66 year old male with a past medical history of paroxysmal atrial fibrillation on eliquis, ESRD on HD MWF (19 years) s/p failed kidney transplant, Barrett's esophagus, and critical limb ischemia leading to bilateral BKA and R AKA, who presents with a non-healing wound on his L surgical site and concern for infection. ? ?Overnight Events: No acute overnight events. ? ?Interim History: Patient was seen and evaluated at bedside by team on rounds. Patient is feeling better today. Patient reports improvement in flatulence after receiving medication. He denies any left leg pain. ? ?Objective: ?Vital signs in last 24 hours: ?Vitals:  ? 10/14/21 1942 10/14/21 2312 10/15/21 0424 10/15/21 0757  ?BP: (!) 133/97 102/76 109/75 127/89  ?Pulse: 97 91 96 92  ?Resp: '20 20 19 '$ (!) 24  ?Temp: 98.1 ?F (36.7 ?C) 97.6 ?F (36.4 ?C) 97.6 ?F (36.4 ?C) 97.7 ?F (36.5 ?C)  ?TempSrc: Oral Oral Oral Oral  ?SpO2: 96% 94% 95% 90%  ?Weight:      ?Height:      ? ?SpO2: 90 % ?Weight change:  ? ?Intake/Output Summary (Last 24 hours) at 10/15/2021 1058 ?Last data filed at 10/15/2021 0425 ?Gross per 24 hour  ?Intake 836.65 ml  ?Output --  ?Net 836.65 ml  ? ? Physical Exam ?Vitals reviewed.  ?Constitutional:   ?   General: Patient laying in bed in no acute distress watching TV. He appears malnourished, with prominent ribcage. AV fistula in L arm appears hypertrophied. ?HENT:  ?   Head: Normocephalic and atraumatic.  ?   Nose: Nose normal.  ?Eyes:  ?   Extraocular Movements: Extraocular movements intact.  ?   Conjunctiva/sclera: Conjunctivae normal.  ?   Pupils: Pupils are equal, round, and reactive to light.  ?Cardiovascular:  ?   Rate and Rhythm: Rhythm irregular.  ?   Pulses: Normal pulses.  ?   Heart sounds: Murmur heard.  ?   Comments: Irregular rate and rhythm. Referred sound from AV fistula in L arm. ?Pulmonary:  ?   Effort: Pulmonary effort is normal. No respiratory distress.  ?   Breath sounds:  Normal breath sounds. No wheezing, rhonchi or rales.  ?Abdominal:  ?   General: Abdomen is flat. There is no distension.  ?   Palpations: Abdomen is soft.  ?   Tenderness: There is no abdominal tenderness to palpation.  ?   Comments: Bowel sounds on auscultation. ?Skin: ?   General: Skin is warm and dry.  ?   Findings: Lesion present.  ?   Comments: Scab on left knee. Erythema surrounding surgical staples on R leg, but improved compared to yesterday. Left leg wound is wrapped in clean bandage.  ?Neurological:  ?   General: No focal deficit present.  ?   Mental Status: He is alert and oriented to person, place, and time.  ?   Sensory: No sensory deficit.  ? ? ?Lab Results: ?CBC ?CBC Latest Ref Rng & Units 10/15/2021 10/14/2021 10/13/2021  ?WBC 4.0 - 10.5 K/uL 6.0 6.0 5.9  ?Hemoglobin 13.0 - 17.0 g/dL 11.1(L) 11.4(L) 12.2(L)  ?Hematocrit 39.0 - 52.0 % 35.7(L) 35.7(L) 38.1(L)  ?Platelets 150 - 400 K/uL 153 123(L) 157  ? ?aPTT 60 ?Heparin level 0.31 ? ?Studies/Results: ?Barium Swallow Impression 10/15/21: ?Pt has known Barrett's esophagua and a suspected baseline dysphagia affecting pharyngeal phase that he is able to compensate for when he is healthy. He appears to have a possible exaggerated cervical lordosis and  small osteophytes however his epiglottis is able to fully invert albeit late. Delayed pharyngeal protective mechanisms led to penetration to the vocal cords with thin which cleared cords but remained in vestibule. Chin tuck did not prevent penetration. Vallecular and pyriform sinus residue min-mod that reduced but remained despite multiple effortful swallows and right head turn. Nectar thick resulted in mild residue but not airway invasion. Pt may benefit from having his liquids thickened to nectar consistency while he is continuing to heal and gain strength. He is also scheduled for surgery requiring an upcoming intubation. Suspect he will be able to return to thin liquids in the near future. Continue regular  texture, nectar thick liquids, pills whole in puree. Esophageal scan was unremarkable. ? ?Medications: ?Scheduled Meds: ? atorvastatin  10 mg Oral QHS  ? Chlorhexidine Gluconate Cloth  6 each Topical Q0600  ? diltiazem  120 mg Oral Daily  ? gabapentin  100 mg Oral BID  ? Gerhardt's butt cream   Topical Daily  ? midodrine  10 mg Oral TID WC  ? sertraline  100 mg Oral Daily  ? ?Continuous Infusions: ? ceFEPime (MAXIPIME) IV 1 g (10/14/21 1425)  ? diltiazem (CARDIZEM) infusion    ? heparin 1,150 Units/hr (10/15/21 0940)  ? [START ON 10/16/2021] sodium thiosulfate infusion for calciphylaxis    ? vancomycin    ? ?PRN Meds:.acetaminophen **OR** acetaminophen, [START ON 10/16/2021] heparin, ondansetron **OR** ondansetron (ZOFRAN) IV, senna-docusate, simethicone ?Assessment/Plan: ?Principal Problem: ?  Nonhealing surgical wound ? ?#Non-healing wound on LLE ?#Sepsis ruleout ?#Hypotension ?#Cellulitis ?Patient has history of R BKA on 08/07/21, L BKA on 09/02/21, and revision to R AKA on 09/09/21 due to poor healing. Started on Meropenem '500mg'$  IV on 3/4. Antibiotic changed to cefepime 2g IV and vancomycin '1500mg'$  IV in the ED. MRI of L leg showed infectious process in soft tissues distal to amputation stump concerning for cellulitis and marrow edema within the stump suspicious for osteomyelitis. Blood cx with no growth till date.  ?- per vascular surgery, plan for L AKA and revision of R AKA surgical site on Friday ?- continue cefepime 1g IV Q24H, will adjust according to blood cultures ?- vancomycin '500mg'$  IV with iHD ?- trend fever curve, CBC ?- midodrine '10mg'$  TID ?- NPO after midnight ?  ?#Paroxysmal Atrial Fibrillation w/ RVR ?Patient with history of pAF on eliquis. Patient home medications include metoprolol '25mg'$  BID, diltiazem '120mg'$ , and Eliquis '5mg'$  BID. aPTT 60 today with goal of 66-102. Rate is controlled on diltiazem drip.  ?- start diltiazem '120mg'$  PO and discontinue diltiazem drip ?- continue holding eliquis prior to  surgery on Friday, plan to restart post-op ?- heparin titration per pharmacy ?- discontinue heparin at [place] on 3/10 prior to surgery ? ?#ESRD, anuric, s/p failed kidney transplant on hemodialysis ?Patient has ESRD and is anuric. Patient has hyperparathyroidism due to renal insufficiency. Nephro saw no indication for HD on 3/8. Getting HD today off normal MWF cycle.  ?- Daily renal function panel ?- Avoid nephrotoxic medications ?- Nephro consulted ?- No HD on 3/10 due to surgery ? ?#Dysphagia ?Patient has history of Barrett's esophagus and endorses difficulty with swallowing pills and occasional difficulty with solid foods. Seen by SLP who recommended to crush medication with puree. Recommended diet is solid foods with nectar thick liquids. ?- Change to nectar thick liquids ? ?Diet: Renal with fluid restriction ?VTE: Heparin IV ?IVF: none ?Code: DNR ? ?Prior to Admission Living Arrangement: SNF ?Anticipated Discharge Location: SNF ?Barriers to Discharge: Medical  stability ?Dispo: Anticipated discharge in approximately 4 day(s).  ? ?This is a Careers information officer Note.  The care of the patient was discussed with Dr. Dareen Piano and the assessment and plan formulated with their assistance.  Please see their attached note for official documentation of the daily encounter. ? ?Nelva Nay, Medical Student ?10/15/2021, 10:58 AM ?Pager: [phone] ?After 5pm on weekdays and 1pm on weekends: On Call pager [phone] ?  ?

## 2021-10-15 NOTE — Progress Notes (Addendum)
ANTICOAGULATION CONSULT NOTE - Initial Consult ? ?Pharmacy Consult for heparin ?Indication: atrial fibrillation ? ?No Known Allergies ? ?Patient Measurements: ?Height: '5\' 9"'$  (175.3 cm) ?Weight: 65 kg (143 lb 4.8 oz) ?IBW/kg (Calculated) : 70.7 ?Heparin Dosing Weight: 65 kg ? ?Vital Signs: ?Temp: 97.6 ?F (36.4 ?C) (03/09 0424) ?Temp Source: Oral (03/09 0424) ?BP: 109/75 (03/09 0424) ?Pulse Rate: 96 (03/09 0424) ? ?Labs: ?Recent Labs  ?  10/13/21 ?1310 10/14/21 ?0630 10/14/21 ?2200 10/15/21 ?1601  ?HGB 12.2* 11.4*  --  11.1*  ?HCT 38.1* 35.7*  --  35.7*  ?PLT 157 123*  --  153  ?APTT 34  --  36 60*  ?LABPROT 19.1*  --   --   --   ?INR 1.6*  --   --   --   ?HEPARINUNFRC  --   --   --  0.31  ?CREATININE 3.80* 3.81*  --   --   ? ? ? ?Estimated Creatinine Clearance: 17.5 mL/min (A) (by C-G formula based on SCr of 3.81 mg/dL (H)). ? ? ?Medical History: ?Past Medical History:  ?Diagnosis Date  ? Atrial fibrillation ([place])   ? Barrett's esophagus   ? Chronic kidney disease   ? CHRONIC\  ? Glomerulosclerosis, focal   ? Hyperparathyroidism due to renal insufficiency ([place])   ? Rheumatic fever   ?  ? ?Assessment: ?66 yo M on apixaban PTA for afib now held for AKA on 3/10. Last dose 3/7 AM. Patient is on dialysis. Pharmacy consulted for heparin.   ? ?aPTT 60 is just subtherapeutic on 1100 units/hr. Heparin level 0.31 is at low end of therapeutic and may be correlating with aPTT, which is earlier than expected for a dialysis patient. No issues with infusion or bleeding per RN. H/H, plt stable. Will increase rate slightly to ensure in goal and follow heparin levels from here.  ? ?Heparin to stop at 7am on 3/10 for surgery. Will hold AM heparin level  ? ?Goal of Therapy:  ?Heparin level 0.3-0.7 units/ml ?aPTT 66-102 seconds ?Monitor platelets by anticoagulation protocol: Yes ?  ?Plan:  ?Increase heparin 1150 units/hr  ?Monitor daily heparin level, CBC ?Monitor for signs/symptoms of bleeding  ?F/u restart apixaban post -op ? ? ?Benetta Spar, PharmD, BCPS, BCCP ?Clinical Pharmacist ? ?Please check AMION for all [place] phone numbers ?After 10:00 PM, call [place] [phone] ? ?

## 2021-10-15 NOTE — Progress Notes (Addendum)
[facility]  ? ?Subjective  - feeling better since being in the hiospital ? ? ?Objective ?127/89 ?92 ?97.7 ?F (36.5 ?C) (Oral) ?(!) 24 ?90% ? ?Intake/Output Summary (Last 24 hours) at 10/15/2021 0832 ?Last data filed at 10/15/2021 0425 ?Gross per 24 hour  ?Intake 836.65 ml  ?Output --  ?Net 836.65 ml  ? ? ?Left BKA with knee contracture and non healing stump with retained staples ?Right AKA non healing wound at incisional line ?Lungs non labored breathing ?Heart A fib ? ?Assessment/Planning: ?Non healing left BKA  ?Non healing right AKA ? ?Will make NPO for I & D verse AKA 10/16/21 with Dr. Scot Dock ?continue with cefepime and vancomycin  ?Heparin for Afib ?Afebrile  ? ?Roxy Horseman ?10/15/2021 ?8:32 AM ?-- ? ?Laboratory ?Lab Results: ?Recent Labs  ?  10/14/21 ?1751 10/15/21 ?0616  ?WBC 6.0 6.0  ?HGB 11.4* 11.1*  ?HCT 35.7* 35.7*  ?PLT 123* 153  ? ?BMET ?Recent Labs  ?  10/13/21 ?1310 10/14/21 ?0258  ?NA 140 137  ?K 3.8 3.6  ?CL 97* 97*  ?CO2 24 22  ?GLUCOSE 82 83  ?BUN 23 26*  ?CREATININE 3.80* 3.81*  ?CALCIUM 10.7* 10.3  ? ? ?COAG ?Lab Results  ?Component Value Date  ? INR 1.6 (H) 10/13/2021  ? INR 1.7 (H) 08/05/2021  ? INR 1.1 07/18/2021  ? ?No results found for: PTT ? ? ? ?

## 2021-10-16 ENCOUNTER — Encounter (HOSPITAL_COMMUNITY): Payer: Self-pay | Admitting: Internal Medicine

## 2021-10-16 ENCOUNTER — Encounter (HOSPITAL_COMMUNITY)
Admission: EM | Disposition: A | Discharge status: Hospice | Payer: Self-pay | Source: Ambulatory Visit | Attending: Internal Medicine

## 2021-10-16 ENCOUNTER — Inpatient Hospital Stay (HOSPITAL_COMMUNITY): Payer: Medicare Other | Admitting: Certified Registered"

## 2021-10-16 DIAGNOSIS — I739 Peripheral vascular disease, unspecified: Secondary | ICD-10-CM

## 2021-10-16 DIAGNOSIS — T8789 Other complications of amputation stump: Secondary | ICD-10-CM

## 2021-10-16 DIAGNOSIS — I4891 Unspecified atrial fibrillation: Secondary | ICD-10-CM

## 2021-10-16 DIAGNOSIS — T8189XA Other complications of procedures, not elsewhere classified, initial encounter: Secondary | ICD-10-CM | POA: Diagnosis not present

## 2021-10-16 DIAGNOSIS — J449 Chronic obstructive pulmonary disease, unspecified: Secondary | ICD-10-CM

## 2021-10-16 HISTORY — PX: AMPUTATION: SHX166

## 2021-10-16 LAB — CBC
HCT: 36 % — ABNORMAL LOW (ref 39.0–52.0)
Hemoglobin: 11.4 g/dL — ABNORMAL LOW (ref 13.0–17.0)
MCH: 29.8 pg (ref 26.0–34.0)
MCHC: 31.7 g/dL (ref 30.0–36.0)
MCV: 94 fL (ref 80.0–100.0)
Platelets: 124 10*3/uL — ABNORMAL LOW (ref 150–400)
RBC: 3.83 MIL/uL — ABNORMAL LOW (ref 4.22–5.81)
RDW: 18.6 % — ABNORMAL HIGH (ref 11.5–15.5)
WBC: 5.6 10*3/uL (ref 4.0–10.5)
nRBC: 0 % (ref 0.0–0.2)

## 2021-10-16 LAB — BASIC METABOLIC PANEL
Anion gap: 14 (ref 5–15)
BUN: 21 mg/dL (ref 8–23)
CO2: 24 mmol/L (ref 22–32)
Calcium: 9.7 mg/dL (ref 8.9–10.3)
Chloride: 99 mmol/L (ref 98–111)
Creatinine, Ser: 3.34 mg/dL — ABNORMAL HIGH (ref 0.61–1.24)
GFR, Estimated: 20 mL/min — ABNORMAL LOW (ref >60)
Glucose, Bld: 84 mg/dL (ref 70–99)
Potassium: 3.9 mmol/L (ref 3.5–5.1)
Sodium: 137 mmol/L (ref 135–145)

## 2021-10-16 LAB — SURGICAL PCR SCREEN
MRSA, PCR: NEGATIVE
Staphylococcus aureus: NEGATIVE

## 2021-10-16 SURGERY — AMPUTATION ABOVE KNEE
Anesthesia: General | Site: Knee | Laterality: Bilateral

## 2021-10-16 MED ORDER — DILTIAZEM HCL 60 MG PO TABS
30.0000 mg | ORAL_TABLET | Freq: Once | ORAL | Status: AC
  Administered 2021-10-16: 30 mg via ORAL
  Filled 2021-10-16: qty 1

## 2021-10-16 MED ORDER — OXYCODONE HCL 5 MG/5ML PO SOLN: 5.0000 mg | Freq: Once | ORAL | Status: DC | PRN

## 2021-10-16 MED ORDER — OXYCODONE HCL 5 MG PO TABS
5.0000 mg | ORAL_TABLET | ORAL | Status: DC | PRN
  Administered 2021-10-17 – 2021-10-20 (×7): 5 mg via ORAL
  Filled 2021-10-16 (×9): qty 1

## 2021-10-16 MED ORDER — PROPOFOL 10 MG/ML IV BOLUS
INTRAVENOUS | Status: AC
  Filled 2021-10-16: qty 20

## 2021-10-16 MED ORDER — ONDANSETRON HCL 4 MG/2ML IJ SOLN: 4.0000 mg | Freq: Once | INTRAMUSCULAR | Status: DC | PRN

## 2021-10-16 MED ORDER — PHENYLEPHRINE 40 MCG/ML (10ML) SYRINGE FOR IV PUSH (FOR BLOOD PRESSURE SUPPORT)
PREFILLED_SYRINGE | INTRAVENOUS | Status: DC | PRN
  Administered 2021-10-16 (×2): 80 ug via INTRAVENOUS

## 2021-10-16 MED ORDER — HYDROMORPHONE HCL 1 MG/ML IJ SOLN: 0.2500 mg | INTRAMUSCULAR | Status: DC | PRN

## 2021-10-16 MED ORDER — FENTANYL CITRATE (PF) 250 MCG/5ML IJ SOLN
INTRAMUSCULAR | Status: AC
  Filled 2021-10-16: qty 5

## 2021-10-16 MED ORDER — 0.9 % SODIUM CHLORIDE (POUR BTL) OPTIME
TOPICAL | Status: DC | PRN
  Administered 2021-10-16: 2000 mL

## 2021-10-16 MED ORDER — ORAL CARE MOUTH RINSE: 15.0000 mL | Freq: Once | OROMUCOSAL | Status: AC

## 2021-10-16 MED ORDER — PROPOFOL 10 MG/ML IV BOLUS
INTRAVENOUS | Status: DC | PRN
  Administered 2021-10-16: 100 mg via INTRAVENOUS

## 2021-10-16 MED ORDER — ONDANSETRON HCL 4 MG/2ML IJ SOLN
INTRAMUSCULAR | Status: DC | PRN
  Administered 2021-10-16: 4 mg via INTRAVENOUS

## 2021-10-16 MED ORDER — ACETAMINOPHEN 10 MG/ML IV SOLN: 1000.0000 mg | Freq: Once | INTRAVENOUS | Status: DC | PRN

## 2021-10-16 MED ORDER — OXYCODONE HCL 5 MG PO TABS: 5.0000 mg | ORAL_TABLET | Freq: Once | ORAL | Status: DC | PRN

## 2021-10-16 MED ORDER — VASOPRESSIN 20 UNIT/ML IV SOLN
INTRAVENOUS | Status: AC
  Filled 2021-10-16: qty 1

## 2021-10-16 MED ORDER — AMISULPRIDE (ANTIEMETIC) 5 MG/2ML IV SOLN: 10.0000 mg | Freq: Once | INTRAVENOUS | Status: DC | PRN

## 2021-10-16 MED ORDER — DILTIAZEM HCL-DEXTROSE 125-5 MG/125ML-% IV SOLN (PREMIX)
5.0000 mg/h | INTRAVENOUS | Status: DC
  Administered 2021-10-16: 5 mg/h via INTRAVENOUS
  Filled 2021-10-16 (×2): qty 125

## 2021-10-16 MED ORDER — SODIUM CHLORIDE 0.9 % IV SOLN: INTRAVENOUS | Status: DC

## 2021-10-16 MED ORDER — KETAMINE HCL 50 MG/5ML IJ SOSY
PREFILLED_SYRINGE | INTRAMUSCULAR | Status: AC
Start: 2021-10-16 — End: ?
  Filled 2021-10-16: qty 5

## 2021-10-16 MED ORDER — EPHEDRINE SULFATE-NACL 50-0.9 MG/10ML-% IV SOSY
PREFILLED_SYRINGE | INTRAVENOUS | Status: DC | PRN
  Administered 2021-10-16: 7.5 mg via INTRAVENOUS
  Administered 2021-10-16 (×2): 5 mg via INTRAVENOUS

## 2021-10-16 MED ORDER — CHLORHEXIDINE GLUCONATE 0.12 % MT SOLN
OROMUCOSAL | Status: AC
  Administered 2021-10-16: 15 mL via OROMUCOSAL
  Filled 2021-10-16: qty 15

## 2021-10-16 MED ORDER — LIDOCAINE 2% (20 MG/ML) 5 ML SYRINGE
INTRAMUSCULAR | Status: DC | PRN
  Administered 2021-10-16: 80 mg via INTRAVENOUS

## 2021-10-16 MED ORDER — CHLORHEXIDINE GLUCONATE 0.12 % MT SOLN: 15.0000 mL | Freq: Once | OROMUCOSAL | Status: AC

## 2021-10-16 MED ORDER — KETAMINE HCL 10 MG/ML IJ SOLN
INTRAMUSCULAR | Status: DC | PRN
  Administered 2021-10-16: 15 mg via INTRAVENOUS

## 2021-10-16 MED ORDER — ALBUMIN HUMAN 5 % IV SOLN: INTRAVENOUS | Status: DC | PRN

## 2021-10-16 MED ORDER — HEPARIN (PORCINE) 25000 UT/250ML-% IV SOLN
1250.0000 [IU]/h | INTRAVENOUS | Status: AC
  Administered 2021-10-16: 1150 [IU]/h via INTRAVENOUS
  Filled 2021-10-16 (×2): qty 250

## 2021-10-16 MED ORDER — PROPOFOL 1000 MG/100ML IV EMUL
INTRAVENOUS | Status: AC
  Filled 2021-10-16: qty 100

## 2021-10-16 MED ORDER — PHENYLEPHRINE HCL-NACL 20-0.9 MG/250ML-% IV SOLN
INTRAVENOUS | Status: AC
  Filled 2021-10-16: qty 250

## 2021-10-16 MED ORDER — PHENYLEPHRINE HCL-NACL 20-0.9 MG/250ML-% IV SOLN
INTRAVENOUS | Status: DC | PRN
  Administered 2021-10-16: 50 ug/min via INTRAVENOUS

## 2021-10-16 MED ORDER — FENTANYL CITRATE (PF) 100 MCG/2ML IJ SOLN
INTRAMUSCULAR | Status: DC | PRN
  Administered 2021-10-16: 50 ug via INTRAVENOUS

## 2021-10-16 MED ORDER — HYDROMORPHONE HCL 1 MG/ML IJ SOLN
0.5000 mg | INTRAMUSCULAR | Status: DC | PRN
  Administered 2021-10-16 – 2021-10-18 (×4): 0.5 mg via INTRAVENOUS
  Filled 2021-10-16 (×4): qty 0.5

## 2021-10-16 MED ORDER — ROCURONIUM BROMIDE 10 MG/ML (PF) SYRINGE
PREFILLED_SYRINGE | INTRAVENOUS | Status: DC | PRN
  Administered 2021-10-16: 50 mg via INTRAVENOUS

## 2021-10-16 MED ORDER — SUGAMMADEX SODIUM 200 MG/2ML IV SOLN
INTRAVENOUS | Status: DC | PRN
  Administered 2021-10-16: 400 mg via INTRAVENOUS

## 2021-10-16 MED ORDER — VASOPRESSIN 20 UNIT/ML IV SOLN
INTRAVENOUS | Status: DC | PRN
  Administered 2021-10-16: 20 [IU] via INTRAVENOUS
  Administered 2021-10-16: 2 [IU] via INTRAVENOUS
  Administered 2021-10-16: 1 [IU] via INTRAVENOUS

## 2021-10-16 MED ORDER — MIDAZOLAM HCL 2 MG/2ML IJ SOLN
INTRAMUSCULAR | Status: AC
  Filled 2021-10-16: qty 2

## 2021-10-16 SURGICAL SUPPLY — 54 items
BAG COUNTER SPONGE SURGICOUNT (BAG) ×2
BAG SPNG CNTER NS LX DISP (BAG) ×1
BLADE SAGITTAL (BLADE) ×2
BLADE SAGITTAL 25.0X1.19X90 (BLADE)
BLADE SAW GIGLI 510 (BLADE) ×2
BLADE SAW THK.89X75X18XSGTL (BLADE) ×1
BNDG CMPR 9X6 STRL LF SNTH (GAUZE/BANDAGES/DRESSINGS) ×1
BNDG COHESIVE 6X5 TAN STRL LF (GAUZE/BANDAGES/DRESSINGS) ×2
BNDG ELASTIC 4X5.8 VLCR STR LF (GAUZE/BANDAGES/DRESSINGS)
BNDG ELASTIC 6X5.8 VLCR STR LF (GAUZE/BANDAGES/DRESSINGS) ×4
BNDG ESMARK 6X9 LF (GAUZE/BANDAGES/DRESSINGS) ×2
BNDG GAUZE ELAST 4 BULKY (GAUZE/BANDAGES/DRESSINGS) ×2
CANISTER SUCT 3000ML PPV (MISCELLANEOUS) ×2
CLIP LIGATING EXTRA MED SLVR (CLIP) ×2
CLIP LIGATING EXTRA SM BLUE (MISCELLANEOUS) ×2
COVER SURGICAL LIGHT HANDLE (MISCELLANEOUS) ×2
CUFF TOURN SGL QUICK 24 (TOURNIQUET CUFF)
CUFF TOURN SGL QUICK 34 (TOURNIQUET CUFF)
CUFF TRNQT CYL 24X4X16.5-23 (TOURNIQUET CUFF)
CUFF TRNQT CYL 34X4.125X (TOURNIQUET CUFF)
DRAIN CHANNEL 19F RND (DRAIN)
DRAPE HALF SHEET 40X57 (DRAPES) ×2
DRAPE ORTHO SPLIT 77X108 STRL (DRAPES) ×4
DRAPE SURG ORHT 6 SPLT 77X108 (DRAPES) ×2
DRSG ADAPTIC 3X8 NADH LF (GAUZE/BANDAGES/DRESSINGS) ×2
DRSG PREVENA PLUS CUSTOM (GAUZE/BANDAGES/DRESSINGS)
ELECT CAUTERY BLADE 6.4 (BLADE) ×2
ELECT REM PT RETURN 9FT ADLT (ELECTROSURGICAL) ×2
EVACUATOR SILICONE 100CC (DRAIN)
GAUZE SPONGE 4X4 12PLY STRL (GAUZE/BANDAGES/DRESSINGS) ×2
GAUZE SPONGE 4X4 12PLY STRL LF (GAUZE/BANDAGES/DRESSINGS) ×4
GLOVE SURG ENC MOIS LTX SZ7.5 (GLOVE) ×2
GOWN STRL REUS W/ TWL LRG LVL3 (GOWN DISPOSABLE) ×2
GOWN STRL REUS W/ TWL XL LVL3 (GOWN DISPOSABLE) ×1
GOWN STRL REUS W/TWL LRG LVL3 (GOWN DISPOSABLE) ×4
GOWN STRL REUS W/TWL XL LVL3 (GOWN DISPOSABLE) ×2
KIT BASIN OR (CUSTOM PROCEDURE TRAY) ×2
KIT TURNOVER KIT B (KITS) ×2
NS IRRIG 1000ML POUR BTL (IV SOLUTION) ×2
PACK GENERAL/GYN (CUSTOM PROCEDURE TRAY) ×2
PAD ARMBOARD 7.5X6 YLW CONV (MISCELLANEOUS) ×4
SPONGE T-LAP 18X18 ~~LOC~~+RFID (SPONGE) ×8
STAPLER VISISTAT 35W (STAPLE) ×4
STOCKINETTE IMPERVIOUS LG (DRAPES) ×2
SUT ETHILON 3 0 PS 1 (SUTURE)
SUT SILK 0 TIES 10X30 (SUTURE) ×2
SUT SILK 2 0 (SUTURE) ×2
SUT SILK 2-0 18XBRD TIE 12 (SUTURE) ×1
SUT SILK 3 0 (SUTURE)
SUT SILK 3-0 18XBRD TIE 12 (SUTURE)
SUT VIC AB 2-0 CT1 18 (SUTURE) ×10
TOWEL GREEN STERILE (TOWEL DISPOSABLE) ×4
UNDERPAD 30X36 HEAVY ABSORB (UNDERPADS AND DIAPERS) ×8
WATER STERILE IRR 1000ML POUR (IV SOLUTION) ×2

## 2021-10-16 NOTE — Progress Notes (Addendum)
? LOS: 3 days  ?Patient Summary: Scott Castro is a 66 year old male with a past medical history of paroxysmal atrial fibrillation on eliquis, ESRD on HD MWF (19 years) s/p failed kidney transplant, Barrett's esophagus, and critical limb ischemia leading to bilateral BKA and R AKA, who presents with a non-healing wound on his L surgical site and concern for infection. ? ?Overnight Events: Patient had increased heart rate to 150s at rest. He was asymptomatic. He was given one dose of diltiazem '30mg'$  at 0134 with some decrease in heart rate. ? ?Interim History: Patient was seen and evaluated at bedside by team on rounds. Patient had difficulty speaking and dry mouth due to being NPO since midnight. He denies any chest pain, dyspnea, headache, lightheadedness, palpitations, or abdominal pain. He denies left leg pain. ? ?Objective: ?Vital signs in last 24 hours: ?Vitals:  ? 10/16/21 0507 10/16/21 0743 10/16/21 0912 10/16/21 1002  ?BP:  115/80 129/82 (!) 168/119  ?Pulse:  (!) 117 (!) 101   ?Resp:  19 18   ?Temp:  97.6 ?F (36.4 ?C) 97.8 ?F (36.6 ?C)   ?TempSrc:  Oral Oral   ?SpO2:  93% 96%   ?Weight: 68.6 kg  68.6 kg   ?Height:   '5\' 9"'$  (1.753 m)   ? ?SpO2: 96 % ?Weight change:  ? ?Intake/Output Summary (Last 24 hours) at 10/16/2021 1120 ?Last data filed at 10/16/2021 0507 ?Gross per 24 hour  ?Intake 902.39 ml  ?Output 200 ml  ?Net 702.39 ml  ? ? Physical Exam ?Vitals reviewed.  ?Constitutional:   ?   General: Patient laying in bed in no acute distress. Difficulty speaking due to dry mouth. He appears malnourished, with prominent ribcage. AV fistula in L arm. ?HENT:  ?   Head: Normocephalic and atraumatic.  ?   Nose: Nose normal.  ?Eyes:  ?   Extraocular Movements: Extraocular movements intact.  ?   Conjunctiva/sclera: Conjunctivae normal.  ?   Pupils: Pupils are equal, round, and reactive to light.  ?Cardiovascular:  ?   Rate and Rhythm: Rhythm irregular, tachycardic.  ?   Pulses: Normal pulses.  ?   Heart sounds: Murmur  heard.  ?   Comments: Irregular rate and rhythm. Referred sound from AV fistula. ?Pulmonary:  ?   Effort: Pulmonary effort is normal. No respiratory distress.  ?   Breath sounds: Normal breath sounds. No wheezing, rhonchi or rales.  ?Abdominal:  ?   General: Abdomen is flat. There is no distension.  ?   Palpations: Abdomen is soft.  ?   Tenderness: There is no abdominal tenderness to palpation.  ?   Comments: Bowel sounds heard on auscultation. ?Skin: ?   General: Skin is warm and dry.  ?   Findings: Lesion present.  ?   Comments: Slight erythema surrounding surgical staples on R leg. Left leg is wrapped in bandage below the knee.  ?Neurological:  ?   General: No focal deficit present.  ?   Mental Status: He is alert and oriented to person, place, and time.  ?   Sensory: No sensory deficit.  ? ? ?Lab Results: ?CBC ?CBC Latest Ref Rng & Units 10/16/2021 10/15/2021 10/14/2021  ?WBC 4.0 - 10.5 K/uL 5.6 6.0 6.0  ?Hemoglobin 13.0 - 17.0 g/dL 11.4(L) 11.1(L) 11.4(L)  ?Hematocrit 39.0 - 52.0 % 36.0(L) 35.7(L) 35.7(L)  ?Platelets 150 - 400 K/uL 124(L) 153 123(L)  ? ?BMP Latest Ref Rng & Units 10/16/2021 10/15/2021 10/14/2021  ?Glucose 70 - 99 mg/dL  84 128(H) 83  ?BUN 8 - 23 mg/dL 21 32(H) 26(H)  ?Creatinine 0.61 - 1.24 mg/dL 3.34(H) 4.66(H) 3.81(H)  ?Sodium 135 - 145 mmol/L 137 138 137  ?Potassium 3.5 - 5.1 mmol/L 3.9 3.9 3.6  ?Chloride 98 - 111 mmol/L 99 98 97(L)  ?CO2 22 - 32 mmol/L '24 24 22  '$ ?Calcium 8.9 - 10.3 mg/dL 9.7 9.9 10.3  ?  ?EKG: Atrial fibrillation with RVR, rate 119. Unchanged from prior on 3/7. ? ?Medications: ?Scheduled Meds: ? [MAR Hold] atorvastatin  10 mg Oral QHS  ? [MAR Hold] Chlorhexidine Gluconate Cloth  6 each Topical Q0600  ? [MAR Hold] gabapentin  100 mg Oral BID  ? [MAR Hold] Gerhardt's butt cream   Topical Daily  ? [MAR Hold] midodrine  10 mg Oral TID WC  ? [MAR Hold] sertraline  100 mg Oral Daily  ? ?Continuous Infusions: ? sodium chloride    ? [MAR Hold] ceFEPime (MAXIPIME) IV Stopped (10/15/21 1542)  ?  [MAR Hold] diltiazem (CARDIZEM) infusion 5 mg/hr (10/16/21 0855)  ? [MAR Hold] sodium thiosulfate infusion for calciphylaxis    ? [MAR Hold] vancomycin Stopped (10/15/21 1800)  ? ?PRN Meds:.0.9 % irrigation (POUR BTL), [MAR Hold] acetaminophen **OR** [MAR Hold] acetaminophen, [MAR Hold] heparin, [MAR Hold] ondansetron **OR** [MAR Hold] ondansetron (ZOFRAN) IV, [MAR Hold] senna-docusate, [MAR Hold] simethicone ?Assessment/Plan: ?Principal Problem: ?  Nonhealing surgical wound ? ?#Non-healing wound on LLE ?#Sepsis ruleout ?#Hypotension ?#Cellulitis ?Patient has history of R BKA on 08/07/21, L BKA on 09/02/21, and revision to R AKA on 09/09/21 due to poor healing. Started on Meropenem '500mg'$  IV on 3/4. Antibiotic changed to cefepime 2g IV and vancomycin '1500mg'$  IV in the ED. MRI of L leg showed infectious process in soft tissues distal to amputation stump concerning for cellulitis and marrow edema within the stump suspicious for osteomyelitis. Blood cx with no growth to date. L AKA surgery on 10/16/21. ?- continue cefepime 1g IV Q24H ?- vancomycin IV with iHD with pharmacy dosing ?- trend fever curve, CBC ?- midodrine '10mg'$  TID ?  ?#Paroxysmal Atrial Fibrillation w/ RVR ?Patient with history of pAF on eliquis. Patient home medications include metoprolol '25mg'$  BID, diltiazem '120mg'$ , and Eliquis '5mg'$  BID. Transitioned from diltiazem drip to home dose of diltiazem '120mg'$  PO. Had elevated HR to 150s overnight and received diltiazem '30mg'$  without adequate control of heart rate.  Patient was tachycardic this morning into the 120s. ?- restart diltiazem drip for rate control today.  We will reassess patient postop and determine dose of oral Cardizem to initiate.  I suspect that his heart rates should improve once his infection is controlled ?- continue holding eliquis prior to surgery on Friday, plan to restart post-op ? ?#ESRD, anuric, s/p failed kidney transplant on hemodialysis ?Patient has ESRD and is anuric. Patient has  hyperparathyroidism due to renal insufficiency. Got HD 3/9. Creatinine is 3.34 which is baseline. ?- Daily renal function panel ?- Avoid nephrotoxic medications ?- Nephro consulted ?- HD 3/11 off schedule  ? ?Diet: Renal with fluid restriction ?VTE: Eliquis ?IVF: none ?Code: DNR ? ?Prior to Admission Living Arrangement: SNF ?Anticipated Discharge Location: SNF ?Barriers to Discharge: Medical stability ?Dispo: Anticipated discharge in approximately 3 day(s).  ? ?This is a Careers information officer Note.  The care of the patient was discussed with Dr. Dareen Piano and the assessment and plan formulated with their assistance.  Please see their attached note for official documentation of the daily encounter. ? ?Nelva Nay, Medical Student ?10/16/2021, 11:20 AM ?Pager: [phone] ?  After 5pm on weekdays and 1pm on weekends: On Call pager [phone] ?  ?

## 2021-10-16 NOTE — Progress Notes (Signed)
?  Progress Note ? ? ? ?10/16/2021 ?9:26 AM ?Day of Surgery ? ?Subjective:  no overnight issues ? ?Vitals:  ? 10/16/21 0743 10/16/21 0912  ?BP: 115/80 129/82  ?Pulse: (!) 117 (!) 101  ?Resp: 19 18  ?Temp: 97.6 ?F (36.4 ?C) 97.8 ?F (36.6 ?C)  ?SpO2: 93% 96%  ? ? ?Physical Exam: ?Awake and alert ?Non labored respirations ?Dressings in place right aka, left bka ? ?CBC ?   ?Component Value Date/Time  ? WBC 5.6 10/16/2021 0124  ? RBC 3.83 (L) 10/16/2021 0124  ? HGB 11.4 (L) 10/16/2021 0124  ? HGB 11.4 (L) 06/11/2012 0501  ? HCT 36.0 (L) 10/16/2021 0124  ? HCT 34.6 (L) 06/11/2012 0501  ? PLT 124 (L) 10/16/2021 0124  ? PLT 251 06/11/2012 0501  ? MCV 94.0 10/16/2021 0124  ? MCV 95 06/11/2012 0501  ? MCH 29.8 10/16/2021 0124  ? MCHC 31.7 10/16/2021 0124  ? RDW 18.6 (H) 10/16/2021 0124  ? RDW 14.4 06/11/2012 0501  ? LYMPHSABS 1.2 10/13/2021 1310  ? LYMPHSABS 1.2 06/11/2012 0501  ? MONOABS 0.6 10/13/2021 1310  ? MONOABS 0.7 06/11/2012 0501  ? EOSABS 0.0 10/13/2021 1310  ? EOSABS 0.2 06/11/2012 0501  ? BASOSABS 0.0 10/13/2021 1310  ? BASOSABS 0.1 06/11/2012 0501  ? ? ?BMET ?   ?Component Value Date/Time  ? NA 137 10/16/2021 0124  ? NA 142 06/10/2012 0924  ? K 3.9 10/16/2021 0124  ? K 4.5 06/16/2013 1315  ? CL 99 10/16/2021 0124  ? CL 102 06/10/2012 0924  ? CO2 24 10/16/2021 0124  ? CO2 29 06/10/2012 0924  ? GLUCOSE 84 10/16/2021 0124  ? GLUCOSE 83 06/10/2012 0924  ? BUN 21 10/16/2021 0124  ? BUN 35 (H) 06/10/2012 1517  ? CREATININE 3.34 (H) 10/16/2021 0124  ? CREATININE 10.29 (H) 06/10/2012 6160  ? CALCIUM 9.7 10/16/2021 0124  ? CALCIUM 9.2 06/10/2012 0924  ? GFRNONAA 20 (L) 10/16/2021 0124  ? GFRNONAA 5 (L) 06/10/2012 7371  ? GFRAA 9 (L) 09/18/2019 0430  ? GFRAA 6 (L) 06/10/2012 0626  ? ? ?INR ?   ?Component Value Date/Time  ? INR 1.6 (H) 10/13/2021 1310  ? ? ? ?Intake/Output Summary (Last 24 hours) at 10/16/2021 0926 ?Last data filed at 10/16/2021 0507 ?Gross per 24 hour  ?Intake 902.39 ml  ?Output 200 ml  ?Net 702.39 ml   ? ? ? ?Assessment:  66 y.o. male is here with non healing bilateral amputations ? ?Plan: ?-left aka ?-revision right aka ?-will need nutritional support to heal wounds ? ? ? ?Kaiyden Simkin C. Donzetta Matters, MD ?[facility] ?Office: [phone] ?Pager: [phone] ? ?10/16/2021 ?9:26 AM ? ?

## 2021-10-16 NOTE — Progress Notes (Signed)
Pharmacy Antibiotic Note ? ?Scott Castro is a 66 y.o. male admitted on 10/13/2021 with nonhealing L BKA wound infection.  Pharmacy has been consulted for vancomycin and cefepime. Patient is ESRD on iHD MWF.  ? ?Patient got extra HD session 3/9. Vancomycin '500mg'$  given post-HD but cefepime was given pre-HD. Patient has gone to OR. RN to give cefepime when returns to room.  ? ?Plan: ?Vancomycin '500mg'$  Post-HD MWF ?Cefepime 1g q24h  ?Monitor iHD schedule, cultures and clinical progression ?Vancomycin levels as appropriate ? ? ?Height: '5\' 9"'$  (175.3 cm) ?Weight: 68.6 kg (151 lb 3.8 oz) ?IBW/kg (Calculated) : 70.7 ? ?Temp (24hrs), Avg:97.6 ?F (36.4 ?C), Min:96.9 ?F (36.1 ?C), Max:98.2 ?F (36.8 ?C) ? ?Recent Labs  ?Lab 10/13/21 ?1310 10/13/21 ?2114 10/14/21 ?6759 10/15/21 ?1638 10/15/21 ?1050 10/16/21 ?0124  ?WBC 5.9  --  6.0 6.0  --  5.6  ?CREATININE 3.80*  --  3.81*  --  4.66* 3.34*  ?LATICACIDVEN 1.7 1.3  --   --   --   --   ? ?  ?Estimated Creatinine Clearance: 21.1 mL/min (A) (by C-G formula based on SCr of 3.34 mg/dL (H)).   ? ?No Known Allergies ? ?Antimicrobials this admission: ?Vancomycin 3/7 >> ?Cefepime 3/7 >>  ?  ? ?Microbiology results: ?3/7 bcx: ngtd ?3/10 MRSA neg ? ?Thank you for allowing pharmacy to be a part of this patient?s care. ? ?Benetta Spar, PharmD, BCPS, BCCP ?Clinical Pharmacist ? ?Please check AMION for all [place] phone numbers ?After 10:00 PM, call [place] [phone] ? ?

## 2021-10-16 NOTE — Anesthesia Postprocedure Evaluation (Signed)
Anesthesia Post Note ? ?Patient: Scott Castro ? ?Procedure(s) Performed: LEFT ABOVE KNEE AMPUTATION, REVISION OF RIGHT ABOVE KNEE APMPUTATION (Bilateral: Knee) ? ?  ? ?Patient location during evaluation: PACU ?Anesthesia Type: General ?Level of consciousness: awake and alert ?Pain management: pain level controlled ?Vital Signs Assessment: post-procedure vital signs reviewed and stable ?Respiratory status: spontaneous breathing, nonlabored ventilation, respiratory function stable and patient connected to nasal cannula oxygen ?Cardiovascular status: blood pressure returned to baseline and stable ?Postop Assessment: no apparent nausea or vomiting ?Anesthetic complications: no ? ? ?No notable events documented. ? ?Last Vitals:  ?Vitals:  ? 10/16/21 1245 10/16/21 1308  ?BP: (!) 109/59 99/79  ?Pulse: 96 98  ?Resp: (!) 21 20  ?Temp:  36.7 ?C  ?SpO2: 96% 96%  ?  ?Last Pain:  ?Vitals:  ? 10/16/21 1437  ?TempSrc:   ?PainSc: 1   ? ? ?  ?  ?  ?  ?  ?  ? ?[place] ? ? ? ? ?

## 2021-10-16 NOTE — Progress Notes (Signed)
SLP Cancellation Note ? ?Patient Details ?Name: Scott Castro ?MRN: [id number] ?DOB: 1955-09-02 ? ? ?Cancelled treatment:        Followed up after MBS yesterday however he is NPO for surgery. When diet is placed after surgery please add that he is on nectar thick liquids. Thank you  ? ? ? ?Houston Siren ?10/16/2021, 8:35 AM ?

## 2021-10-16 NOTE — Progress Notes (Signed)
[facility] ?Progress Note ? ?Subjective: seen in room, awaiting HD today ? ?Vitals:  ? 10/16/21 1215 10/16/21 1230 10/16/21 1245 10/16/21 1308  ?BP: (!) 151/73 104/90 (!) 109/59 99/79  ?Pulse: 95 92 96 98  ?Resp: (!) 21 20 (!) 21 20  ?Temp:    98 ?F (36.7 ?C)  ?TempSrc:    Oral  ?SpO2: 99% 97% 96% 96%  ?Weight:      ?Height:      ? ? ?Exam: ?Gen alert, frail, nasal cann O2, no distress ?No rash, cyanosis or gangrene ?Sclera anicteric, throat clear  ?No jvd or bruits ?Chest clear bilat to bases, no rales/ wheezing ?RRR no RG ?Abd soft ntnd no mass or ascites +bs ?GU normal male ?MS no joint effusions or deformity ?Ext bilat AKA, no edema ?Neuro is alert, Ox 3 , nf ?  LUA AVF+bruit  ?  ?  ?  ?  ? Home meds include - eliquis 5 bid, lipitor, neurontin 100 bid, midodrine 10 tid, zoloft,  ?  ?  BP's soft 88- 115/ 55- 83, HR 111  , RR 13- 25 afeb  ?  ? OP HD: MWF [place] (was [employer] [facility])    ?    4h  400/600  60.3kg  2/2.5 bath  LUA AVG  Hep none ? - hect 3 ug tiw IV (dc'd here) ? - sodium thiosulfate 25 gm tiw IV  ? - home: renvela packet 2.4gm ac tid ?  ?  ?  ?Assessment/ Plan: ?Wound infection of BKA stump - IV abx per pmd. SP new L AKA and revision of R AKA today 3/10 by VVS.  ?Hypotension - on midodrine 10 tid, BP's better.   ?Volume - wt's up but no vol ^ on exam. UFG 2-2.5 as tol next hd.  ?Afib RVR - per pmd ?ESRD - on HD MWF. Had HD yesterday. Next HD tomorrow off schedule.  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 Phos in range, cont binder.  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 DC'd hectorol.  ?DNR ?  ?  ?  ? ? ? ? ?Rob Larisa Lanius ?10/16/2021, 3:47 PM ? ? ?Recent Labs  ?Lab 10/14/21 ?4356 10/15/21 ?8616 10/15/21 ?1050 10/16/21 ?0124  ?K 3.6  --  3.9 3.9  ?BUN 26*  --  32* 21  ?CREATININE 3.81*  --  4.66* 3.34*  ?ALBUMIN 2.0*  --  1.8*  --   ?CALCIUM 10.3  --  9.9 9.7  ?PHOS 5.1*  --  5.3*  --    ?HGB 11.4* 11.1*  --  11.4*  ? ? ?Inpatient medications: ? atorvastatin  10 mg Oral QHS  ? Chlorhexidine Gluconate Cloth  6 each Topical Q0600  ? gabapentin  100 mg Oral BID  ? Gerhardt's butt cream   Topical Daily  ? midodrine  10 mg Oral TID WC  ? sertraline  100 mg Oral Daily  ? ? ceFEPime (MAXIPIME) IV 1 g (10/16/21 1402)  ? diltiazem (CARDIZEM) infusion 5 mg/hr (10/16/21 1208)  ? heparin    ? sodium thiosulfate infusion for calciphylaxis    ? vancomycin Stopped (10/15/21 1800)  ? ?acetaminophen **OR** acetaminophen, heparin, HYDROmorphone (DILAUDID) injection, ondansetron **OR** ondansetron (ZOFRAN) IV, oxyCODONE, senna-docusate, simethicone ? ? ? ? ? ? ?

## 2021-10-16 NOTE — Progress Notes (Signed)
ANTICOAGULATION CONSULT NOTE - Initial Consult ? ?Pharmacy Consult for heparin ?Indication: atrial fibrillation ? ?No Known Allergies ? ?Patient Measurements: ?Height: '5\' 9"'$  (175.3 cm) ?Weight: 68.6 kg (151 lb 3.8 oz) ?IBW/kg (Calculated) : 70.7 ?Heparin Dosing Weight: 65 kg ? ?Vital Signs: ?Temp: 98 ?F (36.7 ?C) (03/10 1308) ?Temp Source: Oral (03/10 1308) ?BP: 99/79 (03/10 1308) ?Pulse Rate: 98 (03/10 1308) ? ?Labs: ?Recent Labs  ?  10/14/21 ?0633 10/14/21 ?2200 10/15/21 ?0616 10/15/21 ?1050 10/16/21 ?0124  ?HGB 11.4*  --  11.1*  --  11.4*  ?HCT 35.7*  --  35.7*  --  36.0*  ?PLT 123*  --  153  --  124*  ?APTT  --  36 60*  --   --   ?HEPARINUNFRC  --   --  0.31  --   --   ?CREATININE 3.81*  --   --  4.66* 3.34*  ? ? ? ?Estimated Creatinine Clearance: 21.1 mL/min (A) (by C-G formula based on SCr of 3.34 mg/dL (H)). ? ? ?Medical History: ?Past Medical History:  ?Diagnosis Date  ? Atrial fibrillation ([place])   ? Barrett's esophagus   ? Chronic kidney disease   ? CHRONIC\  ? Glomerulosclerosis, focal   ? Hyperparathyroidism due to renal insufficiency ([place])   ? Rheumatic fever   ?  ? ?Assessment: ?66 yo M on apixaban PTA for afib now held for AKA on 3/10. Last dose 3/7 AM. Patient is on dialysis. Pharmacy consulted for heparin.   ? ?Heparin stopped at 7am today for surgery. Team reordered it to start at 1600.  ? ?Goal of Therapy:  ?Heparin level 0.3-0.7 units/ml ?aPTT 66-102 seconds ?Monitor platelets by anticoagulation protocol: Yes ?  ?Plan:  ?Continue heparin 1150 units/hr  ?Monitor daily heparin level, CBC ?Monitor for signs/symptoms of bleeding  ?F/u restart apixaban post -op ? ? ?Benetta Spar, PharmD, BCPS, BCCP ?Clinical Pharmacist ? ?Please check AMION for all [place] phone numbers ?After 10:00 PM, call [place] [phone] ? ?

## 2021-10-16 NOTE — Op Note (Signed)
? ? ?  Patient name: Scott Castro MRN: [id number] DOB: 1956-04-15 Sex: male ? ?10/16/2021 ?Pre-operative Diagnosis: Nonhealing left below-knee amputation, nonhealing right above knee amputation ?Post-operative diagnosis:  Same ?Surgeon:  Eda Paschal. Donzetta Matters, MD ?Procedure Performed: ?1.  Revision of left below-knee amputation with conversion to above-knee amputation ?2.  Revision of right above-knee amputation to higher above-knee amputation ? ? ?Indications: 66 year old male with end-stage renal disease he is now admitted with bilateral lower extremity wounds.  The right side has a nonhealing above-knee amputation left side has nonhealing below-knee amputation and both of these now require revision to higher above-knee amputations. ? ?An experienced assistant was necessary to help expedite this case and a very sick patient.  Specifically she was necessary for providing counter tension and retraction to divide both femurs and for ligation of blood vessels and with assistance of closure of both above-knee amputation sites. ? ?Findings: There is significant subcutaneous edema bilaterally.  Tissue appears viable and at completion.  The anterior posterior flap from approximated without tension. ? ?If either of these wounds fail to heal he would be best served with palliation rather than further attempts at amputation. ?  ?Procedure:  The patient was identified in the holding area and taken to the operating where is placed upon upper table general anesthesia was induced.  He was sterilely prepped draped in the bilateral lower extremities the right side which was a previous above-knee amputation of the left side which is a previous below-knee amputation.  Timeout was called.  Antibiotics were up-to-date.  We began with the left side creating a fishmouth type incision above the knee.  We dissected down through significant subcutaneous edema down to the muscle this was all divided down to the level of the bone.  The assistant  facilitated exposure of the bone and this was divided with power saw.  The neurovascular bundle was clamped and amputation knife was used to create a posterior flap.  The nerve was pulled on tension tied off and divided.  I suture-ligated the vascular structures.  The bone was smoothed with rasp.  The wound was thoroughly irrigated.  The assistant facilitated closure of this wound by reapproximating the anterior posterior fascial layers and then the skin was closed with staples.  Concomitantly I turned attention to the right side and create a fishmouth type incision around the old incision.  Dissected again down to the level of bone.  The assistant was necessary to facilitate exposure of bone and this was divided again with sterile and a posterior flap was created with knife.  There is no exposure of the neurovascular bundle at this time.  The bone was again smoothed with rasp.  The wound was thoroughly irrigated.  The anterior posterior flaps were reapproximated 2-0 Vicryl suture.  Again staples were placed at the skin.  Sterile dressings were placed on both sites.  He was awakened from anesthesia having tolerated without any complication.  Counts were correct at completion. ? ?EBL: 200 cc ? ?Nene Aranas C. Donzetta Matters, MD ?[facility] ?Office: [phone] ?Pager: [phone] ? ? ?

## 2021-10-16 NOTE — TOC Initial Note (Addendum)
Transition of Care (TOC) - Initial/Assessment Note  ? ? ?Patient Details  ?Name: Scott Castro ?MRN: [id number] ?Date of Birth: 1956-05-04 ? ?Transition of Care (TOC) CM/SW Contact:    ?Reece Agar, LCSWA ?Phone Number: ?10/16/2021, 3:14 PM ? ?Clinical Narrative:                 ?Pt is from [place] and will return to [place] once medically stable. CSW spoke with Shirlee Limerick at [facility] to follow up on pt status, Shirlee Limerick is expecting pt to return and is able to accept him over the weekend. CSW will continue to follow for DC planning needs.  ? ?Expected Discharge Plan: [place] ?Barriers to Discharge: Continued Medical Work up ? ? ?Patient Goals and CMS Choice ?Patient states their goals for this hospitalization and ongoing recovery are:: Rehab ?CMS Medicare.gov Compare Post Acute Care list provided to:: Patient ?Choice offered to / list presented to : Patient ? ?Expected Discharge Plan and Services ?Expected Discharge Plan: [place] ?In-house Referral: Clinical Social Work ?  ?  ?Living arrangements for the past 2 months: [place] ?                ?  ?  ?  ?  ?  ?  ?  ?  ?  ?  ? ?Prior Living Arrangements/Services ?Living arrangements for the past 2 months: [place] ?Lives with:: Facility Resident ?Patient language and need for interpreter reviewed:: Yes ?Do you feel safe going back to the place where you live?: Yes      ?Need for Family Participation in Patient Care: No (Comment) ?Care giver support system in place?: Yes (comment) ?  ?Criminal Activity/Legal Involvement Pertinent to Current Situation/Hospitalization: No - Comment as needed ? ?Activities of Daily Living ?Home Assistive Devices/Equipment: None ?ADL Screening (condition at time of admission) ?Patient's cognitive ability adequate to safely complete daily activities?: Yes ?Is the patient deaf or have difficulty hearing?: No ?Does the patient have difficulty seeing, even when  wearing glasses/contacts?: No ?Does the patient have difficulty concentrating, remembering, or making decisions?: No ?Patient able to express need for assistance with ADLs?: Yes ?Does the patient have difficulty dressing or bathing?: No ?Independently performs ADLs?: Yes (appropriate for developmental age) ?Does the patient have difficulty walking or climbing stairs?: No ?Weakness of Legs: None ?Weakness of Arms/Hands: None ? ?Permission Sought/Granted ?Permission sought to share information with : Facility Sport and exercise psychologist, Family Supports ?Permission granted to share information with : Yes, Verbal Permission Granted ? Share Information with NAME: Blevins,Lori (Relative)   [phone] ? Permission granted to share info w AGENCY: [employer] ? Permission granted to share info w Relationship: Blevins,Lori (Relative)   [phone] ? Permission granted to share info w Contact Information: Christia Reading (Relative)   [phone] ? ?Emotional Assessment ?Appearance:: Appears stated age ?Attitude/Demeanor/Rapport: Engaged ?Affect (typically observed): Accepting ?Orientation: : Oriented to Self, Oriented to Place, Oriented to  Time, Oriented to Situation ?Alcohol / Substance Use: Not Applicable ?Psych Involvement: No (comment) ? ?Admission diagnosis:  Nonhealing surgical wound [T81.89XA] ?Atrial fibrillation with RVR ([place]) [I48.91] ?ESRD on hemodialysis ([place]) [N18.6, Z99.2] ?Non-healing surgical wound, initial encounter [T81.89XA] ?Dysphagia, unspecified type [R13.10] ?On apixaban therapy [Z79.01] ?Patient Active Problem List  ? Diagnosis Date Noted  ? Nonhealing surgical wound 10/13/2021  ? Diarrhea, unspecified 10/05/2021  ? Other disorders of calcium metabolism 09/28/2021  ? Allergy, unspecified, initial encounter 09/18/2021  ? Anaphylactic shock, unspecified, initial encounter 09/18/2021  ?  Pain, unspecified 09/18/2021  ? Acquired absence of left leg below knee ([place]) 09/17/2021  ? Dependence on renal dialysis  ([place]) 09/17/2021  ? Hypertensive chronic kidney disease with stage 5 chronic kidney disease or end stage renal disease ([place]) 09/17/2021  ? Iron deficiency anemia, unspecified 09/17/2021  ? Kidney transplant failure 09/17/2021  ? Long term (current) use of anticoagulants 09/17/2021  ? Personal history of COVID-19 09/17/2021  ? Pulmonary hypertension, unspecified ([place]) 09/17/2021  ? Renal osteodystrophy 09/17/2021  ? Secondary hyperparathyroidism of renal origin ([place]) 09/17/2021  ? Protein-calorie malnutrition, severe 09/15/2021  ? AF (paroxysmal atrial fibrillation) ([place]) 08/30/2021  ? Depressive reaction   ? S/P BKA (below knee amputation), right ([place]) 08/12/2021  ? Ischemic foot ulcer due to atherosclerosis of native artery of limb ([place]) 08/05/2021  ? Atrial fibrillation ([place]) 08/05/2021  ? Critical limb ischemia of left lower extremity/PAD  07/18/2021  ? Atrial fibrillation with RVR ([place]) 07/18/2021  ? Hypoalbuminemia due to protein-calorie malnutrition ([place]) 07/03/2021  ? Anemia 07/03/2021  ? PAD (peripheral artery disease) ([place]) 07/03/2021  ? GERD (gastroesophageal reflux disease) 07/03/2021  ? Mixed hyperlipidemia 07/03/2021  ? Bicipital tenosynovitis 12/04/2020  ? Localized, primary osteoarthritis of hand 12/04/2020  ? Hyperkalemia   ? ESRD (end stage renal disease) ([place]) 09/18/2019  ? Chronic respiratory failure with hypoxia ([place]) 09/17/2019  ? COPD (chronic obstructive pulmonary disease) ([place]) 04/10/2019  ? Nonintractable epilepsy with complex partial seizures ([place]) 08/20/2014  ? Lung nodule, multiple 07/28/2012  ? Arteriovenous fistula ([place]) 01/21/2012  ? At risk for dental problems 01/21/2012  ? Barrett esophagus 01/21/2012  ? FSGS (focal segmental glomerulosclerosis) 01/21/2012  ? Kidney transplanted 01/21/2012  ? ?PCP:  Franciso Bend, MD ?Pharmacy:   ?[id number]?Phone: [phone]: [phone]? ?[facility]?Phone: [phone]: [phone] ? ? ? ? ?Social Determinants of Health (SDOH) Interventions ?  ? ?Readmission Risk Interventions ?Readmission Risk Prevention Plan 07/22/2021 04/10/2019  ?Transportation Screening Complete Complete  ?PCP or Specialist Appt within 3-5 Days Complete Complete  ?[facility] Consult Complete Complete  ?Social Work Consult for [place]/Counseling Complete Complete  ?Palliative Care Screening Not Applicable -  ?Medication Review (Press photographer Complete Complete  ?Some recent data might be hidden  ? ? ? ?

## 2021-10-16 NOTE — Progress Notes (Signed)
MD on-call IMTS notified of increased heart rate 143 & 158 at rest.  12 Lead EKG performed showing A-fib with RVR.  Will continue to monitor at this time with his delicate situation.  Currently on Heparin drip, Cardizem and midodrine PO. ?

## 2021-10-16 NOTE — Transfer of Care (Signed)
Immediate Anesthesia Transfer of Care Note ? ?Patient: Scott Castro ? ?Procedure(s) Performed: LEFT ABOVE KNEE AMPUTATION, REVISION OF RIGHT ABOVE KNEE APMPUTATION (Bilateral: Knee) ? ?Patient Location: PACU ? ?Anesthesia Type:General ? ?Level of Consciousness: drowsy ? ?Airway & Oxygen Therapy: Patient Spontanous Breathing ? ?Post-op Assessment: Report given to RN and Post -op Vital signs reviewed and stable ? ?Post vital signs: Reviewed and stable ? ?Last Vitals:  ?Vitals Value Taken Time  ?BP 187/121 10/16/21 1200  ?Temp    ?Pulse 94 10/16/21 1207  ?Resp 29 10/16/21 1207  ?SpO2 97 % 10/16/21 1207  ?Vitals shown include unvalidated device data. ? ?Last Pain:  ?Vitals:  ? 10/16/21 0912  ?TempSrc: Oral  ?PainSc:   ?   ? ?Patients Stated Pain Goal: 0 (10/15/21 0805) ? ?Complications: No notable events documented. ?

## 2021-10-16 NOTE — Progress Notes (Signed)
Heparin stopped as ordered for surgery.  Consent signed.  Pt remains NPO. ?

## 2021-10-16 NOTE — Anesthesia Preprocedure Evaluation (Addendum)
Anesthesia Evaluation  ?Patient identified by MRN, date of birth, ID band ?Patient awake ? ? ? ?Reviewed: ?Allergy & Precautions, NPO status , Patient's Chart, lab work & pertinent test results ? ?Airway ?Mallampati: II ? ?TM Distance: >3 FB ?Neck ROM: Full ? ? ? Dental ?no notable dental hx. ?(+) Poor Dentition, Dental Advisory Given, Teeth Intact ?  ?Pulmonary ?COPD,  ?  ?Pulmonary exam normal ?breath sounds clear to auscultation ? ? ? ? ? ? Cardiovascular ?+ Peripheral Vascular Disease  ?Normal cardiovascular exam+ dysrhythmias Atrial Fibrillation  ?Rhythm:Regular Rate:Normal ? ?08/31/21 Echo ??1. Abnormal septal motion . Left ventricular ejection fraction, by  ?estimation, is 50 to 55%. The left ventricle has low normal function. The  ?left ventricle has no regional wall motion abnormalities. Left ventricular  ?diastolic parameters are  ?indeterminate.  ??2. Right ventricular systolic function is moderately reduced. The right  ?ventricular size is moderately enlarged. There is moderately elevated  ?pulmonary artery systolic pressure.  ??3. Left atrial size was severely dilated.  ??4. Right atrial size was severely dilated.  ??5. Calcified subchordal apparatus. The mitral valve is abnormal. Mild  ?mitral valve regurgitation. No evidence of mitral stenosis. Moderate  ?mitral annular calcification.  ??6. The tricuspid valve is abnormal. Tricuspid valve regurgitation is  ?severe.  ??7. The aortic valve is tricuspid. There is moderate calcification of the  ?aortic valve. Aortic valve regurgitation is not visualized. Aortic valve  ?sclerosis/calcification is present, without any evidence of aortic  ?stenosis.  ??8. The inferior vena cava is dilated in size with <50% respiratory  ?variability, suggesting right atrial pressure of 15 mmHg.  ?  ?Neuro/Psych ?PSYCHIATRIC DISORDERS Depression   ? GI/Hepatic ?GERD  ,  ?Endo/Other  ? ? Renal/GU ?Dialysis and ESRFRenal diseaseMWF dialysis  ? ?   ?Musculoskeletal ? ?(+) Arthritis ,  ? Abdominal ?  ?Peds ? Hematology ? ?(+) Blood dyscrasia, anemia , Lab Results ?     Component                Value               Date                 ?     WBC                      5.6                 10/16/2021           ?     HGB                      11.4 (L)            10/16/2021           ?     HCT                      36.0 (L)            10/16/2021           ?     MCV                      94.0                10/16/2021           ?     PLT  124 (L)             10/16/2021           ?   ?Anesthesia Other Findings ? ? Reproductive/Obstetrics ? ?  ? ? ? ? ? ? ? ? ? ? ? ? ? ?  ?  ? ? ? ? ? ? ? ?Anesthesia Physical ?Anesthesia Plan ? ?ASA: 3 ? ?Anesthesia Plan: General  ? ?Post-op Pain Management: Ketamine IV* and Tylenol PO (pre-op)*  ? ?Induction: Intravenous ? ?PONV Risk Score and Plan: 3 and Treatment may vary due to age or medical condition, Ondansetron, Midazolam and Dexamethasone ? ?Airway Management Planned: Oral ETT ? ?Additional Equipment: None ? ?Intra-op Plan:  ? ?Post-operative Plan: Extubation in OR ? ?Informed Consent: I have reviewed the patients History and Physical, chart, labs and discussed the procedure including the risks, benefits and alternatives for the proposed anesthesia with the patient or authorized representative who has indicated his/her understanding and acceptance.  ? ?Patient has DNR.  ?Discussed DNR with patient and Suspend DNR. ?  ?Dental advisory given ? ?Plan Discussed with:  ? ?Anesthesia Plan Comments:   ? ? ? ? ? ? ?Anesthesia Quick Evaluation ? ?

## 2021-10-16 NOTE — Anesthesia Procedure Notes (Signed)
Procedure Name: Intubation ?Date/Time: 10/16/2021 10:38 AM ?Performed by: Georgia Duff, CRNA ?Pre-anesthesia Checklist: Patient identified, Emergency Drugs available, Suction available and Patient being monitored ?Patient Re-evaluated:Patient Re-evaluated prior to induction ?Oxygen Delivery Method: Circle System Utilized ?Preoxygenation: Pre-oxygenation with 100% oxygen ?Induction Type: IV induction ?Ventilation: Mask ventilation without difficulty ?Laryngoscope Size: Mac and 3 ?Grade View: Grade I ?Tube type: Oral ?Tube size: 7.5 mm ?Number of attempts: 1 ?Airway Equipment and Method: Stylet and Oral airway ?Placement Confirmation: ETT inserted through vocal cords under direct vision, positive ETCO2 and breath sounds checked- equal and bilateral ?Secured at: 21 cm ?Tube secured with: Tape ?Dental Injury: Teeth and Oropharynx as per pre-operative assessment  ? ? ? ? ?

## 2021-10-17 ENCOUNTER — Encounter (HOSPITAL_COMMUNITY): Payer: Self-pay | Admitting: Vascular Surgery

## 2021-10-17 LAB — HEPATITIS B SURFACE ANTIBODY,QUALITATIVE: Hep B S Ab: REACTIVE — AB

## 2021-10-17 LAB — RENAL FUNCTION PANEL
Albumin: 1.6 g/dL — ABNORMAL LOW (ref 3.5–5.0)
Anion gap: 13 (ref 5–15)
BUN: 27 mg/dL — ABNORMAL HIGH (ref 8–23)
CO2: 24 mmol/L (ref 22–32)
Calcium: 9.5 mg/dL (ref 8.9–10.3)
Chloride: 98 mmol/L (ref 98–111)
Creatinine, Ser: 4.07 mg/dL — ABNORMAL HIGH (ref 0.61–1.24)
GFR, Estimated: 15 mL/min — ABNORMAL LOW (ref >60)
Glucose, Bld: 74 mg/dL (ref 70–99)
Phosphorus: 6.6 mg/dL — ABNORMAL HIGH (ref 2.5–4.6)
Potassium: 4.6 mmol/L (ref 3.5–5.1)
Sodium: 135 mmol/L (ref 135–145)

## 2021-10-17 LAB — CBC
HCT: 22.7 % — ABNORMAL LOW (ref 39.0–52.0)
HCT: 28 % — ABNORMAL LOW (ref 39.0–52.0)
Hemoglobin: 7.1 g/dL — ABNORMAL LOW (ref 13.0–17.0)
Hemoglobin: 9.2 g/dL — ABNORMAL LOW (ref 13.0–17.0)
MCH: 30.9 pg (ref 26.0–34.0)
MCH: 30.4 pg (ref 26.0–34.0)
MCHC: 31.3 g/dL (ref 30.0–36.0)
MCHC: 32.9 g/dL (ref 30.0–36.0)
MCV: 98.7 fL (ref 80.0–100.0)
MCV: 92.4 fL (ref 80.0–100.0)
Platelets: 94 10*3/uL — ABNORMAL LOW (ref 150–400)
Platelets: 88 10*3/uL — ABNORMAL LOW (ref 150–400)
RBC: 2.3 MIL/uL — ABNORMAL LOW (ref 4.22–5.81)
RBC: 3.03 MIL/uL — ABNORMAL LOW (ref 4.22–5.81)
RDW: 19.1 % — ABNORMAL HIGH (ref 11.5–15.5)
RDW: 18.3 % — ABNORMAL HIGH (ref 11.5–15.5)
WBC: 9.7 10*3/uL (ref 4.0–10.5)
WBC: 9.5 10*3/uL (ref 4.0–10.5)
nRBC: 0 % (ref 0.0–0.2)
nRBC: 0 % (ref 0.0–0.2)

## 2021-10-17 LAB — HEPARIN LEVEL (UNFRACTIONATED)
Heparin Unfractionated: 0.23 IU/mL — ABNORMAL LOW (ref 0.30–0.70)
Heparin Unfractionated: 0.14 IU/mL — ABNORMAL LOW (ref 0.30–0.70)
Heparin Unfractionated: 0.21 [IU]/mL — ABNORMAL LOW (ref 0.30–0.70)

## 2021-10-17 LAB — HEPATITIS B SURFACE ANTIGEN: Hepatitis B Surface Ag: NONREACTIVE

## 2021-10-17 LAB — PREPARE RBC (CROSSMATCH)

## 2021-10-17 MED ORDER — MIDODRINE HCL 5 MG PO TABS: 5.0000 mg | ORAL_TABLET | Freq: Once | ORAL | Status: DC | PRN

## 2021-10-17 MED ORDER — SODIUM CHLORIDE 0.9% IV SOLUTION: Freq: Once | INTRAVENOUS | Status: DC

## 2021-10-17 MED ORDER — METHOCARBAMOL 500 MG PO TABS
500.0000 mg | ORAL_TABLET | Freq: Three times a day (TID) | ORAL | Status: DC | PRN
  Administered 2021-10-18: 500 mg via ORAL
  Filled 2021-10-17: qty 1

## 2021-10-17 MED ORDER — HEPARIN (PORCINE) 25000 UT/250ML-% IV SOLN
1650.0000 [IU]/h | INTRAVENOUS | Status: DC
  Administered 2021-10-17: 1350 [IU]/h via INTRAVENOUS

## 2021-10-17 NOTE — Progress Notes (Signed)
ANTICOAGULATION CONSULT NOTE  ?Pharmacy Consult for heparin ?Indication: atrial fibrillation ?Brief A/P: Heparin level subtherapeutic Increase Heparin rate ? ?No Known Allergies ? ?Patient Measurements: ?Height: '5\' 9"'$  (175.3 cm) ?Weight: 68.1 kg (150 lb 2.1 oz) ?IBW/kg (Calculated) : 70.7 ?Heparin Dosing Weight: 65 kg ? ?Vital Signs: ?Temp: 98 ?F (36.7 ?C) (03/11 2300) ?Temp Source: Oral (03/11 2300) ?BP: 82/69 (03/11 2300) ?Pulse Rate: 108 (03/11 2300) ? ?Labs: ?Recent Labs  ?  10/15/21 ?0616 10/15/21 ?1050 10/16/21 ?0124 10/17/21 ?0420 10/17/21 ?1330 10/17/21 ?2255  ?HGB 11.1*  --  11.4* 7.1*  --  9.2*  ?HCT 35.7*  --  36.0* 22.7*  --  28.0*  ?PLT 153  --  124* 94*  --  88*  ?APTT 60*  --   --   --   --   --   ?HEPARINUNFRC 0.31  --   --  0.23* 0.14* 0.21*  ?CREATININE  --  4.66* 3.34* 4.07*  --   --   ? ? ? ?Estimated Creatinine Clearance: 17.2 mL/min (A) (by C-G formula based on SCr of 4.07 mg/dL (H)). ? ? ?Assessment: ?66 y.o. male with h/o Afib, Eliquis on hold, for heparin ? ?Goal of Therapy:  ?Heparin level 0.3-0.7 units/mL ?Monitor platelets by anticoagulation protocol: Yes ?  ?Plan:  ?Increase Heparin 1500 units/hr ?Follow-up am labs. ? ?Phillis Knack, PharmD, BCPS ?  ? ? ?

## 2021-10-17 NOTE — Progress Notes (Signed)
ANTICOAGULATION CONSULT NOTE - Initial Consult ? ?Pharmacy Consult for heparin ?Indication: atrial fibrillation ? ?No Known Allergies ? ?Patient Measurements: ?Height: '5\' 9"'$  (175.3 cm) ?Weight: 66.8 kg (147 lb 4.3 oz) ?IBW/kg (Calculated) : 70.7 ?Heparin Dosing Weight: 65 kg ? ?Vital Signs: ?Temp: 97.8 ?F (36.6 ?C) (03/11 0400) ?Temp Source: Axillary (03/11 0400) ?BP: 88/55 (03/11 0500) ?Pulse Rate: 102 (03/11 0500) ? ?Labs: ?Recent Labs  ?  10/14/21 ?2200 10/15/21 ?0616 10/15/21 ?0616 10/15/21 ?1050 10/16/21 ?0124 10/17/21 ?0420  ?HGB  --  11.1*   < >  --  11.4* 7.1*  ?HCT  --  35.7*  --   --  36.0* 22.7*  ?PLT  --  153  --   --  124* 94*  ?APTT 36 60*  --   --   --   --   ?HEPARINUNFRC  --  0.31  --   --   --  0.23*  ?CREATININE  --   --   --  4.66* 3.34* 4.07*  ? < > = values in this interval not displayed.  ? ? ? ?Estimated Creatinine Clearance: 16.9 mL/min (A) (by C-G formula based on SCr of 4.07 mg/dL (H)). ? ? ?Medical History: ?Past Medical History:  ?Diagnosis Date  ? Atrial fibrillation ([place])   ? Barrett's esophagus   ? Chronic kidney disease   ? CHRONIC\  ? Glomerulosclerosis, focal   ? Hyperparathyroidism due to renal insufficiency ([place])   ? Rheumatic fever   ?  ? ?Assessment: ?66 yo M on apixaban PTA for afib now held for AKA on 3/10. Last dose 3/7 AM. Patient is on dialysis. Pharmacy consulted for heparin.   ? ?Heparin re-ordered heparin to resume 3/10 at 1600. Heparin level this morning subtherapeutic at 0.23. No bolus given recent surgery. Hemoglobin down post-op from 11.4 to 7.1, platelets down from 124 to 94.  ? ?Per RN patient had some old blood near surgical site, however dressing was just changed and no new bleeding noted.  ? ?Goal of Therapy:  ?Heparin level 0.3-0.7 units/ml ?aPTT 66-102 seconds ?Monitor platelets by anticoagulation protocol: Yes ?  ?Plan:  ?Increase heparin to 1250 units/hr  ?Heparin level 1330 ?Monitor daily heparin level, CBC ?Monitor for signs/symptoms of bleeding  ?F/u  restart apixaban post -op ? ?Cathrine Muster, PharmD ?PGY2 Cardiology Pharmacy Resident ?Phone: [phone] ?10/17/2021  7:18 AM ? ?Please check [email or web address] for unit-specific pharmacy phone numbers. ? ? ?

## 2021-10-17 NOTE — Progress Notes (Signed)
[facility] ?Progress Note ? ?Subjective: seen in room, awaiting HD today ? ?Vitals:  ? 10/17/21 1231 10/17/21 1248 10/17/21 1302 10/17/21 1330  ?BP: (!) 124/102 (!) 85/39 (!) 102/50 (!) 71/46  ?Pulse: (!) 101 (!) 101 99 99  ?Resp: '13 12 12 12  '$ ?Temp: 97.7 ?F (36.5 ?C) 97.6 ?F (36.4 ?C) 97.6 ?F (36.4 ?C)   ?TempSrc:   Axillary   ?SpO2:      ?Weight:      ?Height:      ? ? ?Exam: ?Gen alert, frail, nasal cann O2, no distress ?No jvd or bruits ?Chest clear bilat to bases, barrell chest ?RRR no RG ?Abd soft ntnd ?Ext bilat AKA, no edema ?Neuro is alert, Ox 3 , nf ?  LUA AVF+bruit  ?  ?  ?  ?  ? Home meds include - eliquis 5 bid, lipitor, neurontin 100 bid, midodrine 10 tid, zoloft,  ?  ?  CXR - 3/7- chronic changes, mild CM, no gross edema , small bilat effusions ?     ? OP HD: MWF [place] (was [employer])    ?    4h  400/600  60.3kg  2/2.5 bath  LUA AVG  Hep none ? - hect 3 ug tiw IV (dc'd here) ? - sodium thiosulfate 25 gm tiw IV  ? - home: renvela packet 2.4gm ac tid ?  ?  ?  ?Assessment/ Plan: ?Wound infection of BKA stump - IV abx per pmd. SP new L AKA and revision of R AKA today 3/10 by VVS.  ?Hypotension - on midodrine 10 tid, BP's still low today.   ?Volume - wt's up but no vol ^ on exam. Unable to tolerate UF d/t low BP's ?Afib RVR - per pmd ?ESRD - on HD MWF. HD today off schedule. Resume MWF on Monday.  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 Phos in range, cont binder.  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 DC'd hectorol.  ?DNR ?  ?  ?  ? ? ? ? ?Rob Doctor, hospital ?10/17/2021, 1:49 PM ? ? ?Recent Labs  ?Lab 10/15/21 ?1050 10/16/21 ?0124 10/17/21 ?0420  ?K 3.9 3.9 4.6  ?BUN 32* 21 27*  ?CREATININE 4.66* 3.34* 4.07*  ?ALBUMIN 1.8*  --  1.6*  ?CALCIUM 9.9 9.7 9.5  ?PHOS 5.3*  --  6.6*  ?HGB  --  11.4* 7.1*  ? ? ?Inpatient medications: ? sodium chloride   Intravenous Once  ? atorvastatin  10 mg  Oral QHS  ? Chlorhexidine Gluconate Cloth  6 each Topical Q0600  ? gabapentin  100 mg Oral BID  ? Gerhardt's butt cream   Topical Daily  ? midodrine  10 mg Oral TID WC  ? sertraline  100 mg Oral Daily  ? ? ceFEPime (MAXIPIME) IV 1 g (10/16/21 1402)  ? diltiazem (CARDIZEM) infusion 5 mg/hr (10/16/21 1208)  ? heparin 1,250 Units/hr (10/17/21 0843)  ? sodium thiosulfate infusion for calciphylaxis Stopped (10/17/21 1329)  ? vancomycin Stopped (10/17/21 1318)  ? ?acetaminophen **OR** acetaminophen, heparin, HYDROmorphone (DILAUDID) injection, methocarbamol, ondansetron **OR** ondansetron (ZOFRAN) IV, oxyCODONE, senna-docusate, simethicone ? ? ? ? ? ? ?

## 2021-10-17 NOTE — Progress Notes (Signed)
Interval Progress Note: ? ?Paged by RN regarding hypotension and bilateral upper extremity third-spacing. Patient evaluated at bedside. Patient is comfortable in bed with visitor at bedside. He denies any lightheadedness/dizziness, headache, chest pain, shortness of breath or weakness. He does note having difficulty with his blood pressures "for a while".  ? ?Physical Exam  ?Blood pressure 91/67, pulse (!) 110, temperature 98 ?F (36.7 ?C), temperature source Oral, resp. rate 20, height '5\' 9"'$  (1.753 m), weight 68.7 kg, SpO2 95 %. ?Constitutional: Chronically ill appearing male, appears older than stated age, no acute distress  ?Cardiovascular: Sinus tachycardia ?Respiratory: Effort normal, no respiratory distress on room air  ?Musculoskeletal: Normal bulk and tone.  Bilateral upper extremities edematous  ?Neurological: Is alert and oriented x4, no apparent focal deficits noted. ?Skin: Warm and dry.  Diffuse ecchymosis on bilateral upper extremities  ?  ?Blood pressures have been soft throughout admission. At bedside, patient noted to have BP of 88/50 with MAP 61. He is mentating appropriately. He appears volume overload on exam; seems like fluid removal has been an issue in dialysis due to his ongoing hypotension. Also, bilateral upper extremities are significantly edematous. He has a fistula of LUE and has midline on the RUE. BP cuff is placed at the forearm; suspect that the blood pressure readings may not be accurate. At this time, patient is asymptomatic. No emergent need for arterial line. Will continue to monitor.  ?Will add midodrine '5mg'$  once prn for MAP <60 and discuss with day team regarding increased midodrine dosing for patient. ? ? ?Harvie Heck, MD ?Internal Medicine, PGY-3 ?10/17/21 10:20 PM ?Pager # [phone] ? ? ?

## 2021-10-17 NOTE — Plan of Care (Signed)
  Problem: Clinical Measurements: Goal: Ability to maintain clinical measurements within normal limits will improve Outcome: Progressing Goal: Will remain free from infection Outcome: Progressing Goal: Respiratory complications will improve Outcome: Progressing Goal: Cardiovascular complication will be avoided Outcome: Progressing   

## 2021-10-17 NOTE — Progress Notes (Addendum)
? ?HD#4 ?SUBJECTIVE:  ?Patient Summary: Scott Castro is a 66 year old male with a past medical history of paroxysmal atrial fibrillation on eliquis, ESRD on HD MWF (19 years) s/p failed kidney transplant, Barrett's esophagus, and critical limb ischemia leading to bilateral BKA and R AKA, who is POD 1 from revision of L BKA to AKA and revision of R AKA to higher R AKA. ? ?Overnight Events: Hypotensive with low MAPs; day nurse updated team that when night nurse changed dressing yesterday there was a large pool of blood and she rebandaged the area. There hasn't been rebleeding since and at that time surgical site looked okay. Diltiazem drip was held 2/2 hypotension. ? ?Interim History: Patient complains of spasming to the LLE that is particularly bothersome.  He says that he threw up after trying to eat because his throat is dry and he had difficulty with swallowing.  When he tries to drink fluids he has to cough several times as well.  This is new this morning as he slept through mealtimes yesterday.  He also states that his scrotal swelling is not new but is tender to palpation.  He says that he thinks it is because his legs are so close together all the time.  He again voices concerns about having gas but says that medication we have been giving him for gas has been helping but that in the past it does cause him rectal pain. ? ?OBJECTIVE:  ?Vital Signs: ?Vitals:  ? 10/17/21 0200 10/17/21 0400 10/17/21 0500 10/17/21 0725  ?BP: (!) 84/60 99/64 (!) 88/55 (!) 68/56  ?Pulse: 98 (!) 102 (!) 102   ?Resp: '14 16 14 '$ (!) 21  ?Temp:  97.8 ?F (36.6 ?C)  97.8 ?F (36.6 ?C)  ?TempSrc:  Axillary  Oral  ?SpO2: 93% 94% 90% 92%  ?Weight:   66.8 kg   ?Height:      ? ?Supplemental O2: Room Air ?SpO2: 92 % ? ?Filed Weights  ? 10/16/21 0507 10/16/21 0912 10/17/21 0500  ?Weight: 68.6 kg 68.6 kg 66.8 kg  ? ? ? ?Intake/Output Summary (Last 24 hours) at 10/17/2021 0734 ?Last data filed at 10/16/2021 1300 ?Gross per 24 hour  ?Intake 1070 ml   ?Output --  ?Net 1070 ml  ? ?Net IO Since Admission: 5,703.37 mL [10/17/21 0734] ? ?Physical Exam: ?Constitutional: Chronically ill-appearing gentleman in bed.  No acute distress. ?Cardio: Regular rate and rhythm. ?Pulm: Mild bibasilar crackles.  Normal work of breathing on room air. ?Abdomen: Soft, nontender, nondistended. ?GU: Scrotum is edematous with mild tenderness to palpation without obvious sores and no increased erythema or warmth to the area. ?MSK: Compressive bandages over bilateral amputation sites. ?Skin: Warm and dry. ?Neuro: Alert and oriented x3.  No focal deficit noted. ?Psych: Normal mood and affect. ? ?Patient Lines/Drains/Airways Status   ? ? Active Line/Drains/Airways   ? ? Name Placement date Placement time Site Days  ? Peripheral IV 10/16/21 22 G Right Hand 10/16/21  1021  Hand  1  ? Fistula / Graft Left Upper arm --  --  Upper arm  --  ? Midline Single Lumen 77/93/90 Right Basilic 30/09/23  --  Basilic  4  ? Negative Pressure Wound Therapy Thigh Anterior;Right 09/09/21  1454  --  38  ? Incision (Closed) 09/02/21 Leg Left 09/02/21  1114  -- 45  ? Incision (Closed) 09/09/21 Thigh Right 09/09/21  1442  -- 38  ? Incision (Closed) 10/16/21 Thigh Anterior;Distal;Right 10/16/21  1900  -- 1  ?  Incision (Closed) 10/16/21 Thigh Anterior;Distal;Left 10/16/21  1900  -- 1  ? Wound / Incision (Open or Dehisced) 08/26/21  Buttocks Right Abrasion 08/26/21  0900  Buttocks  52  ? Wound / Incision (Open or Dehisced) 08/26/21 Skin tear Other (Comment) Anterior;Right Stump 08/26/21  --  Other (Comment)  52  ? Wound / Incision (Open or Dehisced) 10/14/21 (MASD) Moisture Associated Skin Damage Perineum 10/14/21  0059  Perineum  3  ? ?  ?  ? ?  ? ? ? ?ASSESSMENT/PLAN:  ?Assessment: ?Principal Problem: ?  Nonhealing surgical wound ? ? ?Plan: ?#L AKA 2/2 non-healing wound on L BKA ?#Revision of R AKA ?#Hypotension ?POD 1 from revision of L BKA to AKA and revision of R AKA to higher R AKA. Today is day 4 of cefepime,  and he has received two days of vancomycin with HD sessions. Blood cultures show NGTD, no surgical cultures collected that I can see. Afebrile without leukocytosis, however overnight he also experienced hypotension with low MAPs. Morning labs reveal significant drop of hemoglobin from 11.4 to 7.1. Reported EBL during his procedure was 200cc however in speaking with day nurse on rounds our team was made aware that during a dressing change yesterday a large pool of blood was noted around the patient's left surgical site.  Recent dressing change of surgical sites reports non-bleeding wounds.  ?-VVS following, appreciate their assistance ? -Operative findings of significant subcutaneous edema bilaterally with viable-appearing tissue at completion ? -Further failure of wound healing would prompt palliative discussions rather than continued attempts at amputation ?-2 units PRBC ordered ? -F/u post-transfusion H&H ?-Cefepime 1 g IV daily, day 4 ?-Vancomycin 500 mg IV TTSa with HD ?-Trend CBC and fever curve ?-Midodrine '10mg'$  TID ?-Continue heparin drip with dosing per pharmacy ?-Restart Eliquis Monday 03/13 ?  ?#Paroxysmal Atrial Fibrillation w/ RVR ?Due to overnight hypotension diltiazem drip was discontinued around 2200; most recent rate appears to have been 5 mg/h.  On physical exam patient was in sinus rhythm with normal heart rate.  Heparin restarted post-op with most recent level showing subtherapeutic range. ?-Heparin per pharmacy ?-Consider consulting cardiology if recurrence of RVR if BP is still too low to restart home diltiazem. Would avoid amiodarone given pulmonology findings. ?  ?#ESRD on HD MWF with anuria ?#Secondary hyperparathyroidism ?Last HD session 03/09 with next session planned for sometime today. Serum creatinine on morning labs of 4.07 (3.34 03/10), GFR 15. Corrected calcium elevated at11.0. ?-Nephrology consulted, appreciate their assistance with HD needs ? -Missed HD 03/10 2/2  operation ? -Off-schedule HD planned for today ?-Trend renal function panel ?- Avoid nephrotoxic medications ? ?#Scrotal edema ?Unclear of the onset of scrotal edema as at certain points in our conversation patient said that he was aware of it and at others he said he has not.  Scrotum is tender to palpation and enlarged but soft without increased redness.  Of note, he is net +6 L.  Given the lack of general pain and I am less concerned at this time about an emergent cause of the edema like testicular torsion and feel that this is more likely 2/2 volume overload and dependent positioning. ?-CTM.  Consider ultrasound if edema continues to worsen or patient has new/worsening pain. ? ?Best Practice: ?Diet: Renal diet with 1200 mL restriction ?IVF: None ?VTE: Place and maintain sequential compression device Start: 10/14/21 0957 ?Code: DNR ?AB: Cefepime 1 g IV daily, vancomycin 500 mg IV daily TTSa with HD ?DISPO: Anticipated discharge in 2-3  days to Skilled nursing facility pending Medical stability and Wound care. ? ?Signature: ?Farrel Gordon, D.O.  ?Internal Medicine Resident, PGY-1 ?[facility]  ?Pager: [phone] ?7:34 AM, 10/17/2021  ? ?Please contact the on call pager after 5 pm and on weekends at [phone]. ? ?

## 2021-10-17 NOTE — Progress Notes (Signed)
Bp 68/43 (52), taken multiple times.  Pt with significant third spacing to bilateral arms and scrotum.  No bleeding noted.  Heparin @ 13.5cc/hr.  Pt alert and oriented but states he is very sleepy.  Asked if he felt better after transfusion today, pt replied, not really, just been sleeping all day.  IMTS paged and notified of findings.  Team to come to bedside. ?

## 2021-10-17 NOTE — Progress Notes (Signed)
ANTICOAGULATION CONSULT NOTE - Initial Consult ? ?Pharmacy Consult for heparin ?Indication: atrial fibrillation ? ?No Known Allergies ? ?Patient Measurements: ?Height: '5\' 9"'$  (175.3 cm) ?Weight: 69.3 kg (152 lb 12.5 oz) ?IBW/kg (Calculated) : 70.7 ?Heparin Dosing Weight: 65 kg ? ?Vital Signs: ?Temp: 97.6 ?F (36.4 ?C) (03/11 1302) ?Temp Source: Axillary (03/11 1302) ?BP: 71/46 (03/11 1330) ?Pulse Rate: 99 (03/11 1330) ? ?Labs: ?Recent Labs  ?  10/14/21 ?2200 10/15/21 ?0616 10/15/21 ?0616 10/15/21 ?1050 10/16/21 ?0124 10/17/21 ?0420 10/17/21 ?1330  ?HGB  --  11.1*   < >  --  11.4* 7.1*  --   ?HCT  --  35.7*  --   --  36.0* 22.7*  --   ?PLT  --  153  --   --  124* 94*  --   ?APTT 36 60*  --   --   --   --   --   ?HEPARINUNFRC  --  0.31  --   --   --  0.23* 0.14*  ?CREATININE  --   --   --  4.66* 3.34* 4.07*  --   ? < > = values in this interval not displayed.  ? ? ? ?Estimated Creatinine Clearance: 17.5 mL/min (A) (by C-G formula based on SCr of 4.07 mg/dL (H)). ? ? ?Medical History: ?Past Medical History:  ?Diagnosis Date  ? Atrial fibrillation ([place])   ? Barrett's esophagus   ? Chronic kidney disease   ? CHRONIC\  ? Glomerulosclerosis, focal   ? Hyperparathyroidism due to renal insufficiency ([place])   ? Rheumatic fever   ?  ? ?Assessment: ?66 yo M on apixaban PTA for afib now held for AKA on 3/10. Last dose 3/7 AM. Patient is on dialysis. Pharmacy consulted for heparin.   ? ?Heparin re-ordered heparin to resume 3/10 at 1600. Heparin level subtherapeutic at 0.14. No bolus given recent surgery. Hemoglobin down post-op from 11.4 to 7.1, platelets down from 124 to 94.  ? ?No bleeding noted per RN.  ? ?Goal of Therapy:  ?Heparin level 0.3-0.7 units/ml ?aPTT 66-102 seconds ?Monitor platelets by anticoagulation protocol: Yes ?  ?Plan:  ?Increase heparin to 1350 units/hr  ?Heparin level 2100 ?Monitor daily heparin level, CBC ?Monitor for signs/symptoms of bleeding  ?F/u restart apixaban post -op ? ?Cathrine Muster, PharmD ?PGY2  Cardiology Pharmacy Resident ?Phone: [phone] ?10/17/2021  2:57 PM ? ?Please check [email or web address] for unit-specific pharmacy phone numbers. ? ? ?

## 2021-10-17 NOTE — Progress Notes (Signed)
Bp 90/46 (56), SR rate 90's.  Cardizem gtt turned off, paged Dr. Ezequiel Kayser.  Discussed pt vs and status.  Noted in assessment that scrotum very edematous.  Elevated on pillow.  Made doctor aware.  Will continue to monitor patient. ?

## 2021-10-17 NOTE — Progress Notes (Signed)
?  Progress Note ? ? ? ?10/17/2021 ?1:25 PM ?1 Day Post-Op ? ?Subjective:  pain controlled ? ?Vitals:  ? 10/17/21 1248 10/17/21 1302  ?BP: (!) 85/39 (!) 102/50  ?Pulse: (!) 101 99  ?Resp: 12 12  ?Temp: 97.6 ?F (36.4 ?C) 97.6 ?F (36.4 ?C)  ?SpO2:    ? ? ?Physical Exam: ?Aaox3 ?R aka bandage cdi ?L AKA bandage has been reinforced without evidence of bleeding ? ?CBC ?   ?Component Value Date/Time  ? WBC 9.7 10/17/2021 0420  ? RBC 2.30 (L) 10/17/2021 0420  ? HGB 7.1 (L) 10/17/2021 0420  ? HGB 11.4 (L) 06/11/2012 0501  ? HCT 22.7 (L) 10/17/2021 0420  ? HCT 34.6 (L) 06/11/2012 0501  ? PLT 94 (L) 10/17/2021 0420  ? PLT 251 06/11/2012 0501  ? MCV 98.7 10/17/2021 0420  ? MCV 95 06/11/2012 0501  ? MCH 30.9 10/17/2021 0420  ? MCHC 31.3 10/17/2021 0420  ? RDW 19.1 (H) 10/17/2021 0420  ? RDW 14.4 06/11/2012 0501  ? LYMPHSABS 1.2 10/13/2021 1310  ? LYMPHSABS 1.2 06/11/2012 0501  ? MONOABS 0.6 10/13/2021 1310  ? MONOABS 0.7 06/11/2012 0501  ? EOSABS 0.0 10/13/2021 1310  ? EOSABS 0.2 06/11/2012 0501  ? BASOSABS 0.0 10/13/2021 1310  ? BASOSABS 0.1 06/11/2012 0501  ? ? ?BMET ?   ?Component Value Date/Time  ? NA 135 10/17/2021 0420  ? NA 142 06/10/2012 0924  ? K 4.6 10/17/2021 0420  ? K 4.5 06/16/2013 1315  ? CL 98 10/17/2021 0420  ? CL 102 06/10/2012 0924  ? CO2 24 10/17/2021 0420  ? CO2 29 06/10/2012 0924  ? GLUCOSE 74 10/17/2021 0420  ? GLUCOSE 83 06/10/2012 0924  ? BUN 27 (H) 10/17/2021 0420  ? BUN 35 (H) 06/10/2012 2297  ? CREATININE 4.07 (H) 10/17/2021 0420  ? CREATININE 10.29 (H) 06/10/2012 9892  ? CALCIUM 9.5 10/17/2021 0420  ? CALCIUM 9.2 06/10/2012 0924  ? GFRNONAA 15 (L) 10/17/2021 0420  ? GFRNONAA 5 (L) 06/10/2012 1194  ? GFRAA 9 (L) 09/18/2019 0430  ? GFRAA 6 (L) 06/10/2012 1740  ? ? ?INR ?   ?Component Value Date/Time  ? INR 1.6 (H) 10/13/2021 1310  ? ? ? ?Intake/Output Summary (Last 24 hours) at 10/17/2021 1325 ?Last data filed at 10/17/2021 1301 ?Gross per 24 hour  ?Intake 840 ml  ?Output --  ?Net 840 ml   ? ? ? ?Assessment:  66 y.o. male is revision of right above-knee amputation to higher above-knee amputation and conversion of left below-knee to above-knee amputation.  He has had evidence of bleeding from the left side on heparin drip ? ? ?Plan: ?-Continue heparin as long as he does not have ongoing evidence of blood loss on hold transition to oral anticoagulation at this time ?-Dressings down tomorrow for further evaluation ? ?Damontay Alred C. Donzetta Matters, MD ?[facility] ?Office: [phone] ?Pager: [phone] ? ?10/17/2021 ?1:25 PM ? ?

## 2021-10-17 NOTE — Progress Notes (Signed)
?   10/17/21 1900  ?Assess: MEWS Score  ?Temp 97.8 ?F (36.6 ?C)  ?BP (!) 68/43  ?Pulse Rate (!) 104  ?ECG Heart Rate (!) 105  ?Resp 16  ?Level of Consciousness Alert  ?SpO2 96 %  ?O2 Device Room Air  ?Assess: MEWS Score  ?MEWS Temp 0  ?MEWS Systolic 3  ?MEWS Pulse 1  ?MEWS RR 0  ?MEWS LOC 0  ?MEWS Score 4  ?MEWS Score Color Red  ?Assess: if the MEWS score is Yellow or Red  ?Were vital signs taken at a resting state? Yes  ?Focused Assessment No change from prior assessment  ?Early Detection of Sepsis Score *See Row Information* Low  ?MEWS guidelines implemented *See Row Information* Yes  ?Treat  ?MEWS Interventions Escalated (See documentation below)  ?Pain Scale 0-10  ?Pain Score 0  ?Take Vital Signs  ?Increase Vital Sign Frequency  Red: Q 1hr X 4 then Q 4hr X 4, if remains red, continue Q 4hrs  ?Escalate  ?MEWS: Escalate Red: discuss with charge nurse/RN and provider, consider discussing with RRT  ?Notify: Charge Nurse/RN  ?Name of Charge Nurse/RN Notified April Cooper  ?Date Charge Nurse/RN Notified 10/17/21  ?Time Charge Nurse/RN Notified 2011  ?Notify: Provider  ?Provider Name/Title Dr. Philipp Ovens  ?Date Provider Notified 10/17/21  ?Time Provider Notified 2000  ?Notification Type Call  ?Notification Reason Change in status  ?Provider response En route  ?Date of Provider Response 10/17/21  ?Time of Provider Response 2000  ?Notify: Rapid Response  ?Name of Rapid Response RN Notified [place]  ?Date Rapid Response Notified 10/17/21  ?Time Rapid Response Notified 2014  ?Document  ?Patient Outcome Other (Comment) ?(waiting for MD)  ?Progress note created (see row info) Yes  ? ? ?

## 2021-10-17 NOTE — Progress Notes (Signed)
200 ml positive fluid balance unable to remove any due to hypotension as low as 50 and 60 systolic.  gave 2 units of blood,  vancomycin, sodium thiosulfate, dilaudid and midodrine for bp support.  pre bp 98/74 post bp 71/46 pre weight 69.3kg post weight 68.1kg.  2 bandages to lua avf no bleeding dressing cdi. ?

## 2021-10-17 NOTE — Plan of Care (Signed)
?  Problem: Education: ?Goal: Knowledge of General Education information will improve ?Description: Including pain rating scale, medication(s)/side effects and non-pharmacologic comfort measures ?Outcome: Progressing ?  ?Problem: Health Behavior/Discharge Planning: ?Goal: Ability to manage health-related needs will improve ?Outcome: Progressing ?  ?Problem: Clinical Measurements: ?Goal: Will remain free from infection ?Outcome: Progressing ?Goal: Respiratory complications will improve ?Outcome: Progressing ?Goal: Cardiovascular complication will be avoided ?Outcome: Progressing ?  ?Problem: Activity: ?Goal: Risk for activity intolerance will decrease ?Outcome: Not Progressing ?  ?Problem: Nutrition: ?Goal: Adequate nutrition will be maintained ?Outcome: Not Progressing ?  ?Problem: Coping: ?Goal: Level of anxiety will decrease ?Outcome: Progressing ?  ?Problem: Elimination: ?Goal: Will not experience complications related to bowel motility ?Outcome: Progressing ?Goal: Will not experience complications related to urinary retention ?Outcome: Progressing ?  ?Problem: Pain Managment: ?Goal: General experience of comfort will improve ?Outcome: Progressing ?  ?Problem: Safety: ?Goal: Ability to remain free from injury will improve ?Outcome: Progressing ?  ?

## 2021-10-18 DIAGNOSIS — T8189XA Other complications of procedures, not elsewhere classified, initial encounter: Secondary | ICD-10-CM | POA: Diagnosis not present

## 2021-10-18 DIAGNOSIS — Z992 Dependence on renal dialysis: Secondary | ICD-10-CM | POA: Diagnosis not present

## 2021-10-18 DIAGNOSIS — N186 End stage renal disease: Secondary | ICD-10-CM | POA: Diagnosis not present

## 2021-10-18 DIAGNOSIS — I4891 Unspecified atrial fibrillation: Secondary | ICD-10-CM | POA: Diagnosis not present

## 2021-10-18 LAB — CBC
HCT: 27.4 % — ABNORMAL LOW (ref 39.0–52.0)
HCT: 24.7 % — ABNORMAL LOW (ref 39.0–52.0)
Hemoglobin: 9.2 g/dL — ABNORMAL LOW (ref 13.0–17.0)
Hemoglobin: 8.2 g/dL — ABNORMAL LOW (ref 13.0–17.0)
MCH: 31.1 pg (ref 26.0–34.0)
MCH: 31.2 pg (ref 26.0–34.0)
MCHC: 33.6 g/dL (ref 30.0–36.0)
MCHC: 33.2 g/dL (ref 30.0–36.0)
MCV: 92.6 fL (ref 80.0–100.0)
MCV: 93.9 fL (ref 80.0–100.0)
Platelets: 82 10*3/uL — ABNORMAL LOW (ref 150–400)
Platelets: 92 10*3/uL — ABNORMAL LOW (ref 150–400)
RBC: 2.96 MIL/uL — ABNORMAL LOW (ref 4.22–5.81)
RBC: 2.63 MIL/uL — ABNORMAL LOW (ref 4.22–5.81)
RDW: 18.6 % — ABNORMAL HIGH (ref 11.5–15.5)
RDW: 18.2 % — ABNORMAL HIGH (ref 11.5–15.5)
WBC: 9.8 10*3/uL (ref 4.0–10.5)
WBC: 9.5 10*3/uL (ref 4.0–10.5)
nRBC: 0 % (ref 0.0–0.2)
nRBC: 0 % (ref 0.0–0.2)

## 2021-10-18 LAB — RENAL FUNCTION PANEL
Albumin: <1.5 g/dL — ABNORMAL LOW (ref 3.5–5.0)
Anion gap: 12 (ref 5–15)
BUN: 18 mg/dL (ref 8–23)
CO2: 25 mmol/L (ref 22–32)
Calcium: 9.2 mg/dL (ref 8.9–10.3)
Chloride: 98 mmol/L (ref 98–111)
Creatinine, Ser: 2.73 mg/dL — ABNORMAL HIGH (ref 0.61–1.24)
GFR, Estimated: 25 mL/min — ABNORMAL LOW (ref >60)
Glucose, Bld: 68 mg/dL — ABNORMAL LOW (ref 70–99)
Phosphorus: 5.3 mg/dL — ABNORMAL HIGH (ref 2.5–4.6)
Potassium: 4.2 mmol/L (ref 3.5–5.1)
Sodium: 135 mmol/L (ref 135–145)

## 2021-10-18 LAB — CULTURE, BLOOD (ROUTINE X 2)
Culture: NO GROWTH
Culture: NO GROWTH

## 2021-10-18 LAB — LACTIC ACID, PLASMA
Lactic Acid, Venous: 1.7 mmol/L (ref 0.5–1.9)
Lactic Acid, Venous: 1.1 mmol/L (ref 0.5–1.9)
Lactic Acid, Venous: 0.9 mmol/L (ref 0.5–1.9)

## 2021-10-18 LAB — MAGNESIUM: Magnesium: 1.7 mg/dL (ref 1.7–2.4)

## 2021-10-18 LAB — HEPARIN LEVEL (UNFRACTIONATED)
Heparin Unfractionated: 0.22 IU/mL — ABNORMAL LOW (ref 0.30–0.70)
Heparin Unfractionated: 0.27 IU/mL — ABNORMAL LOW (ref 0.30–0.70)

## 2021-10-18 MED ORDER — SODIUM CHLORIDE 0.9 % IV BOLUS: 500.0000 mL | Freq: Once | INTRAVENOUS | Status: DC

## 2021-10-18 MED ORDER — ALBUMIN HUMAN 25 % IV SOLN
25.0000 g | Freq: Once | INTRAVENOUS | Status: AC
  Administered 2021-10-18: 25 g via INTRAVENOUS
  Filled 2021-10-18: qty 100

## 2021-10-18 MED ORDER — SODIUM CHLORIDE 0.9% IV SOLUTION: Freq: Once | INTRAVENOUS | Status: DC

## 2021-10-18 MED ORDER — MIDODRINE HCL 5 MG PO TABS
15.0000 mg | ORAL_TABLET | Freq: Four times a day (QID) | ORAL | Status: DC
  Administered 2021-10-18 – 2021-10-23 (×18): 15 mg via ORAL
  Filled 2021-10-18 (×19): qty 3

## 2021-10-18 MED ORDER — MAGNESIUM SULFATE 2 GM/50ML IV SOLN
2.0000 g | Freq: Once | INTRAVENOUS | Status: AC
  Administered 2021-10-18: 2 g via INTRAVENOUS
  Filled 2021-10-18: qty 50

## 2021-10-18 MED ORDER — MIDODRINE HCL 5 MG PO TABS
15.0000 mg | ORAL_TABLET | Freq: Three times a day (TID) | ORAL | Status: DC
  Filled 2021-10-18: qty 3

## 2021-10-18 NOTE — Progress Notes (Addendum)
Interval Progress Note: 10/18/21  ? ?Scott Castro 66 yo M with PMHx of afib, ESRD on HD, PVD , who was admitted for L BKA amputation site infection  treated with abx and now s/p new L AKA and R AKA revision with VS 10/16/21. ? ?IMTS received  a page at 2000 for in regard to Scott Castro hypotension and concern for hypervolemia in the setting of receiving scheduled blood products. Scott Castro was evaluated at bedside by the IMTS night team. Patient states that he is doing well, but feels tired, which he states has been ongoing since his admission. He states that he is currently not feeling hypotensive. His symptoms when he has a true hypotensive episode consists of feelings of dizziness or light headedness. He currently denies any dizziness/light headedness, headaches, chest pain, or shortness of breath.  ? ?On exam, patient is ill-appearing, cachectic, NAD, lethargic, awakes easily to verbal stim, oriented to self and situation.  Tachycardic, regular rhythm, fistula bruit appreciated, no additional murmurs.  Crackles appreciated bilateral lung bases L >R.  Normal work of breathing on room air.  2+ pitting edema bilateral upper extremities.  Bruising right forearm. ? ?On review of the chart, fluid removal during HD complicated by hypotension with patient now on midodrine.  Patient is signs of volume overload with severe swelling of bilateral upper extremities, now with bibasilar crackles. Patient has received multiple doses of albumin as well as small fluid boluses throughout the day and has remained on midodrine for blood pressure support. Patient has BP cuff on right wrist due to LUE HD fistula, midline on RUE and recent bilateral lower extremity vascular surgery. Multiple providers agree BP readings with this cuff placement are likely inaccurate BP measurements, and likely blood pressure is higher than presented on the monitor given patient's mental status.  Primary team today recommended consult to PCCM for A-line  placement if maps continue to be low.  Lactic acid from 4 PM today normal.  Patient has oozing wounds postop with hemoglobin downtrending, most recent 8.2 this afternoon down from 9.2 this morning.  1 uRBC ordered and not yet given by RN staff.  PCCM was consulted earlier today for ongoing hypotension, PCCM saw patient and recommended holding on A-line placement given little benefit to placing an A-line as he is DNR and would not be placed on vasopressors.  Per PCCM there are limited options for volume management given he is not tolerating HD.  PCCM recommended more aggressive goals of care discussions and palliative care team consult.  Also recommended increasing midodrine to 50 mg 4 times daily and monitoring BP and correlation with mental status.  Goal for systolic BP above 85 if possible, consider holding IV Dilaudid.  PCCM does not recommend transfer to ICU at that time. ? ?On this evening's reassessment of patient's chronic hypotension, patient continues to have maps in the 29s though patient remains awake, lethargic, denies symptoms of hypotension suggesting inaccurate BP readings as previously noted.  We will give uRBCs to help with hypotension and may help with fluid retention. We appreciate the above recommendations from PCCM and have placed the following orders. ?Plan: ?-Okay to give uRBCs, follow-up H&H ?-Hold fluids ?-Repeat lactic acid ?-Increase Midodrine 15 mg 4 times daily first dose this evening ?-Continue to correlate BP with mental status during vitals assessment ?-Goal systolic BP above 85 if possible, anticipate likely inaccurate low diastolic BP reading ?-Encouraged day team to continue with aggressive goals of care discussions and consider palliative care  team consult ?-Try to hold IV Dilaudid if possible ? ?Scott Denis, MD ?10/18/21,  10:59 PM ?Pager: [phone] ?Internal Medicine Resident, PGY-1 ?[facility]  ? ?ADDENDUM: Repeat lactic acid this evening normal 0.9, 1  unit RBCs transfusion begun. ? ?

## 2021-10-18 NOTE — Progress Notes (Signed)
?  Progress Note ? ? ? ?10/18/2021 ?9:06 AM ?2 Days Post-Op ? ?Subjective: Says the food at the nursing home is better than here ? ?Vitals:  ? 10/18/21 0504 10/18/21 0739  ?BP: 91/67 (!) 64/40  ?Pulse: (!) 110 (!) 111  ?Resp: 20 (!) 21  ?Temp: 98 ?F (36.7 ?C) 98 ?F (36.7 ?C)  ?SpO2: 95% 93%  ? ? ?Physical Exam: ?Awake alert and oriented ?Bilateral above-knee amputation site healing well with staples in place, dressings removed today ? ?CBC ?   ?Component Value Date/Time  ? WBC 9.8 10/18/2021 0440  ? RBC 2.96 (L) 10/18/2021 0440  ? HGB 9.2 (L) 10/18/2021 0440  ? HGB 11.4 (L) 06/11/2012 0501  ? HCT 27.4 (L) 10/18/2021 0440  ? HCT 34.6 (L) 06/11/2012 0501  ? PLT 82 (L) 10/18/2021 0440  ? PLT 251 06/11/2012 0501  ? MCV 92.6 10/18/2021 0440  ? MCV 95 06/11/2012 0501  ? MCH 31.1 10/18/2021 0440  ? MCHC 33.6 10/18/2021 0440  ? RDW 18.6 (H) 10/18/2021 0440  ? RDW 14.4 06/11/2012 0501  ? LYMPHSABS 1.2 10/13/2021 1310  ? LYMPHSABS 1.2 06/11/2012 0501  ? MONOABS 0.6 10/13/2021 1310  ? MONOABS 0.7 06/11/2012 0501  ? EOSABS 0.0 10/13/2021 1310  ? EOSABS 0.2 06/11/2012 0501  ? BASOSABS 0.0 10/13/2021 1310  ? BASOSABS 0.1 06/11/2012 0501  ? ? ?BMET ?   ?Component Value Date/Time  ? NA 135 10/18/2021 0440  ? NA 142 06/10/2012 0924  ? K 4.2 10/18/2021 0440  ? K 4.5 06/16/2013 1315  ? CL 98 10/18/2021 0440  ? CL 102 06/10/2012 0924  ? CO2 25 10/18/2021 0440  ? CO2 29 06/10/2012 0924  ? GLUCOSE 68 (L) 10/18/2021 0440  ? GLUCOSE 83 06/10/2012 0924  ? BUN 18 10/18/2021 0440  ? BUN 35 (H) 06/10/2012 8921  ? CREATININE 2.73 (H) 10/18/2021 0440  ? CREATININE 10.29 (H) 06/10/2012 1941  ? CALCIUM 9.2 10/18/2021 0440  ? CALCIUM 9.2 06/10/2012 0924  ? GFRNONAA 25 (L) 10/18/2021 0440  ? GFRNONAA 5 (L) 06/10/2012 7408  ? GFRAA 9 (L) 09/18/2019 0430  ? GFRAA 6 (L) 06/10/2012 1448  ? ? ?INR ?   ?Component Value Date/Time  ? INR 1.6 (H) 10/13/2021 1310  ? ? ? ?Intake/Output Summary (Last 24 hours) at 10/18/2021 0906 ?Last data filed at 10/17/2021  1539 ?Gross per 24 hour  ?Intake 1398.22 ml  ?Output -186 ml  ?Net 1584.22 ml  ? ? ? ?Assessment/play:  66 y.o. male is status post bilateral above-knee amputations.  Continue heparin given oozing on the left above-knee amputation site.  He should be okay to restart oral anticoagulation prior to discharge. ? ? ?Zoiee Wimmer C. Donzetta Matters, MD ?[facility] ?Office: [phone] ?Pager: [phone] ? ?10/18/2021 ?9:06 AM ? ?

## 2021-10-18 NOTE — Plan of Care (Signed)
  Problem: Clinical Measurements: Goal: Respiratory complications will improve Outcome: Progressing Goal: Cardiovascular complication will be avoided Outcome: Progressing   

## 2021-10-18 NOTE — Progress Notes (Signed)
?   10/18/21 1926  ?Assess: MEWS Score  ?Temp 97.8 ?F (36.6 ?C)  ?BP (!) 82/25  ?Pulse Rate (!) 106  ?ECG Heart Rate (!) 106  ?Resp 13  ?Level of Consciousness Responds to Voice  ?SpO2 97 %  ?O2 Device Room Air  ?Assess: MEWS Score  ?MEWS Temp 0  ?MEWS Systolic 1  ?MEWS Pulse 1  ?MEWS RR 1  ?MEWS LOC 1  ?MEWS Score 4  ?MEWS Score Color Red  ?Assess: if the MEWS score is Yellow or Red  ?Were vital signs taken at a resting state? Yes  ?Focused Assessment No change from prior assessment  ?Early Detection of Sepsis Score *See Row Information* Low  ?MEWS guidelines implemented *See Row Information* No, previously red, continue vital signs every 4 hours  ?Treat  ?MEWS Interventions Other (Comment) ?(called MD to update)  ?Pain Scale 0-10  ?Pain Score Asleep  ?Notify: Charge Nurse/RN  ?Name of Charge Nurse/RN Notified April Cooper  ?Date Charge Nurse/RN Notified 10/18/21  ?Time Charge Nurse/RN Notified 1930  ?Notify: Provider  ?Provider Name/Title Dr. Vinetta Bergamo  ?Date Provider Notified 10/18/21  ?Time Provider Notified 1940  ?Notification Type Call  ?Notification Reason Other (Comment) ?(update)  ?Provider response En route  ?Date of Provider Response 10/18/21  ?Time of Provider Response 2000  ?Notify: Rapid Response  ?Name of Rapid Response RN Notified Jarrett Soho  ?Date Rapid Response Notified 10/18/21  ?Time Rapid Response Notified 2000  ?Document  ?Patient Outcome Stabilized after interventions  ?Progress note created (see row info) Yes  ? ? ?

## 2021-10-18 NOTE — Progress Notes (Signed)
? LOS: 5 days  ?Patient Summary: Scott Castro is a 66 year old male with a past medical history of paroxysmal atrial fibrillation on eliquis, ESRD on HD MWF (19 years) s/p failed kidney transplant, Barrett's esophagus, and critical limb ischemia leading to bilateral BKA and R AKA, who presented with a non-healing wounds leading to revision of L BKA to AKA and R AKA to higher R AKA. ? ?Overnight Events: Hypotensive overnight. RN paged night team who saw patient at bedside. Patient was asymptomatic and denied lightheadedness, dizziness, headache, chest pain, shortness of breath, and weakness. No change in mental status. No bleeding noted at surgical sites.  ? ?Interim History: Patient seen and evaluated by team at bedside. He endorses pain in his legs. He denies lightheadedness, headache, dyspnea, chest pain. He reports improvement in his rectal pain, but notes that he has some leakage of fecal matter.  ? ?Objective: ?Vital signs in last 24 hours: ?Vitals:  ? 10/18/21 0504 10/18/21 0739 10/18/21 0800 10/18/21 1125  ?BP: 91/67 (!) 64/40 (!) 93/58 (!) 80/50  ?Pulse: (!) 110 (!) 111 (!) 108 (!) 111  ?Resp: 20 (!) 21 (!) 21 14  ?Temp: 98 ?F (36.7 ?C) 98 ?F (36.7 ?C)  98 ?F (36.7 ?C)  ?TempSrc: Oral Oral  Oral  ?SpO2: 95% 93% 97% 91%  ?Weight: 68.7 kg     ?Height:      ? ?SpO2: 91 % ?Weight change: 0.7 kg ? ?Intake/Output Summary (Last 24 hours) at 10/18/2021 1257 ?Last data filed at 10/18/2021 0900 ?Gross per 24 hour  ?Intake 1321.55 ml  ?Output -186 ml  ?Net 1507.55 ml  ? ?Physical Exam ?Constitutional: Malnourished male laying in bed, no acute distress. ?Cardiovascular: Sinus tachycardia, sound from AV fistula heard. ?Respiratory: Normal effort, no respiratory distress on room air. ?Abdominal: Soft, non-tender, non-distended. ?GU: Scrotal edema with tenderness to palpation. Non-erythematous.  ?Musculoskeletal: Bilateral upper extremity edema. Surgical staples intact on bilateral amputation sites with no active bleeding.  Bilateral legs tender to palpation. ?Neurological: Alert and oriented x4, no apparent focal deficits noted on exam. ?Skin: Warm and dry. Ecchymoses noted on RUE. ? ?Lab Results: ?CBC Latest Ref Rng & Units 10/18/2021 10/17/2021 10/17/2021  ?WBC 4.0 - 10.5 K/uL 9.8 9.5 9.7  ?Hemoglobin 13.0 - 17.0 g/dL 9.2(L) 9.2(L) 7.1(L)  ?Hematocrit 39.0 - 52.0 % 27.4(L) 28.0(L) 22.7(L)  ?Platelets 150 - 400 K/uL 82(L) 88(L) 94(L)  ? ?BMP Latest Ref Rng & Units 10/18/2021 10/17/2021 10/16/2021  ?Glucose 70 - 99 mg/dL 68(L) 74 84  ?BUN 8 - 23 mg/dL 18 27(H) 21  ?Creatinine 0.61 - 1.24 mg/dL 2.73(H) 4.07(H) 3.34(H)  ?Sodium 135 - 145 mmol/L 135 135 137  ?Potassium 3.5 - 5.1 mmol/L 4.2 4.6 3.9  ?Chloride 98 - 111 mmol/L 98 98 99  ?CO2 22 - 32 mmol/L '25 24 24  '$ ?Calcium 8.9 - 10.3 mg/dL 9.2 9.5 9.7  ? ?Magnesium 1.7 ?Heparin level 0.22 --> 0.27 ? ?Studies/Results: ?No results found. ?Medications: ?Scheduled Meds: ? sodium chloride   Intravenous Once  ? atorvastatin  10 mg Oral QHS  ? Chlorhexidine Gluconate Cloth  6 each Topical Q0600  ? gabapentin  100 mg Oral BID  ? Gerhardt's butt cream   Topical Daily  ? midodrine  10 mg Oral TID WC  ? sertraline  100 mg Oral Daily  ? ?Continuous Infusions: ? albumin human    ? ceFEPime (MAXIPIME) IV 1 g (10/17/21 1449)  ? diltiazem (CARDIZEM) infusion 5 mg/hr (10/16/21 1208)  ? magnesium  sulfate bolus IVPB    ? sodium thiosulfate infusion for calciphylaxis Stopped (10/17/21 1329)  ? vancomycin Stopped (10/17/21 1318)  ? ?PRN Meds:.acetaminophen **OR** acetaminophen, HYDROmorphone (DILAUDID) injection, methocarbamol, ondansetron **OR** ondansetron (ZOFRAN) IV, oxyCODONE, senna-docusate, simethicone ?Assessment/Plan: ?Principal Problem: ?  Nonhealing surgical wound ? ?#L AKA 2/2 non-healing wound on L BKA ?#Revision of R AKA ?#Hypotension ?POD 2 from revision of L BKA to AKA and revision of R AKA to higher R AKA. Today is day 5 of cefepime, and he has received two doses of vancomycin with HD sessions and  one dose in the ED. Blood cultures show NGTD, and there were no surgical cultures collected. He remains afebrile without leukocytosis. Hgb increased to 9.2 from 7.1 yesterday after receiving two units of blood. Unable to tolerate UF at HD yesterday due to hypotension. Seen by ortho and bilateral above-knee amputation sites healing well with staples in place. Dressings removed today. Patient has edema in bilateral arms and scrotum on exam. ?-VVS following, appreciate their assistance ?            -Operative findings of significant subcutaneous edema bilaterally with viable-appearing tissue at completion ?            -Further failure of wound healing would prompt palliative discussions rather than continued attempts at amputation ?- Cefepime 1 g IV daily, day 5 ?- Vancomycin 500 mg IV with HD ?- Trend CBC and fever curve ?- Midodrine '10mg'$  TID ?- albumin 25% 25g IV ?- lactic acid ?  ?#Paroxysmal Atrial Fibrillation w/ RVR ?Due to hypotension, diltiazem drip continues to be held. On physical exam, patient was in sinus tachycardia. Heparin level this morning was subtherapeutic range at 0.27. He was started on heparin drip post-op to bridge to Eliquis, but will stop heparin given continued oozing at left amputation site. Will restart Eliquis before discharge. ?- Discontinue Heparin ?- Avoid amiodarone given pulmonology findings. ?- Mg 2g IV ?- Keep Mg >2 ?- Keep K >4 ?  ?#ESRD on HD MWF with anuria ?#Secondary hyperparathyroidism ?Last HD session 03/11 with plan to resume MWF schedule tomorrow. Serum creatinine on morning labs of 2.73, GFR 25. Albumin <1.5.  ?- Nephrology consulted, appreciate their assistance with HD ?- Trend renal function panel ?- Avoid nephrotoxic medications ?  ?#Scrotal edema ?Scrotal edema unchanged from yesterday. Scrotum is tender to palpation and enlarged but soft without redness. Edema is likely due to volume overload and dependent positioning. ?- CTM   ?- Consider ultrasound if edema continues  to worsen or patient has new/worsening pain. ? ?Prior to Admission Living Arrangement: SNF ?Anticipated Discharge Location: SNF ?Barriers to Discharge: medical stability ?Dispo: Anticipated discharge in approximately 2 day(s).  ? ?This is a Careers information officer Note.  The care of the patient was discussed with Dr. Philipp Ovens and the assessment and plan formulated with their assistance.  Please see their attached note for official documentation of the daily encounter. ? ?Nelva Nay, Medical Student ?10/18/2021, 12:57 PM ?Pager: [phone] ?After 5pm on weekdays and 1pm on weekends: On Call pager [phone] ?  ?

## 2021-10-18 NOTE — Progress Notes (Addendum)
[facility] ?Progress Note ? ?Subjective: seen in room, no new c/o ? ?Vitals:  ? 10/18/21 0504 10/18/21 0739 10/18/21 0800 10/18/21 1125  ?BP: 91/67 (!) 64/40 (!) 93/58 (!) 80/50  ?Pulse: (!) 110 (!) 111 (!) 108 (!) 111  ?Resp: 20 (!) 21 (!) 21 14  ?Temp: 98 ?F (36.7 ?C) 98 ?F (36.7 ?C)  98 ?F (36.7 ?C)  ?TempSrc: Oral Oral  Oral  ?SpO2: 95% 93% 97% 91%  ?Weight: 68.7 kg     ?Height:      ? ? ?Exam: ?Gen alert, frail, nasal cann O2, no distress ?No jvd or bruits ?Chest clear bilat to bases, barrell chest ?RRR no RG ?Abd soft ntnd ?Ext bilat AKA, diffuse 2+ UE and LE stump edema ?Neuro is alert, Ox 3 , nf ?  LUA AVF+bruit  ?  ?  ?  ?  ? Home meds include - eliquis 5 bid, lipitor, neurontin 100 bid, midodrine 10 tid, zoloft,  ?  ?  CXR - 3/7- chronic changes, mild CM, no gross edema , small bilat effusions ?     ? OP HD: MWF [place] (was [employer] [facility])    ?    4h  400/600  60.3kg  2/2.5 bath  LUA AVG  Hep none ? - hect 3 ug tiw IV (dc'd here) ? - sodium thiosulfate 25 gm tiw IV  ? - home: renvela packet 2.4gm ac tid ?  ?  ?  ?Assessment/ Plan: ?Wound infection of BKA stump - IV abx per pmd. SP new L AKA and revision of R AKA today 3/10 by VVS.  ?Hypotension - on midodrine 10 tid, will ^ to 15 tid. BP's soft. Overall this is not good w/ BP's which are not coming up. Pt is DNR. Pt is not a candidate for CRRT, pressor support etc.  Will need to get palliative care involved most likely.  ?Volume - wt's cont to climb ?Afib RVR - per pmd ?ESRD - on HD MWF. HD today off schedule. Resume MWF on Monday.  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 Phos in range, cont binder.  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 DC'd hectorol.  ?DNR ?  ?  ?  ? ? ? ? ?Rob Doctor, hospital ?10/18/2021, 3:08 PM ? ? ?Recent Labs  ?Lab 10/17/21 ?0420 10/17/21 ?2255 10/18/21 ?0440 10/18/21 ?1300  ?K 4.6  --  4.2  --   ?BUN 27*  --  18  --    ?CREATININE 4.07*  --  2.73*  --   ?ALBUMIN 1.6*  --  <1.5*  --   ?CALCIUM 9.5  --  9.2  --   ?PHOS 6.6*  --  5.3*  --   ?HGB 7.1*   < > 9.2* 8.2*  ? < > = values in this interval not displayed.  ? ? ?Inpatient medications: ? sodium chloride   Intravenous Once  ? atorvastatin  10 mg Oral QHS  ? Chlorhexidine Gluconate Cloth  6 each Topical Q0600  ? gabapentin  100 mg Oral BID  ? Gerhardt's butt cream   Topical Daily  ? midodrine  10 mg Oral TID WC  ? sertraline  100 mg Oral Daily  ? ? ceFEPime (MAXIPIME) IV 1 g (10/17/21 1449)  ? diltiazem (CARDIZEM) infusion 5 mg/hr (10/16/21 1208)  ? sodium thiosulfate infusion for  calciphylaxis Stopped (10/17/21 1329)  ? vancomycin Stopped (10/17/21 1318)  ? ?acetaminophen **OR** acetaminophen, HYDROmorphone (DILAUDID) injection, methocarbamol, ondansetron **OR** ondansetron (ZOFRAN) IV, oxyCODONE, senna-docusate, simethicone ? ? ? ? ? ? ?

## 2021-10-18 NOTE — Progress Notes (Signed)
ANTICOAGULATION CONSULT NOTE - Initial Consult ? ?Pharmacy Consult for heparin ?Indication: atrial fibrillation ? ?No Known Allergies ? ?Patient Measurements: ?Height: '5\' 9"'$  (175.3 cm) ?Weight: 68.7 kg (151 lb 7.3 oz) ?IBW/kg (Calculated) : 70.7 ?Heparin Dosing Weight: 65 kg ? ?Vital Signs: ?Temp: 98 ?F (36.7 ?C) (03/12 0739) ?Temp Source: Oral (03/12 0739) ?BP: 93/58 (03/12 0800) ?Pulse Rate: 108 (03/12 0800) ? ?Labs: ?Recent Labs  ?  10/16/21 ?0124 10/17/21 ?0420 10/17/21 ?1330 10/17/21 ?2255 10/18/21 ?0440 10/18/21 ?1015  ?HGB 11.4* 7.1*  --  9.2* 9.2*  --   ?HCT 36.0* 22.7*  --  28.0* 27.4*  --   ?PLT 124* 94*  --  88* 82*  --   ?HEPARINUNFRC  --  0.23*   < > 0.21* 0.22* 0.27*  ?CREATININE 3.34* 4.07*  --   --  2.73*  --   ? < > = values in this interval not displayed.  ? ? ? ?Estimated Creatinine Clearance: 25.9 mL/min (A) (by C-G formula based on SCr of 2.73 mg/dL (H)). ? ? ?Medical History: ?Past Medical History:  ?Diagnosis Date  ? Atrial fibrillation ([place])   ? Barrett's esophagus   ? Chronic kidney disease   ? CHRONIC\  ? Glomerulosclerosis, focal   ? Hyperparathyroidism due to renal insufficiency ([place])   ? Rheumatic fever   ?  ? ?Assessment: ?66 yo M on apixaban PTA for afib now held for AKA on 3/10. Last dose 3/7 AM. Patient is on dialysis. Pharmacy consulted for heparin.   ? ?Heparin re-ordered heparin to resume 3/10 at 1600. Heparin level subtherapeutic at 0.27 this morning on 1500 units/hr. No bolus given recent surgery.  ? ?Hemoglobin down post-op from 11.4 to 7.1, but is now starting to improve and up to 9.2 today, platelets down from 124 to 84. Will watch closely. Having oozing from amputation site per vascular so continue with heparin today.  ? ?Goal of Therapy:  ?Heparin level 0.3-0.7 units/ml ?aPTT 66-102 seconds ?Monitor platelets by anticoagulation protocol: Yes ?  ?Plan:  ?Increase heparin to 1650 units/hr  ?Heparin level 1700 ?Monitor daily heparin level, CBC ?Monitor for signs/symptoms of  bleeding  ?F/u restart apixaban post -op ? ?Cathrine Muster, PharmD ?PGY2 Cardiology Pharmacy Resident ?Phone: [phone] ?10/18/2021  10:52 AM ? ?Please check [email or web address] for unit-specific pharmacy phone numbers. ? ? ?

## 2021-10-18 NOTE — Consult Note (Addendum)
NAME:  Scott Castro, MRN:  [id number], DOB:  11-25-1955, LOS: 5 ADMISSION DATE:  10/13/2021, CONSULTATION DATE:  10/18/2021 REFERRING MD: Family practice teaching service, CHIEF COMPLAINT: Hypotension  History of Present Illness:  This is a 66 year old gentleman, past medical history of atrial fibrillation, end-stage renal disease on dialysis.  Patient has peripheral vascular disease status post infection of BKA stump on IV antibiotics had a new left AKA and revision of right AKA by vascular surgery on 10/16/2021.  His ESRD timeframe has been complicated by chronic hypotension on midodrine, severe volume overload third spacing dependent edema as well as calciphylaxis.  Pulmonary critical care was consulted for recommendations regarding ongoing hypotension in a patient with blood pressure currently being checked with oscillometry in the right forearm.  Unable to check blood pressures in the right brachial due to PICC line placement and unable to check blood pressures in the left brachial due to AV fistula and unable to check blood pressures and lower extremities status post amputation.  Patient has received multiple doses of albumin as well as small fluid boluses throughout the day and has remained on midodrine.  Pertinent  Medical History   Past Medical History:  Diagnosis Date   Atrial fibrillation ([place])    Barrett's esophagus    Chronic kidney disease    CHRONIC\   Glomerulosclerosis, focal    Hyperparathyroidism due to renal insufficiency (HCC)    Rheumatic fever      Significant Hospital Events: Including procedures, antibiotic start and stop dates in addition to other pertinent events     Interim History / Subjective:  Patient once aroused will follow commands and able to communicate.  Neurologically remains intact.  Objective   Blood pressure (!) 92/43, pulse (!) 109, temperature 98 F (36.7 C), temperature source Oral, resp. rate 10, height '5\' 9"'$  (1.753 m), weight 68.7 kg, SpO2 93  %.        Intake/Output Summary (Last 24 hours) at 10/18/2021 1742 Last data filed at 10/18/2021 1159 Gross per 24 hour  Intake 480 ml  Output --  Net 480 ml   Filed Weights   10/17/21 1041 10/17/21 1419 10/18/21 0504  Weight: 69.3 kg 68.1 kg 68.7 kg    Examination: General: Chronically ill-appearing, somewhat disheveled, low muscle mass, muscle wasting in the temporalis and anterior chest HENT: Loss of supraclavicular fat pad, visible venous distention Lungs: Clear to auscultation bilaterally, diminished breath sounds Cardiovascular: Regular rate rhythm S1-S2 Abdomen: Soft nontender nondistended Extremities: Bilateral lower extremity amputation, large areas of dependent edema weeping oozing from wounds, dependent edema in the both upper extremities forearms Neuro: Alert oriented follows commands GU: Very large edematous scrotum  Resolved Hospital Problem list     Assessment & Plan:   Chronic hypotension, not really sure if his oscillometry reading from a blood pressure cuff on his right forearm is accurate - Unable to check blood pressures really anywhere else, inserting arterial line would not benefit this patient as he is currently a DNR and will not be adding vasopressors. Bilateral AKA End-stage renal disease on dialysis Severe anasarca, third spacing Plan: Postop care per vascular surgery I called and discussed case with nephrology. Unfortunately there is really nothing else we can offer from a volume management standpoint. He is intolerant of full courses of dialysis due to hypotension. He needs more aggressive goals of care discussions. I called and discussed this with the patient's primary team. I would consider consulting palliative care team. In the meantime you can  increase his midodrine to 15 mg 4 times daily I would monitor his blood pressure in correlation with his mental status.  As long as he is able to mentate I would not necessarily worry about his low  diastolic blood pressure reading.  Goal should be to maintain systolic blood pressures above 85 if possible. Would hold on any transfer to the intensive care unit at this time. Would consider holding IV dilaudid which may be bringing his BP down   We appreciate consultation.   Labs   CBC: Recent Labs  Lab 10/13/21 1310 10/14/21 0633 10/16/21 0124 10/17/21 0420 10/17/21 2255 10/18/21 0440 10/18/21 1300  WBC 5.9   < > 5.6 9.7 9.5 9.8 9.5  NEUTROABS 4.0  --   --   --   --   --   --   HGB 12.2*   < > 11.4* 7.1* 9.2* 9.2* 8.2*  HCT 38.1*   < > 36.0* 22.7* 28.0* 27.4* 24.7*  MCV 96.2   < > 94.0 98.7 92.4 92.6 93.9  PLT 157   < > 124* 94* 88* 82* 92*   < > = values in this interval not displayed.    Basic Metabolic Panel: Recent Labs  Lab 10/13/21 2119 10/14/21 0633 10/15/21 1050 10/16/21 0124 10/17/21 0420 10/18/21 0440  NA  --  137 138 137 135 135  K  --  3.6 3.9 3.9 4.6 4.2  CL  --  97* 98 99 98 98  CO2  --  '22 24 24 24 25  '$ GLUCOSE  --  83 128* 84 74 68*  BUN  --  26* 32* 21 27* 18  CREATININE  --  3.81* 4.66* 3.34* 4.07* 2.73*  CALCIUM  --  10.3 9.9 9.7 9.5 9.2  MG 2.0  --   --   --   --  1.7  PHOS 5.0* 5.1* 5.3*  --  6.6* 5.3*   GFR: Estimated Creatinine Clearance: 25.9 mL/min (A) (by C-G formula based on SCr of 2.73 mg/dL (H)). Recent Labs  Lab 10/13/21 1310 10/13/21 2114 10/14/21 0633 10/17/21 0420 10/17/21 2255 10/18/21 0440 10/18/21 1300 10/18/21 1615  WBC 5.9  --    < > 9.7 9.5 9.8 9.5  --   LATICACIDVEN 1.7 1.3  --   --   --   --  1.7 1.1   < > = values in this interval not displayed.    Liver Function Tests: Recent Labs  Lab 10/13/21 1310 10/14/21 0633 10/15/21 1050 10/17/21 0420 10/18/21 0440  AST 31  --   --   --   --   ALT 26  --   --   --   --   ALKPHOS 94  --   --   --   --   BILITOT 1.0  --   --   --   --   PROT 6.0*  --   --   --   --   ALBUMIN 2.2* 2.0* 1.8* 1.6* <1.5*   No results for input(s): LIPASE, AMYLASE in the last  168 hours. No results for input(s): AMMONIA in the last 168 hours.  ABG    Component Value Date/Time   PHART 7.474 (H) 08/20/2021 1217   PCO2ART 35.8 08/20/2021 1217   PO2ART 61.3 (L) 08/20/2021 1217   HCO3 26.0 08/20/2021 1217   O2SAT 92.0 08/20/2021 1217     Coagulation Profile: Recent Labs  Lab 10/13/21 1310  INR 1.6*    Cardiac  Enzymes: No results for input(s): CKTOTAL, CKMB, CKMBINDEX, TROPONINI in the last 168 hours.  HbA1C: No results found for: HGBA1C  CBG: No results for input(s): GLUCAP in the last 168 hours.  Review of Systems:   Review of Systems  Constitutional:  Positive for weight loss. Negative for chills, fever and malaise/fatigue.  HENT:  Negative for hearing loss, sore throat and tinnitus.   Eyes:  Negative for blurred vision and double vision.  Respiratory:  Negative for cough, hemoptysis, sputum production, shortness of breath, wheezing and stridor.   Cardiovascular:  Negative for chest pain, palpitations, orthopnea, leg swelling and PND.  Gastrointestinal:  Negative for abdominal pain, constipation, diarrhea, heartburn, nausea and vomiting.  Genitourinary:  Negative for dysuria, hematuria and urgency.  Musculoskeletal:  Positive for joint pain and myalgias.  Skin:  Negative for itching and rash.  Neurological:  Positive for weakness. Negative for dizziness, tingling and headaches.  Endo/Heme/Allergies:  Negative for environmental allergies. Does not bruise/bleed easily.  Psychiatric/Behavioral:  Negative for depression. The patient is not nervous/anxious and does not have insomnia.   All other systems reviewed and are negative.   Past Medical History:  He,  has a past medical history of Atrial fibrillation ([place]), Barrett's esophagus, Chronic kidney disease, Glomerulosclerosis, focal, Hyperparathyroidism due to renal insufficiency ([place]), and Rheumatic fever.   Surgical History:   Past Surgical History:  Procedure Laterality Date   ABDOMINAL  AORTOGRAM W/LOWER EXTREMITY N/A 08/28/2021   Procedure: ABDOMINAL AORTOGRAM W/LOWER EXTREMITY;  Surgeon: Cherre Robins, MD;  Location: [facility];  Service: Cardiovascular;  Laterality: N/A;   AMPUTATION Right 08/07/2021   Procedure: AMPUTATION BELOW KNEE;  Surgeon: Algernon Huxley, MD;  Location: [facility];  Service: Vascular;  Laterality: Right;   AMPUTATION Left 09/02/2021   Procedure: LEFT BELOW KNEE AMPUTATION;  Surgeon: Serafina Mitchell, MD;  Location: [place];  Service: Vascular;  Laterality: Left;   AMPUTATION Right 09/09/2021   Procedure: RIGHT AMPUTATION ABOVE KNEE;  Surgeon: Cherre Robins, MD;  Location: [place];  Service: Vascular;  Laterality: Right;   AMPUTATION Bilateral 10/16/2021   Procedure: LEFT ABOVE KNEE AMPUTATION, REVISION OF RIGHT ABOVE KNEE APMPUTATION;  Surgeon: Waynetta Sandy, MD;  Location: [place];  Service: Vascular;  Laterality: Bilateral;   AV FISTULA PLACEMENT     COLONOSCOPY N/A 03/22/2016   Procedure: COLONOSCOPY;  Surgeon: Manya Silvas, MD;  Location: [facility];  Service: Endoscopy;  Laterality: N/A;   DG AV DIALYSIS GRAFT DECLOT OR     ESOPHAGOGASTRODUODENOSCOPY (EGD) WITH PROPOFOL  03/22/2016   Procedure: ESOPHAGOGASTRODUODENOSCOPY (EGD) WITH PROPOFOL;  Surgeon: Manya Silvas, MD;  Location: [facility];  Service: Endoscopy;;   FLEXIBLE BRONCHOSCOPY     KIDNEY TRANSPLANT Right 1985   LOWER EXTREMITY ANGIOGRAPHY Right 07/20/2021   Procedure: Lower Extremity Angiography;  Surgeon: Algernon Huxley, MD;  Location: [place] [facility];  Service: Cardiovascular;  Laterality: Right;   LOWER EXTREMITY ANGIOGRAPHY Right 08/06/2021   Procedure: Lower Extremity Angiography;  Surgeon: Algernon Huxley, MD;  Location: [place] [facility];  Service: Cardiovascular;  Laterality: Right;   REMOVAL TENCKHOFF CATH       Social History:   reports that he has never smoked. He has never used smokeless tobacco. He reports that he does not currently use  alcohol. He reports that he does not use drugs.   Family History:  His family history includes Heart disease in his father and mother.   Allergies No Known  Allergies   Home Medications  Prior to Admission medications   Medication Sig Start Date End Date Taking? Authorizing Provider  acetaminophen (TYLENOL) 325 MG tablet Take 2 tablets (650 mg total) by mouth every 6 (six) hours as needed for mild pain or moderate pain. 09/16/21  Yes Hongalgi, Lenis Dickinson, MD  albuterol (VENTOLIN HFA) 108 (90 Base) MCG/ACT inhaler Inhale 2 puffs into the lungs every 6 (six) hours as needed for wheezing or shortness of breath. 07/08/21  Yes Enzo Bi, MD  apixaban (ELIQUIS) 5 MG TABS tablet Take 1 tablet (5 mg total) by mouth 2 (two) times daily. 07/22/21  Yes Sharen Hones, MD  atorvastatin (LIPITOR) 10 MG tablet Take 1 tablet (10 mg total) by mouth at bedtime. 07/22/21  Yes Sharen Hones, MD  bisacodyl (DULCOLAX) 10 MG suppository Place 1 suppository (10 mg total) rectally daily as needed for moderate constipation. 09/16/21  Yes Hongalgi, Lenis Dickinson, MD  diltiazem (CARDIZEM CD) 120 MG 24 hr capsule Take 1 capsule (120 mg total) by mouth daily. Hold for sbp < 100 08/12/21  Yes Kc, Maren Beach, MD  gabapentin (NEURONTIN) 100 MG capsule Take 1 capsule (100 mg total) by mouth 2 (two) times daily. 08/12/21  Yes Antonieta Pert, MD  hydrocortisone (ANUSOL-HC) 25 MG suppository Place 25 mg rectally 2 (two) times daily.   Yes [provider]  meropenem (MERREM) IVPB Inject 500 mg into the vein See admin instructions. Qd x 7 days   Yes [provider]  metoprolol tartrate (LOPRESSOR) 25 MG tablet Take 1.5 tablets (37.5 mg total) by mouth 2 (two) times daily. 09/16/21  Yes Hongalgi, Lenis Dickinson, MD  midodrine (PROAMATINE) 10 MG tablet Take 1 tablet (10 mg total) by mouth 3 (three) times daily with meals. 09/16/21  Yes Hongalgi, Lenis Dickinson, MD  multivitamin (RENA-VIT) TABS tablet Take 1 tablet by mouth at bedtime. 09/16/21  Yes Hongalgi, Lenis Dickinson, MD  Nutritional Supplements (FEEDING SUPPLEMENT, NEPRO CARB STEADY,) LIQD Take 237 mLs by mouth 3 (three) times daily between meals. 09/16/21  Yes Hongalgi, Lenis Dickinson, MD  oxyCODONE (OXY IR/ROXICODONE) 5 MG immediate release tablet Take 1 tablet (5 mg total) by mouth every 6 (six) hours as needed for severe pain. 09/16/21  Yes Hongalgi, Lenis Dickinson, MD  pantoprazole (PROTONIX) 40 MG tablet Take 40 mg by mouth daily. 03/27/19  Yes [provider]  polyethylene glycol (MIRALAX / GLYCOLAX) 17 g packet Take 17 g by mouth daily. 09/16/21  Yes Hongalgi, Lenis Dickinson, MD  senna-docusate (SENOKOT-S) 8.6-50 MG tablet Take 2 tablets by mouth 2 (two) times daily. 08/12/21  Yes Antonieta Pert, MD  sertraline (ZOLOFT) 100 MG tablet Take 1 tablet (100 mg total) by mouth daily. 09/17/21  Yes Hongalgi, Lenis Dickinson, MD  sevelamer carbonate (RENVELA) 800 MG tablet Take 2400 mg by mouth three times daily with meals, 800 mg 3 times daily as needed with snacks Patient taking differently: Take 1,600 mg by mouth 3 (three) times daily with meals. 09/16/21  Yes Hongalgi, Lenis Dickinson, MD  witch hazel-glycerin (TUCKS) pad Apply topically as needed for itching. Patient not taking: Reported on 10/14/2021 09/16/21   Modena Jansky, MD     Garner Nash, DO [facility] 10/18/2021 5:42 PM

## 2021-10-19 ENCOUNTER — Other Ambulatory Visit (HOSPITAL_COMMUNITY): Payer: Medicare Other

## 2021-10-19 DIAGNOSIS — R1310 Dysphagia, unspecified: Secondary | ICD-10-CM

## 2021-10-19 DIAGNOSIS — Z515 Encounter for palliative care: Secondary | ICD-10-CM

## 2021-10-19 DIAGNOSIS — T8189XA Other complications of procedures, not elsewhere classified, initial encounter: Secondary | ICD-10-CM | POA: Diagnosis not present

## 2021-10-19 DIAGNOSIS — N186 End stage renal disease: Secondary | ICD-10-CM | POA: Diagnosis not present

## 2021-10-19 LAB — RENAL FUNCTION PANEL
Albumin: 2.5 g/dL — ABNORMAL LOW (ref 3.5–5.0)
Anion gap: 9 (ref 5–15)
BUN: 7 mg/dL — ABNORMAL LOW (ref 8–23)
CO2: 28 mmol/L (ref 22–32)
Calcium: 8.1 mg/dL — ABNORMAL LOW (ref 8.9–10.3)
Chloride: 99 mmol/L (ref 98–111)
Creatinine, Ser: 1.26 mg/dL — ABNORMAL HIGH (ref 0.61–1.24)
GFR, Estimated: >60 mL/min (ref >60)
Glucose, Bld: 82 mg/dL (ref 70–99)
Phosphorus: 2.4 mg/dL — ABNORMAL LOW (ref 2.5–4.6)
Potassium: 3 mmol/L — ABNORMAL LOW (ref 3.5–5.1)
Sodium: 136 mmol/L (ref 135–145)

## 2021-10-19 LAB — HEPATITIS B SURFACE ANTIBODY, QUANTITATIVE: Hep B S AB Quant (Post): 34.2 m[IU]/mL (ref >9.9)

## 2021-10-19 LAB — BPAM RBC
Blood Product Expiration Date: 202304072359
Blood Product Expiration Date: 202304092359
Blood Product Expiration Date: 202304092359
ISSUE DATE / TIME: 202303111050
ISSUE DATE / TIME: 202303111050
ISSUE DATE / TIME: 202303122104
Unit Type and Rh: 5100
Unit Type and Rh: 5100
Unit Type and Rh: 5100

## 2021-10-19 LAB — TYPE AND SCREEN
ABO/RH(D): O POS
Antibody Screen: NEGATIVE
Unit division: 0
Unit division: 0
Unit division: 0

## 2021-10-19 LAB — CBC
HCT: 24.1 % — ABNORMAL LOW (ref 39.0–52.0)
Hemoglobin: 8 g/dL — ABNORMAL LOW (ref 13.0–17.0)
MCH: 30.8 pg (ref 26.0–34.0)
MCHC: 33.2 g/dL (ref 30.0–36.0)
MCV: 92.7 fL (ref 80.0–100.0)
Platelets: 71 10*3/uL — ABNORMAL LOW (ref 150–400)
RBC: 2.6 MIL/uL — ABNORMAL LOW (ref 4.22–5.81)
RDW: 17.2 % — ABNORMAL HIGH (ref 11.5–15.5)
WBC: 5.5 10*3/uL (ref 4.0–10.5)
nRBC: 0 % (ref 0.0–0.2)

## 2021-10-19 LAB — PREPARE RBC (CROSSMATCH)

## 2021-10-19 MED ORDER — RENA-VITE PO TABS
1.0000 | ORAL_TABLET | Freq: Every day | ORAL | Status: DC
  Administered 2021-10-19 – 2021-10-22 (×4): 1 via ORAL
  Filled 2021-10-19 (×4): qty 1

## 2021-10-19 MED ORDER — ACETAMINOPHEN 500 MG PO TABS
1000.0000 mg | ORAL_TABLET | Freq: Three times a day (TID) | ORAL | Status: DC
  Administered 2021-10-19 – 2021-10-22 (×7): 1000 mg via ORAL
  Filled 2021-10-19 (×9): qty 2

## 2021-10-19 MED ORDER — SODIUM THIOSULFATE 250 MG/ML IV SOLN
25.0000 g | INTRAVENOUS | Status: DC
  Filled 2021-10-19: qty 100

## 2021-10-19 MED ORDER — ZINC SULFATE 220 (50 ZN) MG PO CAPS
220.0000 mg | ORAL_CAPSULE | Freq: Every day | ORAL | Status: DC
  Administered 2021-10-19 – 2021-10-22 (×4): 220 mg via ORAL
  Filled 2021-10-19 (×4): qty 1

## 2021-10-19 MED ORDER — HYDROMORPHONE HCL 2 MG PO TABS: 1.0000 mg | ORAL_TABLET | ORAL | Status: DC | PRN

## 2021-10-19 MED ORDER — ASCORBIC ACID 500 MG PO TABS
250.0000 mg | ORAL_TABLET | Freq: Two times a day (BID) | ORAL | Status: DC
  Administered 2021-10-19 – 2021-10-22 (×6): 250 mg via ORAL
  Filled 2021-10-19 (×6): qty 1

## 2021-10-19 MED ORDER — ALBUMIN HUMAN 25 % IV SOLN
INTRAVENOUS | Status: AC
  Administered 2021-10-19: 25 g
  Filled 2021-10-19: qty 100

## 2021-10-19 NOTE — Progress Notes (Signed)
? LOS: 6 days  ?Patient Summary: Scott Castro is a 66 year old male with a past medical history of paroxysmal atrial fibrillation on eliquis, ESRD on HD MWF (19 years) s/p failed kidney transplant, Barrett's esophagus, and critical limb ischemia leading to bilateral BKA and R AKA, who presented with a non-healing wounds leading to revision of L BKA to AKA and R AKA to higher R AKA. ? ?Overnight Events: Hypotensive overnight and IMTS paged. Night team evaluated patient at bedside. Patient endorsed fatigue, but denied lightheadedness, dizziness, headache, chest pain, and shortness of breath. No change in mental status. ? ?Interim History: Patient seen and evaluated by team at bedside. He reports feeling better than yesterday, but still has some difficulty with swallowing that improves if he drinks water. He was eating breakfast during rounds. He reports feeling lightheaded for the past hour. He denies headache, dyspnea, abdominal pain, and chest pain. He denies leg pain. We discussed palliative team consult to further discuss goals of care and patient is agreeable.  ? ?Objective: ?Vital signs in last 24 hours: ?Vitals:  ? 10/19/21 1100 10/19/21 1130 10/19/21 1200 10/19/21 1230  ?BP: 96/83 (!) 85/58 (!) 94/44 98/83  ?Pulse: (!) 113  (!) 110   ?Resp: '13 14  18  '$ ?Temp:      ?TempSrc:      ?SpO2:      ?Weight:      ?Height:      ? ?SpO2: 95 % ?Weight change: -2.5 kg ? ?Intake/Output Summary (Last 24 hours) at 10/19/2021 1237 ?Last data filed at 10/19/2021 0908 ?Gross per 24 hour  ?Intake 1967 ml  ?Output 0 ml  ?Net 1967 ml  ?Physical Exam ?Constitutional: Malnourished male laying in bed eating breakfast. In no acute distress ?Cardiovascular: Sinus tachycardia, referred sound from AV fistula heard. ?Respiratory: Normal effort, no respiratory distress on room air. ?Abdominal: Soft, non-tender, non-distended. ?Musculoskeletal: Diffuse edema throughout. Surgical staples intact on bilateral amputation sites with no active  bleeding. ?Neurological: Alert and oriented x4, no apparent focal deficits noted on exam. ?Skin: Warm and dry. Ecchymoses noted on bilateral upper extremities. ? ?Lab Results: ?Lactic acid 0.9 ?Heparin induced platelet Ab pending ?Renal function panel pending ?CBC pending ?Serotonin release assay pending ? ?CBC Latest Ref Rng & Units 10/18/2021 10/18/2021 10/17/2021  ?WBC 4.0 - 10.5 K/uL 9.5 9.8 9.5  ?Hemoglobin 13.0 - 17.0 g/dL 8.2(L) 9.2(L) 9.2(L)  ?Hematocrit 39.0 - 52.0 % 24.7(L) 27.4(L) 28.0(L)  ?Platelets 150 - 400 K/uL 92(L) 82(L) 88(L)  ? ?Studies/Results: ?EKG: Sinus tachycardia, rate 111. RBBB, LVH. ? ?No results found.  ? ?Medications: ?Scheduled Meds: ? sodium chloride   Intravenous Once  ? sodium chloride   Intravenous Once  ? acetaminophen  1,000 mg Oral TID  ? atorvastatin  10 mg Oral QHS  ? Chlorhexidine Gluconate Cloth  6 each Topical Q0600  ? gabapentin  100 mg Oral BID  ? Gerhardt's butt cream   Topical Daily  ? midodrine  15 mg Oral QID  ? sertraline  100 mg Oral Daily  ? ?Continuous Infusions: ? sodium chloride    ? sodium thiosulfate infusion for calciphylaxis Stopped (10/17/21 1329)  ? ?PRN Meds:.HYDROmorphone, methocarbamol, ondansetron **OR** ondansetron (ZOFRAN) IV, oxyCODONE, senna-docusate, simethicone ?Assessment/Plan: ?Principal Problem: ?  Nonhealing surgical wound ?Active Problems: ?  ESRD on hemodialysis ([place]) ? ?#L AKA 2/2 non-healing wound on L BKA ?#Revision of R AKA ?#Hypotension ?#Anasarca ?POD 3 from revision of L BKA to AKA and revision of R AKA to higher  R AKA. Per vascular surgery, incisions are healing nicely. He remains afebrile without leukocytosis. Hgb 8.2 down from 9.2 yesterday.  Unable to tolerate UF at HD on 3/11 due to hypotension. Plan for HD today on MWF schedule with additional HD on Tuesday 3/14 for UF. Patient has diffuse edema on exam. Unable to draw morning labs from midline. Will draw labs at HD today. Blood pressure is measured using cuff on right forearm due  to midline on right upper arm and AV fistula on left upper arm, so may be inaccurate. Will check lactic acid for hypoperfusion.  ?-VVS following, appreciate their assistance ?            - Dietary consult ? - Daily dressing change ?- Discontinue Cefepime ?- Discontinue Vancomycin ?- Trend CBC and fever curve ?- Increase Midodrine to '15mg'$  QID ?- Lactic acid ?- Palliative care consult ?  ?#Paroxysmal Atrial Fibrillation w/ RVR ?Due to hypotension, diltiazem discontinued. On exam, patient was in sinus tachycardia.  Heparin discontinued due to concern for thrombocytopenia and ongoing oozing. We will continue to trend CBC and monitor for improvement in bleeding after discontinuation of heparin. Will restart Eliquis before discharge. ?- Avoid amiodarone given pulmonology findings. ?- Keep Mg >2 ?- Keep K >4 ?  ?#ESRD on HD MWF with anuria ?#Secondary hyperparathyroidism ?HD today on MWF schedule with additional session 3/14 for UF. ?- Nephrology consulted, appreciate their assistance with HD ?- Trend renal function panel ?- Avoid nephrotoxic medications ? ?Prior to Admission Living Arrangement: SNF ?Anticipated Discharge Location: SNF ?Barriers to Discharge: medical stability ?Dispo: Anticipated discharge in approximately 2 day(s).  ? ?This is a Careers information officer Note.  The care of the patient was discussed with Dr. Dareen Piano and the assessment and plan formulated with their assistance.  Please see their attached note for official documentation of the daily encounter. ? ?Nelva Nay, Medical Student ?10/19/2021, 12:37 PM ?Pager: [phone] ?After 5pm on weekdays and 1pm on weekends: On Call pager [phone] ?  ?

## 2021-10-19 NOTE — Plan of Care (Signed)
  Problem: Education: Goal: Knowledge of General Education information will improve Description: Including pain rating scale, medication(s)/side effects and non-pharmacologic comfort measures Outcome: Progressing   Problem: Health Behavior/Discharge Planning: Goal: Ability to manage health-related needs will improve Outcome: Progressing   Problem: Pain Managment: Goal: General experience of comfort will improve Outcome: Progressing   

## 2021-10-19 NOTE — Progress Notes (Addendum)
[facility] ? ?POD 3 LEFT AKA AND REVISION OF RIGHT AKA: Both incisions are healing nicely.  There is some serous drainage.  I have written for daily dressing changes.  These need to be done daily and as needed depending on how much drainage there is. ? ?NUTRITION: I have consulted the dietitian to assess his nutritional needs.  Given his severe protein calorie malnutrition this will clearly be an issue with wound healing. ? ?ID: He is on intravenous Maxipime and vancomycin.  From our standpoint his antibiotics can be discontinued. ? ?DVT PROPHYLAXIS: Reportedly his heparin is on hold given his thrombocytopenia and a concern for HIT. ? ?Gae Gallop, MD ?8:02 AM ? ? ?Subjective  - Pain controlled ? ? ?Objective ?(!) 79/68 ?(!) 108 ?97.7 ?F (36.5 ?C) (Oral) ?15 ?94% ? ?Intake/Output Summary (Last 24 hours) at 10/19/2021 0657 ?Last data filed at 10/19/2021 0345 ?Gross per 24 hour  ?Intake 2444 ml  ?Output 0 ml  ?Net 2444 ml  ? ? ?Left AKA with dusky central/lateral incision are.  SS drainage ?Right AKA healing well ?Lungs non labored breathing ?General no acute distress. ? ?Assessment/Planning: ?66 y.o. male is status post bilateral above-knee amputations.  ? ?Incisions open to air ?Left AKA with SS drainage patient is ESRD with tissue fluid over load noted during surgery.  Right AKA healing well ?On IV Cefepime and Vancomycin for sepsis ?Hypotension work up pending per IM.  A & O in no acute ditress. ?Currently off anticoagulation and pending HIT panel ? ?Roxy Horseman ?10/19/2021 ?6:57 AM ?-- ? ?Laboratory ?Lab Results: ?Recent Labs  ?  10/18/21 ?0440 10/18/21 ?1300  ?WBC 9.8 9.5  ?HGB 9.2* 8.2*  ?HCT 27.4* 24.7*  ?PLT 82* 92*  ? ?BMET ?Recent Labs  ?  10/17/21 ?0420 10/18/21 ?0440  ?NA 135 135  ?K 4.6 4.2  ?CL 98 98  ?CO2 24 25  ?GLUCOSE 74 68*  ?BUN 27* 18  ?CREATININE 4.07* 2.73*  ?CALCIUM 9.5 9.2  ? ? ?COAG ?Lab Results  ?Component Value Date  ? INR 1.6 (H) 10/13/2021  ? INR  1.7 (H) 08/05/2021  ? INR 1.1 07/18/2021  ? ?No results found for: PTT ? ? ? ?

## 2021-10-19 NOTE — Progress Notes (Addendum)
[facility] ?Progress Note ? ? OP HD: MWF [place] (was [employer])    ?    4h  400/600  60.3kg  2/2.5 bath  LUA AVG  Hep none ? - hect 3 ug tiw IV (dc'd here) ? - sodium thiosulfate 25 gm tiw IV  ? - home: renvela packet 2.4gm ac tid ?   ?Assessment/ Plan: ?Wound infection of BKA stump - IV abx per pmd. SP new L AKA and revision of R AKA 3/10 by VVS.  ?Hypotension - on midodrine 10 tid, ^ to 15 tid. BP's soft. Overall this is not good w/ BP's which are not coming up. Pt is DNR. Pt is not a candidate for CRRT, pressor support etc.  Will need to get palliative care involved most likely.  ?Volume - wt's cont to climb ?Afib RVR - per pmd ?ESRD - on HD MWF. HD today off schedule. Resume MWF on Monday and will put on for Tuesday as well primarily for UF (left leg wound is closed w staples but swollen likely from fluid). Will also ultrasound the left arm access as on PE the inflow limb aneurysm is very firm (thrombus?) but strong bruit  ?Anemia ckd - Hb 11, no esa needs ?MBD ckd - Ca++ high and hx of calciphylaxis, hold any Ca/ vit D products. Phos in range, cont binder.  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 DC'd hectorol.  ?DNR ? ? ?Subjective: seen in room, no new c/o, denies f/c/n/v/sob ? ?Vitals:  ? 10/18/21 2131 10/19/21 0034 10/19/21 0100 10/19/21 0341  ?BP: (!) 86/74 105/60  (!) 79/68  ?Pulse: (!) 107 (!) 108 (!) 106 (!) 108  ?Resp: '14 14 11 15  '$ ?Temp: 97.6 ?F (36.4 ?C) 97.7 ?F (36.5 ?C)  97.7 ?F (36.5 ?C)  ?TempSrc: Oral Oral  Oral  ?SpO2: 95% 97% 97% 94%  ?Weight:    66.8 kg  ?Height:      ? ? ?Exam: ?Gen alert, frail, nasal cann O2, no distress ?No jvd or bruits ?Chest clear bilat to bases ?RRR no RG ?Abd soft ntnd ?Ext bilat AKA, diffuse 2+ UE and LE stump edema ?Neuro is alert, Ox 3 , nf ?  LUA AVF+bruit (firm in aneurysm in arterial limb), clot? But strong bruit ?  ?  ?  ?  ? Home meds include - eliquis 5 bid, lipitor, neurontin 100 bid, midodrine 10  tid, zoloft,  ?  ?  CXR - 3/7- chronic changes, mild CM, no gross edema , small bilat effusions ?     ? ? ?Recent Labs  ?Lab 10/17/21 ?0420 10/17/21 ?2255 10/18/21 ?0440 10/18/21 ?1300  ?K 4.6  --  4.2  --   ?BUN 27*  --  18  --   ?CREATININE 4.07*  --  2.73*  --   ?ALBUMIN 1.6*  --  <1.5*  --   ?CALCIUM 9.5  --  9.2  --   ?PHOS 6.6*  --  5.3*  --   ?HGB 7.1*   < > 9.2* 8.2*  ? < > = values in this interval not displayed.  ? ?Inpatient medications: ? sodium chloride   Intravenous Once  ? sodium chloride   Intravenous Once  ? atorvastatin  10 mg Oral QHS  ? Chlorhexidine Gluconate Cloth  6 each Topical Q0600  ? gabapentin  100 mg Oral BID  ? Gerhardt's butt cream   Topical Daily  ? midodrine  15 mg Oral QID  ? sertraline  100 mg Oral Daily  ? ? ceFEPime (MAXIPIME) IV 1 g (10/18/21 1515)  ? sodium chloride    ? sodium thiosulfate infusion for calciphylaxis Stopped (10/17/21 1329)  ? vancomycin Stopped (10/17/21 1318)  ? ?acetaminophen **OR** acetaminophen, HYDROmorphone (DILAUDID) injection, methocarbamol, ondansetron **OR** ondansetron (ZOFRAN) IV, oxyCODONE, senna-docusate, simethicone ? ? ? ? ? ? ?

## 2021-10-19 NOTE — Progress Notes (Signed)
Speech Language Pathology Treatment: Dysphagia  ?Patient Details ?Name: Scott Castro ?MRN: [id number] ?DOB: 26-Jul-1956 ?Today's Date: 10/19/2021 ?Time: 1737-1750 ?SLP Time Calculation (min) (ACUTE ONLY): 13 min ? ?Assessment / Plan / Recommendation ?Clinical Impression ? [name] was alert and talkative.  He was last seen by SLP for MBS on 3/9 and liquids were thickened to nectar temporarily due to penetration of thins into the larynx with difficulty clearing; no aspiration viewed on MBS.  [name] talked about how dry his mouth is - sips of water offered him some relief but he coughed consistently each time. Per review of MBS his swallow is functional but weak - difficult to know how much improvement will be seen.  Results of test didn't offer explanation for his c/o of pill/solid food dysphagia, other than the pharyngeal retention noted. For now, it may be better to stay on nectar thick liquids until his medical condition improves to some degree. ?Recommend allowing ice chips for comfort  and within 1200 cc allowance.  SLP will follow.  ?  ?HPI HPI: Scott Castro is a 66 yo male presenting from [facility] on 10/13/21 after being seen in the office by vascular surgery for a non-healing wound on recent left surgical site (hx critical limb ischemia and bilateral BKA and R AKA). 3/10 underwent revision of left below-knee amputation with conversion to above-knee amputation and  revision of right above-knee amputation to higher above-knee amputation.  PMH includes ESRD on dialysis (19 years), hypertension, hyperlipidemia, PAF, depression, and chronic respiratory failure 2 L of oxygen, Barrett's esophagus. Admitted for left BKA conversion to an AKA. Pt known to SLP from last admission in January, during which swallowing was evaluated due to complaints of pill dysphagia. Last EGD was August 2017 with confirmed findings of Barrett's esophagus. ?  ?   ?SLP Plan ? Continue with current plan of care ? ?  ?  ?Recommendations  for follow up therapy are one component of a multi-disciplinary discharge planning process, led by the attending physician.  Recommendations may be updated based on patient status, additional functional criteria and insurance authorization. ?  ? ?Recommendations  ?Diet recommendations: Regular;Nectar-thick liquid ?Liquids provided via: Cup;Straw ?Medication Administration: Whole meds with puree ?Supervision: Patient able to self feed ?Compensations: Slow rate;Small sips/bites;Multiple dry swallows after each bite/sip  ?   ?    ?   ? ? ? ? Oral Care Recommendations: Oral care BID ?Follow Up Recommendations: Skilled nursing-short term rehab (<3 hours/day) ?Assistance recommended at discharge: Intermittent Supervision/Assistance ?SLP Visit Diagnosis: Dysphagia, pharyngeal phase (R13.13) ?Plan: Continue with current plan of care ? ? ? ? ?  ?  ?Scott Wickstrom L. Towanda Hornstein, MA CCC/SLP ?Acute Rehabilitation Services ?Office number [phone] ?Pager [phone] ? ? ?Scott Castro ? ?10/19/2021, 5:52 PM ?

## 2021-10-19 NOTE — Consult Note (Incomplete)
Consultation Note Date: 10/19/2021   Patient Name: Scott Castro  DOB: [birth year]  MRN: [id number]  Age / Sex: 66 y.o., male  PCP: Franciso Bend, MD Referring Physician: Aldine Contes, MD  Reason for Consultation: Establishing goals of care and Psychosocial/spiritual support  HPI/Patient Profile:   Scott Castro is a 66 year old male with a past medical history of paroxysmal atrial fibrillation on eliquis, ESRD on HD MWF (19 years) s/p failed kidney transplant, Barrett's esophagus, and critical limb ischemia leading to bilateral BKA and R AKA, who presented with a non-healing wounds leading to revision of L BKA to AKA and R AKA to higher R AKA.  Patient faces treatment option , advanced directive decisions and    Clinical Assessment and Goals of Care:  This NP Wadie Lessen reviewed medical records, received report from team, assessed the patient and then meet at the patient's bedside and spoke to his cousin/Lori Blevins to discuss diagnosis, prognosis, GOC, EOL wishes disposition and options.   Concept of Palliative Care was introduced as specialized medical care for people and their families living with serious illness.  If focuses on providing relief from the symptoms and stress of a serious illness.  The goal is to improve quality of life for both the patient and the family.  Created space and opportunity for patient  and family to explore thoughts and feelings regarding current medical situation     A  discussion was had today regarding advanced directives.  Concepts specific to code status, artifical feeding and hydration, continued IV antibiotics and rehospitalization was had.  The difference between a aggressive medical intervention path  and a palliative comfort care path for this patient at this time was had.  Values and goals of care important to patient and family were attempted to be elicited.    MOST form    Natural trajectory and expectations at EOL were discussed.  Questions and concerns addressed.  Patient  encouraged to call with questions or concerns.     PMT will continue to support holistically.              {Primary Decision WUJWJ:19147}    SUMMARY OF RECOMMENDATIONS   *** Code Status/Advance Care Planning: {Palliative Code status:23503}   Symptom Management:  ***  Palliative Prophylaxis:  {Palliative Prophylaxis:21015}  Additional Recommendations (Limitations, Scope, Preferences): {Recommended Scope and Preferences:21019}  Psycho-social/Spiritual:  Desire for further Chaplaincy support:{YES NO:22349} Additional Recommendations: {PAL SOCIAL:21064}  Prognosis:  {Palliative Care Prognosis:23504}  Discharge Planning: {Palliative dispostion:23505}      Primary Diagnoses: Present on Admission:  Nonhealing surgical wound   I have reviewed the medical record, interviewed the patient and family, and examined the patient. The following aspects are pertinent.  Past Medical History:  Diagnosis Date   Atrial fibrillation ([place])    Barrett's esophagus    Chronic kidney disease    CHRONIC\   Glomerulosclerosis, focal    Hyperparathyroidism due to renal insufficiency (HCC)    Rheumatic fever    Social History   Socioeconomic  History   Marital status: Single    Spouse name: Not on file   Number of children: Not on file   Years of education: Not on file   Highest education level: Not on file  Occupational History   Not on file  Tobacco Use   Smoking status: Never   Smokeless tobacco: Never  Vaping Use   Vaping Use: Never used  Substance and Sexual Activity   Alcohol use: Not Currently    Comment: very little   Drug use: No   Sexual activity: Not Currently    Birth control/protection: None  Other Topics Concern   Not on file  Social History Narrative   Not on file   Social Determinants of Health   Financial Resource Strain: Not on  file  Food Insecurity: Not on file  Transportation Needs: Not on file  Physical Activity: Not on file  Stress: Not on file  Social Connections: Not on file   Family History  Problem Relation Age of Onset   Heart disease Mother    Heart disease Father    Scheduled Meds:  sodium chloride   Intravenous Once   sodium chloride   Intravenous Once   acetaminophen  1,000 mg Oral TID   atorvastatin  10 mg Oral QHS   Chlorhexidine Gluconate Cloth  6 each Topical Q0600   gabapentin  100 mg Oral BID   Gerhardt's butt cream   Topical Daily   midodrine  15 mg Oral QID   sertraline  100 mg Oral Daily   Continuous Infusions:  ceFEPime (MAXIPIME) IV 1 g (10/18/21 1515)   sodium chloride     sodium thiosulfate infusion for calciphylaxis Stopped (10/17/21 1329)   vancomycin Stopped (10/17/21 1318)   PRN Meds:.HYDROmorphone, methocarbamol, ondansetron **OR** ondansetron (ZOFRAN) IV, oxyCODONE, senna-docusate, simethicone Medications Prior to Admission:  Prior to Admission medications   Medication Sig Start Date End Date Taking? Authorizing Provider  acetaminophen (TYLENOL) 325 MG tablet Take 2 tablets (650 mg total) by mouth every 6 (six) hours as needed for mild pain or moderate pain. 09/16/21  Yes Hongalgi, Lenis Dickinson, MD  albuterol (VENTOLIN HFA) 108 (90 Base) MCG/ACT inhaler Inhale 2 puffs into the lungs every 6 (six) hours as needed for wheezing or shortness of breath. 07/08/21  Yes Enzo Bi, MD  apixaban (ELIQUIS) 5 MG TABS tablet Take 1 tablet (5 mg total) by mouth 2 (two) times daily. 07/22/21  Yes Sharen Hones, MD  atorvastatin (LIPITOR) 10 MG tablet Take 1 tablet (10 mg total) by mouth at bedtime. 07/22/21  Yes Sharen Hones, MD  bisacodyl (DULCOLAX) 10 MG suppository Place 1 suppository (10 mg total) rectally daily as needed for moderate constipation. 09/16/21  Yes Hongalgi, Lenis Dickinson, MD  diltiazem (CARDIZEM CD) 120 MG 24 hr capsule Take 1 capsule (120 mg total) by mouth daily. Hold for sbp <  100 08/12/21  Yes Kc, Maren Beach, MD  gabapentin (NEURONTIN) 100 MG capsule Take 1 capsule (100 mg total) by mouth 2 (two) times daily. 08/12/21  Yes Antonieta Pert, MD  hydrocortisone (ANUSOL-HC) 25 MG suppository Place 25 mg rectally 2 (two) times daily.   Yes [provider]  meropenem (MERREM) IVPB Inject 500 mg into the vein See admin instructions. Qd x 7 days   Yes [provider]  metoprolol tartrate (LOPRESSOR) 25 MG tablet Take 1.5 tablets (37.5 mg total) by mouth 2 (two) times daily. 09/16/21  Yes Hongalgi, Lenis Dickinson, MD  midodrine (PROAMATINE) 10 MG tablet Take  1 tablet (10 mg total) by mouth 3 (three) times daily with meals. 09/16/21  Yes Hongalgi, Lenis Dickinson, MD  multivitamin (RENA-VIT) TABS tablet Take 1 tablet by mouth at bedtime. 09/16/21  Yes Hongalgi, Lenis Dickinson, MD  Nutritional Supplements (FEEDING SUPPLEMENT, NEPRO CARB STEADY,) LIQD Take 237 mLs by mouth 3 (three) times daily between meals. 09/16/21  Yes Hongalgi, Lenis Dickinson, MD  oxyCODONE (OXY IR/ROXICODONE) 5 MG immediate release tablet Take 1 tablet (5 mg total) by mouth every 6 (six) hours as needed for severe pain. 09/16/21  Yes Hongalgi, Lenis Dickinson, MD  pantoprazole (PROTONIX) 40 MG tablet Take 40 mg by mouth daily. 03/27/19  Yes [provider]  polyethylene glycol (MIRALAX / GLYCOLAX) 17 g packet Take 17 g by mouth daily. 09/16/21  Yes Hongalgi, Lenis Dickinson, MD  senna-docusate (SENOKOT-S) 8.6-50 MG tablet Take 2 tablets by mouth 2 (two) times daily. 08/12/21  Yes Antonieta Pert, MD  sertraline (ZOLOFT) 100 MG tablet Take 1 tablet (100 mg total) by mouth daily. 09/17/21  Yes Hongalgi, Lenis Dickinson, MD  sevelamer carbonate (RENVELA) 800 MG tablet Take 2400 mg by mouth three times daily with meals, 800 mg 3 times daily as needed with snacks Patient taking differently: Take 1,600 mg by mouth 3 (three) times daily with meals. 09/16/21  Yes Hongalgi, Lenis Dickinson, MD  witch hazel-glycerin (TUCKS) pad Apply topically as needed for itching. Patient not taking:  Reported on 10/14/2021 09/16/21   Modena Jansky, MD   Allergies  Allergen Reactions   Heparin Anaphylaxis    HIT serology sent 3/12   Review of Systems  Constitutional:  Positive for fatigue.  Neurological:  Positive for weakness.   Physical Exam Constitutional:      Appearance: He is cachectic. He is ill-appearing.  Cardiovascular:     Rate and Rhythm: Tachycardia present.  Musculoskeletal:     Comments: Generalized weakness and muscle atrophy   Skin:    General: Skin is warm and dry.  Neurological:     Mental Status: He is lethargic.    Vital Signs: BP 97/60 (BP Location: Right Wrist)    Pulse (!) 117    Temp 97.9 F (36.6 C) (Oral)    Resp (!) 22    Ht '5\' 9"'$  (1.753 m)    Wt 66.8 kg    SpO2 90%    BMI 21.75 kg/m  Pain Scale: 0-10 POSS *See Group Information*: S-Acceptable,Sleep, easy to arouse Pain Score: 2    SpO2: SpO2: 90 % O2 Device:SpO2: 90 % O2 Flow Rate: .   IO: Intake/output summary:  Intake/Output Summary (Last 24 hours) at 10/19/2021 1004 Last data filed at 10/19/2021 0908 Gross per 24 hour  Intake 2207 ml  Output 0 ml  Net 2207 ml    LBM: Last BM Date : 10/16/21 Baseline Weight: Weight: 65 kg Most recent weight: Weight: 66.8 kg     Palliative Assessment/Data: 30% at best      Time In: *** Time Out: *** Time Total: *** Greater than 50%  of this time was spent counseling and coordinating care related to the above assessment and plan.  Signed by: Wadie Lessen, NP   Please contact Palliative Medicine Team phone at [phone] for questions and concerns.  For individual provider: See Shea Evans

## 2021-10-19 NOTE — Care Management Important Message (Signed)
Important Message ? ?Patient Details  ?Name: Scott Castro ?MRN: [id number] ?Date of Birth: [birth year] ? ? ?Medicare Important Message Given:  Yes ? ? ? ? ?Shelli Portilla ?10/19/2021, 3:27 PM ?

## 2021-10-19 NOTE — Progress Notes (Addendum)
Initial Nutrition Assessment ? ?DOCUMENTATION CODES:  ? ?Severe malnutrition in context of chronic illness ? ?INTERVENTION:  ?Mighty Shake II TID with meals, each supplement provides 480-500 kcals and 20-23 grams of protein ?Liberalize diet to regular  ?Renavit daily  ?Encourage adequate PO intake ?Check vitamin A, C and Zinc lab values  ?Vitamin C 250 mg  BID x 30 days  ?Zinc 220 mg BID x 14 days  ? ?NUTRITION DIAGNOSIS:  ? ?Severe Malnutrition related to chronic illness (ESRD on HD) as evidenced by severe fat depletion, severe muscle depletion. ? ? ?GOAL:  ? ?Patient will meet greater than or equal to 90% of their needs ? ? ?MONITOR:  ? ?PO intake, Supplement acceptance, Labs, Weight trends ? ?REASON FOR ASSESSMENT:  ? ?Consult ?Assessment of nutrition requirement/status ? ?ASSESSMENT:  ? ?Pt is a 66 year old male with past medical history significant of paroxysmal A-fib on eliquis, ESRD on HD s/p failed kidney transplant, Barrett's esophagus, glomerulosclerosis, hyperparathyroidism due to renal insufficiency and critical limb ischemia leading to bilateral BKA and R AKA who was sent by vascular surgery concerning for infection of non-healing wound on left surgical site leading to revision of L BKA to AKA and R AKA to higher AKA. ? ?Per chart review, meal completions variable and pt consuming 75% of breakfast today. On 3/11-3/12 pt consumed 10-40% of meals.  ? ?Last HD on today 3/13 with net UF of 2000 mL.  ? ?Met with pt at bedside. Pt just coming back from HD and eating lunch upon entering room. Pt was eating crispy fish, mixed vegetables, white rice and a cake with white icing and had consumed 50% of meal and was still eating on meal when leaving room. Per pt, pt endorses a good appetite today and denies any nausea or vomiting. Pt was unable to recall what pt ate for breakfast this morning, but later was able to remember and reported that he had pancakes for breakfast. Per pt, pt coming from SNF and reports that  he eats three meals a day and that he likes the food there. Pt unable to recall what he eats there for breakfast, lunch and dinner but states that he likes the chicken and [place] at the facility. Per pt, he reports having some constipation and also states that he has watery stools that sometimes leaks out of his diaper. Per chart, no solid BM in one week. Pt reports that he has lost 12 pounds in the last two months, but is unsure of his EDW.  ? ?Encouraged pt to continue to consume consistent meals throughout the day and the importance of adequate PO intake and protein intake for post-op wound healing. Discussed oral nutrition supplement intake with pt and pt agreeable to trying Mighty Shake (chocolate-flavored) and MVI supplementation daily.  ? ?Admit wt: 65 kg ?Current wt: 67 kg ?EDW: 60.3 kg ?Pt with moderate edema during exam. Estimated dry weight suspected to change due to amputations.  ? ?Labs reviewed and include: Phosphorus: 2.4, Potassium: 3.0  ? ?Medications reviewed and include:  ?Continuous Infusions: ? sodium thiosulfate infusion for calciphylaxis Stopped (10/17/21 1329)  ? ? ?NUTRITION - FOCUSED PHYSICAL EXAM: ? ?Flowsheet Row Most Recent Value  ?Orbital Region Moderate depletion  ?Upper Arm Region Moderate depletion  ?Thoracic and Lumbar Region Severe depletion  ?Buccal Region Severe depletion  ?Temple Region Moderate depletion  ?Clavicle Bone Region Severe depletion  ?Clavicle and Acromion Bone Region Severe depletion  ?Scapular Bone Region Severe depletion  ?Dorsal Hand  Moderate depletion  ?Patellar Region Unable to assess  [d/t L AKA and higher R AKA]  ?Anterior Thigh Region Unable to assess  [d/t to L AKA and higher R AKA]  ?Posterior Calf Region Unable to assess  [d/t L AKA and higher R AKA]  ?Edema (RD Assessment) Moderate  ?Hair Reviewed  ?Eyes Reviewed  ?Mouth Reviewed  ?Skin Reviewed  ?Nails Reviewed  ? ?  ? ? ?Diet Order:   ?Diet Order   ? ?       ?  Diet regular Room service appropriate?  Yes; Fluid consistency: Nectar Thick; Fluid restriction: 1200 mL Fluid  Diet effective now       ?  ? ?  ?  ? ?  ? ? ?EDUCATION NEEDS:  ? ?Not appropriate for education at this time ? ?Skin:  Skin Assessment: Skin Integrity Issues: ?Skin Integrity Issues:: Other (Comment), Incisions ?Incisions: (closed); left and right anterior distal thigh ?Other: wound/incision - MASD; perineum ? ?Last BM:  3/12; type 6 ? ?Height:  ? ?Ht Readings from Last 1 Encounters:  ?10/16/21 _0  (1.753 m)  ? ? ?Weight:  ? ?Wt Readings from Last 1 Encounters:  ?10/19/21 65 kg  ? ? ?BMI:  Body mass index is 21.16 kg/m?. ? ?Estimated Nutritional Needs:  ? ?Kcal:  1750 - 1950 ? ?Protein:  85 - 100 grams ? ?Fluid:  1000 mL + UOP ? ? ? ?Maryruth Hancock, Dietetic Intern ?10/19/2021 4:46 PM ?

## 2021-10-19 NOTE — Progress Notes (Signed)
Brief PCCM Progress Note ? ?Seen at bedside this morning, a little lethargic as before.  ? ?# Hypotension ?- Happy to be reinvolved if there is decline in mental status, elevated lactic as surrogate for hypoperfusion ? ?Will sign off ? ?Walker Shadow  ?[place] ? ?

## 2021-10-20 ENCOUNTER — Inpatient Hospital Stay (HOSPITAL_COMMUNITY): Payer: Medicare Other

## 2021-10-20 ENCOUNTER — Encounter (HOSPITAL_COMMUNITY): Payer: Self-pay | Admitting: Internal Medicine

## 2021-10-20 DIAGNOSIS — N186 End stage renal disease: Secondary | ICD-10-CM | POA: Diagnosis not present

## 2021-10-20 DIAGNOSIS — I083 Combined rheumatic disorders of mitral, aortic and tricuspid valves: Secondary | ICD-10-CM

## 2021-10-20 DIAGNOSIS — R0609 Other forms of dyspnea: Secondary | ICD-10-CM

## 2021-10-20 DIAGNOSIS — I5030 Unspecified diastolic (congestive) heart failure: Secondary | ICD-10-CM

## 2021-10-20 DIAGNOSIS — I517 Cardiomegaly: Secondary | ICD-10-CM

## 2021-10-20 DIAGNOSIS — Z515 Encounter for palliative care: Secondary | ICD-10-CM | POA: Diagnosis not present

## 2021-10-20 DIAGNOSIS — I480 Paroxysmal atrial fibrillation: Secondary | ICD-10-CM

## 2021-10-20 DIAGNOSIS — R531 Weakness: Secondary | ICD-10-CM | POA: Diagnosis not present

## 2021-10-20 DIAGNOSIS — T8189XA Other complications of procedures, not elsewhere classified, initial encounter: Secondary | ICD-10-CM | POA: Diagnosis not present

## 2021-10-20 LAB — RENAL FUNCTION PANEL
Albumin: 2.1 g/dL — ABNORMAL LOW (ref 3.5–5.0)
Anion gap: 18 — ABNORMAL HIGH (ref 5–15)
BUN: 18 mg/dL (ref 8–23)
CO2: 23 mmol/L (ref 22–32)
Calcium: 9.8 mg/dL (ref 8.9–10.3)
Chloride: 98 mmol/L (ref 98–111)
Creatinine, Ser: 2.58 mg/dL — ABNORMAL HIGH (ref 0.61–1.24)
GFR, Estimated: 27 mL/min — ABNORMAL LOW (ref >60)
Glucose, Bld: 110 mg/dL — ABNORMAL HIGH (ref 70–99)
Phosphorus: 4.2 mg/dL (ref 2.5–4.6)
Potassium: 3.9 mmol/L (ref 3.5–5.1)
Sodium: 139 mmol/L (ref 135–145)

## 2021-10-20 LAB — ECHOCARDIOGRAM LIMITED
Area-P 1/2: 6.32 cm2
Calc EF: 42.5 %
Height: 69 in
S' Lateral: 4 cm
Single Plane A2C EF: 43.4 %
Single Plane A4C EF: 41.7 %
Weight: 2324.53 oz

## 2021-10-20 LAB — CBC
HCT: 23.8 % — ABNORMAL LOW (ref 39.0–52.0)
Hemoglobin: 8.1 g/dL — ABNORMAL LOW (ref 13.0–17.0)
MCH: 30.9 pg (ref 26.0–34.0)
MCHC: 34 g/dL (ref 30.0–36.0)
MCV: 90.8 fL (ref 80.0–100.0)
Platelets: 77 10*3/uL — ABNORMAL LOW (ref 150–400)
RBC: 2.62 MIL/uL — ABNORMAL LOW (ref 4.22–5.81)
RDW: 16.9 % — ABNORMAL HIGH (ref 11.5–15.5)
WBC: 5.3 10*3/uL (ref 4.0–10.5)
nRBC: 0 % (ref 0.0–0.2)

## 2021-10-20 LAB — SURGICAL PATHOLOGY

## 2021-10-20 IMAGING — DX DG CHEST 1V PORT
1 series · 1 of 1 positions shown · non-contrast
Comparison: [DATE]

CLINICAL DATA: Encounter for dyspnea.

EXAM:
PORTABLE CHEST 1 VIEW

[chest]
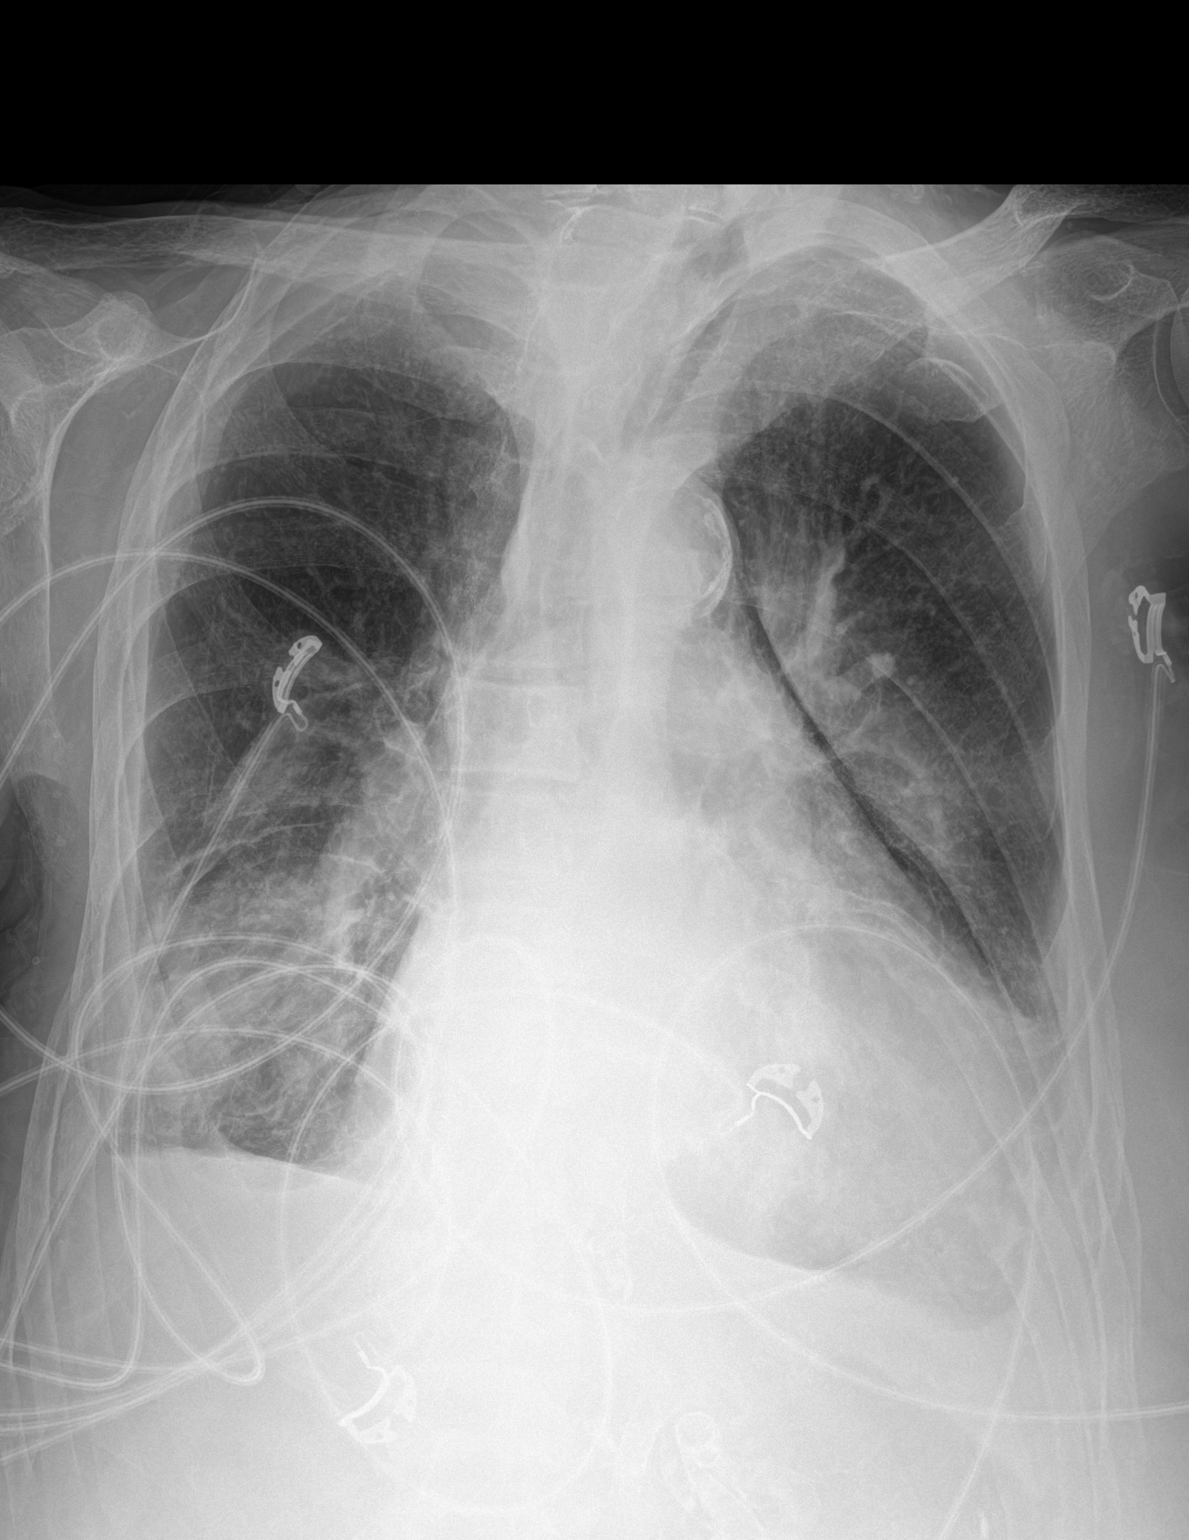

[1 of 1 positions shown; findings below may reference images not displayed]

FINDINGS: Single frontal view of the chest. Cardiac silhouette is again
moderately enlarged. Moderate calcification within aortic arch.
There are small bilateral pleural effusions, unchanged from prior
with tracking up the bilateral lateral pleura.

Heterogeneous airspace opacity within the left mid lung and right
lower lung, similar to prior. No pneumothorax. There is lucency that
may represent a skin fold overlying the mid left lung.

Mild dextrocurvature of the midthoracic spine.
IMPRESSION: :
IMPRESSION: 1. No significant change from prior.
2. Small bilateral pleural effusions.
3. Left midlung and right basilar heterogeneous airspace opacities
suspicious for infection.

## 2021-10-20 MED ORDER — ALBUMIN HUMAN 25 % IV SOLN
INTRAVENOUS | Status: AC
  Filled 2021-10-20: qty 100

## 2021-10-20 MED ORDER — AMIODARONE LOAD VIA INFUSION
150.0000 mg | Freq: Once | INTRAVENOUS | Status: AC
  Administered 2021-10-20: 150 mg via INTRAVENOUS
  Filled 2021-10-20: qty 83.34

## 2021-10-20 MED ORDER — ALBUMIN HUMAN 25 % IV SOLN
25.0000 g | Freq: Once | INTRAVENOUS | Status: AC
  Administered 2021-10-20: 25 g via INTRAVENOUS

## 2021-10-20 MED ORDER — AMIODARONE HCL IN DEXTROSE 360-4.14 MG/200ML-% IV SOLN
60.0000 mg/h | INTRAVENOUS | Status: DC
Start: 2021-10-20 — End: 2021-10-20
  Administered 2021-10-20 (×2): 60 mg/h via INTRAVENOUS
  Filled 2021-10-20: qty 200

## 2021-10-20 MED ORDER — AMIODARONE HCL IN DEXTROSE 360-4.14 MG/200ML-% IV SOLN
30.0000 mg/h | INTRAVENOUS | Status: DC
  Administered 2021-10-21 – 2021-10-23 (×5): 30 mg/h via INTRAVENOUS
  Filled 2021-10-20 (×7): qty 200

## 2021-10-20 NOTE — Progress Notes (Addendum)
[facility] ? ?VASCULAR SURGERY ASSESSMENT & PLAN:  ? ?POD 4 LEFT AKA AND REVISION OF RIGHT AKA: Both incisions are healing nicely.  Continue daily dressing changes. ? ?NUTRITION: He has severe protein calorie malnutrition.  Appreciate assistance from the nutrition team. ?  ?ID: His intravenous antibiotics have been discontinued. ?  ?DVT PROPHYLAXIS: Reportedly his heparin is on hold given his thrombocytopenia and a concern for HIT.  Platelet count today was down to 72,000. ? ?Gae Gallop, MD ?9:09 AM ? ? ? ? ?Subjective  - No new complaints ? ? ?Objective ?98/66 ?(!) 112 ?98.2 ?F (36.8 ?C) (Oral) ?16 ?93% ? ?Intake/Output Summary (Last 24 hours) at 10/20/2021 0737 ?Last data filed at 10/20/2021 0400 ?Gross per 24 hour  ?Intake 303 ml  ?Output 2001 ml  ?Net -1698 ml  ? ? ?Dressing in place B AKA ?SS drainage left > right Continue daily dressing changes to B AKA ?Lungs non labored breathing ?General no acute distress ? ?Assessment/Planning: ?POD # 4 Revision B AKA for non healing incisional wounds ? ?Dry dressing changes as needed B AKA to keep dry and prevent skin breakdown. ?Cont. IV antibiotics per primary team ? ?Roxy Horseman ?10/20/2021 ?7:37 AM ?-- ? ?Laboratory ?Lab Results: ?Recent Labs  ?  10/18/21 ?1300 10/19/21 ?1346  ?WBC 9.5 5.5  ?HGB 8.2* 8.0*  ?HCT 24.7* 24.1*  ?PLT 92* 71*  ? ?BMET ?Recent Labs  ?  10/18/21 ?0440 10/19/21 ?1347  ?NA 135 136  ?K 4.2 3.0*  ?CL 98 99  ?CO2 25 28  ?GLUCOSE 68* 82  ?BUN 18 7*  ?CREATININE 2.73* 1.26*  ?CALCIUM 9.2 8.1*  ? ? ?COAG ?Lab Results  ?Component Value Date  ? INR 1.6 (H) 10/13/2021  ? INR 1.7 (H) 08/05/2021  ? INR 1.1 07/18/2021  ? ?No results found for: PTT ? ? ? ?

## 2021-10-20 NOTE — Progress Notes (Signed)
SLP Cancellation Note ? ?Patient Details ?Name: DEMPSY DAMIANO ?MRN: [id number] ?DOB: Sep 04, 1955 ? ? ?Cancelled treatment:       Reason Eval/Treat Not Completed: Patient at procedure or test/unavailable- HD. Will continue efforts. ? ?Bernedette Auston L. Mitul Hallowell, MA CCC/SLP ?Acute Rehabilitation Services ?Office number [phone] ?Pager [phone] ? ? ? ?[name] ?10/20/2021, 2:32 PM ? ? ?

## 2021-10-20 NOTE — Progress Notes (Signed)
Patient ID: [name], male   DOB: 1956-01-17, 66 y.o.   MRN: [id number] ? ? ? ?Progress Note from the Palliative Medicine Team at [facility] ? ? ?Patient Name: Scott Castro        ?Date: 10/20/2021 ?DOB: 07-29-1956  Age: 66 y.o. MRN#: [id number] ?Attending Physician: Aldine Contes, MD ?Primary Care Physician: Franciso Bend, MD ?Admit Date: 10/13/2021 ? ? ?Medical records reviewed  ? ?[name] is a 66 year old male with a past medical history of paroxysmal atrial fibrillation on eliquis, ESRD on HD MWF (19 years) s/p failed kidney transplant, Barrett's esophagus, and critical limb ischemia leading to bilateral BKA and R AKA, who presented with a non-healing wounds leading to revision of L BKA to AKA and R AKA to higher R AKA. ?  ?Patient faces treatment option , advanced directive decisions and anticipatory care needs.  ? ?This NP visited patient at the bedside as a follow up to  yesterday's [place].  Continued conversation regarding patient's complex medical situation, multiple comorbidities and the seriousness of his current medical situation. ? ?Education offered to patient on concepts related to adult failure to thrive and the limitations of medical interventions to prolong life when a body fails to thrive.  ? ?Education offered on his high risk to decompensate.  Patient verbalizes "I understand", however at this time he remains open to all offered and available medical interventions top prolong life.  ? ?I spoke to his HPOA/Lori Blevins  and expressed the medical teams concerns.  Questions and concerns addressed.  Cecille Rubin had questions regarding natural trajectory of this situation.   Education offered that it may come to a point when [name] will not be able to continue with dialysis and then prognosis will clearly be days to weeks.  ? ? ?Education with patient on the importance of continued conversation with Cecille Rubin and his  medical providers regarding overall plan of care and treatment options,   ensuring decisions are within the context of the patients values and GOCs. ? ?Questions and concerns addressed   Discussed with Dr Dareen Piano ? ?PMT will continue to support holistically  ? ? ?Wadie Lessen NP  ?Palliative Medicine Team Team Phone # [phone] ?Pager [phone] ?  ?

## 2021-10-20 NOTE — NC FL2 (Signed)
?[employer] MEDICAID FL2 LEVEL OF CARE SCREENING TOOL  ?  ? ?IDENTIFICATION  ?Patient Name: ?Scott Castro Birthdate: 01-25-1956 Sex: male Admission Date (Current Location): ?10/13/2021  ?[place] and [place] Number: ? [place] ?  Facility and Address:  ?[facility], [place] [address] [place], [place] [address] ?     Provider Number: ?[id number]  ?Attending Physician Name and Address:  ?Aldine Contes, MD ? Relative Name and Phone Number:  ?Christia Reading, [phone] ?   ?Current Level of Care: ?Hospital Recommended Level of Care: ?[place] Prior Approval Number: ?  ? ?Date Approved/Denied: ?  PASRR Number: ?[id number] ? ?Discharge Plan: ?SNF ?  ? ?Current Diagnoses: ?Patient Active Problem List  ? Diagnosis Date Noted  ? Nonhealing surgical wound 10/13/2021  ? Diarrhea, unspecified 10/05/2021  ? Other disorders of calcium metabolism 09/28/2021  ? Allergy, unspecified, initial encounter 09/18/2021  ? Anaphylactic shock, unspecified, initial encounter 09/18/2021  ? Pain, unspecified 09/18/2021  ? Acquired absence of left leg below knee ([place]) 09/17/2021  ? Dependence on renal dialysis ([place]) 09/17/2021  ? Hypertensive chronic kidney disease with stage 5 chronic kidney disease or end stage renal disease ([place]) 09/17/2021  ? Iron deficiency anemia, unspecified 09/17/2021  ? Kidney transplant failure 09/17/2021  ? Long term (current) use of anticoagulants 09/17/2021  ? Personal history of COVID-19 09/17/2021  ? Pulmonary hypertension, unspecified ([place]) 09/17/2021  ? Renal osteodystrophy 09/17/2021  ? Secondary hyperparathyroidism of renal origin ([place]) 09/17/2021  ? Protein-calorie malnutrition, severe 09/15/2021  ? AF (paroxysmal atrial fibrillation) ([place]) 08/30/2021  ? Depressive reaction   ? S/P BKA (below knee amputation), right ([place]) 08/12/2021  ? Ischemic foot ulcer due to atherosclerosis of native artery of limb ([place]) 08/05/2021  ? Atrial fibrillation ([place]) 08/05/2021  ? Critical  limb ischemia of left lower extremity/PAD  07/18/2021  ? Atrial fibrillation with RVR ([place]) 07/18/2021  ? Hypoalbuminemia due to protein-calorie malnutrition ([place]) 07/03/2021  ? Anemia 07/03/2021  ? PAD (peripheral artery disease) ([place]) 07/03/2021  ? GERD (gastroesophageal reflux disease) 07/03/2021  ? Mixed hyperlipidemia 07/03/2021  ? Bicipital tenosynovitis 12/04/2020  ? Localized, primary osteoarthritis of hand 12/04/2020  ? Hyperkalemia   ? ESRD on hemodialysis ([place]) 09/18/2019  ? Chronic respiratory failure with hypoxia ([place]) 09/17/2019  ? COPD (chronic obstructive pulmonary disease) ([place]) 04/10/2019  ? Nonintractable epilepsy with complex partial seizures ([place]) 08/20/2014  ? Lung nodule, multiple 07/28/2012  ? Arteriovenous fistula ([place]) 01/21/2012  ? At risk for dental problems 01/21/2012  ? Barrett esophagus 01/21/2012  ? FSGS (focal segmental glomerulosclerosis) 01/21/2012  ? Kidney transplanted 01/21/2012  ? ? ?Orientation RESPIRATION BLADDER Height & Weight   ?  ?Self, Time, Situation, Place ? Normal Continent Weight: 145 lb 4.5 oz (65.9 kg) ?Height:  '5\' 9"'$  (175.3 cm)  ?BEHAVIORAL SYMPTOMS/MOOD NEUROLOGICAL BOWEL NUTRITION STATUS  ?    Continent Diet (See DC summary)  ?AMBULATORY STATUS COMMUNICATION OF NEEDS Skin   ?  Verbally Skin abrasions, Surgical wounds (MASD Perineum, L BKA, R AKA) ?  ?  ?  ?    ?     ?     ? ? ?Personal Care Assistance Level of Assistance  ?Bathing, Feeding, Dressing   ?  ?  ?   ? ?Functional Limitations Info  ?Sight, Hearing, Speech Sight Info: Impaired ?Hearing Info: Adequate ?Speech Info: Adequate  ? ? ?SPECIAL CARE FACTORS FREQUENCY  ?PT (By licensed PT), OT (By licensed OT)   ?  ?PT Frequency:  5x week ?OT Frequency: 5x week ?  ?  ?  ?   ? ? ?Contractures Contractures Info: Not present  ? ? ?Additional Factors Info  ?Code Status, Allergies, Psychotropic Code Status Info: DNR ?Allergies Info: NKA ?Psychotropic Info: Setraline ?  ?  ?   ? ?Current Medications (10/20/2021):  This  is the current hospital active medication list ?Current Facility-Administered Medications  ?Medication Dose Route Frequency Provider Last Rate Last Admin  ? 0.9 %  sodium chloride infusion (Manually program via Guardrails IV Fluids)   Intravenous Once Lacinda Axon, MD      ? 0.9 %  sodium chloride infusion (Manually program via Guardrails IV Fluids)   Intravenous Once Farrel Gordon, DO      ? acetaminophen (TYLENOL) tablet 1,000 mg  1,000 mg Oral TID Atway, Rayann N, DO   1,000 mg at 10/20/21 1009  ? ascorbic acid (VITAMIN C) tablet 250 mg  250 mg Oral BID Aldine Contes, MD   250 mg at 10/20/21 1010  ? atorvastatin (LIPITOR) tablet 10 mg  10 mg Oral QHS Laurence Slate M, PA-C   10 mg at 10/19/21 2233  ? Chlorhexidine Gluconate Cloth 2 % PADS 6 each  6 each Topical Q0600 Ulyses Amor, PA-C   6 each at 10/19/21 5638  ? gabapentin (NEURONTIN) capsule 100 mg  100 mg Oral BID Laurence Slate M, PA-C   100 mg at 10/20/21 1010  ? Gerhardt's butt cream   Topical Daily Ulyses Amor, [place]   Given at 10/19/21 9373  ? HYDROmorphone (DILAUDID) tablet 1 mg  1 mg Oral Q4H PRN Atway, Rayann N, DO      ? methocarbamol (ROBAXIN) tablet 500 mg  500 mg Oral Q8H PRN Lacinda Axon, MD   500 mg at 10/18/21 4287  ? midodrine (PROAMATINE) tablet 15 mg  15 mg Oral QID Wayland Denis, MD   15 mg at 10/20/21 1013  ? multivitamin (RENA-VIT) tablet 1 tablet  1 tablet Oral QHS Aldine Contes, MD   1 tablet at 10/19/21 2233  ? ondansetron (ZOFRAN) tablet 4 mg  4 mg Oral Q6H PRN Ulyses Amor, PA-C      ? Or  ? ondansetron (ZOFRAN) injection 4 mg  4 mg Intravenous Q6H PRN Laurence Slate M, PA-C      ? oxyCODONE (Oxy IR/ROXICODONE) immediate release tablet 5 mg  5 mg Oral Q4H PRN Atway, Rayann N, DO   5 mg at 10/20/21 1010  ? senna-docusate (Senokot-S) tablet 1 tablet  1 tablet Oral QHS PRN Laurence Slate M, PA-C      ? sertraline (ZOLOFT) tablet 100 mg  100 mg Oral Daily Laurence Slate M, PA-C   100 mg at 10/20/21 1010  ?  simethicone (MYLICON) chewable tablet 80 mg  80 mg Oral Q6H PRN Ulyses Amor, PA-C   80 mg at 10/14/21 2207  ? sodium chloride 0.9 % bolus 500 mL  500 mL Intravenous Once Farrel Gordon, DO      ? [START ON 10/21/2021] sodium thiosulfate 25 g in sodium chloride 0.9 % 200 mL Infusion for Calciphylaxis  25 g Intravenous Q M,W,F Benetta Spar D, RPH      ? zinc sulfate capsule 220 mg  220 mg Oral Daily Aldine Contes, MD   220 mg at 10/20/21 1009  ? ? ? ?Discharge Medications: ?Please see discharge summary for a list of discharge medications. ? ?Relevant Imaging Results: ? ?Relevant Lab Results: ? ? ?Additional  Information ?SSN [id number]  HD patient   [facility] on MWF. Pt arrives at 11:00 for 11:15 ? ?Chavonne Sforza Renold Don, LCSWA ? ? ? ? ?

## 2021-10-20 NOTE — Progress Notes (Signed)
[facility] ?Progress Note ? ? OP HD: MWF [place] (was [employer])    ?    4h  400/600  60.3kg  2/2.5 bath  LUA AVG  Hep none ? - hect 3 ug tiw IV (dc'd here) ? - sodium thiosulfate 25 gm tiw IV  ? - home: renvela packet 2.4gm ac tid ?   ?Assessment/ Plan: ?Wound infection of BKA stump - IV abx per pmd. SP new L AKA and revision of R AKA 3/10 by VVS.  ?Hypotension - on midodrine 10 tid, ^ to 15 tid. BP's soft. Overall this is not good w/ BP's which are not coming up. Pt is DNR. Pt is not a candidate for CRRT, pressor support etc.  Will need to get palliative care involved; poor performance status as well.  ?Volume - wt's cont to climb ?Afib RVR - per pmd ?ESRD - on HD MWF. HD today off schedule. 2L net UF 3/13 and have him on for Tuesday as well primarily for UF and then can go to th sat for this week (left leg wound is closed w staples but swollen likely from fluid). Switch to MWF next week bec of staffing. 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 Phos in range, cont binder.  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 DC'd hectorol.  ?DNR ? ? ?Subjective: seen in room (friend bedside), no new c/o, denies f/c/n/v/sob ? ?Vitals:  ? 10/19/21 2304 10/20/21 0357 10/20/21 0807 10/20/21 1034  ?BP: (!) 83/62 98/66 (!) 94/44 (!) 74/45  ?Pulse: (!) 113 (!) 112 (!) 126 (!) 116  ?Resp: 20 16 (!) 28 (!) 23  ?Temp: 97.8 ?F (36.6 ?C) 98.2 ?F (36.8 ?C) 98.2 ?F (36.8 ?C) 98.2 ?F (36.8 ?C)  ?TempSrc: Oral Oral Oral Oral  ?SpO2: 96% 93% 91% 95%  ?Weight:  65.9 kg    ?Height:      ? ? ?Exam: ?Gen alert, frail, nasal cann O2, no distress ?No jvd or bruits ?Chest clear bilat to bases ?RRR no RG ?Abd soft ntnd ?Ext bilat AKA, diffuse 2+ UE and LE stump edema ?Neuro is alert, Ox 3 , nf ?  LUA AVF+bruit (firm in aneurysm in arterial limb), clot? But strong bruit ?  ?  ?  ?  ? Home meds include - eliquis 5 bid, lipitor,  neurontin 100 bid, midodrine 10 tid, zoloft,  ?  ?  CXR - 3/7- chronic changes, mild CM, no gross edema , small bilat effusions ?     ? ? ?Recent Labs  ?Lab 10/19/21 ?1346 10/19/21 ?1347 10/20/21 ?0840  ?K  --  3.0* 3.9  ?BUN  --  7* 18  ?CREATININE  --  1.26* 2.58*  ?ALBUMIN  --  2.5* 2.1*  ?CALCIUM  --  8.1* 9.8  ?PHOS  --  2.4* 4.2  ?HGB 8.0*  --  8.1*  ? ?Inpatient medications: ? sodium chloride   Intravenous Once  ? sodium chloride   Intravenous Once  ? acetaminophen  1,000 mg Oral TID  ? vitamin C  250 mg Oral BID  ? atorvastatin  10 mg Oral QHS  ? Chlorhexidine Gluconate Cloth  6 each Topical Q0600  ? gabapentin  100 mg Oral BID  ? Gerhardt's butt cream   Topical Daily  ? midodrine  15 mg Oral QID  ? multivitamin  1 tablet Oral QHS  ? sertraline  100 mg Oral Daily  ? zinc sulfate  220 mg Oral Daily  ? ? sodium chloride    ? [START ON 10/21/2021] sodium thiosulfate infusion for calciphylaxis    ? ?HYDROmorphone, methocarbamol, ondansetron **OR** ondansetron (ZOFRAN) IV, oxyCODONE, senna-docusate, simethicone ? ? ? ? ? ? ?

## 2021-10-20 NOTE — Progress Notes (Signed)
?  Echocardiogram ?2D Echocardiogram has been performed. ? ?Scott Castro ?10/20/2021, 10:03 AM ?

## 2021-10-20 NOTE — Progress Notes (Addendum)
? LOS: 7 days  ?Patient Summary: Scott Castro is a 66 year old male with a past medical history of paroxysmal atrial fibrillation on eliquis, ESRD on HD MWF (19 years) s/p failed kidney transplant, Barrett's esophagus, and critical limb ischemia leading to bilateral BKA and R AKA, who presented with a non-healing wounds leading to revision of L BKA to AKA and R AKA to higher R AKA. ? ?Overnight Events: No acute overnight events. ? ?Interim History: Patient seen and evaluated by team at bedside. He reports feeling about the same as yesterday. He has had intermittent lightheadedness when he sits up to eat meals. He says that his swallowing has improved and that liquids and shakes have helped. He denies dyspnea, headaches, chest pain, abdominal pain, nausea, vomiting, and leg pain. He met with the palliative care team and discussed goals of care. ? ?Objective: ?Vital signs in last 24 hours: ?Vitals:  ? 10/20/21 1330 10/20/21 1400 10/20/21 1430 10/20/21 1500  ?BP: (!) 139/91 (!) _0  ?Pulse: (!) 119 (!) 138 (!) 147 (!) 160  ?Resp: _1 ?Temp:      ?TempSrc:      ?SpO2:      ?Weight:      ?Height:      ? ?SpO2: 95 % ?Weight change: 0.2 kg ? ?Intake/Output Summary (Last 24 hours) at 10/20/2021 1534 ?Last data filed at 10/20/2021 0400 ?Gross per 24 hour  ?Intake 300 ml  ?Output 1 ml  ?Net 299 ml  ?Physical Exam ?Constitutional: Malnourished male laying in bed. In no acute distress ?Cardiovascular: Tachycardic, referred sound from AV fistula heard. ?Respiratory: Normal effort, no respiratory distress on room air. ?Abdominal: Soft, non-tender, non-distended. ?Musculoskeletal: Slight edema seen in extremities, improved from yesterday. Surgical staples intact on bilateral amputation sites with no active bleeding. ?Neurological: Alert and oriented x4, no apparent focal deficits noted on exam. ?Skin: Warm and dry. Ecchymoses noted on bilateral upper extremities. ? ?Lab Results: ?Heparin induced platelet Ab  pending ?Serotonin release assay pending ?Vitamin A pending ?Vitamin C pending ?Zinc pending ? ?CBC Latest Ref Rng & Units 10/20/2021 10/19/2021 10/18/2021  ?WBC 4.0 - 10.5 K/uL 5.3 5.5 9.5  ?Hemoglobin 13.0 - 17.0 g/dL 8.1(L) 8.0(L) 8.2(L)  ?Hematocrit 39.0 - 52.0 % 23.8(L) 24.1(L) 24.7(L)  ?Platelets 150 - 400 K/uL 77(L) 71(L) 92(L)  ? ?BMP Latest Ref Rng & Units 10/20/2021 10/19/2021 10/18/2021  ?Glucose 70 - 99 mg/dL 110(H) 82 68(L)  ?BUN 8 - 23 mg/dL 18 7(L) 18  ?Creatinine 0.61 - 1.24 mg/dL 2.58(H) 1.26(H) 2.73(H)  ?Sodium 135 - 145 mmol/L 139 136 135  ?Potassium 3.5 - 5.1 mmol/L 3.9 3.0(L) 4.2  ?Chloride 98 - 111 mmol/L 98 99 98  ?CO2 22 - 32 mmol/L _2 ?Calcium 8.9 - 10.3 mg/dL 9.8 8.1(L) 9.2  ?  ?Studies/Results: ?ECHOCARDIOGRAM LIMITED ? ?Result Date: 10/20/2021 ?   ECHOCARDIOGRAM LIMITED REPORT   Patient Name:   [name] Date of Exam: 10/20/2021 Medical Rec #:  [id number]    Height:       69.0 in Accession #:    [id number]   Weight:       145.3 lb Date of Birth:  1955/12/02    BSA:          1.804 m? Patient Age:    47 years     BP:           94/44 mmHg Patient Gender: M  HR:           118 bpm. Exam Location:  Inpatient Procedure: Limited Echo, Limited Color Doppler and Cardiac Doppler Indications:    Dyspnea  History:        Patient has prior history of Echocardiogram examinations, most                 recent 08/31/2021. Arrythmias:Atrial Fibrillation.  Sonographer:    Bernadene Person RDCS Referring Phys: [id number] [place]  1. Left ventricular ejection fraction, by estimation, is 30 to 35%. The left ventricle has moderately decreased function. Left ventricular diastolic parameters are consistent with Grade II diastolic dysfunction (pseudonormalization). There is the interventricular septum is flattened in systole and diastole, consistent with right ventricular pressure and volume overload.  2. Right ventricular systolic function is severely reduced. The right ventricular size is  moderately enlarged. There is severely elevated pulmonary artery systolic pressure.  3. Left atrial size was moderately dilated.  4. Right atrial size was severely dilated.  5. Mild to moderate mitral valve regurgitation.  6. The tricuspid valve is abnormal. Tricuspid valve regurgitation is severe.  7. The aortic valve is tricuspid. There is mild calcification of the aortic valve. There is mild thickening of the aortic valve. Aortic valve regurgitation is trivial. Aortic valve sclerosis is present, with no evidence of aortic valve stenosis.  8. The inferior vena cava is dilated in size with <50% respiratory variability, suggesting right atrial pressure of 15 mmHg. Comparison(s): The left ventricular function is significantly worse. The right ventricular systolic function is worse. Diastolic function asessment assumes the rhythm is sinus tachycardia (cannot entirely exclude atrial flutter) FINDINGS  Left Ventricle: Left ventricular ejection fraction, by estimation, is 30 to 35%. The left ventricle has moderately decreased function. The interventricular septum is flattened in systole and diastole, consistent with right ventricular pressure and volume overload. Left ventricular diastolic parameters are consistent with Grade II diastolic dysfunction (pseudonormalization). Right Ventricle: The right ventricular size is moderately enlarged. Right ventricular systolic function is severely reduced. There is severely elevated pulmonary artery systolic pressure. The tricuspid regurgitant velocity is 3.79 m/s, and with an assumed right atrial pressure of 15 mmHg, the estimated right ventricular systolic pressure is 10.1 mmHg. Left Atrium: Left atrial size was moderately dilated. Right Atrium: Right atrial size was severely dilated. Mitral Valve: There is mild thickening of the mitral valve leaflet(s). There is mild calcification of the mitral valve leaflet(s). Mild mitral annular calcification. Mild to moderate mitral valve  regurgitation. Tricuspid Valve: The tricuspid valve is abnormal. Tricuspid valve regurgitation is severe. Aortic Valve: The aortic valve is tricuspid. There is mild calcification of the aortic valve. There is mild thickening of the aortic valve. Aortic valve regurgitation is trivial. Aortic valve sclerosis is present, with no evidence of aortic valve stenosis. Venous: The inferior vena cava is dilated in size with less than 50% respiratory variability, suggesting right atrial pressure of 15 mmHg. IAS/Shunts: No atrial level shunt detected by color flow Doppler. LEFT VENTRICLE PLAX 2D LVIDd:         4.60 cm      Diastology LVIDs:         4.00 cm      LV e' medial:    9.20 cm/s LV PW:         1.00 cm      LV E/e' medial:  12.4 LV IVS:        0.90 cm      LV e' lateral:  11.00 cm/s LVOT diam:     2.10 cm      LV E/e' lateral: 10.4 LV SV:         46 LV SV Index:   25 LVOT Area:     3.46 cm?  LV Volumes (MOD) LV vol d, MOD A2C: 151.0 ml LV vol d, MOD A4C: 92.4 ml LV vol s, MOD A2C: 85.4 ml LV vol s, MOD A4C: 53.9 ml LV SV MOD A2C:     65.6 ml LV SV MOD A4C:     92.4 ml LV SV MOD BP:      53.6 ml RIGHT VENTRICLE RV S prime:     8.15 cm/s TAPSE (M-mode): 0.9 cm LEFT ATRIUM         Index LA diam:    4.80 cm 2.66 cm/m?  AORTIC VALVE LVOT Vmax:   105.00 cm/s LVOT Vmean:  65.100 cm/s LVOT VTI:    0.132 m  AORTA Ao Root diam: 3.50 cm MITRAL VALVE                TRICUSPID VALVE MV Area (PHT): 6.32 cm?     TR Peak grad:   57.5 mmHg MV Decel Time: 120 msec     TR Vmax:        379.00 cm/s MV E velocity: 114.00 cm/s MV A velocity: 62.90 cm/s   SHUNTS MV E/A ratio:  1.81         Systemic VTI:  0.13 m                             Systemic Diam: 2.10 cm Sanda Klein MD Electronically signed by Sanda Klein MD Signature Date/Time: 10/20/2021/11:48:39 AM    Final     ? ?Medications: ?Scheduled Meds: ? sodium chloride   Intravenous Once  ? sodium chloride   Intravenous Once  ? acetaminophen  1,000 mg Oral TID  ? vitamin C  250 mg Oral  BID  ? atorvastatin  10 mg Oral QHS  ? Chlorhexidine Gluconate Cloth  6 each Topical Q0600  ? gabapentin  100 mg Oral BID  ? Gerhardt's butt cream   Topical Daily  ? midodrine  15 mg Oral QID  ? multivita

## 2021-10-20 NOTE — Consult Note (Addendum)
?Cardiology Consultation:  ? ?Patient ID: Scott Castro ?MRN: [id number]; DOB: 02/21/1956 ? ?Admit date: 10/13/2021 ?Date of Consult: 10/20/2021 ? ?PCP:  Franciso Bend, MD ?  ?[facility] Providers ?Cardiologist:  Dr. Saralyn Pilar @ [facility] ?Click here to update MD or APP on Care Team, Refresh:1}   ? ? ?Patient Profile:  ? 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 Tobey Bride ? ?History of Present Illness:  ? ?Mr. Whitsitt's primary cardiologist is Dr. Saralyn Pilar, though [facility] team has taken care of him recently in the hospital. This is his 6th admission in the past 12 months for various issues (PNA, Covid, sepsis, limb ischemia). He had bilateral amputations as above on 1/25 and 2/1. He also had RLL irregular consolidation with recommendation for outpatient PET/CT last admission. Our team followed him in Jan/Feb 2023 for atrial flutter/ectopic atrial tachycardia managed with rate control strategy/anticoagulation (HR 110 by last cards rounding note). Echo 08/31/21 showed EF 50% with RV dysfunction/pulm HTN. He required discharge to SNF. He was seen in [facility] office 10/13/21 at which time he was found to have L BKA wound infection and weakness with concern for tachycardia-hypotension. Initial EKG appears c/w afib/flutter. Hospital admission notable for multiple issues - hypotension, anasarca, calciphylaxis, and anemia. He was treated with IV abx and went to OR 3/10 with new L AKA and revision of R AKA. He was briefly on diltiazem. EKG 3/12 appeared to show sinus tach. Despite midodrine, his BP has remained soft and nephrology is concerned about prognosis - he is not felt to be a candidate for CRRT or pressor support  and they recommended palliative care are involved. BPs 70s-80s at times. He is now DNR. As part of his management, echo was obtained today showing EF 30-35%, grade 2 DD, interventricular septum is flattened in systole and diastole, consistent with right ventricular pressure and volume overload, severely reduced RV function, moderately enlarged RV, severe pulm HTN, moderate LAE, severe RAE, mild-moderate MR, severe TR, dilated IVC. He is seen in dialysis presently and has a flat affect. He denies any chest pain, SOB, dizziness, palpitations, or pain. Telemetry while in dialysis confirms he is back in atrial fib (over the last several days appears to have paroxysms of atrial fib, flutter, and sinus tach). Anticoagulation on hold by primary team due to worsening thrombocytopenia. ? ?Past Medical History:  ?Diagnosis Date  ? AF (paroxysmal atrial fibrillation) ([place])   ? Atrial flutter ([place])   ? Barrett's esophagus   ? Critical limb ischemia of both lower extremities ([place])   ? ESRD on hemodialysis ([place])   ? Essential hypertension   ? Glomerulosclerosis, focal   ? Hyperlipidemia   ? Hyperparathyroidism due to renal insufficiency ([place])   ? Mitral regurgitation   ? PAD (peripheral artery disease) ([place])   ? Rheumatic fever   ? S/P AKA (above knee amputation) bilateral ([place])   ? Severe tricuspid regurgitation   ? ? ?Past Surgical History:  ?Procedure Laterality Date  ? ABDOMINAL AORTOGRAM W/LOWER EXTREMITY N/A 08/28/2021  ? Procedure: ABDOMINAL AORTOGRAM W/LOWER EXTREMITY;  Surgeon: Cherre Robins, MD;  Location: [facility];  Service: Cardiovascular;  Laterality: N/A;  ? AMPUTATION Right  08/07/2021  ? Procedure: AMPUTATION BELOW KNEE;  Surgeon: Algernon Huxley, MD;  Location: [facility];  Service: Vascular;  Laterality: Right;  ? AMPUTATION Left 09/02/2021  ? Procedure: LEFT BELOW KNEE AMPUTATION;  Surgeon: Serafina Mitchell, MD;  Location: [facility] OR;  Service: Vascular;  Laterality: Left;  ? AMPUTATION Right 09/09/2021  ?  Procedure: RIGHT AMPUTATION ABOVE KNEE;  Surgeon: Cherre Robins, MD;  Location: [place] [facility];  Service: Vascular;  Laterality: Right;  ? AMPUTATION Bilateral 10/16/2021  ? Procedure: LEFT ABOVE KNEE AMPUTATION, REVISION OF RIGHT ABOVE KNEE APMPUTATION;  Surgeon: Waynetta Sandy, MD;  Location: [place];  Service: Vascular;  Laterality: Bilateral;  ? AV FISTULA PLACEMENT    ? COLONOSCOPY N/A 03/22/2016  ? Procedure: COLONOSCOPY;  Surgeon: Manya Silvas, MD;  Location: [facility] ENDOSCOPY;  Service: Endoscopy;  Laterality: N/A;  ? DG AV DIALYSIS GRAFT DECLOT OR    ? ESOPHAGOGASTRODUODENOSCOPY (EGD) WITH PROPOFOL  03/22/2016  ? Procedure: ESOPHAGOGASTRODUODENOSCOPY (EGD) WITH PROPOFOL;  Surgeon: Manya Silvas, MD;  Location: [facility] ENDOSCOPY;  Service: Endoscopy;;  ? FLEXIBLE BRONCHOSCOPY    ? KIDNEY TRANSPLANT Right 1985  ? LOWER EXTREMITY ANGIOGRAPHY Right 07/20/2021  ? Procedure: Lower Extremity Angiography;  Surgeon: Algernon Huxley, MD;  Location: [facility];  Service: Cardiovascular;  Laterality: Right;  ? LOWER EXTREMITY ANGIOGRAPHY Right 08/06/2021  ? Procedure: Lower Extremity Angiography;  Surgeon: Algernon Huxley, MD;  Location: [facility];  Service: Cardiovascular;  Laterality: Right;  ? REMOVAL TENCKHOFF CATH    ?  ? ?Home Medications:  ?Prior to Admission medications   ?Medication Sig Start Date End Date Taking? Authorizing Provider  ?acetaminophen (TYLENOL) 325 MG tablet Take 2 tablets (650 mg total) by mouth every 6 (six) hours as needed for mild pain or moderate pain. 09/16/21  Yes Hongalgi, Lenis Dickinson, MD  ?albuterol (VENTOLIN HFA) 108 (90 Base) MCG/ACT inhaler Inhale 2 puffs into the lungs every 6 (six) hours as needed for wheezing or shortness of breath. 07/08/21  Yes Enzo Bi, MD  ?apixaban (ELIQUIS) 5 MG TABS tablet Take 1 tablet (5 mg total) by mouth 2 (two) times daily. 07/22/21  Yes Sharen Hones, MD  ?atorvastatin (LIPITOR) 10 MG tablet Take 1 tablet (10 mg total) by mouth at bedtime.  07/22/21  Yes Sharen Hones, MD  ?bisacodyl (DULCOLAX) 10 MG suppository Place 1 suppository (10 mg total) rectally daily as needed for moderate constipation. 09/16/21  Yes Hongalgi, Lenis Dickinson, MD  ?diltiazem (CARDIZEM CD) 120 MG 24 hr capsule Take 1 capsule (120 mg total) by mouth daily. Hold for sbp < 100 08/12/21  Yes Kc, Maren Beach, MD  ?gabapentin (NEURONTIN) 100 MG capsule Take 1 capsule (100 mg total) by mouth 2 (two) times daily. 08/12/21  Yes Antonieta Pert, MD  ?hydrocortisone (ANUSOL-HC) 25 MG suppository Place 25 mg rectally 2 (two) times daily.   Yes [provider]  ?meropenem (MERREM) IVPB Inject 500 mg into the vein See admin instructions. Qd x 7 days   Yes [provider]  ?metoprolol tartrate (LOPRESSOR) 25 MG tablet Take 1.5 tablets (37.5 mg total) by mouth 2 (two) times daily. 09/16/21  Yes Hongalgi, Lenis Dickinson, MD  ?midodrine (PROAMATINE) 10 MG tablet Take 1 tablet (10 mg total) by mouth 3 (three) times daily with meals. 09/16/21  Yes Hongalgi, Lenis Dickinson, MD  ?multivitamin (RENA-VIT) TABS tablet Take 1 tablet by mouth at bedtime. 09/16/21  Yes Hongalgi, Lenis Dickinson, MD  ?Nutritional Supplements (FEEDING SUPPLEMENT,  NEPRO CARB STEADY,) LIQD Take 237 mLs by mouth 3 (three) times daily between meals. 09/16/21  Yes Hongalgi, Lenis Dickinson, MD  ?oxyCODONE (OXY IR/ROXICODONE) 5 MG immediate release tablet Take 1 tablet (5 mg total) by mouth every 6 (six) hours as needed for severe pain. 09/16/21  Yes Hongalgi, Lenis Dickinson, MD  ?pantoprazole (PROTONIX) 40 MG tablet Take 40 mg by mouth daily. 03/27/19  Yes [provider]  ?polyethylene glycol (MIRALAX / GLYCOLAX) 17 g packet Take 17 g by mouth daily. 09/16/21  Yes Hongalgi, Lenis Dickinson, MD  ?senna-docusate (SENOKOT-S) 8.6-50 MG tablet Take 2 tablets by mouth 2 (two) times daily. 08/12/21  Yes Antonieta Pert, MD  ?sertraline (ZOLOFT) 100 MG tablet Take 1 tablet (100 mg total) by mouth daily. 09/17/21  Yes Hongalgi, Lenis Dickinson, MD  ?sevelamer carbonate (RENVELA) 800 MG tablet Take 2400 mg  by mouth three times daily with meals, 800 mg 3 times daily as needed with snacks ?Patient taking differently: Take 1,600 mg by mouth 3 (three) times daily with meals. 09/16/21  Yes Hongalgi, Lenis Dickinson, MD  ?wi

## 2021-10-21 ENCOUNTER — Inpatient Hospital Stay (HOSPITAL_COMMUNITY): Payer: Medicare Other

## 2021-10-21 DIAGNOSIS — N186 End stage renal disease: Secondary | ICD-10-CM | POA: Diagnosis not present

## 2021-10-21 DIAGNOSIS — Z992 Dependence on renal dialysis: Secondary | ICD-10-CM | POA: Diagnosis not present

## 2021-10-21 DIAGNOSIS — I4891 Unspecified atrial fibrillation: Secondary | ICD-10-CM | POA: Diagnosis not present

## 2021-10-21 DIAGNOSIS — T8189XA Other complications of procedures, not elsewhere classified, initial encounter: Secondary | ICD-10-CM | POA: Diagnosis not present

## 2021-10-21 LAB — RENAL FUNCTION PANEL
Albumin: 2.4 g/dL — ABNORMAL LOW (ref 3.5–5.0)
Anion gap: 16 — ABNORMAL HIGH (ref 5–15)
BUN: 13 mg/dL (ref 8–23)
CO2: 24 mmol/L (ref 22–32)
Calcium: 9.8 mg/dL (ref 8.9–10.3)
Chloride: 96 mmol/L — ABNORMAL LOW (ref 98–111)
Creatinine, Ser: 1.88 mg/dL — ABNORMAL HIGH (ref 0.61–1.24)
GFR, Estimated: 39 mL/min — ABNORMAL LOW (ref >60)
Glucose, Bld: 109 mg/dL — ABNORMAL HIGH (ref 70–99)
Phosphorus: 3.4 mg/dL (ref 2.5–4.6)
Potassium: 4.1 mmol/L (ref 3.5–5.1)
Sodium: 136 mmol/L (ref 135–145)

## 2021-10-21 LAB — HEPATIC FUNCTION PANEL
ALT: 9 U/L (ref 0–44)
AST: 17 U/L (ref 15–41)
Albumin: 2.5 g/dL — ABNORMAL LOW (ref 3.5–5.0)
Alkaline Phosphatase: 110 U/L (ref 38–126)
Bilirubin, Direct: 0.5 mg/dL — ABNORMAL HIGH (ref 0.0–0.2)
Indirect Bilirubin: 0.7 mg/dL (ref 0.3–0.9)
Total Bilirubin: 1.2 mg/dL (ref 0.3–1.2)
Total Protein: 5.2 g/dL — ABNORMAL LOW (ref 6.5–8.1)

## 2021-10-21 LAB — HEPARIN INDUCED PLATELET AB (HIT ANTIBODY): Heparin Induced Plt Ab: 0.137 OD (ref 0.000–0.400)

## 2021-10-21 LAB — CBC
HCT: 24.7 % — ABNORMAL LOW (ref 39.0–52.0)
Hemoglobin: 8.2 g/dL — ABNORMAL LOW (ref 13.0–17.0)
MCH: 30.3 pg (ref 26.0–34.0)
MCHC: 33.2 g/dL (ref 30.0–36.0)
MCV: 91.1 fL (ref 80.0–100.0)
Platelets: 78 10*3/uL — ABNORMAL LOW (ref 150–400)
RBC: 2.71 MIL/uL — ABNORMAL LOW (ref 4.22–5.81)
RDW: 17.1 % — ABNORMAL HIGH (ref 11.5–15.5)
WBC: 4.9 10*3/uL (ref 4.0–10.5)
nRBC: 0 % (ref 0.0–0.2)

## 2021-10-21 LAB — PATHOLOGIST SMEAR REVIEW

## 2021-10-21 LAB — ZINC: Zinc: 61 ug/dL (ref 44–115)

## 2021-10-21 IMAGING — CT CT CHEST W/O CM
2 of 4 series · 15 of 36 positions shown, 18 images · non-contrast
Comparison: [DATE]

CLINICAL DATA: Pleural effusion.



[Series 4: thorax 2.0 · axial · 0.74mm/px · z∈[+1139,+1417]mm · 12 of 157 slices shown, 15 images]
[im 9/157  mediastinal]
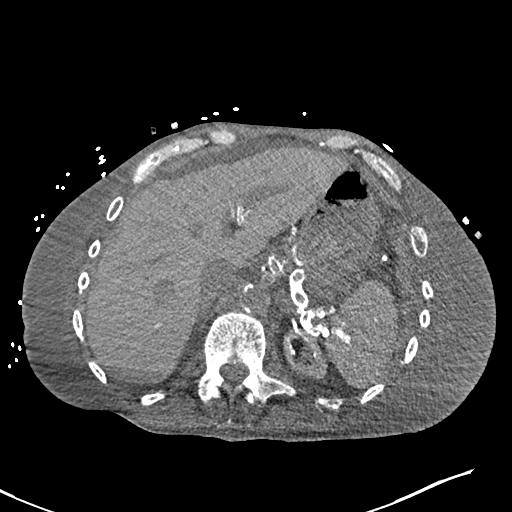
[im 9/157  lung]
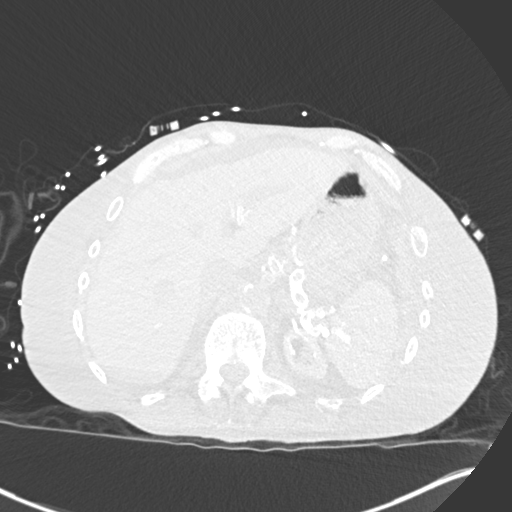
[im 25/157  lung]
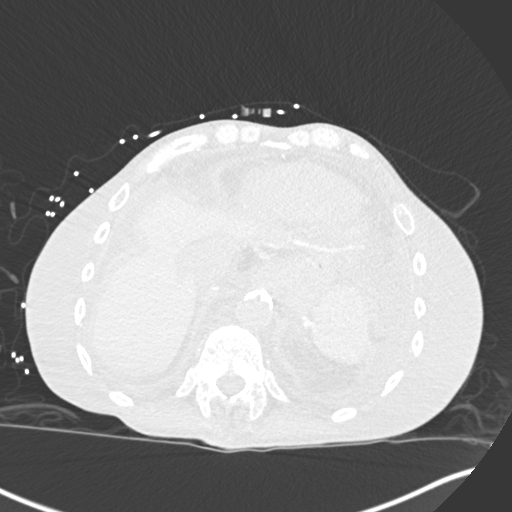
[im 33/157  lung]
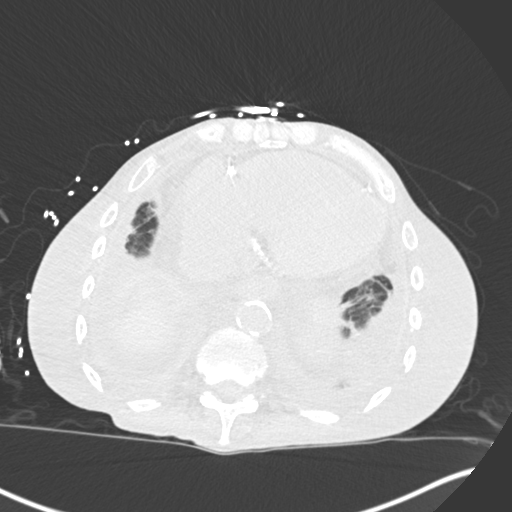
[im 50/157  lung]
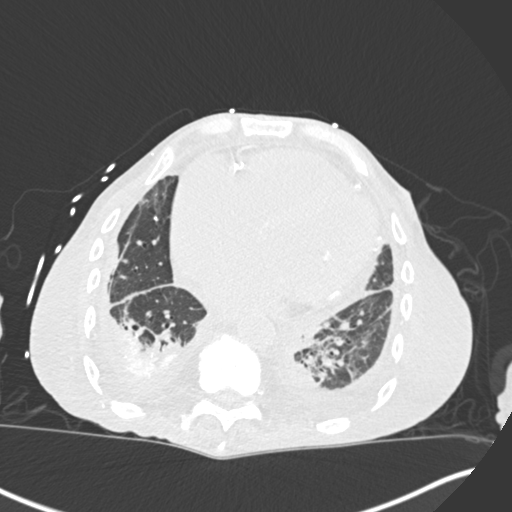
[im 58/157  mediastinal]
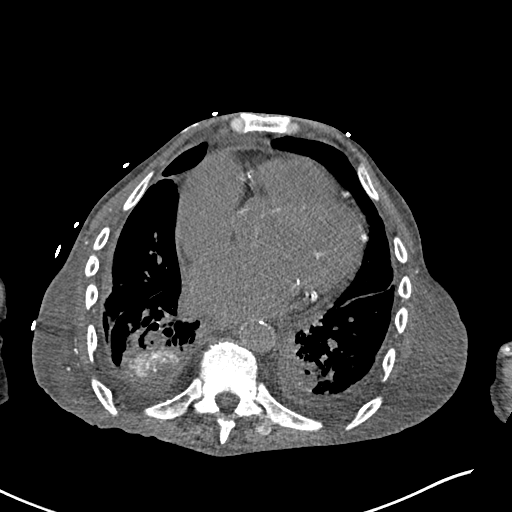
[im 58/157  lung]
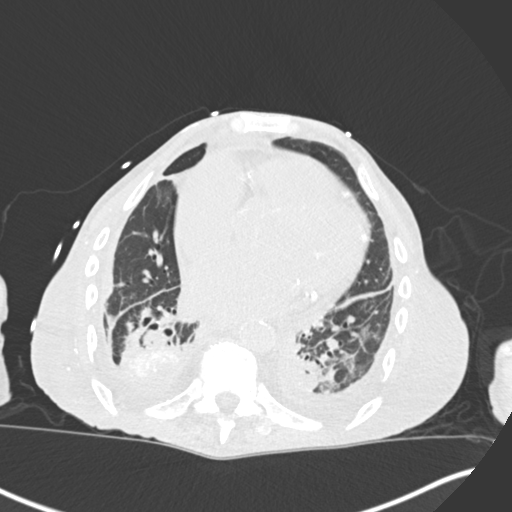
[im 74/157  lung]
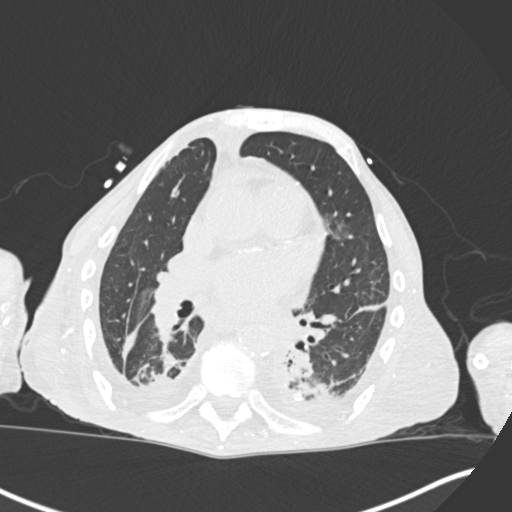
[im 83/157  lung]
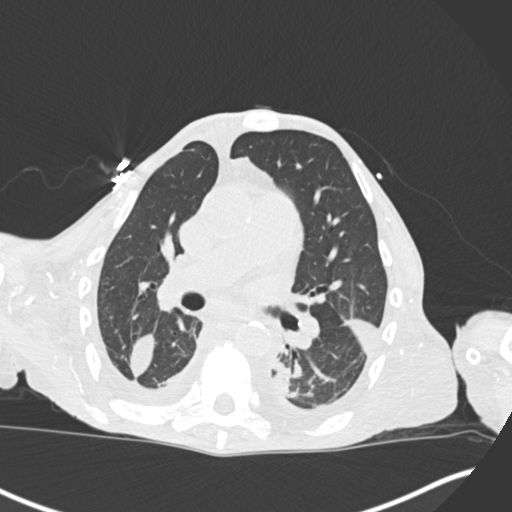
[im 99/157  lung]
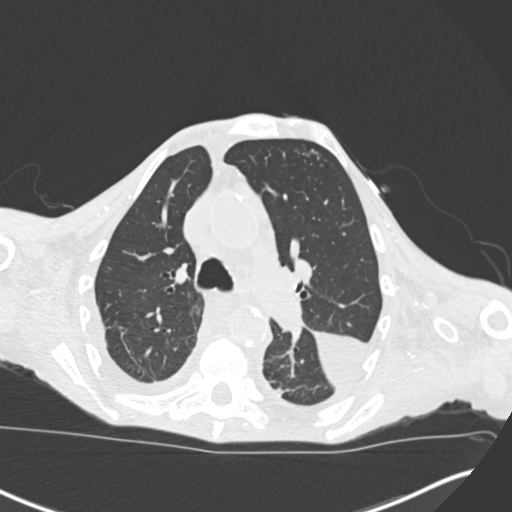
[im 107/157  mediastinal]
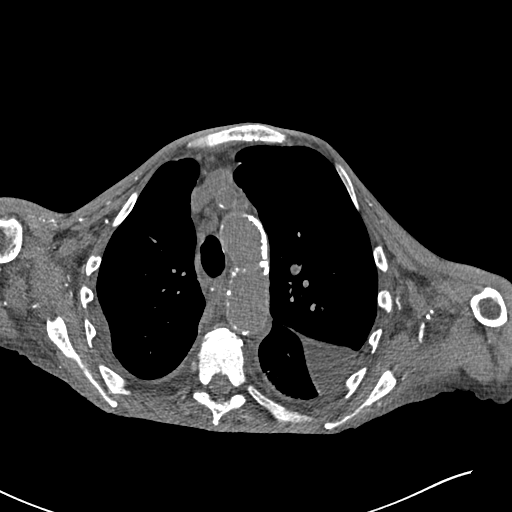
[im 107/157  lung]
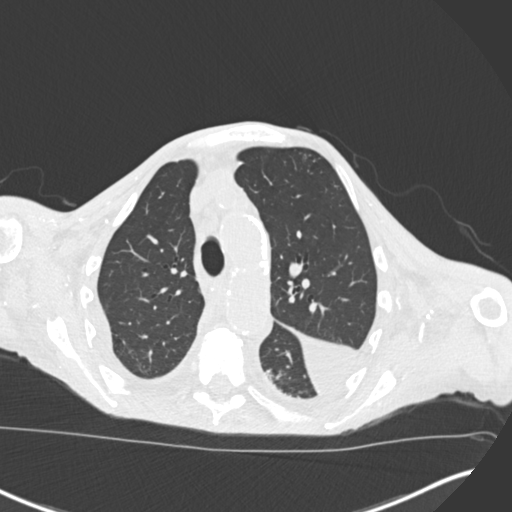
[im 124/157  lung]
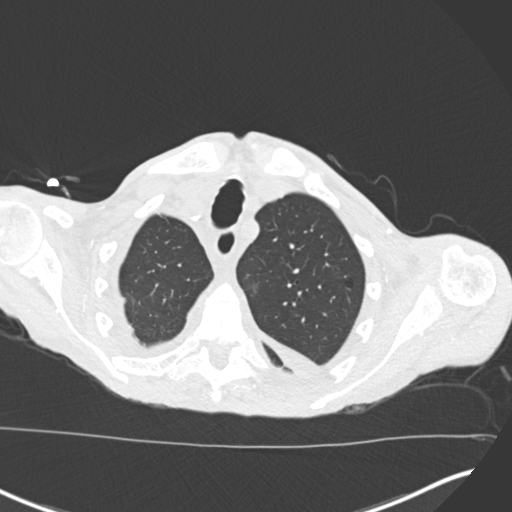
[im 132/157  lung]
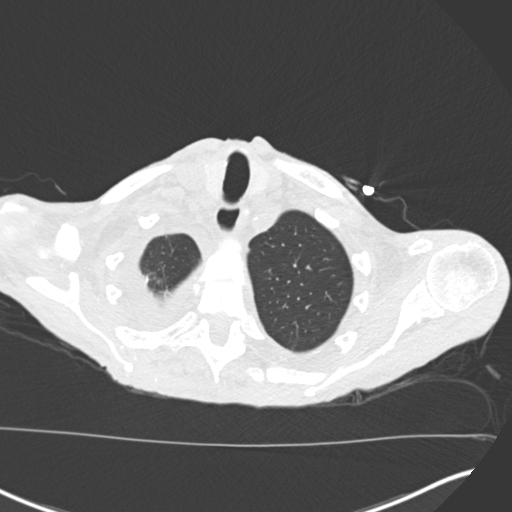
[im 148/157  lung]
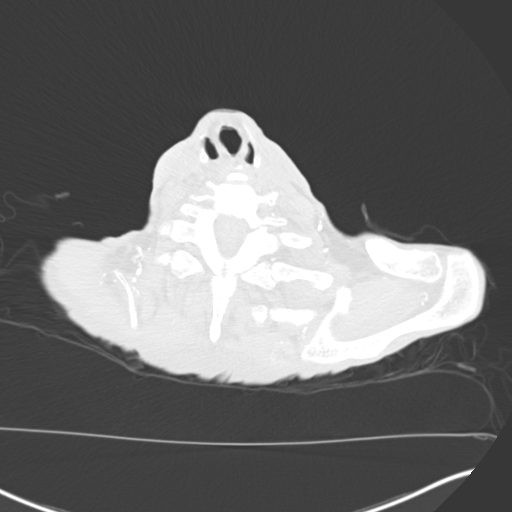

[Series 6: coronal · coronal · 0.56mm/px · 3 of 91 slices shown]
[im 19/91  lung]
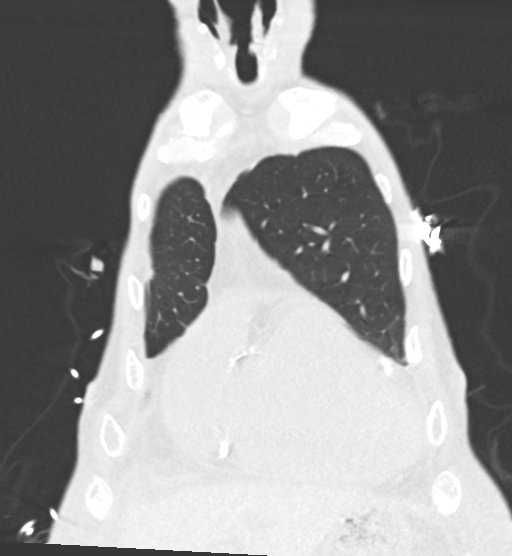
[im 37/91  lung]
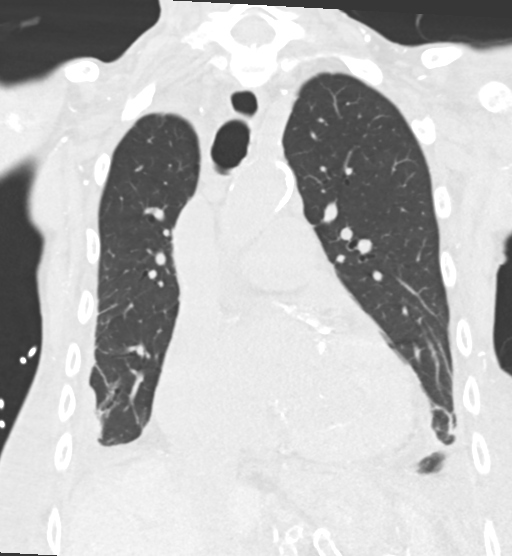
[im 55/91  lung]
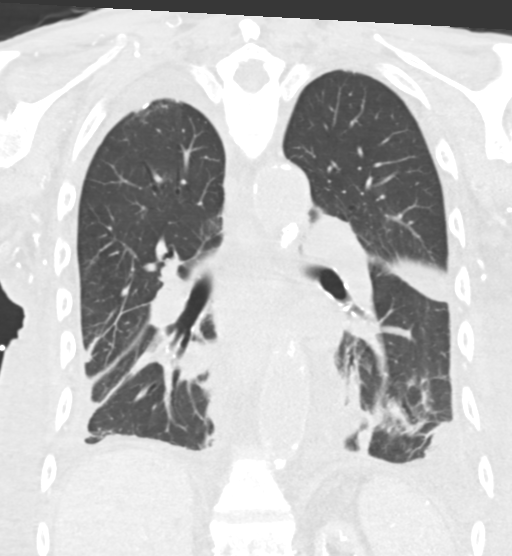

[15 of 36 positions shown; findings below may reference images not displayed]

FINDINGS: Cardiovascular: The heart is enlarged. Coronary artery calcification
is evident. Mitral annular calcification evident. Moderate
atherosclerotic calcification is noted in the wall of the thoracic
aorta.

Mediastinum/Nodes: Mild mediastinal lymphadenopathy noted with
height 12 mm short axis right paratracheal node in an 18 mm short
axis precarinal node, both similar to prior. Other scattered upper
normal mediastinal lymph nodes evident. No evidence for gross hilar
lymphadenopathy although assessment is limited by the lack of
intravenous contrast on the current study. The esophagus has normal
imaging features. There is no axillary lymphadenopathy.

Lungs/Pleura: Fluid again noted loculated in the left major fissure,
similar to prior. Trace fluid in the right major fissure again noted
with tiny layering right pleural effusion. Volume loss and
architectural distortion again noted both lower lungs with probable
rounded atelectasis in the right lower lobe. The patchy and nodular
ground-glass and consolidative opacity seen in the posterior lower
right lung on the previous study has decreased in the interval.

There is new patchy ground-glass attenuation in the left upper lobe
on today's study (92/8).

Upper Abdomen: Prominent mural calcification in dominant arterial
anatomy of the upper abdomen.

Musculoskeletal: No worrisome lytic or sclerotic osseous
abnormality. Diffuse body wall edema evident.
IMPRESSION: 1. Interval decrease in the patchy and nodular ground-glass and
consolidative opacity seen in the posterior lower right lung on the
previous study.
2. Interval development of patchy ground-glass attenuation in the
left upper lobe. Imaging features suggest infectious/inflammatory
etiology.
3. Similar architectural distortion and dense consolidative changes
posterior right lower lobe with appearance suggesting rounded
atelectasis. Interval stability is reassuring but continued close
follow-up recommended.
4. Stable appearance of loculated fluid in the left major fissure
with tiny layering right pleural effusion.
5. Diffuse body wall edema.
6. Aortic Atherosclerosis ([4E]-[4E]).

## 2021-10-21 MED ORDER — SODIUM THIOSULFATE 250 MG/ML IV SOLN
25.0000 g | INTRAVENOUS | Status: DC
  Administered 2021-10-23: 25 g via INTRAVENOUS
  Filled 2021-10-21 (×2): qty 100

## 2021-10-21 MED ORDER — DARBEPOETIN ALFA 150 MCG/0.3ML IJ SOSY
150.0000 ug | PREFILLED_SYRINGE | INTRAMUSCULAR | Status: DC
  Administered 2021-10-23: 150 ug via INTRAVENOUS
  Filled 2021-10-21: qty 0.3

## 2021-10-21 NOTE — Progress Notes (Addendum)
Speech Language Pathology Treatment: Dysphagia  ?Patient Details ?Name: Scott Castro ?MRN: [id number] ?DOB: 1956/05/04 ?Today's Date: 10/21/2021 ?Time: 7342-8768 ?SLP Time Calculation (min) (ACUTE ONLY): 15 min ? ?Assessment / Plan / Recommendation ?Clinical Impression ? Pt reported increased difficulty swallowing due to "dry food" and wanted to downgrade to Dys 3 (chopped meats) after options discussed. RN stated difficulty taking pill earlier, larger Tylenol and pt does not prefer crushed. SLP facilitated using teaspoon sip nectar juice with pills this afternoon with RN that were smaller consumed without s/sx aspiration. Cup and straw sips nectar thick appeared timely and safe. He did hesitate with swallow intermittently, holding in oral cavity before propelling on last nectar sip.  ? Recommend continue with nectar thick- thin not trialed and downgrade to Dys 3 texture. Allow ice chip after oral care. Continue ST.  ?HPI HPI: Scott Castro is a 66 yo male presenting from [facility] on 10/13/21 after being seen in the office by vascular surgery for a non-healing wound on recent left surgical site (hx critical limb ischemia and bilateral BKA and R AKA). 3/10 underwent revision of left below-knee amputation with conversion to above-knee amputation and  revision of right above-knee amputation to higher above-knee amputation.  PMH includes ESRD on dialysis (19 years), hypertension, hyperlipidemia, PAF, depression, and chronic respiratory failure 2 L of oxygen, Barrett's esophagus. Admitted for left BKA conversion to an AKA. Pt known to SLP from last admission in January, during which swallowing was evaluated due to complaints of pill dysphagia. Last EGD was August 2017 with confirmed findings of Barrett's esophagus. ?  ?   ?SLP Plan ? Continue with current plan of care ? ?  ?  ?Recommendations for follow up therapy are one component of a multi-disciplinary discharge planning process, led by the attending physician.   Recommendations may be updated based on patient status, additional functional criteria and insurance authorization. ?  ? ?Recommendations  ?Diet recommendations: Dysphagia 3 (mechanical soft);Nectar-thick liquid ?Liquids provided via: Cup;Straw ?Medication Administration: Other (Comment) (pills whole in tsp nectar or pudding) ?Supervision: Patient able to self feed ?Compensations: Slow rate;Small sips/bites;Multiple dry swallows after each bite/sip ?Postural Changes and/or Swallow Maneuvers: Seated upright 90 degrees;Upright 30-60 min after meal  ?   ?    ?   ? ? ? ? Oral Care Recommendations: Oral care BID ?Follow Up Recommendations: Skilled nursing-short term rehab (<3 hours/day) ?Assistance recommended at discharge: Intermittent Supervision/Assistance ?SLP Visit Diagnosis: Dysphagia, pharyngeal phase (R13.13) ?Plan: Continue with current plan of care ? ? ? ? ?  ?  ? ? ?Scott Castro ? ?10/21/2021, 1:44 PM ?

## 2021-10-21 NOTE — Progress Notes (Addendum)
? LOS: 8 days  ?Patient Summary: Scott Castro is a 66 year old male with a past medical history of paroxysmal atrial fibrillation on eliquis, ESRD on HD MWF (19 years) s/p failed kidney transplant, Barrett's esophagus, and critical limb ischemia leading to bilateral BKA and R AKA, who presented with a non-healing wounds leading to revision of L BKA to AKA and R AKA to higher R AKA. ? ?Overnight Events: No acute overnight events. ? ?Interim History: Patient seen and evaluated by team at bedside. He says that he is doing alright and feeling about the same as yesterday. He reports some continued dry mouth that improves after drinking water. He has continued intermittent lightheadedness and needs to catch his breath when he exerts himself while eating. He denies headaches, palpitations, chest pain, abdominal pain, and leg pain. ? ?Objective: ?Vital signs in last 24 hours: ?Vitals:  ? 10/21/21 0100 10/21/21 0200 10/21/21 0300 10/21/21 0546  ?BP: (!) 119/53 (!) 112/92 101/75   ?Pulse:   (!) 133   ?Resp:   19   ?Temp:   98.6 ?F (37 ?C)   ?TempSrc:   Oral   ?SpO2:   97%   ?Weight:    66.5 kg  ?Height:      ? ?SpO2: 97 % ?Weight change: 3.3 kg ? ?Intake/Output Summary (Last 24 hours) at 10/21/2021 0615 ?Last data filed at 10/21/2021 0300 ?Gross per 24 hour  ?Intake 305.35 ml  ?Output 1538 ml  ?Net -1232.65 ml  ? ?Physical Exam  ?Constitutional: Malnourished male laying in bed. In no acute distress ?Cardiovascular: Tachycardic, irregular rhythm. Referred sound from AV fistula heard. ?Respiratory: Normal effort, no respiratory distress on room air. ?Abdominal: Soft, non-tender, non-distended. ?Musculoskeletal: Slight edema seen in lower extremities. Bilateral lower extremities covered with clean, dry, dressings.  ?GU: scrotal edema with no redness. Tender to palpation. ?Neurological: Alert and oriented x4, no apparent focal deficits noted on exam. ?Skin: Warm and dry. Ecchymoses noted on bilateral upper extremities. ? ?Lab  Results: ?Heparin induced platelet Ab pending ?Serotonin release assay pending ?Vitamin A pending ?Vitamin C pending ?Zinc pending ? ?CBC Latest Ref Rng & Units 10/21/2021 10/20/2021 10/19/2021  ?WBC 4.0 - 10.5 K/uL 4.9 5.3 5.5  ?Hemoglobin 13.0 - 17.0 g/dL 8.2(L) 8.1(L) 8.0(L)  ?Hematocrit 39.0 - 52.0 % 24.7(L) 23.8(L) 24.1(L)  ?Platelets 150 - 400 K/uL 78(L) 77(L) 71(L)  ? ?CMP Latest Ref Rng & Units 10/21/2021 10/20/2021 10/19/2021  ?Glucose 70 - 99 mg/dL 109(H) 110(H) 82  ?BUN 8 - 23 mg/dL 13 18 7(L)  ?Creatinine 0.61 - 1.24 mg/dL 1.88(H) 2.58(H) 1.26(H)  ?Sodium 135 - 145 mmol/L 136 139 136  ?Potassium 3.5 - 5.1 mmol/L 4.1 3.9 3.0(L)  ?Chloride 98 - 111 mmol/L 96(L) 98 99  ?CO2 22 - 32 mmol/L '24 23 28  '$ ?Calcium 8.9 - 10.3 mg/dL 9.8 9.8 8.1(L)  ?Total Protein 6.5 - 8.1 g/dL 5.2(L) - -  ?Total Bilirubin 0.3 - 1.2 mg/dL 1.2 - -  ?Alkaline Phos 38 - 126 U/L 110 - -  ?AST 15 - 41 U/L 17 - -  ?ALT 0 - 44 U/L 9 - -  ?  ?Studies/Results: ? ?EKG: Atrial fibrillation with RVR, rate 134 BPM. RBBB.  ? ?DG CHEST PORT 1 VIEW ? ?Result Date: 10/20/2021 ?CLINICAL DATA:  Encounter for dyspnea. EXAM: PORTABLE CHEST 1 VIEW COMPARISON:  10/13/2021 FINDINGS: Single frontal view of the chest. Cardiac silhouette is again moderately enlarged. Moderate calcification within aortic arch. There are small bilateral pleural effusions,  unchanged from prior with tracking up the bilateral lateral pleura. Heterogeneous airspace opacity within the left mid lung and right lower lung, similar to prior. No pneumothorax. There is lucency that may represent a skin fold overlying the mid left lung. Mild dextrocurvature of the midthoracic spine. IMPRESSION:: IMPRESSION: 1. No significant change from prior. 2. Small bilateral pleural effusions. 3. Left midlung and right basilar heterogeneous airspace opacities suspicious for infection. Electronically Signed   By: Yvonne Kendall M.D.   On: 10/20/2021 21:55  ? ?Medications: ?Scheduled Meds: ? sodium chloride    Intravenous Once  ? sodium chloride   Intravenous Once  ? acetaminophen  1,000 mg Oral TID  ? vitamin C  250 mg Oral BID  ? atorvastatin  10 mg Oral QHS  ? Chlorhexidine Gluconate Cloth  6 each Topical Q0600  ? gabapentin  100 mg Oral BID  ? Gerhardt's butt cream   Topical Daily  ? midodrine  15 mg Oral QID  ? multivitamin  1 tablet Oral QHS  ? sertraline  100 mg Oral Daily  ? zinc sulfate  220 mg Oral Daily  ? ?Continuous Infusions: ? amiodarone 30 mg/hr (10/21/21 0556)  ? sodium chloride    ? sodium thiosulfate infusion for calciphylaxis    ? ?PRN Meds:.HYDROmorphone, methocarbamol, ondansetron **OR** ondansetron (ZOFRAN) IV, oxyCODONE, senna-docusate, simethicone ?Assessment/Plan: ?Principal Problem: ?  Nonhealing surgical wound ?Active Problems: ?  ESRD on hemodialysis ([facility]) ? ?#Paroxysmal Atrial Fibrillation w/ RVR ?#LV dysfunction EF 30-35% with biventricular HF and severe pulmonary HTN ?#Cardiomyopathy ?On exam, patient remains tachycardic reverting to afib/flutter from sinus tachycardia. Heparin discontinued due to concern for possible heparin induced thrombocytopenia as well as bleeding from surgical site. CBC showed thrombocytopenia today. We will continue to trend CBC and monitor for improvement. Will restart Eliquis before discharge. Not a candidate for digoxin given ESRD. Not a candidate for CCB given new cardiomyopathy and unable to tolerate beta blocker given borderline BP. Started on amiodarone despite initial lung findings given overall goals of care and palliation. Will continue to hold anticoagulation given thrombocytopenia, but will need to balance risk of stroke and bleeding. Newly recognized cardiomyopathy with decreased LV function with EF 30-35%. Tachycardia may be contributing.  ?- Cardiology following, appreciate their assistance ? - amiodarone gtt, transition to '400mg'$  PO BID for one week then '200mg'$  daily when stable.  Patient heart rate remains uncontrolled.  We will continue to monitor  closely. ?- Holding anticoagulation due to thrombocytopenia ?- Palliative care following.  Patient blood pressures remain borderline and he is not a candidate for CRRT or pressor support.  Patient with overall poor prognosis given multiple co-morbidities and worsening cardiac status ? ?#Abnormal chest CT ?History of abnormal chest CT w/o contrast on 08/31/21 which noted patchy nodularity and ground glass appearance in the RUL and BLL; masslike appearance in the RLL. Given CXR on 3/14 showing unchanged pleural effusions and heterogenous airspace opacity, will repeat CT to monitor for changes compared to prior CT ?- CT chest w/o contrast ? ?#ESRD on HD MWF with anuria ?#Secondary hyperparathyroidism ?#Hypotension ?#Anasarca ?Nephrology planning for Thursday and Saturday HD this week with return to MWF schedule next week. Patient's diffuse edema has improved compared to yesterday. Blood pressure is measured using cuff on right forearm due to midline on right upper arm and AV fistula on left upper arm, so may be inaccurate. ?- Nephrology consulted, appreciate their assistance with HD ?- Trend renal function panel ?- Avoid nephrotoxic medications ?- Midodrine to '15mg'$   QID ? ?#Scrotal edema ?Scrotal edema unchanged from before. Patient not complaining of pain. Scrotum is soft and patient has minimal tenderness with palpation. Non erythematous, and not showing any other signs of infection. Edema is likely due to dependent positioning and recent volume overload. ?- Scrotal elevation ? ?#L AKA 2/2 non-healing wound on L BKA ?#Revision of R AKA ?POD #5 from revision of L BKA to AKA and revision of R AKA to higher R AKA. Per vascular surgery, incisions are healing nicely. He remains afebrile without leukocytosis. Hgb stable at 8.2; 8.1 yesterday. ?-VVS following, appreciate their assistance ? - Dressing changes as needed ?- Trend CBC and fever curve ? ?Diet: Renal with fluid restriction ?VTE: none ?IVF: none ?Code: DNR ? ?Prior  to Admission Living Arrangement: SNF ?Anticipated Discharge Location: SNF ?Barriers to Discharge: medical stability ?Dispo: Anticipated discharge in approximately 4-5 day(s).  ? ?This is a Careers information officer Not

## 2021-10-21 NOTE — Progress Notes (Signed)
This chaplain responded to PMT consult for spiritual care. The Pt. is familiar to the chaplain from the Pt. previous hospital admission. ? ?The Pt. is awake requesting something to drink before the visit. The chaplain is appreciative RN-Kristy assistance in obtaining thickened water.  ? ?The chaplain listened as the Pt. reflected on his rehab at [facility] and his readmission to the hospital. The Pt. shared with the chaplain this admission is connected to his heart condition and PMT NP-Mary talked to him about "EOL". The chaplain and Pt. will continue the conversation on what has changed in the last "three weeks" when the chaplain returns. ? ?Chaplain Sallyanne Kuster ?[phone] ? ?

## 2021-10-21 NOTE — Progress Notes (Signed)
[facility] ?Progress Note ? ? OP HD: MWF [place] (was [employer])    ?    4h  400/600  60.3kg  2/2.5 bath  LUA AVG  Hep none ? - hect 3 ug tiw IV (dc'd here) ? - sodium thiosulfate 25 gm tiw IV  ? - home: renvela packet 2.4gm ac tid ?   ?Assessment/ Plan: ?Wound infection of BKA stump - IV abx per pmd. SP new L AKA and revision of R AKA 3/10 by VVS.  ?Hypotension - on midodrine 10 tid, ^ to 15 tid. BP's soft. Overall this is not good w/ BP's which are not coming up. Pt is DNR. Pt is not a candidate for CRRT, pressor support etc.  Appreciate palliative care seeing patient (they are very familiar with him from past hospitalizations as well); poor performance status as well.  ?Volume - certainly is in positive Na balance; tolerating some UF. ?Afib RVR - per pmd and cards -> on amiodarone; options limited. ?ESRD - usually on HD MWF. HD 3/14 with net 1.5L. ? ?Next HD Thursday and will keep on TTS regimen for now because of staffing. ? ?Anemia ckd - Hb 8.2 -> will dose aranesp tomorrow ?MBD ckd - Ca++ high and hx of calciphylaxis, hold any Ca/ vit D products. Phos in range, cont binder.  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 DC'd hectorol.  ?DNR ? ? ?Subjective: seen in room w/ palliative; no new c/o, denies f/c/n/v/sob ? ?Vitals:  ? 10/21/21 0300 10/21/21 0546 10/21/21 0700 10/21/21 1055  ?BP: 101/75  (!) 106/21 106/78  ?Pulse: (!) 133  (!) 131 (!) 137  ?Resp: '19  20 20  '$ ?Temp: 98.6 ?F (37 ?C)  98.7 ?F (37.1 ?C) 98.7 ?F (37.1 ?C)  ?TempSrc: Oral  Oral Oral  ?SpO2: 97%  98% 97%  ?Weight:  66.5 kg    ?Height:      ? ? ?Exam: ?Gen alert, frail, nasal cann O2, no distress ?No jvd or bruits ?Chest clear bilat to bases ?RRR no RG ?Abd soft ntnd ?Ext bilat AKA, diffuse 1-2+ UE and LE stump edema + scrotal edema ?Neuro is alert, Ox 3 , nf ?  LUA AVF+bruit (firm in aneurysm in arterial limb), clot? But strong bruit ?  ?  ?  ?  ? Home meds include - eliquis 5 bid, lipitor,  neurontin 100 bid, midodrine 10 tid, zoloft,  ?  ?  CXR - 3/7- chronic changes, mild CM, no gross edema , small bilat effusions ?     ? ? ?Recent Labs  ?Lab 10/20/21 ?0840 10/21/21 ?0104  ?K 3.9 4.1  ?BUN 18 13  ?CREATININE 2.58* 1.88*  ?ALBUMIN 2.1* 2.4*  2.5*  ?CALCIUM 9.8 9.8  ?PHOS 4.2 3.4  ?HGB 8.1* 8.2*  ? ?Inpatient medications: ? sodium chloride   Intravenous Once  ? sodium chloride   Intravenous Once  ? acetaminophen  1,000 mg Oral TID  ? vitamin C  250 mg Oral BID  ? atorvastatin  10 mg Oral QHS  ? Chlorhexidine Gluconate Cloth  6 each Topical Q0600  ? gabapentin  100 mg Oral BID  ? Gerhardt's butt cream   Topical Daily  ? midodrine  15 mg Oral QID  ? multivitamin  1 tablet Oral QHS  ? sertraline  100 mg Oral Daily  ? zinc sulfate  220 mg Oral Daily  ? ? amiodarone 30 mg/hr (10/21/21 0556)  ?  sodium chloride    ? sodium thiosulfate infusion for calciphylaxis    ? ?HYDROmorphone, methocarbamol, ondansetron **OR** ondansetron (ZOFRAN) IV, oxyCODONE, senna-docusate, simethicone ? ? ? ? ? ? ?

## 2021-10-21 NOTE — Progress Notes (Signed)
? ?Progress Note ? ?Patient Name: Scott Castro ?Date of Encounter: 10/21/2021 ? ?[facility] Cardiologist: [name], MD  ? ?Subjective  ? ?No complains.  ? ?Inpatient Medications  ?  ?Scheduled Meds: ? sodium chloride   Intravenous Once  ? sodium chloride   Intravenous Once  ? acetaminophen  1,000 mg Oral TID  ? vitamin C  250 mg Oral BID  ? atorvastatin  10 mg Oral QHS  ? Chlorhexidine Gluconate Cloth  6 each Topical Q0600  ? gabapentin  100 mg Oral BID  ? Gerhardt's butt cream   Topical Daily  ? midodrine  15 mg Oral QID  ? multivitamin  1 tablet Oral QHS  ? sertraline  100 mg Oral Daily  ? zinc sulfate  220 mg Oral Daily  ? ?Continuous Infusions: ? amiodarone 30 mg/hr (10/21/21 0556)  ? sodium chloride    ? sodium thiosulfate infusion for calciphylaxis    ? ?PRN Meds: ?HYDROmorphone, methocarbamol, ondansetron **OR** ondansetron (ZOFRAN) IV, oxyCODONE, senna-docusate, simethicone  ? ?Vital Signs  ?  ?Vitals:  ? 10/21/21 0200 10/21/21 0300 10/21/21 0546 10/21/21 0700  ?BP: (!) 112/92 101/75  (!) 106/21  ?Pulse:  (!) 133  (!) 131  ?Resp:  19  20  ?Temp:  98.6 ?F (37 ?C)  98.7 ?F (37.1 ?C)  ?TempSrc:  Oral  Oral  ?SpO2:  97%  98%  ?Weight:   66.5 kg   ?Height:      ? ? ?Intake/Output Summary (Last 24 hours) at 10/21/2021 0920 ?Last data filed at 10/21/2021 0300 ?Gross per 24 hour  ?Intake 305.35 ml  ?Output 1538 ml  ?Net -1232.65 ml  ? ?Last 3 Weights 10/21/2021 10/20/2021 10/20/2021  ?Weight (lbs) 146 lb 9.7 oz 151 lb 10.8 oz 154 lb 15.7 oz  ?Weight (kg) 66.5 kg 68.8 kg 70.3 kg  ?   ? ?Telemetry  ?  ?Atrial fibrillation in 130s - Personally Reviewed ? ?ECG  ?  ?N/A ? ?Physical Exam  ? ?GEN: No acute distress.   ?Neck: No JVD ?Cardiac: Ir IR tachycardia, no murmurs, rubs, or gallops.  ?Respiratory: Clear to auscultation bilaterally. ?GI: Soft, nontender, non-distended  ?MS: No edema; s/p L BKA & R AKA 09/09/21 ?Neuro:  Nonfocal  ?Psych: Normal affect  ? ?Labs  ? ?Chemistry ?Recent Labs  ?Lab 10/18/21 ?0440  10/19/21 ?1347 10/20/21 ?0840 10/21/21 ?0104  ?NA 135 136 139 136  ?K 4.2 3.0* 3.9 4.1  ?CL 98 99 98 96*  ?CO2 '25 28 23 24  '$ ?GLUCOSE 68* 82 110* 109*  ?BUN 18 7* 18 13  ?CREATININE 2.73* 1.26* 2.58* 1.88*  ?CALCIUM 9.2 8.1* 9.8 9.8  ?MG 1.7  --   --   --   ?PROT  --   --   --  5.2*  ?ALBUMIN <1.5* 2.5* 2.1* 2.4*  2.5*  ?AST  --   --   --  17  ?ALT  --   --   --  9  ?ALKPHOS  --   --   --  110  ?BILITOT  --   --   --  1.2  ?GFRNONAA 25* >60 27* 39*  ?ANIONGAP 12 9 18* 16*  ?  ? ?Hematology ?Recent Labs  ?Lab 10/19/21 ?1346 10/20/21 ?0840 10/21/21 ?0104  ?WBC 5.5 5.3 4.9  ?RBC 2.60* 2.62* 2.71*  ?HGB 8.0* 8.1* 8.2*  ?HCT 24.1* 23.8* 24.7*  ?MCV 92.7 90.8 91.1  ?MCH 30.8 30.9 30.3  ?MCHC 33.2 34.0 33.2  ?  RDW 17.2* 16.9* 17.1*  ?PLT 71* 77* 78*  ? ? ?Radiology  ?  ?DG CHEST PORT 1 VIEW ? ?Result Date: 10/20/2021 ?CLINICAL DATA:  Encounter for dyspnea. EXAM: PORTABLE CHEST 1 VIEW COMPARISON:  10/13/2021 FINDINGS: Single frontal view of the chest. Cardiac silhouette is again moderately enlarged. Moderate calcification within aortic arch. There are small bilateral pleural effusions, unchanged from prior with tracking up the bilateral lateral pleura. Heterogeneous airspace opacity within the left mid lung and right lower lung, similar to prior. No pneumothorax. There is lucency that may represent a skin fold overlying the mid left lung. Mild dextrocurvature of the midthoracic spine. IMPRESSION:: IMPRESSION: 1. No significant change from prior. 2. Small bilateral pleural effusions. 3. Left midlung and right basilar heterogeneous airspace opacities suspicious for infection. Electronically Signed   By: Yvonne Kendall M.D.   On: 10/20/2021 21:55  ? ?ECHOCARDIOGRAM LIMITED ? ?Result Date: 10/20/2021 ?   ECHOCARDIOGRAM LIMITED REPORT   Patient Name:   Scott Castro Date of Exam: 10/20/2021 Medical Rec #:  [id number]    Height:       69.0 in Accession #:    [id number]   Weight:       145.3 lb Date of Birth:  12-24-1955    BSA:           1.804 m? Patient Age:    66 years     BP:           94/44 mmHg Patient Gender: M            HR:           118 bpm. Exam Location:  Inpatient Procedure: Limited Echo, Limited Color Doppler and Cardiac Doppler Indications:    Dyspnea  History:        Patient has prior history of Echocardiogram examinations, most                 recent 08/31/2021. Arrythmias:Atrial Fibrillation.  Sonographer:    Bernadene Person RDCS Referring Phys: [id number] [place]  1. Left ventricular ejection fraction, by estimation, is 30 to 35%. The left ventricle has moderately decreased function. Left ventricular diastolic parameters are consistent with Grade II diastolic dysfunction (pseudonormalization). There is the interventricular septum is flattened in systole and diastole, consistent with right ventricular pressure and volume overload.  2. Right ventricular systolic function is severely reduced. The right ventricular size is moderately enlarged. There is severely elevated pulmonary artery systolic pressure.  3. Left atrial size was moderately dilated.  4. Right atrial size was severely dilated.  5. Mild to moderate mitral valve regurgitation.  6. The tricuspid valve is abnormal. Tricuspid valve regurgitation is severe.  7. The aortic valve is tricuspid. There is mild calcification of the aortic valve. There is mild thickening of the aortic valve. Aortic valve regurgitation is trivial. Aortic valve sclerosis is present, with no evidence of aortic valve stenosis.  8. The inferior vena cava is dilated in size with <50% respiratory variability, suggesting right atrial pressure of 15 mmHg. Comparison(s): The left ventricular function is significantly worse. The right ventricular systolic function is worse. Diastolic function asessment assumes the rhythm is sinus tachycardia (cannot entirely exclude atrial flutter) FINDINGS  Left Ventricle: Left ventricular ejection fraction, by estimation, is 30 to 35%. The left ventricle has  moderately decreased function. The interventricular septum is flattened in systole and diastole, consistent with right ventricular pressure and volume overload. Left ventricular diastolic parameters are consistent with Grade II diastolic  dysfunction (pseudonormalization). Right Ventricle: The right ventricular size is moderately enlarged. Right ventricular systolic function is severely reduced. There is severely elevated pulmonary artery systolic pressure. The tricuspid regurgitant velocity is 3.79 m/s, and with an assumed right atrial pressure of 15 mmHg, the estimated right ventricular systolic pressure is 28.7 mmHg. Left Atrium: Left atrial size was moderately dilated. Right Atrium: Right atrial size was severely dilated. Mitral Valve: There is mild thickening of the mitral valve leaflet(s). There is mild calcification of the mitral valve leaflet(s). Mild mitral annular calcification. Mild to moderate mitral valve regurgitation. Tricuspid Valve: The tricuspid valve is abnormal. Tricuspid valve regurgitation is severe. Aortic Valve: The aortic valve is tricuspid. There is mild calcification of the aortic valve. There is mild thickening of the aortic valve. Aortic valve regurgitation is trivial. Aortic valve sclerosis is present, with no evidence of aortic valve stenosis. Venous: The inferior vena cava is dilated in size with less than 50% respiratory variability, suggesting right atrial pressure of 15 mmHg. IAS/Shunts: No atrial level shunt detected by color flow Doppler. LEFT VENTRICLE PLAX 2D LVIDd:         4.60 cm      Diastology LVIDs:         4.00 cm      LV e' medial:    9.20 cm/s LV PW:         1.00 cm      LV E/e' medial:  12.4 LV IVS:        0.90 cm      LV e' lateral:   11.00 cm/s LVOT diam:     2.10 cm      LV E/e' lateral: 10.4 LV SV:         46 LV SV Index:   25 LVOT Area:     3.46 cm?  LV Volumes (MOD) LV vol d, MOD A2C: 151.0 ml LV vol d, MOD A4C: 92.4 ml LV vol s, MOD A2C: 85.4 ml LV vol s, MOD  A4C: 53.9 ml LV SV MOD A2C:     65.6 ml LV SV MOD A4C:     92.4 ml LV SV MOD BP:      53.6 ml RIGHT VENTRICLE RV S prime:     8.15 cm/s TAPSE (M-mode): 0.9 cm LEFT ATRIUM         Index LA diam:    4.80 cm 2

## 2021-10-22 DIAGNOSIS — N186 End stage renal disease: Secondary | ICD-10-CM | POA: Diagnosis not present

## 2021-10-22 DIAGNOSIS — I4891 Unspecified atrial fibrillation: Secondary | ICD-10-CM | POA: Diagnosis not present

## 2021-10-22 DIAGNOSIS — Z992 Dependence on renal dialysis: Secondary | ICD-10-CM | POA: Diagnosis not present

## 2021-10-22 DIAGNOSIS — R531 Weakness: Secondary | ICD-10-CM | POA: Diagnosis not present

## 2021-10-22 DIAGNOSIS — Z515 Encounter for palliative care: Secondary | ICD-10-CM | POA: Diagnosis not present

## 2021-10-22 DIAGNOSIS — T8189XA Other complications of procedures, not elsewhere classified, initial encounter: Secondary | ICD-10-CM | POA: Diagnosis not present

## 2021-10-22 LAB — VITAMIN C: Vitamin C: <0.1 mg/dL — ABNORMAL LOW (ref 0.4–2.0)

## 2021-10-22 LAB — RENAL FUNCTION PANEL
Albumin: 2.2 g/dL — ABNORMAL LOW (ref 3.5–5.0)
Anion gap: 13 (ref 5–15)
BUN: 21 mg/dL (ref 8–23)
CO2: 24 mmol/L (ref 22–32)
Calcium: 10.5 mg/dL — ABNORMAL HIGH (ref 8.9–10.3)
Chloride: 97 mmol/L — ABNORMAL LOW (ref 98–111)
Creatinine, Ser: 2.89 mg/dL — ABNORMAL HIGH (ref 0.61–1.24)
GFR, Estimated: 23 mL/min — ABNORMAL LOW (ref >60)
Glucose, Bld: 85 mg/dL (ref 70–99)
Phosphorus: 4.8 mg/dL — ABNORMAL HIGH (ref 2.5–4.6)
Potassium: 4.8 mmol/L (ref 3.5–5.1)
Sodium: 134 mmol/L — ABNORMAL LOW (ref 135–145)

## 2021-10-22 LAB — CBC
HCT: 26.6 % — ABNORMAL LOW (ref 39.0–52.0)
Hemoglobin: 8.4 g/dL — ABNORMAL LOW (ref 13.0–17.0)
MCH: 29.6 pg (ref 26.0–34.0)
MCHC: 31.6 g/dL (ref 30.0–36.0)
MCV: 93.7 fL (ref 80.0–100.0)
Platelets: 120 10*3/uL — ABNORMAL LOW (ref 150–400)
RBC: 2.84 MIL/uL — ABNORMAL LOW (ref 4.22–5.81)
RDW: 17.2 % — ABNORMAL HIGH (ref 11.5–15.5)
WBC: 5.9 10*3/uL (ref 4.0–10.5)
nRBC: 0 % (ref 0.0–0.2)

## 2021-10-22 MED ORDER — ASCORBIC ACID 500 MG PO TABS
500.0000 mg | ORAL_TABLET | Freq: Two times a day (BID) | ORAL | Status: DC
  Administered 2021-10-22 – 2021-10-23 (×2): 500 mg via ORAL
  Filled 2021-10-22 (×2): qty 1

## 2021-10-22 NOTE — Progress Notes (Signed)
? LOS: 9 days  ?Patient Summary: Scott Castro is a 66 year old male with a past medical history of paroxysmal atrial fibrillation on eliquis, ESRD on HD MWF (19 years) s/p failed kidney transplant, Barrett's esophagus, and critical limb ischemia leading to bilateral BKA and R AKA, who presented with a non-healing wounds leading to revision of L BKA to AKA and R AKA to higher R AKA. ? ?Overnight Events: No acute overnight events. ? ?Interim History: Patient seen and evaluated at bedside by team. Patient reports feeling alright and about the same as yesterday. He endorses some lightheadedness, but denies shortness of breath, chest pain, palpitations, or pain. He reports vomiting once last night when trying to swallow a pill, but denies nausea. Patient has been able to eat without issue. Patient has no acute concerns. ? ?Objective: ?Vital signs in last 24 hours: ?Vitals:  ? 10/22/21 0349 10/22/21 0500 10/22/21 0727 10/22/21 1146  ?BP: (!) 121/50  102/74 (!) 89/52  ?Pulse: (!) 122  (!) 135 (!) 131  ?Resp: 12  16 (!) 21  ?Temp: 98 ?F (36.7 ?C)  98.7 ?F (37.1 ?C) 98.7 ?F (37.1 ?C)  ?TempSrc: Oral  Oral Oral  ?SpO2: 98%  99% 92%  ?Weight:  66.3 kg    ?Height:      ? ?SpO2: 92 % ?Weight change: -4 kg ? ?Intake/Output Summary (Last 24 hours) at 10/22/2021 1344 ?Last data filed at 10/22/2021 1100 ?Gross per 24 hour  ?Intake 1011.43 ml  ?Output --  ?Net 1011.43 ml  ?Physical Exam ?Constitutional: Chronically-ill appearing, malnourished male laying in bed. In no acute distress ?Cardiovascular: Tachycardic, irregular rhythm. Referred sound from AV fistula heard. ?Respiratory: Normal effort, no respiratory distress on room air. ?Abdominal: Soft, non-tender, non-distended. ?Musculoskeletal: Slight edema seen in lower extremities. Bilateral lower extremities covered with clean, dry, dressings.  ?GU: scrotal edema ?Neurological: Alert and oriented x4, no apparent focal deficits noted on exam. ?Skin: Warm and dry. Ecchymoses noted on  bilateral upper extremities. ? ?Lab Results: ?Heparin induced platelet Ab 0.137 ?Vitamin C <0.1 ?Zinc 61 ?Serotonin release assay pending ?Vitamin A pending ? ?CBC Latest Ref Rng & Units 10/22/2021 10/21/2021 10/20/2021  ?WBC 4.0 - 10.5 K/uL 5.9 4.9 5.3  ?Hemoglobin 13.0 - 17.0 g/dL 8.4(L) 8.2(L) 8.1(L)  ?Hematocrit 39.0 - 52.0 % 26.6(L) 24.7(L) 23.8(L)  ?Platelets 150 - 400 K/uL 120(L) 78(L) 77(L)  ? ?CMP Latest Ref Rng & Units 10/22/2021 10/21/2021 10/20/2021  ?Glucose 70 - 99 mg/dL 85 109(H) 110(H)  ?BUN 8 - 23 mg/dL '21 13 18  '$ ?Creatinine 0.61 - 1.24 mg/dL 2.89(H) 1.88(H) 2.58(H)  ?Sodium 135 - 145 mmol/L 134(L) 136 139  ?Potassium 3.5 - 5.1 mmol/L 4.8 4.1 3.9  ?Chloride 98 - 111 mmol/L 97(L) 96(L) 98  ?CO2 22 - 32 mmol/L '24 24 23  '$ ?Calcium 8.9 - 10.3 mg/dL 10.5(H) 9.8 9.8  ?Total Protein 6.5 - 8.1 g/dL - 5.2(L) -  ?Total Bilirubin 0.3 - 1.2 mg/dL - 1.2 -  ?Alkaline Phos 38 - 126 U/L - 110 -  ?AST 15 - 41 U/L - 17 -  ?ALT 0 - 44 U/L - 9 -  ?  ?Studies/Results: ? ?No results found.  ? ?Medications: ?Scheduled Meds: ? sodium chloride   Intravenous Once  ? sodium chloride   Intravenous Once  ? acetaminophen  1,000 mg Oral TID  ? vitamin C  250 mg Oral BID  ? atorvastatin  10 mg Oral QHS  ? Chlorhexidine Gluconate Cloth  6 each Topical  [id number]  ? darbepoetin (ARANESP) injection - DIALYSIS  150 mcg Intravenous Q Thu-HD  ? gabapentin  100 mg Oral BID  ? Gerhardt's butt cream   Topical Daily  ? midodrine  15 mg Oral QID  ? multivitamin  1 tablet Oral QHS  ? sertraline  100 mg Oral Daily  ? zinc sulfate  220 mg Oral Daily  ? ?Continuous Infusions: ? amiodarone 30 mg/hr (10/22/21 0352)  ? sodium chloride    ? sodium thiosulfate infusion for calciphylaxis    ? ?PRN Meds:.HYDROmorphone, methocarbamol, ondansetron **OR** ondansetron (ZOFRAN) IV, oxyCODONE, senna-docusate, simethicone ?Assessment/Plan: ?Principal Problem: ?  Nonhealing surgical wound ?Active Problems: ?  ESRD on hemodialysis ([place]) ? ?#Paroxysmal Atrial Fibrillation  w/ RVR ?#LV dysfunction EF 30-35% with biventricular HF and severe pulmonary HTN ?#Cardiomyopathy ?On exam, patient remains tachycardic reverting to afib/flutter from sinus tachycardia. Started on amiodarone despite initial lung findings given overall goals of care and palliation. Remains on amiodarone drip due to uncontrolled HR. Not a candidate for digoxin given ESRD. Not a candidate for CCB given new cardiomyopathy and unable to tolerate beta blocker given borderline BP. Heparin-induced platelet Ab 0.137, ruling out HIT. Thrombocytopenia may be due to receiving vancomycin. Platelets increased to 120k today up from 78. Will continue to hold anticoagulation, but will need to balance risk of stroke and bleeding. Per cardiology, tachycardia may be contributing to decreased LV function with EF 30-35%. ?- amiodarone gtt, transition to '400mg'$  PO BID for one week then '200mg'$  daily when stable. Patient heart rate remains uncontrolled.  We will continue to monitor closely. ?- Patient blood pressures remain borderline and he is not a candidate for CRRT or pressor support. ?- Holding anticoagulation due to thrombocytopenia ?- Palliative care following.  Patient with overall poor prognosis given multiple co-morbidities and worsening cardiac status ?- Spiritual care ? ?#Abnormal chest CT ?History of abnormal chest CT w/o contrast on 08/31/21 which noted patchy nodularity and ground glass appearance in the RUL and BLL; masslike appearance in the RLL. Repeat CT shows decrease in patchy and nodular ground-glass and consolidative opacity in RLL; development of patchy ground-glass attenuation in LUL suggestive of infectious/inflammatory process; otherwise stable appearance of loculated pleural effusion.  ? ?#ESRD on HD MWF with anuria ?#Secondary hyperparathyroidism ?#Hypotension ?#Anasarca ?Nephrology planning for HD today and Saturday. Blood pressure is measured using cuff on right forearm due to midline on right upper arm and AV  fistula on left upper arm, so may be inaccurate. ?- Nephrology following, appreciate their assistance with HD ?- Trend renal function panel ?- Avoid nephrotoxic medications ?- Midodrine '15mg'$  QID ? ?#Scrotal edema ?Scrotal edema unchanged from before. Patient not complaining of pain. Edema is likely due to dependent positioning and recent volume overload. ?- Scrotal elevation ? ?#L AKA 2/2 non-healing wound on L BKA ?#Revision of R AKA ?POD #6 from revision of L BKA to AKA and revision of R AKA to higher R AKA. Per vascular surgery, incisions are healing nicely. He remains afebrile without leukocytosis. Hgb stable at 8.4; 8.2 yesterday. ?-VVS following, appreciate their assistance ? - Dressing changes as needed ?- Trend CBC and fever curve ? ?Diet: Renal with fluid restriction ?VTE: none ?IVF: none ?Code: DNR ? ?Prior to Admission Living Arrangement: SNF ?Anticipated Discharge Location: SNF ?Barriers to Discharge: medical stability ?Dispo: Anticipated discharge in approximately 4-5 day(s).  ? ?This is a Careers information officer Note.  The care of the patient was discussed with Dr. Heber Bagley and the assessment and plan formulated with  their assistance.  Please see their attached note for official documentation of the daily encounter. ? ?Nelva Nay, Medical Student ?10/22/2021, 1:44 PM ?Pager: [phone] ?After 5pm on weekdays and 1pm on weekends: On Call pager [phone] ?  ?

## 2021-10-22 NOTE — Progress Notes (Signed)
Patient ID: [name], male   DOB: 1956/07/12, 66 y.o.   MRN: [id number] ? ? ? ?Progress Note from the Palliative Medicine Team at [facility] ? ? ?Patient Name: Scott Castro        ?Date: 10/22/2021 ?DOB: Jun 02, 1956  Age: 66 y.o. MRN#: [id number] ?Attending Physician: Lucious Groves, DO ?Primary Care Physician: Franciso Bend, MD ?Admit Date: 10/13/2021 ? ? ?Medical records reviewed  ? ?Scott Castro is a 66 year old male with a past medical history of paroxysmal atrial fibrillation on eliquis, ESRD on HD MWF (19 years) s/p failed kidney transplant, Barrett's esophagus, and critical limb ischemia leading to bilateral BKA and R AKA, who presented with a non-healing wounds leading to revision of L BKA to AKA and R AKA to higher R AKA. ?  ?Patient faces treatment option , advanced directive decisions and anticipatory care needs.  ? ?This NP visited patient at the bedside as a follow up to  yesterday's [place].  Continued conversation regarding patient's complex medical situation, multiple comorbidities and the seriousness of his current medical situation. ? ?Education offered to patient on concepts related to adult failure to thrive and the limitations of medical interventions to prolong life when a body fails to thrive.  ? ?Education offered on his high risk to decompensate.  Patient verbalizes "I understand", however at this time he remains open to all offered and available medical interventions top prolong life.  ? ?Today is is talking about "getting prosthetics and walking again:.   His insight into the seriousness of the situation and the associated poor prognosis is limited. ? ?Patient denies discomfort/ pain or nausea at this time. ? ?I spoke to his HPOA/Lori Blevins  and expressed the medical teams concerns.  Questions and concerns addressed.  Cecille Rubin had questions regarding natural trajectory of this situation.   Education offered that it may come to a point when Mr Viveros will not be able to continue with  dialysis and then prognosis will clearly be days to weeks.  ? ?Education with patient on the importance of continued conversation with Cecille Rubin and his  medical providers regarding overall plan of care and treatment options,  ensuring decisions are within the context of the patients values and GOCs. ? ?Questions and concerns addressed   Discussed with attending team ? ?This nurse practitioner informed  the patient/family that I will be out of the hospital for 10 days.  A PMT provider will continue to support holistically  ?Call palliative medicine team phone # [phone] with questions or concerns.  ? ? ? ?Wadie Lessen NP  ?Palliative Medicine Team Team Phone # [phone] ?Pager [phone] ?  ?

## 2021-10-22 NOTE — Progress Notes (Signed)
LA restricted for HD.  RA assessed for PIV.  Dependant edema present mid forearm to above elbow, purple discoloration present.  No suitable vein noted for Amio infusion.  Dr Marlou Sa requested midline removal once PIV obtained.  Recommendations to maintain midline for Amio due to nonsuitable veins present for PIV, would be unable to note infiltrate distal forearm, especially with BP to be taken proximal to PIV, increasing the risk of infiltrate of Amiodarone.  Edema would also impede IV watch monitoring.Secure chat sent to Dr Marlou Sa and Hildred Alamin RN re this information and that Amiodarone is not recommended via midline.  Recommendations for CVC placement if Amiodarone infusion cannot be changed to po.  Pt also aware of these recommendations and requests midline to remain in place.  Dr Marlou Sa replied stating hoping to change to po Friday. ?

## 2021-10-22 NOTE — Progress Notes (Addendum)
[facility] ? ?VASCULAR SURGERY ASSESSMENT & PLAN:  ? ?POD 6 LEFT AKA AND REVISION OF RIGHT AKA: Both incisions are healing nicely.  Continue daily dressing changes. ?  ?NUTRITION: He has severe protein calorie malnutrition. Appreciate assistance from the nutrition team. ?  ?ID: His intravenous antibiotics have been discontinued. ?  ?DVT PROPHYLAXIS: His heparin is on hold given his thrombocytopenia and a concern for HIT.  Platelet count today is up to 120 K today.  ? ?Gae Gallop, MD ?8:10 AM ? ? ?Subjective  - Feels depressed and weak. ? ?Objective ?102/74 ?(!) 135 ?98.7 ?F (37.1 ?C) (Oral) ?16 ?99% ? ?Intake/Output Summary (Last 24 hours) at 10/22/2021 0737 ?Last data filed at 10/22/2021 0349 ?Gross per 24 hour  ?Intake 771.43 ml  ?Output --  ?Net 771.43 ml  ? ? ?Dressings changed this am @ 4.  Patient refused to allow me to change agin this am.  Current dressing dry. ?Alert and oriented ?Lungs non labored breathing ? ? ?Assessment/Planning: ?S/P B AKA with revisions for non healing incisions. ? ?RN noted decreased SS fluid drainage.  Continue dressing changes as needed until drainage has stopped. ?Platelet count improved from 78 to 120 now, Heparin on hold.  ? ? ?Roxy Horseman ?10/22/2021 ?7:37 AM ?-- ? ?Laboratory ?Lab Results: ?Recent Labs  ?  10/21/21 ?0104 10/22/21 ?0154  ?WBC 4.9 5.9  ?HGB 8.2* 8.4*  ?HCT 24.7* 26.6*  ?PLT 78* 120*  ? ?BMET ?Recent Labs  ?  10/21/21 ?0104 10/22/21 ?0154  ?NA 136 134*  ?K 4.1 4.8  ?CL 96* 97*  ?CO2 24 24  ?GLUCOSE 109* 85  ?BUN 13 21  ?CREATININE 1.88* 2.89*  ?CALCIUM 9.8 10.5*  ? ? ?COAG ?Lab Results  ?Component Value Date  ? INR 1.6 (H) 10/13/2021  ? INR 1.7 (H) 08/05/2021  ? INR 1.1 07/18/2021  ? ?No results found for: PTT ? ? ? ?

## 2021-10-22 NOTE — Plan of Care (Signed)
His heart rates remain elevated. Can transition to PO once his heart rates are better controlled; they are improving, suspect this can be attempted tomorrow. Again agree with palliative care plans. Cardiology will sign off as there are no plans for further recommendations. Don't hesitate to reach out for further questions ?

## 2021-10-22 NOTE — Progress Notes (Addendum)
Nutrition Follow-up ? ?DOCUMENTATION CODES:  ? ?Severe malnutrition in context of chronic illness ? ?INTERVENTION:  ?Continue Mighty Shake II TID with meals, each supplement provides 480-500 kcals and 20-23 grams of protein ?Magic Cup BID  ?Continue renavit daily  ?Vitamin C 500 mg BID x 30 days  ?Discontinue Zinc 220 mg BID x 14 days ?Monitor Vitamin A lab results (pending)  ?Room service with assistance ?Manager's check for meal trays  ? ?NUTRITION DIAGNOSIS:  ? ?Severe Malnutrition related to chronic illness (ESRD on HD) as evidenced by severe fat depletion, severe muscle depletion. ? ?Ongoing  ? ?GOAL:  ? ?Patient will meet greater than or equal to 90% of their needs ? ?Progressing; addressing via PO intake and oral nutrition supplements  ? ?MONITOR:  ? ?PO intake, Supplement acceptance, Labs, Weight trends ? ?REASON FOR ASSESSMENT:  ? ?Consult ?Assessment of nutrition requirement/status ? ?ASSESSMENT:  ? ?Pt is a 66 year old male with past medical history significant of paroxysmal A-fib on eliquis, ESRD on HD s/p failed kidney transplant, Barrett's esophagus, glomerulosclerosis, hyperparathyroidism due to renal insufficiency and critical limb ischemia leading to bilateral BKA and R AKA who was sent by vascular surgery concerning for infection of non-healing wound on left surgical site leading to revision of L BKA to AKA and R AKA to higher AKA. ? ?3/15 @ 1344 - diet downgraded to Dysphagia 3 diet, nectar thick liquids with 1200 ml fluid restriction as pt seen by SLP yesterday and pt reported increased difficulty swallowing due to "dry food" and pt requesting to downgrade to Dysphagia 3 diet after options discussed with SLP.  ? ?Met with pt at bedside. Per pt, pt endorses a good appetite today but complains of the food not tasting good. Pt reports eating breakfast today but reports that they brought him a pancake that was not bite sized/chopped and that he could not eat it due to difficulty cutting it up  himself. Pt's friend present in room during visit and reports that pt's food from last night and this morning has not been chopped up for him and pt complains of having to do it himself. Pt also reports that he ate some of his oatmeal this morning, but they didn't put any sugar on his tray and pt unaware that he needed to request sugar on his tray when ordering meal. Dietetic intern to recommend room service with assistance for pt as pt states having trouble ordering his meals on his own and pt agreeable to this plan. Pt also reports receiving Mighty Shake on his meal trays and that he has been drinking them and tolerating well. Pt denies any nausea or vomiting, but reports some constipation and states that he passed a small BM the other day, but reports having some constipation. Per chart review, senokot-s is ordered for pt prn.  ? ?Made pt aware that Vitamin C lab value was low and we recommend supplementing pt on Vitamin C 500 mg BID x 30 days, pt agreeable to this plan. Discussed with pt the importance of good nutrition and adequate PO intake to promote wound healing. Discussed adequate intake of protein and supplementing with Vitamin C to aid in wound healing. Dietetic intern to recommend manager's check for all of patient's meal trays to ensure food is being properly chopped/bite-sized due to pt being on a Dysphagia 3 diet.  ? ?Vitamin Labs:  ?Vitamin C: <0.1 (L)  ?Zinc: 61 ?Vitamin A: pending  ? ?Medications reviewed.  ? ?Pt last HD on  3/14 with net UF of 1538 mL removed.  ? ?Admit wt: 65 kg ?Current wt: 66.3 kg  ?EDW: 60.3 kg  ? ?Diet Order:   ?Diet Order   ? ?       ?  DIET DYS 3 Room service appropriate? Yes; Fluid consistency: Nectar Thick; Fluid restriction: 1200 mL Fluid  Diet effective now       ?  ? ?  ?  ? ?  ? ? ?EDUCATION NEEDS:  ? ?Not appropriate for education at this time ? ?Skin:  Skin Assessment: Skin Integrity Issues: ?Skin Integrity Issues:: Other (Comment), Incisions ?Incisions: (closed);  left and right anterior distal thigh ?Other: wound/incision - MASD; perineum ? ?Last BM:  3/14 ? ?Height:  ? ?Ht Readings from Last 1 Encounters:  ?10/16/21 _0  (1.753 m)  ? ? ?Weight:  ? ?Wt Readings from Last 1 Encounters:  ?10/22/21 66.3 kg  ? ? ?Ideal Body Weight:    ? ?BMI:  Body mass index is 21.58 kg/m?. ? ?Estimated Nutritional Needs:  ? ?Kcal:  1750 - 1950 ? ?Protein:  85 - 100 grams ? ?Fluid:  1000 mL + UOP ? ? ? ?Scott Castro, Dietetic Intern ?10/22/2021 11:40 AM ?

## 2021-10-22 NOTE — Progress Notes (Signed)
[facility] ?Progress Note ? ? OP HD: MWF [place] (was [employer])    ?    4h  400/600  60.3kg  2/2.5 bath  LUA AVG  Hep none ? - hect 3 ug tiw IV (dc'd here) ? - sodium thiosulfate 25 gm tiw IV  ? - home: renvela packet 2.4gm ac tid ?   ?Assessment/ Plan: ?Wound infection of BKA stump - IV abx per pmd. SP new L AKA and revision of R AKA 3/10 by VVS.  ?Hypotension - on midodrine 10 tid, ^ to 15 tid. BP's soft. Overall  very poor prognosis with his multitude of comorbidities + refractory hypotension already on midodrine.  Pt is DNR. Pt is not a candidate for CRRT, pressor support etc.  Appreciate palliative care seeing patient (they are very familiar with him from past hospitalizations as well); poor performance status as well.  ?Volume - certainly is in positive Na balance; tolerating some UF. ?Afib RVR - per pmd and cards -> on amiodarone; options limited. ?ESRD - usually on HD MWF. HD 3/14 with net 1.5L. ? ?Next HD today  and will keep on TTS regimen for now because of staffing. ?Discussed whether he wanted to cont dialysis (not improving QOL) and his response was why would he not want dialysis. ? ?Anemia ckd - Hb 8.2 -> will dose aranesp today (150) ?MBD ckd - Ca++ high and hx of calciphylaxis, hold any Ca/ vit D products. Phos in range, cont binder.  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 DC'd hectorol.  ?DNR ? ? ?Subjective: seen in room w/ friend bedside; no new c/o, denies f/c/n/v/sob. Stated the towel acting as a scrotal sling yest had been helping ? ?Vitals:  ? 10/22/21 0000 10/22/21 0349 10/22/21 0500 10/22/21 0727  ?BP: (!) 82/58 (!) 121/50  102/74  ?Pulse: (!) 138 (!) 122  (!) 135  ?Resp: (!) '22 12  16  '$ ?Temp: 98.2 ?F (36.8 ?C) 98 ?F (36.7 ?C)  98.7 ?F (37.1 ?C)  ?TempSrc: Oral Oral  Oral  ?SpO2: 98% 98%  99%  ?Weight:   66.3 kg   ?Height:      ? ? ?Exam: ?Gen alert, frail, nasal cann O2, no distress ?No jvd or bruits ?Chest clear bilat to bases ?RRR  no RG ?Abd soft ntnd ?Ext bilat AKA, diffuse 1-2+ UE and LE stump edema + scrotal edema ?Neuro is alert, Ox 3 , nf ?  LUA AVF+bruit (firm in aneurysm in arterial limb), clot? But strong bruit ?  ?  ?  ?  ? Home meds include - eliquis 5 bid, lipitor, neurontin 100 bid, midodrine 10 tid, zoloft,  ?  ?  CXR - 3/7- chronic changes, mild CM, no gross edema , small bilat effusions ?     ? ? ?Recent Labs  ?Lab 10/21/21 ?0104 10/22/21 ?0154  ?K 4.1 4.8  ?BUN 13 21  ?CREATININE 1.88* 2.89*  ?ALBUMIN 2.4*  2.5* 2.2*  ?CALCIUM 9.8 10.5*  ?PHOS 3.4 4.8*  ?HGB 8.2* 8.4*  ? ?Inpatient medications: ? sodium chloride   Intravenous Once  ? sodium chloride   Intravenous Once  ? acetaminophen  1,000 mg Oral TID  ? vitamin C  250 mg Oral BID  ? atorvastatin  10 mg Oral QHS  ? Chlorhexidine Gluconate Cloth  6 each Topical Q0600  ? darbepoetin (ARANESP) injection - DIALYSIS  150 mcg Intravenous Q Thu-HD  ? gabapentin  100 mg Oral BID  ? Gerhardt's butt cream   Topical Daily  ? midodrine  15 mg Oral QID  ? multivitamin  1 tablet Oral QHS  ? sertraline  100 mg Oral Daily  ? zinc sulfate  220 mg Oral Daily  ? ? amiodarone 30 mg/hr (10/22/21 0352)  ? sodium chloride    ? sodium thiosulfate infusion for calciphylaxis    ? ?HYDROmorphone, methocarbamol, ondansetron **OR** ondansetron (ZOFRAN) IV, oxyCODONE, senna-docusate, simethicone ? ? ? ? ? ? ?

## 2021-10-23 DIAGNOSIS — T8189XA Other complications of procedures, not elsewhere classified, initial encounter: Secondary | ICD-10-CM | POA: Diagnosis not present

## 2021-10-23 DIAGNOSIS — Z7189 Other specified counseling: Secondary | ICD-10-CM

## 2021-10-23 DIAGNOSIS — Z992 Dependence on renal dialysis: Secondary | ICD-10-CM | POA: Diagnosis not present

## 2021-10-23 DIAGNOSIS — Z789 Other specified health status: Secondary | ICD-10-CM

## 2021-10-23 DIAGNOSIS — N186 End stage renal disease: Secondary | ICD-10-CM | POA: Diagnosis not present

## 2021-10-23 DIAGNOSIS — I4891 Unspecified atrial fibrillation: Secondary | ICD-10-CM | POA: Diagnosis not present

## 2021-10-23 DIAGNOSIS — Z66 Do not resuscitate: Secondary | ICD-10-CM

## 2021-10-23 LAB — RENAL FUNCTION PANEL
Albumin: 2.2 g/dL — ABNORMAL LOW (ref 3.5–5.0)
Anion gap: 11 (ref 5–15)
BUN: 12 mg/dL (ref 8–23)
CO2: 27 mmol/L (ref 22–32)
Calcium: 9.4 mg/dL (ref 8.9–10.3)
Chloride: 99 mmol/L (ref 98–111)
Creatinine, Ser: 1.99 mg/dL — ABNORMAL HIGH (ref 0.61–1.24)
GFR, Estimated: 36 mL/min — ABNORMAL LOW (ref >60)
Glucose, Bld: 122 mg/dL — ABNORMAL HIGH (ref 70–99)
Phosphorus: 3.1 mg/dL (ref 2.5–4.6)
Potassium: 4 mmol/L (ref 3.5–5.1)
Sodium: 137 mmol/L (ref 135–145)

## 2021-10-23 LAB — CBC
HCT: 24.1 % — ABNORMAL LOW (ref 39.0–52.0)
Hemoglobin: 7.9 g/dL — ABNORMAL LOW (ref 13.0–17.0)
MCH: 30.7 pg (ref 26.0–34.0)
MCHC: 32.8 g/dL (ref 30.0–36.0)
MCV: 93.8 fL (ref 80.0–100.0)
Platelets: 116 10*3/uL — ABNORMAL LOW (ref 150–400)
RBC: 2.57 MIL/uL — ABNORMAL LOW (ref 4.22–5.81)
RDW: 17.7 % — ABNORMAL HIGH (ref 11.5–15.5)
WBC: 5.3 10*3/uL (ref 4.0–10.5)
nRBC: 0 % (ref 0.0–0.2)

## 2021-10-23 LAB — VITAMIN A: Vitamin A (Retinoic Acid): 10.3 ug/dL — ABNORMAL LOW (ref 22.0–69.5)

## 2021-10-23 MED ORDER — HYDROMORPHONE HCL 1 MG/ML IJ SOLN
0.5000 mg | INTRAMUSCULAR | Status: DC | PRN
  Administered 2021-10-24 – 2021-10-25 (×2): 1 mg via INTRAVENOUS
  Filled 2021-10-23 (×2): qty 1

## 2021-10-23 MED ORDER — GLYCOPYRROLATE 0.2 MG/ML IJ SOLN: 0.2000 mg | INTRAMUSCULAR | Status: DC | PRN

## 2021-10-23 MED ORDER — HALOPERIDOL LACTATE 2 MG/ML PO CONC
2.0000 mg | Freq: Four times a day (QID) | ORAL | Status: DC | PRN
  Filled 2021-10-23: qty 1

## 2021-10-23 MED ORDER — ACETAMINOPHEN 650 MG RE SUPP: 650.0000 mg | Freq: Four times a day (QID) | RECTAL | Status: DC | PRN

## 2021-10-23 MED ORDER — AMIODARONE HCL 200 MG PO TABS
400.0000 mg | ORAL_TABLET | Freq: Two times a day (BID) | ORAL | Status: DC
  Administered 2021-10-23 – 2021-10-24 (×2): 400 mg via ORAL
  Filled 2021-10-23 (×3): qty 2

## 2021-10-23 MED ORDER — HALOPERIDOL LACTATE 5 MG/ML IJ SOLN: 2.0000 mg | Freq: Four times a day (QID) | INTRAMUSCULAR | Status: DC | PRN

## 2021-10-23 MED ORDER — LORAZEPAM 2 MG/ML IJ SOLN: 1.0000 mg | INTRAMUSCULAR | Status: DC | PRN

## 2021-10-23 MED ORDER — BIOTENE DRY MOUTH MT LIQD
15.0000 mL | Freq: Two times a day (BID) | OROMUCOSAL | Status: DC
  Administered 2021-10-24 – 2021-10-25 (×3): 15 mL via TOPICAL

## 2021-10-23 MED ORDER — LORAZEPAM 1 MG PO TABS: 1.0000 mg | ORAL_TABLET | ORAL | Status: DC | PRN

## 2021-10-23 MED ORDER — POLYVINYL ALCOHOL 1.4 % OP SOLN
1.0000 [drp] | Freq: Four times a day (QID) | OPHTHALMIC | Status: DC | PRN
  Filled 2021-10-23: qty 15

## 2021-10-23 MED ORDER — LORAZEPAM 2 MG/ML PO CONC: 1.0000 mg | ORAL | Status: DC | PRN

## 2021-10-23 MED ORDER — GLYCOPYRROLATE 1 MG PO TABS
1.0000 mg | ORAL_TABLET | ORAL | Status: DC | PRN
  Filled 2021-10-23: qty 1

## 2021-10-23 MED ORDER — ACETAMINOPHEN 325 MG PO TABS: 650.0000 mg | ORAL_TABLET | Freq: Four times a day (QID) | ORAL | Status: DC | PRN

## 2021-10-23 MED ORDER — HALOPERIDOL 1 MG PO TABS
2.0000 mg | ORAL_TABLET | Freq: Four times a day (QID) | ORAL | Status: DC | PRN
  Filled 2021-10-23: qty 2

## 2021-10-23 MED ORDER — ALBUMIN HUMAN 25 % IV SOLN
INTRAVENOUS | Status: AC
  Administered 2021-10-23: 25 g
  Filled 2021-10-23: qty 100

## 2021-10-23 MED ORDER — AMIODARONE HCL 200 MG PO TABS: 200.0000 mg | ORAL_TABLET | Freq: Every day | ORAL | Status: DC

## 2021-10-23 NOTE — Plan of Care (Signed)
?  Problem: Education: ?Goal: Knowledge of General Education information will improve ?Description: Including pain rating scale, medication(s)/side effects and non-pharmacologic comfort measures ?Outcome: Progressing ?  ?Problem: Health Behavior/Discharge Planning: ?Goal: Ability to manage health-related needs will improve ?Outcome: Not Progressing ?  ?Problem: Clinical Measurements: ?Goal: Ability to maintain clinical measurements within normal limits will improve ?Outcome: Not Progressing ?Goal: Will remain free from infection ?Outcome: Progressing ?Goal: Diagnostic test results will improve ?Outcome: Not Progressing ?Goal: Respiratory complications will improve ?Outcome: Progressing ?Goal: Cardiovascular complication will be avoided ?Outcome: Not Progressing ?  ?Problem: Activity: ?Goal: Risk for activity intolerance will decrease ?Outcome: Not Progressing ?  ?Problem: Nutrition: ?Goal: Adequate nutrition will be maintained ?Outcome: Not Progressing ?  ?Problem: Coping: ?Goal: Level of anxiety will decrease ?Outcome: Not Progressing ?  ?Problem: Elimination: ?Goal: Will not experience complications related to bowel motility ?Outcome: Progressing ?Goal: Will not experience complications related to urinary retention ?Outcome: Not Applicable ?  ?Problem: Pain Managment: ?Goal: General experience of comfort will improve ?Outcome: Progressing ?  ?Problem: Safety: ?Goal: Ability to remain free from injury will improve ?Outcome: Not Progressing ?  ?Problem: Skin Integrity: ?Goal: Risk for impaired skin integrity will decrease ?Outcome: Not Progressing ?  ?

## 2021-10-23 NOTE — Progress Notes (Addendum)
Patient Scott Castro      DOB: [birth year]      OEV:[id number] ? ? ? ?  ?Palliative Medicine Team ? ? ? ? ?Subjective: Bedside Symptom Check. ? ? ?Physical exam: Patient resting in bed watching tv when this RN stopped by for visit.  No family or visitors were present. He stated that he had ongoing dull pain from his BLE but otherwise was comfortable in bed. This RN asked about his earlier conversations with nephrology and hospitalist providers. He echoed that he felt dialysis is not an option provided to him as it has not been beneficial for his body for the last few treatments. This RN asked what his primary goal is for his care today. He replied that he would like to increase his blood pressure so that he can have dialysis. This RN clarified with him that his specialists are not recommending him continuing dialysis and he agreed that he understood but is still his priority to improve. This RN asked him about the "what -ifs" and explained that the palliative team is here to support his goals of care, no matter his choice. He stated "If I cannot do dialysis then I will die." This RN encouraged him to speak about any preferences he would have if that was his path. He stated he would like to stay in the hospital and receive medications to remain completely comfortable. This RN asked his permission for a NP from our team to follow up today and further discuss his goals of care and he was agreeable. This RN asked if there was anyone else he would like to be called and he said just his cousin, who is his 43 and already coming to visit. He declined any further needs or concerns at this time.  ? ? ?Assessment and plan: Palliative NP will follow up this afternoon as able and further discuss GOC. PMT available throughout weekend to assist with needs or advances.  ? ? ?Thank you for allowing the Palliative Medicine Team to assist in the care of this patient. ?  ?  ?Damian Leavell, MSN, RN ?Palliative Medicine Team ?Team  Phone: [phone]  ?This phone is monitored 7a-7p, please reach out to attending physician outside of these hours for urgent needs.   ?

## 2021-10-23 NOTE — Progress Notes (Addendum)
?  Progress Note ? ? ? ?10/23/2021 ?7:57 AM ?7 Days Post-Op ? ?Subjective:  pain B LE.  Patient states dressings were already changed this morning ? ? ?Vitals:  ? 10/23/21 0438 10/23/21 0726  ?BP: (!) 82/60 (!) 79/48  ?Pulse: (!) 120   ?Resp: 20 17  ?Temp: 97.7 ?F (36.5 ?C)   ?SpO2: 97%   ? ?Physical Exam: ?Lungs:  non labored ?Incisions: dressing left in place ?Abdomen:  soft, NT ?Neurologic: A&O ? ?CBC ?   ?Component Value Date/Time  ? WBC 5.9 10/22/2021 0154  ? RBC 2.84 (L) 10/22/2021 0154  ? HGB 8.4 (L) 10/22/2021 0154  ? HGB 11.4 (L) 06/11/2012 0501  ? HCT 26.6 (L) 10/22/2021 0154  ? HCT 34.6 (L) 06/11/2012 0501  ? PLT 120 (L) 10/22/2021 0154  ? PLT 251 06/11/2012 0501  ? MCV 93.7 10/22/2021 0154  ? MCV 95 06/11/2012 0501  ? MCH 29.6 10/22/2021 0154  ? MCHC 31.6 10/22/2021 0154  ? RDW 17.2 (H) 10/22/2021 0154  ? RDW 14.4 06/11/2012 0501  ? LYMPHSABS 1.2 10/13/2021 1310  ? LYMPHSABS 1.2 06/11/2012 0501  ? MONOABS 0.6 10/13/2021 1310  ? MONOABS 0.7 06/11/2012 0501  ? EOSABS 0.0 10/13/2021 1310  ? EOSABS 0.2 06/11/2012 0501  ? BASOSABS 0.0 10/13/2021 1310  ? BASOSABS 0.1 06/11/2012 0501  ? ? ?BMET ?   ?Component Value Date/Time  ? NA 134 (L) 10/22/2021 0154  ? NA 142 06/10/2012 0924  ? K 4.8 10/22/2021 0154  ? K 4.5 06/16/2013 1315  ? CL 97 (L) 10/22/2021 0154  ? CL 102 06/10/2012 0924  ? CO2 24 10/22/2021 0154  ? CO2 29 06/10/2012 0924  ? GLUCOSE 85 10/22/2021 0154  ? GLUCOSE 83 06/10/2012 0924  ? BUN 21 10/22/2021 0154  ? BUN 35 (H) 06/10/2012 9937  ? CREATININE 2.89 (H) 10/22/2021 0154  ? CREATININE 10.29 (H) 06/10/2012 1696  ? CALCIUM 10.5 (H) 10/22/2021 0154  ? CALCIUM 9.2 06/10/2012 0924  ? GFRNONAA 23 (L) 10/22/2021 0154  ? GFRNONAA 5 (L) 06/10/2012 7893  ? GFRAA 9 (L) 09/18/2019 0430  ? GFRAA 6 (L) 06/10/2012 8101  ? ? ?INR ?   ?Component Value Date/Time  ? INR 1.6 (H) 10/13/2021 1310  ? ? ? ?Intake/Output Summary (Last 24 hours) at 10/23/2021 0757 ?Last data filed at 10/23/2021 0600 ?Gross per 24 hour   ?Intake 1658.65 ml  ?Output -186 ml  ?Net 1844.65 ml  ? ? ? ?Assessment/Plan:  66 y.o. male is s/p L AKA and revision of R AKA 7 Days Post-Op  ? ?Continue daily dressing changes ?Encouraged nutrition again this morning ?Call vascular surgeon on call with questions over the weekend ? ? ?Dagoberto Ligas, PA-C ?[facility] ?[phone] ?10/23/2021 ?7:57 AM ? ?I have interviewed the patient and examined the patient. I agree with the findings by the PA.  I will check back on Monday.  If any problems over the weekend Dr. [name] is on call.  ? ?Gae Gallop, MD ? ? ?

## 2021-10-23 NOTE — Progress Notes (Signed)
? ?HD#10 ?SUBJECTIVE:  ?Patient Summary: Scott Castro is a 66 year old male with a past medical history of paroxysmal atrial fibrillation on eliquis, ESRD on HD MWF (19 years) s/p failed kidney transplant, Barrett's esophagus, and critical limb ischemia leading to bilateral BKA and R AKA, who presented with a non-healing wounds leading to revision of L BKA to AKA and R AKA to higher R AKA. ? ?Overnight Events: None ? ?Interim History: Patient evaluated at bedside. He made an attempt to heat breakfast this morning but did not love the food. He overall has no complaints today. I explained to him the severity of his illness at this time, specifically that we have been unable to get his heart rate under control and that his blood pressure is still low despite medications that should raise it. I explained to him that his blood pressure is limiting dialysis and that a lot of routine interventions to increase his blood pressure have been unsuccessful so far. I explained that we would switch the medicine for his high heart rate from IV to oral today. Overall our team conveyed to message that we are getting to a point where there are no further interventions and we need to keep in mind what he would want end of life to look like. After we assessed him, nephrology spoke with him and made him aware that he can no longer get dialysis through his AV fistula as it is clotted off and would require a tunneled catheter to be placed for further dialysis, and he said that he just wanted to be comfortable for now. He reiterated this sentiment with PT, and requested further palliative and hospice conversations.  ? ?OBJECTIVE:  ?Vital Signs: ?Vitals:  ? 10/23/21 0325 10/23/21 0438 10/23/21 0554 10/23/21 0726  ?BP: (!) 55/41 (!) 82/60  (!) 79/48  ?Pulse:  (!) 120    ?Resp: (!) '21 20  17  '$ ?Temp: 98.2 ?F (36.8 ?C) 97.7 ?F (36.5 ?C)    ?TempSrc: Oral Oral    ?SpO2:  97%    ?Weight:   66.6 kg   ?Height:      ? ?Supplemental O2: Room  Air ?SpO2: 97 % ? ?Filed Weights  ? 10/21/21 0546 10/22/21 0500 10/23/21 0554  ?Weight: 66.5 kg 66.3 kg 66.6 kg  ? ? ? ?Intake/Output Summary (Last 24 hours) at 10/23/2021 0744 ?Last data filed at 10/23/2021 0600 ?Gross per 24 hour  ?Intake 1658.65 ml  ?Output -186 ml  ?Net 1844.65 ml  ? ?Net IO Since Admission: 9,657.02 mL [10/23/21 0744] ? ?Physical Exam: ?Constitutional:Chronically ill and cachectic man in bed, no acute distress. ?Cardio:Irregular rhythm with tachycardia. Pulsating vasculature easily visible over chest and neck. Systolic murmur noted, likely referred from AV fistula. ?Pulm:Normal work of breathing on room air. ?OT:LXBWIOM edema unchanged. ?BTD:HRCBULAGT post-surgical AKA changes with dressings saturated with clear fluid. ?Skin:Warm and dry. ?Neuro:Alert and oriented x3. No focal deficit noted. ?Psych: Sad mood and affect. ? ?Patient Lines/Drains/Airways Status   ? ? Active Line/Drains/Airways   ? ? Name Placement date Placement time Site Days  ? Peripheral IV 10/16/21 22 G Right Hand 10/16/21  1021  Hand  7  ? Fistula / Graft Left Upper arm --  --  Upper arm  --  ? Midline Single Lumen 36/46/80 Right Basilic 32/12/24  --  Basilic  10  ? Negative Pressure Wound Therapy Thigh Anterior;Right 09/09/21  1454  --  44  ? Incision (Closed) 09/02/21 Leg Left 09/02/21  1114  --  51  ? Incision (Closed) 09/09/21 Thigh Right 09/09/21  1442  -- 44  ? Incision (Closed) 10/16/21 Thigh Anterior;Distal;Right 10/16/21  1900  -- 7  ? Incision (Closed) 10/16/21 Thigh Anterior;Distal;Left 10/16/21  1900  -- 7  ? Wound / Incision (Open or Dehisced) 08/26/21  Buttocks Right Abrasion 08/26/21  0900  Buttocks  58  ? Wound / Incision (Open or Dehisced) 08/26/21 Skin tear Other (Comment) Anterior;Right Stump 08/26/21  --  Other (Comment)  58  ? Wound / Incision (Open or Dehisced) 10/14/21 (MASD) Moisture Associated Skin Damage Perineum 10/14/21  0059  Perineum  9  ? ?  ?  ? ?  ? ? ? ?ASSESSMENT/PLAN:  ?Assessment: ?Principal  Problem: ?  Nonhealing surgical wound ?Active Problems: ?  ESRD on hemodialysis ([place]) ? ? ?Plan: ?#Paroxysmal Atrial Fibrillation w/ RVR ?#LV dysfunction EF 30-35% with biventricular HF and severe pulmonary HTN ?#Cardiomyopathy ?Patient remains in atrial fibrillation with RVR despite being on amiodarone drip. Unfortunately given combination of persistent hypotension, HFrEF, and ESRD, we are nearing the end of the list as far as interventions as fluid resuscitation hasn't improved his pressures, diltiazem cannot be used due to hypotension, and digoxin is not an option due to HD. Midodrine at increased doses and frequency has not improved his clinical status either. Further, his AV fistula has clotted and is no longer viable for HD. Patient counseled extensively this morning regarding overall prognosis by both medical and nephrology teams and seems to be leaning towards comfort care at this time. ?-Transition from amiodarone gtt to PO 400 mg BID x7 days, will decrease to 200 mg daily as HR stabilizes. ?-Will re-consult palliative care given new discussions re: hospice/comfort care ?-Continue midodrine 15 mg four times daily ?-If BP happens to normalize, can consider adding metoprolol 12.5 mg BID for RVR ?  ?#ESRD on HD MWF with anuria ?#Secondary hyperparathyroidism ?#Hypotension ?#Anasarca ?Patient no longer able to undergo HD due to clotted off AV fistula and lack of desire to have tunneled catheter placed. He expressed wishes to have further conversations with palliative/hospice for comfort measures.  ?-Nephrology following, unfortunately  ?-Trend renal function panel ?-Avoid nephrotoxic medications ?-Continue midodrine '15mg'$  four times daily ?  ?#Scrotal edema ?Scrotal edema unchanged, does not complain of pain at this time. ?-PT consulted for scrotal sling ?  ?#L AKA 2/2 non-healing wound on L BKA ?#Revision of R AKA ?POD #7 from revision of L BKA to AKA and revision of R AKA to higher R AKA. Afebrile without  leukocytosis, slight drop in Hgb to 7.9 from 8.4 03/16; he does say that he had a lot of bleeding from his fistula at HD yesterday and that it took a while to stop the bleed. ?-VVS following, appreciate their assistance ?- Trend CBC and fever curve ? ?Best Practice: ?Diet: Dysphagia 2 ?IVF: None ?VTE: Place and maintain sequential compression device Start: 10/14/21 0957 ?Code: DNR ?AB: None ?DISPO: Anticipated discharge in 3-4 days to [place] pending Medical stability and [place] discussions . ? ?Signature: ?Farrel Gordon, D.O.  ?Internal Medicine Resident, PGY-1 ?[facility]  ?Pager: [phone] ?7:44 AM, 10/23/2021  ? ?Please contact the on call pager after 5 pm and on weekends at [phone]. ? ?

## 2021-10-23 NOTE — Progress Notes (Signed)
Speech Language Pathology Treatment: Dysphagia  ?Patient Details ?Name: Scott Castro ?MRN: [id number] ?DOB: 03/05/1956 ?Today's Date: 10/23/2021 ?Time: 8889-1694 ?SLP Time Calculation (min) (ACUTE ONLY): 16 min ? ?Assessment / Plan / Recommendation ?Clinical Impression ? Pt says he has been coughing more frequently with PO intake still, and that he is not able to eat most if any of the solid foods on his tray because they are too hard and too dry. He is lying semi-reclined upon SLP arrival, eating ice cream and coughing. SLP provided educaiton on ice cream melting to a thin liquid during intake, and encouraged him to be more upright for meals. He is hesitatnt to sit up for PO intake due to fear of pain, but once agreeable he sat up more and used reverse Trendelenburg to get adequately upright. Once in this position, he was agreeable ontly to sips of water, which he consumed without overt s/s of aspiration but only in limited quantities before wanting to stop. Discussed diet options with pt, who would like to try Dys 2 diet to see if he can eat more solids this way. Plan for now after discussion with pt is to soften diet to try to facilitate PO intake, also trying to focus on positioning prior to initiating meals. SLP will continue to f/u with potential to advance to thin liquids with subjective improvements in ability to swallow and as his overall strength improves. ?  ?HPI HPI: Scott Castro is a 66 yo male presenting from [facility] on 10/13/21 after being seen in the office by vascular surgery for a non-healing wound on recent left surgical site (hx critical limb ischemia and bilateral BKA and R AKA). 3/10 underwent revision of left below-knee amputation with conversion to above-knee amputation and  revision of right above-knee amputation to higher above-knee amputation.  PMH includes ESRD on dialysis (19 years), hypertension, hyperlipidemia, PAF, depression, and chronic respiratory failure 2 L of oxygen,  Barrett's esophagus. Admitted for left BKA conversion to an AKA. Pt known to SLP from last admission in January, during which swallowing was evaluated due to complaints of pill dysphagia. Last EGD was August 2017 with confirmed findings of Barrett's esophagus. ?  ?   ?SLP Plan ? Continue with current plan of care ? ?  ?  ?Recommendations for follow up therapy are one component of a multi-disciplinary discharge planning process, led by the attending physician.  Recommendations may be updated based on patient status, additional functional criteria and insurance authorization. ?  ? ?Recommendations  ?Diet recommendations: Dysphagia 2 (fine chop);Nectar-thick liquid ?Liquids provided via: Cup;Straw ?Medication Administration:  (pills whole in tsp nectar or puree) ?Supervision: Patient able to self feed (set-up assistance) ?Compensations: Slow rate;Small sips/bites;Multiple dry swallows after each bite/sip ?Postural Changes and/or Swallow Maneuvers: Seated upright 90 degrees;Upright 30-60 min after meal  ?   ?    ?   ? ? ? ? Oral Care Recommendations: Oral care BID ?Follow Up Recommendations: Skilled nursing-short term rehab (<3 hours/day) ?Assistance recommended at discharge: Intermittent Supervision/Assistance ?SLP Visit Diagnosis: Dysphagia, pharyngeal phase (R13.13) ?Plan: Continue with current plan of care ? ? ? ? ?  ?  ? ? ?Osie Bond., M.A. CCC-SLP ?Acute Rehabilitation Services ?Pager [phone] ?Office [phone] ? ? ?10/23/2021, 9:30 AM ?

## 2021-10-23 NOTE — Progress Notes (Signed)
PT Cancellation Note ? ?Patient Details ?Name: Scott Castro ?MRN: [id number] ?DOB: [birth year] ? ? ?Cancelled Treatment:    Reason Eval/Treat Not Completed: Other (comment) (pt aware of medical frailty and at this time declining all therapy attempts and stating he wants to pursue more comfort approach. MD and RN made aware) ? ? ?Scott Castro ?10/23/2021, 10:55 AM ?Scott Castro, PT ?Acute Rehabilitation Services ?Pager: [phone] ?Office: [phone] ? ?

## 2021-10-23 NOTE — Progress Notes (Signed)
[facility] ?Progress Note ? ? OP HD: MWF [place] (was [employer] [facility])    ?    4h  400/600  60.3kg  2/2.5 bath  LUA AVG  Hep none ? - hect 3 ug tiw IV (dc'd here) ? - sodium thiosulfate 25 gm tiw IV  ? - home: renvela packet 2.4gm ac tid ?   ?Assessment/ Plan: ?Wound infection of BKA stump - IV abx per pmd. SP new L AKA and revision of R AKA 3/10 by VVS.  ?Hypotension - on midodrine 10 tid, ^ to 15 tid. BP's soft. Overall  very poor prognosis with his multitude of comorbidities + refractory hypotension already on midodrine.  Pt is DNR. Pt is not a candidate for CRRT, pressor support etc.  Appreciate palliative care seeing patient (they are very familiar with him from past hospitalizations as well); poor performance status as well.  ?Volume - certainly is in positive Na balance; had been sometimes tolerating some UF but not on 3/16. ?Afib RVR - per pmd and cards -> on amiodarone; options limited. ?ESRD - usually on HD MWF. HD 3/14 with net 1.5L. ? ?Unable to remove fluid on HD 3/16; actually +193m ? ?I had a lengthy discussion with him today as his body is truly breaking down, poor QOL, unable to UF much, now thrombosed LUA AVS. He would need VIR or VVS to place a TC for dialysis to continue. ? ?We spoke about spiritual matters as he was afraid to die understandably; he does not want the TC or further dialysis, wanting to just be made comfortable which is appropriate. ? ?I will still follow him but for now now planning on further dialysis. ? ?Anemia ckd - Hb 8.2 -> aranesp given 3/17 already. ?MBD ckd - Ca++ high and hx of calciphylaxis, hold any Ca/ vit D products. Phos in range, cont binder.  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 DC'd hectorol.  ?DNR ? ? ?Subjective: seen in room alone today; mildly SOB. Spent a long time discussing options, dialysis, end of life, etc and he now wants to be made comfortable. ? ?Vitals:  ? 10/23/21 0325 10/23/21 0438 10/23/21 0554  10/23/21 0726  ?BP: (!) 55/41 (!) 82/60  (!) 79/48  ?Pulse:  (!) 120    ?Resp: (!) '21 20  17  '$ ?Temp: 98.2 ?F (36.8 ?C) 97.7 ?F (36.5 ?C)    ?TempSrc: Oral Oral    ?SpO2:  97%    ?Weight:   66.6 kg   ?Height:      ? ? ?Exam: ?Gen alert, frail, nasal cann O2, no distress ?No jvd or bruits ?Chest clear bilat to bases ?RRR no RG ?Abd soft ntnd ?Ext bilat AKA, diffuse 1-2+ UE and LE stump edema + scrotal edema ?Neuro is alert, Ox 3 , nf ?LUA AVS is thrombosed. ?  ?  ? Home meds include - eliquis 5 bid, lipitor, neurontin 100 bid, midodrine 10 tid, zoloft,  ?  ?  CXR - 3/7- chronic changes, mild CM, no gross edema , small bilat effusions ?     ? ? ?Recent Labs  ?Lab 10/22/21 ?0154 10/23/21 ?[id number] ?K 4.8 4.0  ?BUN 21 12  ?CREATININE 2.89* 1.99*  ?ALBUMIN 2.2* 2.2*  ?CALCIUM 10.5* 9.4  ?PHOS 4.8* 3.1  ?HGB 8.4* 7.9*  ? ?Inpatient medications: ? sodium chloride   Intravenous Once  ? sodium chloride   Intravenous Once  ? acetaminophen  1,000 mg Oral TID  ? amiodarone  400 mg Oral BID  ? Followed by  ? [START ON 10/30/2021] amiodarone  200 mg Oral Daily  ? vitamin C  500 mg Oral BID  ? atorvastatin  10 mg Oral QHS  ? Chlorhexidine Gluconate Cloth  6 each Topical Q0600  ? darbepoetin (ARANESP) injection - DIALYSIS  150 mcg Intravenous Q Thu-HD  ? gabapentin  100 mg Oral BID  ? Gerhardt's butt cream   Topical Daily  ? midodrine  15 mg Oral QID  ? multivitamin  1 tablet Oral QHS  ? sertraline  100 mg Oral Daily  ? ? sodium chloride    ? sodium thiosulfate infusion for calciphylaxis 25 g (10/23/21 0259)  ? ?HYDROmorphone, methocarbamol, ondansetron **OR** ondansetron (ZOFRAN) IV, oxyCODONE, senna-docusate, simethicone ? ? ? ? ? ? ?

## 2021-10-23 NOTE — Progress Notes (Addendum)
? ?                                                                                                                                                     ?                                                   ?Daily Progress Note  ? ?Patient Name: Scott Castro       Date: 10/23/2021 ?DOB: June 04, 1956  Age: 66 y.o. MRN#: [id number] ?Attending Physician: Scott Groves, DO ?Primary Care Physician: Scott Bend, MD ?Admit Date: 10/13/2021 ? ?Reason for Consultation/Follow-up: Establishing goals of care ? ?Subjective: ?Notified by PMT RN that patient would benefit from follow up.  ? ?Chart review performed. Received report from primary RN - no acute concerns.  ? ?Went to visit patient at bedside - no family/visitors present. Patient was lying in bed awake, alert, oriented, and able to participate in conversation. His dinner tray had just been delivered. No signs or non-verbal gestures of pain or discomfort noted. No respiratory distress, increased work of breathing, or secretions noted. Patient denies pain or shortness of breath. He is tachycardic on monitor during my visit. ? ?Emotional support provided to patient. Therapeutic listening provided as he reviewed the difficult news/discussions he's had with the medical team today. He has a clear understanding that his fistula is no longer usable for dialysis treatments. We reviewed the option of pursuing a tunneled dialysis catheter in context of all his other underlying/acute medical conditions. Patient understands that even if he did pursue the tunneled catheter, dialysis would not offer him a better quality of life. Patient is clear he does not want to pursue tunneled catheter and by this decision understands he cannot received further dialysis treatments. Prognostication was reviewed per his request.  ? ?We talked about transition to comfort measures in house and what that would entail inclusive of medications to control pain, dyspnea, agitation, nausea, and itching. We  discussed stopping all unnecessary measures such as blood draws, needle sticks, oxygen, antibiotics, CBGs/insulin, cardiac monitoring, IVF, and frequent vital signs. Patient is agreeable for transition to full comfort care today. ? ?Patient does not want to continue Dys 3 diet - discussed regular diet with comfort feeds/aspiration precautions. Patient prefers to have regular diet. ? ?RN brought his evening medications - patient states "I hate taking pills." He laments on how difficult swallowing has become. Discussed stopping all oral medications and supporting symptoms with comfort medications via other routes - he is agreeable. ? ?Discussed option of residential hospice transfer. Provided education and counseling at length on the philosophy and benefits of hospice care. Discussed that it offers a  holistic approach to care in the setting of end-stage illness, and is about supporting the patient where they are allowing nature to take it's course. Discussed the hospice team includes RNs, physicians, social workers, and chaplains. They can provide personal care, support for the family, and help keep patient out of the hospital. Patient will consider hospice transfer tonight.  ? ?Patient stated he "loves orange juice" but hasn't been able to drink it. Reviewed with comfort care he is able to eat/drink as desires/tolerates. I brought patient a cup of OJ and he expressed appreciation. RN accompanied me back into the room per my request. Reviewed with her and patient that goal for transition to full comfort today and not to pursue tunneled catheter placement/further HD treatments - patient confirmed in front of RN. ? ?Patient is ok with me calling his cousin/Scott Castro to provide her with update on goal for comfort care today. ? ?Scott Castro - emotional support provided. Provided updates from conversation as outlined above. She is supportive of patient's wishes for full comfort today. Hospice questions answered. Scheduled  follow up in person meeting for tomorrow at 2p to continue hospice transfer discussion with patient and Cecille Rubin.  ? ?All questions and concerns addressed. Encouraged to call with questions and/or concerns. PMT number previously was provided. ? ? ?Length of Stay: 10 ? ?Current Medications: ?Scheduled Meds:  ? sodium chloride   Intravenous Once  ? sodium chloride   Intravenous Once  ? acetaminophen  1,000 mg Oral TID  ? amiodarone  400 mg Oral BID  ? Followed by  ? [START ON 10/30/2021] amiodarone  200 mg Oral Daily  ? vitamin C  500 mg Oral BID  ? atorvastatin  10 mg Oral QHS  ? Chlorhexidine Gluconate Cloth  6 each Topical Q0600  ? darbepoetin (ARANESP) injection - DIALYSIS  150 mcg Intravenous Q Thu-HD  ? gabapentin  100 mg Oral BID  ? Gerhardt's butt cream   Topical Daily  ? midodrine  15 mg Oral QID  ? multivitamin  1 tablet Oral QHS  ? sertraline  100 mg Oral Daily  ? ? ?Continuous Infusions: ? sodium chloride    ? sodium thiosulfate infusion for calciphylaxis 25 g (10/23/21 0259)  ? ? ?PRN Meds: ?HYDROmorphone, methocarbamol, ondansetron **OR** ondansetron (ZOFRAN) IV, oxyCODONE, senna-docusate, simethicone ? ?Physical Exam ?Vitals and nursing note reviewed.  ?Constitutional:   ?   General: He is not in acute distress. ?   Appearance: He is cachectic. He is ill-appearing.  ?Pulmonary:  ?   Effort: No respiratory distress.  ?Skin: ?   General: Skin is warm and dry.  ?Neurological:  ?   Mental Status: He is alert and oriented to person, place, and time.  ?   Motor: Weakness present.  ?Psychiatric:     ?   Attention and Perception: Attention normal.     ?   Behavior: Behavior is cooperative.     ?   Cognition and Memory: Cognition and memory normal.  ?         ? ?Vital Signs: BP (!) 79/48 (BP Location: Right Wrist)   Pulse (!) 120   Temp 97.7 ?F (36.5 ?C) (Oral)   Resp 17   Ht '5\' 9"'$  (1.753 m)   Wt 66.6 kg   SpO2 97%   BMI 21.68 kg/m?  ?SpO2: SpO2: 97 % ?O2 Device: O2 Device: Room Air ?O2 Flow Rate:    ? ?Intake/output summary:  ?Intake/Output Summary (Last 24 hours) at 10/23/2021 1604 ?  Last data filed at 10/23/2021 0600 ?Gross per 24 hour  ?Intake 818.65 ml  ?Output -186 ml  ?Net 1004.65 ml  ? ?LBM: Last BM Date : 10/20/21 ?Baseline Weight: Weight: 65 kg ?Most recent weight: Weight: 66.6 kg ? ?     ?Palliative Assessment/Data: PPS 20% ? ? ? ? ? ?Patient Active Problem List  ? Diagnosis Date Noted  ? Nonhealing surgical wound 10/13/2021  ? Diarrhea, unspecified 10/05/2021  ? Other disorders of calcium metabolism 09/28/2021  ? Allergy, unspecified, initial encounter 09/18/2021  ? Anaphylactic shock, unspecified, initial encounter 09/18/2021  ? Pain, unspecified 09/18/2021  ? Acquired absence of left leg below knee ([place]) 09/17/2021  ? Dependence on renal dialysis ([place]) 09/17/2021  ? Hypertensive chronic kidney disease with stage 5 chronic kidney disease or end stage renal disease ([place]) 09/17/2021  ? Iron deficiency anemia, unspecified 09/17/2021  ? Kidney transplant failure 09/17/2021  ? Long term (current) use of anticoagulants 09/17/2021  ? Personal history of COVID-19 09/17/2021  ? Pulmonary hypertension, unspecified ([place]) 09/17/2021  ? Renal osteodystrophy 09/17/2021  ? Secondary hyperparathyroidism of renal origin ([place]) 09/17/2021  ? Protein-calorie malnutrition, severe 09/15/2021  ? AF (paroxysmal atrial fibrillation) ([place]) 08/30/2021  ? Depressive reaction   ? S/P BKA (below knee amputation), right ([place]) 08/12/2021  ? Ischemic foot ulcer due to atherosclerosis of native artery of limb ([place]) 08/05/2021  ? Atrial fibrillation ([place]) 08/05/2021  ? Critical limb ischemia of left lower extremity/PAD  07/18/2021  ? Atrial fibrillation with RVR ([place]) 07/18/2021  ? Hypoalbuminemia due to protein-calorie malnutrition ([place]) 07/03/2021  ? Anemia 07/03/2021  ? PAD (peripheral artery disease) ([place]) 07/03/2021  ? GERD (gastroesophageal reflux disease) 07/03/2021  ? Mixed hyperlipidemia 07/03/2021  ? Bicipital tenosynovitis  12/04/2020  ? Localized, primary osteoarthritis of hand 12/04/2020  ? Hyperkalemia   ? ESRD on hemodialysis ([place]) 09/18/2019  ? Chronic respiratory failure with hypoxia ([place]) 09/17/2019  ? COPD (chronic obstructive pulmonary dis

## 2021-10-23 NOTE — Plan of Care (Signed)
?  Problem: Education: ?Goal: Knowledge of General Education information will improve ?Description: Including pain rating scale, medication(s)/side effects and non-pharmacologic comfort measures ?Outcome: Progressing ?  ?Problem: Pain Managment: ?Goal: General experience of comfort will improve ?Outcome: Not Progressing ?  ?Problem: Safety: ?Goal: Ability to remain free from injury will improve ?Outcome: Progressing ?  ?Problem: Skin Integrity: ?Goal: Risk for impaired skin integrity will decrease ?Outcome: Progressing ?  ?

## 2021-10-23 NOTE — Plan of Care (Signed)
?  Problem: Clinical Measurements: ?Goal: Will remain free from infection ?Outcome: Progressing ?Goal: Respiratory complications will improve ?Outcome: Progressing ?  ?Problem: Education: ?Goal: Knowledge of General Education information will improve ?Description: Including pain rating scale, medication(s)/side effects and non-pharmacologic comfort measures ?Outcome: Not Progressing ?  ?Problem: Health Behavior/Discharge Planning: ?Goal: Ability to manage health-related needs will improve ?Outcome: Not Progressing ?  ?Problem: Clinical Measurements: ?Goal: Ability to maintain clinical measurements within normal limits will improve ?Outcome: Not Progressing ?  ?

## 2021-10-24 NOTE — Progress Notes (Signed)
? ?                                                                                                                                                     ?                                                   ?Daily Progress Note  ? ?Patient Name: Scott Castro       Date: 10/24/2021 ?DOB: [birth year]  Age: 66 y.o. MRN#: [id number] ?Attending Physician: Lucious Groves, DO ?Primary Care Physician: Franciso Bend, MD ?Admit Date: 10/13/2021 ? ?Reason for Consultation/Follow-up: Disposition, Non pain symptom management, Pain control, Psychosocial/spiritual support, and Terminal Care ? ?Subjective: ?Chart review performed. Received report from primary RN - no acute concerns. ? ?Went to visit patient at bedside - cousin/Lori, Lori's husband/Rodney, and 1st cousin/Betty present. Patient was lying in bed awake, alert, oriented, and able to participate in conversation. No signs or non-verbal gestures of pain or discomfort noted. No respiratory distress, increased work of breathing, or secretions noted. He denies pain or shortness of breath. Reports feeling "good" today.  ? ?Emotional support provided. Patient expresses joy that he is able to eat/drink as desires and doesn't have to take pills any more (is still ok with taking amiodarone). Witnessed his attempt at drinking from straw - coughing noted after. Education provided on use of mouth swab with chosen liquid and how this can assist with keeping mouth moist with decreased risk of aspiration/discomfort - mouth swabs provided - patient expressed appreciation. ? ?Natural disease trajectory and expectations at EOL was reviewed. ? ?Discussed option of hospice transfer. Therapeutic listening provided as patient/family reflect on his "awful" time at rehab facility. Education provided on atmosphere of hospice home, which would include having his own room (no roommates) with calm and quiet space. After discussion, patient was agreeable for transfer to residential hospice - patient  requests [facility].  ? ?Patient and family express appreciation for visit today. ? ?All questions and concerns addressed. Encouraged to call with questions and/or concerns. PMT card provided. ? ?Length of Stay: 11 ? ?Current Medications: ?Scheduled Meds:  ? amiodarone  400 mg Oral BID  ? Followed by  ? [START ON 10/30/2021] amiodarone  200 mg Oral Daily  ? antiseptic oral rinse  15 mL Topical BID  ? Gerhardt's butt cream   Topical Daily  ? ? ?Continuous Infusions: ? ? ?PRN Meds: ?acetaminophen **OR** acetaminophen, [DISCONTINUED] glycopyrrolate **OR** glycopyrrolate **OR** glycopyrrolate, [DISCONTINUED] haloperidol **OR** haloperidol **OR** haloperidol lactate, HYDROmorphone (DILAUDID) injection, [DISCONTINUED] LORazepam **OR** LORazepam **OR** LORazepam, [DISCONTINUED] ondansetron **OR** ondansetron (ZOFRAN) IV, polyvinyl alcohol ? ?Physical Exam ?Vitals and nursing note reviewed.  ?  Constitutional:   ?   General: He is not in acute distress. ?   Appearance: He is cachectic. He is ill-appearing.  ?Pulmonary:  ?   Effort: No respiratory distress.  ?Skin: ?   General: Skin is warm and dry.  ?Neurological:  ?   Mental Status: He is alert and oriented to person, place, and time.  ?   Motor: Weakness present.  ?Psychiatric:     ?   Attention and Perception: Attention normal.     ?   Behavior: Behavior is cooperative.     ?   Cognition and Memory: Cognition and memory normal.  ?         ? ?Vital Signs: BP (!) 99/59 (BP Location: Right Wrist)   Pulse (!) 139   Temp 98.4 ?F (36.9 ?C) (Oral)   Resp 16   Ht '5\' 9"'$  (1.753 m)   Wt 66.6 kg   SpO2 93%   BMI 21.68 kg/m?  ?SpO2: SpO2: 93 % ?O2 Device: O2 Device: Room Air ?O2 Flow Rate:   ? ?Intake/output summary:  ?Intake/Output Summary (Last 24 hours) at 10/24/2021 1340 ?Last data filed at 10/24/2021 1139 ?Gross per 24 hour  ?Intake 484.35 ml  ?Output 0 ml  ?Net 484.35 ml  ? ?LBM: Last BM Date : 10/20/21 ?Baseline Weight: Weight: 65 kg ?Most recent weight: Weight: 66.6  kg ? ?     ?Palliative Assessment/Data: PPS 20-30% ? ? ? ? ? ?Patient Active Problem List  ? Diagnosis Date Noted  ? Nonhealing surgical wound 10/13/2021  ? Diarrhea, unspecified 10/05/2021  ? Other disorders of calcium metabolism 09/28/2021  ? Allergy, unspecified, initial encounter 09/18/2021  ? Anaphylactic shock, unspecified, initial encounter 09/18/2021  ? Pain, unspecified 09/18/2021  ? Acquired absence of left leg below knee ([place]) 09/17/2021  ? Dependence on renal dialysis ([place]) 09/17/2021  ? Hypertensive chronic kidney disease with stage 5 chronic kidney disease or end stage renal disease ([place]) 09/17/2021  ? Iron deficiency anemia, unspecified 09/17/2021  ? Kidney transplant failure 09/17/2021  ? Long term (current) use of anticoagulants 09/17/2021  ? Personal history of COVID-19 09/17/2021  ? Pulmonary hypertension, unspecified ([place]) 09/17/2021  ? Renal osteodystrophy 09/17/2021  ? Secondary hyperparathyroidism of renal origin ([place]) 09/17/2021  ? Protein-calorie malnutrition, severe 09/15/2021  ? AF (paroxysmal atrial fibrillation) ([place]) 08/30/2021  ? Depressive reaction   ? S/P BKA (below knee amputation), right ([place]) 08/12/2021  ? Ischemic foot ulcer due to atherosclerosis of native artery of limb ([place]) 08/05/2021  ? Atrial fibrillation ([place]) 08/05/2021  ? Critical limb ischemia of left lower extremity/PAD  07/18/2021  ? Atrial fibrillation with RVR ([place]) 07/18/2021  ? Hypoalbuminemia due to protein-calorie malnutrition ([place]) 07/03/2021  ? Anemia 07/03/2021  ? PAD (peripheral artery disease) ([place]) 07/03/2021  ? GERD (gastroesophageal reflux disease) 07/03/2021  ? Mixed hyperlipidemia 07/03/2021  ? Bicipital tenosynovitis 12/04/2020  ? Localized, primary osteoarthritis of hand 12/04/2020  ? Hyperkalemia   ? ESRD on hemodialysis ([place]) 09/18/2019  ? Chronic respiratory failure with hypoxia ([place]) 09/17/2019  ? COPD (chronic obstructive pulmonary disease) ([place]) 04/10/2019  ? Nonintractable epilepsy with  complex partial seizures ([place]) 08/20/2014  ? Lung nodule, multiple 07/28/2012  ? Arteriovenous fistula ([place]) 01/21/2012  ? At risk for dental problems 01/21/2012  ? Barrett esophagus 01/21/2012  ? FSGS (focal segmental glomerulosclerosis) 01/21/2012  ? Kidney transplanted 01/21/2012  ? ? ?Palliative Care Assessment & Plan  ? ?Patient Profile: ?CINDY BRINDISI is a 66 year old male with a  past medical history of paroxysmal atrial fibrillation on eliquis, ESRD on HD MWF (19 years) s/p failed kidney transplant, Barrett's esophagus, and critical limb ischemia leading to bilateral BKA and R AKA, who presented with a non-healing wounds leading to revision of L BKA to AKA and R AKA to higher R AKA. ? ?Assessment: ?Paroxysmal Atrial Fibrillation w/ RVR ?LV dysfunction EF 30-35%  ?Biventricular HF  ?Severe pulmonary HTN ?Cardiomyopathy ?ESRD on HD with anuria ?Secondary hyperparathyroidism ?Anasarca ?Scrotal edema ?Wound infection ?Hypotension ?Atrial fibrillation with RVR ?Terminal care ? ?Recommendations/Plan: ?Continue full comfort measures ?Continue DNR/DNI as previously documented - durable DNR form completed and placed in shadow chart ?Transfer to [facility] when bed available - evaluation pending ?TOC consulted for: residential hospice referral. TOC and [facility] liaison aware ?Continue palliative wound care ?Continue amiodarone while tolerating ?Continue other comfort focused medication regimen - patient is comfortable ?Nursing to provide frequent assessments and administer PRN medications as clinically necessary to ensure EOL comfort ?PMT will continue to follow and support holistically ? ? ?Goals of Care and Additional Recommendations: ?Limitations on Scope of Treatment: Full Comfort Care ? ?Code Status: ? ?  ?Code Status Orders  ?(From admission, onward)  ?  ? ? ?  ? ?  Start     Ordered  ? 10/23/21 1742  Do not attempt resuscitation (DNR)  Continuous       ?Question Answer Comment  ?In the event of cardiac or  respiratory ARREST Do not call a ?code blue?   ?In the event of cardiac or respiratory ARREST Do not perform Intubation, CPR, defibrillation or ACLS   ?In the event of cardiac or respiratory ARREST Use medication by any route, positio

## 2021-10-24 NOTE — Progress Notes (Signed)
? ?Subjective:  ? ?Hospital day: 79 ? ?Overnight event: Patient was transition to comfort care yesterday. ? ?Patient evaluated at the bedside laying comfortably in bed. Affect is slightly flat with depressed mood. He does not have any acute complaints. Informed team that his cousin was likely coming tomorrow. Patient was informed of plan to transfer him to the comfort care floor. ? ?Objective: ? ?Vital signs in last 24 hours: ?Vitals:  ? 10/24/21 0754 10/24/21 0800 10/24/21 1137 10/24/21 1400  ?BP: (!) 74/63 (!) 75/59 (!) 99/59 (!) 59/42  ?Pulse: (!) 121 (!) 120 (!) 139 (!) 132  ?Resp: (!) '23 17 16 18  '$ ?Temp: 98.4 ?F (36.9 ?C) 98.4 ?F (36.9 ?C) 98.4 ?F (36.9 ?C) 98 ?F (36.7 ?C)  ?TempSrc: Oral Oral Oral Oral  ?SpO2:  93% 93% 99%  ?Weight:      ?Height:      ? ? ?Filed Weights  ? 10/21/21 0546 10/22/21 0500 10/23/21 0554  ?Weight: 66.5 kg 66.3 kg 66.6 kg  ? ? ? ?Intake/Output Summary (Last 24 hours) at 10/24/2021 1431 ?Last data filed at 10/24/2021 1139 ?Gross per 24 hour  ?Intake 484.35 ml  ?Output 0 ml  ?Net 484.35 ml  ? ?Net IO Since Admission: 10,141.37 mL [10/24/21 1431] ? ?No results for input(s): GLUCAP in the last 72 hours.  ? ?Pertinent Labs: ?CBC Latest Ref Rng & Units 10/23/2021 10/22/2021 10/21/2021  ?WBC 4.0 - 10.5 K/uL 5.3 5.9 4.9  ?Hemoglobin 13.0 - 17.0 g/dL 7.9(L) 8.4(L) 8.2(L)  ?Hematocrit 39.0 - 52.0 % 24.1(L) 26.6(L) 24.7(L)  ?Platelets 150 - 400 K/uL 116(L) 120(L) 78(L)  ? ? ?CMP Latest Ref Rng & Units 10/23/2021 10/22/2021 10/21/2021  ?Glucose 70 - 99 mg/dL 122(H) 85 109(H)  ?BUN 8 - 23 mg/dL '12 21 13  '$ ?Creatinine 0.61 - 1.24 mg/dL 1.99(H) 2.89(H) 1.88(H)  ?Sodium 135 - 145 mmol/L 137 134(L) 136  ?Potassium 3.5 - 5.1 mmol/L 4.0 4.8 4.1  ?Chloride 98 - 111 mmol/L 99 97(L) 96(L)  ?CO2 22 - 32 mmol/L '27 24 24  '$ ?Calcium 8.9 - 10.3 mg/dL 9.4 10.5(H) 9.8  ?Total Protein 6.5 - 8.1 g/dL - - 5.2(L)  ?Total Bilirubin 0.3 - 1.2 mg/dL - - 1.2  ?Alkaline Phos 38 - 126 U/L - - 110  ?AST 15 - 41 U/L - - 17  ?ALT 0 -  44 U/L - - 9  ? ? ?Imaging: ?No results found. ? ?Physical Exam ? ?General: Chronically ill, frail elderly man laying in bed. No acute distress. ?CV: Tachycardic. Regular rhythm ?Psych: Flat affect. Depressed mood ? ?Assessment/Plan: ?Scott Castro is a 66 y.o. male with hx of paroxysmal atrial fibrillation on eliquis, ESRD on HD MWF (19 years) s/p failed kidney transplant, Barrett's esophagus, and critical limb ischemia leading to bilateral BKA and R AKA, who presented with a non-healing wounds leading to revision of L BKA to AKA and R AKA to higher R AKA on 3/10.  Hospital course complicated by A-fib, hypotension, scrotal swelling, tachycardia and thrombosed LUA AVG. Patient currently on comfort care due to inability to continue dialysis. ? ?Principal Problem: ?  Nonhealing surgical wound ?Active Problems: ?  ESRD on hemodialysis ([place]) ? ?Paroxysmal Atrial Fibrillation w/ RVR ?LV dysfunction EF 30-35%  ?Biventricular HF  ?Severe pulmonary HTN ?Cardiomyopathy ?ESRD on HD with anuria ?Secondary hyperparathyroidism ?Anasarca ?Scrotal edema ?Wound infection ?Hypotension ?Atrial fibrillation with RVR ?#Comfort care ?Palliative care had a lengthy discussion with patient on 3/17 concerning his thrombosed access site and  his options for dialysis. Patient informed palliative care that he is not interested in pursuing placement of tunneled dialysis catheter and thus would not be able to continue dialysis as patient is not a candidate for CRRT.  Decision was made to transition patient to comfort care on 3/17. ?--Comfort care orders per palliative care, appreciate assistance ?--Meeting with POA today at 2 PM ?--Transfer patient to comfort care floor ?--Pending discharge to home with hospice versus [facility] ?--Continue DNR order ? ?Prior to Admission Living Arrangement: SNF ?Anticipated Discharge Location: Pending ?Barriers to Discharge: Family meeting ?Dispo: Anticipated discharge in approximately 1-2 day(s).   ? ?Signed: ?Lacinda Axon, MD ?10/24/2021, 2:31 PM  ?Pager: [phone] ?Internal Medicine Teaching Service ?After 5pm on weekdays and 1pm on weekends: On Call pager: [phone] ? ?

## 2021-10-24 NOTE — Progress Notes (Signed)
Manufacturing engineer [facility]) Hospital Liaison Note ? ?Received request from [employer] [employer] [place]  for family interest in [facility]. Visited patient at bedside and spoke with patient & family to confirm interest and explain services. ? ?Approval for [facility] is determined by [facility] MD. Once [facility] MD has determined [facility] eligibility, [facility] will update hospital staff and family. ? ?Please do not hesitate to call with any hospice related questions.  ?  ?Thank you for the opportunity to participate in this patient's care. ? ?Daphene Calamity, MSW ?[facility] Liaison  ?[phone] ? ?

## 2021-10-25 DIAGNOSIS — R52 Pain, unspecified: Secondary | ICD-10-CM

## 2021-10-25 MED ORDER — HYDROMORPHONE HCL 1 MG/ML IJ SOLN: 0.5000 mg | Freq: Two times a day (BID) | INTRAMUSCULAR | Status: DC

## 2021-10-25 MED ORDER — HYDROMORPHONE HCL 1 MG/ML IJ SOLN
0.5000 mg | INTRAMUSCULAR | Status: DC | PRN
  Administered 2021-10-25: 0.5 mg via INTRAVENOUS
  Administered 2021-10-25: 1 mg via INTRAVENOUS
  Filled 2021-10-25: qty 1

## 2021-10-25 MED ORDER — HYDROMORPHONE HCL 1 MG/ML IJ SOLN
0.5000 mg | Freq: Once | INTRAMUSCULAR | Status: DC
  Filled 2021-10-25: qty 0.5

## 2021-10-25 MED ORDER — HYDROMORPHONE HCL 1 MG/ML IJ SOLN: 1.0000 mg | Freq: Two times a day (BID) | INTRAMUSCULAR | Status: DC

## 2021-10-25 MED ORDER — AMIODARONE HCL 400 MG PO TABS: ORAL_TABLET | ORAL | 0 refills | Status: AC

## 2021-10-25 NOTE — Progress Notes (Signed)
Manufacturing engineer (ACC) ? ?Scott Castro is approved for [facility] and we have a bed to offer him today. ? ?All necessary forms are completed and [facility] is ready to accept [name]. ? ?RN staff, please call report to [phone] at any time. Also, please call Cecille Rubin (number on facesheet) to let her know when transport has arrived so she can plan to meet him over at [facility] lives out of town and will just wait to come see him once he is settled at [facility]. ? ?Once d/c summary is completed, please fax to [phone]. ? ?Thank you, ?Venia Carbon BSN, RN ?[facility] Liaison  ?

## 2021-10-25 NOTE — Progress Notes (Signed)
Chaplain responded to [facility] for EOL.  Pt is struggling with recent amputations and news of EOL status. Chaplain facilitated storytelling and provided ministry of presence.  Offered prayer and presented prayer shawl and Bible, which pt appreciated. Chaplain is grateful for the bedside support of Dr. Truman Hayward and med student Tiffany Kocher for their attention to the whole patient, including pt's spiritual struggles.   ? ?Please contact as needed for ongoing support. ? ?Minus Liberty, Chaplain ?Pager:  [phone] ? ? ? 10/25/21 1600  ?Clinical Encounter Type  ?Visited With Patient  ?Visit Type Initial;Spiritual support  ?Referral From Palliative care team  ?Consult/Referral To Chaplain  ?Spiritual Encounters  ?Spiritual Needs Prayer;Sacred text ?(Prayer shawl)  ?Stress Factors  ?Patient Stress Factors Major life changes  ? ? ?

## 2021-10-25 NOTE — Progress Notes (Signed)
Pt discharged to [facility] this pm ?

## 2021-10-25 NOTE — Discharge Summary (Signed)
? ?Name: Scott Castro ?MRN: [id number] ?DOB: 01-22-1956 66 y.o. ?PCP: Franciso Bend, MD ? ?Date of Admission: 10/13/2021 12:39 PM ?Date of Discharge: 10/25/2021 ?Attending Physician: Dr. Cain Sieve  ? ?Discharge Diagnosis: ?Principal Problem: ?  Nonhealing surgical wound ?Active Problems: ?  ESRD on hemodialysis ([place]) ?Paroxysmal Atrial Fibrillation w/ RVR ?Biventricular HF ?Severe pulmonary edema ?Hypotension ?Scrotal edema ?Anasarca ?Bilateral AKA ? ?Discharge Medications: ?Allergies as of 10/25/2021   ? ?   Reactions  ? Heparin Anaphylaxis  ? HIT serology sent 3/12  ? ?  ? ?  ?Medication List  ?  ? ?STOP taking these medications   ? ?albuterol 108 (90 Base) MCG/ACT inhaler ?Commonly known as: VENTOLIN HFA ?  ?apixaban 5 MG Tabs tablet ?Commonly known as: ELIQUIS ?  ?atorvastatin 10 MG tablet ?Commonly known as: LIPITOR ?  ?bisacodyl 10 MG suppository ?Commonly known as: DULCOLAX ?  ?diltiazem 120 MG 24 hr capsule ?Commonly known as: CARDIZEM CD ?  ?feeding supplement (NEPRO CARB STEADY) Liqd ?  ?gabapentin 100 MG capsule ?Commonly known as: NEURONTIN ?  ?hydrocortisone 25 MG suppository ?Commonly known as: ANUSOL-HC ?  ?meropenem  IVPB ?Commonly known as: MERREM ?  ?metoprolol tartrate 25 MG tablet ?Commonly known as: LOPRESSOR ?  ?midodrine 10 MG tablet ?Commonly known as: PROAMATINE ?  ?multivitamin Tabs tablet ?  ?oxyCODONE 5 MG immediate release tablet ?Commonly known as: Oxy IR/ROXICODONE ?  ?pantoprazole 40 MG tablet ?Commonly known as: PROTONIX ?  ?polyethylene glycol 17 g packet ?Commonly known as: MIRALAX / GLYCOLAX ?  ?senna-docusate 8.6-50 MG tablet ?Commonly known as: Senokot-S ?  ?sertraline 100 MG tablet ?Commonly known as: ZOLOFT ?  ?sevelamer carbonate 800 MG tablet ?Commonly known as: RENVELA ?  ?witch hazel-glycerin pad ?Commonly known as: TUCKS ?  ? ?  ? ?TAKE these medications   ? ?acetaminophen 325 MG tablet ?Commonly known as: TYLENOL ?Take 2 tablets (650 mg total) by mouth every 6 (six) hours as  needed for mild pain or moderate pain. ?  ?amiodarone 400 MG tablet ?Commonly known as: PACERONE ?Take 1 tablet (400 mg total) by mouth 2 (two) times daily for 5 days, THEN 0.5 tablets (200 mg total) daily. ?Start taking on: October 25, 2021 ?  ? ?  ? ? ?Disposition and follow-up:   ?Mr.Scott Castro was discharged from [facility] in [place] condition.  At the hospital follow up visit please address: ? ?1.  Comfort care: Start comfort care medications per your facility protocol.  Continue patient's amiodarone at the discretion of the accepting provider. ?  ?2.  Labs / imaging needed at time of follow-up: None ? ?3.  Pending labs/ test needing follow-up: None ? ?Follow-up Appointments: ?None ? ?Hospital Course by problem list: ?Patient admitted on 3/7 for non-healing wound on left leg requiring revision from BKA to AKA. He was started on cefepime and vancomycin in the ED. He also received 2L IV fluids in the ED. Started on diltiazem drip in the ED. Started on heparin drip prior to surgery. Patient had revision of L BKA to L AKA and revision of R AKA to higher AKA on 6/76 without complication. Received two units pRBC on 3/11 due to continued bleeding. Increased midodrine dose on 3/13 due to hypotension. Patient received doses of vancomycin with iHD on 3/9 and 3/11. Last dose of cefepime was on 3/12 for a total of 6 days. Heparin discontinued on 3/12 due to ongoing bleeding and thrombocytopenia. Diltiazem discontinued on 3/13 due  to hypotension. Palliative care consulted. Echocardiogram on 3/14 showed cardiomyopathy with LV EF 30-35%. CXR on 3/14 showed unchanged pleural effusions and heterogenous airspace opacity. Chest CT on 3/15 showed  in patchy and nodular ground-glass and consolidative opacity in RLL; development of patchy ground-glass attenuation in LUL suggestive of infectious/inflammatory process; otherwise stable appearance of loculated pleural effusion. Started on amiodarone drip on 3/15.  Spiritual care consulted. Transitioned to oral amiodarone on 3/17. Palliative care had a lengthy discussion with patient on 3/17 concerning his thrombosed access site and his options for dialysis. Patient informed palliative care that he is not interested in pursuing placement of tunneled dialysis catheter and thus would not be able to continue dialysis as patient is not a candidate for CRRT. Decision was made to transition patient to comfort care on 3/17. Palliative care had a discussion with patient on 3/18 about hospice transfer. Patient accepted at [facility] on 3/19.  ? ?  ?Subjective: ?Patient seen and evaluated at bedside. He was laying comfortably in bed eating breakfast. He reports feeling good today. He was unable to open his packet of honey and his cheerios due to weakness. He reported having increased leg pain. He said that his cousin might be coming by later this afternoon. Spoke with patient about spiritual matters. Provided emotional support. ? ?Objective: ?Discharge Vitals:   ?BP (!) 90/51 (BP Location: Right Arm)   Pulse 100   Temp 98.2 ?F (36.8 ?C)   Resp 18   Ht '5\' 9"'$  (1.753 m)   Wt 66.6 kg   SpO2 95%   BMI 21.68 kg/m?  ? ?Discharge exam: ?General appearance: Chronically ill-appearing, malnourished man laying in bed. No acute distress. ?Heart: Tachycardic ?Abdomen: soft, non-tender; bowel sounds normal; no masses,  no organomegaly ?Skin: Diffuse ecchymoses on bilateral upper extremities  ?Psych: Flat affect.  ? ?Pertinent Labs, Studies, and Procedures:  ?CBC Latest Ref Rng & Units 10/23/2021 10/22/2021 10/21/2021  ?WBC 4.0 - 10.5 K/uL 5.3 5.9 4.9  ?Hemoglobin 13.0 - 17.0 g/dL 7.9(L) 8.4(L) 8.2(L)  ?Hematocrit 39.0 - 52.0 % 24.1(L) 26.6(L) 24.7(L)  ?Platelets 150 - 400 K/uL 116(L) 120(L) 78(L)  ? ? ?CMP Latest Ref Rng & Units 10/23/2021 10/22/2021 10/21/2021  ?Glucose 70 - 99 mg/dL 122(H) 85 109(H)  ?BUN 8 - 23 mg/dL '12 21 13  '$ ?Creatinine 0.61 - 1.24 mg/dL 1.99(H) 2.89(H) 1.88(H)  ?Sodium 135 -  145 mmol/L 137 134(L) 136  ?Potassium 3.5 - 5.1 mmol/L 4.0 4.8 4.1  ?Chloride 98 - 111 mmol/L 99 97(L) 96(L)  ?CO2 22 - 32 mmol/L '27 24 24  '$ ?Calcium 8.9 - 10.3 mg/dL 9.4 10.5(H) 9.8  ?Total Protein 6.5 - 8.1 g/dL - - 5.2(L)  ?Total Bilirubin 0.3 - 1.2 mg/dL - - 1.2  ?Alkaline Phos 38 - 126 U/L - - 110  ?AST 15 - 41 U/L - - 17  ?ALT 0 - 44 U/L - - 9  ? ? ?MR TIBIA FIBULA LEFT WO CONTRAST ? ?Result Date: 10/13/2021 ?CLINICAL DATA:  Soft tissue infection suspected. Nonhealing wound on left surgical site. Concern for infection. EXAM: MRI OF LOWER LEFT EXTREMITY WITHOUT CONTRAST TECHNIQUE: Multiplanar, multisequence MR imaging of the left knee was performed. No intravenous contrast was administered. COMPARISON:  None available FINDINGS: Bones/Joint/Cartilage Postsurgical changes are noted of left below-the-knee amputation. There is metallic artifact from likely surgical skin staples at the distal anterior aspect of the amputation site. There is heterogeneous predominantly decreased T1 and heterogeneous both decreased and increased T2 signal within the  soft tissues at the distal aspect of the stump including the soleus musculature. There is mild marrow edema within the distal aspect of the remaining tibial diaphysis, very faint but possibly extending up to 4 cm proximal to the distal amputation edge. There is no significant cortication of the distal tibial amputation edge and this surgery may be recent. Ligaments The ACL and PCL appear intact. The visualized portions of the medial collateral ligament and fibular collateral ligament appear intact. Muscles and Tendons There is diffuse edema seen throughout the visualized posterior thigh hamstring musculature. Soft tissues There is moderate edema and swelling of the subcutaneous fat of the medial and anterolateral greater than posterolateral thigh and proximal calf. No walled-off fluid collection is seen. IMPRESSION: 1. Left below-knee amputation. There is moderate  subcutaneous fat edema and swelling of the left leg. Heterogeneous signal within the soft tissues distal to the amputation stump are suspicious for an infectious/inflammatory process, possible cellulitis. There is subtle marrow

## 2021-10-25 NOTE — TOC Transition Note (Signed)
Transition of Care (TOC) - CM/SW Discharge Note ? ? ?Patient Details  ?Name: Scott Castro ?MRN: [id number] ?Date of Birth: 04-Dec-1955 ? ?Transition of Care (TOC) CM/SW Contact:  ?Carolin Sicks, LCSWA ?Phone Number: ?10/25/2021, 3:31 PM ? ? ?Clinical Narrative:    ?Patient will Discharge To: [facility] ?Anticipated DC Date:10/25/21 ?Family Notified:yes,  Maximiano Coss [phone] ?Transport EL:[facility]BO ? ? ?Per MD patient ready for DC to [facility] . RN, patient, patient's family, and facility notified of DC. Assessment, Fl2/Pasrr, and Discharge Summary sent to facility. RN given number for report [phone]). DC packet on chart. Ambulance transport requested for patient.  ? ?CSW signing off. ? ?Reed Breech ?LCSWA ?[phone] ?  ? ? ?Final next level of care: [place] ?Barriers to Discharge: No Barriers Identified ? ? ?Patient Goals and CMS Choice ?Patient states their goals for this hospitalization and ongoing recovery are:: Rehab ?CMS Medicare.gov Compare Post Acute Care list provided to:: Patient ?Choice offered to / list presented to : Patient ? ?Discharge Placement ?  ?           ?Patient chooses bed at:  [facility] [place]) ?Patient to be transferred to facility by: [facility] ?Name of family member notified: Christia Reading ?Patient and family notified of of transfer: 10/25/21 ? ?Discharge Plan and Services ?In-house Referral: Clinical Social Work ?  ?           ?  ?  ?  ?  ?  ?  ?  ?  ?  ?  ? ?Social Determinants of Health (SDOH) Interventions ?  ? ? ?Readmission Risk Interventions ?Readmission Risk Prevention Plan 07/22/2021 04/10/2019  ?Transportation Screening Complete Complete  ?PCP or Specialist Appt within 3-5 Days Complete Complete  ?[place] or Home Care Consult Complete Complete  ?Social Work Consult for [place] Planning/Counseling Complete Complete  ?Palliative Care Screening Not Applicable -  ?Medication Review Press photographer) Complete Complete  ?Some recent data might be hidden   ? ? ? ? ? ?

## 2021-10-25 NOTE — Progress Notes (Addendum)
? ?                                                                                                                                                     ?                                                   ?Daily Progress Note  ? ?Patient Name: Scott Castro       Date: 10/25/2021 ?DOB: [birth year]  Age: 66 y.o. MRN#: [id number] ?Attending Physician: Scott Groves, DO ?Primary Care Physician: Scott Bend, MD ?Admit Date: 10/13/2021 ? ?Reason for Consultation/Follow-up: Establishing goals of care, Non pain symptom management, Pain control, Psychosocial/spiritual support, and Terminal Care ? ?Subjective: ?Chart review performed. Received report from primary RN - no acute concerns. Evaluation from [facility] is still pending. ? ?Went to visit patient at bedside - friends, Scott Castro and Scott Castro, were present. Patient was awake, alert, oriented, and able to participate in conversation. No signs or non-verbal gestures of pain or discomfort noted. No respiratory distress, increased work of breathing, or secretions noted. Patient does endorse 4/10 pain in his legs and at times 7/10. He received dose of dilaudid about 1 hour ago but feels due to sharp pain could use additional dose for pain. Otherwise, he reports "feeling good" today. ? ?We discussed still waiting for evaluation from hospice.  ? ?All questions and concerns addressed. Encouraged to call with questions and/or concerns. PMT card previously provided. ? ?2:03 PM ?TOC and ACC liaison provide update that patient has been accepted to [facility] - working on getting consents completed. ? ?Length of Stay: 12 ? ?Current Medications: ?Scheduled Meds:  ? amiodarone  400 mg Oral BID  ? Followed by  ? [START ON 10/30/2021] amiodarone  200 mg Oral Daily  ? antiseptic oral rinse  15 mL Topical BID  ? Gerhardt's butt cream   Topical Daily  ? ? ?Continuous Infusions: ? ? ?PRN Meds: ?acetaminophen **OR** acetaminophen, [DISCONTINUED] glycopyrrolate **OR** glycopyrrolate **OR**  glycopyrrolate, [DISCONTINUED] haloperidol **OR** haloperidol **OR** haloperidol lactate, HYDROmorphone (DILAUDID) injection, [DISCONTINUED] LORazepam **OR** LORazepam **OR** LORazepam, [DISCONTINUED] ondansetron **OR** ondansetron (ZOFRAN) IV, polyvinyl alcohol ? ?Physical Exam ?Vitals and nursing note reviewed.  ?Constitutional:   ?   General: He is not in acute distress. ?   Appearance: He is cachectic. He is ill-appearing.  ?Pulmonary:  ?   Effort: No respiratory distress.  ?Skin: ?   General: Skin is warm and dry.  ?Neurological:  ?   Mental Status: He is alert and oriented to person, place, and time.  ?   Motor: Weakness present.  ?Psychiatric:     ?   Attention and  Perception: Attention normal.     ?   Behavior: Behavior is cooperative.     ?   Cognition and Memory: Cognition and memory normal.  ?         ? ?Vital Signs: BP (!) 90/51 (BP Location: Right Arm)   Pulse 100   Temp 98.2 ?F (36.8 ?C)   Resp 18   Ht '5\' 9"'$  (1.753 m)   Wt 66.6 kg   SpO2 95%   BMI 21.68 kg/m?  ?SpO2: SpO2: 95 % ?O2 Device: O2 Device: Room Air ?O2 Flow Rate:   ? ?Intake/output summary:  ?Intake/Output Summary (Last 24 hours) at 10/25/2021 0956 ?Last data filed at 10/24/2021 1139 ?Gross per 24 hour  ?Intake 200 ml  ?Output --  ?Net 200 ml  ? ?LBM: Last BM Date : 10/20/21 ?Baseline Weight: Weight: 65 kg ?Most recent weight: Weight: 66.6 kg ? ?     ?Palliative Assessment/Data: PPS 20% ? ? ? ? ? ?Patient Active Problem List  ? Diagnosis Date Noted  ? Nonhealing surgical wound 10/13/2021  ? Diarrhea, unspecified 10/05/2021  ? Other disorders of calcium metabolism 09/28/2021  ? Allergy, unspecified, initial encounter 09/18/2021  ? Anaphylactic shock, unspecified, initial encounter 09/18/2021  ? Pain, unspecified 09/18/2021  ? Acquired absence of left leg below knee ([place]) 09/17/2021  ? Dependence on renal dialysis ([place]) 09/17/2021  ? Hypertensive chronic kidney disease with stage 5 chronic kidney disease or end stage renal disease ([place])  09/17/2021  ? Iron deficiency anemia, unspecified 09/17/2021  ? Kidney transplant failure 09/17/2021  ? Long term (current) use of anticoagulants 09/17/2021  ? Personal history of COVID-19 09/17/2021  ? Pulmonary hypertension, unspecified ([place]) 09/17/2021  ? Renal osteodystrophy 09/17/2021  ? Secondary hyperparathyroidism of renal origin ([place]) 09/17/2021  ? Protein-calorie malnutrition, severe 09/15/2021  ? AF (paroxysmal atrial fibrillation) ([place]) 08/30/2021  ? Depressive reaction   ? S/P BKA (below knee amputation), right ([place]) 08/12/2021  ? Ischemic foot ulcer due to atherosclerosis of native artery of limb ([place]) 08/05/2021  ? Atrial fibrillation ([place]) 08/05/2021  ? Critical limb ischemia of left lower extremity/PAD  07/18/2021  ? Atrial fibrillation with RVR ([place]) 07/18/2021  ? Hypoalbuminemia due to protein-calorie malnutrition ([place]) 07/03/2021  ? Anemia 07/03/2021  ? PAD (peripheral artery disease) ([place]) 07/03/2021  ? GERD (gastroesophageal reflux disease) 07/03/2021  ? Mixed hyperlipidemia 07/03/2021  ? Bicipital tenosynovitis 12/04/2020  ? Localized, primary osteoarthritis of hand 12/04/2020  ? Hyperkalemia   ? ESRD on hemodialysis ([place]) 09/18/2019  ? Chronic respiratory failure with hypoxia ([place]) 09/17/2019  ? COPD (chronic obstructive pulmonary disease) ([place]) 04/10/2019  ? Nonintractable epilepsy with complex partial seizures ([place]) 08/20/2014  ? Lung nodule, multiple 07/28/2012  ? Arteriovenous fistula ([place]) 01/21/2012  ? At risk for dental problems 01/21/2012  ? Barrett esophagus 01/21/2012  ? FSGS (focal segmental glomerulosclerosis) 01/21/2012  ? Kidney transplanted 01/21/2012  ? ? ?Palliative Care Assessment & Plan  ? ?Patient Profile: ?Scott Castro is a 66 year old male with a past medical history of paroxysmal atrial fibrillation on eliquis, ESRD on HD MWF (19 years) s/p failed kidney transplant, Barrett's esophagus, and critical limb ischemia leading to bilateral BKA and R AKA, who presented with a  non-healing wounds leading to revision of L BKA to AKA and R AKA to higher R AKA. ? ?Assessment: ?Paroxysmal Atrial Fibrillation w/ RVR ?LV dysfunction EF 30-35%  ?Biventricular HF  ?Severe pulmonary HTN ?Cardiomyopathy ?ESRD on HD with anuria ?Secondary hyperparathyroidism ?Anasarca ?Scrotal edema ?Wound  infection ?Hypotension ?Atrial fibrillation with RVR ?Terminal care ? ?Recommendations/Plan: ?Continue full comfort measures ?Transfer to [facility] has approved for transfer when their consents are completed. TOC is aware ?Dilaudid 0.'5mg'$  IV once now. Increased frequency from q2h PRN to q1h PRN ?Continue amiodarone while tolerating - HR seems to be gradually declining as anticipated, can consider stopping tomorrow if he doesn't discharge today ?Continue other current comfort focused medication regimen  ?PMT will continue to follow and support holistically ? ? ?Goals of Care and Additional Recommendations: ?Limitations on Scope of Treatment: Full Comfort Care ? ?Code Status: ? ?  ?Code Status Orders  ?(From admission, onward)  ?  ? ? ?  ? ?  Start     Ordered  ? 10/23/21 1742  Do not attempt resuscitation (DNR)  Continuous       ?Question Answer Comment  ?In the event of cardiac or respiratory ARREST Do not call a ?code blue?   ?In the event of cardiac or respiratory ARREST Do not perform Intubation, CPR, defibrillation or ACLS   ?In the event of cardiac or respiratory ARREST Use medication by any route, position, wound care, and other measures to relive pain and suffering. May use oxygen, suction and manual treatment of airway obstruction as needed for comfort.   ?  ? 10/23/21 1747  ? ?  ?  ? ?  ? ?Code Status History   ? ? Date Active Date Inactive Code Status Order ID Comments User Context  ? 10/13/2021 1605 10/23/2021 1747 DNR [id number]  Lacinda Axon, MD ED  ? 09/07/2021 1443 09/17/2021 0223 DNR [id number]  Rosezella Rumpf, NP Inpatient  ? 08/30/2021 1726 09/07/2021 1443 Full Code [id number]  Orma Flaming, MD Inpatient  ? 08/12/2021 1553 08/30/2021 1641 Full Code [id number]  Barbie Banner, PA-C Inpatient  ? 08/05/2021 2020 08/12/2021 1321 Full Code [id number]  Emeterio Reeve, DO ED  ? 07/18/2021 7510 07/23/2021 2338 F

## 2021-10-25 NOTE — Progress Notes (Signed)
?   LOS: 12 days  ?Patient Summary: Scott Castro is a 66 year old male with a past medical history of paroxysmal atrial fibrillation on eliquis, ESRD on HD MWF (19 years) s/p failed kidney transplant, Barrett's esophagus, and critical limb ischemia leading to bilateral BKA and R AKA, who presented with a non-healing wounds leading to revision of L BKA to AKA and R AKA to higher R AKA. ?  ?Overnight Events: No acute overnight events. ? ?Interim History: Patient seen and evaluated at bedside. He was laying comfortably in bed eating breakfast. He reports feeling good today. He was unable to open his packet of honey and his cheerios due to weakness. He reported having increased leg pain. He said that his cousin might be coming by later this afternoon. Spoke with patient about spiritual matters. Provided emotional support. ? ?Objective: ?Vital signs in last 24 hours: ?Vitals:  ? 10/24/21 1137 10/24/21 1400 10/24/21 2136 10/25/21 0738  ?BP: (!) 99/59 (!) 59/42 (!) 102/44 (!) 90/51  ?Pulse: (!) 139 (!) 132 (!) 126 100  ?Resp: '16 18 20 18  '$ ?Temp: 98.4 ?F (36.9 ?C) 98 ?F (36.7 ?C) 97.8 ?F (36.6 ?C) 98.2 ?F (36.8 ?C)  ?TempSrc: Oral Oral Oral   ?SpO2: 93% 99% 94% 95%  ?Weight:      ?Height:      ? ?SpO2: 95 % ?Weight change:  ? ?Intake/Output Summary (Last 24 hours) at 10/25/2021 0849 ?Last data filed at 10/24/2021 1139 ?Gross per 24 hour  ?Intake 200 ml  ?Output --  ?Net 200 ml  ? ?General appearance: Chronically ill-appearing, malnourished man laying in bed. No acute distress. ?Heart: Tachycardic ?Abdomen: soft, non-tender; bowel sounds normal; no masses,  no organomegaly ?Skin: Diffuse ecchymoses on bilateral upper extremities  ?Psych: Flat affect.  ? ?Medications: ?Scheduled Meds: ? amiodarone  400 mg Oral BID  ? Followed by  ? [START ON 10/30/2021] amiodarone  200 mg Oral Daily  ? antiseptic oral rinse  15 mL Topical BID  ? Gerhardt's butt cream   Topical Daily  ? ?Continuous Infusions: ?PRN Meds:.acetaminophen **OR**  acetaminophen, [DISCONTINUED] glycopyrrolate **OR** glycopyrrolate **OR** glycopyrrolate, [DISCONTINUED] haloperidol **OR** haloperidol **OR** haloperidol lactate, HYDROmorphone (DILAUDID) injection, [DISCONTINUED] LORazepam **OR** LORazepam **OR** LORazepam, [DISCONTINUED] ondansetron **OR** ondansetron (ZOFRAN) IV, polyvinyl alcohol ?Assessment/Plan: ?Principal Problem: ?  Nonhealing surgical wound ?Active Problems: ?  ESRD on hemodialysis ([place]) ? ?Paroxysmal Atrial Fibrillation w/ RVR ?LV dysfunction EF 30-35%  ?Biventricular HF  ?Severe pulmonary HTN ?Cardiomyopathy ?ESRD on HD with anuria ?Secondary hyperparathyroidism ?Anasarca ?Scrotal edema ?Wound infection ?Hypotension ?#Comfort care ?Palliative care had a discussion with patient on 3/18 about hospice transfer. Patient and family shared about prior negative experience at rehab facility. After discussion, patient was agreeable to transfer to residential hospice facility (requested [facility]). Awaiting approval from [place] for [facility] eligibility. ?--Comfort care orders per palliative care, appreciate assistance ?--Transfer patient to [facility] when bed is available ?--Continue DNR order ?--Continue amiodarone as tolerated ? ?Prior to Admission Living Arrangement: SNF ?Anticipated Discharge Location: Residential hospice ?Barriers to Discharge: family meeting/placement ?Dispo: Anticipated discharge in approximately 1 day(s).  ? ?This is a Careers information officer Note.  The care of the patient was discussed with Dr. Marva Panda and the assessment and plan formulated with their assistance.  Please see their attached note for official documentation of the daily encounter. ? ?Nelva Nay, Medical Student ?10/25/2021, 8:49 AM ?Pager: [phone] ?After 5pm on weekdays and 1pm on weekends: On Call pager [phone] ? ? ?

## 2021-10-27 LAB — SEROTONIN RELEASE ASSAY (SRA)
SRA .2 IU/mL UFH Ser-aCnc: 1 % (ref 0–20)
SRA 100IU/mL UFH Ser-aCnc: 6 % (ref 0–20)

## 2021-11-07 DEATH — deceased

## 2021-11-12 NOTE — Progress Notes (Deleted)
?  POST OPERATIVE OFFICE NOTE ? ? ? ?CC:  F/u for surgery ? ?HPI:  This is a 66 y.o. male who is s/p revision of left BKA with conversion to to AKA and revision of right AKA to high AKA on 10/16/2021 by Dr. Donzetta Matters.  He has hx of ESRD and had been admitted to the hospital with BLE wounds that were not healing.   ? ?Pt returns today for follow up.  Pt states *** ? ?Allergies  ?Allergen Reactions  ? Heparin Anaphylaxis  ?  HIT antibody negative 3/14 ?SRA in process at time of discharge   ? ? ?Current Outpatient Medications  ?Medication Sig Dispense Refill  ? acetaminophen (TYLENOL) 325 MG tablet Take 2 tablets (650 mg total) by mouth every 6 (six) hours as needed for mild pain or moderate pain.    ? amiodarone (PACERONE) 400 MG tablet Take 1 tablet (400 mg total) by mouth 2 (two) times daily for 5 days, THEN 0.5 tablets (200 mg total) daily. 55 tablet 0  ? ?No current facility-administered medications for this visit.  ? ? ? ROS:  See HPI ? ?Physical Exam: ? ?*** ? ?Incision:  *** ?Extremities:  *** ? ? ? ?Assessment/Plan:  This is a 66 y.o. male who is s/p: ?evision of left BKA with conversion to to AKA and revision of right AKA to high AKA on 10/16/2021 by Dr. Donzetta Matters.   ? ?-*** ? ? ?Leontine Locket, PAC ?[facility] ?[phone] ? ? ?Clinic MD:  Scot Dock  ?

## 2022-06-07 ENCOUNTER — Encounter (INDEPENDENT_AMBULATORY_CARE_PROVIDER_SITE_OTHER): Payer: Self-pay
